# Patient Record
Sex: Male | Born: 1950
Health system: Southern US, Community
[De-identification: ages and names within clinical notes are randomized; demographics above are authoritative.]

## PROBLEM LIST (undated history)

## (undated) DIAGNOSIS — I10 Essential (primary) hypertension: Secondary | ICD-10-CM

## (undated) DIAGNOSIS — M1990 Unspecified osteoarthritis, unspecified site: Secondary | ICD-10-CM

## (undated) DIAGNOSIS — R911 Solitary pulmonary nodule: Secondary | ICD-10-CM

## (undated) DIAGNOSIS — E7800 Pure hypercholesterolemia, unspecified: Secondary | ICD-10-CM

## (undated) DIAGNOSIS — Z860101 Personal history of adenomatous and serrated colon polyps: Secondary | ICD-10-CM

## (undated) DIAGNOSIS — Z86010 Personal history of colonic polyps: Secondary | ICD-10-CM

## (undated) DIAGNOSIS — K5909 Other constipation: Secondary | ICD-10-CM

## (undated) DIAGNOSIS — N529 Male erectile dysfunction, unspecified: Secondary | ICD-10-CM

## (undated) DIAGNOSIS — Z8619 Personal history of other infectious and parasitic diseases: Secondary | ICD-10-CM

## (undated) DIAGNOSIS — R7303 Prediabetes: Secondary | ICD-10-CM

## (undated) DIAGNOSIS — Z960 Presence of urogenital implants: Secondary | ICD-10-CM

## (undated) DIAGNOSIS — K219 Gastro-esophageal reflux disease without esophagitis: Secondary | ICD-10-CM

## (undated) DIAGNOSIS — K449 Diaphragmatic hernia without obstruction or gangrene: Secondary | ICD-10-CM

## (undated) DIAGNOSIS — I700 Atherosclerosis of aorta: Secondary | ICD-10-CM

## (undated) DIAGNOSIS — K269 Duodenal ulcer, unspecified as acute or chronic, without hemorrhage or perforation: Secondary | ICD-10-CM

## (undated) DIAGNOSIS — N400 Enlarged prostate without lower urinary tract symptoms: Secondary | ICD-10-CM

## (undated) DIAGNOSIS — R339 Retention of urine, unspecified: Secondary | ICD-10-CM

## (undated) DIAGNOSIS — R002 Palpitations: Secondary | ICD-10-CM

## (undated) DIAGNOSIS — L719 Rosacea, unspecified: Secondary | ICD-10-CM

## (undated) DIAGNOSIS — F32A Depression, unspecified: Secondary | ICD-10-CM

## (undated) DIAGNOSIS — Z978 Presence of other specified devices: Secondary | ICD-10-CM

## (undated) DIAGNOSIS — F419 Anxiety disorder, unspecified: Secondary | ICD-10-CM

## (undated) DIAGNOSIS — I340 Nonrheumatic mitral (valve) insufficiency: Secondary | ICD-10-CM

## (undated) DIAGNOSIS — F1910 Other psychoactive substance abuse, uncomplicated: Secondary | ICD-10-CM

## (undated) DIAGNOSIS — K222 Esophageal obstruction: Secondary | ICD-10-CM

## (undated) DIAGNOSIS — I861 Scrotal varices: Secondary | ICD-10-CM

## (undated) DIAGNOSIS — F1020 Alcohol dependence, uncomplicated: Secondary | ICD-10-CM

## (undated) DIAGNOSIS — R0600 Dyspnea, unspecified: Secondary | ICD-10-CM

---

## 2000-12-24 ENCOUNTER — Ambulatory Visit (HOSPITAL_COMMUNITY): Admission: RE | Admit: 2000-12-24 | Discharge: 2000-12-24 | Payer: Self-pay | Admitting: Family Medicine

## 2000-12-24 ENCOUNTER — Encounter: Payer: Self-pay | Admitting: Family Medicine

## 2002-08-23 ENCOUNTER — Ambulatory Visit (HOSPITAL_BASED_OUTPATIENT_CLINIC_OR_DEPARTMENT_OTHER): Admission: RE | Admit: 2002-08-23 | Discharge: 2002-08-23 | Payer: Self-pay | Admitting: Family Medicine

## 2003-06-18 ENCOUNTER — Encounter (INDEPENDENT_AMBULATORY_CARE_PROVIDER_SITE_OTHER): Payer: Self-pay | Admitting: Specialist

## 2003-06-18 ENCOUNTER — Ambulatory Visit (HOSPITAL_COMMUNITY): Admission: RE | Admit: 2003-06-18 | Discharge: 2003-06-18 | Payer: Self-pay | Admitting: Gastroenterology

## 2006-11-21 ENCOUNTER — Ambulatory Visit (HOSPITAL_COMMUNITY): Admission: RE | Admit: 2006-11-21 | Discharge: 2006-11-21 | Payer: Self-pay | Admitting: Gastroenterology

## 2006-11-21 ENCOUNTER — Encounter (INDEPENDENT_AMBULATORY_CARE_PROVIDER_SITE_OTHER): Payer: Self-pay | Admitting: Gastroenterology

## 2008-08-25 ENCOUNTER — Encounter: Admission: RE | Admit: 2008-08-25 | Discharge: 2008-09-14 | Payer: Self-pay | Admitting: Family Medicine

## 2008-11-11 ENCOUNTER — Ambulatory Visit (HOSPITAL_COMMUNITY): Admission: RE | Admit: 2008-11-11 | Discharge: 2008-11-11 | Payer: Self-pay | Admitting: Gastroenterology

## 2008-11-11 ENCOUNTER — Encounter (INDEPENDENT_AMBULATORY_CARE_PROVIDER_SITE_OTHER): Payer: Self-pay | Admitting: Gastroenterology

## 2009-02-23 ENCOUNTER — Encounter: Admission: RE | Admit: 2009-02-23 | Discharge: 2009-04-07 | Payer: Self-pay | Admitting: Family Medicine

## 2009-03-19 ENCOUNTER — Encounter: Admission: RE | Admit: 2009-03-19 | Discharge: 2009-03-19 | Payer: Self-pay | Admitting: Family Medicine

## 2010-02-15 ENCOUNTER — Encounter: Admission: RE | Admit: 2010-02-15 | Discharge: 2010-02-15 | Payer: Self-pay | Admitting: Otolaryngology

## 2010-08-23 NOTE — Op Note (Signed)
NAME:  Ruben [name], Ruben [name] NO.:  [id number]   MEDICAL RECORD NO.:  [id number]          PATIENT TYPE:  AMB   LOCATION:  ENDO                         FACILITY:  [facility]   PHYSICIAN:  Danise Edge, M.D.   DATE OF BIRTH:  1950-12-07   DATE OF PROCEDURE:  11/11/2008  DATE OF DISCHARGE:                               OPERATIVE REPORT   REFERRING PHYSICIAN:  Vikki Ports, MD   HISTORY:  Mr. Ruben [name] is a 60 year old male born on 01/20/1951.  Mr. Ruben [name] has chronic gastroesophageal reflux and takes omeprazole each  morning.  He denies heartburn but does experience regurgitation.   In April 1995, his barium esophagram revealed a moderately large sliding  hiatal hernia and distal esophageal Schatzki ring.   In May 1995, his esophagogastroduodenoscopy revealed a Schatzki ring at  the esophagogastric junction which was dilated with the 18-mm Savary  dilator.  A healing 3-mm ulcer was present in the distal duodenal bulb.  His test for H. pylori gastritis was positive.  He received Pepto-  Bismol, tetracycline, and metronidazole to eradicate the H. pylori  gastritis.   In May 2005, the patient underwent a screening colonoscopy with removal  of a 2-mm tubular adenomatous polyp from the colon.   In August 2008, the patient underwent an esophagogastroduodenoscopy  which revealed a nonobstructing Schatzki ring at the esophagogastric  junction, a large hiatal hernia, and the gastric biopsies which were  positive for H. pylori gastritis.  He was treated with Protonix,  amoxicillin, and Biaxin.   The patient has redeveloped esophageal dysphagia, but denies heartburn.   CURRENT MEDICATION:  1. Benicar hydrochlorothiazide.  2. Prilosec 20 mg.   HABITS:  The patient does not smoke cigarettes.   MEDICATIONS ALLERGIES:  None.   PAST MEDICAL HISTORY:  1. Gastroesophageal reflux disease associated with hiatal hernia and      Schatzki ring.  2. Hypertension.   ENDOSCOPIST:  Danise Edge, MD   PREMEDICATION:  Versed 10 mg and fentanyl 100 mcg.   PROCEDURE IN DETAIL:  Esophagogastroduodenoscopy with Savary esophageal  dilation:  After obtaining informed consent, the patient was placed in  the left lateral decubitus position.  I administered intravenous  fentanyl and intravenous Versed to achieve conscious sedation for the  procedure.  The patient's blood pressure, oxygen saturation, and cardiac  rhythm were monitored throughout the procedure and documented in the  medical record.   The Pentax gastroscope was passed through the posterior hypopharynx into  the proximal esophagus without difficulty.  I did not visualize the  vocal cords.   Esophagoscopy:  The proximal mid and lower segments of the esophageal  mucosa appeared normal.  There is a shallow Schatzki ring at the  esophagogastric junction noted at approximately 35 cm from the incisor  teeth.  There is no endoscopic evidence for the presence of erosive  esophagitis or Barrett esophagus.   Gastroscopy:  There is a moderate-sized hiatal hernia.  Retroflex view  of the gastric cardia and fundus was normal.  The diaphragmatic hiatus  was quite patulous.  The gastric body, antrum,  and pylorus appeared  normal.  Biopsies were taken from the gastric antrum, gastric body, and  gastric cardia to look for H. pylori gastritis.  The pylorus is patent.   Duodenoscopy:  The duodenal bulb, second portion of duodenum, and third  portion of duodenum appeared normal.   Savary esophageal dilation:  The Savary dilator wire was passed through  the gastroscope and tip of the guidewire advanced to the distal gastric  antrum as confirmed endoscopically.  Fluoroscopy was not required for  esophageal dilation.  The 16-mm Savary dilator was passed without  resistance.  Repeat esophagogastroscopy revealed no mucosal tear of the  Schatzki ring at the esophagogastric junction and no gastric trauma due  to the  guidewire.   ASSESSMENT:  1. Gastroesophageal reflux disease associated with a moderate-sized      hiatal hernia and shallow Schatzki ring at the esophagogastric      junction.  No endoscopic evidence for the presence of erosive      esophagitis or Barrett esophagus.  2. Gastric biopsy to rule out Helicobacter pylori gastritis, pending.   SURVEILLANCE COLONOSCOPY WITH POLYPECTOMY TO PREVENT COLON CANCER:  Anal  inspection was normal.  Digital rectal exam revealed an enlarged  prostate.  The Pentax pediatric colonoscope was introduced into the  rectum and advanced to the cecum.  The patient had a colonic loop  formation making the procedure technically difficult.  In order to  intubate the cecum, the patient was rolled from the left lateral  decubitus position to the supine position with application of external  abdominal pressure.  Colonic preparation for the exam today was good.   Rectum normal.  Retroflex view of the distal rectum normal.  Sigmoid colon and descending colon.  At 50 cm from the anal verge, a 3-  mm sessile polyp was removed with cold biopsy forceps.  Splenic flexure normal.  Transverse colon normal.  Hepatic flexure normal.  Ascending colon.  From the proximal ascending colon, a 3-mm sessile  polyp was removed with cold biopsy forceps.  Cecum and ileocecal valve normal.   ASSESSMENT:  1. A 3-mm polyp was removed from the proximal ascending colon.  2. A 3-mm polyp was removed from the sigmoid colon at 50 cm from the      anal verge.  3. Enlarged prostate.   RECOMMENDATIONS:  Repeat surveillance colonoscopy in 5 years.           ______________________________  Danise Edge, M.D.     MJ/MEDQ  D:  11/11/2008  T:  11/12/2008  Job:  [id number]   cc:   Vikki Ports, M.D.

## 2010-08-23 NOTE — Op Note (Signed)
NAME:  Ruben [name], BART NO.:  [id number]   MEDICAL RECORD NO.:  [id number]          PATIENT TYPE:  AMB   LOCATION:  ENDO                         FACILITY:  [facility]   PHYSICIAN:  Danise Edge, M.D.   DATE OF BIRTH:  03-21-1951   DATE OF PROCEDURE:  11/21/2006  DATE OF DISCHARGE:                               OPERATIVE REPORT   PROCEDURE INDICATIONS:  Mr. ADOLPH CLUTTER is a 60 year old male born  May 25, 1950.  Mr. Helfand has chronic gastroesophageal reflux.  He takes  Prilosec each morning, which successfully controls his heartburn but  does not control his regurgitation.   His barium esophagram July 19, 1993 revealed a moderately large sliding  hiatal hernia and distal esophageal Schatzki's ring.   On Aug 09, 1993 his esophagogastroduodenoscopy revealed a Schatzki's ring  at the esophagogastric junction which was dilated with the 18-mm Savary  dilator.  A healing 3 mm ulcer was present in the distal duodenal bulb.  His CLO-test was positive for H pylori.  He received Pepto-Bismol,  tetracycline and Flagyl with his H2 blocker to eradicate H pylori  gastritis.   In March 2005 his screening proctocolonoscopy to the cecum resulted in  the removal of a 2-mm tubular adenomatous polyp from the left colon.   Mr. Ridley denies dysphagia and his heartburn is well controlled on  Prilosec.  He does experience regurgitation.   CHRONIC MEDICATIONS:  Prilosec, Benicar, hydrochlorothiazide.   MEDICATION ALLERGIES:  None.   PAST MEDICAL HISTORY:  Hypertension, gastroesophageal reflux   ENDOSCOPIST:  Danise Edge, M.D.   PREMEDICATION:  Fentanyl 75 mcg, Versed 6 mg.   PROCEDURE:  After obtaining informed consent Mr. Shippy was placed in the  left lateral decubitus position.  I administered intravenous fentanyl  and intravenous Versed to achieve conscious sedation for the procedure.  The patient's blood pressure, oxygen saturation and cardiac rhythm were  monitored  throughout the procedure and documented in the medical record.   The Pentax gastroscope was passed through the posterior hypopharynx into  the proximal esophagus without difficulty.  The hypopharynx, larynx and  vocal cords appeared normal.   Esophagoscopy:  The proximal mid and lower segments of the esophageal  mucosa appeared normal except for the presence of a non obstructing  Schatzki's ring at the esophagogastric junction which is noted at 37 cm  from the incisor teeth.  There is no endoscopic evidence for the  presence of Barrett's esophagus.   Gastroscopy:  Mr. Spellman has a large hiatal hernia.  Retroflex view of the  gastric cardia and fundus was normal.  The gastric body, antrum and  pylorus appeared normal.  Gastric biopsies were performed to rule out H  pylori gastritis.   Duodenoscopy:  The duodenal bulb, second portion of duodenum and third  portion of duodenum were normal.   Savary esophageal dilation:  The [employer] CRE esophageal  balloon dilating catheter was placed through the gastroscope.  Dilation  was accomplished with the 12-mm, 13.5-mm and 15-mm through the scope  balloon dilation.  ______________________________  Danise Edge, M.D.     MJ/MEDQ  D:  11/21/2006  T:  11/22/2006  Job:  [id number]   cc:   Vikki Ports, M.D.  Fax: [phone]

## 2011-06-24 DIAGNOSIS — L719 Rosacea, unspecified: Secondary | ICD-10-CM | POA: Insufficient documentation

## 2011-08-30 ENCOUNTER — Other Ambulatory Visit: Payer: Self-pay | Admitting: Gastroenterology

## 2012-12-16 ENCOUNTER — Other Ambulatory Visit: Payer: Self-pay | Admitting: Gastroenterology

## 2012-12-31 ENCOUNTER — Encounter (HOSPITAL_COMMUNITY)
Admission: RE | Disposition: A | Discharge status: Home / Self Care | Payer: Self-pay | Source: Ambulatory Visit | Attending: Gastroenterology

## 2012-12-31 ENCOUNTER — Ambulatory Visit (HOSPITAL_COMMUNITY)
Admission: RE | Admit: 2012-12-31 | Discharge: 2012-12-31 | Disposition: A | Discharge status: Home / Self Care | Payer: BC Managed Care – PPO | Source: Ambulatory Visit | Attending: Gastroenterology | Admitting: Gastroenterology

## 2012-12-31 ENCOUNTER — Encounter (HOSPITAL_COMMUNITY): Payer: Self-pay

## 2012-12-31 DIAGNOSIS — K219 Gastro-esophageal reflux disease without esophagitis: Secondary | ICD-10-CM | POA: Insufficient documentation

## 2012-12-31 DIAGNOSIS — I10 Essential (primary) hypertension: Secondary | ICD-10-CM | POA: Insufficient documentation

## 2012-12-31 DIAGNOSIS — R1310 Dysphagia, unspecified: Secondary | ICD-10-CM | POA: Insufficient documentation

## 2012-12-31 DIAGNOSIS — K222 Esophageal obstruction: Secondary | ICD-10-CM | POA: Insufficient documentation

## 2012-12-31 DIAGNOSIS — K449 Diaphragmatic hernia without obstruction or gangrene: Secondary | ICD-10-CM | POA: Insufficient documentation

## 2012-12-31 HISTORY — PX: ESOPHAGOGASTRODUODENOSCOPY (EGD) WITH ESOPHAGEAL DILATION: SHX5812

## 2012-12-31 HISTORY — DX: Essential (primary) hypertension: I10

## 2012-12-31 SURGERY — ESOPHAGOGASTRODUODENOSCOPY (EGD) WITH ESOPHAGEAL DILATION
Anesthesia: Moderate Sedation

## 2012-12-31 MED ORDER — SODIUM CHLORIDE 0.9 % IV SOLN
INTRAVENOUS | Status: DC
  Administered 2012-12-31: 500 mL via INTRAVENOUS

## 2012-12-31 MED ORDER — FENTANYL CITRATE 0.05 MG/ML IJ SOLN
INTRAMUSCULAR | Status: DC | PRN
  Administered 2012-12-31: 50 ug via INTRAVENOUS
  Administered 2012-12-31: 25 ug via INTRAVENOUS

## 2012-12-31 MED ORDER — FENTANYL CITRATE 0.05 MG/ML IJ SOLN
INTRAMUSCULAR | Status: AC
  Filled 2012-12-31: qty 2

## 2012-12-31 MED ORDER — MIDAZOLAM HCL 10 MG/2ML IJ SOLN
INTRAMUSCULAR | Status: DC | PRN
  Administered 2012-12-31 (×3): 2.5 mg via INTRAVENOUS

## 2012-12-31 MED ORDER — BUTAMBEN-TETRACAINE-BENZOCAINE 2-2-14 % EX AERO
INHALATION_SPRAY | CUTANEOUS | Status: DC | PRN
  Administered 2012-12-31: 2 via TOPICAL

## 2012-12-31 MED ORDER — MIDAZOLAM HCL 10 MG/2ML IJ SOLN
INTRAMUSCULAR | Status: AC
  Filled 2012-12-31: qty 2

## 2012-12-31 NOTE — Op Note (Signed)
Problem: Chronic esophageal dysphagia  Endoscopist: Danise Edge  Premedication: Versed 7.5 mg intravenously. Fentanyl 75 mcg intravenously.  Procedure: Diagnostic esophagogastroduodenoscopy with balloon dilation of a benign distal esophageal stricture The patient was placed in the left lateral decubitus position. The Pentax gastroscope was passed through the posterior hypopharynx into the proximal esophagus without difficulty. The vocal cords were not visualized.  Esophagoscopy: The proximal and mid segments of the esophageal mucosa appeared normal. The esophagogastric junction wsa noted at 40 cm from the incisor teeth. There was a shallow benign stricture at the esophagogastric junction less than 1 cm in length. Using the esophageal balloon dilator, the stricture was dilated to 18 mm without apparent complications. There was no endoscopic evidence for the presence of erosive esophagitis or Barrett's esophagus.  Gastroscopy: There was a moderate-sized hiatal hernia. Retroflexed view of the gastric cardia and fundus was normal. The gastric body, antrum, and pylorus appeared normal. There was a small amount of solid food in the gastric body indicating gastroparesis.  Duodenoscopy: The duodenal bulb and descending duodenum appeared normal.  Assessment: Chronic gastroesophageal reflux associated with a moderate-sized hiatal hernia And benign stricture at the esophagogastric junction dilated to 18 mm using the esophageal balloon dilator.  Recommendations: If esophageal dysphagia persists post esophageal dilation, scheduled diagnostic esophageal manometry to rule out achalasia

## 2012-12-31 NOTE — H&P (Signed)
  Problem: Dysphagia secondary to a benign distal esophageal stricture  History: The patient is a 62 year old male born March 12, 1951. The patient has chronic esophageal dysphagia. In May 2013, his esophagogastroduodenoscopy showed a benign stricture at the esophagogastric junction which was dilated to 18 mm.  Despite esophageal dilation and chronic proton pump inhibitor therapy, the patient continues to experience intermittent solid food and liquid esophageal dysphagia without odynophagia.  Patient is scheduled to undergo a diagnostic esophagogastroduodenoscopy with dilation of the benign distal esophageal stricture.  Past medical history: Hypertension. Gastroesophageal reflux associated with a hiatal hernia and benign distal esophageal stricture. History of duodenal ulcer. Adenomatous colon polyp removed colonoscopically in 2010. Benign prostatic hypertrophy.  Exam: The patient is alert and lying comfortably on the endoscopy stretcher. Abdomen is soft and nontender to palpation. Lungs are clear to auscultation. Cardiac exam reveals a regular rhythm.  Plan: Proceed with diagnostic esophagogastroduodenoscopy and dilation of the benign distal esophageal stricture. If esophageal dysphagia persists following esophageal dilation, scheduled diagnostic esophageal manometry.

## 2013-01-01 ENCOUNTER — Encounter (HOSPITAL_COMMUNITY): Payer: Self-pay | Admitting: Gastroenterology

## 2014-02-23 ENCOUNTER — Other Ambulatory Visit: Payer: Self-pay | Admitting: Family Medicine

## 2014-02-23 DIAGNOSIS — N5089 Other specified disorders of the male genital organs: Secondary | ICD-10-CM

## 2014-02-24 ENCOUNTER — Ambulatory Visit
Admission: RE | Admit: 2014-02-24 | Discharge: 2014-02-24 | Disposition: A | Discharge status: Home / Self Care | Payer: BC Managed Care – PPO | Source: Ambulatory Visit | Attending: Family Medicine | Admitting: Family Medicine

## 2014-02-24 DIAGNOSIS — N5089 Other specified disorders of the male genital organs: Secondary | ICD-10-CM

## 2014-02-25 DIAGNOSIS — I861 Scrotal varices: Secondary | ICD-10-CM

## 2014-02-25 HISTORY — DX: Scrotal varices: I86.1

## 2014-06-03 ENCOUNTER — Other Ambulatory Visit: Payer: Self-pay | Admitting: Gastroenterology

## 2014-06-04 ENCOUNTER — Encounter (HOSPITAL_COMMUNITY): Payer: Self-pay | Admitting: *Deleted

## 2014-06-08 ENCOUNTER — Ambulatory Visit (HOSPITAL_COMMUNITY): Payer: BLUE CROSS/BLUE SHIELD | Admitting: Anesthesiology

## 2014-06-08 ENCOUNTER — Encounter (HOSPITAL_COMMUNITY)
Admission: RE | Disposition: A | Discharge status: Home / Self Care | Payer: Self-pay | Source: Ambulatory Visit | Attending: Gastroenterology

## 2014-06-08 ENCOUNTER — Ambulatory Visit (HOSPITAL_COMMUNITY)
Admission: RE | Admit: 2014-06-08 | Discharge: 2014-06-08 | Disposition: A | Discharge status: Home / Self Care | Payer: BLUE CROSS/BLUE SHIELD | Source: Ambulatory Visit | Attending: Gastroenterology | Admitting: Gastroenterology

## 2014-06-08 ENCOUNTER — Encounter (HOSPITAL_COMMUNITY): Payer: Self-pay | Admitting: Gastroenterology

## 2014-06-08 DIAGNOSIS — I10 Essential (primary) hypertension: Secondary | ICD-10-CM | POA: Diagnosis not present

## 2014-06-08 DIAGNOSIS — K449 Diaphragmatic hernia without obstruction or gangrene: Secondary | ICD-10-CM | POA: Insufficient documentation

## 2014-06-08 DIAGNOSIS — K219 Gastro-esophageal reflux disease without esophagitis: Secondary | ICD-10-CM | POA: Diagnosis not present

## 2014-06-08 DIAGNOSIS — Z1211 Encounter for screening for malignant neoplasm of colon: Secondary | ICD-10-CM | POA: Insufficient documentation

## 2014-06-08 DIAGNOSIS — K635 Polyp of colon: Secondary | ICD-10-CM | POA: Insufficient documentation

## 2014-06-08 DIAGNOSIS — K222 Esophageal obstruction: Secondary | ICD-10-CM | POA: Diagnosis present

## 2014-06-08 HISTORY — PX: COLONOSCOPY WITH PROPOFOL: SHX5780

## 2014-06-08 HISTORY — PX: ESOPHAGOGASTRODUODENOSCOPY: SHX5428

## 2014-06-08 HISTORY — PX: BALLOON DILATION: SHX5330

## 2014-06-08 SURGERY — EGD (ESOPHAGOGASTRODUODENOSCOPY)
Anesthesia: Monitor Anesthesia Care

## 2014-06-08 MED ORDER — LACTATED RINGERS IV SOLN
INTRAVENOUS | Status: DC
  Administered 2014-06-08: 1000 mL via INTRAVENOUS

## 2014-06-08 MED ORDER — SODIUM CHLORIDE 0.9 % IV SOLN: INTRAVENOUS | Status: DC

## 2014-06-08 MED ORDER — PROPOFOL 10 MG/ML IV BOLUS
INTRAVENOUS | Status: AC
  Filled 2014-06-08: qty 20

## 2014-06-08 MED ORDER — LACTATED RINGERS IV SOLN
INTRAVENOUS | Status: DC | PRN
  Administered 2014-06-08 (×2): via INTRAVENOUS

## 2014-06-08 MED ORDER — PROPOFOL 10 MG/ML IV BOLUS
INTRAVENOUS | Status: AC
  Filled 2014-06-08: qty 60

## 2014-06-08 MED ORDER — PROPOFOL 10 MG/ML IV BOLUS
INTRAVENOUS | Status: DC | PRN
  Administered 2014-06-08 (×2): 100 mg via INTRAVENOUS
  Administered 2014-06-08: 75 mg via INTRAVENOUS
  Administered 2014-06-08: 100 mg via INTRAVENOUS
  Administered 2014-06-08: 25 mg via INTRAVENOUS
  Administered 2014-06-08 (×2): 50 mg via INTRAVENOUS
  Administered 2014-06-08: 25 mg via INTRAVENOUS

## 2014-06-08 SURGICAL SUPPLY — 22 items
ELECT REM PT RETURN 9FT ADLT (ELECTROSURGICAL) IMPLANT
ELECTRODE REM PT RTRN 9FT ADLT (ELECTROSURGICAL) IMPLANT
FCP BXJMBJMB 240X2.8X (CUTTING FORCEPS)
FLOOR PAD 36X40 (MISCELLANEOUS) ×2 IMPLANT
FORCEPS BIOP RAD 4 LRG CAP 4 (CUTTING FORCEPS) IMPLANT
FORCEPS BIOP RJ4 240 W/NDL (CUTTING FORCEPS) IMPLANT
FORCEPS BXJMBJMB 240X2.8X (CUTTING FORCEPS) IMPLANT
INJECTOR/SNARE I SNARE (MISCELLANEOUS) IMPLANT
LUBRICANT JELLY 4.5OZ STERILE (MISCELLANEOUS) IMPLANT
MANIFOLD NEPTUNE II (INSTRUMENTS) IMPLANT
NDL SCLEROTHERAPY 25GX240 (NEEDLE) IMPLANT
NEEDLE SCLEROTHERAPY 25GX240 (NEEDLE) IMPLANT
PAD FLOOR 36X40 (MISCELLANEOUS) ×1 IMPLANT
PROBE APC STR FIRE (PROBE) IMPLANT
PROBE INJECTION GOLD (MISCELLANEOUS)
PROBE INJECTION GOLD 7FR (MISCELLANEOUS) IMPLANT
SNARE ROTATE MED OVAL 20MM (MISCELLANEOUS) IMPLANT
SYR 50ML LL SCALE MARK (SYRINGE) IMPLANT
TRAP SPECIMEN MUCOUS 40CC (MISCELLANEOUS) IMPLANT
TUBING ENDO SMARTCAP PENTAX (MISCELLANEOUS) IMPLANT
TUBING IRRIGATION ENDOGATOR (MISCELLANEOUS) ×2 IMPLANT
WATER STERILE IRR 1000ML POUR (IV SOLUTION) IMPLANT

## 2014-06-08 NOTE — Transfer of Care (Signed)
Immediate Anesthesia Transfer of Care Note  Patient: [name]  Procedure(s) Performed: Procedure(s): ESOPHAGOGASTRODUODENOSCOPY (EGD) (N/A) BALLOON DILATION (N/A) COLONOSCOPY WITH PROPOFOL (N/A)  Patient Location: PACU and Endoscopy Unit  Anesthesia Type:MAC  Level of Consciousness: awake, sedated and patient cooperative  Airway & Oxygen Therapy: Patient Spontanous Breathing and Patient connected to face mask oxygen  Post-op Assessment: Report given to RN and Post -op Vital signs reviewed and stable  Post vital signs: Reviewed and stable  Last Vitals:  Filed Vitals:   06/08/14 1252  BP: 154/41  Pulse: 95  Temp: 36.8 C  Resp: 11    Complications: No apparent anesthesia complications

## 2014-06-08 NOTE — H&P (Signed)
Problem: Benign distal esophageal stricture dilated in 2014. History of adenomatous colon polyps removed colonoscopically.  History: The patient is a 64 year old male born 10/13/1960. He is scheduled to undergo esophagogastroduodenoscopy with dilation of the benign distal esophageal stricture and a surveillance colonoscopy today.  Past medical history: Hypertension. Gastroesophageal reflux complicated by a benign distal esophageal stricture. Treated H. pylori gastritis. Adenomatous colon polyps removed colonoscopically in the past. Benign prostatic hypertrophy.  Exam: The patient is alert and lying comfortably on the endoscopy stretcher. Cardiac exam reveals a regular rhythm. Abdomen is soft and nontender to palpation. Lungs are clear to auscultation.  Plan: Proceed with esophagogastroduodenoscopy with benign distal esophageal stricture dilation and surveillance colonoscopy

## 2014-06-08 NOTE — Discharge Instructions (Signed)
Esophagogastroduodenoscopy °Care After °Refer to this sheet in the next few weeks. These instructions provide you with information on caring for yourself after your procedure. Your caregiver may also give you more specific instructions. Your treatment has been planned according to current medical practices, but problems sometimes occur. Call your caregiver if you have any problems or questions after your procedure.  °HOME CARE INSTRUCTIONS °· Do not eat or drink anything until the numbing medicine (local anesthetic) has worn off and your gag reflex has returned. You will know that the local anesthetic has worn off when you can swallow comfortably. °· Do not drive for 12 hours after the procedure or as directed by your caregiver. °· Only take medicines as directed by your caregiver. °SEEK MEDICAL CARE IF:  °· You cannot stop coughing. °· You are not urinating at all or less than usual. °SEEK IMMEDIATE MEDICAL CARE IF: °· You have difficulty swallowing. °· You cannot eat or drink. °· You have worsening throat or chest pain. °· You have dizziness, lightheadedness, or you faint. °· You have nausea or vomiting. °· You have chills. °· You have a fever. °· You have severe abdominal pain. °· You have black, tarry, or bloody stools. °Document Released: 03/13/2012 Document Reviewed: 03/13/2012 °[employer]® Patient Information ©2015 [employer]. This information is not intended to replace advice given to you by your health care provider. Make sure you discuss any questions you have with your health care provider. ° °

## 2014-06-08 NOTE — Anesthesia Postprocedure Evaluation (Signed)
  Anesthesia Post-op Note  Patient: Ruben [name]  Procedure(s) Performed: Procedure(s): ESOPHAGOGASTRODUODENOSCOPY (EGD) (N/A) BALLOON DILATION (N/A) COLONOSCOPY WITH PROPOFOL (N/A)  Patient Location: PACU  Anesthesia Type:MAC  Level of Consciousness: awake and alert   Airway and Oxygen Therapy: Patient Spontanous Breathing  Post-op Pain: none  Post-op Assessment: Post-op Vital signs reviewed  Post-op Vital Signs: Reviewed  Last Vitals:  Filed Vitals:   06/08/14 1440  BP: 152/84  Pulse: 89  Temp:   Resp: 17    Complications: No apparent anesthesia complications

## 2014-06-08 NOTE — Anesthesia Preprocedure Evaluation (Addendum)
Anesthesia Evaluation  Patient identified by MRN, date of birth, ID band Patient awake    Reviewed: Allergy & Precautions, NPO status , Patient's Chart, lab work & pertinent test results  Airway Mallampati: III  TM Distance: >3 FB Neck ROM: Full    Dental  (+) Teeth Intact   Pulmonary neg pulmonary ROS,  breath sounds clear to auscultation        Cardiovascular hypertension, Pt. on medications Rhythm:Regular Rate:Normal     Neuro/Psych negative neurological ROS     GI/Hepatic negative GI ROS, Neg liver ROS,   Endo/Other  negative endocrine ROS  Renal/GU negative Renal ROS     Musculoskeletal   Abdominal   Peds  Hematology negative hematology ROS (+)   Anesthesia Other Findings   Reproductive/Obstetrics                            Anesthesia Physical Anesthesia Plan  ASA: II  Anesthesia Plan: MAC   Post-op Pain Management:    Induction: Intravenous  Airway Management Planned: Natural Airway and Nasal Cannula  Additional Equipment:   Intra-op Plan:   Post-operative Plan:   Informed Consent: I have reviewed the patients History and Physical, chart, labs and discussed the procedure including the risks, benefits and alternatives for the proposed anesthesia with the patient or authorized representative who has indicated his/her understanding and acceptance.     Plan Discussed with: CRNA  Anesthesia Plan Comments:         Anesthesia Quick Evaluation

## 2014-06-08 NOTE — Op Note (Signed)
Problems: Benign distal esophageal stricture dilated in 2014. Giant hiatal hernia. History of adenomatous colon polyps removed colonoscopically.  Endoscopist: Earle Gell  Premedication: Propofol administered by anesthesia  Procedure: Diagnostic esophagogastroduodenoscopy The patient was placed in the left lateral decubitus position. The Pentax gastroscope was passed through the posterior hypopharynx into the proximal esophagus without difficulty. I did not visualize the vocal cords.  Esophagoscopy: The proximal, mid, and lower segments of the esophageal mucosa appeared normal. The squamocolumnar junction was noted at 40 cm from the incisor teeth. No definite stricture formation was noted. Using the esophageal balloon dilator, the balloon was inflated at the esophagogastric junction from 15 mm to  16.5 mm. Inspection of the mucosa at the esophagogastric junction after deflating balloon did not show esophageal mucosal disruption indicating the absence of a significant esophageal stricture.  Gastroscopy: The patient has a giant hiatal hernia. Retroflexed view of the gastric cardia and fundus was normal. The gastric body, antrum, and pylorus appeared normal. There were no gastric erosions at the diaphragmatic hiatus which was noted at the mid gastric antrum.  Duodenoscopy: The duodenal bulb and descending duodenum appeared normal.  Assessment: Normal esophagogastroduodenoscopy except for the presence of a giant hiatal hernia  Procedure: Screening colonoscopy. Anal inspection and digital rectal exam were normal. The Pentax pediatric colonoscope was introduced into the rectum and advanced to the cecum. A normal-appearing appendiceal orifice was identified. A normal-appearing ileocecal valve was identified. Colonic preparation for the exam was good after vigorous water lavage of the colonic mucosa. Withdrawal time was 18 minutes  Rectum. Normal. Retroflexed view of the distal rectum  normal  Sigmoid colon and descending colon. Normal  Splenic flexure. Normal  Transverse colon. Normal  Hepatic flexure. Normal  Ascending colon. A 3 mm sessile polyp was removed from the proximal ascending colon with the cold biopsy forceps  Cecum and ileocecal valve. Normal  Assessment: A 3 mm sessile polyp was removed from the proximal ascending colon. Otherwise normal colonoscopy.  Recommendation: Schedule surveillance colonoscopy in 5 years

## 2014-06-09 ENCOUNTER — Encounter (HOSPITAL_COMMUNITY): Payer: Self-pay | Admitting: Gastroenterology

## 2015-08-19 DIAGNOSIS — R918 Other nonspecific abnormal finding of lung field: Secondary | ICD-10-CM | POA: Diagnosis not present

## 2015-08-19 DIAGNOSIS — J069 Acute upper respiratory infection, unspecified: Secondary | ICD-10-CM | POA: Diagnosis not present

## 2015-11-28 ENCOUNTER — Encounter (HOSPITAL_BASED_OUTPATIENT_CLINIC_OR_DEPARTMENT_OTHER): Payer: Self-pay | Admitting: *Deleted

## 2015-11-28 ENCOUNTER — Emergency Department (HOSPITAL_BASED_OUTPATIENT_CLINIC_OR_DEPARTMENT_OTHER)
Admission: EM | Admit: 2015-11-28 | Discharge: 2015-11-28 | Disposition: A | Discharge status: Home / Self Care | Payer: Managed Care, Other (non HMO) | Attending: Emergency Medicine | Admitting: Emergency Medicine

## 2015-11-28 DIAGNOSIS — R509 Fever, unspecified: Secondary | ICD-10-CM | POA: Diagnosis present

## 2015-11-28 DIAGNOSIS — E871 Hypo-osmolality and hyponatremia: Secondary | ICD-10-CM | POA: Diagnosis not present

## 2015-11-28 DIAGNOSIS — Z79899 Other long term (current) drug therapy: Secondary | ICD-10-CM | POA: Insufficient documentation

## 2015-11-28 DIAGNOSIS — I10 Essential (primary) hypertension: Secondary | ICD-10-CM | POA: Diagnosis not present

## 2015-11-28 DIAGNOSIS — N390 Urinary tract infection, site not specified: Secondary | ICD-10-CM | POA: Diagnosis not present

## 2015-11-28 LAB — COMPREHENSIVE METABOLIC PANEL
ALT: 23 U/L (ref 17–63)
ANION GAP: 8 (ref 5–15)
AST: 23 U/L (ref 15–41)
Albumin: 3.5 g/dL (ref 3.5–5.0)
Alkaline Phosphatase: 54 U/L (ref 38–126)
BUN: 26 mg/dL — ABNORMAL HIGH (ref 6–20)
CHLORIDE: 99 mmol/L — AB (ref 101–111)
CO2: 22 mmol/L (ref 22–32)
Calcium: 8.3 mg/dL — ABNORMAL LOW (ref 8.9–10.3)
Creatinine, Ser: 1.32 mg/dL — ABNORMAL HIGH (ref 0.61–1.24)
GFR, EST NON AFRICAN AMERICAN: 55 mL/min — AB (ref >60)
Glucose, Bld: 132 mg/dL — ABNORMAL HIGH (ref 65–99)
POTASSIUM: 4 mmol/L (ref 3.5–5.1)
Sodium: 129 mmol/L — ABNORMAL LOW (ref 135–145)
Total Bilirubin: 0.8 mg/dL (ref 0.3–1.2)
Total Protein: 7 g/dL (ref 6.5–8.1)

## 2015-11-28 LAB — URINE MICROSCOPIC-ADD ON

## 2015-11-28 LAB — URINALYSIS, ROUTINE W REFLEX MICROSCOPIC
BILIRUBIN URINE: NEGATIVE
Glucose, UA: NEGATIVE mg/dL
Ketones, ur: NEGATIVE mg/dL
Nitrite: POSITIVE — AB
PROTEIN: 100 mg/dL — AB
Specific Gravity, Urine: 1.022 (ref 1.005–1.030)
pH: 5.5 (ref 5.0–8.0)

## 2015-11-28 LAB — CBC WITH DIFFERENTIAL/PLATELET
Basophils Absolute: 0 10*3/uL (ref 0.0–0.1)
Basophils Relative: 0 %
Eosinophils Absolute: 0.1 10*3/uL (ref 0.0–0.7)
Eosinophils Relative: 1 %
HEMATOCRIT: 40 % (ref 39.0–52.0)
HEMOGLOBIN: 14.1 g/dL (ref 13.0–17.0)
LYMPHS ABS: 0.2 10*3/uL — AB (ref 0.7–4.0)
Lymphocytes Relative: 2 %
MCH: 31.3 pg (ref 26.0–34.0)
MCHC: 35.3 g/dL (ref 30.0–36.0)
MCV: 88.9 fL (ref 78.0–100.0)
MONOS PCT: 11 %
Monocytes Absolute: 1.2 10*3/uL — ABNORMAL HIGH (ref 0.1–1.0)
NEUTROS ABS: 9.3 10*3/uL — AB (ref 1.7–7.7)
NEUTROS PCT: 86 %
Platelets: 175 10*3/uL (ref 150–400)
RBC: 4.5 MIL/uL (ref 4.22–5.81)
RDW: 12.7 % (ref 11.5–15.5)
WBC: 10.8 10*3/uL — AB (ref 4.0–10.5)

## 2015-11-28 LAB — I-STAT CG4 LACTIC ACID, ED: LACTIC ACID, VENOUS: 1.15 mmol/L (ref 0.5–1.9)

## 2015-11-28 MED ORDER — CIPROFLOXACIN HCL 500 MG PO TABS: 500.0000 mg | ORAL_TABLET | Freq: Two times a day (BID) | ORAL | 0 refills | Status: DC

## 2015-11-28 MED ORDER — CEFTRIAXONE SODIUM 1 G IJ SOLR
1.0000 g | Freq: Once | INTRAMUSCULAR | Status: AC
  Administered 2015-11-28: 1 g via INTRAVENOUS
  Filled 2015-11-28: qty 10

## 2015-11-28 MED ORDER — ACETAMINOPHEN 325 MG PO TABS
650.0000 mg | ORAL_TABLET | Freq: Once | ORAL | Status: AC
  Administered 2015-11-28: 650 mg via ORAL
  Filled 2015-11-28: qty 2

## 2015-11-28 MED ORDER — SODIUM CHLORIDE 0.9 % IV BOLUS (SEPSIS)
500.0000 mL | Freq: Once | INTRAVENOUS | Status: AC
  Administered 2015-11-28: 500 mL via INTRAVENOUS

## 2015-11-28 NOTE — ED Notes (Signed)
Pt given d/c instructions. Rx x 1 Verbalizes understanding. No questions. 

## 2015-11-28 NOTE — ED Provider Notes (Signed)
Ruben [name] DEPT Provider Note   CSN: [id number] Arrival date & time: 11/28/15  1620 By signing my name below, I, Ruben [name], attest that this documentation has been prepared under the direction and in the presence of No att. providers found . Electronically Signed: Dyke [name], Scribe. 11/28/2015. 6:43 PM.    History   Chief Complaint Chief Complaint  Patient presents with  . Fever    HPI [name] is a 65 y.o. male with hx of enlarged prostate and HTN who presents to the Emergency Department complaining of fever which he developed this morning. He also complains of mild, burning penile pain. Pt had catheter placed about 4 weeks ago because he was unable to empty his bladder. His catheter was last changed three days ago. He is currently being followed at [facility] urology and states he is to have surgery to correct his prostate. Pt denies any severe pain.  The history is provided by the patient. No language interpreter was used.    Past Medical History:  Diagnosis Date  . Enlarged prostate   . Hypertension     There are no active problems to display for this patient.   Past Surgical History:  Procedure Laterality Date  . BALLOON DILATION N/A 06/08/2014   Procedure: BALLOON DILATION;  Surgeon: Garlan Fair, MD;  Location: [facility];  Service: Endoscopy;  Laterality: N/A;  . COLONOSCOPY WITH PROPOFOL N/A 06/08/2014   Procedure: COLONOSCOPY WITH PROPOFOL;  Surgeon: Garlan Fair, MD;  Location: [facility];  Service: Endoscopy;  Laterality: N/A;  . ESOPHAGOGASTRODUODENOSCOPY N/A 06/08/2014   Procedure: ESOPHAGOGASTRODUODENOSCOPY (EGD);  Surgeon: Garlan Fair, MD;  Location: [facility];  Service: Endoscopy;  Laterality: N/A;  . ESOPHAGOGASTRODUODENOSCOPY (EGD) WITH ESOPHAGEAL DILATION N/A 12/31/2012   Procedure: ESOPHAGOGASTRODUODENOSCOPY (EGD) WITH ESOPHAGEAL DILATION;  Surgeon: Garlan Fair, MD;  Location: [facility];  Service: Endoscopy;   Laterality: N/A;     Home Medications    Prior to Admission medications   Medication Sig Start Date End Date Taking? Authorizing Provider  Calcium-Magnesium-Vitamin D (CALCIUM MAGNESIUM PO) Take 1 tablet by mouth daily.    Historical Provider, MD  ciprofloxacin (CIPRO) 500 MG tablet Take 1 tablet (500 mg total) by mouth every 12 (twelve) hours. 11/28/15   [name] Schwartz, MD  ibuprofen (ADVIL,MOTRIN) 200 MG tablet Take 400 mg by mouth every 6 (six) hours as needed for headache or moderate pain.    Historical Provider, MD  lisinopril-hydrochlorothiazide (PRINZIDE,ZESTORETIC) 20-12.5 MG per tablet Take 1 tablet by mouth daily.    Historical Provider, MD  MILK THISTLE PO Take 1 tablet by mouth daily.    Historical Provider, MD  omeprazole (PRILOSEC) 20 MG capsule Take 20 mg by mouth daily.    Historical Provider, MD  UBIQUINOL PO Take 1 tablet by mouth daily.    Historical Provider, MD    Family History History reviewed. No pertinent family history.  Social History Social History  Substance Use Topics  . Smoking status: Never Smoker  . Smokeless tobacco: Never Used  . Alcohol use Yes    Allergies   Review of patient's allergies indicates no known allergies.  Review of Systems Review of Systems 10 Systems reviewed and are negative for acute change except as noted in the HPI.   Physical Exam Updated Vital Signs BP 112/79 (BP Location: Left Arm)   Pulse 102   Temp 99.1 F (37.3 C)   Resp 20   Ht 5\' 11"  (1.803 m)   Wt 186  lb (84.4 kg)   SpO2 96%   BMI 25.94 kg/m   Physical Exam Physical Exam  Nursing note and vitals reviewed. Constitutional: He is oriented to person, place, and time. He appears well-developed and well-nourished. No distress.  HENT:  Head: Normocephalic and atraumatic.  Eyes: Pupils are equal, round, and reactive to light.  Neck: Normal range of motion.  Cardiovascular: Normal rate and intact distal pulses.   Pulmonary/Chest: No respiratory distress.    Abdominal: Normal appearance. He exhibits no distension.  Musculoskeletal: Normal range of motion.  Neurological: He is alert and oriented to person, place, and time. No cranial nerve deficit.  Skin: Skin is warm and dry. No rash noted.  Psychiatric: He has a normal mood and affect. His behavior is normal.   ED Treatments / Results  DIAGNOSTIC STUDIES:  Oxygen Saturation is 95% on RA, normal by my interpretation.    COORDINATION OF CARE:  6:37 PM Will start rocephin. Discussed treatment plan with pt at bedside and pt agreed to plan.   Labs (all labs ordered are listed, but only abnormal results are displayed) Labs Reviewed  URINE CULTURE - Abnormal; Notable for the following:       Result Value   Culture   (*)    Value: >=100,000 COLONIES/mL ESCHERICHIA COLI 70,000 COLONIES/mL PSEUDOMONAS AERUGINOSA    Organism ID, Bacteria ESCHERICHIA COLI (*)    Organism ID, Bacteria PSEUDOMONAS AERUGINOSA (*)    All other components within normal limits  URINALYSIS, ROUTINE W REFLEX MICROSCOPIC (NOT AT [facility]) - Abnormal; Notable for the following:    Color, Urine ORANGE (*)    APPearance CLOUDY (*)    Hgb urine dipstick MODERATE (*)    Protein, ur 100 (*)    Nitrite POSITIVE (*)    Leukocytes, UA MODERATE (*)    All other components within normal limits  COMPREHENSIVE METABOLIC PANEL - Abnormal; Notable for the following:    Sodium 129 (*)    Chloride 99 (*)    Glucose, Bld 132 (*)    BUN 26 (*)    Creatinine, Ser 1.32 (*)    Calcium 8.3 (*)    GFR calc non Af Amer 55 (*)    All other components within normal limits  CBC WITH DIFFERENTIAL/PLATELET - Abnormal; Notable for the following:    WBC 10.8 (*)    Neutro Abs 9.3 (*)    Lymphs Abs 0.2 (*)    Monocytes Absolute 1.2 (*)    All other components within normal limits  URINE MICROSCOPIC-ADD ON - Abnormal; Notable for the following:    Squamous Epithelial / LPF 0-5 (*)    Bacteria, UA MANY (*)    All other components within  normal limits  CULTURE, BLOOD (ROUTINE X 2)  CULTURE, BLOOD (ROUTINE X 2)  I-STAT CG4 LACTIC ACID, ED    EKG  EKG Interpretation None       Radiology No results found.  Procedures Procedures (including critical care time)  Medications Ordered in ED Medications  acetaminophen (TYLENOL) tablet 650 mg (650 mg Oral Given 11/28/15 1656)  cefTRIAXone (ROCEPHIN) 1 g in dextrose 5 % 50 mL IVPB (0 g Intravenous Stopped 11/28/15 1910)  sodium chloride 0.9 % bolus 500 mL (0 mLs Intravenous Stopped 11/28/15 1935)    Initial Impression / Assessment and Plan / ED Course  I have reviewed the triage vital signs and the nursing notes.  Pertinent labs & imaging results that were available during my care of the patient  were reviewed by me and considered in my medical decision making (see chart for details).  Clinical Course     Final Clinical Impressions(s) / ED Diagnoses   Final diagnoses:  UTI (lower urinary tract infection)  Hyponatremia  I personally performed the services described in this documentation, which was scribed in my presence. The recorded information has been reviewed and considered.   New Prescriptions Discharge Medication List as of 11/28/2015  7:10 PM    START taking these medications   Details  ciprofloxacin (CIPRO) 500 MG tablet Take 1 tablet (500 mg total) by mouth every 12 (twelve) hours., Starting Sun 11/28/2015, Print         [name] Schwartz, MD 12/04/15 [phone]

## 2015-11-28 NOTE — ED Triage Notes (Signed)
Pt has had cath x 3-4 weeks and last night developed fever, chills and sweats. Feel woozy at times.

## 2015-12-01 LAB — URINE CULTURE

## 2015-12-02 ENCOUNTER — Telehealth (HOSPITAL_BASED_OUTPATIENT_CLINIC_OR_DEPARTMENT_OTHER): Payer: Self-pay | Admitting: Emergency Medicine

## 2015-12-02 NOTE — Telephone Encounter (Signed)
Post ED Visit - Positive Culture Follow-up: Successful Patient Follow-Up  Culture assessed and recommendations reviewed by: []  Elenor Quinones, Pharm.D. []  Heide Guile, Pharm.D., BCPS []  Parks Neptune, Pharm.D. []  Alycia Rossetti, Pharm.D., BCPS []  Teachey, Pharm.D., BCPS, AAHIVP []  Legrand Como, Pharm.D., BCPS, AAHIVP []  Milus Glazier, Pharm.D. []  Stephens November, Pharm.D. Andria Meuse RPh  Positive urine culture  []  Patient discharged without antimicrobial prescription and treatment is now indicated [x]  Organism is resistant to prescribed ED discharge antimicrobial []  Patient with positive blood cultures  Changes discussed with ED provider: Alyse Low PA New antibiotic prescription continue Ciprofloxacin and add Cephalexin 500mg  po bid x 7 days [facility] [phone]  12/02/15 1243   Hazle Nordmann 12/02/2015, 12:41 PM

## 2015-12-02 NOTE — Progress Notes (Signed)
ED Antimicrobial Stewardship Positive Culture Follow Up   Ruben [name] is an 65 y.o. male who presented to [facility] on 11/28/2015 with a chief complaint of  Chief Complaint  Patient presents with  . Fever    Recent Results (from the past 720 hour(s))  Urine culture     Status: Abnormal   Collection Time: 11/28/15  5:22 PM  Result Value Ref Range Status   Specimen Description URINE, RANDOM  Final   Special Requests NONE  Final   Culture (A)  Final    >=100,000 COLONIES/mL ESCHERICHIA COLI 70,000 COLONIES/mL PSEUDOMONAS AERUGINOSA    Report Status 12/01/2015 FINAL  Final   Organism ID, Bacteria ESCHERICHIA COLI (A)  Final   Organism ID, Bacteria PSEUDOMONAS AERUGINOSA (A)  Final      Susceptibility   Escherichia coli - MIC*    AMPICILLIN 4 SENSITIVE Sensitive     CEFAZOLIN <=4 SENSITIVE Sensitive     CEFTRIAXONE <=1 SENSITIVE Sensitive     CIPROFLOXACIN >=4 RESISTANT Resistant     GENTAMICIN <=1 SENSITIVE Sensitive     IMIPENEM <=0.25 SENSITIVE Sensitive     NITROFURANTOIN <=16 SENSITIVE Sensitive     TRIMETH/SULFA <=20 SENSITIVE Sensitive     AMPICILLIN/SULBACTAM 4 SENSITIVE Sensitive     PIP/TAZO <=4 SENSITIVE Sensitive     Extended ESBL NEGATIVE Sensitive     * >=100,000 COLONIES/mL ESCHERICHIA COLI   Pseudomonas aeruginosa - MIC*    CEFTAZIDIME 4 SENSITIVE Sensitive     CIPROFLOXACIN <=0.25 SENSITIVE Sensitive     GENTAMICIN 2 SENSITIVE Sensitive     IMIPENEM 2 SENSITIVE Sensitive     PIP/TAZO 8 SENSITIVE Sensitive     CEFEPIME 2 SENSITIVE Sensitive     * 70,000 COLONIES/mL PSEUDOMONAS AERUGINOSA  Blood Culture (routine x 2)     Status: None (Preliminary result)   Collection Time: 11/28/15  6:10 PM  Result Value Ref Range Status   Specimen Description BLOOD RIGHT ARM  Final   Special Requests BOTTLES DRAWN AEROBIC AND ANAEROBIC 10CC EA  Final   Culture   Final    NO GROWTH 3 DAYS Performed at [facility]    Report Status PENDING  Incomplete  Blood  Culture (routine x 2)     Status: None (Preliminary result)   Collection Time: 11/28/15  6:37 PM  Result Value Ref Range Status   Specimen Description   Final    BLOOD LEFT FOREARM BOTTLES DRAWN AEROBIC AND ANAEROBIC   Special Requests BOTTLES DRAWN AEROBIC AND ANAEROBIC 5CC EA  Final   Culture   Final    NO GROWTH 3 DAYS Performed at [facility] [place]    Report Status PENDING  Incomplete    [x]  Treated with ciprofloxacin, urine grew E. Coli and P. Aeruginosa. E. Coli was resistant to prescribed antimicrobial  New antibiotic prescription: Cephalexin 500 mg PO BID for 7 days. Continue ciprofloxacin  ED Provider: Alyse Low, PA-C  Myer Peer Grayland Ormond), PharmD  PGY1 Pharmacy Resident Pager: [phone] 12/02/2015 8:56 AM

## 2015-12-03 ENCOUNTER — Telehealth (HOSPITAL_BASED_OUTPATIENT_CLINIC_OR_DEPARTMENT_OTHER): Payer: Self-pay | Admitting: *Deleted

## 2015-12-03 LAB — CULTURE, BLOOD (ROUTINE X 2)
Culture: NO GROWTH
Culture: NO GROWTH

## 2015-12-03 NOTE — Telephone Encounter (Signed)
Pt called because he was notified yesterday of +urine culture and new [facility] [address] but was not there when he went to pick it up. [facility] and they found the Rx "in limbo" in their system. Stated it would be ready within the hour. Pt called and updated that the [facility] will be [facility].

## 2015-12-21 ENCOUNTER — Other Ambulatory Visit: Payer: Self-pay | Admitting: Urology

## 2015-12-23 ENCOUNTER — Encounter (HOSPITAL_BASED_OUTPATIENT_CLINIC_OR_DEPARTMENT_OTHER): Payer: Self-pay | Admitting: *Deleted

## 2015-12-23 NOTE — Progress Notes (Addendum)
NPO AFTER MN.  ARRIVE AT 0830. NEEDS ISTAT AND EKG.  CALLED [facility] SPOKE W/ PHARMACIST, STATED NORMAL DOSAGE FOR CEFEPIME IS 2GRAMS IV.

## 2015-12-27 NOTE — H&P (Signed)
Office Visit Report     12/17/2015   --------------------------------------------------------------------------------   Ruben [name]  MRN: [id number]  PRIMARY CARE:  Edwyna Shell. Jacelyn Grip, MD  DOB: July 02, 1950, 65 year old Male  REFERRING:    SSN:   PROVIDER:  Festus Aloe, M.D.    LOCATION:  [facility] [phone]   --------------------------------------------------------------------------------   CC: I have urinary retention.  HPI: Ruben [name] is a 65 year-old male established patient who is here for urinary retention.  His problem was diagnosed 11/02/2015. His current symptoms did not begin after he had a surgical procedure. His urinary retention is being treated with foley catheter, flomax, and proscar. Patient denies suprapubic tube, intemittent catheterization, hytrin, cardura, uroxatrol, rapaflo, and avodart.   He does have an abnormal sensation when needing to urinate.   Foley catheter, tamsulosin and finasteride started Jul 2017. Drained 2100 ml.   He failed a void trial and underwent UDS. UDS revealed capacity 770 ml, hyposensitive, but a good contraction and no flow. Prior cysto with trilobar hypertrophy.     CC: BPH  HPI: Patient is currently treated with Tried Rapaflo. Stopped due to side effects. for his symptoms. His symptoms have been worse over the last year.   AUASS 21when pt presented with retention and overflow incontinence. Prostate 76 g on prior imaging and 74 grams today.      CC: Elevated PSA-Established Patient  HPI: The patient states he does take 5 alpha reductase inhibitor medication.   He has had a prostate biopsy done. Patient does not have a family history of prostate cancer.   biopsy x 2. 2009 and 2011, prostate 76 grams. PSA 3.5 - 6.16 Mar 2015 PSA 4.5 with a Normal DRE.       AUA Symptom Score: 50% of the time he has the sensation of not emptying his bladder completely when finished urinating. More than 50% of the time he  has to urinate again fewer than two hours after he has finished urinating. More than 50% of the time he has to start and stop again several times when he urinates. 50% of the time he finds it difficult to postpone urination. Less than 20% of the time he has a weak urinary stream. Less than 20% of the time he has to push or strain to begin urination. He has to get up to urinate 5 or more times from the time he goes to bed until the time he gets up in the morning.   Calculated AUA Symptom Score: 21    ALLERGIES: No Allergies    MEDICATIONS: Proscar 5 mg tablet 1 tablet PO Daily  Tamsulosin Hcl 0.4 mg capsule, ext release 24 hr 1 capsule PO Q HS  Levitra 20 MG Oral Tablet 0 Oral  Omeprazole TBEC Oral  Sildenafil Citrate 20 MG Oral Tablet 0 Oral  Viagra 100 MG Oral Tablet 0 Oral     GU PSH: Complex cystometrogram, w/ void pressure and urethral pressure profile studies, any technique - 11/25/2015 Complex Uroflow - 11/25/2015 Cystoscopy - 11/02/2015 Emg surf Electrd - 11/25/2015 Inject For cystogram - 11/25/2015 Intrabd voidng Press - 11/25/2015 Prostate Needle Biopsy - 2010      PSH Notes: Biopsy Of The Prostate Needle   NON-GU PSH: None   GU PMH: Elevated PSA - 11/09/2015, Elevated PSA, Elevated prostate specific antigen (PSA) - 03/12/2015 Urinary Retention - 11/09/2015, - 11/02/2015 BPH w/LUTS - 11/02/2015, BPH w/LUTS, Benign prostatic hyperplasia with urinary obstruction - 03/12/2015 Urinary Urgency,  Urinary urgency - 03/12/2015 ED, arterial insufficiency, Erectile dysfunction due to arterial insufficiency - 12/30/2013 Carcinoma in situ of prostate, PIN III (prostatic intraepithelial neoplasia III) - 2014 Prostate, Neoplasm of uncertain behavior, Neoplasm of uncertain behavior of prostate - 2014      PMH Notes:  2011-01-16 09:56:51 - Note: Herniated Cervical Disc   NON-GU PMH: Encounter for general adult medical examination without abnormal findings, Encounter for preventive health examination  - 03/12/2015 Personal history of diseases of the skin and subcutaneous tissue, History of rosacea - 2014 Personal history of other diseases of the circulatory system, History of hypertension - 2014 Personal history of other diseases of the digestive system, History of esophageal reflux - 2014    FAMILY HISTORY: Breast Cancer - Mother Family Health Status - Father's Age - Father Family Health Status - Mother's Age - Mother Hypertension - Mother Stroke Syndrome - Father   SOCIAL HISTORY: Marital Status: Single Drinks 2 caffeinated drinks per day.     Notes: Former smoker, Former Smoker, Caffeine Use, Occupation:, Alcohol Use, Tobacco Use, Marital History - Single   REVIEW OF SYSTEMS:    GU Review Male:   Patient denies frequent urination, hard to postpone urination, burning/ pain with urination, get up at night to urinate, leakage of urine, stream starts and stops, trouble starting your stream, have to strain to urinate , erection problems, and penile pain.  Gastrointestinal (Upper):   Patient denies nausea, vomiting, and indigestion/ heartburn.  Gastrointestinal (Lower):   Patient denies diarrhea and constipation.  Constitutional:   Patient reports fever and night sweats. Patient denies weight loss and fatigue.  Skin:   Patient denies skin rash/ lesion and itching.  Eyes:   Patient denies blurred vision and double vision.  Ears/ Nose/ Throat:   Patient denies sore throat and sinus problems.  Hematologic/Lymphatic:   Patient denies swollen glands and easy bruising.  Cardiovascular:   Patient denies leg swelling and chest pains.  Respiratory:   Patient denies cough and shortness of breath.  Endocrine:   Patient denies excessive thirst.  Musculoskeletal:   Patient denies back pain and joint pain.  Neurological:   Patient denies headaches and dizziness.  Psychologic:   Patient denies depression and anxiety.   VITAL SIGNS: None   GU PHYSICAL EXAMINATION:    Anus and Perineum: No  hemorrhoids. No anal stenosis. No rectal fissure, no anal fissure. No edema, no dimple, no perineal tenderness, no anal tenderness.  Scrotum: No lesions. No edema. No cysts. No warts.  Epididymides: Right: no spermatocele, no masses, no cysts, no tenderness, no induration, no enlargement. Left: no spermatocele, no masses, no cysts, no tenderness, no induration, no enlargement.  Testes: No tenderness, no swelling, no enlargement left testes. No tenderness, no swelling, no enlargement right testes. Normal location left testes. Normal location right testes. No mass, no cyst, no varicocele, no hydrocele left testes. No mass, no cyst, no varicocele, no hydrocele right testes.  Urethral Meatus: Normal size. No lesion, no wart, no discharge, no polyp. Normal location.  Penis: Circumcised, no warts, no cracks. No dorsal Peyronie's plaques, no left corporal Peyronie's plaques, no right corporal Peyronie's plaques, no scarring, no warts. No balanitis, no meatal stenosis.  Prostate: Prostate about 60 grams. Right lobe firm. Left lobe normal consistency. Symmetrical lobes. No prostate nodule. Left lobe no tenderness, right lobe no tenderness. Exam done prior to prostate u/s (I had to place probe, pt quite tense).   Seminal Vesicles: Nonpalpable.  Sphincter Tone: Normal sphincter. No  rectal tenderness. No rectal mass.    MULTI-SYSTEM PHYSICAL EXAMINATION:    Constitutional: Well-nourished. No physical deformities. Normally developed. Good grooming.  Neck: Neck symmetrical, not swollen. Normal tracheal position.  Respiratory: No labored breathing, no use of accessory muscles.   Cardiovascular: Normal temperature, normal extremity pulses, no swelling, no varicosities.  Skin: No paleness, no jaundice, no cyanosis. No lesion, no ulcer, no rash.  Neurologic / Psychiatric: Oriented to time, oriented to place, oriented to person. No depression, no anxiety, no agitation.     PAST DATA REVIEWED:  Source Of History:   Patient   03/04/15 12/25/13 01/17/13 01/26/12 07/31/11 01/16/11 07/20/10 01/05/10  PSA  Total PSA 3.66  3.60  3.57  3.85  4.13  3.66  4.41  4.95   Free PSA       0.6  0.50   % Free PSA       14  10.1     PROCEDURES:         Prostate Ultrasound - [id number]  Length:5.13 Height:4.70 Width:5.89 Volume:74.33  Prostate:  Slightly inhomogenous.      The transrectal ultrasound probe is introduced into the rectum, and the prostate is visualized. Ultrasonography is utilized throughout the procedure. At the conclusion of the procedure, the ultrasound probe is removed. The patient tolerates the procedure without complication.         Urinalysis w/Scope - 81001 Dipstick Dipstick Cont'd Micro  Specimen: Voided Bilirubin: Neg WBC/hpf: 40-60/hpf  Color: Yellow Ketones: Neg RBC/hpf: 10-20/hpf  Appearance: Cloudy Blood: 3+ Bacteria: Many (>50/hpf)  Specific Gravity: 1.025 Protein: 3+ Cystals: NS (Not Seen)  pH: 6.0 Urobilinogen: 0.2 Casts: NS (Not Seen)  Glucose: Neg Nitrites: Neg Trichomonas: Not Present    Leukocyte Esterase: 3+ Mucous: Not Present      Epithelial Cells: 0-5/hpf      Yeast: Few (1-5/hpf)      Sperm: Not Present    ASSESSMENT:      ICD-10 Details  1 GU:   BPH w/LUTS - N40.1   2   Urinary Retention - R33.8   3   Elevated PSA - R97.20    PLAN:           Orders Labs PSA, Urine Culture and Sensitivity          Schedule Return Visit: Next Available Appointment - Office Visit, Schedule Surgery          Document Letter(s):  Created for Patient: Clinical Summary         Notes:   Elevated PSA-PSA was sent knowing that might be elevated given the recent urinary tract infection, antibiotic use, Foley catheter as well as the prostate ultrasound today. His DRE showed a firm right and left gland.   BPH - on Combination therapy. we discussed continuing this and trying for a voiding trial down the road versus proceeding with an outlet procedure. We discussed risks of prostate  procedure such as bleeding, infection, stricture, incontinence, erectile dysfunction among others. All questions answered. He elects to proceed with prostate procedure and we will attempt greenlight photo vaporization. PSA elevated we may also have a biopsy. he grew Escherichia coli and Pseudomonas on cultures after his urodynamics. I did repeat a culture today.    cc: Dr. Jacelyn Grip     * Signed by Festus Aloe, M.D. on 12/20/15 at 11:27 AM (EDT)*     The information contained in this medical record document is considered private and confidential patient information. This information can only be used  for the medical diagnosis and/or medical services that are being provided by the patient's selected caregivers. This information can only be distributed outside of the patient's care if the patient agrees and signs waivers of authorization for this information to be sent to an outside source or route.

## 2015-12-28 ENCOUNTER — Ambulatory Visit (HOSPITAL_BASED_OUTPATIENT_CLINIC_OR_DEPARTMENT_OTHER)
Admission: RE | Admit: 2015-12-28 | Discharge: 2015-12-28 | Disposition: A | Discharge status: Home / Self Care | Payer: Managed Care, Other (non HMO) | Source: Ambulatory Visit | Attending: Urology | Admitting: Urology

## 2015-12-28 ENCOUNTER — Ambulatory Visit (HOSPITAL_BASED_OUTPATIENT_CLINIC_OR_DEPARTMENT_OTHER): Payer: Managed Care, Other (non HMO) | Admitting: Anesthesiology

## 2015-12-28 ENCOUNTER — Encounter (HOSPITAL_BASED_OUTPATIENT_CLINIC_OR_DEPARTMENT_OTHER): Payer: Self-pay | Admitting: Certified Registered"

## 2015-12-28 ENCOUNTER — Encounter (HOSPITAL_BASED_OUTPATIENT_CLINIC_OR_DEPARTMENT_OTHER)
Admission: RE | Disposition: A | Discharge status: Home / Self Care | Payer: Self-pay | Source: Ambulatory Visit | Attending: Urology

## 2015-12-28 DIAGNOSIS — Z79899 Other long term (current) drug therapy: Secondary | ICD-10-CM | POA: Insufficient documentation

## 2015-12-28 DIAGNOSIS — R338 Other retention of urine: Secondary | ICD-10-CM | POA: Diagnosis not present

## 2015-12-28 DIAGNOSIS — Z8546 Personal history of malignant neoplasm of prostate: Secondary | ICD-10-CM | POA: Insufficient documentation

## 2015-12-28 DIAGNOSIS — N401 Enlarged prostate with lower urinary tract symptoms: Secondary | ICD-10-CM | POA: Diagnosis present

## 2015-12-28 DIAGNOSIS — I10 Essential (primary) hypertension: Secondary | ICD-10-CM | POA: Diagnosis not present

## 2015-12-28 DIAGNOSIS — Z87891 Personal history of nicotine dependence: Secondary | ICD-10-CM | POA: Insufficient documentation

## 2015-12-28 HISTORY — DX: Diaphragmatic hernia without obstruction or gangrene: K44.9

## 2015-12-28 HISTORY — DX: Personal history of adenomatous and serrated colon polyps: Z86.0101

## 2015-12-28 HISTORY — DX: Retention of urine, unspecified: R33.9

## 2015-12-28 HISTORY — DX: Benign prostatic hyperplasia without lower urinary tract symptoms: N40.0

## 2015-12-28 HISTORY — PX: GREEN LIGHT LASER TURP (TRANSURETHRAL RESECTION OF PROSTATE: SHX6260

## 2015-12-28 HISTORY — DX: Gastro-esophageal reflux disease without esophagitis: K21.9

## 2015-12-28 HISTORY — DX: Personal history of other infectious and parasitic diseases: Z86.19

## 2015-12-28 HISTORY — DX: Presence of urogenital implants: Z96.0

## 2015-12-28 HISTORY — DX: Presence of other specified devices: Z97.8

## 2015-12-28 HISTORY — DX: Esophageal obstruction: K22.2

## 2015-12-28 HISTORY — DX: Personal history of colonic polyps: Z86.010

## 2015-12-28 HISTORY — DX: Other constipation: K59.09

## 2015-12-28 LAB — POCT I-STAT 4, (NA,K, GLUC, HGB,HCT)
Glucose, Bld: 99 mg/dL (ref 65–99)
HCT: 45 % (ref 39.0–52.0)
HEMOGLOBIN: 15.3 g/dL (ref 13.0–17.0)
Potassium: 4.1 mmol/L (ref 3.5–5.1)
Sodium: 140 mmol/L (ref 135–145)

## 2015-12-28 SURGERY — GREEN LIGHT LASER TURP (TRANSURETHRAL RESECTION OF PROSTATE
Anesthesia: General | Site: Prostate

## 2015-12-28 MED ORDER — PHENAZOPYRIDINE HCL 200 MG PO TABS
200.0000 mg | ORAL_TABLET | Freq: Once | ORAL | Status: AC
  Administered 2015-12-28: 200 mg via ORAL
  Filled 2015-12-28: qty 1

## 2015-12-28 MED ORDER — FENTANYL CITRATE (PF) 100 MCG/2ML IJ SOLN
INTRAMUSCULAR | Status: AC
  Filled 2015-12-28: qty 2

## 2015-12-28 MED ORDER — MIDAZOLAM HCL 5 MG/5ML IJ SOLN
INTRAMUSCULAR | Status: DC | PRN
  Administered 2015-12-28: 2 mg via INTRAVENOUS

## 2015-12-28 MED ORDER — FENTANYL CITRATE (PF) 100 MCG/2ML IJ SOLN
INTRAMUSCULAR | Status: AC
Start: 2015-12-28 — End: 2015-12-28
  Filled 2015-12-28: qty 2

## 2015-12-28 MED ORDER — BELLADONNA ALKALOIDS-OPIUM 16.2-60 MG RE SUPP
RECTAL | Status: DC | PRN
  Administered 2015-12-28: 1 via RECTAL

## 2015-12-28 MED ORDER — FENTANYL CITRATE (PF) 100 MCG/2ML IJ SOLN
25.0000 ug | INTRAMUSCULAR | Status: DC | PRN
  Administered 2015-12-28 (×2): 50 ug via INTRAVENOUS
  Filled 2015-12-28: qty 1

## 2015-12-28 MED ORDER — FENTANYL CITRATE (PF) 100 MCG/2ML IJ SOLN
INTRAMUSCULAR | Status: DC | PRN
  Administered 2015-12-28 (×4): 25 ug via INTRAVENOUS
  Administered 2015-12-28: 50 ug via INTRAVENOUS
  Administered 2015-12-28 (×2): 25 ug via INTRAVENOUS

## 2015-12-28 MED ORDER — OXYCODONE HCL 5 MG/5ML PO SOLN
5.0000 mg | Freq: Once | ORAL | Status: AC | PRN
  Filled 2015-12-28: qty 5

## 2015-12-28 MED ORDER — ACETAMINOPHEN 160 MG/5ML PO SOLN
325.0000 mg | ORAL | Status: DC | PRN
  Filled 2015-12-28: qty 20.3

## 2015-12-28 MED ORDER — PROPOFOL 10 MG/ML IV BOLUS
INTRAVENOUS | Status: DC | PRN
  Administered 2015-12-28: 200 mg via INTRAVENOUS

## 2015-12-28 MED ORDER — DEXAMETHASONE SODIUM PHOSPHATE 4 MG/ML IJ SOLN
INTRAMUSCULAR | Status: DC | PRN
  Administered 2015-12-28: 10 mg via INTRAVENOUS

## 2015-12-28 MED ORDER — DEXTROSE 5 % IV SOLN
2.0000 g | Freq: Once | INTRAVENOUS | Status: AC
  Administered 2015-12-28: 2 g via INTRAVENOUS
  Filled 2015-12-28: qty 2

## 2015-12-28 MED ORDER — MIDAZOLAM HCL 2 MG/2ML IJ SOLN
INTRAMUSCULAR | Status: AC
  Filled 2015-12-28: qty 2

## 2015-12-28 MED ORDER — LACTATED RINGERS IV SOLN
INTRAVENOUS | Status: DC
  Administered 2015-12-28 (×2): via INTRAVENOUS
  Filled 2015-12-28 (×2): qty 1000

## 2015-12-28 MED ORDER — PHENAZOPYRIDINE HCL 100 MG PO TABS
ORAL_TABLET | ORAL | Status: AC
  Filled 2015-12-28: qty 2

## 2015-12-28 MED ORDER — OXYCODONE HCL 5 MG PO TABS
5.0000 mg | ORAL_TABLET | Freq: Once | ORAL | Status: AC | PRN
  Administered 2015-12-28: 5 mg via ORAL
  Filled 2015-12-28: qty 1

## 2015-12-28 MED ORDER — LIDOCAINE 2% (20 MG/ML) 5 ML SYRINGE
INTRAMUSCULAR | Status: DC | PRN
  Administered 2015-12-28: 60 mg via INTRAVENOUS

## 2015-12-28 MED ORDER — PROPOFOL 10 MG/ML IV BOLUS
INTRAVENOUS | Status: AC
  Filled 2015-12-28: qty 20

## 2015-12-28 MED ORDER — SODIUM CHLORIDE 0.9 % IR SOLN
Status: DC | PRN
  Administered 2015-12-28: 22000 mL

## 2015-12-28 MED ORDER — ONDANSETRON HCL 4 MG/2ML IJ SOLN
INTRAMUSCULAR | Status: DC | PRN
  Administered 2015-12-28: 4 mg via INTRAVENOUS

## 2015-12-28 MED ORDER — ACETAMINOPHEN 325 MG PO TABS
325.0000 mg | ORAL_TABLET | ORAL | Status: DC | PRN
  Filled 2015-12-28: qty 2

## 2015-12-28 MED ORDER — OXYCODONE HCL 5 MG PO TABS
ORAL_TABLET | ORAL | Status: AC
  Filled 2015-12-28: qty 1

## 2015-12-28 MED ORDER — LIDOCAINE 2% (20 MG/ML) 5 ML SYRINGE
INTRAMUSCULAR | Status: AC
  Filled 2015-12-28: qty 5

## 2015-12-28 MED ORDER — BELLADONNA ALKALOIDS-OPIUM 16.2-60 MG RE SUPP
RECTAL | Status: AC
  Filled 2015-12-28: qty 1

## 2015-12-28 MED ORDER — TRAMADOL HCL 50 MG PO TABS: 100.0000 mg | ORAL_TABLET | Freq: Four times a day (QID) | ORAL | 0 refills | Status: DC | PRN

## 2015-12-28 SURGICAL SUPPLY — 36 items
BAG DRAIN URO-CYSTO SKYTR STRL (DRAIN) ×2 IMPLANT
BAG DRN UROCATH (DRAIN) ×1
BAG URINE DRAINAGE (UROLOGICAL SUPPLIES) ×2 IMPLANT
CATH COUDE FOLEY 2W 5CC 18FR (CATHETERS) ×1 IMPLANT
CATH FOLEY 2WAY SLVR  5CC 18FR (CATHETERS)
CATH FOLEY 2WAY SLVR 5CC 18FR (CATHETERS) IMPLANT
CATH FOLEY 3WAY 30CC 22F (CATHETERS) IMPLANT
CLOTH BEACON ORANGE TIMEOUT ST (SAFETY) ×2 IMPLANT
ELECT BIVAP BIPO 22/24 DONUT (ELECTROSURGICAL) IMPLANT
ELECT LOOP MED HF 24F 12D (CUTTING LOOP) IMPLANT
ELECTRD BIVAP BIPO 22/24 DONUT (ELECTROSURGICAL) IMPLANT
FEE RENTAL LASER GREENLIGHT (Laser) ×1 IMPLANT
GLOVE BIO SURGEON STRL SZ7 (GLOVE) ×1 IMPLANT
GLOVE BIO SURGEON STRL SZ7.5 (GLOVE) ×2 IMPLANT
GLOVE BIOGEL PI IND STRL 7.0 (GLOVE) IMPLANT
GLOVE BIOGEL PI IND STRL 7.5 (GLOVE) IMPLANT
GLOVE BIOGEL PI INDICATOR 7.0 (GLOVE) ×1
GLOVE BIOGEL PI INDICATOR 7.5 (GLOVE) ×1
GOWN STRL REUS W/ TWL XL LVL3 (GOWN DISPOSABLE) ×1 IMPLANT
GOWN STRL REUS W/TWL LRG LVL3 (GOWN DISPOSABLE) ×1 IMPLANT
GOWN STRL REUS W/TWL XL LVL3 (GOWN DISPOSABLE) ×3 IMPLANT
HOLDER FOLEY CATH W/STRAP (MISCELLANEOUS) ×1 IMPLANT
IV NS 1000ML (IV SOLUTION) ×2
IV NS 1000ML BAXH (IV SOLUTION) ×1 IMPLANT
IV NS IRRIG 3000ML ARTHROMATIC (IV SOLUTION) ×8 IMPLANT
IV SET EXTENSION GRAVITY 40 LF (IV SETS) ×2 IMPLANT
KIT ROOM TURNOVER WOR (KITS) ×2 IMPLANT
LASER FIBER /GREENLIGHT LASER (Laser) ×2 IMPLANT
LASER GREENLIGHT RENTAL P/PROC (Laser) ×2 IMPLANT
LOOP CUT BIPOLAR 24F LRG (ELECTROSURGICAL) IMPLANT
MANIFOLD NEPTUNE II (INSTRUMENTS) IMPLANT
PACK CYSTO (CUSTOM PROCEDURE TRAY) ×2 IMPLANT
SYR 30ML LL (SYRINGE) IMPLANT
SYRINGE IRR TOOMEY STRL 70CC (SYRINGE) IMPLANT
TUBE CONNECTING 12X1/4 (SUCTIONS) IMPLANT
WATER STERILE IRR 500ML POUR (IV SOLUTION) ×1 IMPLANT

## 2015-12-28 NOTE — Anesthesia Procedure Notes (Signed)
Procedure Name: LMA Insertion Date/Time: 12/28/2015 10:17 AM Performed by: Denna Haggard D Pre-anesthesia Checklist: Patient identified, Emergency Drugs available, Suction available and Patient being monitored Patient Re-evaluated:Patient Re-evaluated prior to inductionOxygen Delivery Method: Circle system utilized Preoxygenation: Pre-oxygenation with 100% oxygen Intubation Type: IV induction Ventilation: Mask ventilation without difficulty LMA: LMA inserted LMA Size: 4.0 Number of attempts: 1 Airway Equipment and Method: Bite block Placement Confirmation: positive ETCO2 Tube secured with: Tape Dental Injury: Teeth and Oropharynx as per pre-operative assessment

## 2015-12-28 NOTE — OR Nursing (Signed)
PATIENT ARRIVED TO OR 1 WITH FOLEY IN PLACE, REMOVED AND DISCARDED 100 ML URINE.

## 2015-12-28 NOTE — Anesthesia Postprocedure Evaluation (Signed)
Anesthesia Post Note  Patient: Ruben [name]  Procedure(s) Performed: Procedure(s) (LRB): GREEN LIGHT LASER TURP (TRANSURETHRAL RESECTION OF PROSTATE (N/A)  Patient location during evaluation: PACU Anesthesia Type: General Level of consciousness: awake Pain management: pain level controlled Vital Signs Assessment: post-procedure vital signs reviewed and stable Respiratory status: spontaneous breathing Cardiovascular status: stable Postop Assessment: no signs of nausea or vomiting Anesthetic complications: no    Last Vitals:  Vitals:   12/28/15 1215 12/28/15 1230  BP: (!) 146/86 (!) 148/94  Pulse: 89 90  Resp: 14 18  Temp:      Last Pain:  Vitals:   12/28/15 1230  TempSrc:   PainSc: 5                  Laurence Crofford

## 2015-12-28 NOTE — Op Note (Signed)
Preoperative diagnosis: BPH, urinary retention Postoperative diagnosis: Same  Procedure: Cystoscopy, greenlight photo vaporization of the prostate  Surgeon: Junious Silk  Anesthesia: Gen.  Indication for procedure: 65 year old with BPH and urinary retention who elected to proceed with outlet procedure. Given his prostate size discussed he may need a staged procedure.   Findings: On exam under anesthesia the penis was circumcised and without mass or lesion. The testicles were descended bilaterally and palpably normal. On digital rectal exam the prostate was smooth with some subtle induration of the right lobe. There were no nodules. Landmarks were preserved.   On cystoscopy the urethra was normal, the prostate had long obstructing lateral lobes that protruded into the bladder. The trigone and ureteral orifices were in their normal orthotopic position with clear efflux. There were no stones or foreign bodies in the bladder. There was moderate trabeculation of the bladder. There was cystitis cystica in the lower part of the bladder consistent with a Foley catheter.   Description of procedure: After consent was obtained patient brought to the operating room. He was placed in lithotomy position after adequate anesthesia. I did an exam under anesthesia and placed a B&O suppository. He was prepped and draped in the usual sterile fashion. A timeout was performed to confirm the patient and procedure. The cystoscope was passed per urethra and the bladder inspected. I made an incision at 6:00 to gauge the proper depth down to the bladder neck and brought this down to the apices bilaterally. I made usual hockey-stick incisions in the apices. Power was slowly turned up to 120, 140, 160 and I started at the bladder neck and vaporize the lateral lobes from anterior to posterior. The lobes were quite compressive and I had to frequently check the veru and work my way back in until an adequate channel was created to get  better visualization and good water flow. As then able to work anterior to posterior, bladder neck down to the veru to vaporize the right in the left lobes. This created an excellent channel. Hemostasis was good. I was pleased with the vaporization after 36 minutes and 59 seconds lasing time, max power 160 W, energy [phone] J. The bladder was drained and the channel inspected and noted to be a good one. Bladder neck and veru were again inspected and I did not vaporize down past the veru and left some apical tissue. The trigone and ureteral orifices appeared normal. There was excellent hemostasis at low-pressure. The bladder was filled and the scope removed. An 32 French coud catheter was placed with 20 mL in the balloon and seated at the bladder neck. The irrigation was clear. He was awakened and taken to the recovery room in stable condition.  Complications: None  Blood loss 20 mL  Specimens: None  Drains: 18 French coud catheter  Disposition: Patient stable to PACU

## 2015-12-28 NOTE — Interval H&P Note (Signed)
History and Physical Interval Note:  12/28/2015 10:05 AM  Ruben [name]  has presented today for surgery, with the diagnosis of BPH Retention  The various methods of treatment have been discussed with the patient and family. After consideration of risks, benefits and other options for treatment, the patient has consented to  Procedure(s): GREEN LIGHT LASER TURP (TRANSURETHRAL RESECTION OF PROSTATE (N/A) as a surgical intervention .  The patient's history has been reviewed, patient examined, no change in status, stable for surgery.  He started abx and noted urine cloudiness and odor resolved. Discussed poss conversion to TURP, need for staged procedure, bleeding, and infection among others. Discussed PSA elevation - potential etiology. Need to follow. I have reviewed the patient's chart and labs.  Questions were answered to the patient's satisfaction.     Ruben [name]

## 2015-12-28 NOTE — Anesthesia Preprocedure Evaluation (Signed)
Anesthesia Evaluation  Patient identified by MRN, date of birth, ID band Patient awake    Reviewed: Allergy & Precautions, NPO status , Patient's Chart, lab work & pertinent test results  History of Anesthesia Complications Negative for: history of anesthetic complications  Airway Mallampati: II  TM Distance: >3 FB Neck ROM: Full    Dental  (+) Teeth Intact   Pulmonary neg pulmonary ROS,    breath sounds clear to auscultation       Cardiovascular hypertension, Pt. on medications (-) angina(-) Past MI and (-) CHF  Rhythm:Regular     Neuro/Psych  Neuromuscular disease negative psych ROS   GI/Hepatic Neg liver ROS, hiatal hernia, GERD  ,  Endo/Other  negative endocrine ROS  Renal/GU negative Renal ROS     Musculoskeletal   Abdominal   Peds  Hematology negative hematology ROS (+)   Anesthesia Other Findings   Reproductive/Obstetrics                             Anesthesia Physical Anesthesia Plan  ASA: II  Anesthesia Plan: General   Post-op Pain Management:    Induction: Intravenous  Airway Management Planned: LMA  Additional Equipment: None  Intra-op Plan:   Post-operative Plan: Extubation in OR  Informed Consent: I have reviewed the patients History and Physical, chart, labs and discussed the procedure including the risks, benefits and alternatives for the proposed anesthesia with the patient or authorized representative who has indicated his/her understanding and acceptance.   Dental advisory given  Plan Discussed with: CRNA and Surgeon  Anesthesia Plan Comments:         Anesthesia Quick Evaluation

## 2015-12-28 NOTE — Transfer of Care (Signed)
Immediate Anesthesia Transfer of Care Note  Patient: KYALL WESBY  Procedure(s) Performed: Procedure(s) (LRB): GREEN LIGHT LASER TURP (TRANSURETHRAL RESECTION OF PROSTATE (N/A)  Patient Location: PACU  Anesthesia Type: General  Level of Consciousness: awake, oriented, sedated and patient cooperative  Airway & Oxygen Therapy: Patient Spontanous Breathing and Patient connected to face mask oxygen  Post-op Assessment: Report given to PACU RN and Post -op Vital signs reviewed and stable  Post vital signs: Reviewed and stable Immediate Anesthesia Transfer of Care Note  Patient: [name]  Procedure(s) Performed: Procedure(s) (LRB): GREEN LIGHT LASER TURP (TRANSURETHRAL RESECTION OF PROSTATE (N/A)  Patient Location: PACU  Anesthesia Type: General  Level of Consciousness: awake, oriented, sedated and patient cooperative  Airway & Oxygen Therapy: Patient Spontanous Breathing and Patient connected to face mask oxygen  Post-op Assessment: Report given to PACU RN and Post -op Vital signs reviewed and stable  Post vital signs: Reviewed and stable  Complications: No apparent anesthesia complications Complications: No apparent anesthesia complications Last Vitals:  Vitals:   12/28/15 0821 12/28/15 1200  BP: (!) 146/86 (!) 159/100  Pulse: 90   Resp: 16 14  Temp: 36.9 C 36.7 C

## 2015-12-28 NOTE — Discharge Instructions (Signed)
Prostate Laser Surgery, Care After Refer to this sheet in the next few weeks. These instructions provide you with information on caring for yourself after your procedure. Your health care provider may also give you more specific instructions. Your treatment has been planned according to current medical practices, but problems sometimes occur. Call your health care provider if you have any problems or questions after your procedure. WHAT TO EXPECT AFTER THE PROCEDURE  You may notice blood in your urine that could last up to 3 weeks.  After your catheter is removed, you will have burning (especially at the tip of your penis) when you urinate, especially at the end of urination. For the first few weeks after your procedure, you will feel the need to urinate often. HOME CARE INSTRUCTIONS   Do not perform vigorous exercise, especially heavy lifting, for 1 week or as directed by your health care provider.  Avoid sexual activity for 4-6 weeks or as directed by your health care provider.  Do not ride in a car for extended periods for 1 month or as directed by your health care provider.  Do not strain to have a bowel movement. Drink a lot of fluids and and make sure you get enough fiber in your diet.  Drink enough fluids to keep your urine clear or pale yellow. SEEK IMMEDIATE MEDICAL CARE IF:   Your catheter has been removed and you are suddenly unable to urinate.  Your catheter has not been removed, and it develops a blockage.  You start to have blood clots in your urine.  The blood in your urine becomes persistent or gets thick.  Your temperature is greater than 100.29F (38.1C).  You develop chest pains.  You develop shortness of breath.  You develop leg swelling or pain. MAKE SURE YOU:  Understand these instructions.  Will watch your condition.  Will get help right away if you are not doing well or get worse.   This information is not intended to replace advice given to you by  your health care provider. Make sure you discuss any questions you have with your health care provider.   Document Released: 03/27/2005 Document Revised: 04/01/2013 Document Reviewed: 09/16/2012 Elsevier Interactive Patient Education 2016 [place], Adult A Foley catheter is a soft, flexible tube. This tube is placed into your bladder to drain pee (urine). If you go home with this catheter in place, follow the instructions below. TAKING CARE OF THE CATHETER 1. Wash your hands with soap and water. 2. Put soap and water on a clean washcloth.  Clean the skin where the tube goes into your body.  Clean away from the tube site.  Never wipe toward the tube.  Clean the area using a circular motion.  Remove all the soap. Pat the area dry with a clean towel. For males, reposition the skin that covers the end of the penis (foreskin). 3. Attach the tube to your leg with tape or a leg strap. Do not stretch the tube tight. If you are using tape, remove any stickiness left behind by past tape you used. 4. Keep the drainage bag below your hips. Keep it off the floor. 5. Check your tube during the day. Make sure it is working and draining. Make sure the tube does not curl, twist, or bend. 6. Do not pull on the tube or try to take it out. TAKING CARE OF THE DRAINAGE BAGS You will have a large overnight drainage bag and a small  leg bag. You may wear the overnight bag any time. Never wear the small bag at night. Follow the directions below. Emptying the Drainage Bag Empty your drainage bag when it is  - full or at least 2-3 times a day. 1. Wash your hands with soap and water. 2. Keep the drainage bag below your hips. 3. Hold the dirty bag over the toilet or clean container. 4. Open the pour spout at the bottom of the bag. Empty the pee into the toilet or container. Do not let the pour spout touch anything. 5. Clean the pour spout with a gauze pad or cotton ball that has rubbing  alcohol on it. 6. Close the pour spout. 7. Attach the bag to your leg with tape or a leg strap. 8. Wash your hands well. Changing the Drainage Bag Change your bag once a month or sooner if it starts to smell or look dirty.  1. Wash your hands with soap and water. 2. Pinch the rubber tube so that pee does not spill out. 3. Disconnect the catheter tube from the drainage tube at the connection valve. Do not let the tubes touch anything. 4. Clean the end of the catheter tube with an alcohol wipe. Clean the end of a the drainage tube with a different alcohol wipe. 5. Connect the catheter tube to the drainage tube of the clean drainage bag. 6. Attach the new bag to the leg with tape or a leg strap. Avoid attaching the new bag too tightly. 7. Wash your hands well. Cleaning the Drainage Bag 1. Wash your hands with soap and water. 2. Wash the bag in warm, soapy water. 3. Rinse the bag with warm water. 4. Fill the bag with a mixture of white vinegar and water (1 cup vinegar to 1 quart warm water [.2 liter vinegar to 1 liter warm water]). Close the bag and soak it for 30 minutes in the solution. 5. Rinse the bag with warm water. 6. Hang the bag to dry with the pour spout open and hanging downward. 7. Store the clean bag (once it is dry) in a clean plastic bag. 8. Wash your hands well. PREVENT INFECTION  Wash your hands before and after touching your tube.  Take showers every day. Wash the skin where the tube enters your body. Do not take baths. Replace wet leg straps with dry ones, if this applies.  Do not use powders, sprays, or lotions on the genital area. Only use creams, lotions, or ointments as told by your doctor.  For females, wipe from front to back after going to the bathroom.  Drink enough fluids to keep your pee clear or pale yellow unless you are told not to have too much fluid (fluid restriction).  Do not let the drainage bag or tubing touch or lie on the floor.  Wear cotton  underwear to keep the area dry. GET HELP IF:  Your pee is cloudy or smells unusually bad.  Your tube becomes clogged.  You are not draining pee into the bag or your bladder feels full.  Your tube starts to leak. GET HELP RIGHT AWAY IF:  You have pain, puffiness (swelling), redness, or yellowish-white fluid (pus) where the tube enters the body.  You have pain in the belly (abdomen), legs, lower back, or bladder.  You have a fever.  You see blood fill the tube, or your pee is pink or red.  You feel sick to your stomach (nauseous), throw up (vomit), or have  chills.  Your tube gets pulled out. MAKE SURE YOU:   Understand these instructions.  Will watch your condition.  Will get help right away if you are not doing well or get worse.   This information is not intended to replace advice given to you by your health care provider. Make sure you discuss any questions you have with your health care provider.   Document Released: 07/22/2012 Document Revised: 04/17/2014 Document Reviewed: 07/22/2012 Elsevier Interactive Patient Education 2016 [place] Instructions  Activity: Get plenty of rest for the remainder of the day. A responsible adult should stay with you for 24 hours following the procedure.  For the next 24 hours, DO NOT: -Drive a car -Paediatric nurse -Drink alcoholic beverages -Take any medication unless instructed by your physician -Make any legal decisions or sign important papers.  Meals: Start with liquid foods such as gelatin or soup. Progress to regular foods as tolerated. Avoid greasy, spicy, heavy foods. If nausea and/or vomiting occur, drink only clear liquids until the nausea and/or vomiting subsides. Call your physician if vomiting continues.  Special Instructions/Symptoms: Your throat may feel dry or sore from the anesthesia or the breathing tube placed in your throat during surgery. If this causes discomfort, gargle  with warm salt water. The discomfort should disappear within 24 hours.  If you had a scopolamine patch placed behind your ear for the management of post- operative nausea and/or vomiting:  1. The medication in the patch is effective for 72 hours, after which it should be removed.  Wrap patch in a tissue and discard in the trash. Wash hands thoroughly with soap and water. 2. You may remove the patch earlier than 72 hours if you experience unpleasant side effects which may include dry mouth, dizziness or visual disturbances. 3. Avoid touching the patch. Wash your hands with soap and water after contact with the patch.

## 2015-12-29 ENCOUNTER — Encounter (HOSPITAL_BASED_OUTPATIENT_CLINIC_OR_DEPARTMENT_OTHER): Payer: Self-pay | Admitting: Urology

## 2016-07-18 ENCOUNTER — Other Ambulatory Visit: Payer: Self-pay | Admitting: Urology

## 2016-07-18 DIAGNOSIS — R972 Elevated prostate specific antigen [PSA]: Secondary | ICD-10-CM

## 2016-08-02 ENCOUNTER — Ambulatory Visit (HOSPITAL_COMMUNITY)
Admission: RE | Admit: 2016-08-02 | Discharge: 2016-08-02 | Disposition: A | Discharge status: Home / Self Care | Payer: 59 | Source: Ambulatory Visit | Attending: Urology | Admitting: Urology

## 2016-08-02 DIAGNOSIS — R972 Elevated prostate specific antigen [PSA]: Secondary | ICD-10-CM | POA: Diagnosis present

## 2016-08-02 LAB — POCT I-STAT, CHEM 8
BUN: 33 mg/dL — ABNORMAL HIGH (ref 6–20)
CALCIUM ION: 1.1 mmol/L — AB (ref 1.15–1.40)
Chloride: 102 mmol/L (ref 101–111)
Creatinine, Ser: 1.2 mg/dL (ref 0.61–1.24)
GLUCOSE: 113 mg/dL — AB (ref 65–99)
HCT: 50 % (ref 39.0–52.0)
HEMOGLOBIN: 17 g/dL (ref 13.0–17.0)
Potassium: 4.8 mmol/L (ref 3.5–5.1)
Sodium: 137 mmol/L (ref 135–145)
TCO2: 29 mmol/L (ref 0–100)

## 2016-08-02 MED ORDER — LIDOCAINE HCL 2 % EX GEL: 1.0000 "application " | Freq: Once | CUTANEOUS | Status: DC

## 2016-08-02 MED ORDER — GADOBENATE DIMEGLUMINE 529 MG/ML IV SOLN
20.0000 mL | Freq: Once | INTRAVENOUS | Status: AC | PRN
  Administered 2016-08-02: 18 mL via INTRAVENOUS

## 2016-08-02 MED ORDER — LIDOCAINE HCL 2 % EX GEL
CUTANEOUS | Status: AC
  Filled 2016-08-02: qty 30

## 2016-11-07 DIAGNOSIS — F1020 Alcohol dependence, uncomplicated: Secondary | ICD-10-CM | POA: Diagnosis not present

## 2016-11-07 DIAGNOSIS — E7800 Pure hypercholesterolemia, unspecified: Secondary | ICD-10-CM | POA: Diagnosis not present

## 2016-11-07 DIAGNOSIS — F419 Anxiety disorder, unspecified: Secondary | ICD-10-CM | POA: Diagnosis not present

## 2016-11-07 DIAGNOSIS — I499 Cardiac arrhythmia, unspecified: Secondary | ICD-10-CM | POA: Diagnosis not present

## 2016-11-22 ENCOUNTER — Ambulatory Visit
Admission: RE | Admit: 2016-11-22 | Discharge: 2016-11-22 | Disposition: A | Discharge status: Home / Self Care | Payer: BLUE CROSS/BLUE SHIELD | Source: Ambulatory Visit | Attending: Cardiology | Admitting: Cardiology

## 2016-11-22 ENCOUNTER — Other Ambulatory Visit: Payer: Self-pay | Admitting: Cardiology

## 2016-11-22 DIAGNOSIS — R0602 Shortness of breath: Secondary | ICD-10-CM

## 2016-11-22 DIAGNOSIS — R002 Palpitations: Secondary | ICD-10-CM | POA: Diagnosis not present

## 2016-11-22 DIAGNOSIS — R0609 Other forms of dyspnea: Secondary | ICD-10-CM | POA: Diagnosis not present

## 2016-11-22 DIAGNOSIS — K449 Diaphragmatic hernia without obstruction or gangrene: Secondary | ICD-10-CM | POA: Diagnosis not present

## 2016-12-05 DIAGNOSIS — R002 Palpitations: Secondary | ICD-10-CM | POA: Diagnosis not present

## 2016-12-20 DIAGNOSIS — R0602 Shortness of breath: Secondary | ICD-10-CM | POA: Diagnosis not present

## 2016-12-20 DIAGNOSIS — R002 Palpitations: Secondary | ICD-10-CM | POA: Diagnosis not present

## 2016-12-25 ENCOUNTER — Other Ambulatory Visit (HOSPITAL_COMMUNITY): Payer: Self-pay | Admitting: Cardiology

## 2016-12-25 DIAGNOSIS — R0602 Shortness of breath: Secondary | ICD-10-CM | POA: Diagnosis not present

## 2016-12-25 DIAGNOSIS — R002 Palpitations: Secondary | ICD-10-CM | POA: Diagnosis not present

## 2016-12-25 DIAGNOSIS — R0609 Other forms of dyspnea: Secondary | ICD-10-CM | POA: Diagnosis not present

## 2016-12-26 ENCOUNTER — Ambulatory Visit
Admission: RE | Admit: 2016-12-26 | Discharge: 2016-12-26 | Disposition: A | Discharge status: Home / Self Care | Payer: BLUE CROSS/BLUE SHIELD | Source: Ambulatory Visit | Attending: Cardiology | Admitting: Cardiology

## 2016-12-26 DIAGNOSIS — I700 Atherosclerosis of aorta: Secondary | ICD-10-CM | POA: Diagnosis not present

## 2016-12-26 DIAGNOSIS — R911 Solitary pulmonary nodule: Secondary | ICD-10-CM

## 2016-12-26 DIAGNOSIS — R079 Chest pain, unspecified: Secondary | ICD-10-CM | POA: Diagnosis not present

## 2016-12-26 DIAGNOSIS — R0602 Shortness of breath: Secondary | ICD-10-CM

## 2016-12-26 HISTORY — DX: Atherosclerosis of aorta: I70.0

## 2016-12-26 HISTORY — DX: Solitary pulmonary nodule: R91.1

## 2017-01-03 DIAGNOSIS — R0609 Other forms of dyspnea: Secondary | ICD-10-CM | POA: Diagnosis not present

## 2017-01-03 DIAGNOSIS — R0602 Shortness of breath: Secondary | ICD-10-CM | POA: Diagnosis not present

## 2017-07-06 DIAGNOSIS — F5101 Primary insomnia: Secondary | ICD-10-CM | POA: Diagnosis not present

## 2017-07-06 DIAGNOSIS — H6123 Impacted cerumen, bilateral: Secondary | ICD-10-CM | POA: Diagnosis not present

## 2017-07-06 DIAGNOSIS — E782 Mixed hyperlipidemia: Secondary | ICD-10-CM | POA: Diagnosis not present

## 2017-07-06 DIAGNOSIS — I10 Essential (primary) hypertension: Secondary | ICD-10-CM | POA: Diagnosis not present

## 2017-07-06 DIAGNOSIS — Z0000 Encounter for general adult medical examination without abnormal findings: Secondary | ICD-10-CM | POA: Diagnosis not present

## 2017-08-17 DIAGNOSIS — D3131 Benign neoplasm of right choroid: Secondary | ICD-10-CM | POA: Diagnosis not present

## 2017-11-02 DIAGNOSIS — S70362A Insect bite (nonvenomous), left thigh, initial encounter: Secondary | ICD-10-CM | POA: Diagnosis not present

## 2017-12-17 DIAGNOSIS — R0600 Dyspnea, unspecified: Secondary | ICD-10-CM

## 2017-12-17 DIAGNOSIS — F1010 Alcohol abuse, uncomplicated: Secondary | ICD-10-CM | POA: Diagnosis not present

## 2017-12-17 DIAGNOSIS — R0609 Other forms of dyspnea: Secondary | ICD-10-CM | POA: Diagnosis not present

## 2017-12-17 DIAGNOSIS — K449 Diaphragmatic hernia without obstruction or gangrene: Secondary | ICD-10-CM | POA: Diagnosis not present

## 2017-12-17 DIAGNOSIS — R0602 Shortness of breath: Secondary | ICD-10-CM | POA: Diagnosis not present

## 2017-12-17 HISTORY — DX: Dyspnea, unspecified: R06.00

## 2017-12-24 DIAGNOSIS — R002 Palpitations: Secondary | ICD-10-CM | POA: Diagnosis not present

## 2017-12-24 DIAGNOSIS — R0602 Shortness of breath: Secondary | ICD-10-CM | POA: Diagnosis not present

## 2017-12-24 DIAGNOSIS — I10 Essential (primary) hypertension: Secondary | ICD-10-CM | POA: Diagnosis not present

## 2018-01-04 DIAGNOSIS — R739 Hyperglycemia, unspecified: Secondary | ICD-10-CM | POA: Diagnosis not present

## 2018-01-04 DIAGNOSIS — E782 Mixed hyperlipidemia: Secondary | ICD-10-CM | POA: Diagnosis not present

## 2018-01-04 DIAGNOSIS — I10 Essential (primary) hypertension: Secondary | ICD-10-CM | POA: Diagnosis not present

## 2018-01-04 DIAGNOSIS — F5101 Primary insomnia: Secondary | ICD-10-CM | POA: Diagnosis not present

## 2018-01-04 DIAGNOSIS — F1099 Alcohol use, unspecified with unspecified alcohol-induced disorder: Secondary | ICD-10-CM | POA: Diagnosis not present

## 2018-01-04 DIAGNOSIS — F10230 Alcohol dependence with withdrawal, uncomplicated: Secondary | ICD-10-CM | POA: Diagnosis not present

## 2018-01-09 ENCOUNTER — Other Ambulatory Visit: Payer: Self-pay | Admitting: Gastroenterology

## 2018-01-09 DIAGNOSIS — K449 Diaphragmatic hernia without obstruction or gangrene: Secondary | ICD-10-CM

## 2018-01-09 DIAGNOSIS — R0602 Shortness of breath: Secondary | ICD-10-CM | POA: Diagnosis not present

## 2018-01-09 DIAGNOSIS — R1310 Dysphagia, unspecified: Secondary | ICD-10-CM | POA: Diagnosis not present

## 2018-01-10 ENCOUNTER — Ambulatory Visit
Admission: RE | Admit: 2018-01-10 | Discharge: 2018-01-10 | Disposition: A | Discharge status: Home / Self Care | Payer: BLUE CROSS/BLUE SHIELD | Source: Ambulatory Visit | Attending: Gastroenterology | Admitting: Gastroenterology

## 2018-01-10 DIAGNOSIS — R1310 Dysphagia, unspecified: Secondary | ICD-10-CM

## 2018-01-10 DIAGNOSIS — K449 Diaphragmatic hernia without obstruction or gangrene: Secondary | ICD-10-CM

## 2018-01-10 DIAGNOSIS — K4040 Unilateral inguinal hernia, with gangrene, not specified as recurrent: Secondary | ICD-10-CM | POA: Diagnosis not present

## 2018-01-14 DIAGNOSIS — R0789 Other chest pain: Secondary | ICD-10-CM | POA: Diagnosis not present

## 2018-01-14 DIAGNOSIS — K449 Diaphragmatic hernia without obstruction or gangrene: Secondary | ICD-10-CM | POA: Diagnosis not present

## 2018-01-14 DIAGNOSIS — R933 Abnormal findings on diagnostic imaging of other parts of digestive tract: Secondary | ICD-10-CM | POA: Diagnosis not present

## 2018-01-14 DIAGNOSIS — K2970 Gastritis, unspecified, without bleeding: Secondary | ICD-10-CM | POA: Diagnosis not present

## 2018-01-24 DIAGNOSIS — Z23 Encounter for immunization: Secondary | ICD-10-CM | POA: Diagnosis not present

## 2018-01-28 DIAGNOSIS — K449 Diaphragmatic hernia without obstruction or gangrene: Secondary | ICD-10-CM | POA: Diagnosis not present

## 2018-01-28 DIAGNOSIS — Z789 Other specified health status: Secondary | ICD-10-CM | POA: Diagnosis not present

## 2018-02-07 ENCOUNTER — Ambulatory Visit: Payer: Self-pay | Admitting: General Surgery

## 2018-02-07 DIAGNOSIS — K449 Diaphragmatic hernia without obstruction or gangrene: Secondary | ICD-10-CM | POA: Diagnosis not present

## 2018-02-07 NOTE — H&P (Signed)
istory of Present Illness Ralene Ok MD; 02/07/2018 10:44 AM) The patient is a 67 year old male who presents with a hiatal hernia. Chief Complaint: Giant hiatal hernia  Patient is a 67 year old male who comes in with a 20+ year history of a hiatal hernia. Patient has had history of shortness of breath recently. Patient had a workup of cardiac clearance to rule out any cardiac origin of the shortness of breath. Patient does state that he has some dysphagia. Patient denies any reflux or GERD like symptoms. Patient denies any chronic cough issues. Patient did have a history of constipation.  Patient also states he drinks about 6 ounces of vodka per day. Patient is had no previous abdominal surgeries.  Patient had a previous upper GI which reveals a large hiatal hernia. Patient also in 2018 had a cardiac CT scan which reveals a large type IV hiatal hernia with a hiatal hernia approximately 5 cm. Patient had portion of his colon and stomach and his left chest.    Allergies Lindwood Coke, RN; 02/07/2018 10:09 AM) No Known Drug Allergies [02/07/2018]: Allergies Reconciled   Medication History (Diane Herrin, RN; 02/07/2018 10:08 AM) chlordiazePOXIDE HCl (25MG  Capsule, Oral) Active. Lisinopril-hydroCHLOROthiazide (20-12.5MG  Tablet, Oral) Active. Doxycycline Hyclate (100MG  Capsule, Oral) Active.    Review of Systems Ralene Ok, MD; 02/07/2018 10:47 AM) General Not Present- Appetite Loss, Chills, Fatigue, Fever, Night Sweats, Weight Gain and Weight Loss. Skin Not Present- Change in Wart/Mole, Dryness, Hives, Jaundice, New Lesions, Non-Healing Wounds, Rash and Ulcer. HEENT Present- Wears glasses/contact lenses. Not Present- Earache, Hearing Loss, Hoarseness, Nose Bleed, Oral Ulcers, Ringing in the Ears, Seasonal Allergies, Sinus Pain, Sore Throat, Visual Disturbances and Yellow Eyes. Breast Not Present- Breast Mass, Breast Pain, Nipple Discharge and Skin  Changes. Cardiovascular Not Present- Chest Pain, Difficulty Breathing Lying Down, Leg Cramps, Palpitations, Rapid Heart Rate, Shortness of Breath and Swelling of Extremities. Gastrointestinal Not Present- Abdominal Pain, Bloating, Bloody Stool, Change in Bowel Habits, Chronic diarrhea, Constipation, Difficulty Swallowing, Excessive gas, Gets full quickly at meals, Hemorrhoids, Indigestion, Nausea, Rectal Pain and Vomiting. Musculoskeletal Not Present- Back Pain, Joint Pain, Joint Stiffness, Muscle Pain, Muscle Weakness and Swelling of Extremities. Neurological Not Present- Decreased Memory, Fainting, Headaches, Numbness, Seizures, Tingling, Tremor, Trouble walking and Weakness. Psychiatric Not Present- Anxiety, Bipolar, Change in Sleep Pattern, Depression, Fearful and Frequent crying. Endocrine Not Present- Cold Intolerance, Excessive Hunger, Hair Changes, Heat Intolerance and New Diabetes. Hematology Not Present- Blood Thinners, Easy Bruising, Excessive bleeding, Gland problems, HIV and Persistent Infections. All other systems negative  Vitals (Diane Herrin RN; 02/07/2018 10:09 AM) 02/07/2018 10:09 AM Weight: 197.25 lb Height: 71in Body Surface Area: 2.1 m Body Mass Index: 27.51 kg/m  Temp.: 98.98F(Oral)  Pulse: 92 (Regular)  P.OX: 98% (Room air) BP: 134/82 (Sitting, Left Arm, Standard)       Physical Exam Ralene Ok MD; 02/07/2018 10:45 AM) The physical exam findings are as follows: Note:Constitutional: No acute distress, conversant, appears stated age  Eyes: Anicteric sclerae, moist conjunctiva, no lid lag  Neck: No thyromegaly, trachea midline, no cervical lymphadenopathy  Lungs: Clear to auscultation biilaterally, normal respiratory effot  Cardiovascular: regular rate & rhythm, no murmurs, no peripheal edema, pedal pulses 2+  GI: Soft, no masses or hepatosplenomegaly, non-tender to palpation  MSK: Normal gait, no clubbing cyanosis, edema  Skin: No  rashes, palpation reveals normal skin turgor  Psychiatric: Appropriate judgment and insight, oriented to person, place, and time    Assessment & Plan Ralene Ok MD; 02/07/2018 10:47 AM)  HIATAL HERNIA (K44.9) Impression: 67 year old male with a history of giant hiatal hernia, and alcohol dependence. Patient has a long history of dysphagia, shortness of breath.  1. Will proceed to the operating room for a robotic hiatal hernia repair with mesh and fundoplication  2. Discussed with patient the risks and benefits of the procedure to include but not limited to: Infection, bleeding, damage to structures, possible pneumothorax, possible recurrence. The patient voiced understanding and wishes to proceed.  3. Did discuss the patient that minimizing and stopping his alcohol intake between the surgery would be ideal to help prevent any issues with withdrawal symptoms in the hospital.

## 2018-03-04 DIAGNOSIS — I10 Essential (primary) hypertension: Secondary | ICD-10-CM | POA: Diagnosis not present

## 2018-03-04 DIAGNOSIS — R7989 Other specified abnormal findings of blood chemistry: Secondary | ICD-10-CM | POA: Diagnosis not present

## 2018-03-04 DIAGNOSIS — E782 Mixed hyperlipidemia: Secondary | ICD-10-CM | POA: Diagnosis not present

## 2018-04-10 HISTORY — PX: HERNIA REPAIR: SHX51

## 2018-04-22 ENCOUNTER — Encounter (HOSPITAL_COMMUNITY): Payer: Self-pay

## 2018-04-22 NOTE — Pre-Procedure Instructions (Signed)
The following are in hard chart: Last office visit note 12/17/2017 Dr. Virgina Jock ECHO 12/20/2016

## 2018-04-22 NOTE — Patient Instructions (Signed)
Your procedure is scheduled on: Friday, Jan. 17, 2020   Surgery Time:  7:30AM-11:30AM   Report to [facility]  Entrance    Report to admitting at 5:30 AM   Call this number if you have problems the morning of surgery [phone]   Do not eat food or drink liquids :After Midnight.   Brush your teeth the morning of surgery.   Do NOT smoke after Midnight   Take these medicines the morning of surgery with A SIP OF WATER: None                               You may not have any metal on your body including jewelry, and body piercings             Do not wear lotions, powders, perfumes/cologne, or deodorant                           Men may shave face and neck.   Do not bring valuables to the hospital. [place].   Contacts, dentures or bridgework may not be worn into surgery.   Leave suitcase in the car. After surgery it may be brought to your room.   Special Instructions: Bring a copy of your healthcare power of attorney and living will documents         the day of surgery if you haven't scanned them in before.              Please read over the following fact sheets you were given:  [facility] - Preparing for Surgery Before surgery, you can play an important role.  Because skin is not sterile, your skin needs to be as free of germs as possible.  You can reduce the number of germs on your skin by washing with CHG (chlorahexidine gluconate) soap before surgery.  CHG is an antiseptic cleaner which kills germs and bonds with the skin to continue killing germs even after washing. Please DO NOT use if you have an allergy to CHG or antibacterial soaps.  If your skin becomes reddened/irritated stop using the CHG and inform your nurse when you arrive at Short Stay. Do not shave (including legs and underarms) for at least 48 hours prior to the first CHG shower.  You may shave your face/neck.  Please follow these  instructions carefully:  1.  Shower with CHG Soap the night before surgery and the  morning of surgery.  2.  If you choose to wash your hair, wash your hair first as usual with your normal  shampoo.  3.  After you shampoo, rinse your hair and body thoroughly to remove the shampoo.                             4.  Use CHG as you would any other liquid soap.  You can apply chg directly to the skin and wash.  Gently with a scrungie or clean washcloth.  5.  Apply the CHG Soap to your body ONLY FROM THE NECK DOWN.   Do   not use on face/ open  Wound or open sores. Avoid contact with eyes, ears mouth and   genitals (private parts).                       Wash face,  Genitals (private parts) with your normal soap.             6.  Wash thoroughly, paying special attention to the area where your    surgery  will be performed.  7.  Thoroughly rinse your body with warm water from the neck down.  8.  DO NOT shower/wash with your normal soap after using and rinsing off the CHG Soap.                9.  Pat yourself dry with a clean towel.            10.  Wear clean pajamas.            11.  Place clean sheets on your bed the night of your first shower and do not  sleep with pets. Day of Surgery : Do not apply any lotions/deodorants the morning of surgery.  Please wear clean clothes to the hospital/surgery center.  FAILURE TO FOLLOW THESE INSTRUCTIONS MAY RESULT IN THE CANCELLATION OF YOUR SURGERY  PATIENT SIGNATURE_________________________________  NURSE SIGNATURE__________________________________  ________________________________________________________________________

## 2018-04-23 ENCOUNTER — Other Ambulatory Visit: Payer: Self-pay

## 2018-04-23 ENCOUNTER — Encounter (HOSPITAL_COMMUNITY)
Admission: RE | Admit: 2018-04-23 | Discharge: 2018-04-23 | Disposition: A | Discharge status: Home / Self Care | Payer: Medicare Other | Source: Ambulatory Visit | Attending: General Surgery | Admitting: General Surgery

## 2018-04-23 ENCOUNTER — Encounter (HOSPITAL_COMMUNITY): Payer: Self-pay

## 2018-04-23 DIAGNOSIS — K449 Diaphragmatic hernia without obstruction or gangrene: Secondary | ICD-10-CM | POA: Diagnosis not present

## 2018-04-23 DIAGNOSIS — Z01818 Encounter for other preprocedural examination: Secondary | ICD-10-CM | POA: Diagnosis not present

## 2018-04-23 HISTORY — DX: Pure hypercholesterolemia, unspecified: E78.00

## 2018-04-23 HISTORY — DX: Duodenal ulcer, unspecified as acute or chronic, without hemorrhage or perforation: K26.9

## 2018-04-23 HISTORY — DX: Atherosclerosis of aorta: I70.0

## 2018-04-23 HISTORY — DX: Male erectile dysfunction, unspecified: N52.9

## 2018-04-23 HISTORY — DX: Dyspnea, unspecified: R06.00

## 2018-04-23 HISTORY — DX: Nonrheumatic mitral (valve) insufficiency: I34.0

## 2018-04-23 HISTORY — DX: Scrotal varices: I86.1

## 2018-04-23 HISTORY — DX: Prediabetes: R73.03

## 2018-04-23 HISTORY — DX: Rosacea, unspecified: L71.9

## 2018-04-23 HISTORY — DX: Solitary pulmonary nodule: R91.1

## 2018-04-23 HISTORY — DX: Palpitations: R00.2

## 2018-04-23 HISTORY — DX: Alcohol dependence, uncomplicated: F10.20

## 2018-04-23 HISTORY — DX: Unspecified osteoarthritis, unspecified site: M19.90

## 2018-04-23 LAB — BASIC METABOLIC PANEL
Anion gap: 11 (ref 5–15)
BUN: 18 mg/dL (ref 8–23)
CO2: 26 mmol/L (ref 22–32)
Calcium: 8.7 mg/dL — ABNORMAL LOW (ref 8.9–10.3)
Chloride: 102 mmol/L (ref 98–111)
Creatinine, Ser: 1.15 mg/dL (ref 0.61–1.24)
GFR calc Af Amer: >60 mL/min (ref >60)
GFR calc non Af Amer: >60 mL/min (ref >60)
Glucose, Bld: 106 mg/dL — ABNORMAL HIGH (ref 70–99)
Potassium: 4 mmol/L (ref 3.5–5.1)
Sodium: 139 mmol/L (ref 135–145)

## 2018-04-23 LAB — HEMOGLOBIN A1C
Hgb A1c MFr Bld: 6.1 % — ABNORMAL HIGH (ref 4.8–5.6)
Mean Plasma Glucose: 128.37 mg/dL

## 2018-04-23 LAB — CBC
HCT: 50.6 % (ref 39.0–52.0)
Hemoglobin: 17 g/dL (ref 13.0–17.0)
MCH: 31.1 pg (ref 26.0–34.0)
MCHC: 33.6 g/dL (ref 30.0–36.0)
MCV: 92.7 fL (ref 80.0–100.0)
Platelets: 182 10*3/uL (ref 150–400)
RBC: 5.46 MIL/uL (ref 4.22–5.81)
RDW: 12.7 % (ref 11.5–15.5)
WBC: 7.7 10*3/uL (ref 4.0–10.5)
nRBC: 0 % (ref 0.0–0.2)

## 2018-04-23 NOTE — Pre-Procedure Instructions (Signed)
EKG 12/2017 in hard chart

## 2018-04-23 NOTE — Pre-Procedure Instructions (Signed)
BMP and Hgb A1c results 04/23/2018 sent to Dr. Rosendo Gros via epic.

## 2018-04-26 ENCOUNTER — Ambulatory Visit (HOSPITAL_COMMUNITY): Payer: Medicare Other | Admitting: Certified Registered"

## 2018-04-26 ENCOUNTER — Ambulatory Visit (HOSPITAL_COMMUNITY): Payer: Medicare Other | Admitting: Physician Assistant

## 2018-04-26 ENCOUNTER — Encounter (HOSPITAL_COMMUNITY): Payer: Self-pay | Admitting: *Deleted

## 2018-04-26 ENCOUNTER — Observation Stay (HOSPITAL_COMMUNITY)
Admission: RE | Admit: 2018-04-26 | Discharge: 2018-04-28 | Disposition: A | Discharge status: Home / Self Care | Payer: Medicare Other | Source: Ambulatory Visit | Attending: General Surgery | Admitting: General Surgery

## 2018-04-26 ENCOUNTER — Other Ambulatory Visit: Payer: Self-pay

## 2018-04-26 ENCOUNTER — Encounter (HOSPITAL_COMMUNITY)
Admission: RE | Disposition: A | Discharge status: Home / Self Care | Payer: Self-pay | Source: Ambulatory Visit | Attending: General Surgery

## 2018-04-26 DIAGNOSIS — Z23 Encounter for immunization: Secondary | ICD-10-CM | POA: Diagnosis not present

## 2018-04-26 DIAGNOSIS — K449 Diaphragmatic hernia without obstruction or gangrene: Principal | ICD-10-CM | POA: Insufficient documentation

## 2018-04-26 DIAGNOSIS — I10 Essential (primary) hypertension: Secondary | ICD-10-CM | POA: Insufficient documentation

## 2018-04-26 DIAGNOSIS — Z9889 Other specified postprocedural states: Secondary | ICD-10-CM | POA: Diagnosis present

## 2018-04-26 SURGERY — FUNDOPLICATION, NISSEN, ROBOT-ASSISTED, LAPAROSCOPIC
Anesthesia: General

## 2018-04-26 MED ORDER — CEFAZOLIN SODIUM-DEXTROSE 2-4 GM/100ML-% IV SOLN
2.0000 g | INTRAVENOUS | Status: AC
  Administered 2018-04-26: 2 g via INTRAVENOUS
  Filled 2018-04-26: qty 100

## 2018-04-26 MED ORDER — FENTANYL CITRATE (PF) 100 MCG/2ML IJ SOLN
INTRAMUSCULAR | Status: AC
  Filled 2018-04-26: qty 2

## 2018-04-26 MED ORDER — CELECOXIB 200 MG PO CAPS
200.0000 mg | ORAL_CAPSULE | ORAL | Status: AC
  Administered 2018-04-26: 200 mg via ORAL
  Filled 2018-04-26: qty 1

## 2018-04-26 MED ORDER — FENTANYL CITRATE (PF) 100 MCG/2ML IJ SOLN
INTRAMUSCULAR | Status: AC
  Administered 2018-04-26: 25 ug via INTRAVENOUS
  Filled 2018-04-26: qty 4

## 2018-04-26 MED ORDER — LIDOCAINE 2% (20 MG/ML) 5 ML SYRINGE
INTRAMUSCULAR | Status: AC
  Filled 2018-04-26: qty 5

## 2018-04-26 MED ORDER — FENTANYL CITRATE (PF) 100 MCG/2ML IJ SOLN
25.0000 ug | INTRAMUSCULAR | Status: DC | PRN
  Administered 2018-04-26: 25 ug via INTRAVENOUS

## 2018-04-26 MED ORDER — 0.9 % SODIUM CHLORIDE (POUR BTL) OPTIME
TOPICAL | Status: DC | PRN
  Administered 2018-04-26: 1000 mL

## 2018-04-26 MED ORDER — ONDANSETRON HCL 4 MG/2ML IJ SOLN: 4.0000 mg | Freq: Four times a day (QID) | INTRAMUSCULAR | Status: DC | PRN

## 2018-04-26 MED ORDER — LACTATED RINGERS IV SOLN
INTRAVENOUS | Status: DC | PRN
  Administered 2018-04-26: 08:00:00 via INTRAVENOUS

## 2018-04-26 MED ORDER — MIDAZOLAM HCL 2 MG/2ML IJ SOLN
INTRAMUSCULAR | Status: DC | PRN
  Administered 2018-04-26: 2 mg via INTRAVENOUS

## 2018-04-26 MED ORDER — DEXAMETHASONE SODIUM PHOSPHATE 10 MG/ML IJ SOLN
INTRAMUSCULAR | Status: AC
  Filled 2018-04-26: qty 1

## 2018-04-26 MED ORDER — ONDANSETRON HCL 4 MG/2ML IJ SOLN
INTRAMUSCULAR | Status: DC | PRN
  Administered 2018-04-26: 4 mg via INTRAVENOUS

## 2018-04-26 MED ORDER — BUPIVACAINE-EPINEPHRINE (PF) 0.25% -1:200000 IJ SOLN
INTRAMUSCULAR | Status: AC
  Filled 2018-04-26: qty 30

## 2018-04-26 MED ORDER — ROCURONIUM BROMIDE 100 MG/10ML IV SOLN
INTRAVENOUS | Status: AC
  Filled 2018-04-26: qty 1

## 2018-04-26 MED ORDER — HYDROMORPHONE HCL 1 MG/ML IJ SOLN
1.0000 mg | INTRAMUSCULAR | Status: DC | PRN
  Administered 2018-04-26 – 2018-04-28 (×8): 1 mg via INTRAVENOUS
  Filled 2018-04-26 (×8): qty 1

## 2018-04-26 MED ORDER — METOPROLOL TARTRATE 5 MG/5ML IV SOLN
INTRAVENOUS | Status: DC | PRN
  Administered 2018-04-26: 2.5 mg via INTRAVENOUS

## 2018-04-26 MED ORDER — MIDAZOLAM HCL 2 MG/2ML IJ SOLN: 1.0000 mg | INTRAMUSCULAR | Status: DC | PRN

## 2018-04-26 MED ORDER — LACTATED RINGERS IV SOLN
INTRAVENOUS | Status: DC
  Administered 2018-04-26 (×2): via INTRAVENOUS

## 2018-04-26 MED ORDER — FENTANYL CITRATE (PF) 250 MCG/5ML IJ SOLN
INTRAMUSCULAR | Status: AC
  Filled 2018-04-26: qty 5

## 2018-04-26 MED ORDER — ENOXAPARIN SODIUM 40 MG/0.4ML ~~LOC~~ SOLN
40.0000 mg | SUBCUTANEOUS | Status: DC
  Administered 2018-04-27 – 2018-04-28 (×2): 40 mg via SUBCUTANEOUS
  Filled 2018-04-26 (×2): qty 0.4

## 2018-04-26 MED ORDER — DEXTROSE-NACL 5-0.9 % IV SOLN
INTRAVENOUS | Status: DC
  Administered 2018-04-26 – 2018-04-27 (×3): via INTRAVENOUS
  Administered 2018-04-28: 50 mL/h via INTRAVENOUS

## 2018-04-26 MED ORDER — CHLORHEXIDINE GLUCONATE CLOTH 2 % EX PADS: 6.0000 | MEDICATED_PAD | Freq: Once | CUTANEOUS | Status: DC

## 2018-04-26 MED ORDER — DEXMEDETOMIDINE HCL 200 MCG/2ML IV SOLN
INTRAVENOUS | Status: DC | PRN
  Administered 2018-04-26: 8 ug via INTRAVENOUS

## 2018-04-26 MED ORDER — FENTANYL CITRATE (PF) 250 MCG/5ML IJ SOLN
INTRAMUSCULAR | Status: DC | PRN
  Administered 2018-04-26 (×9): 50 ug via INTRAVENOUS

## 2018-04-26 MED ORDER — LACTATED RINGERS IR SOLN
Status: DC | PRN
  Administered 2018-04-26: 1000 mL

## 2018-04-26 MED ORDER — KETAMINE HCL 10 MG/ML IJ SOLN
INTRAMUSCULAR | Status: DC | PRN
  Administered 2018-04-26: 20 mg via INTRAVENOUS
  Administered 2018-04-26: 25 mg via INTRAVENOUS

## 2018-04-26 MED ORDER — ROCURONIUM BROMIDE 10 MG/ML (PF) SYRINGE
PREFILLED_SYRINGE | INTRAVENOUS | Status: DC | PRN
  Administered 2018-04-26: 100 mg via INTRAVENOUS
  Administered 2018-04-26: 10 mg via INTRAVENOUS
  Administered 2018-04-26 (×2): 20 mg via INTRAVENOUS

## 2018-04-26 MED ORDER — LIDOCAINE 2% (20 MG/ML) 5 ML SYRINGE
INTRAMUSCULAR | Status: DC | PRN
  Administered 2018-04-26: 20 mg via INTRAVENOUS
  Administered 2018-04-26: 80 mg via INTRAVENOUS

## 2018-04-26 MED ORDER — ONDANSETRON HCL 4 MG/2ML IJ SOLN
INTRAMUSCULAR | Status: AC
  Filled 2018-04-26: qty 2

## 2018-04-26 MED ORDER — KETAMINE HCL 10 MG/ML IJ SOLN
INTRAMUSCULAR | Status: AC
  Filled 2018-04-26: qty 1

## 2018-04-26 MED ORDER — METOPROLOL TARTRATE 5 MG/5ML IV SOLN
INTRAVENOUS | Status: AC
  Filled 2018-04-26: qty 5

## 2018-04-26 MED ORDER — HYDRALAZINE HCL 20 MG/ML IJ SOLN: 10.0000 mg | INTRAMUSCULAR | Status: DC | PRN

## 2018-04-26 MED ORDER — PROPOFOL 10 MG/ML IV BOLUS
INTRAVENOUS | Status: DC | PRN
  Administered 2018-04-26: 200 mg via INTRAVENOUS

## 2018-04-26 MED ORDER — LIDOCAINE 2% (20 MG/ML) 5 ML SYRINGE
INTRAMUSCULAR | Status: DC | PRN
  Administered 2018-04-26: 1.5 mg/kg/h via INTRAVENOUS

## 2018-04-26 MED ORDER — SODIUM CHLORIDE 0.9 % IV SOLN
INTRAVENOUS | Status: DC | PRN
  Administered 2018-04-26: 25 ug/min via INTRAVENOUS

## 2018-04-26 MED ORDER — PHENYLEPHRINE HCL 10 MG/ML IJ SOLN
INTRAMUSCULAR | Status: AC
  Filled 2018-04-26: qty 2

## 2018-04-26 MED ORDER — LACTATED RINGERS IV SOLN: INTRAVENOUS | Status: DC

## 2018-04-26 MED ORDER — PHENYLEPHRINE 40 MCG/ML (10ML) SYRINGE FOR IV PUSH (FOR BLOOD PRESSURE SUPPORT)
PREFILLED_SYRINGE | INTRAVENOUS | Status: DC | PRN
  Administered 2018-04-26: 80 ug via INTRAVENOUS
  Administered 2018-04-26: 120 ug via INTRAVENOUS
  Administered 2018-04-26: 80 ug via INTRAVENOUS
  Administered 2018-04-26 (×2): 120 ug via INTRAVENOUS

## 2018-04-26 MED ORDER — MIDAZOLAM HCL 2 MG/2ML IJ SOLN
INTRAMUSCULAR | Status: AC
  Filled 2018-04-26: qty 2

## 2018-04-26 MED ORDER — MEPERIDINE HCL 50 MG/ML IJ SOLN: 6.2500 mg | INTRAMUSCULAR | Status: DC | PRN

## 2018-04-26 MED ORDER — SUGAMMADEX SODIUM 200 MG/2ML IV SOLN
INTRAVENOUS | Status: DC | PRN
  Administered 2018-04-26: 200 mg via INTRAVENOUS

## 2018-04-26 MED ORDER — ONDANSETRON 4 MG PO TBDP: 4.0000 mg | ORAL_TABLET | Freq: Four times a day (QID) | ORAL | Status: DC | PRN

## 2018-04-26 MED ORDER — EPHEDRINE SULFATE-NACL 50-0.9 MG/10ML-% IV SOSY
PREFILLED_SYRINGE | INTRAVENOUS | Status: DC | PRN
  Administered 2018-04-26: 5 mg via INTRAVENOUS

## 2018-04-26 MED ORDER — SUGAMMADEX SODIUM 500 MG/5ML IV SOLN
INTRAVENOUS | Status: AC
  Filled 2018-04-26: qty 5

## 2018-04-26 MED ORDER — DEXAMETHASONE SODIUM PHOSPHATE 10 MG/ML IJ SOLN
INTRAMUSCULAR | Status: DC | PRN
  Administered 2018-04-26: 8 mg via INTRAVENOUS

## 2018-04-26 MED ORDER — DEXMEDETOMIDINE HCL IN NACL 200 MCG/50ML IV SOLN
INTRAVENOUS | Status: AC
  Filled 2018-04-26: qty 50

## 2018-04-26 MED ORDER — EPHEDRINE 5 MG/ML INJ
INTRAVENOUS | Status: AC
  Filled 2018-04-26: qty 10

## 2018-04-26 MED ORDER — BUPIVACAINE-EPINEPHRINE 0.25% -1:200000 IJ SOLN
INTRAMUSCULAR | Status: DC | PRN
  Administered 2018-04-26: 14 mL

## 2018-04-26 MED ORDER — ESMOLOL HCL 100 MG/10ML IV SOLN
INTRAVENOUS | Status: AC
  Filled 2018-04-26: qty 10

## 2018-04-26 MED ORDER — PHENYLEPHRINE 40 MCG/ML (10ML) SYRINGE FOR IV PUSH (FOR BLOOD PRESSURE SUPPORT)
PREFILLED_SYRINGE | INTRAVENOUS | Status: AC
  Filled 2018-04-26: qty 20

## 2018-04-26 MED ORDER — LIDOCAINE 2% (20 MG/ML) 5 ML SYRINGE
INTRAMUSCULAR | Status: AC
  Filled 2018-04-26: qty 10

## 2018-04-26 MED ORDER — ACETAMINOPHEN 500 MG PO TABS
1000.0000 mg | ORAL_TABLET | ORAL | Status: AC
  Administered 2018-04-26: 1000 mg via ORAL
  Filled 2018-04-26: qty 2

## 2018-04-26 MED ORDER — GABAPENTIN 300 MG PO CAPS
300.0000 mg | ORAL_CAPSULE | ORAL | Status: AC
  Administered 2018-04-26: 300 mg via ORAL
  Filled 2018-04-26: qty 1

## 2018-04-26 MED ORDER — ESMOLOL HCL 100 MG/10ML IV SOLN
INTRAVENOUS | Status: DC | PRN
  Administered 2018-04-26 (×2): 20 mg via INTRAVENOUS

## 2018-04-26 MED ORDER — METOCLOPRAMIDE HCL 5 MG/ML IJ SOLN: 10.0000 mg | Freq: Once | INTRAMUSCULAR | Status: DC | PRN

## 2018-04-26 MED ORDER — PROPOFOL 10 MG/ML IV BOLUS
INTRAVENOUS | Status: AC
  Filled 2018-04-26: qty 20

## 2018-04-26 SURGICAL SUPPLY — 68 items
ADH SKN CLS APL DERMABOND .7 (GAUZE/BANDAGES/DRESSINGS) ×1
APPLIER CLIP 5 13 M/L LIGAMAX5 (MISCELLANEOUS) IMPLANT
APPLIER CLIP ROT 10 11.4 M/L (STAPLE) IMPLANT
APR CLP MED LRG 11.4X10 (STAPLE)
APR CLP MED LRG 5 ANG JAW (MISCELLANEOUS)
BLADE SURG SZ11 CARB STEEL (BLADE) ×2 IMPLANT
CHLORAPREP W/TINT 26ML (MISCELLANEOUS) ×2 IMPLANT
CLIP APPLIE 5 13 M/L LIGAMAX5 (MISCELLANEOUS) IMPLANT
CLIP APPLIE ROT 10 11.4 M/L (STAPLE) IMPLANT
CLIP VESOLOCK LG 6/CT PURPLE (CLIP) IMPLANT
CLIP VESOLOCK MED LG 6/CT (CLIP) IMPLANT
COVER SURGICAL LIGHT HANDLE (MISCELLANEOUS) ×2 IMPLANT
COVER TIP SHEARS 8 DVNC (MISCELLANEOUS) IMPLANT
COVER TIP SHEARS 8MM DA VINCI (MISCELLANEOUS) ×1
COVER WAND RF STERILE (DRAPES) ×1 IMPLANT
DERMABOND ADVANCED (GAUZE/BANDAGES/DRESSINGS) ×1
DERMABOND ADVANCED .7 DNX12 (GAUZE/BANDAGES/DRESSINGS) ×1 IMPLANT
DRAIN CHANNEL 19F RND (DRAIN) ×1 IMPLANT
DRAIN PENROSE 18X1/2 LTX STRL (DRAIN) ×2 IMPLANT
DRAPE ARM DVNC X/XI (DISPOSABLE) ×4 IMPLANT
DRAPE COLUMN DVNC XI (DISPOSABLE) ×1 IMPLANT
DRAPE DA VINCI XI ARM (DISPOSABLE) ×4
DRAPE DA VINCI XI COLUMN (DISPOSABLE) ×1
DRAPE UTILITY XL STRL (DRAPES) ×2 IMPLANT
DRSG TEGADERM 8X12 (GAUZE/BANDAGES/DRESSINGS) ×3 IMPLANT
ELECT REM PT RETURN 15FT ADLT (MISCELLANEOUS) ×2 IMPLANT
ENDOLOOP SUT PDS II  0 18 (SUTURE)
ENDOLOOP SUT PDS II 0 18 (SUTURE) IMPLANT
EVACUATOR SILICONE 100CC (DRAIN) ×1 IMPLANT
GAUZE 4X4 16PLY RFD (DISPOSABLE) ×2 IMPLANT
GLOVE BIO SURGEON STRL SZ7.5 (GLOVE) ×4 IMPLANT
GLOVE BIOGEL PI IND STRL 7.5 (GLOVE) IMPLANT
GLOVE BIOGEL PI INDICATOR 7.5 (GLOVE) ×2
GLOVE ECLIPSE 8.0 STRL XLNG CF (GLOVE) ×3 IMPLANT
GLOVE INDICATOR 8.0 STRL GRN (GLOVE) ×3 IMPLANT
GOWN STRL REUS W/TWL XL LVL3 (GOWN DISPOSABLE) ×10 IMPLANT
KIT BASIN OR (CUSTOM PROCEDURE TRAY) ×2 IMPLANT
MARKER SKIN DUAL TIP RULER LAB (MISCELLANEOUS) ×2 IMPLANT
MESH BIO-A 7X10 SYN MAT (Mesh General) ×1 IMPLANT
NDL INSUFFLATION 14GA 120MM (NEEDLE) ×1 IMPLANT
NEEDLE HYPO 22GX1.5 SAFETY (NEEDLE) ×2 IMPLANT
NEEDLE INSUFFLATION 14GA 120MM (NEEDLE) ×2 IMPLANT
OBTURATOR OPTICAL STANDARD 8MM (TROCAR)
OBTURATOR OPTICAL STND 8 DVNC (TROCAR) IMPLANT
OBTURATOR OPTICALSTD 8 DVNC (TROCAR) IMPLANT
PACK UNIVERSAL I (CUSTOM PROCEDURE TRAY) ×1 IMPLANT
PAD POSITIONING PINK XL (MISCELLANEOUS) ×2 IMPLANT
SCISSORS LAP 5X35 DISP (ENDOMECHANICALS) ×2 IMPLANT
SEAL CANN UNIV 5-8 DVNC XI (MISCELLANEOUS) ×4 IMPLANT
SEAL XI 5MM-8MM UNIVERSAL (MISCELLANEOUS) ×4
SEALER VESSEL DA VINCI XI (MISCELLANEOUS) ×1
SEALER VESSEL EXT DVNC XI (MISCELLANEOUS) ×1 IMPLANT
SET IRRIG TUBING LAPAROSCOPIC (IRRIGATION / IRRIGATOR) ×2 IMPLANT
SOLUTION ANTI FOG 6CC (MISCELLANEOUS) ×2 IMPLANT
SOLUTION ELECTROLUBE (MISCELLANEOUS) ×2 IMPLANT
SPONGE DRAIN TRACH 4X4 STRL 2S (GAUZE/BANDAGES/DRESSINGS) ×1 IMPLANT
STAPLER VISISTAT 35W (STAPLE) IMPLANT
SUT ETHIBOND 0 36 GRN (SUTURE) ×4 IMPLANT
SUT ETHILON 2 0 PS N (SUTURE) ×1 IMPLANT
SUT MNCRL AB 4-0 PS2 18 (SUTURE) ×2 IMPLANT
SUT SILK 0 SH 30 (SUTURE) ×3 IMPLANT
SUT SILK 2 0 SH (SUTURE) ×6 IMPLANT
SYR 20CC LL (SYRINGE) ×2 IMPLANT
TOWEL OR 17X26 10 PK STRL BLUE (TOWEL DISPOSABLE) ×2 IMPLANT
TOWEL OR NON WOVEN STRL DISP B (DISPOSABLE) ×2 IMPLANT
TRAY FOLEY MTR SLVR 16FR STAT (SET/KITS/TRAYS/PACK) ×1 IMPLANT
TROCAR ADV FIXATION 5X100MM (TROCAR) ×2 IMPLANT
TUBING INSUFFLATION 10FT LAP (TUBING) ×2 IMPLANT

## 2018-04-26 NOTE — Anesthesia Preprocedure Evaluation (Signed)
Anesthesia Evaluation  Patient identified by MRN, date of birth, ID band Patient awake    Reviewed: Allergy & Precautions, NPO status , Patient's Chart, lab work & pertinent test results  Airway Mallampati: II  TM Distance: >3 FB Neck ROM: Full    Dental no notable dental hx.    Pulmonary neg pulmonary ROS,    Pulmonary exam normal breath sounds clear to auscultation       Cardiovascular hypertension, Pt. on medications Normal cardiovascular exam Rhythm:Regular Rate:Normal     Neuro/Psych negative neurological ROS  negative psych ROS   GI/Hepatic hiatal hernia, (+)     substance abuse  alcohol use,   Endo/Other  negative endocrine ROS  Renal/GU negative Renal ROS  negative genitourinary   Musculoskeletal negative musculoskeletal ROS (+)   Abdominal   Peds negative pediatric ROS (+)  Hematology negative hematology ROS (+)   Anesthesia Other Findings   Reproductive/Obstetrics negative OB ROS                             Anesthesia Physical Anesthesia Plan  ASA: III  Anesthesia Plan: General   Post-op Pain Management:    Induction: Intravenous, Rapid sequence and Cricoid pressure planned  PONV Risk Score and Plan: 3 and Ondansetron, Dexamethasone, Midazolam and Treatment may vary due to age or medical condition  Airway Management Planned: Oral ETT  Additional Equipment:   Intra-op Plan:   Post-operative Plan: Extubation in OR  Informed Consent: I have reviewed the patients History and Physical, chart, labs and discussed the procedure including the risks, benefits and alternatives for the proposed anesthesia with the patient or authorized representative who has indicated his/her understanding and acceptance.     Dental advisory given  Plan Discussed with: CRNA  Anesthesia Plan Comments:         Anesthesia Quick Evaluation

## 2018-04-26 NOTE — Anesthesia Procedure Notes (Signed)
Procedure Name: Intubation Date/Time: 04/26/2018 7:36 AM Performed by: Niel Hummer, CRNA Pre-anesthesia Checklist: Patient being monitored, Suction available, Emergency Drugs available and Patient identified Patient Re-evaluated:Patient Re-evaluated prior to induction Oxygen Delivery Method: Circle system utilized Preoxygenation: Pre-oxygenation with 100% oxygen Induction Type: IV induction and Cricoid Pressure applied Ventilation: Oral airway inserted - appropriate to patient size and Two handed mask ventilation required Laryngoscope Size: Mac and 4 Grade View: Grade I Tube type: Oral Tube size: 7.5 mm Number of attempts: 1 Airway Equipment and Method: Stylet Placement Confirmation: ETT inserted through vocal cords under direct vision,  breath sounds checked- equal and bilateral and positive ETCO2 Secured at: 22 cm Tube secured with: Tape Dental Injury: Teeth and Oropharynx as per pre-operative assessment

## 2018-04-26 NOTE — Progress Notes (Signed)
Nutrition Education Note  RD consulted for nutrition education regarding patient who is s/p nissen fundoplication.  RD provided handout on Nissen Fundoplication nutrition therapy. Reviewed clear and full liquids. Encouraged pt to avoid caffeine, carbonated beverages [place] of straws.  Provided examples of appropriate foods on a soft diet. Reviewed gas producing foods. Discouraged intake of processed foods and red meat. Recommended use of a liquid multivitamin while following a liquid diet. Reviewed acceptable protein supplements.  RD discussed why it is important for patient to adhere to diet recommendations. Teach back method used.  Expect good compliance.   Body mass index is 27.2 kg/m.  Pt meets criteria for overweight based on current BMI.  Labs and medications reviewed. No further nutrition interventions warranted at this time. If additional nutrition issues arise, please re-consult RD.   Mariana Single RD, LDN Clinical Nutrition Pager # [phone]

## 2018-04-26 NOTE — H&P (Signed)
History of Present Illness The patient is a 68 year old male who presents with a hiatal hernia. Chief Complaint: Giant hiatal hernia  Patient is a 68 year old male who comes in with a 20+ year history of a hiatal hernia. Patient has had history of shortness of breath recently. Patient had a workup of cardiac clearance to rule out any cardiac origin of the shortness of breath. Patient does state that he has some dysphagia. Patient denies any reflux or GERD like symptoms. Patient denies any chronic cough issues. Patient did have a history of constipation.  Patient also states he drinks about 6 ounces of vodka per day. Patient is had no previous abdominal surgeries.  Patient had a previous upper GI which reveals a large hiatal hernia. Patient also in 2018 had a cardiac CT scan which reveals a large type IV hiatal hernia with a hiatal hernia approximately 5 cm. Patient had portion of his colon and stomach and his left chest.    Allergies  No Known Drug Allergies [02/07/2018]: Allergies Reconciled   Medication History  chlordiazePOXIDE HCl (25MG  Capsule, Oral) Active. Lisinopril-hydroCHLOROthiazide (20-12.5MG  Tablet, Oral) Active. Doxycycline Hyclate (100MG  Capsule, Oral) Active.    Review of Systems  General Not Present- Appetite Loss, Chills, Fatigue, Fever, Night Sweats, Weight Gain and Weight Loss. Skin Not Present- Change in Wart/Mole, Dryness, Hives, Jaundice, New Lesions, Non-Healing Wounds, Rash and Ulcer. HEENT Present- Wears glasses/contact lenses. Not Present- Earache, Hearing Loss, Hoarseness, Nose Bleed, Oral Ulcers, Ringing in the Ears, Seasonal Allergies, Sinus Pain, Sore Throat, Visual Disturbances and Yellow Eyes. Breast Not Present- Breast Mass, Breast Pain, Nipple Discharge and Skin Changes. Cardiovascular Not Present- Chest Pain, Difficulty Breathing Lying Down, Leg Cramps, Palpitations, Rapid Heart Rate, Shortness of Breath and Swelling of  Extremities. Gastrointestinal Not Present- Abdominal Pain, Bloating, Bloody Stool, Change in Bowel Habits, Chronic diarrhea, Constipation, Difficulty Swallowing, Excessive gas, Gets full quickly at meals, Hemorrhoids, Indigestion, Nausea, Rectal Pain and Vomiting. Musculoskeletal Not Present- Back Pain, Joint Pain, Joint Stiffness, Muscle Pain, Muscle Weakness and Swelling of Extremities. Neurological Not Present- Decreased Memory, Fainting, Headaches, Numbness, Seizures, Tingling, Tremor, Trouble walking and Weakness. Psychiatric Not Present- Anxiety, Bipolar, Change in Sleep Pattern, Depression, Fearful and Frequent crying. Endocrine Not Present- Cold Intolerance, Excessive Hunger, Hair Changes, Heat Intolerance and New Diabetes. Hematology Not Present- Blood Thinners, Easy Bruising, Excessive bleeding, Gland problems, HIV and Persistent Infections. All other systems negative  BP (!) 148/95   Pulse (!) 102   Temp 97.6 F (36.4 C) (Oral)   Resp 16   Ht 5\' 11"  (1.803 m)   Wt 88.5 kg   SpO2 98%   BMI 27.20 kg/m     Physical Exam The physical exam findings are as follows: Note:Constitutional: No acute distress, conversant, appears stated age  Eyes: Anicteric sclerae, moist conjunctiva, no lid lag  Neck: No thyromegaly, trachea midline, no cervical lymphadenopathy  Lungs: Clear to auscultation biilaterally, normal respiratory effot  Cardiovascular: regular rate & rhythm, no murmurs, no peripheal edema, pedal pulses 2+  GI: Soft, no masses or hepatosplenomegaly, non-tender to palpation  MSK: Normal gait, no clubbing cyanosis, edema  Skin: No rashes, palpation reveals normal skin turgor  Psychiatric: Appropriate judgment and insight, oriented to person, place, and time    Assessment & Plan  HIATAL HERNIA (K44.9) Impression: 68 year old male with a history of giant hiatal hernia, and alcohol dependence. Patient has a long history of dysphagia, shortness of  breath.  1. Will proceed to the operating room for a  robotic hiatal hernia repair with mesh and fundoplication  2. Discussed with patient the risks and benefits of the procedure to include but not limited to: Infection, bleeding, damage to structures, possible pneumothorax, possible recurrence. The patient voiced understanding and wishes to proceed.  3. Did discuss the patient that minimizing and stopping his alcohol intake between the surgery would be ideal to help prevent any issues with withdrawal symptoms in the hospital.

## 2018-04-26 NOTE — Transfer of Care (Signed)
Immediate Anesthesia Transfer of Care Note  Patient: Ruben [name]  Procedure(s) Performed: XI ROBOTIC HIATAL HERNIA REPAIR WITH MESH AND  FUNDOPLICATION ERAS PATHWAY (N/A )  Patient Location: PACU  Anesthesia Type:General  Level of Consciousness: awake, alert  and oriented  Airway & Oxygen Therapy: Patient Spontanous Breathing and Patient connected to face mask oxygen  Post-op Assessment: Report given to RN and Post -op Vital signs reviewed and stable  Post vital signs: Reviewed and stable  Last Vitals:  Vitals Value Taken Time  BP 94/57 04/26/2018 11:17 AM  Temp    Pulse 97 04/26/2018 11:26 AM  Resp 12 04/26/2018 11:26 AM  SpO2 100 % 04/26/2018 11:26 AM  Vitals shown include unvalidated device data.  Last Pain:  Vitals:   04/26/18 0600  TempSrc: Oral      Patients Stated Pain Goal: 3 (09/09/54 1537)  Complications: No apparent anesthesia complications

## 2018-04-26 NOTE — Discharge Instructions (Signed)
EATING AFTER YOUR ESOPHAGEAL SURGERY (Stomach Fundoplication, Hiatal Hernia repair, Achalasia surgery, etc)  ######################################################################  EAT Start with a pureed / full liquid diet (see below) Gradually transition to a high fiber diet with a fiber supplement over the next month after discharge.    WALK Walk an hour a day.  Control your pain to do that.    CONTROL PAIN Control pain so that you can walk, sleep, tolerate sneezing/coughing, go up/down stairs.  HAVE A BOWEL MOVEMENT DAILY Keep your bowels regular to avoid problems.  OK to try a laxative to override constipation.  OK to use an antidairrheal to slow down diarrhea.  Call if not better after 2 tries  CALL IF YOU HAVE PROBLEMS/CONCERNS Call if you are still struggling despite following these instructions. Call if you have concerns not answered by these instructions  ######################################################################   After your esophageal surgery, expect some sticking with swallowing over the next 1-2 months.    If food sticks when you eat, it is called "dysphagia".  This is due to swelling around your esophagus at the wrap & hiatal diaphragm repair.  It will gradually ease off over the next few months.  To help you through this temporary phase, we start you out on a pureed (blenderized) diet.  Your first meal in the hospital was thin liquids.  You should have been given a pureed diet by the time you left the hospital.  We ask patients to stay on a pureed diet for the first 2-3 weeks to avoid anything getting "stuck" near your recent surgery.  Don't be alarmed if your ability to swallow doesn't progress according to this plan.  Everyone is different and some diets can advance more or less quickly.     Some BASIC RULES to follow are:  Maintain an upright position whenever eating or drinking.  Take small bites - just a teaspoon size bite at a time.  Eat slowly.   It may also help to eat only one food at a time.  Consider nibbling through smaller, more frequent meals & avoid the urge to eat BIG meals  Do not push through feelings of fullness, nausea, or bloatedness  Do not mix solid foods and liquids in the same mouthful  Try not to "wash foods down" with large gulps of liquids.  Avoid carbonated (bubbly/fizzy) drinks.    Avoid foods that make you feel gassy or bloated.  Start with bland foods first.  Wait on trying greasy, fried, or spicy meals until you are tolerating more bland solids well.  Understand that it will be hard to burp and belch at first.  This gradually improves with time.  Expect to be more gassy/flatulent/bloated initially.  Walking will help your body manage it better.  Consider using medications for bloating that contain simethicone such as  Maalox or Gas-X   Eat in a relaxed atmosphere & minimize distractions.  Avoid talking while eating.    Do not use straws.  Following each meal, sit in an upright position (90 degree angle) for 60 to 90 minutes.  Going for a short walk can help as well  If food does stick, don't panic.  Try to relax and let the food pass on its own.  Sipping WARM LIQUID such as strong hot black tea can also help slide it down.   Be gradual in changes & use common sense:  -If you easily tolerating a certain "level" of foods, advance to the next level gradually -If you are  having trouble swallowing a particular food, then avoid it.   -If food is sticking when you advance your diet, go back to thinner previous diet (the lower LEVEL) for 1-2 days.  LEVEL 1 = PUREED DIET  Do for the first 2 WEEKS AFTER SURGERY  -Foods in this group are pureed or blenderized to a smooth, mashed potato-like consistency.  -If necessary, the pureed foods can keep their shape with the addition of a thickening agent.   -Meat should be pureed to a smooth, pasty consistency.  Hot broth or gravy may be added to the pureed  meat, approximately 1 oz. of liquid per 3 oz. serving of meat. -CAUTION:  If any foods do not puree into a smooth consistency, swallowing will be more difficult.  (For example, nuts or seeds sometimes do not blend well.)  Hot Foods Cold Foods  Pureed scrambled eggs and cheese Pureed cottage cheese  Baby cereals Thickened juices and nectars  Thinned cooked cereals (no lumps) Thickened milk or eggnog  Pureed [place] toast or pancakes Ensure  Mashed potatoes Ice cream  Pureed parsley, au gratin, scalloped potatoes, candied sweet potatoes Fruit or [place] ice, sherbet  Pureed buttered or alfredo noodles Plain yogurt  Pureed vegetables (no corn or peas) Instant breakfast  Pureed soups and creamed soups Smooth pudding, mousse, custard  Pureed scalloped apples Whipped gelatin  Gravies Sugar, syrup, honey, jelly  Sauces, cheese, tomato, barbecue, white, creamed Cream  Any baby food Creamer  Alcohol in moderation (not beer or champagne) Margarine  Coffee or tea Mayonnaise   Ketchup, mustard   Apple sauce   SAMPLE MENU:  PUREED DIET Breakfast Lunch Dinner   [place]ange juice, 1/2 cup  Cream of wheat, 1/2 cup  Pineapple juice, 1/2 cup  Pureed [place], barley soup, 3/4 cup  Pureed [place] chicken, 3 oz   Scrambled eggs, mashed or blended with cheese, 1/2 cup  Tea or coffee, 1 cup   Whole milk, 1 cup   Non-dairy creamer, 2 Tbsp.  Mashed potatoes, 1/2 cup  Pureed cooled broccoli, 1/2 cup  Apple sauce, 1/2 cup  Coffee or tea  Mashed potatoes, 1/2 cup  Pureed spinach, 1/2 cup  Frozen yogurt, 1/2 cup  Tea or coffee      LEVEL 2 = SOFT DIET  After your first 2 weeks, you can advance to a soft diet.   Keep on this diet until everything goes down easily.  Hot Foods Cold Foods  White fish Cottage cheese  Stuffed fish Junior baby fruit  Baby food meals Semi thickened juices  Minced soft cooked, scrambled, poached eggs nectars  Souffle & omelets Ripe mashed bananas  Cooked  cereals Canned fruit, pineapple sauce, milk  potatoes Milkshake  Buttered or Alfredo noodles Custard  Cooked cooled vegetable Puddings, including tapioca  Sherbet Yogurt  Vegetable soup or alphabet soup Fruit ice, [place] ice  Gravies Whipped gelatin  Sugar, syrup, honey, jelly Junior baby desserts  Sauces:  Cheese, creamed, barbecue, tomato, white Cream  Coffee or tea Margarine   SAMPLE MENU:  LEVEL 2 Breakfast Lunch Dinner   Orange juice, 1/2 cup  Oatmeal, 1/2 cup  Scrambled eggs with cheese, 1/2 cup  Decaffeinated tea, 1 cup  Whole milk, 1 cup  Non-dairy creamer, 2 Tbsp  Pineapple juice, 1/2 cup  Minced beef, 3 oz  Gravy, 2 Tbsp  Mashed potatoes, 1/2 cup  Minced fresh broccoli, 1/2 cup  Applesauce, 1/2 cup  Coffee, 1 cup  [place], barley soup, 3/4 cup  Minced Hawaiian chicken, 3 oz  Mashed potatoes, 1/2 cup  Cooked spinach, 1/2 cup  Frozen yogurt, 1/2 cup  Non-dairy creamer, 2 Tbsp      LEVEL 3 = CHOPPED DIET  -After all the foods in level 2 (soft diet) are passing through well you should advance up to more chopped foods.  -It is still important to cut these foods into small pieces and eat slowly.  Hot Foods Cold Foods  Poultry Cottage cheese  Chopped Swedish meatballs Yogurt  Meat salads (ground or flaked meat) Milk  Flaked fish (tuna) Milkshakes  Poached or scrambled eggs Soft, cold, dry cereal  Souffles and omelets Fruit juices or nectars  Cooked cereals Chopped canned fruit  Chopped [place] toast or pancakes Canned fruit cocktail  Noodles or pasta (no rice) Pudding, mousse, custard  Cooked vegetables (no frozen peas, corn, or mixed vegetables) Green salad  Canned small sweet peas Ice cream  Creamed soup or vegetable soup Fruit ice, [place] ice  Pureed vegetable soup or alphabet soup Non-dairy creamer  Ground scalloped apples Margarine  Gravies Mayonnaise  Sauces:  Cheese, creamed, barbecue, tomato, white Ketchup  Coffee or tea Mustard    SAMPLE MENU:  LEVEL 3 Breakfast Lunch Dinner   Orange juice, 1/2 cup  Oatmeal, 1/2 cup  Scrambled eggs with cheese, 1/2 cup  Decaffeinated tea, 1 cup  Whole milk, 1 cup  Non-dairy creamer, 2 Tbsp  Ketchup, 1 Tbsp  Margarine, 1 tsp  Salt, 1/4 tsp  Sugar, 2 tsp  Pineapple juice, 1/2 cup  Ground beef, 3 oz  Gravy, 2 Tbsp  Mashed potatoes, 1/2 cup  Cooked spinach, 1/2 cup  Applesauce, 1/2 cup  Decaffeinated coffee  Whole milk  Non-dairy creamer, 2 Tbsp  Margarine, 1 tsp  Salt, 1/4 tsp  Pureed [place], barley soup, 3/4 cup  Barbecue chicken, 3 oz  Mashed potatoes, 1/2 cup  Ground fresh broccoli, 1/2 cup  Frozen yogurt, 1/2 cup  Decaffeinated tea, 1 cup  Non-dairy creamer, 2 Tbsp  Margarine, 1 tsp  Salt, 1/4 tsp  Sugar, 1 tsp    LEVEL 4:  REGULAR FOODS  -Foods in this group are soft, moist, regularly textured foods.   -This level includes meat and breads, which tend to be the hardest things to swallow.   -Eat very slowly, chew well and continue to avoid carbonated drinks. -most people are at this level in 4-6 weeks  Hot Foods Cold Foods  Baked fish or skinned Soft cheeses - cottage cheese  Souffles and omelets Cream cheese  Eggs Yogurt  Stuffed shells Milk  Spaghetti with meat sauce Milkshakes  Cooked cereal Cold dry cereals (no nuts, dried fruit, coconut)  [place] toast or pancakes Crackers  Buttered toast Fruit juices or nectars  Noodles or pasta (no rice) Canned fruit  Potatoes (all types) Ripe bananas  Soft, cooked vegetables (no corn, lima, or baked beans) Peeled, ripe, fresh fruit  Creamed soups or vegetable soup Cakes (no nuts, dried fruit, coconut)  Canned chicken noodle soup Plain doughnuts  Gravies Ice cream  Bacon dressing Pudding, mousse, custard  Sauces:  Cheese, creamed, barbecue, tomato, white Fruit ice, [place] ice, sherbet  Decaffeinated tea or coffee Whipped gelatin  Pork chops Regular gelatin   Canned fruited  gelatin molds   Sugar, syrup, honey, jam, jelly   Cream   Non-dairy   Margarine   Oil   Mayonnaise   Ketchup   Mustard   TROUBLESHOOTING IRREGULAR BOWELS  1) Avoid extremes of bowel  movements (no bad constipation/diarrhea)  °2) Miralax 17gm mixed in 8oz. water or juice-daily. May use BID as needed.  °3) Gas-x,Phazyme, etc. as needed for gas & bloating.  °4) Soft,bland diet. No spicy,greasy,fried foods.  °5) Prilosec over-the-counter as needed  °6) May hold gluten/wheat products from diet to see if symptoms improve.  °7) May try probiotics (Align, Activa, etc) to help calm the bowels down  °7) If symptoms become worse call back immediately. ° ° ° °If you have any questions please call our office at [facility]: [phone]. ° °

## 2018-04-26 NOTE — Anesthesia Postprocedure Evaluation (Signed)
Anesthesia Post Note  Patient: Ruben [name]  Procedure(s) Performed: XI ROBOTIC HIATAL HERNIA REPAIR WITH MESH AND  FUNDOPLICATION ERAS PATHWAY (N/A )     Patient location during evaluation: Endoscopy Anesthesia Type: General Level of consciousness: awake and alert Pain management: pain level controlled Vital Signs Assessment: post-procedure vital signs reviewed and stable Respiratory status: spontaneous breathing, nonlabored ventilation, respiratory function stable and patient connected to nasal cannula oxygen Cardiovascular status: stable and blood pressure returned to baseline Postop Assessment: no apparent nausea or vomiting Anesthetic complications: no    Last Vitals:  Vitals:   04/26/18 1200 04/26/18 1224  BP: 129/66 (!) 136/91  Pulse: 92 89  Resp: 16 16  Temp: 36.6 C 36.7 C  SpO2: 96% 96%    Last Pain:  Vitals:   04/26/18 1224  TempSrc: Oral  PainSc:                  Montez Hageman

## 2018-04-26 NOTE — Op Note (Signed)
04/26/2018  11:02 AM  PATIENT:  Ruben [name]  68 y.o. male  PRE-OPERATIVE DIAGNOSIS:  hiatal hernia  POST-OPERATIVE DIAGNOSIS:  hiatal hernia type IV  PROCEDURE:  Procedure(s): XI ROBOTIC HIATAL HERNIA REPAIR WITH MESH AND  TOUPET FUNDOPLICATION ERAS PATHWAY (N/A)  SURGEON:  Surgeon(s) and Role:    * Ralene Ok, MD - Primary    Michael Boston, MD - Assisting  ANESTHESIA:   local and general  EBL:  minimal   BLOOD ADMINISTERED:none  DRAINS: (8fR) Jackson-Pratt drain(s) with closed bulb suction in the Mediastinum   LOCAL MEDICATIONS USED:  BUPIVICAINE   SPECIMEN:  No Specimen  DISPOSITION OF SPECIMEN:  N/A  COUNTS:  YES  TOURNIQUET:  * No tourniquets in log *  DICTATION: .Dragon Dictation The patient was taken back to the operating room and placed in the supine position with bilateral SCDs in place. The patient was prepped and draped in the usual sterile fashion. After appropriate antibiotics were confirmed a timeout was called and all facts were verified.   A Veress needle technique was used to insufflate the abdomen to 15 mm of mercury the paramedian stab incision. Subsequent to this an 8 mm trocar was introduced as was a 8 millimeter camera. At this time the subsequent robotic trochars x3, were then placed adjacent to this trocar approximately 8-10 cm away. Each trocar was inserted under direct visualization, there were total of 4 trochars. The assistant trocar was then placed in the right lower quadrant under direct visualization. The Nathanson retractor was then visualized inserted into the abdomen and the incision just to the left of the falciform ligament. This was then placed to retract the liver appropriately. At this time the patient was positioned in reverse Trendelenburg.   At this time the robot patient cart was brought to the bedside and placed in good position and the arms were docked to the trochars appropriately. At this time I proceeded to incised the  gastrohepatic ligament.  At this time I proceeded to mobilize the stomach inferiorly and visualize the right crus. The peritoneum over the right crus was incised and right crus was identified.  There was colon and stomach within the chest cavity.  Due to the suction phenomenon I proceed to incise the hernia sac around the anterior hiatus.  We proceeded to the Left side of the hiatus and the left crus.  The hernia sac came down nicely as did the phrenoesophageal fat pad.   I proceeded to dissect this inferiorly until the left crus was seen joining the right crus. Once the right crus was adequately dissected we turned our to the left crus which was dissected away. This required traction of the stomach to the right side. Once this was visualized we then proceeded to circumferentially dissect the esophagus away from the surrounding tissue. There was a giant-sized hiatal hernia seen. I mobilized the esophagus cephalad approximately 6-7 cm proximally into the hiatus.  EAch vagus nerve was seen and preserved.  I continued by clearing away the surrounding tissue.   At this time we turned our attention to the greater curvature the stomach and the omentum was mobilized using the robotic vessel sealer. This was taken up to the greater curvature to the hiatus. This mobilized the entire greater curvature to allow mobilization and the wrap. I then proceeded to bring the greater curvature the stomach posterior to the esophagus, and a shoeshine technique was used to evaluate the mobilization of the greater curvature.   Due  to tension at the closure I proceeded to incise the right crus in a relaxing fashion.  This made the closure come together in a tension free fashion.    At this time I proceeded to close the hiatus using figure of 8 0 Ethibonds x 3. This brought together the hiatal closure without undue stricture to the esophagus.   A piece of Gore Bio A hiatal mesh was placed over the hiatal closure and sutured to the  crus using 0 Ethibond sutures x 5.  This covered the relaxing incision adquately.  At this time the greater curvature was brought around the esophagus and sutured using 0 silk sutures interrupted fashion approximately 1 cm apart x3.  This was down in a Toupet fashion. A left collar stitch was then used to gastropexy the stomach from the wrap to the diaphragm just lateral to the left crus as.  A second collar stitch was placed from the wrap to the right crus.The wrap lay loose with no strangulation of the esophagus.  At this time the robot was undocked. The liver trocar was removed. At this time insufflation was evacuated. Skin was reapproximated for Monocryl subcuticular fashion. The skin was then dressed with Dermabond. The patient tolerated the procedure well and was taken to the recovery room in stable condition.    PLAN OF CARE: Admit for overnight observation  PATIENT DISPOSITION:  PACU - hemodynamically stable.   Delay start of Pharmacological VTE agent (>24hrs) due to surgical blood loss or risk of bleeding: yes

## 2018-04-27 ENCOUNTER — Encounter (HOSPITAL_COMMUNITY): Payer: Self-pay | Admitting: *Deleted

## 2018-04-27 ENCOUNTER — Observation Stay (HOSPITAL_COMMUNITY): Payer: Medicare Other

## 2018-04-27 DIAGNOSIS — K449 Diaphragmatic hernia without obstruction or gangrene: Secondary | ICD-10-CM | POA: Diagnosis not present

## 2018-04-27 DIAGNOSIS — Z23 Encounter for immunization: Secondary | ICD-10-CM | POA: Diagnosis not present

## 2018-04-27 DIAGNOSIS — I10 Essential (primary) hypertension: Secondary | ICD-10-CM | POA: Diagnosis not present

## 2018-04-27 LAB — BASIC METABOLIC PANEL
Anion gap: 8 (ref 5–15)
BUN: 21 mg/dL (ref 8–23)
CO2: 25 mmol/L (ref 22–32)
Calcium: 7.8 mg/dL — ABNORMAL LOW (ref 8.9–10.3)
Chloride: 102 mmol/L (ref 98–111)
Creatinine, Ser: 1.21 mg/dL (ref 0.61–1.24)
GFR calc Af Amer: >60 mL/min (ref >60)
GFR calc non Af Amer: >60 mL/min (ref >60)
GLUCOSE: 142 mg/dL — AB (ref 70–99)
Potassium: 4.2 mmol/L (ref 3.5–5.1)
Sodium: 135 mmol/L (ref 135–145)

## 2018-04-27 LAB — HIV ANTIBODY (ROUTINE TESTING W REFLEX): HIV Screen 4th Generation wRfx: NONREACTIVE

## 2018-04-27 MED ORDER — IOPAMIDOL (ISOVUE-300) INJECTION 61%
INTRAVENOUS | Status: AC
  Administered 2018-04-27: 100 mL via ORAL
  Filled 2018-04-27: qty 100

## 2018-04-27 MED ORDER — PNEUMOCOCCAL VAC POLYVALENT 25 MCG/0.5ML IJ INJ
0.5000 mL | INJECTION | INTRAMUSCULAR | Status: AC
  Administered 2018-04-28: 0.5 mL via INTRAMUSCULAR
  Filled 2018-04-27: qty 0.5

## 2018-04-27 MED ORDER — ZOLPIDEM TARTRATE 5 MG PO TABS: 5.0000 mg | ORAL_TABLET | Freq: Every day | ORAL | Status: DC

## 2018-04-27 NOTE — Progress Notes (Signed)
[place] 67yoM,Fundoplication, by Dr. Rosendo Gros on Dysphasia I diet, Is asking for sleep aid. Thanks

## 2018-04-27 NOTE — Progress Notes (Signed)
1 Day Post-Op   Subjective/Chief Complaint: Feels well. Some gas pain but mild.    Objective: Vital signs in last 24 hours: Temp:  [97.5 F (36.4 C)-99 F (37.2 C)] 97.5 F (36.4 C) (01/18 0800) Pulse Rate:  [74-94] 86 (01/18 0800) Resp:  [12-18] 16 (01/18 0559) BP: (94-136)/(57-95) 116/78 (01/18 0800) SpO2:  [93 %-100 %] 95 % (01/18 0800)    Intake/Output from previous day: 01/17 0701 - 01/18 0700 In: 3379.3 [I.V.:3279.3; IV Piggyback:100] Out: 6433 [Urine:1200; Drains:110; Blood:30] Intake/Output this shift: No intake/output data recorded.  General appearance: alert and cooperative Resp: clear to auscultation bilaterally Cardio: regular rate and rhythm GI: soft, non-tender; bowel sounds normal; no masses,  no organomegaly and JP serosanguinous Skin: Skin color, texture, turgor normal. No rashes or lesions Neurologic: Grossly normal Incision/Wound: c/d/i  Lab Results:  No results for input(s): WBC, HGB, HCT, PLT in the last 72 hours. BMET Recent Labs    04/27/18 0431  NA 135  K 4.2  CL 102  CO2 25  GLUCOSE 142*  BUN 21  CREATININE 1.21  CALCIUM 7.8*   PT/INR No results for input(s): LABPROT, INR in the last 72 hours. ABG No results for input(s): PHART, HCO3 in the last 72 hours.  Invalid input(s): PCO2, PO2  Studies/Results: No results found.  Anti-infectives: Anti-infectives (From admission, onward)   Start     Dose/Rate Route Frequency Ordered Stop   04/26/18 0600  ceFAZolin (ANCEF) IVPB 2g/100 mL premix     2 g 200 mL/hr over 30 Minutes Intravenous On call to O.R. 04/26/18 0556 04/26/18 0807      Assessment/Plan: s/p Procedure(s): XI ROBOTIC HIATAL HERNIA REPAIR WITH MESH AND  FUNDOPLICATION ERAS PATHWAY (N/A) Upper GI this morning. Pending results will advance to clears then puree diet. Anticipate discharge tomorrow. Encouraged IS and ambulation.   LOS: 0 days    Clovis Riley 04/27/2018

## 2018-04-27 NOTE — Progress Notes (Signed)
I have reviewed and concur with this student's documentation.   

## 2018-04-27 NOTE — Plan of Care (Signed)
Patient up in bed this morning; has just returned from ambulating around unit with night nurse.  States mild pain at this time but nothing too bad. Hoping for swallow test soon so he can advance diet. Will continue to monitor.

## 2018-04-28 DIAGNOSIS — I10 Essential (primary) hypertension: Secondary | ICD-10-CM | POA: Diagnosis not present

## 2018-04-28 DIAGNOSIS — Z23 Encounter for immunization: Secondary | ICD-10-CM | POA: Diagnosis not present

## 2018-04-28 DIAGNOSIS — K449 Diaphragmatic hernia without obstruction or gangrene: Secondary | ICD-10-CM | POA: Diagnosis not present

## 2018-04-28 MED ORDER — ACETAMINOPHEN 325 MG PO TABS: 650.0000 mg | ORAL_TABLET | Freq: Four times a day (QID) | ORAL | Status: DC

## 2018-04-28 MED ORDER — METHOCARBAMOL 500 MG PO TABS
500.0000 mg | ORAL_TABLET | Freq: Four times a day (QID) | ORAL | Status: DC | PRN
  Administered 2018-04-28: 500 mg via ORAL
  Filled 2018-04-28: qty 1

## 2018-04-28 MED ORDER — METHOCARBAMOL 500 MG PO TABS: 500.0000 mg | ORAL_TABLET | Freq: Four times a day (QID) | ORAL | 0 refills | Status: DC | PRN

## 2018-04-28 MED ORDER — METOCLOPRAMIDE HCL 5 MG/ML IJ SOLN
10.0000 mg | Freq: Four times a day (QID) | INTRAMUSCULAR | Status: DC
  Administered 2018-04-28: 10 mg via INTRAVENOUS
  Filled 2018-04-28: qty 2

## 2018-04-28 MED ORDER — ONDANSETRON 4 MG PO TBDP: 4.0000 mg | ORAL_TABLET | Freq: Three times a day (TID) | ORAL | 0 refills | Status: DC | PRN

## 2018-04-28 MED ORDER — BISACODYL 10 MG RE SUPP: 10.0000 mg | Freq: Every day | RECTAL | Status: DC | PRN

## 2018-04-28 MED ORDER — DOCUSATE SODIUM 100 MG PO CAPS
100.0000 mg | ORAL_CAPSULE | Freq: Two times a day (BID) | ORAL | Status: DC
  Administered 2018-04-28: 100 mg via ORAL
  Filled 2018-04-28: qty 1

## 2018-04-28 MED ORDER — ACETAMINOPHEN 325 MG PO TABS
650.0000 mg | ORAL_TABLET | Freq: Four times a day (QID) | ORAL | Status: DC
  Administered 2018-04-28: 650 mg via ORAL
  Filled 2018-04-28: qty 2

## 2018-04-28 MED ORDER — DOCUSATE SODIUM 100 MG PO CAPS: 100.0000 mg | ORAL_CAPSULE | Freq: Two times a day (BID) | ORAL | 0 refills | Status: DC

## 2018-04-28 NOTE — Progress Notes (Signed)
Patient was able to have flatus today since surgery.  Stated that at home hr normally has to take magnesium in order to have a BM.  He ambulated this morning and was slightly Short of breath, and had some pain.  but sats were 95% on r/a.  Encourged to use IS and to take pain medication as needed so he will be able to ambulate and do ADLs as needed.

## 2018-04-28 NOTE — Progress Notes (Signed)
Pt reports that he is feeling well and would like to be discharged home. Message to Dr. Kae Heller. PIV removed from [place]. Instructed and demonstrated to pt and caregiver how to empty/recharge JP drain and to apply new dressing daily. Discharge instructions given and pt/caregiver deny any questions or concerns

## 2018-04-28 NOTE — Progress Notes (Signed)
2 Days Post-Op   Subjective/Chief Complaint: Still having some gas pain. Drank a fair amount of water but feels like he is having trouble swallowing- things go down ok but sticking and he has a lot of belching, has not tried any puree yet. UGI looks ok, noted mild esophageal stasis and spasm   Objective: Vital signs in last 24 hours: Temp:  [97.7 F (36.5 C)-99.3 F (37.4 C)] 98.6 F (37 C) (01/19 0529) Pulse Rate:  [85-102] 94 (01/19 0529) Resp:  [16] 16 (01/19 0529) BP: (113-129)/(66-79) 129/79 (01/19 0529) SpO2:  [90 %-92 %] 92 % (01/19 0529)    Intake/Output from previous day: 01/18 0701 - 01/19 0700 In: 1939.4 [P.O.:840; I.V.:1099.4] Out: 10 [Drains:10] Intake/Output this shift: Total I/O In: 460.5 [I.V.:460.5] Out: -   General appearance: alert and cooperative Resp: clear to auscultation bilaterally Cardio: regular rate and rhythm GI: soft, non-tender; bowel sounds normal; no masses,  no organomegaly and JP serosanguinous Skin: Skin color, texture, turgor normal. No rashes or lesions Neurologic: Grossly normal Incision/Wound: c/d/i  Lab Results:  No results for input(s): WBC, HGB, HCT, PLT in the last 72 hours. BMET Recent Labs    04/27/18 0431  NA 135  K 4.2  CL 102  CO2 25  GLUCOSE 142*  BUN 21  CREATININE 1.21  CALCIUM 7.8*   PT/INR No results for input(s): LABPROT, INR in the last 72 hours. ABG No results for input(s): PHART, HCO3 in the last 72 hours.  Invalid input(s): PCO2, PO2  Studies/Results: Dg Esophagus Inc Scout Chest & Delayed Img Single Cm (ba Or Sol)  Result Date: 04/27/2018 CLINICAL DATA:  Status post Nissen fundoplication EXAM: ESOPHOGRAM/BARIUM SWALLOW TECHNIQUE: Single contrast examination was performed using water-soluble contrast. FLUOROSCOPY TIME:  Fluoroscopy Time:  54 seconds Radiation Exposure Index (if provided by the fluoroscopic device): 29.7 mGy Number of Acquired Spot Images: 5 COMPARISON:  12/26/2016 CT, 01/10/2018  upper GI series FINDINGS: Single-contrast water-soluble esophagram performed following the hernia repair with fundoplication. Patient is status post Nissen fundoplication repair. Surgical drain noted in the lower chest region. Esophagus is patent. GE junction repair has mild luminal narrowing but patent. Contrast passes through the GE junction into the stomach. There is mild esophageal stasis and spasm. No evidence of extravasation or leakage. IMPRESSION: Status post fundoplication repair of the large hiatal hernia without evidence of obstruction or leakage. Mild esophageal stasis and spasm. Electronically Signed   By: Jerilynn Mages.  Shick M.D.   On: 04/27/2018 10:09    Anti-infectives: Anti-infectives (From admission, onward)   Start     Dose/Rate Route Frequency Ordered Stop   04/26/18 0600  ceFAZolin (ANCEF) IVPB 2g/100 mL premix     2 g 200 mL/hr over 30 Minutes Intravenous On call to O.R. 04/26/18 0556 04/26/18 0807      Assessment/Plan: s/p Procedure(s): XI ROBOTIC HIATAL HERNIA REPAIR WITH MESH AND  FUNDOPLICATION ERAS PATHWAY (N/A) Try puree diet today Continue pulm toilet/ mobilize Will add scheduled reglan, tylenol, robaxin for pain control   LOS: 0 days    Clovis Riley 04/28/2018

## 2018-04-28 NOTE — Discharge Summary (Signed)
Physician Discharge Summary  Patient ID: Ruben [name] MRN: [id number] DOB/AGE: 1950/09/10 68 y.o.  Admit date: 04/26/2018 Discharge date: 04/28/2018  Admission Diagnoses: paraesophageal hernia  Discharge Diagnoses:  Active Problems:   S/P Nissen fundoplication (without gastrostomy tube) procedure   Discharged Condition:  good  Hospital Course: Admitted for support and observation following surgery. Progressed well. POD 1 esophagram reveals intact repair and no concern for leak, some esophageal dysmotility. Patient able to tolerate a puree diet by pod-2, though noting some sticking of food on the way down. Pain was well controlled with minimal medication. He was using IS and walking in the halls.   Discharge Exam: Blood pressure 132/80, pulse (!) 107, temperature 98.4 F (36.9 C), temperature source Oral, resp. rate 15, height 5\' 11"  (1.803 m), weight 88.5 kg, SpO2 97 %. See rounding note  Disposition: Discharge disposition: 01-Home or Self Care       Discharge Instructions    Call MD for:  difficulty breathing, headache or visual disturbances   Complete by:  As directed    Call MD for:  extreme fatigue   Complete by:  As directed    Call MD for:  persistant dizziness or light-headedness   Complete by:  As directed    Call MD for:  persistant nausea and vomiting   Complete by:  As directed    Call MD for:  redness, tenderness, or signs of infection (pain, swelling, redness, odor or green/yellow discharge around incision site)   Complete by:  As directed    Call MD for:  severe uncontrolled pain   Complete by:  As directed    Call MD for:  temperature >100.4   Complete by:  As directed    Increase activity slowly   Complete by:  As directed    Lifting restrictions   Complete by:  As directed    No lifting > 20lb, avoid strenuous pushing/pulling etc until about 6 weeks after surgery     Allergies as of 04/28/2018   No Known Allergies     Medication List    TAKE  these medications   acetaminophen 325 MG tablet Commonly known as:  TYLENOL Take 2 tablets (650 mg total) by mouth every 6 (six) hours. Take scheduled for the next 2-3 days, then as needed for pain   docusate sodium 100 MG capsule Commonly known as:  COLACE Take 1 capsule (100 mg total) by mouth 2 (two) times daily. Ok to stop taking if having loose bowel movements   lisinopril-hydrochlorothiazide 20-12.5 MG tablet Commonly known as:  PRINZIDE,ZESTORETIC Take 2 tablets by mouth daily.   methocarbamol 500 MG tablet Commonly known as:  ROBAXIN Take 1 tablet (500 mg total) by mouth every 6 (six) hours as needed for muscle spasms.   ondansetron 4 MG disintegrating tablet Commonly known as:  ZOFRAN ODT Take 1 tablet (4 mg total) by mouth every 8 (eight) hours as needed for nausea or vomiting.   traZODone 100 MG tablet Commonly known as:  DESYREL Take 100 mg by mouth at bedtime as needed for sleep.      Follow-up Information    Ralene Ok, MD. Schedule an appointment as soon as possible for a visit in 2 weeks.   Specialty:  General Surgery Why:  For wound re-check Contact information: [address] [place] [address] [phone]           Signed: Clovis Riley 04/28/2018, 2:44 PM

## 2018-06-27 DIAGNOSIS — R7989 Other specified abnormal findings of blood chemistry: Secondary | ICD-10-CM | POA: Diagnosis not present

## 2018-06-27 DIAGNOSIS — R7303 Prediabetes: Secondary | ICD-10-CM | POA: Diagnosis not present

## 2018-06-27 DIAGNOSIS — R5383 Other fatigue: Secondary | ICD-10-CM | POA: Diagnosis not present

## 2018-06-27 DIAGNOSIS — Z9889 Other specified postprocedural states: Secondary | ICD-10-CM | POA: Diagnosis not present

## 2018-08-23 DIAGNOSIS — N189 Chronic kidney disease, unspecified: Secondary | ICD-10-CM | POA: Diagnosis not present

## 2018-08-23 DIAGNOSIS — E559 Vitamin D deficiency, unspecified: Secondary | ICD-10-CM | POA: Diagnosis not present

## 2018-08-23 DIAGNOSIS — D519 Vitamin B12 deficiency anemia, unspecified: Secondary | ICD-10-CM | POA: Diagnosis not present

## 2018-08-23 DIAGNOSIS — Z5181 Encounter for therapeutic drug level monitoring: Secondary | ICD-10-CM | POA: Diagnosis not present

## 2018-08-23 DIAGNOSIS — Z0001 Encounter for general adult medical examination with abnormal findings: Secondary | ICD-10-CM | POA: Diagnosis not present

## 2018-08-23 DIAGNOSIS — E782 Mixed hyperlipidemia: Secondary | ICD-10-CM | POA: Diagnosis not present

## 2018-08-23 DIAGNOSIS — R739 Hyperglycemia, unspecified: Secondary | ICD-10-CM | POA: Diagnosis not present

## 2018-11-23 ENCOUNTER — Other Ambulatory Visit: Payer: Self-pay

## 2018-11-23 ENCOUNTER — Emergency Department (HOSPITAL_COMMUNITY): Payer: Medicare Other

## 2018-11-23 ENCOUNTER — Emergency Department (HOSPITAL_COMMUNITY)
Admission: EM | Admit: 2018-11-23 | Discharge: 2018-11-24 | Disposition: A | Discharge status: Home / Self Care | Payer: Medicare Other | Attending: Emergency Medicine | Admitting: Emergency Medicine

## 2018-11-23 ENCOUNTER — Encounter (HOSPITAL_COMMUNITY): Payer: Self-pay | Admitting: Emergency Medicine

## 2018-11-23 DIAGNOSIS — N3000 Acute cystitis without hematuria: Secondary | ICD-10-CM | POA: Diagnosis not present

## 2018-11-23 DIAGNOSIS — I10 Essential (primary) hypertension: Secondary | ICD-10-CM | POA: Diagnosis not present

## 2018-11-23 DIAGNOSIS — Y939 Activity, unspecified: Secondary | ICD-10-CM | POA: Insufficient documentation

## 2018-11-23 DIAGNOSIS — S4992XA Unspecified injury of left shoulder and upper arm, initial encounter: Secondary | ICD-10-CM | POA: Diagnosis not present

## 2018-11-23 DIAGNOSIS — Y929 Unspecified place or not applicable: Secondary | ICD-10-CM | POA: Insufficient documentation

## 2018-11-23 DIAGNOSIS — Y999 Unspecified external cause status: Secondary | ICD-10-CM | POA: Insufficient documentation

## 2018-11-23 DIAGNOSIS — S0990XA Unspecified injury of head, initial encounter: Secondary | ICD-10-CM | POA: Diagnosis not present

## 2018-11-23 DIAGNOSIS — Y908 Blood alcohol level of 240 mg/100 ml or more: Secondary | ICD-10-CM | POA: Insufficient documentation

## 2018-11-23 DIAGNOSIS — Z79899 Other long term (current) drug therapy: Secondary | ICD-10-CM | POA: Diagnosis not present

## 2018-11-23 DIAGNOSIS — F10229 Alcohol dependence with intoxication, unspecified: Secondary | ICD-10-CM | POA: Diagnosis not present

## 2018-11-23 DIAGNOSIS — S0181XA Laceration without foreign body of other part of head, initial encounter: Secondary | ICD-10-CM | POA: Diagnosis not present

## 2018-11-23 DIAGNOSIS — F10929 Alcohol use, unspecified with intoxication, unspecified: Secondary | ICD-10-CM

## 2018-11-23 DIAGNOSIS — M79602 Pain in left arm: Secondary | ICD-10-CM | POA: Diagnosis not present

## 2018-11-23 DIAGNOSIS — W19XXXA Unspecified fall, initial encounter: Secondary | ICD-10-CM | POA: Diagnosis not present

## 2018-11-23 DIAGNOSIS — R51 Headache: Secondary | ICD-10-CM | POA: Insufficient documentation

## 2018-11-23 LAB — CBC WITH DIFFERENTIAL/PLATELET
Abs Immature Granulocytes: 0.03 10*3/uL (ref 0.00–0.07)
Basophils Absolute: 0.1 10*3/uL (ref 0.0–0.1)
Basophils Relative: 1 %
Eosinophils Absolute: 0.4 10*3/uL (ref 0.0–0.5)
Eosinophils Relative: 4 %
HCT: 49.6 % (ref 39.0–52.0)
Hemoglobin: 16.6 g/dL (ref 13.0–17.0)
Immature Granulocytes: 0 %
Lymphocytes Relative: 19 %
Lymphs Abs: 1.6 10*3/uL (ref 0.7–4.0)
MCH: 30.3 pg (ref 26.0–34.0)
MCHC: 33.5 g/dL (ref 30.0–36.0)
MCV: 90.5 fL (ref 80.0–100.0)
Monocytes Absolute: 0.6 10*3/uL (ref 0.1–1.0)
Monocytes Relative: 7 %
Neutro Abs: 5.6 10*3/uL (ref 1.7–7.7)
Neutrophils Relative %: 69 %
Platelets: 178 10*3/uL (ref 150–400)
RBC: 5.48 MIL/uL (ref 4.22–5.81)
RDW: 14.5 % (ref 11.5–15.5)
WBC: 8.2 10*3/uL (ref 4.0–10.5)
nRBC: 0 % (ref 0.0–0.2)

## 2018-11-23 LAB — COMPREHENSIVE METABOLIC PANEL
ALT: 21 U/L (ref 0–44)
AST: 20 U/L (ref 15–41)
Albumin: 3.8 g/dL (ref 3.5–5.0)
Alkaline Phosphatase: 76 U/L (ref 38–126)
Anion gap: 13 (ref 5–15)
BUN: 11 mg/dL (ref 8–23)
CO2: 23 mmol/L (ref 22–32)
Calcium: 8.8 mg/dL — ABNORMAL LOW (ref 8.9–10.3)
Chloride: 107 mmol/L (ref 98–111)
Creatinine, Ser: 1.11 mg/dL (ref 0.61–1.24)
GFR calc Af Amer: >60 mL/min (ref >60)
GFR calc non Af Amer: >60 mL/min (ref >60)
Glucose, Bld: 101 mg/dL — ABNORMAL HIGH (ref 70–99)
Potassium: 3.5 mmol/L (ref 3.5–5.1)
Sodium: 143 mmol/L (ref 135–145)
Total Bilirubin: 0.4 mg/dL (ref 0.3–1.2)
Total Protein: 7.1 g/dL (ref 6.5–8.1)

## 2018-11-23 LAB — MAGNESIUM: Magnesium: 1.9 mg/dL (ref 1.7–2.4)

## 2018-11-23 LAB — ETHANOL: Alcohol, Ethyl (B): 356 mg/dL (ref <10)

## 2018-11-23 MED ORDER — ACETAMINOPHEN 500 MG PO TABS
1000.0000 mg | ORAL_TABLET | Freq: Once | ORAL | Status: AC
  Administered 2018-11-23: 1000 mg via ORAL
  Filled 2018-11-23: qty 2

## 2018-11-23 MED ORDER — VITAMIN B-1 100 MG PO TABS
100.0000 mg | ORAL_TABLET | Freq: Once | ORAL | Status: AC
  Administered 2018-11-23: 23:00:00 100 mg via ORAL
  Filled 2018-11-23: qty 1

## 2018-11-23 MED ORDER — SODIUM CHLORIDE 0.9 % IV BOLUS
500.0000 mL | Freq: Once | INTRAVENOUS | Status: AC
  Administered 2018-11-23: 500 mL via INTRAVENOUS

## 2018-11-23 NOTE — ED Notes (Signed)
Patient transported to CT 

## 2018-11-23 NOTE — Discharge Instructions (Addendum)
Keep wound clean watch for signs of infection. Please seek help for alcohol use when you are ready. Return for new concerns.  You were also found to have a UTI.  Take medications as prescribed.  If you develop fever or back pain you should be reevaluated.
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
## 2018-11-23 NOTE — ED Provider Notes (Addendum)
[facility] EMERGENCY DEPARTMENT Provider Note   CSN: [id number] Arrival date & time: 11/23/18  2021    History   Chief Complaint Chief Complaint  Patient presents with   Fall   Alcohol Intoxication    HPI Ruben [name] is a 68 y.o. male.     Patient with history of alcohol abuse, htn presents after a fall.  Patient admits to chronic alcohol abuse for which he drank vodka today.  Patient does not recall details of the fall.  Patient does report headache and bleeding from his forehead.  Patient denies blood thinning medication.  Tetanus shot up-to-date.     Past Medical History:  Diagnosis Date   Alcoholism ([place])    Aortic atherosclerosis ([place]) 12/26/2016   Noted on CT   Arthritis    BPH (benign prostatic hyperplasia)    Chronic constipation    takes magnesium   Duodenal ulcer    Dyspnea 12/17/2017   ED (erectile dysfunction)    Foley catheter in place    history of no longer in place   GERD (gastroesophageal reflux disease)    Heart palpitations    Occ   Hiatal hernia    History of adenomatous polyp of colon    tubular adenoma's 5726   History of Helicobacter pylori infection    2008   Hypercholesteremia    Hypertension    MR (mitral regurgitation)    Mild, noted on ECHO   Pre-diabetes    Pulmonary nodule 12/26/2016   noted on CT, 5 x 3 mm nodular opacity right upper lobe   Rosacea    Schatzki's ring    last dilated 02/ 2016   Urinary retention    history of   Varicocele 02/25/2014   Small to moderate left varicocele    Patient Active Problem List   Diagnosis Date Noted   S/P Nissen fundoplication (without gastrostomy tube) procedure 04/26/2018    Past Surgical History:  Procedure Laterality Date   BALLOON DILATION N/A 06/08/2014   Procedure: BALLOON DILATION;  Surgeon: Garlan Fair, MD;  Location: [facility];  Service: Endoscopy;  Laterality: N/A;   COLONOSCOPY WITH PROPOFOL N/A 06/08/2014   Procedure: COLONOSCOPY WITH PROPOFOL;  Surgeon: Garlan Fair, MD;  Location: [facility];  Service: Endoscopy;  Laterality: N/A;   ESOPHAGOGASTRODUODENOSCOPY N/A 06/08/2014   Procedure: ESOPHAGOGASTRODUODENOSCOPY (EGD);  Surgeon: Garlan Fair, MD;  Location: [facility];  Service: Endoscopy;  Laterality: N/A;   ESOPHAGOGASTRODUODENOSCOPY (EGD) WITH ESOPHAGEAL DILATION N/A 12/31/2012   Procedure: ESOPHAGOGASTRODUODENOSCOPY (EGD) WITH ESOPHAGEAL DILATION;  Surgeon: Garlan Fair, MD;  Location: [facility];  Service: Endoscopy;  Laterality: N/A;   GREEN LIGHT LASER TURP (TRANSURETHRAL RESECTION OF PROSTATE N/A 12/28/2015   Procedure: GREEN LIGHT LASER TURP (TRANSURETHRAL RESECTION OF PROSTATE;  Surgeon: Festus Aloe, MD;  Location: [facility];  Service: Urology;  Laterality: N/A;        Home Medications    Prior to Admission medications   Medication Sig Start Date End Date Taking? Authorizing Provider  acetaminophen (TYLENOL) 325 MG tablet Take 2 tablets (650 mg total) by mouth every 6 (six) hours. Take scheduled for the next 2-3 days, then as needed for pain 04/28/18   Clovis Riley, MD  chlordiazePOXIDE (LIBRIUM) 25 MG capsule Take 25 mg by mouth 4 (four) times daily as needed for withdrawal or anxiety. 11/13/18   [provider]  disulfiram (ANTABUSE) 250 MG tablet Take 250 mg by mouth 2 (two) times  daily. 11/13/18   [provider]  docusate sodium (COLACE) 100 MG capsule Take 1 capsule (100 mg total) by mouth 2 (two) times daily. Ok to stop taking if having loose bowel movements 04/28/18   Clovis Riley, MD  doxepin (SINEQUAN) 25 MG capsule Take 25 mg by mouth at bedtime. 10/23/18   [provider]  escitalopram (LEXAPRO) 20 MG tablet Take 20 mg by mouth daily. 10/23/18   [provider]  FLUoxetine (PROZAC) 20 MG capsule Take 20 mg by mouth daily. 08/21/18   [provider]  gabapentin (NEURONTIN) 300 MG capsule  Take 300-600 mg by mouth See admin instructions. Take one capsule twice daily and two capsules at bedtime. 10/23/18   [provider]  lisinopril-hydrochlorothiazide (PRINZIDE,ZESTORETIC) 20-12.5 MG per tablet Take 2 tablets by mouth daily.     [provider]  methocarbamol (ROBAXIN) 500 MG tablet Take 1 tablet (500 mg total) by mouth every 6 (six) hours as needed for muscle spasms. 04/28/18   Clovis Riley, MD  naltrexone (DEPADE) 50 MG tablet Take 50 mg by mouth daily. 11/15/18   [provider]  ondansetron (ZOFRAN ODT) 4 MG disintegrating tablet Take 1 tablet (4 mg total) by mouth every 8 (eight) hours as needed for nausea or vomiting. 04/28/18   Clovis Riley, MD  ondansetron (ZOFRAN) 4 MG tablet Take 4 mg by mouth 4 (four) times daily as needed for nausea/vomiting. 07/25/18   [provider]  traZODone (DESYREL) 100 MG tablet Take 100 mg by mouth at bedtime as needed for sleep.    [provider]    Family History History reviewed. No pertinent family history.  Social History Social History   Tobacco Use   Smoking status: Never Smoker   Smokeless tobacco: Never Used  Substance Use Topics   Alcohol use: Yes    Comment: 3 beer or vodka daily   Drug use: No     Allergies   Patient has no known allergies.   Review of Systems Review of Systems  Constitutional: Negative for chills and fever.  HENT: Negative for congestion.   Eyes: Negative for visual disturbance.  Respiratory: Negative for shortness of breath.   Cardiovascular: Negative for chest pain.  Gastrointestinal: Negative for abdominal pain and vomiting.  Genitourinary: Negative for dysuria and flank pain.  Musculoskeletal: Negative for back pain, neck pain and neck stiffness.  Skin: Positive for wound. Negative for rash.  Neurological: Positive for headaches. Negative for light-headedness.     Physical Exam Updated Vital Signs BP 118/72    Pulse 91    Temp 98.6  F (37 C) (Oral)    Resp 14    SpO2 96%   Physical Exam Vitals signs and nursing note reviewed.  Constitutional:      Appearance: He is well-developed.  HENT:     Head: Normocephalic.     Comments: Patient has superficial 2.5 cm laceration mid upper forehead with surrounding superficial abrasion.  No significant gaping. Eyes:     General:        Right eye: No discharge.        Left eye: No discharge.     Conjunctiva/sclera: Conjunctivae normal.  Neck:     Musculoskeletal: Normal range of motion and neck supple.     Trachea: No tracheal deviation.  Cardiovascular:     Rate and Rhythm: Normal rate and regular rhythm.  Pulmonary:     Effort: Pulmonary effort is normal.  Breath sounds: Normal breath sounds.  Abdominal:     General: There is no distension.     Palpations: Abdomen is soft.     Tenderness: There is no abdominal tenderness. There is no guarding.  Musculoskeletal:     Comments: No midline spinal tenderness  Skin:    General: Skin is warm.     Findings: No rash.  Neurological:     General: No focal deficit present.     Mental Status: He is alert.     GCS: GCS eye subscore is 4. GCS verbal subscore is 4. GCS motor subscore is 6.     Cranial Nerves: Cranial nerves are intact.     Comments: Patient follows commands, mild slurring, equal 5+ strength upper and lower extremities bilateral.  Gross sensation intact upper and lower extremities.  Clinically intoxicated.  Cooperative during exam.      ED Treatments / Results  Labs (all labs ordered are listed, but only abnormal results are displayed) Labs Reviewed  COMPREHENSIVE METABOLIC PANEL - Abnormal; Notable for the following components:      Result Value   Glucose, Bld 101 (*)    Calcium 8.8 (*)    All other components within normal limits  ETHANOL - Abnormal; Notable for the following components:   Alcohol, Ethyl (B) 356 (*)    All other components within normal limits  CBC WITH DIFFERENTIAL/PLATELET    MAGNESIUM  URINALYSIS, ROUTINE W REFLEX MICROSCOPIC    EKG None  Radiology Ct Head Wo Contrast  Result Date: 11/23/2018 CLINICAL DATA:  68 year old male with mechanical fall. EXAM: CT HEAD WITHOUT CONTRAST CT MAXILLOFACIAL WITHOUT CONTRAST CT CERVICAL SPINE WITHOUT CONTRAST TECHNIQUE: Multidetector CT imaging of the head, cervical spine, and maxillofacial structures were performed using the standard protocol without intravenous contrast. Multiplanar CT image reconstructions of the cervical spine and maxillofacial structures were also generated. COMPARISON:  None. FINDINGS: CT HEAD FINDINGS Brain: The ventricles and sulci appropriate size patient's age. The gray-white matter discrimination is preserved. There is no acute intracranial hemorrhage. No mass effect or midline shift. No extra-axial fluid collection. Vascular: No hyperdense vessel or unexpected calcification. Skull: Normal. Negative for fracture or focal lesion. Other: There is contusion of the subcutaneous soft tissues of the forehead. No hematoma. CT MAXILLOFACIAL FINDINGS Osseous: No fracture or mandibular dislocation. No destructive process. Orbits: Negative. No traumatic or inflammatory finding. Sinuses: Mild mucoperiosteal thickening of paranasal sinuses. The mastoid air cells are clear. Right frontal sinus retention cyst or polyp. Soft tissues: Negative. CT CERVICAL SPINE FINDINGS Alignment: No acute subluxation. Skull base and vertebrae: No acute fracture. Soft tissues and spinal canal: No prevertebral fluid or swelling. No visible canal hematoma. Disc levels: Mild degenerative changes primarily at C5-C6 and C6-C7. Upper chest: Mild emphysema. Hazy biapical densities may represent atelectatic changes although infiltrate is not excluded. Clinical correlation is recommended. Other: Mild bilateral carotid bulb calcified plaques. IMPRESSION: 1. No acute intracranial pathology. 2. No acute/traumatic cervical spine pathology. 3. No facial bone  fractures. Electronically Signed   By: Anner Crete M.D.   On: 11/23/2018 22:03   Ct Cervical Spine Wo Contrast  Result Date: 11/23/2018 CLINICAL DATA:  68 year old male with mechanical fall. EXAM: CT HEAD WITHOUT CONTRAST CT MAXILLOFACIAL WITHOUT CONTRAST CT CERVICAL SPINE WITHOUT CONTRAST TECHNIQUE: Multidetector CT imaging of the head, cervical spine, and maxillofacial structures were performed using the standard protocol without intravenous contrast. Multiplanar CT image reconstructions of the cervical spine and maxillofacial structures were also generated. COMPARISON:  None.  FINDINGS: CT HEAD FINDINGS Brain: The ventricles and sulci appropriate size patient's age. The gray-white matter discrimination is preserved. There is no acute intracranial hemorrhage. No mass effect or midline shift. No extra-axial fluid collection. Vascular: No hyperdense vessel or unexpected calcification. Skull: Normal. Negative for fracture or focal lesion. Other: There is contusion of the subcutaneous soft tissues of the forehead. No hematoma. CT MAXILLOFACIAL FINDINGS Osseous: No fracture or mandibular dislocation. No destructive process. Orbits: Negative. No traumatic or inflammatory finding. Sinuses: Mild mucoperiosteal thickening of paranasal sinuses. The mastoid air cells are clear. Right frontal sinus retention cyst or polyp. Soft tissues: Negative. CT CERVICAL SPINE FINDINGS Alignment: No acute subluxation. Skull base and vertebrae: No acute fracture. Soft tissues and spinal canal: No prevertebral fluid or swelling. No visible canal hematoma. Disc levels: Mild degenerative changes primarily at C5-C6 and C6-C7. Upper chest: Mild emphysema. Hazy biapical densities may represent atelectatic changes although infiltrate is not excluded. Clinical correlation is recommended. Other: Mild bilateral carotid bulb calcified plaques. IMPRESSION: 1. No acute intracranial pathology. 2. No acute/traumatic cervical spine pathology. 3.  No facial bone fractures. Electronically Signed   By: Anner Crete M.D.   On: 11/23/2018 22:03   Dg Humerus Left  Result Date: 11/23/2018 CLINICAL DATA:  Left arm pain following a fall. EXAM: LEFT HUMERUS - 2+ VIEW COMPARISON:  Left shoulder report dated 10/23/2018. FINDINGS: Mild olecranon spur formation. No fracture or dislocation. IMPRESSION: No fracture or dislocation. Electronically Signed   By: Claudie Revering M.D.   On: 11/23/2018 21:55   Ct Maxillofacial Wo Contrast  Result Date: 11/23/2018 CLINICAL DATA:  68 year old male with mechanical fall. EXAM: CT HEAD WITHOUT CONTRAST CT MAXILLOFACIAL WITHOUT CONTRAST CT CERVICAL SPINE WITHOUT CONTRAST TECHNIQUE: Multidetector CT imaging of the head, cervical spine, and maxillofacial structures were performed using the standard protocol without intravenous contrast. Multiplanar CT image reconstructions of the cervical spine and maxillofacial structures were also generated. COMPARISON:  None. FINDINGS: CT HEAD FINDINGS Brain: The ventricles and sulci appropriate size patient's age. The gray-white matter discrimination is preserved. There is no acute intracranial hemorrhage. No mass effect or midline shift. No extra-axial fluid collection. Vascular: No hyperdense vessel or unexpected calcification. Skull: Normal. Negative for fracture or focal lesion. Other: There is contusion of the subcutaneous soft tissues of the forehead. No hematoma. CT MAXILLOFACIAL FINDINGS Osseous: No fracture or mandibular dislocation. No destructive process. Orbits: Negative. No traumatic or inflammatory finding. Sinuses: Mild mucoperiosteal thickening of paranasal sinuses. The mastoid air cells are clear. Right frontal sinus retention cyst or polyp. Soft tissues: Negative. CT CERVICAL SPINE FINDINGS Alignment: No acute subluxation. Skull base and vertebrae: No acute fracture. Soft tissues and spinal canal: No prevertebral fluid or swelling. No visible canal hematoma. Disc levels:  Mild degenerative changes primarily at C5-C6 and C6-C7. Upper chest: Mild emphysema. Hazy biapical densities may represent atelectatic changes although infiltrate is not excluded. Clinical correlation is recommended. Other: Mild bilateral carotid bulb calcified plaques. IMPRESSION: 1. No acute intracranial pathology. 2. No acute/traumatic cervical spine pathology. 3. No facial bone fractures. Electronically Signed   By: Anner Crete M.D.   On: 11/23/2018 22:03    Procedures .Marland KitchenLaceration Repair  Date/Time: 11/24/2018 12:25 AM Performed by: Elnora Morrison, MD Authorized by: Elnora Morrison, MD   Consent:    Consent obtained:  Verbal   Consent given by:  Patient   Risks discussed:  Infection, pain, poor cosmetic result, need for additional repair, nerve damage and poor wound healing   Alternatives discussed:  No treatment Anesthesia (see MAR for exact dosages):    Anesthesia method:  None Laceration details:    Length (cm):  3   Depth (mm):  3 Repair type:    Repair type:  Simple Pre-procedure details:    Preparation:  Patient was prepped and draped in usual sterile fashion Exploration:    Hemostasis achieved with:  Direct pressure   Wound exploration: wound explored through full range of motion and entire depth of wound probed and visualized   Treatment:    Area cleansed with:  Saline   Amount of cleaning:  Standard   Irrigation solution:  Sterile saline   Irrigation volume:  50   Irrigation method:  Syringe   Visualized foreign bodies/material removed: no   Skin repair:    Repair method:  Tissue adhesive Approximation:    Approximation:  Close Post-procedure details:    Dressing:  Open (no dressing)   (including critical care time)  Medications Ordered in ED Medications  sodium chloride 0.9 % bolus 500 mL (0 mLs Intravenous Stopped 11/23/18 2306)  thiamine (VITAMIN B-1) tablet 100 mg (100 mg Oral Given 11/23/18 2242)  acetaminophen (TYLENOL) tablet 1,000 mg (1,000 mg  Oral Given 11/23/18 2242)     Initial Impression / Assessment and Plan / ED Course  I have reviewed the triage vital signs and the nursing notes.  Pertinent labs & imaging results that were available during my care of the patient were reviewed by me and considered in my medical decision making (see chart for details).       Patient with alcohol abuse history presents clinically intoxicated with head injury.  Patient does not recall details of the event.  Primary injury to the forehead, CT scan performed no acute intracranial bleeding, cervical and facial CTs performed as well and no fractures.  Patient did improve on reassessment however still clinically intoxicated.  Patient has no ride home at this time.  Patient will be observed closely until full ride home and he can ambulate.  Oral fluids given.  Patient denies other pain at this time. Blood work reviewed ethanol elevated 356, normal glucose. Dermabond for small laceration.  Wound care by nursing.   Family called by nursing.   Signed out to reassess and observe until safely can be discharged to family. Patient asking to call family member.   Final Clinical Impressions(s) / ED Diagnoses   Final diagnoses:  Alcoholic intoxication with complication ([place])  Acute head injury, initial encounter  Facial laceration, initial encounter    ED Discharge Orders    None       Elnora Morrison, MD 11/24/18 [id number]    Elnora Morrison, MD 11/24/18 [phone]

## 2018-11-24 LAB — URINALYSIS, ROUTINE W REFLEX MICROSCOPIC
Bilirubin Urine: NEGATIVE
Glucose, UA: NEGATIVE mg/dL
Ketones, ur: NEGATIVE mg/dL
Nitrite: NEGATIVE
Protein, ur: 30 mg/dL — AB
Specific Gravity, Urine: 1.014 (ref 1.005–1.030)
WBC, UA: >50 WBC/hpf — ABNORMAL HIGH (ref 0–5)
pH: 5 (ref 5.0–8.0)

## 2018-11-24 MED ORDER — CEPHALEXIN 500 MG PO CAPS: 500.0000 mg | ORAL_CAPSULE | Freq: Three times a day (TID) | ORAL | 0 refills | Status: DC

## 2018-11-24 NOTE — ED Provider Notes (Signed)
Patient watched overnight.  On evaluation this morning he is ambulatory independently.  I have reviewed his lab work.  Does appear that he likely has UTI with greater than 50 white cells and many bacteria.  Will send a culture and provide Keflex.  He is waiting on his sister to pick up.  He is ambulatory independently.  After history, exam, and medical workup I feel the patient has been appropriately medically screened and is safe for discharge home. Pertinent diagnoses were discussed with the patient. Patient was given return precautions.    Merryl Hacker, MD 11/24/18 [phone]

## 2018-11-24 NOTE — ED Notes (Signed)
Patient verbalizes understanding of discharge instructions. Opportunity for questioning and answers were provided. Armband removed by staff, pt discharged from ED ambulatory w/ sister

## 2018-11-24 NOTE — ED Notes (Signed)
Family states called and stated they should be here around 11am

## 2018-11-24 NOTE — ED Notes (Signed)
Breakfast tray given. °

## 2018-11-24 NOTE — ED Notes (Signed)
Patient is waiting for his sister to come from [place] to pick him up.

## 2018-11-24 NOTE — ED Notes (Signed)
Patient family member called aware we are still waiting on them to come and pick him up,

## 2018-12-13 DIAGNOSIS — Z23 Encounter for immunization: Secondary | ICD-10-CM | POA: Diagnosis not present

## 2018-12-25 DIAGNOSIS — M25612 Stiffness of left shoulder, not elsewhere classified: Secondary | ICD-10-CM | POA: Diagnosis not present

## 2018-12-25 DIAGNOSIS — M25512 Pain in left shoulder: Secondary | ICD-10-CM | POA: Diagnosis not present

## 2018-12-25 DIAGNOSIS — M6281 Muscle weakness (generalized): Secondary | ICD-10-CM | POA: Diagnosis not present

## 2018-12-25 DIAGNOSIS — M2560 Stiffness of unspecified joint, not elsewhere classified: Secondary | ICD-10-CM | POA: Diagnosis not present

## 2018-12-25 DIAGNOSIS — M542 Cervicalgia: Secondary | ICD-10-CM | POA: Diagnosis not present

## 2018-12-25 DIAGNOSIS — R293 Abnormal posture: Secondary | ICD-10-CM | POA: Diagnosis not present

## 2018-12-25 DIAGNOSIS — M25611 Stiffness of right shoulder, not elsewhere classified: Secondary | ICD-10-CM | POA: Diagnosis not present

## 2018-12-25 DIAGNOSIS — I10 Essential (primary) hypertension: Secondary | ICD-10-CM | POA: Diagnosis not present

## 2018-12-25 DIAGNOSIS — M799 Soft tissue disorder, unspecified: Secondary | ICD-10-CM | POA: Diagnosis not present

## 2018-12-25 DIAGNOSIS — M25511 Pain in right shoulder: Secondary | ICD-10-CM | POA: Diagnosis not present

## 2018-12-26 DIAGNOSIS — M799 Soft tissue disorder, unspecified: Secondary | ICD-10-CM | POA: Diagnosis not present

## 2018-12-26 DIAGNOSIS — M542 Cervicalgia: Secondary | ICD-10-CM | POA: Diagnosis not present

## 2018-12-26 DIAGNOSIS — I10 Essential (primary) hypertension: Secondary | ICD-10-CM | POA: Diagnosis not present

## 2018-12-26 DIAGNOSIS — M25612 Stiffness of left shoulder, not elsewhere classified: Secondary | ICD-10-CM | POA: Diagnosis not present

## 2018-12-26 DIAGNOSIS — M25512 Pain in left shoulder: Secondary | ICD-10-CM | POA: Diagnosis not present

## 2018-12-26 DIAGNOSIS — M25511 Pain in right shoulder: Secondary | ICD-10-CM | POA: Diagnosis not present

## 2018-12-26 DIAGNOSIS — M25611 Stiffness of right shoulder, not elsewhere classified: Secondary | ICD-10-CM | POA: Diagnosis not present

## 2018-12-26 DIAGNOSIS — M6281 Muscle weakness (generalized): Secondary | ICD-10-CM | POA: Diagnosis not present

## 2018-12-26 DIAGNOSIS — R293 Abnormal posture: Secondary | ICD-10-CM | POA: Diagnosis not present

## 2018-12-26 DIAGNOSIS — M2560 Stiffness of unspecified joint, not elsewhere classified: Secondary | ICD-10-CM | POA: Diagnosis not present

## 2019-01-02 DIAGNOSIS — M6281 Muscle weakness (generalized): Secondary | ICD-10-CM | POA: Diagnosis not present

## 2019-01-02 DIAGNOSIS — R293 Abnormal posture: Secondary | ICD-10-CM | POA: Diagnosis not present

## 2019-01-02 DIAGNOSIS — M25611 Stiffness of right shoulder, not elsewhere classified: Secondary | ICD-10-CM | POA: Diagnosis not present

## 2019-01-02 DIAGNOSIS — M25511 Pain in right shoulder: Secondary | ICD-10-CM | POA: Diagnosis not present

## 2019-01-02 DIAGNOSIS — I10 Essential (primary) hypertension: Secondary | ICD-10-CM | POA: Diagnosis not present

## 2019-01-02 DIAGNOSIS — M2560 Stiffness of unspecified joint, not elsewhere classified: Secondary | ICD-10-CM | POA: Diagnosis not present

## 2019-01-02 DIAGNOSIS — M25612 Stiffness of left shoulder, not elsewhere classified: Secondary | ICD-10-CM | POA: Diagnosis not present

## 2019-01-02 DIAGNOSIS — M542 Cervicalgia: Secondary | ICD-10-CM | POA: Diagnosis not present

## 2019-01-02 DIAGNOSIS — M25512 Pain in left shoulder: Secondary | ICD-10-CM | POA: Diagnosis not present

## 2019-01-02 DIAGNOSIS — M799 Soft tissue disorder, unspecified: Secondary | ICD-10-CM | POA: Diagnosis not present

## 2019-01-06 DIAGNOSIS — M25611 Stiffness of right shoulder, not elsewhere classified: Secondary | ICD-10-CM | POA: Diagnosis not present

## 2019-01-06 DIAGNOSIS — M799 Soft tissue disorder, unspecified: Secondary | ICD-10-CM | POA: Diagnosis not present

## 2019-01-06 DIAGNOSIS — I10 Essential (primary) hypertension: Secondary | ICD-10-CM | POA: Diagnosis not present

## 2019-01-06 DIAGNOSIS — R293 Abnormal posture: Secondary | ICD-10-CM | POA: Diagnosis not present

## 2019-01-06 DIAGNOSIS — M25511 Pain in right shoulder: Secondary | ICD-10-CM | POA: Diagnosis not present

## 2019-01-06 DIAGNOSIS — M6281 Muscle weakness (generalized): Secondary | ICD-10-CM | POA: Diagnosis not present

## 2019-01-06 DIAGNOSIS — M25512 Pain in left shoulder: Secondary | ICD-10-CM | POA: Diagnosis not present

## 2019-01-06 DIAGNOSIS — M25612 Stiffness of left shoulder, not elsewhere classified: Secondary | ICD-10-CM | POA: Diagnosis not present

## 2019-01-06 DIAGNOSIS — M542 Cervicalgia: Secondary | ICD-10-CM | POA: Diagnosis not present

## 2019-01-06 DIAGNOSIS — M2560 Stiffness of unspecified joint, not elsewhere classified: Secondary | ICD-10-CM | POA: Diagnosis not present

## 2019-01-08 DIAGNOSIS — M2560 Stiffness of unspecified joint, not elsewhere classified: Secondary | ICD-10-CM | POA: Diagnosis not present

## 2019-01-08 DIAGNOSIS — M25611 Stiffness of right shoulder, not elsewhere classified: Secondary | ICD-10-CM | POA: Diagnosis not present

## 2019-01-08 DIAGNOSIS — I10 Essential (primary) hypertension: Secondary | ICD-10-CM | POA: Diagnosis not present

## 2019-01-08 DIAGNOSIS — M799 Soft tissue disorder, unspecified: Secondary | ICD-10-CM | POA: Diagnosis not present

## 2019-01-08 DIAGNOSIS — M25511 Pain in right shoulder: Secondary | ICD-10-CM | POA: Diagnosis not present

## 2019-01-08 DIAGNOSIS — M6281 Muscle weakness (generalized): Secondary | ICD-10-CM | POA: Diagnosis not present

## 2019-01-08 DIAGNOSIS — M25612 Stiffness of left shoulder, not elsewhere classified: Secondary | ICD-10-CM | POA: Diagnosis not present

## 2019-01-08 DIAGNOSIS — R293 Abnormal posture: Secondary | ICD-10-CM | POA: Diagnosis not present

## 2019-01-08 DIAGNOSIS — M542 Cervicalgia: Secondary | ICD-10-CM | POA: Diagnosis not present

## 2019-01-08 DIAGNOSIS — M25512 Pain in left shoulder: Secondary | ICD-10-CM | POA: Diagnosis not present

## 2019-01-21 DIAGNOSIS — R7989 Other specified abnormal findings of blood chemistry: Secondary | ICD-10-CM | POA: Diagnosis not present

## 2019-01-23 DIAGNOSIS — M542 Cervicalgia: Secondary | ICD-10-CM | POA: Diagnosis not present

## 2019-01-23 DIAGNOSIS — R293 Abnormal posture: Secondary | ICD-10-CM | POA: Diagnosis not present

## 2019-01-23 DIAGNOSIS — M25512 Pain in left shoulder: Secondary | ICD-10-CM | POA: Diagnosis not present

## 2019-01-23 DIAGNOSIS — M25511 Pain in right shoulder: Secondary | ICD-10-CM | POA: Diagnosis not present

## 2019-01-23 DIAGNOSIS — M25612 Stiffness of left shoulder, not elsewhere classified: Secondary | ICD-10-CM | POA: Diagnosis not present

## 2019-01-23 DIAGNOSIS — M799 Soft tissue disorder, unspecified: Secondary | ICD-10-CM | POA: Diagnosis not present

## 2019-01-23 DIAGNOSIS — I10 Essential (primary) hypertension: Secondary | ICD-10-CM | POA: Diagnosis not present

## 2019-01-23 DIAGNOSIS — M2560 Stiffness of unspecified joint, not elsewhere classified: Secondary | ICD-10-CM | POA: Diagnosis not present

## 2019-01-23 DIAGNOSIS — M6281 Muscle weakness (generalized): Secondary | ICD-10-CM | POA: Diagnosis not present

## 2019-01-23 DIAGNOSIS — M25611 Stiffness of right shoulder, not elsewhere classified: Secondary | ICD-10-CM | POA: Diagnosis not present

## 2019-01-24 DIAGNOSIS — M2560 Stiffness of unspecified joint, not elsewhere classified: Secondary | ICD-10-CM | POA: Diagnosis not present

## 2019-01-24 DIAGNOSIS — M6281 Muscle weakness (generalized): Secondary | ICD-10-CM | POA: Diagnosis not present

## 2019-01-24 DIAGNOSIS — I10 Essential (primary) hypertension: Secondary | ICD-10-CM | POA: Diagnosis not present

## 2019-01-24 DIAGNOSIS — M25512 Pain in left shoulder: Secondary | ICD-10-CM | POA: Diagnosis not present

## 2019-01-24 DIAGNOSIS — M542 Cervicalgia: Secondary | ICD-10-CM | POA: Diagnosis not present

## 2019-01-24 DIAGNOSIS — M25511 Pain in right shoulder: Secondary | ICD-10-CM | POA: Diagnosis not present

## 2019-01-24 DIAGNOSIS — M799 Soft tissue disorder, unspecified: Secondary | ICD-10-CM | POA: Diagnosis not present

## 2019-01-24 DIAGNOSIS — M25611 Stiffness of right shoulder, not elsewhere classified: Secondary | ICD-10-CM | POA: Diagnosis not present

## 2019-01-24 DIAGNOSIS — R293 Abnormal posture: Secondary | ICD-10-CM | POA: Diagnosis not present

## 2019-01-24 DIAGNOSIS — M25612 Stiffness of left shoulder, not elsewhere classified: Secondary | ICD-10-CM | POA: Diagnosis not present

## 2019-01-27 DIAGNOSIS — M799 Soft tissue disorder, unspecified: Secondary | ICD-10-CM | POA: Diagnosis not present

## 2019-01-27 DIAGNOSIS — M6281 Muscle weakness (generalized): Secondary | ICD-10-CM | POA: Diagnosis not present

## 2019-01-27 DIAGNOSIS — I10 Essential (primary) hypertension: Secondary | ICD-10-CM | POA: Diagnosis not present

## 2019-01-27 DIAGNOSIS — M542 Cervicalgia: Secondary | ICD-10-CM | POA: Diagnosis not present

## 2019-01-27 DIAGNOSIS — M25511 Pain in right shoulder: Secondary | ICD-10-CM | POA: Diagnosis not present

## 2019-01-27 DIAGNOSIS — M2560 Stiffness of unspecified joint, not elsewhere classified: Secondary | ICD-10-CM | POA: Diagnosis not present

## 2019-01-27 DIAGNOSIS — M25611 Stiffness of right shoulder, not elsewhere classified: Secondary | ICD-10-CM | POA: Diagnosis not present

## 2019-01-27 DIAGNOSIS — M25512 Pain in left shoulder: Secondary | ICD-10-CM | POA: Diagnosis not present

## 2019-01-27 DIAGNOSIS — M25612 Stiffness of left shoulder, not elsewhere classified: Secondary | ICD-10-CM | POA: Diagnosis not present

## 2019-01-27 DIAGNOSIS — R293 Abnormal posture: Secondary | ICD-10-CM | POA: Diagnosis not present

## 2019-01-29 DIAGNOSIS — M2560 Stiffness of unspecified joint, not elsewhere classified: Secondary | ICD-10-CM | POA: Diagnosis not present

## 2019-01-29 DIAGNOSIS — M25612 Stiffness of left shoulder, not elsewhere classified: Secondary | ICD-10-CM | POA: Diagnosis not present

## 2019-01-29 DIAGNOSIS — R293 Abnormal posture: Secondary | ICD-10-CM | POA: Diagnosis not present

## 2019-01-29 DIAGNOSIS — M6281 Muscle weakness (generalized): Secondary | ICD-10-CM | POA: Diagnosis not present

## 2019-01-29 DIAGNOSIS — M25611 Stiffness of right shoulder, not elsewhere classified: Secondary | ICD-10-CM | POA: Diagnosis not present

## 2019-01-29 DIAGNOSIS — M542 Cervicalgia: Secondary | ICD-10-CM | POA: Diagnosis not present

## 2019-01-29 DIAGNOSIS — M799 Soft tissue disorder, unspecified: Secondary | ICD-10-CM | POA: Diagnosis not present

## 2019-01-29 DIAGNOSIS — M25512 Pain in left shoulder: Secondary | ICD-10-CM | POA: Diagnosis not present

## 2019-01-29 DIAGNOSIS — M25511 Pain in right shoulder: Secondary | ICD-10-CM | POA: Diagnosis not present

## 2019-01-29 DIAGNOSIS — I10 Essential (primary) hypertension: Secondary | ICD-10-CM | POA: Diagnosis not present

## 2019-01-31 DIAGNOSIS — M25611 Stiffness of right shoulder, not elsewhere classified: Secondary | ICD-10-CM | POA: Diagnosis not present

## 2019-01-31 DIAGNOSIS — R293 Abnormal posture: Secondary | ICD-10-CM | POA: Diagnosis not present

## 2019-01-31 DIAGNOSIS — M799 Soft tissue disorder, unspecified: Secondary | ICD-10-CM | POA: Diagnosis not present

## 2019-01-31 DIAGNOSIS — M542 Cervicalgia: Secondary | ICD-10-CM | POA: Diagnosis not present

## 2019-01-31 DIAGNOSIS — M2560 Stiffness of unspecified joint, not elsewhere classified: Secondary | ICD-10-CM | POA: Diagnosis not present

## 2019-01-31 DIAGNOSIS — I10 Essential (primary) hypertension: Secondary | ICD-10-CM | POA: Diagnosis not present

## 2019-01-31 DIAGNOSIS — M6281 Muscle weakness (generalized): Secondary | ICD-10-CM | POA: Diagnosis not present

## 2019-01-31 DIAGNOSIS — M25512 Pain in left shoulder: Secondary | ICD-10-CM | POA: Diagnosis not present

## 2019-01-31 DIAGNOSIS — M25511 Pain in right shoulder: Secondary | ICD-10-CM | POA: Diagnosis not present

## 2019-01-31 DIAGNOSIS — M25612 Stiffness of left shoulder, not elsewhere classified: Secondary | ICD-10-CM | POA: Diagnosis not present

## 2019-02-03 DIAGNOSIS — M542 Cervicalgia: Secondary | ICD-10-CM | POA: Diagnosis not present

## 2019-02-03 DIAGNOSIS — M25611 Stiffness of right shoulder, not elsewhere classified: Secondary | ICD-10-CM | POA: Diagnosis not present

## 2019-02-03 DIAGNOSIS — M25512 Pain in left shoulder: Secondary | ICD-10-CM | POA: Diagnosis not present

## 2019-02-03 DIAGNOSIS — I10 Essential (primary) hypertension: Secondary | ICD-10-CM | POA: Diagnosis not present

## 2019-02-03 DIAGNOSIS — M2560 Stiffness of unspecified joint, not elsewhere classified: Secondary | ICD-10-CM | POA: Diagnosis not present

## 2019-02-03 DIAGNOSIS — M6281 Muscle weakness (generalized): Secondary | ICD-10-CM | POA: Diagnosis not present

## 2019-02-03 DIAGNOSIS — R293 Abnormal posture: Secondary | ICD-10-CM | POA: Diagnosis not present

## 2019-02-03 DIAGNOSIS — M799 Soft tissue disorder, unspecified: Secondary | ICD-10-CM | POA: Diagnosis not present

## 2019-02-03 DIAGNOSIS — M25511 Pain in right shoulder: Secondary | ICD-10-CM | POA: Diagnosis not present

## 2019-02-03 DIAGNOSIS — M25612 Stiffness of left shoulder, not elsewhere classified: Secondary | ICD-10-CM | POA: Diagnosis not present

## 2019-02-07 DIAGNOSIS — M25512 Pain in left shoulder: Secondary | ICD-10-CM | POA: Diagnosis not present

## 2019-02-07 DIAGNOSIS — M25612 Stiffness of left shoulder, not elsewhere classified: Secondary | ICD-10-CM | POA: Diagnosis not present

## 2019-02-07 DIAGNOSIS — M25611 Stiffness of right shoulder, not elsewhere classified: Secondary | ICD-10-CM | POA: Diagnosis not present

## 2019-02-07 DIAGNOSIS — M6281 Muscle weakness (generalized): Secondary | ICD-10-CM | POA: Diagnosis not present

## 2019-02-07 DIAGNOSIS — M542 Cervicalgia: Secondary | ICD-10-CM | POA: Diagnosis not present

## 2019-02-07 DIAGNOSIS — M25511 Pain in right shoulder: Secondary | ICD-10-CM | POA: Diagnosis not present

## 2019-02-07 DIAGNOSIS — I10 Essential (primary) hypertension: Secondary | ICD-10-CM | POA: Diagnosis not present

## 2019-02-07 DIAGNOSIS — R293 Abnormal posture: Secondary | ICD-10-CM | POA: Diagnosis not present

## 2019-02-07 DIAGNOSIS — M799 Soft tissue disorder, unspecified: Secondary | ICD-10-CM | POA: Diagnosis not present

## 2019-02-07 DIAGNOSIS — M2560 Stiffness of unspecified joint, not elsewhere classified: Secondary | ICD-10-CM | POA: Diagnosis not present

## 2019-02-10 DIAGNOSIS — M25512 Pain in left shoulder: Secondary | ICD-10-CM | POA: Diagnosis not present

## 2019-02-10 DIAGNOSIS — M25611 Stiffness of right shoulder, not elsewhere classified: Secondary | ICD-10-CM | POA: Diagnosis not present

## 2019-02-10 DIAGNOSIS — M6281 Muscle weakness (generalized): Secondary | ICD-10-CM | POA: Diagnosis not present

## 2019-02-10 DIAGNOSIS — M542 Cervicalgia: Secondary | ICD-10-CM | POA: Diagnosis not present

## 2019-02-10 DIAGNOSIS — M25612 Stiffness of left shoulder, not elsewhere classified: Secondary | ICD-10-CM | POA: Diagnosis not present

## 2019-02-10 DIAGNOSIS — I10 Essential (primary) hypertension: Secondary | ICD-10-CM | POA: Diagnosis not present

## 2019-02-10 DIAGNOSIS — M25511 Pain in right shoulder: Secondary | ICD-10-CM | POA: Diagnosis not present

## 2019-02-10 DIAGNOSIS — M2560 Stiffness of unspecified joint, not elsewhere classified: Secondary | ICD-10-CM | POA: Diagnosis not present

## 2019-02-10 DIAGNOSIS — M799 Soft tissue disorder, unspecified: Secondary | ICD-10-CM | POA: Diagnosis not present

## 2019-02-10 DIAGNOSIS — R293 Abnormal posture: Secondary | ICD-10-CM | POA: Diagnosis not present

## 2019-02-12 DIAGNOSIS — M799 Soft tissue disorder, unspecified: Secondary | ICD-10-CM | POA: Diagnosis not present

## 2019-02-12 DIAGNOSIS — M542 Cervicalgia: Secondary | ICD-10-CM | POA: Diagnosis not present

## 2019-02-12 DIAGNOSIS — M25611 Stiffness of right shoulder, not elsewhere classified: Secondary | ICD-10-CM | POA: Diagnosis not present

## 2019-02-12 DIAGNOSIS — M25512 Pain in left shoulder: Secondary | ICD-10-CM | POA: Diagnosis not present

## 2019-02-12 DIAGNOSIS — I10 Essential (primary) hypertension: Secondary | ICD-10-CM | POA: Diagnosis not present

## 2019-02-12 DIAGNOSIS — M25612 Stiffness of left shoulder, not elsewhere classified: Secondary | ICD-10-CM | POA: Diagnosis not present

## 2019-02-12 DIAGNOSIS — R293 Abnormal posture: Secondary | ICD-10-CM | POA: Diagnosis not present

## 2019-02-12 DIAGNOSIS — M6281 Muscle weakness (generalized): Secondary | ICD-10-CM | POA: Diagnosis not present

## 2019-02-12 DIAGNOSIS — M25511 Pain in right shoulder: Secondary | ICD-10-CM | POA: Diagnosis not present

## 2019-02-12 DIAGNOSIS — M2560 Stiffness of unspecified joint, not elsewhere classified: Secondary | ICD-10-CM | POA: Diagnosis not present

## 2019-02-19 DIAGNOSIS — M542 Cervicalgia: Secondary | ICD-10-CM | POA: Diagnosis not present

## 2019-02-19 DIAGNOSIS — I10 Essential (primary) hypertension: Secondary | ICD-10-CM | POA: Diagnosis not present

## 2019-02-19 DIAGNOSIS — R293 Abnormal posture: Secondary | ICD-10-CM | POA: Diagnosis not present

## 2019-02-19 DIAGNOSIS — M25511 Pain in right shoulder: Secondary | ICD-10-CM | POA: Diagnosis not present

## 2019-02-19 DIAGNOSIS — M25512 Pain in left shoulder: Secondary | ICD-10-CM | POA: Diagnosis not present

## 2019-02-19 DIAGNOSIS — M25612 Stiffness of left shoulder, not elsewhere classified: Secondary | ICD-10-CM | POA: Diagnosis not present

## 2019-02-19 DIAGNOSIS — M799 Soft tissue disorder, unspecified: Secondary | ICD-10-CM | POA: Diagnosis not present

## 2019-02-19 DIAGNOSIS — M25611 Stiffness of right shoulder, not elsewhere classified: Secondary | ICD-10-CM | POA: Diagnosis not present

## 2019-02-19 DIAGNOSIS — M6281 Muscle weakness (generalized): Secondary | ICD-10-CM | POA: Diagnosis not present

## 2019-02-19 DIAGNOSIS — M2560 Stiffness of unspecified joint, not elsewhere classified: Secondary | ICD-10-CM | POA: Diagnosis not present

## 2019-02-20 DIAGNOSIS — N401 Enlarged prostate with lower urinary tract symptoms: Secondary | ICD-10-CM | POA: Diagnosis not present

## 2019-02-20 DIAGNOSIS — R3915 Urgency of urination: Secondary | ICD-10-CM | POA: Diagnosis not present

## 2019-02-21 DIAGNOSIS — M25512 Pain in left shoulder: Secondary | ICD-10-CM | POA: Diagnosis not present

## 2019-02-21 DIAGNOSIS — M6281 Muscle weakness (generalized): Secondary | ICD-10-CM | POA: Diagnosis not present

## 2019-02-21 DIAGNOSIS — M25612 Stiffness of left shoulder, not elsewhere classified: Secondary | ICD-10-CM | POA: Diagnosis not present

## 2019-02-21 DIAGNOSIS — M25611 Stiffness of right shoulder, not elsewhere classified: Secondary | ICD-10-CM | POA: Diagnosis not present

## 2019-02-21 DIAGNOSIS — R293 Abnormal posture: Secondary | ICD-10-CM | POA: Diagnosis not present

## 2019-02-21 DIAGNOSIS — M542 Cervicalgia: Secondary | ICD-10-CM | POA: Diagnosis not present

## 2019-02-21 DIAGNOSIS — M2560 Stiffness of unspecified joint, not elsewhere classified: Secondary | ICD-10-CM | POA: Diagnosis not present

## 2019-02-21 DIAGNOSIS — M25511 Pain in right shoulder: Secondary | ICD-10-CM | POA: Diagnosis not present

## 2019-02-21 DIAGNOSIS — I10 Essential (primary) hypertension: Secondary | ICD-10-CM | POA: Diagnosis not present

## 2019-02-21 DIAGNOSIS — M799 Soft tissue disorder, unspecified: Secondary | ICD-10-CM | POA: Diagnosis not present

## 2019-02-24 DIAGNOSIS — M25512 Pain in left shoulder: Secondary | ICD-10-CM | POA: Diagnosis not present

## 2019-02-24 DIAGNOSIS — R293 Abnormal posture: Secondary | ICD-10-CM | POA: Diagnosis not present

## 2019-02-24 DIAGNOSIS — M25611 Stiffness of right shoulder, not elsewhere classified: Secondary | ICD-10-CM | POA: Diagnosis not present

## 2019-02-24 DIAGNOSIS — M2560 Stiffness of unspecified joint, not elsewhere classified: Secondary | ICD-10-CM | POA: Diagnosis not present

## 2019-02-24 DIAGNOSIS — M25612 Stiffness of left shoulder, not elsewhere classified: Secondary | ICD-10-CM | POA: Diagnosis not present

## 2019-02-24 DIAGNOSIS — M799 Soft tissue disorder, unspecified: Secondary | ICD-10-CM | POA: Diagnosis not present

## 2019-02-24 DIAGNOSIS — M25511 Pain in right shoulder: Secondary | ICD-10-CM | POA: Diagnosis not present

## 2019-02-24 DIAGNOSIS — M6281 Muscle weakness (generalized): Secondary | ICD-10-CM | POA: Diagnosis not present

## 2019-02-24 DIAGNOSIS — I10 Essential (primary) hypertension: Secondary | ICD-10-CM | POA: Diagnosis not present

## 2019-02-24 DIAGNOSIS — M542 Cervicalgia: Secondary | ICD-10-CM | POA: Diagnosis not present

## 2019-02-26 DIAGNOSIS — M2560 Stiffness of unspecified joint, not elsewhere classified: Secondary | ICD-10-CM | POA: Diagnosis not present

## 2019-02-26 DIAGNOSIS — I10 Essential (primary) hypertension: Secondary | ICD-10-CM | POA: Diagnosis not present

## 2019-02-26 DIAGNOSIS — M25511 Pain in right shoulder: Secondary | ICD-10-CM | POA: Diagnosis not present

## 2019-02-26 DIAGNOSIS — M542 Cervicalgia: Secondary | ICD-10-CM | POA: Diagnosis not present

## 2019-02-26 DIAGNOSIS — M25611 Stiffness of right shoulder, not elsewhere classified: Secondary | ICD-10-CM | POA: Diagnosis not present

## 2019-02-26 DIAGNOSIS — R293 Abnormal posture: Secondary | ICD-10-CM | POA: Diagnosis not present

## 2019-02-26 DIAGNOSIS — M25512 Pain in left shoulder: Secondary | ICD-10-CM | POA: Diagnosis not present

## 2019-02-26 DIAGNOSIS — M25612 Stiffness of left shoulder, not elsewhere classified: Secondary | ICD-10-CM | POA: Diagnosis not present

## 2019-02-26 DIAGNOSIS — M6281 Muscle weakness (generalized): Secondary | ICD-10-CM | POA: Diagnosis not present

## 2019-02-26 DIAGNOSIS — M799 Soft tissue disorder, unspecified: Secondary | ICD-10-CM | POA: Diagnosis not present

## 2019-03-05 DIAGNOSIS — M2560 Stiffness of unspecified joint, not elsewhere classified: Secondary | ICD-10-CM | POA: Diagnosis not present

## 2019-03-05 DIAGNOSIS — M542 Cervicalgia: Secondary | ICD-10-CM | POA: Diagnosis not present

## 2019-03-05 DIAGNOSIS — R293 Abnormal posture: Secondary | ICD-10-CM | POA: Diagnosis not present

## 2019-03-05 DIAGNOSIS — M25511 Pain in right shoulder: Secondary | ICD-10-CM | POA: Diagnosis not present

## 2019-03-05 DIAGNOSIS — I10 Essential (primary) hypertension: Secondary | ICD-10-CM | POA: Diagnosis not present

## 2019-03-05 DIAGNOSIS — M25512 Pain in left shoulder: Secondary | ICD-10-CM | POA: Diagnosis not present

## 2019-03-05 DIAGNOSIS — M25611 Stiffness of right shoulder, not elsewhere classified: Secondary | ICD-10-CM | POA: Diagnosis not present

## 2019-03-05 DIAGNOSIS — M799 Soft tissue disorder, unspecified: Secondary | ICD-10-CM | POA: Diagnosis not present

## 2019-03-05 DIAGNOSIS — M6281 Muscle weakness (generalized): Secondary | ICD-10-CM | POA: Diagnosis not present

## 2019-03-05 DIAGNOSIS — M25612 Stiffness of left shoulder, not elsewhere classified: Secondary | ICD-10-CM | POA: Diagnosis not present

## 2019-03-10 DIAGNOSIS — M799 Soft tissue disorder, unspecified: Secondary | ICD-10-CM | POA: Diagnosis not present

## 2019-03-10 DIAGNOSIS — M2560 Stiffness of unspecified joint, not elsewhere classified: Secondary | ICD-10-CM | POA: Diagnosis not present

## 2019-03-10 DIAGNOSIS — M25511 Pain in right shoulder: Secondary | ICD-10-CM | POA: Diagnosis not present

## 2019-03-10 DIAGNOSIS — I10 Essential (primary) hypertension: Secondary | ICD-10-CM | POA: Diagnosis not present

## 2019-03-10 DIAGNOSIS — R293 Abnormal posture: Secondary | ICD-10-CM | POA: Diagnosis not present

## 2019-03-10 DIAGNOSIS — M25611 Stiffness of right shoulder, not elsewhere classified: Secondary | ICD-10-CM | POA: Diagnosis not present

## 2019-03-10 DIAGNOSIS — M6281 Muscle weakness (generalized): Secondary | ICD-10-CM | POA: Diagnosis not present

## 2019-03-10 DIAGNOSIS — M25612 Stiffness of left shoulder, not elsewhere classified: Secondary | ICD-10-CM | POA: Diagnosis not present

## 2019-03-10 DIAGNOSIS — M542 Cervicalgia: Secondary | ICD-10-CM | POA: Diagnosis not present

## 2019-03-10 DIAGNOSIS — M25512 Pain in left shoulder: Secondary | ICD-10-CM | POA: Diagnosis not present

## 2019-03-11 DIAGNOSIS — L089 Local infection of the skin and subcutaneous tissue, unspecified: Secondary | ICD-10-CM | POA: Diagnosis not present

## 2019-03-11 DIAGNOSIS — R0602 Shortness of breath: Secondary | ICD-10-CM | POA: Diagnosis not present

## 2019-03-14 DIAGNOSIS — M25511 Pain in right shoulder: Secondary | ICD-10-CM | POA: Diagnosis not present

## 2019-03-14 DIAGNOSIS — M25612 Stiffness of left shoulder, not elsewhere classified: Secondary | ICD-10-CM | POA: Diagnosis not present

## 2019-03-14 DIAGNOSIS — M542 Cervicalgia: Secondary | ICD-10-CM | POA: Diagnosis not present

## 2019-03-14 DIAGNOSIS — I10 Essential (primary) hypertension: Secondary | ICD-10-CM | POA: Diagnosis not present

## 2019-03-14 DIAGNOSIS — M2560 Stiffness of unspecified joint, not elsewhere classified: Secondary | ICD-10-CM | POA: Diagnosis not present

## 2019-03-14 DIAGNOSIS — M799 Soft tissue disorder, unspecified: Secondary | ICD-10-CM | POA: Diagnosis not present

## 2019-03-14 DIAGNOSIS — M6281 Muscle weakness (generalized): Secondary | ICD-10-CM | POA: Diagnosis not present

## 2019-03-14 DIAGNOSIS — R293 Abnormal posture: Secondary | ICD-10-CM | POA: Diagnosis not present

## 2019-03-14 DIAGNOSIS — M25512 Pain in left shoulder: Secondary | ICD-10-CM | POA: Diagnosis not present

## 2019-03-14 DIAGNOSIS — M25611 Stiffness of right shoulder, not elsewhere classified: Secondary | ICD-10-CM | POA: Diagnosis not present

## 2019-03-17 DIAGNOSIS — M6281 Muscle weakness (generalized): Secondary | ICD-10-CM | POA: Diagnosis not present

## 2019-03-17 DIAGNOSIS — M799 Soft tissue disorder, unspecified: Secondary | ICD-10-CM | POA: Diagnosis not present

## 2019-03-17 DIAGNOSIS — M25612 Stiffness of left shoulder, not elsewhere classified: Secondary | ICD-10-CM | POA: Diagnosis not present

## 2019-03-17 DIAGNOSIS — R293 Abnormal posture: Secondary | ICD-10-CM | POA: Diagnosis not present

## 2019-03-17 DIAGNOSIS — M25611 Stiffness of right shoulder, not elsewhere classified: Secondary | ICD-10-CM | POA: Diagnosis not present

## 2019-03-17 DIAGNOSIS — M25512 Pain in left shoulder: Secondary | ICD-10-CM | POA: Diagnosis not present

## 2019-03-17 DIAGNOSIS — I10 Essential (primary) hypertension: Secondary | ICD-10-CM | POA: Diagnosis not present

## 2019-03-17 DIAGNOSIS — M542 Cervicalgia: Secondary | ICD-10-CM | POA: Diagnosis not present

## 2019-03-17 DIAGNOSIS — M25511 Pain in right shoulder: Secondary | ICD-10-CM | POA: Diagnosis not present

## 2019-03-17 DIAGNOSIS — M2560 Stiffness of unspecified joint, not elsewhere classified: Secondary | ICD-10-CM | POA: Diagnosis not present

## 2019-03-19 DIAGNOSIS — M25612 Stiffness of left shoulder, not elsewhere classified: Secondary | ICD-10-CM | POA: Diagnosis not present

## 2019-03-19 DIAGNOSIS — M25511 Pain in right shoulder: Secondary | ICD-10-CM | POA: Diagnosis not present

## 2019-03-19 DIAGNOSIS — R293 Abnormal posture: Secondary | ICD-10-CM | POA: Diagnosis not present

## 2019-03-19 DIAGNOSIS — M25611 Stiffness of right shoulder, not elsewhere classified: Secondary | ICD-10-CM | POA: Diagnosis not present

## 2019-03-19 DIAGNOSIS — M799 Soft tissue disorder, unspecified: Secondary | ICD-10-CM | POA: Diagnosis not present

## 2019-03-19 DIAGNOSIS — M6281 Muscle weakness (generalized): Secondary | ICD-10-CM | POA: Diagnosis not present

## 2019-03-19 DIAGNOSIS — M25512 Pain in left shoulder: Secondary | ICD-10-CM | POA: Diagnosis not present

## 2019-03-19 DIAGNOSIS — M542 Cervicalgia: Secondary | ICD-10-CM | POA: Diagnosis not present

## 2019-03-19 DIAGNOSIS — I10 Essential (primary) hypertension: Secondary | ICD-10-CM | POA: Diagnosis not present

## 2019-03-19 DIAGNOSIS — M2560 Stiffness of unspecified joint, not elsewhere classified: Secondary | ICD-10-CM | POA: Diagnosis not present

## 2019-03-31 ENCOUNTER — Emergency Department (HOSPITAL_COMMUNITY)
Admission: EM | Admit: 2019-03-31 | Discharge: 2019-03-31 | Disposition: A | Discharge status: Home / Self Care | Payer: Medicare Other | Attending: Emergency Medicine | Admitting: Emergency Medicine

## 2019-03-31 ENCOUNTER — Encounter (HOSPITAL_COMMUNITY): Payer: Self-pay

## 2019-03-31 ENCOUNTER — Emergency Department (HOSPITAL_COMMUNITY): Payer: Medicare Other

## 2019-03-31 DIAGNOSIS — U071 COVID-19: Secondary | ICD-10-CM | POA: Diagnosis not present

## 2019-03-31 DIAGNOSIS — Z79899 Other long term (current) drug therapy: Secondary | ICD-10-CM | POA: Diagnosis not present

## 2019-03-31 DIAGNOSIS — M25519 Pain in unspecified shoulder: Secondary | ICD-10-CM | POA: Insufficient documentation

## 2019-03-31 DIAGNOSIS — R05 Cough: Secondary | ICD-10-CM | POA: Diagnosis present

## 2019-03-31 DIAGNOSIS — I10 Essential (primary) hypertension: Secondary | ICD-10-CM | POA: Insufficient documentation

## 2019-03-31 LAB — CBC WITH DIFFERENTIAL/PLATELET
Abs Immature Granulocytes: 0.01 10*3/uL (ref 0.00–0.07)
Basophils Absolute: 0 10*3/uL (ref 0.0–0.1)
Basophils Relative: 1 %
Eosinophils Absolute: 0 10*3/uL (ref 0.0–0.5)
Eosinophils Relative: 0 %
HCT: 51.1 % (ref 39.0–52.0)
Hemoglobin: 16.8 g/dL (ref 13.0–17.0)
Immature Granulocytes: 0 %
Lymphocytes Relative: 13 %
Lymphs Abs: 0.5 10*3/uL — ABNORMAL LOW (ref 0.7–4.0)
MCH: 31.2 pg (ref 26.0–34.0)
MCHC: 32.9 g/dL (ref 30.0–36.0)
MCV: 95 fL (ref 80.0–100.0)
Monocytes Absolute: 0.4 10*3/uL (ref 0.1–1.0)
Monocytes Relative: 12 %
Neutro Abs: 2.6 10*3/uL (ref 1.7–7.7)
Neutrophils Relative %: 74 %
Platelets: 124 10*3/uL — ABNORMAL LOW (ref 150–400)
RBC: 5.38 MIL/uL (ref 4.22–5.81)
RDW: 15.9 % — ABNORMAL HIGH (ref 11.5–15.5)
WBC: 3.6 10*3/uL — ABNORMAL LOW (ref 4.0–10.5)
nRBC: 0 % (ref 0.0–0.2)

## 2019-03-31 LAB — COMPREHENSIVE METABOLIC PANEL
ALT: 50 U/L — ABNORMAL HIGH (ref 0–44)
AST: 60 U/L — ABNORMAL HIGH (ref 15–41)
Albumin: 3.8 g/dL (ref 3.5–5.0)
Alkaline Phosphatase: 76 U/L (ref 38–126)
Anion gap: 19 — ABNORMAL HIGH (ref 5–15)
BUN: 31 mg/dL — ABNORMAL HIGH (ref 8–23)
CO2: 19 mmol/L — ABNORMAL LOW (ref 22–32)
Calcium: 8.2 mg/dL — ABNORMAL LOW (ref 8.9–10.3)
Chloride: 97 mmol/L — ABNORMAL LOW (ref 98–111)
Creatinine, Ser: 1.42 mg/dL — ABNORMAL HIGH (ref 0.61–1.24)
GFR calc Af Amer: 59 mL/min — ABNORMAL LOW (ref >60)
GFR calc non Af Amer: 51 mL/min — ABNORMAL LOW (ref >60)
Glucose, Bld: 90 mg/dL (ref 70–99)
Potassium: 4.5 mmol/L (ref 3.5–5.1)
Sodium: 135 mmol/L (ref 135–145)
Total Bilirubin: 0.6 mg/dL (ref 0.3–1.2)
Total Protein: 7.5 g/dL (ref 6.5–8.1)

## 2019-03-31 LAB — POC SARS CORONAVIRUS 2 AG -  ED: SARS Coronavirus 2 Ag: POSITIVE — AB

## 2019-03-31 MED ORDER — METHYLPREDNISOLONE SODIUM SUCC 125 MG IJ SOLR
125.0000 mg | Freq: Once | INTRAMUSCULAR | Status: AC
  Administered 2019-03-31: 125 mg via INTRAVENOUS
  Filled 2019-03-31: qty 2

## 2019-03-31 MED ORDER — BENZONATATE 100 MG PO CAPS
100.0000 mg | ORAL_CAPSULE | Freq: Once | ORAL | Status: AC
  Administered 2019-03-31: 100 mg via ORAL
  Filled 2019-03-31: qty 1

## 2019-03-31 MED ORDER — SODIUM CHLORIDE 0.9 % IV BOLUS
500.0000 mL | Freq: Once | INTRAVENOUS | Status: AC
  Administered 2019-03-31: 500 mL via INTRAVENOUS

## 2019-03-31 MED ORDER — ONDANSETRON HCL 4 MG/2ML IJ SOLN
4.0000 mg | Freq: Once | INTRAMUSCULAR | Status: AC
  Administered 2019-03-31: 4 mg via INTRAVENOUS
  Filled 2019-03-31: qty 2

## 2019-03-31 MED ORDER — ALBUTEROL SULFATE HFA 108 (90 BASE) MCG/ACT IN AERS
2.0000 | INHALATION_SPRAY | RESPIRATORY_TRACT | Status: DC | PRN
  Administered 2019-03-31: 2 via RESPIRATORY_TRACT
  Filled 2019-03-31: qty 6.7

## 2019-03-31 MED ORDER — BENZONATATE 100 MG PO CAPS: 100.0000 mg | ORAL_CAPSULE | Freq: Three times a day (TID) | ORAL | 0 refills | Status: DC | PRN

## 2019-03-31 NOTE — ED Provider Notes (Signed)
[facility] DEPT Provider Note   CSN: [id number] Arrival date & time: 03/31/19  1158     History Chief Complaint  Patient presents with  . Cough    Ruben [name] is a 68 y.o. male.  HPI    Patient is a difficult historian due to severe cough.  States has had a cough for the past 2 years but it is worsened this morning.  Cough is nonproductive.  Patient does complain of some bilateral arm pain which is been present for unknown period of time.  No known trauma.  Denies chest pain.  No grossly bloody or melanotic stools.  Patient does admit to vomiting but this appears to be posttussive in nature. Past Medical History:  Diagnosis Date  . Alcoholism ([place])   . Aortic atherosclerosis ([place]) 12/26/2016   Noted on CT  . Arthritis   . BPH (benign prostatic hyperplasia)   . Chronic constipation    takes magnesium  . Duodenal ulcer   . Dyspnea 12/17/2017  . ED (erectile dysfunction)   . Foley catheter in place    history of no longer in place  . GERD (gastroesophageal reflux disease)   . Heart palpitations    Occ  . Hiatal hernia   . History of adenomatous polyp of colon    tubular adenoma's 2005  . History of Helicobacter pylori infection    2008  . Hypercholesteremia   . Hypertension   . MR (mitral regurgitation)    Mild, noted on ECHO  . Pre-diabetes   . Pulmonary nodule 12/26/2016   noted on CT, 5 x 3 mm nodular opacity right upper lobe  . Rosacea   . Schatzki's ring    last dilated 02/ 2016  . Urinary retention    history of  . Varicocele 02/25/2014   Small to moderate left varicocele    Patient Active Problem List   Diagnosis Date Noted  . S/P Nissen fundoplication (without gastrostomy tube) procedure 04/26/2018    Past Surgical History:  Procedure Laterality Date  . BALLOON DILATION N/A 06/08/2014   Procedure: BALLOON DILATION;  Surgeon: Garlan Fair, MD;  Location: [facility];  Service: Endoscopy;  Laterality: N/A;  .  COLONOSCOPY WITH PROPOFOL N/A 06/08/2014   Procedure: COLONOSCOPY WITH PROPOFOL;  Surgeon: Garlan Fair, MD;  Location: [facility];  Service: Endoscopy;  Laterality: N/A;  . ESOPHAGOGASTRODUODENOSCOPY N/A 06/08/2014   Procedure: ESOPHAGOGASTRODUODENOSCOPY (EGD);  Surgeon: Garlan Fair, MD;  Location: [facility];  Service: Endoscopy;  Laterality: N/A;  . ESOPHAGOGASTRODUODENOSCOPY (EGD) WITH ESOPHAGEAL DILATION N/A 12/31/2012   Procedure: ESOPHAGOGASTRODUODENOSCOPY (EGD) WITH ESOPHAGEAL DILATION;  Surgeon: Garlan Fair, MD;  Location: [facility];  Service: Endoscopy;  Laterality: N/A;  . GREEN LIGHT LASER TURP (TRANSURETHRAL RESECTION OF PROSTATE N/A 12/28/2015   Procedure: GREEN LIGHT LASER TURP (TRANSURETHRAL RESECTION OF PROSTATE;  Surgeon: Festus Aloe, MD;  Location: [facility];  Service: Urology;  Laterality: N/A;       History reviewed. No pertinent family history.  Social History   Tobacco Use  . Smoking status: Never Smoker  . Smokeless tobacco: Never Used  Substance Use Topics  . Alcohol use: Yes    Comment: 3 beer or vodka daily  . Drug use: No    Home Medications Prior to Admission medications   Medication Sig Start Date End Date Taking? Authorizing Provider  acetaminophen (TYLENOL) 325 MG tablet Take 2 tablets (650 mg total) by mouth every 6 (six) hours.  Take scheduled for the next 2-3 days, then as needed for pain 04/28/18   Clovis Riley, MD  benzonatate (TESSALON) 100 MG capsule Take 1 capsule (100 mg total) by mouth 3 (three) times daily as needed for cough. 03/31/19   Julianne Rice, MD  cephALEXin (KEFLEX) 500 MG capsule Take 1 capsule (500 mg total) by mouth 3 (three) times daily. 11/24/18   Horton, Barbette Hair, MD  chlordiazePOXIDE (LIBRIUM) 25 MG capsule Take 25 mg by mouth 4 (four) times daily as needed for withdrawal or anxiety. 11/13/18   [provider]  disulfiram (ANTABUSE) 250 MG tablet Take 250 mg by mouth 2  (two) times daily. 11/13/18   [provider]  docusate sodium (COLACE) 100 MG capsule Take 1 capsule (100 mg total) by mouth 2 (two) times daily. Ok to stop taking if having loose bowel movements 04/28/18   Clovis Riley, MD  doxepin (SINEQUAN) 25 MG capsule Take 25 mg by mouth at bedtime. 10/23/18   [provider]  escitalopram (LEXAPRO) 20 MG tablet Take 20 mg by mouth daily. 10/23/18   [provider]  FLUoxetine (PROZAC) 20 MG capsule Take 20 mg by mouth daily. 08/21/18   [provider]  gabapentin (NEURONTIN) 300 MG capsule Take 300-600 mg by mouth See admin instructions. Take one capsule twice daily and two capsules at bedtime. 10/23/18   [provider]  lisinopril-hydrochlorothiazide (PRINZIDE,ZESTORETIC) 20-12.5 MG per tablet Take 2 tablets by mouth daily.     [provider]  methocarbamol (ROBAXIN) 500 MG tablet Take 1 tablet (500 mg total) by mouth every 6 (six) hours as needed for muscle spasms. 04/28/18   Clovis Riley, MD  naltrexone (DEPADE) 50 MG tablet Take 50 mg by mouth daily. 11/15/18   [provider]  ondansetron (ZOFRAN ODT) 4 MG disintegrating tablet Take 1 tablet (4 mg total) by mouth every 8 (eight) hours as needed for nausea or vomiting. 04/28/18   Clovis Riley, MD  ondansetron (ZOFRAN) 4 MG tablet Take 4 mg by mouth 4 (four) times daily as needed for nausea/vomiting. 07/25/18   [provider]  traZODone (DESYREL) 100 MG tablet Take 100 mg by mouth at bedtime as needed for sleep.    [provider]    Allergies    Patient has no known allergies.  Review of Systems   Review of Systems  Constitutional: Negative for chills and fever.  HENT: Negative for sore throat and trouble swallowing.   Eyes: Negative for visual disturbance.  Respiratory: Positive for cough and shortness of breath.   Cardiovascular: Negative for chest pain.  Gastrointestinal: Positive for vomiting. Negative for  abdominal pain, constipation, diarrhea and nausea.  Musculoskeletal: Negative for back pain, neck pain and neck stiffness.  Skin: Negative for rash and wound.  Neurological: Negative for dizziness, weakness, light-headedness, numbness and headaches.  All other systems reviewed and are negative.   Physical Exam Updated Vital Signs BP 137/79   Pulse 94   Temp 98.5 F (36.9 C) (Oral)   Resp 17   SpO2 95%   Physical Exam Vitals and nursing note reviewed.  Constitutional:      Appearance: He is well-developed.     Comments: Persistently coughing  HENT:     Head: Normocephalic and atraumatic.     Nose: Nose normal.  Eyes:     Pupils: Pupils are equal, round, and reactive to light.  Cardiovascular:     Rate and Rhythm: Regular rhythm. Tachycardia  present.  Pulmonary:     Comments: Shallow breaths.  Patient coughing with deep breathing.  Diminished breath sounds at bilateral bases. Abdominal:     General: Bowel sounds are normal. There is no distension.     Palpations: Abdomen is soft.     Tenderness: There is no abdominal tenderness. There is no right CVA tenderness, left CVA tenderness, guarding or rebound.  Musculoskeletal:        General: No swelling, tenderness, deformity or signs of injury. Normal range of motion.     Cervical back: Normal range of motion and neck supple. No rigidity or tenderness.     Right lower leg: No edema.     Left lower leg: No edema.  Lymphadenopathy:     Cervical: No cervical adenopathy.  Skin:    General: Skin is warm and dry.     Capillary Refill: Capillary refill takes less than 2 seconds.     Findings: No erythema or rash.  Neurological:     General: No focal deficit present.     Mental Status: He is alert and oriented to person, place, and time.  Psychiatric:        Behavior: Behavior normal.     ED Results / Procedures / Treatments   Labs (all labs ordered are listed, but only abnormal results are displayed) Labs Reviewed  CBC  WITH DIFFERENTIAL/PLATELET - Abnormal; Notable for the following components:      Result Value   WBC 3.6 (*)    RDW 15.9 (*)    Platelets 124 (*)    Lymphs Abs 0.5 (*)    All other components within normal limits  COMPREHENSIVE METABOLIC PANEL - Abnormal; Notable for the following components:   Chloride 97 (*)    CO2 19 (*)    BUN 31 (*)    Creatinine, Ser 1.42 (*)    Calcium 8.2 (*)    AST 60 (*)    ALT 50 (*)    GFR calc non Af Amer 51 (*)    GFR calc Af Amer 59 (*)    Anion gap 19 (*)    All other components within normal limits  POC SARS CORONAVIRUS 2 AG -  ED - Abnormal; Notable for the following components:   SARS Coronavirus 2 Ag POSITIVE (*)    All other components within normal limits    EKG EKG Interpretation  Date/Time:  Monday March 31 2019 13:23:23 EST Ventricular Rate:  100 PR Interval:    QRS Duration: 83 QT Interval:  341 QTC Calculation: 440 R Axis:   13 Text Interpretation: Sinus tachycardia RSR' in V1 or V2, right VCD or RVH Confirmed by Julianne Rice [phone]) on 03/31/2019 2:49:15 PM   Radiology No results found.  Procedures Procedures (including critical care time)  Medications Ordered in ED Medications  sodium chloride 0.9 % bolus 500 mL (0 mLs Intravenous Stopped 03/31/19 1441)  ondansetron (ZOFRAN) injection 4 mg (4 mg Intravenous Given 03/31/19 1315)  benzonatate (TESSALON) capsule 100 mg (100 mg Oral Given 03/31/19 1305)  methylPREDNISolone sodium succinate (SOLU-MEDROL) 125 mg/2 mL injection 125 mg (125 mg Intravenous Given 03/31/19 1315)    ED Course  I have reviewed the triage vital signs and the nursing notes.  Pertinent labs & imaging results that were available during my care of the patient were reviewed by me and considered in my medical decision making (see chart for details).    MDM Rules/Calculators/A&P  Coughing is improved after Tessalon and albuterol.  Heart rate has improved with IV fluids.   Patient is Covid positive.  Given quarantine instructions.  Return precautions have been given.  Final Clinical Impression(s) / ED Diagnoses Final diagnoses:  [id number]    Rx / DC Orders ED Discharge Orders         Ordered    benzonatate (TESSALON) 100 MG capsule  3 times daily PRN     03/31/19 1445           Julianne Rice, MD 04/03/19 0745

## 2019-03-31 NOTE — ED Triage Notes (Signed)
Pt presents via EMS with c/o cough for the past 2 years. Pt reports that he went to the MD today because of bilateral arm pain. Pt reports he does have a hernia that pushes against his lungs. Pt was 93% on RA upon arrival, 97% after being placed on 2L of O2 by EMS.

## 2019-04-01 ENCOUNTER — Telehealth: Payer: Self-pay | Admitting: Infectious Diseases

## 2019-04-01 NOTE — Telephone Encounter (Signed)
Called to discuss with patient about Covid symptoms and the use of bamlanivimab, a monoclonal antibody infusion for those with mild to moderate Covid symptoms and at a high risk of hospitalization.  Pt is qualified for this infusion at the [facility] infusion center due to Age > 65   Message left to call back - MyChart sent   Symptom onset 12/21? Will need to clarify with patient.

## 2019-04-23 ENCOUNTER — Telehealth: Payer: Self-pay | Admitting: Infectious Diseases

## 2019-04-23 NOTE — Telephone Encounter (Signed)
He has gone about 10 days without food (although he has forced himself to eat a few things here and there), lost 27 lbs, severe anorexia, fatigue. He does occasionally have double vision. He states he drinks water and electrolyte.  He denies any headaches, tingling, unilateral weakness.   ED precautions given that would warrant evaluation.   Given significant weight loss I suspect that he no longer needs htn medication at current dose. I encouraged him to keep an eye on blood pressures at home especially during moments when the dizziness/vision changes occur as he may be orthostatic.   He will work with his PCP as planned and is going for blood work this week.

## 2019-04-25 DIAGNOSIS — R634 Abnormal weight loss: Secondary | ICD-10-CM | POA: Diagnosis not present

## 2019-04-25 DIAGNOSIS — H532 Diplopia: Secondary | ICD-10-CM | POA: Diagnosis not present

## 2019-04-25 DIAGNOSIS — U071 COVID-19: Secondary | ICD-10-CM | POA: Diagnosis not present

## 2019-05-09 DIAGNOSIS — R899 Unspecified abnormal finding in specimens from other organs, systems and tissues: Secondary | ICD-10-CM | POA: Diagnosis not present

## 2019-05-27 DIAGNOSIS — E538 Deficiency of other specified B group vitamins: Secondary | ICD-10-CM | POA: Diagnosis not present

## 2019-06-09 DIAGNOSIS — D3131 Benign neoplasm of right choroid: Secondary | ICD-10-CM | POA: Diagnosis not present

## 2019-06-21 ENCOUNTER — Inpatient Hospital Stay (HOSPITAL_COMMUNITY)
Admission: EM | Admit: 2019-06-21 | Discharge: 2019-06-27 | DRG: 897 | Disposition: A | Discharge status: Home Health | Payer: Medicare Other | Attending: Family Medicine | Admitting: Family Medicine

## 2019-06-21 ENCOUNTER — Other Ambulatory Visit: Payer: Self-pay

## 2019-06-21 ENCOUNTER — Emergency Department (HOSPITAL_COMMUNITY): Payer: Medicare Other

## 2019-06-21 DIAGNOSIS — N400 Enlarged prostate without lower urinary tract symptoms: Secondary | ICD-10-CM | POA: Diagnosis present

## 2019-06-21 DIAGNOSIS — R4182 Altered mental status, unspecified: Secondary | ICD-10-CM | POA: Diagnosis not present

## 2019-06-21 DIAGNOSIS — I700 Atherosclerosis of aorta: Secondary | ICD-10-CM | POA: Diagnosis present

## 2019-06-21 DIAGNOSIS — I10 Essential (primary) hypertension: Secondary | ICD-10-CM | POA: Diagnosis present

## 2019-06-21 DIAGNOSIS — D6959 Other secondary thrombocytopenia: Secondary | ICD-10-CM | POA: Diagnosis present

## 2019-06-21 DIAGNOSIS — F10921 Alcohol use, unspecified with intoxication delirium: Secondary | ICD-10-CM | POA: Diagnosis not present

## 2019-06-21 DIAGNOSIS — Z79899 Other long term (current) drug therapy: Secondary | ICD-10-CM | POA: Diagnosis not present

## 2019-06-21 DIAGNOSIS — Z8616 Personal history of COVID-19: Secondary | ICD-10-CM

## 2019-06-21 DIAGNOSIS — E860 Dehydration: Secondary | ICD-10-CM | POA: Diagnosis present

## 2019-06-21 DIAGNOSIS — E876 Hypokalemia: Secondary | ICD-10-CM | POA: Diagnosis present

## 2019-06-21 DIAGNOSIS — I350 Nonrheumatic aortic (valve) stenosis: Secondary | ICD-10-CM | POA: Diagnosis present

## 2019-06-21 DIAGNOSIS — Z8711 Personal history of peptic ulcer disease: Secondary | ICD-10-CM | POA: Diagnosis not present

## 2019-06-21 DIAGNOSIS — N179 Acute kidney failure, unspecified: Secondary | ICD-10-CM | POA: Diagnosis present

## 2019-06-21 DIAGNOSIS — F10239 Alcohol dependence with withdrawal, unspecified: Secondary | ICD-10-CM | POA: Diagnosis present

## 2019-06-21 DIAGNOSIS — F10929 Alcohol use, unspecified with intoxication, unspecified: Secondary | ICD-10-CM | POA: Diagnosis present

## 2019-06-21 DIAGNOSIS — Z20822 Contact with and (suspected) exposure to covid-19: Secondary | ICD-10-CM | POA: Diagnosis present

## 2019-06-21 DIAGNOSIS — Z87891 Personal history of nicotine dependence: Secondary | ICD-10-CM | POA: Diagnosis not present

## 2019-06-21 DIAGNOSIS — Y908 Blood alcohol level of 240 mg/100 ml or more: Secondary | ICD-10-CM | POA: Diagnosis present

## 2019-06-21 DIAGNOSIS — K5909 Other constipation: Secondary | ICD-10-CM | POA: Diagnosis present

## 2019-06-21 DIAGNOSIS — F10231 Alcohol dependence with withdrawal delirium: Secondary | ICD-10-CM | POA: Diagnosis not present

## 2019-06-21 DIAGNOSIS — F10229 Alcohol dependence with intoxication, unspecified: Principal | ICD-10-CM | POA: Diagnosis present

## 2019-06-21 DIAGNOSIS — K219 Gastro-esophageal reflux disease without esophagitis: Secondary | ICD-10-CM | POA: Diagnosis present

## 2019-06-21 DIAGNOSIS — E785 Hyperlipidemia, unspecified: Secondary | ICD-10-CM | POA: Diagnosis present

## 2019-06-21 DIAGNOSIS — R000 Tachycardia, unspecified: Secondary | ICD-10-CM | POA: Diagnosis present

## 2019-06-21 LAB — COMPREHENSIVE METABOLIC PANEL
ALT: 67 U/L — ABNORMAL HIGH (ref 0–44)
ALT: 60 U/L — ABNORMAL HIGH (ref 0–44)
AST: 82 U/L — ABNORMAL HIGH (ref 15–41)
AST: 72 U/L — ABNORMAL HIGH (ref 15–41)
Albumin: 3.5 g/dL (ref 3.5–5.0)
Albumin: 3.1 g/dL — ABNORMAL LOW (ref 3.5–5.0)
Alkaline Phosphatase: 103 U/L (ref 38–126)
Alkaline Phosphatase: 103 U/L (ref 38–126)
Anion gap: 16 — ABNORMAL HIGH (ref 5–15)
Anion gap: 10 (ref 5–15)
BUN: 29 mg/dL — ABNORMAL HIGH (ref 8–23)
BUN: 24 mg/dL — ABNORMAL HIGH (ref 8–23)
CO2: 23 mmol/L (ref 22–32)
CO2: 25 mmol/L (ref 22–32)
Calcium: 8 mg/dL — ABNORMAL LOW (ref 8.9–10.3)
Calcium: 7.4 mg/dL — ABNORMAL LOW (ref 8.9–10.3)
Chloride: 101 mmol/L (ref 98–111)
Chloride: 106 mmol/L (ref 98–111)
Creatinine, Ser: 2.23 mg/dL — ABNORMAL HIGH (ref 0.61–1.24)
Creatinine, Ser: 1.94 mg/dL — ABNORMAL HIGH (ref 0.61–1.24)
GFR calc Af Amer: 34 mL/min — ABNORMAL LOW (ref >60)
GFR calc Af Amer: 40 mL/min — ABNORMAL LOW (ref >60)
GFR calc non Af Amer: 29 mL/min — ABNORMAL LOW (ref >60)
GFR calc non Af Amer: 35 mL/min — ABNORMAL LOW (ref >60)
Glucose, Bld: 150 mg/dL — ABNORMAL HIGH (ref 70–99)
Glucose, Bld: 109 mg/dL — ABNORMAL HIGH (ref 70–99)
Potassium: 4.1 mmol/L (ref 3.5–5.1)
Potassium: 4.4 mmol/L (ref 3.5–5.1)
Sodium: 140 mmol/L (ref 135–145)
Sodium: 141 mmol/L (ref 135–145)
Total Bilirubin: 0.7 mg/dL (ref 0.3–1.2)
Total Bilirubin: 0.6 mg/dL (ref 0.3–1.2)
Total Protein: 6.4 g/dL — ABNORMAL LOW (ref 6.5–8.1)
Total Protein: 5.7 g/dL — ABNORMAL LOW (ref 6.5–8.1)

## 2019-06-21 LAB — CBC WITH DIFFERENTIAL/PLATELET
Abs Immature Granulocytes: 0.01 10*3/uL (ref 0.00–0.07)
Basophils Absolute: 0.1 10*3/uL (ref 0.0–0.1)
Basophils Relative: 2 %
Eosinophils Absolute: 0.1 10*3/uL (ref 0.0–0.5)
Eosinophils Relative: 1 %
HCT: 35.4 % — ABNORMAL LOW (ref 39.0–52.0)
Hemoglobin: 11.9 g/dL — ABNORMAL LOW (ref 13.0–17.0)
Immature Granulocytes: 0 %
Lymphocytes Relative: 15 %
Lymphs Abs: 0.8 10*3/uL (ref 0.7–4.0)
MCH: 32 pg (ref 26.0–34.0)
MCHC: 33.6 g/dL (ref 30.0–36.0)
MCV: 95.2 fL (ref 80.0–100.0)
Monocytes Absolute: 0.4 10*3/uL (ref 0.1–1.0)
Monocytes Relative: 9 %
Neutro Abs: 3.5 10*3/uL (ref 1.7–7.7)
Neutrophils Relative %: 73 %
Platelets: 102 10*3/uL — ABNORMAL LOW (ref 150–400)
RBC: 3.72 MIL/uL — ABNORMAL LOW (ref 4.22–5.81)
RDW: 15.8 % — ABNORMAL HIGH (ref 11.5–15.5)
WBC: 4.9 10*3/uL (ref 4.0–10.5)
nRBC: 0 % (ref 0.0–0.2)

## 2019-06-21 LAB — RESPIRATORY PANEL BY RT PCR (FLU A&B, COVID)
Influenza A by PCR: NEGATIVE
Influenza B by PCR: NEGATIVE
SARS Coronavirus 2 by RT PCR: NEGATIVE

## 2019-06-21 LAB — CK: Total CK: 88 U/L (ref 49–397)

## 2019-06-21 LAB — PHOSPHORUS: Phosphorus: 3.6 mg/dL (ref 2.5–4.6)

## 2019-06-21 LAB — ETHANOL: Alcohol, Ethyl (B): 372 mg/dL (ref <10)

## 2019-06-21 LAB — MAGNESIUM: Magnesium: 1.7 mg/dL (ref 1.7–2.4)

## 2019-06-21 MED ORDER — LORAZEPAM 2 MG/ML IJ SOLN
1.0000 mg | INTRAMUSCULAR | Status: AC | PRN
  Administered 2019-06-21: 2 mg via INTRAVENOUS
  Administered 2019-06-22: 1 mg via INTRAVENOUS
  Administered 2019-06-22: 2 mg via INTRAVENOUS
  Administered 2019-06-22: 1 mg via INTRAVENOUS
  Administered 2019-06-22: 2 mg via INTRAVENOUS
  Administered 2019-06-23 (×3): 4 mg via INTRAVENOUS
  Administered 2019-06-23: 2 mg via INTRAVENOUS
  Administered 2019-06-23: 3 mg via INTRAVENOUS
  Administered 2019-06-23: 2 mg via INTRAVENOUS
  Administered 2019-06-23: 4 mg via INTRAVENOUS
  Administered 2019-06-23: 2 mg via INTRAVENOUS
  Administered 2019-06-23: 3 mg via INTRAVENOUS
  Filled 2019-06-21: qty 1
  Filled 2019-06-21 (×5): qty 2
  Filled 2019-06-21 (×3): qty 1
  Filled 2019-06-21: qty 2
  Filled 2019-06-21 (×4): qty 1

## 2019-06-21 MED ORDER — THIAMINE HCL 100 MG/ML IJ SOLN
Freq: Once | INTRAVENOUS | Status: AC
  Filled 2019-06-21 (×2): qty 1000

## 2019-06-21 MED ORDER — THIAMINE HCL 100 MG PO TABS
100.0000 mg | ORAL_TABLET | Freq: Every day | ORAL | Status: DC
  Administered 2019-06-21 – 2019-06-27 (×7): 100 mg via ORAL
  Filled 2019-06-21 (×7): qty 1

## 2019-06-21 MED ORDER — ENOXAPARIN SODIUM 40 MG/0.4ML ~~LOC~~ SOLN
40.0000 mg | SUBCUTANEOUS | Status: DC
  Administered 2019-06-21: 40 mg via SUBCUTANEOUS
  Filled 2019-06-21: qty 0.4

## 2019-06-21 MED ORDER — ADULT MULTIVITAMIN W/MINERALS CH
1.0000 | ORAL_TABLET | Freq: Every day | ORAL | Status: DC
  Administered 2019-06-21 – 2019-06-27 (×7): 1 via ORAL
  Filled 2019-06-21 (×6): qty 1

## 2019-06-21 MED ORDER — SODIUM CHLORIDE 0.9 % IV BOLUS (SEPSIS)
1000.0000 mL | Freq: Once | INTRAVENOUS | Status: AC
  Administered 2019-06-21: 19:00:00 1000 mL via INTRAVENOUS

## 2019-06-21 MED ORDER — THIAMINE HCL 100 MG/ML IJ SOLN
100.0000 mg | Freq: Once | INTRAMUSCULAR | Status: AC
  Administered 2019-06-21: 100 mg via INTRAVENOUS
  Filled 2019-06-21: qty 2

## 2019-06-21 MED ORDER — THIAMINE HCL 100 MG/ML IJ SOLN
100.0000 mg | Freq: Every day | INTRAMUSCULAR | Status: DC
  Filled 2019-06-21 (×2): qty 2

## 2019-06-21 MED ORDER — ENSURE ENLIVE PO LIQD: 237.0000 mL | Freq: Two times a day (BID) | ORAL | Status: DC

## 2019-06-21 MED ORDER — LORAZEPAM 2 MG/ML IJ SOLN
1.0000 mg | Freq: Once | INTRAMUSCULAR | Status: AC
  Administered 2019-06-21: 1 mg via INTRAVENOUS
  Filled 2019-06-21: qty 1

## 2019-06-21 MED ORDER — ONDANSETRON HCL 4 MG/2ML IJ SOLN: 4.0000 mg | Freq: Four times a day (QID) | INTRAMUSCULAR | Status: DC | PRN

## 2019-06-21 MED ORDER — FOLIC ACID 1 MG PO TABS
1.0000 mg | ORAL_TABLET | Freq: Every day | ORAL | Status: DC
  Administered 2019-06-21 – 2019-06-27 (×7): 1 mg via ORAL
  Filled 2019-06-21 (×7): qty 1

## 2019-06-21 MED ORDER — LORAZEPAM 1 MG PO TABS
1.0000 mg | ORAL_TABLET | ORAL | Status: AC | PRN
  Administered 2019-06-22 – 2019-06-23 (×3): 1 mg via ORAL
  Filled 2019-06-21 (×3): qty 1

## 2019-06-21 MED ORDER — ONDANSETRON HCL 4 MG PO TABS: 4.0000 mg | ORAL_TABLET | Freq: Four times a day (QID) | ORAL | Status: DC | PRN

## 2019-06-21 MED ORDER — SODIUM CHLORIDE 0.9 % IV SOLN
1000.0000 mL | INTRAVENOUS | Status: DC
  Administered 2019-06-21 (×2): 1000 mL via INTRAVENOUS

## 2019-06-21 NOTE — ED Triage Notes (Addendum)
Pt to ED by [facility] with c/o of ETOH intoxication. [facility] was originally called out for "stroke" because he drank too much and passed out, woke up on floor. Pt states he relapsed last night and has consumed 2 pints of liquor. Pt was given 500 cc Patient states "I need detox" 115/80 BP

## 2019-06-21 NOTE — ED Notes (Signed)
Called patients cousin Westley Hummer and relayed where patient was transferred to and what is going on with him.

## 2019-06-21 NOTE — ED Provider Notes (Addendum)
[facility] DEPT Provider Note   CSN: [id number] Arrival date & time: 06/21/19  1721     History Chief Complaint  Patient presents with  . Alcohol Intoxication    Ruben [name] is a 69 y.o. male.  HPI   Pt states he is here for help with his alcohol use.  Pt admits to drinking alcohol heavily.  Patient initially called EMS today because he passed out after drinking too much.  He woke up on the floor.  Patient was concerned he may have had a stroke.  Feels like his balance has been off.  Patient states he started drinking him last night after period of sobriety.  He had 2 pints of liquor.  Patient complains of tremulousness.  He also has a burning sensation in his hands.  Past Medical History:  Diagnosis Date  . Alcoholism ([place])   . Aortic atherosclerosis ([place]) 12/26/2016   Noted on CT  . Arthritis   . BPH (benign prostatic hyperplasia)   . Chronic constipation    takes magnesium  . Duodenal ulcer   . Dyspnea 12/17/2017  . ED (erectile dysfunction)   . Foley catheter in place    history of no longer in place  . GERD (gastroesophageal reflux disease)   . Heart palpitations    Occ  . Hiatal hernia   . History of adenomatous polyp of colon    tubular adenoma's 2005  . History of Helicobacter pylori infection    2008  . Hypercholesteremia   . Hypertension   . MR (mitral regurgitation)    Mild, noted on ECHO  . Pre-diabetes   . Pulmonary nodule 12/26/2016   noted on CT, 5 x 3 mm nodular opacity right upper lobe  . Rosacea   . Schatzki's ring    last dilated 02/ 2016  . Urinary retention    history of  . Varicocele 02/25/2014   Small to moderate left varicocele    Patient Active Problem List   Diagnosis Date Noted  . S/P Nissen fundoplication (without gastrostomy tube) procedure 04/26/2018    Past Surgical History:  Procedure Laterality Date  . BALLOON DILATION N/A 06/08/2014   Procedure: BALLOON DILATION;  Surgeon: Garlan Fair, MD;  Location: [facility];  Service: Endoscopy;  Laterality: N/A;  . COLONOSCOPY WITH PROPOFOL N/A 06/08/2014   Procedure: COLONOSCOPY WITH PROPOFOL;  Surgeon: Garlan Fair, MD;  Location: [facility];  Service: Endoscopy;  Laterality: N/A;  . ESOPHAGOGASTRODUODENOSCOPY N/A 06/08/2014   Procedure: ESOPHAGOGASTRODUODENOSCOPY (EGD);  Surgeon: Garlan Fair, MD;  Location: [facility];  Service: Endoscopy;  Laterality: N/A;  . ESOPHAGOGASTRODUODENOSCOPY (EGD) WITH ESOPHAGEAL DILATION N/A 12/31/2012   Procedure: ESOPHAGOGASTRODUODENOSCOPY (EGD) WITH ESOPHAGEAL DILATION;  Surgeon: Garlan Fair, MD;  Location: [facility];  Service: Endoscopy;  Laterality: N/A;  . GREEN LIGHT LASER TURP (TRANSURETHRAL RESECTION OF PROSTATE N/A 12/28/2015   Procedure: GREEN LIGHT LASER TURP (TRANSURETHRAL RESECTION OF PROSTATE;  Surgeon: Festus Aloe, MD;  Location: [facility];  Service: Urology;  Laterality: N/A;       No family history on file.  Social History   Tobacco Use  . Smoking status: Never Smoker  . Smokeless tobacco: Never Used  Substance Use Topics  . Alcohol use: Yes    Comment: 3 beer or vodka daily  . Drug use: No    Home Medications Prior to Admission medications   Medication Sig Start Date End Date Taking? Authorizing Provider  acetaminophen (TYLENOL)  325 MG tablet Take 2 tablets (650 mg total) by mouth every 6 (six) hours. Take scheduled for the next 2-3 days, then as needed for pain 04/28/18   Clovis Riley, MD  benzonatate (TESSALON) 100 MG capsule Take 1 capsule (100 mg total) by mouth 3 (three) times daily as needed for cough. 03/31/19   Julianne Rice, MD  cephALEXin (KEFLEX) 500 MG capsule Take 1 capsule (500 mg total) by mouth 3 (three) times daily. 11/24/18   Horton, Barbette Hair, MD  chlordiazePOXIDE (LIBRIUM) 25 MG capsule Take 25 mg by mouth 4 (four) times daily as needed for withdrawal or anxiety. 11/13/18   [provider]    disulfiram (ANTABUSE) 250 MG tablet Take 250 mg by mouth 2 (two) times daily. 11/13/18   [provider]  docusate sodium (COLACE) 100 MG capsule Take 1 capsule (100 mg total) by mouth 2 (two) times daily. Ok to stop taking if having loose bowel movements 04/28/18   Clovis Riley, MD  doxepin (SINEQUAN) 25 MG capsule Take 25 mg by mouth at bedtime. 10/23/18   [provider]  escitalopram (LEXAPRO) 20 MG tablet Take 20 mg by mouth daily. 10/23/18   [provider]  FLUoxetine (PROZAC) 20 MG capsule Take 20 mg by mouth daily. 08/21/18   [provider]  gabapentin (NEURONTIN) 300 MG capsule Take 300-600 mg by mouth See admin instructions. Take one capsule twice daily and two capsules at bedtime. 10/23/18   [provider]  lisinopril-hydrochlorothiazide (PRINZIDE,ZESTORETIC) 20-12.5 MG per tablet Take 2 tablets by mouth daily.     [provider]  methocarbamol (ROBAXIN) 500 MG tablet Take 1 tablet (500 mg total) by mouth every 6 (six) hours as needed for muscle spasms. 04/28/18   Clovis Riley, MD  naltrexone (DEPADE) 50 MG tablet Take 50 mg by mouth daily. 11/15/18   [provider]  ondansetron (ZOFRAN ODT) 4 MG disintegrating tablet Take 1 tablet (4 mg total) by mouth every 8 (eight) hours as needed for nausea or vomiting. 04/28/18   Clovis Riley, MD  ondansetron (ZOFRAN) 4 MG tablet Take 4 mg by mouth 4 (four) times daily as needed for nausea/vomiting. 07/25/18   [provider]  traZODone (DESYREL) 100 MG tablet Take 100 mg by mouth at bedtime as needed for sleep.    [provider]    Allergies    Patient has no known allergies.  Review of Systems   Review of Systems  All other systems reviewed and are negative.   Physical Exam Updated Vital Signs BP (!) 117/96   Pulse 94   Temp 98.1 F (36.7 C) (Oral)   Resp 15   SpO2 98%   Physical Exam Vitals and nursing note reviewed.  Constitutional:       General: He is not in acute distress.    Appearance: He is well-developed.  HENT:     Head: Normocephalic and atraumatic.     Right Ear: External ear normal.     Left Ear: External ear normal.  Eyes:     General: No scleral icterus.       Right eye: No discharge.        Left eye: No discharge.     Conjunctiva/sclera: Conjunctivae normal.  Neck:     Trachea: No tracheal deviation.  Cardiovascular:     Rate and Rhythm: Normal rate and regular rhythm.  Pulmonary:     Effort: Pulmonary effort is normal. No respiratory distress.  Breath sounds: Normal breath sounds. No stridor. No wheezing or rales.  Abdominal:     General: Bowel sounds are normal. There is no distension.     Palpations: Abdomen is soft.     Tenderness: There is no abdominal tenderness. There is no guarding or rebound.  Musculoskeletal:        General: No tenderness.     Cervical back: Neck supple.  Skin:    General: Skin is warm and dry.     Findings: No rash.  Neurological:     Mental Status: He is alert.     Cranial Nerves: No cranial nerve deficit (no facial droop, extraocular movements intact,  slurred speech ).     Sensory: No sensory deficit.     Motor: No abnormal muscle tone or seizure activity.     Comments: No tremor noted with finger-to-nose exam, patient initially demonstrated bilateral hand tremors to me however they seem to resolve when he was distracted and performing part of the neurologic exam     ED Results / Procedures / Treatments   Labs (all labs ordered are listed, but only abnormal results are displayed) Labs Reviewed  COMPREHENSIVE METABOLIC PANEL - Abnormal; Notable for the following components:      Result Value   Glucose, Bld 150 (*)    BUN 29 (*)    Creatinine, Ser 2.23 (*)    Calcium 8.0 (*)    Total Protein 6.4 (*)    AST 82 (*)    ALT 67 (*)    GFR calc non Af Amer 29 (*)    GFR calc Af Amer 34 (*)    Anion gap 16 (*)    All other components within normal limits    ETHANOL - Abnormal; Notable for the following components:   Alcohol, Ethyl (B) 372 (*)    All other components within normal limits  CBC WITH DIFFERENTIAL/PLATELET - Abnormal; Notable for the following components:   RBC 3.72 (*)    Hemoglobin 11.9 (*)    HCT 35.4 (*)    RDW 15.8 (*)    Platelets 102 (*)    All other components within normal limits  RAPID URINE DRUG SCREEN, HOSP PERFORMED  CK    EKG EKG Interpretation  Date/Time:  Saturday June 21 2019 18:20:48 EST Ventricular Rate:  95 PR Interval:  168 QRS Duration: 82 QT Interval:  368 QTC Calculation: 462 R Axis:   12 Text Interpretation: Normal sinus rhythm Normal ECG No significant change since last tracing Confirmed by Dorie Rank [phone]) on 06/21/2019 7:54:57 PM   Radiology CT Head Wo Contrast  Result Date: 06/21/2019 CLINICAL DATA:  69 year old male with altered mental status and ataxia. EXAM: CT HEAD WITHOUT CONTRAST TECHNIQUE: Contiguous axial images were obtained from the base of the skull through the vertex without intravenous contrast. COMPARISON:  11/23/2018 CT FINDINGS: Brain: No evidence of acute infarction, hemorrhage, hydrocephalus, extra-axial collection or mass lesion/mass effect. Atrophy and mild chronic small-vessel white matter ischemic changes again noted. Vascular: Carotid and vertebral atherosclerotic calcifications are noted. Skull: Normal. Negative for fracture or focal lesion. Sinuses/Orbits: No acute finding. Other: None. IMPRESSION: 1. No evidence of acute intracranial abnormality. 2. Atrophy and chronic small-vessel white matter ischemic changes. Electronically Signed   By: Margarette Canada M.D.   On: 06/21/2019 18:20    Procedures Procedures (including critical care time)  Medications Ordered in ED Medications  sodium chloride 0.9 % bolus 1,000 mL (1,000 mLs Intravenous New Bag/Given 06/21/19 1837)  Followed by  0.9 %  sodium chloride infusion (1,000 mLs Intravenous New Bag/Given 06/21/19 1837)   LORazepam (ATIVAN) injection 1 mg (has no administration in time range)  thiamine (B-1) injection 100 mg (100 mg Intravenous Given 06/21/19 1838)    ED Course  I have reviewed the triage vital signs and the nursing notes.  Pertinent labs & imaging results that were available during my care of the patient were reviewed by me and considered in my medical decision making (see chart for details).  Clinical Course as of Jun 20 1953  Sat Jun 21, 2019  1925 Creatinine is elevated compared to recent   [place] reviewed.  Patient has elevated BUN and creatinine.  Significantly elevated alcohol level.  CT scan without acute findings.   [JK]    Clinical Course User Index [JK] Dorie Rank, MD   MDM Rules/Calculators/A&P                      Patient presented to ED for evaluation of alcohol intoxication and difficulty with his balance.  Patient's ED work-up is notable for severe alcohol intoxication.  He also has evidence of dehydration and acute kidney injury.  Patient is at risk for DTs considering his age and his symptoms. Ativan IV given. I will consult the medical service for overnight observation, monitoring for alcohol withdrawal  Note:  Pt had covid in December.  Will not retest. Final Clinical Impression(s) / ED Diagnoses Final diagnoses:  Alcoholic intoxication with complication ([place])  AKI (acute kidney injury) ([place])      Dorie Rank, MD 06/21/19 1941    Dorie Rank, MD 06/21/19 [place]

## 2019-06-21 NOTE — ED Notes (Signed)
Date and time results received: 06/21/19 7:26 PM    Test: Alcohol Critical Value: 372  Name of Provider Notified: Dr Tomi Bamberger

## 2019-06-21 NOTE — H&P (Signed)
History and Physical   Ruben [name] [id number] DOB: 04-Jun-1950 DOA: 06/21/2019  Referring MD/NP/PA: Dr. Tomi Bamberger  PCP: [name], MD   Outpatient Specialists: None  Patient coming from: Home  Chief Complaint: Alcohol abuse  HPI: Ruben [name] is a 69 y.o. male with medical history significant of alcoholism, BPH, aortic sclerosis, COVID-19 infection in December 2020, osteoarthritis, GERD, hyperlipidemia, hypertension who has quit smoking and drinking in the past but recently went back to drinking.  Patient came to the ER intoxicated and wanting to quit.  He has been weak dizzy.  Patient said he woke up on the floor today.  He was scared that he may have had a stroke.  He only took 2 pints of liquor but feeling bad and tremulous.  He wants to be admitted for detoxification and fear of falling and hurting himself.  Patient has elevated alcohol level and starting to show signs of withdrawal.  He is being admitted for further work-up.  ED Course: Temperature 99.2 blood pressure 120/74 pulse 109 respiratory rate of 20 oxygen sats 94% on room air.  Sodium 141 potassium 4.4 chloride 106 CO2 25 glucose 109 BUN 24 creatinine 1.94.  Calcium is 7.4.  Albumin 3.1.  AST and ALT slightly elevated.  COVID-19 is negative.  Head CT without contrast negative and EKG showed normal sinus rhythm with a rate of 95.  Mild ST depressions but does not appear to be new.  Alcohol level is 372.  Patient being admitted with alcoholic intoxication.  Review of Systems: As per HPI otherwise 10 point review of systems negative.    Past Medical History:  Diagnosis Date  . Alcoholism ([place])   . Aortic atherosclerosis ([place]) 12/26/2016   Noted on CT  . Arthritis   . BPH (benign prostatic hyperplasia)   . Chronic constipation    takes magnesium  . Duodenal ulcer   . Dyspnea 12/17/2017  . ED (erectile dysfunction)   . Foley catheter in place    history of no longer in place  . GERD (gastroesophageal reflux  disease)   . Heart palpitations    Occ  . Hiatal hernia   . History of adenomatous polyp of colon    tubular adenoma's 2005  . History of Helicobacter pylori infection    2008  . Hypercholesteremia   . Hypertension   . MR (mitral regurgitation)    Mild, noted on ECHO  . Pre-diabetes   . Pulmonary nodule 12/26/2016   noted on CT, 5 x 3 mm nodular opacity right upper lobe  . Rosacea   . Schatzki's ring    last dilated 02/ 2016  . Urinary retention    history of  . Varicocele 02/25/2014   Small to moderate left varicocele    Past Surgical History:  Procedure Laterality Date  . BALLOON DILATION N/A 06/08/2014   Procedure: BALLOON DILATION;  Surgeon: Garlan Fair, MD;  Location: [facility];  Service: Endoscopy;  Laterality: N/A;  . COLONOSCOPY WITH PROPOFOL N/A 06/08/2014   Procedure: COLONOSCOPY WITH PROPOFOL;  Surgeon: Garlan Fair, MD;  Location: [facility];  Service: Endoscopy;  Laterality: N/A;  . ESOPHAGOGASTRODUODENOSCOPY N/A 06/08/2014   Procedure: ESOPHAGOGASTRODUODENOSCOPY (EGD);  Surgeon: Garlan Fair, MD;  Location: [facility];  Service: Endoscopy;  Laterality: N/A;  . ESOPHAGOGASTRODUODENOSCOPY (EGD) WITH ESOPHAGEAL DILATION N/A 12/31/2012   Procedure: ESOPHAGOGASTRODUODENOSCOPY (EGD) WITH ESOPHAGEAL DILATION;  Surgeon: Garlan Fair, MD;  Location: [facility];  Service: Endoscopy;  Laterality: N/A;  .  GREEN LIGHT LASER TURP (TRANSURETHRAL RESECTION OF PROSTATE N/A 12/28/2015   Procedure: GREEN LIGHT LASER TURP (TRANSURETHRAL RESECTION OF PROSTATE;  Surgeon: Festus Aloe, MD;  Location: [facility];  Service: Urology;  Laterality: N/A;     reports that he has never smoked. He has never used smokeless tobacco. He reports current alcohol use. He reports that he does not use drugs.  No Known Allergies  No family history on file.   Prior to Admission medications   Medication Sig Start Date End Date Taking? Authorizing Provider    acetaminophen (TYLENOL) 325 MG tablet Take 2 tablets (650 mg total) by mouth every 6 (six) hours. Take scheduled for the next 2-3 days, then as needed for pain 04/28/18   Clovis Riley, MD  benzonatate (TESSALON) 100 MG capsule Take 1 capsule (100 mg total) by mouth 3 (three) times daily as needed for cough. 03/31/19   Julianne Rice, MD  cephALEXin (KEFLEX) 500 MG capsule Take 1 capsule (500 mg total) by mouth 3 (three) times daily. 11/24/18   Horton, Barbette Hair, MD  chlordiazePOXIDE (LIBRIUM) 25 MG capsule Take 25 mg by mouth 4 (four) times daily as needed for withdrawal or anxiety. 11/13/18   [provider]  disulfiram (ANTABUSE) 250 MG tablet Take 250 mg by mouth 2 (two) times daily. 11/13/18   [provider]  docusate sodium (COLACE) 100 MG capsule Take 1 capsule (100 mg total) by mouth 2 (two) times daily. Ok to stop taking if having loose bowel movements 04/28/18   Clovis Riley, MD  doxepin (SINEQUAN) 25 MG capsule Take 25 mg by mouth at bedtime. 10/23/18   [provider]  escitalopram (LEXAPRO) 20 MG tablet Take 20 mg by mouth daily. 10/23/18   [provider]  FLUoxetine (PROZAC) 20 MG capsule Take 20 mg by mouth daily. 08/21/18   [provider]  gabapentin (NEURONTIN) 300 MG capsule Take 300-600 mg by mouth See admin instructions. Take one capsule twice daily and two capsules at bedtime. 10/23/18   [provider]  lisinopril-hydrochlorothiazide (PRINZIDE,ZESTORETIC) 20-12.5 MG per tablet Take 2 tablets by mouth daily.     [provider]  methocarbamol (ROBAXIN) 500 MG tablet Take 1 tablet (500 mg total) by mouth every 6 (six) hours as needed for muscle spasms. 04/28/18   Clovis Riley, MD  naltrexone (DEPADE) 50 MG tablet Take 50 mg by mouth daily. 11/15/18   [provider]  ondansetron (ZOFRAN ODT) 4 MG disintegrating tablet Take 1 tablet (4 mg total) by mouth every 8 (eight) hours as needed for nausea or  vomiting. 04/28/18   Clovis Riley, MD  ondansetron (ZOFRAN) 4 MG tablet Take 4 mg by mouth 4 (four) times daily as needed for nausea/vomiting. 07/25/18   [provider]  traZODone (DESYREL) 100 MG tablet Take 100 mg by mouth at bedtime as needed for sleep.    [provider]    Physical Exam: Vitals:   06/21/19 1739 06/21/19 2121 06/21/19 2140 06/22/19 0002  BP: (!) 117/96 115/72 121/74 92/62  Pulse: 94 91 (!) 109 (!) 107  Resp:  20 20 17   Temp:  98.6 F (37 C) 97.8 F (36.6 C) 99.2 F (37.3 C)  TempSrc:  Oral Oral   SpO2:  94% 96% 95%  Weight:  80.3 kg    Height:  5\' 11"  (1.803 m)        Constitutional: Very anxious, tremulous, no acute distress Vitals:   06/21/19  1739 06/21/19 2121 06/21/19 2140 06/22/19 0002  BP: (!) 117/96 115/72 121/74 92/62  Pulse: 94 91 (!) 109 (!) 107  Resp:  20 20 17   Temp:  98.6 F (37 C) 97.8 F (36.6 C) 99.2 F (37.3 C)  TempSrc:  Oral Oral   SpO2:  94% 96% 95%  Weight:  80.3 kg    Height:  5\' 11"  (1.803 m)     Eyes: PERRL, lids and conjunctivae normal ENMT: Mucous membranes are moist. Posterior pharynx clear of any exudate or lesions.Normal dentition.  Neck: normal, supple, no masses, no thyromegaly Respiratory: clear to auscultation bilaterally, no wheezing, no crackles. Normal respiratory effort. No accessory muscle use.  Cardiovascular: Sinus tachycardia, no murmurs / rubs / gallops. No extremity edema. 2+ pedal pulses. No carotid bruits.  Abdomen: no tenderness, no masses palpated. No hepatosplenomegaly. Bowel sounds positive.  Musculoskeletal: no clubbing / cyanosis. No joint deformity upper and lower extremities. Good ROM, no contractures. Normal muscle tone.  Skin: no rashes, lesions, ulcers. No induration Neurologic: CN 2-12 grossly intact. Sensation intact, DTR normal. Strength 5/5 in all 4.  Psychiatric: Fully awake alert oriented x3 but tremulous and very anxious    Labs on Admission: I have personally  reviewed following labs and imaging studies  CBC: Recent Labs  Lab 06/21/19 1800  WBC 4.9  NEUTROABS 3.5  HGB 11.9*  HCT 35.4*  MCV 95.2  PLT [id number]*   Basic Metabolic Panel: Recent Labs  Lab 06/21/19 1800 06/21/19 2247  NA 140 141  K 4.1 4.4  CL 101 106  CO2 23 25  GLUCOSE 150* 109*  BUN 29* 24*  CREATININE 2.23* 1.94*  CALCIUM 8.0* 7.4*  MG  --  1.7  PHOS  --  3.6   GFR: Estimated Creatinine Clearance: 39.4 mL/min (A) (by C-G formula based on SCr of 1.94 mg/dL (H)). Liver Function Tests: Recent Labs  Lab 06/21/19 1800 06/21/19 2247  AST 82* 72*  ALT 67* 60*  ALKPHOS 103 103  BILITOT 0.7 0.6  PROT 6.4* 5.7*  ALBUMIN 3.5 3.1*   No results for input(s): LIPASE, AMYLASE in the last 168 hours. No results for input(s): AMMONIA in the last 168 hours. Coagulation Profile: No results for input(s): INR, PROTIME in the last 168 hours. Cardiac Enzymes: Recent Labs  Lab 06/21/19 1926  CKTOTAL 88   BNP (last 3 results) No results for input(s): PROBNP in the last 8760 hours. HbA1C: No results for input(s): HGBA1C in the last 72 hours. CBG: No results for input(s): GLUCAP in the last 168 hours. Lipid Profile: No results for input(s): CHOL, HDL, LDLCALC, TRIG, CHOLHDL, LDLDIRECT in the last 72 hours. Thyroid Function Tests: No results for input(s): TSH, T4TOTAL, FREET4, T3FREE, THYROIDAB in the last 72 hours. Anemia Panel: No results for input(s): VITAMINB12, FOLATE, FERRITIN, TIBC, IRON, RETICCTPCT in the last 72 hours. Urine analysis:    Component Value Date/Time   COLORURINE YELLOW 11/24/2018 0445   APPEARANCEUR CLOUDY (A) 11/24/2018 0445   LABSPEC 1.014 11/24/2018 0445   PHURINE 5.0 11/24/2018 0445   GLUCOSEU NEGATIVE 11/24/2018 0445   HGBUR SMALL (A) 11/24/2018 0445   BILIRUBINUR NEGATIVE 11/24/2018 [place] 11/24/2018 0445   PROTEINUR 30 (A) 11/24/2018 0445   NITRITE NEGATIVE 11/24/2018 0445   LEUKOCYTESUR LARGE (A) 11/24/2018 0445    Sepsis Labs: @LABRCNTIP (procalcitonin:4,lacticidven:4) ) Recent Results (from the past 240 hour(s))  Respiratory Panel by RT PCR (Flu A&B, Covid) - Nasopharyngeal Swab     Status:  None   Collection Time: 06/21/19  8:49 PM   Specimen: Nasopharyngeal Swab  Result Value Ref Range Status   SARS Coronavirus 2 by RT PCR NEGATIVE NEGATIVE Final    Comment: (NOTE) SARS-CoV-2 target nucleic acids are NOT DETECTED. The SARS-CoV-2 RNA is generally detectable in upper respiratoy specimens during the acute phase of infection. The lowest concentration of SARS-CoV-2 viral copies this assay can detect is 131 copies/mL. A negative result does not preclude SARS-Cov-2 infection and should not be used as the sole basis for treatment or other patient management decisions. A negative result may occur with  improper specimen collection/handling, submission of specimen other than nasopharyngeal swab, presence of viral mutation(s) within the areas targeted by this assay, and inadequate number of viral copies (<131 copies/mL). A negative result must be combined with clinical observations, patient history, and epidemiological information. The expected result is Negative. Fact Sheet for Patients:  PinkCheek.be Fact Sheet for Healthcare Providers:  GravelBags.it This test is not yet ap proved or cleared by the [place] FDA and  has been authorized for detection and/or diagnosis of SARS-CoV-2 by FDA under an Emergency Use Authorization (EUA). This EUA will remain  in effect (meaning this test can be used) for the duration of the COVID-19 declaration under Section 564(b)(1) of the Act, 21 U.S.C. section 360bbb-3(b)(1), unless the authorization is terminated or revoked sooner.    Influenza A by PCR NEGATIVE NEGATIVE Final   Influenza B by PCR NEGATIVE NEGATIVE Final    Comment: (NOTE) The Xpert Xpress SARS-CoV-2/FLU/RSV assay is intended as an  aid in  the diagnosis of influenza from Nasopharyngeal swab specimens and  should not be used as a sole basis for treatment. Nasal washings and  aspirates are unacceptable for Xpert Xpress SARS-CoV-2/FLU/RSV  testing. Fact Sheet for Patients: PinkCheek.be Fact Sheet for Healthcare Providers: GravelBags.it This test is not yet approved or cleared by the [place] FDA and  has been authorized for detection and/or diagnosis of SARS-CoV-2 by  FDA under an Emergency Use Authorization (EUA). This EUA will remain  in effect (meaning this test can be used) for the duration of the  Covid-19 declaration under Section 564(b)(1) of the Act, 21  U.S.C. section 360bbb-3(b)(1), unless the authorization is  terminated or revoked. Performed at [facility], [place] [address] [place], [place] [address]      Radiological Exams on Admission: CT Head Wo Contrast  Result Date: 06/21/2019 CLINICAL DATA:  69 year old male with altered mental status and ataxia. EXAM: CT HEAD WITHOUT CONTRAST TECHNIQUE: Contiguous axial images were obtained from the base of the skull through the vertex without intravenous contrast. COMPARISON:  11/23/2018 CT FINDINGS: Brain: No evidence of acute infarction, hemorrhage, hydrocephalus, extra-axial collection or mass lesion/mass effect. Atrophy and mild chronic small-vessel white matter ischemic changes again noted. Vascular: Carotid and vertebral atherosclerotic calcifications are noted. Skull: Normal. Negative for fracture or focal lesion. Sinuses/Orbits: No acute finding. Other: None. IMPRESSION: 1. No evidence of acute intracranial abnormality. 2. Atrophy and chronic small-vessel white matter ischemic changes. Electronically Signed   By: Margarette Canada M.D.   On: 06/21/2019 18:20    EKG: Independently reviewed.  Normal sinus rhythm with a rate of 95.  No significant ST changes  Assessment/Plan Principal  Problem:   Alcohol intoxication ([place]) Active Problems:   GERD (gastroesophageal reflux disease)   BPH (benign prostatic hyperplasia)   Essential hypertension     #1 alcohol intoxication: Patient also has early signs of withdrawal.  We will admit the patient for evaluation.  CIWA protocol once he starts withdrawing.  Thiamine and folic acid.  Counseling to be provided.  #2 GERD: Not on PPIs at home but will continue here.  #3 essential hypertension: Controlled blood pressure on home regimen of lisinopril hydrochlorothiazide.  Continue monitor.  #4 hyperlipidemia: Continue with Lipitor.  #4 history of BPH: Not on any medication.   DVT prophylaxis: Lovenox Code Status: Full code Family Communication: Discussed with patient fully Disposition Plan: Home Consults called: None Admission status: Inpatient  Severity of Illness: The appropriate patient status for this patient is INPATIENT. Inpatient status is judged to be reasonable and necessary in order to provide the required intensity of service to ensure the patient's safety. The patient's presenting symptoms, physical exam findings, and initial radiographic and laboratory data in the context of their chronic comorbidities is felt to place them at high risk for further clinical deterioration. Furthermore, it is not anticipated that the patient will be medically stable for discharge from the hospital within 2 midnights of admission. The following factors support the patient status of inpatient.   " The patient's presenting symptoms include tremors and alcohol abuse. " The worrisome physical exam findings include tremors. " The initial radiographic and laboratory data are worrisome because of alcohol level was 373. " The chronic co-morbidities include alcoholism.   * I certify that at the point of admission it is my clinical judgment that the patient will require inpatient hospital care spanning beyond 2 midnights from the point of  admission due to high intensity of service, high risk for further deterioration and high frequency of surveillance required.Barbette Merino MD [facility] Pager [phone]  If 7PM-7AM, please contact night-coverage [email or web address] Password [email or web address]  06/22/2019, 1:08 AM

## 2019-06-21 NOTE — ED Notes (Signed)
Gave patient food to eat and something to drink.

## 2019-06-21 NOTE — ED Notes (Signed)
ED TO INPATIENT HANDOFF REPORT  ED Nurse Name and Phone #: Fredonia Highland [phone]  S Name/Age/Gender Ruben [name] 69 y.o. male Room/Bed: WOTF/NONE  Code Status   Code Status: Prior  Home/SNF/Other Home Patient oriented to: self, place, time and situation Is this baseline? Yes   Triage Complete: Triage complete  Chief Complaint Alcohol intoxication [facility]) [F10.929]  Triage Note Pt to ED by [facility] with c/o of ETOH intoxication. [facility] was originally called out for "stroke" because he drank too much and passed out, woke up on floor. Pt states he relapsed last night and has consumed 2 pints of liquor. Pt was given 500 cc Patient states "I need detox" 115/80 BP      Allergies No Known Allergies  Level of Care/Admitting Diagnosis ED Disposition    ED Disposition Condition Comment   Admit  Hospital Area: [facility] [[id number]]  Level of Care: Telemetry [5]  Admit to tele based on following criteria: Complex arrhythmia (Bradycardia/Tachycardia)  May admit patient to [facility] or [facility] if equivalent level of care is available:: Yes  Covid Evaluation: Asymptomatic Screening Protocol (No Symptoms)  Diagnosis: Alcohol intoxication ([place]) [id number]  Admitting Physician: Elwyn Reach [[id number]]  Attending Physician: Elwyn Reach [[id number]]  Estimated length of stay: past midnight tomorrow  Certification:: I certify this patient will need inpatient services for at least 2 midnights       B Medical/Surgery History Past Medical History:  Diagnosis Date  . Alcoholism ([place])   . Aortic atherosclerosis ([place]) 12/26/2016   Noted on CT  . Arthritis   . BPH (benign prostatic hyperplasia)   . Chronic constipation    takes magnesium  . Duodenal ulcer   . Dyspnea 12/17/2017  . ED (erectile dysfunction)   . Foley catheter in place    history of no longer in place  . GERD (gastroesophageal reflux disease)   . Heart palpitations    Occ  . Hiatal hernia   .  History of adenomatous polyp of colon    tubular adenoma's 2005  . History of Helicobacter pylori infection    2008  . Hypercholesteremia   . Hypertension   . MR (mitral regurgitation)    Mild, noted on ECHO  . Pre-diabetes   . Pulmonary nodule 12/26/2016   noted on CT, 5 x 3 mm nodular opacity right upper lobe  . Rosacea   . Schatzki's ring    last dilated 02/ 2016  . Urinary retention    history of  . Varicocele 02/25/2014   Small to moderate left varicocele   Past Surgical History:  Procedure Laterality Date  . BALLOON DILATION N/A 06/08/2014   Procedure: BALLOON DILATION;  Surgeon: Garlan Fair, MD;  Location[facility];  Service: Endoscopy;  Laterality: N/A;  . COLONOSCOPY WITH PROPOFOL N/A 06/08/2014   Procedure: COLONOSCOPY WITH PROPOFOL;  Surgeon: Garlan Fair, MD;  Location: [facility];  Service: Endoscopy;  Laterality: N/A;  . ESOPHAGOGASTRODUODENOSCOPY N/A 06/08/2014   Procedure: ESOPHAGOGASTRODUODENOSCOPY (EGD);  Surgeon: Garlan Fair, MD;  Location: [facility];  Service: Endoscopy;  Laterality: N/A;  . ESOPHAGOGASTRODUODENOSCOPY (EGD) WITH ESOPHAGEAL DILATION N/A 12/31/2012   Procedure: ESOPHAGOGASTRODUODENOSCOPY (EGD) WITH ESOPHAGEAL DILATION;  Surgeon: Garlan Fair, MD;  Location: [facility];  Service: Endoscopy;  Laterality: N/A;  . GREEN LIGHT LASER TURP (TRANSURETHRAL RESECTION OF PROSTATE N/A 12/28/2015   Procedure: GREEN LIGHT LASER TURP (TRANSURETHRAL RESECTION OF PROSTATE;  Surgeon: Festus Aloe, MD;  Location: [facility];  Service: Urology;  Laterality: N/A;     A IV Location/Drains/Wounds Patient Lines/Drains/Airways Status   Active Line/Drains/Airways    Name:   Placement date:   Placement time:   Site:   Days:   Peripheral IV 06/21/19 Anterior;Right Hand   06/21/19    1730    Hand   less than 1          Intake/Output Last 24 hours  Intake/Output Summary (Last 24 hours) at 06/21/2019 2128 Last data filed at  06/21/2019 2118 Gross per 24 hour  Intake 2500 ml  Output --  Net 2500 ml    Labs/Imaging Results for orders placed or performed during the hospital encounter of 06/21/19 (from the past 48 hour(s))  Comprehensive metabolic panel     Status: Abnormal   Collection Time: 06/21/19  6:00 PM  Result Value Ref Range   Sodium 140 135 - 145 mmol/L   Potassium 4.1 3.5 - 5.1 mmol/L   Chloride 101 98 - 111 mmol/L   CO2 23 22 - 32 mmol/L   Glucose, Bld 150 (H) 70 - 99 mg/dL    Comment: Glucose reference range applies only to samples taken after fasting for at least 8 hours.   BUN 29 (H) 8 - 23 mg/dL   Creatinine, Ser 2.23 (H) 0.61 - 1.24 mg/dL   Calcium 8.0 (L) 8.9 - 10.3 mg/dL   Total Protein 6.4 (L) 6.5 - 8.1 g/dL   Albumin 3.5 3.5 - 5.0 g/dL   AST 82 (H) 15 - 41 U/L   ALT 67 (H) 0 - 44 U/L   Alkaline Phosphatase 103 38 - 126 U/L   Total Bilirubin 0.7 0.3 - 1.2 mg/dL   GFR calc non Af Amer 29 (L) >60 mL/min   GFR calc Af Amer 34 (L) >60 mL/min   Anion gap 16 (H) 5 - 15    Comment: Performed at [facility], [place] [address], [place], [place] [address]  Ethanol     Status: Abnormal   Collection Time: 06/21/19  6:00 PM  Result Value Ref Range   Alcohol, Ethyl (B) 372 (HH) <10 mg/dL    Comment: CRITICAL RESULT CALLED TO, READ BACK BY AND VERIFIED WITH: BOLLOCK, A RN 1925 06/21/19 JM (NOTE) Lowest detectable limit for serum alcohol is 10 mg/dL. For medical purposes only. Performed at [facility], [place] [address] [place],  [address]   CBC with Diff     Status: Abnormal   Collection Time: 06/21/19  6:00 PM  Result Value Ref Range   WBC 4.9 4.0 - 10.5 K/uL   RBC 3.72 (L) 4.22 - 5.81 MIL/uL   Hemoglobin 11.9 (L) 13.0 - 17.0 g/dL   HCT 35.4 (L) 39.0 - 52.0 %   MCV 95.2 80.0 - 100.0 fL   MCH 32.0 26.0 - 34.0 pg   MCHC 33.6 30.0 - 36.0 g/dL   RDW 15.8 (H) 11.5 - 15.5 %   Platelets 102 (L) 150 - 400 K/uL    Comment: REPEATED TO  VERIFY PLATELET COUNT CONFIRMED BY SMEAR Immature Platelet Fraction may be clinically indicated, consider ordering this additional test [id number]    nRBC 0.0 0.0 - 0.2 %   Neutrophils Relative % 73 %   Neutro Abs 3.5 1.7 - 7.7 K/uL   Lymphocytes Relative 15 %   Lymphs Abs 0.8 0.7 - 4.0 K/uL   Monocytes Relative 9 %   Monocytes Absolute 0.4 0.1 - 1.0 K/uL  Eosinophils Relative 1 %   Eosinophils Absolute 0.1 0.0 - 0.5 K/uL   Basophils Relative 2 %   Basophils Absolute 0.1 0.0 - 0.1 K/uL   Immature Granulocytes 0 %   Abs Immature Granulocytes 0.01 0.00 - 0.07 K/uL    Comment: Performed at [facility], [place] [address], [place], [place] [address]  CK     Status: None   Collection Time: 06/21/19  7:26 PM  Result Value Ref Range   Total CK 88 49 - 397 U/L    Comment: Performed at [facility], [place] [address] [place], [place] [address]   CT Head Wo Contrast  Result Date: 06/21/2019 CLINICAL DATA:  69 year old male with altered mental status and ataxia. EXAM: CT HEAD WITHOUT CONTRAST TECHNIQUE: Contiguous axial images were obtained from the base of the skull through the vertex without intravenous contrast. COMPARISON:  11/23/2018 CT FINDINGS: Brain: No evidence of acute infarction, hemorrhage, hydrocephalus, extra-axial collection or mass lesion/mass effect. Atrophy and mild chronic small-vessel white matter ischemic changes again noted. Vascular: Carotid and vertebral atherosclerotic calcifications are noted. Skull: Normal. Negative for fracture or focal lesion. Sinuses/Orbits: No acute finding. Other: None. IMPRESSION: 1. No evidence of acute intracranial abnormality. 2. Atrophy and chronic small-vessel white matter ischemic changes. Electronically Signed   By: Margarette Canada M.D.   On: 06/21/2019 18:20    Pending Labs Unresulted Labs (From admission, onward)    Start     Ordered   06/21/19 2048  Respiratory Panel by RT PCR (Flu A&B, Covid) -  Nasopharyngeal Swab  (Tier 2 Respiratory Panel by RT PCR (Flu A&B, Covid) (TAT 2 hrs))  Once,   STAT    Question Answer Comment  Is this test for diagnosis or screening Screening   Symptomatic for COVID-19 as defined by CDC No   Hospitalized for COVID-19 No   Admitted to ICU for COVID-19 No   Previously tested for COVID-19 Yes   Resident in a congregate (group) care setting No   Employed in healthcare setting No      06/21/19 2047   06/21/19 1800  Urine rapid drug screen (hosp performed)  ONCE - STAT,   STAT     06/21/19 1759   Signed and Held  HIV Antibody (routine testing w rflx)  (HIV Antibody (Routine testing w reflex) panel)  Once,   R     Signed and Held   Signed and Held  CBC  (enoxaparin (LOVENOX)    CrCl >/= 30 ml/min)  Once,   R    Comments: Baseline for enoxaparin therapy IF NOT ALREADY DRAWN.  Notify MD if PLT < 100 K.    Signed and Held   Signed and Held  Creatinine, serum  (enoxaparin (LOVENOX)    CrCl >/= 30 ml/min)  Once,   R    Comments: Baseline for enoxaparin therapy IF NOT ALREADY DRAWN.    Signed and Held   Signed and Held  Creatinine, serum  (enoxaparin (LOVENOX)    CrCl >/= 30 ml/min)  Weekly,   R    Comments: while on enoxaparin therapy    Signed and Held   Signed and Held  Comprehensive metabolic panel  Tomorrow morning,   R     Signed and Held   Signed and Held  CBC  Tomorrow morning,   R     Signed and Held   Signed and Held  Comprehensive metabolic panel  Once,   R  Signed and Held   Signed and Held  Magnesium  Once,   R     Signed and Held   Signed and Held  Phosphorus  Once,   R     Signed and Held   Signed and Held  CBC  Once,   R     Signed and Held          Vitals/Pain Today's Vitals   06/21/19 1736 06/21/19 1738 06/21/19 1739 06/21/19 2121  BP:   (!) 117/96 115/72  Pulse:   94 91  Resp:    20  Temp:    98.6 F (37 C)  TempSrc:    Oral  SpO2:    94%  Weight:    80.3 kg  Height:    5\' 11"  (1.803 m)  PainSc: 0-No pain 0-No pain   0-No pain    Isolation Precautions No active isolations  Medications Medications  sodium chloride 0.9 % bolus 1,000 mL (0 mLs Intravenous Stopped 06/21/19 2118)    Followed by  0.9 %  sodium chloride infusion (0 mLs Intravenous Stopped 06/21/19 2007)  thiamine (B-1) injection 100 mg (100 mg Intravenous Given 06/21/19 1838)  LORazepam (ATIVAN) injection 1 mg (1 mg Intravenous Given 06/21/19 2024)    Mobility walks Moderate fall risk   Focused Assessments Gen med   R Recommendations: See Admitting Provider Note  Report given to: [facility] RN  Additional Notes:

## 2019-06-22 ENCOUNTER — Encounter (HOSPITAL_COMMUNITY): Payer: Self-pay | Admitting: Internal Medicine

## 2019-06-22 DIAGNOSIS — K219 Gastro-esophageal reflux disease without esophagitis: Secondary | ICD-10-CM | POA: Diagnosis present

## 2019-06-22 DIAGNOSIS — I10 Essential (primary) hypertension: Secondary | ICD-10-CM | POA: Diagnosis present

## 2019-06-22 DIAGNOSIS — N400 Enlarged prostate without lower urinary tract symptoms: Secondary | ICD-10-CM | POA: Diagnosis present

## 2019-06-22 DIAGNOSIS — N179 Acute kidney failure, unspecified: Secondary | ICD-10-CM

## 2019-06-22 LAB — CBC
HCT: 31.3 % — ABNORMAL LOW (ref 39.0–52.0)
Hemoglobin: 10.2 g/dL — ABNORMAL LOW (ref 13.0–17.0)
MCH: 31.6 pg (ref 26.0–34.0)
MCHC: 32.6 g/dL (ref 30.0–36.0)
MCV: 96.9 fL (ref 80.0–100.0)
Platelets: 75 10*3/uL — ABNORMAL LOW (ref 150–400)
RBC: 3.23 MIL/uL — ABNORMAL LOW (ref 4.22–5.81)
RDW: 15.9 % — ABNORMAL HIGH (ref 11.5–15.5)
WBC: 4.4 10*3/uL (ref 4.0–10.5)
nRBC: 0 % (ref 0.0–0.2)

## 2019-06-22 LAB — COMPREHENSIVE METABOLIC PANEL
ALT: 62 U/L — ABNORMAL HIGH (ref 0–44)
AST: 90 U/L — ABNORMAL HIGH (ref 15–41)
Albumin: 2.9 g/dL — ABNORMAL LOW (ref 3.5–5.0)
Alkaline Phosphatase: 99 U/L (ref 38–126)
Anion gap: 11 (ref 5–15)
BUN: 24 mg/dL — ABNORMAL HIGH (ref 8–23)
CO2: 24 mmol/L (ref 22–32)
Calcium: 7.7 mg/dL — ABNORMAL LOW (ref 8.9–10.3)
Chloride: 108 mmol/L (ref 98–111)
Creatinine, Ser: 1.69 mg/dL — ABNORMAL HIGH (ref 0.61–1.24)
GFR calc Af Amer: 48 mL/min — ABNORMAL LOW (ref >60)
GFR calc non Af Amer: 41 mL/min — ABNORMAL LOW (ref >60)
Glucose, Bld: 82 mg/dL (ref 70–99)
Potassium: 4.3 mmol/L (ref 3.5–5.1)
Sodium: 143 mmol/L (ref 135–145)
Total Bilirubin: 0.8 mg/dL (ref 0.3–1.2)
Total Protein: 5.4 g/dL — ABNORMAL LOW (ref 6.5–8.1)

## 2019-06-22 LAB — HIV ANTIBODY (ROUTINE TESTING W REFLEX): HIV Screen 4th Generation wRfx: NONREACTIVE

## 2019-06-22 MED ORDER — DOCUSATE SODIUM 100 MG PO CAPS: 100.0000 mg | ORAL_CAPSULE | Freq: Two times a day (BID) | ORAL | Status: DC | PRN

## 2019-06-22 MED ORDER — LISINOPRIL 20 MG PO TABS
40.0000 mg | ORAL_TABLET | Freq: Every day | ORAL | Status: DC
  Administered 2019-06-22 – 2019-06-27 (×6): 40 mg via ORAL
  Filled 2019-06-22 (×3): qty 2
  Filled 2019-06-22 (×2): qty 4

## 2019-06-22 MED ORDER — LISINOPRIL-HYDROCHLOROTHIAZIDE 20-12.5 MG PO TABS: 2.0000 | ORAL_TABLET | Freq: Every day | ORAL | Status: DC

## 2019-06-22 MED ORDER — ENOXAPARIN SODIUM 40 MG/0.4ML ~~LOC~~ SOLN
40.0000 mg | SUBCUTANEOUS | Status: DC
  Administered 2019-06-22 – 2019-06-23 (×2): 40 mg via SUBCUTANEOUS
  Filled 2019-06-22: qty 0.4

## 2019-06-22 MED ORDER — HYDROCHLOROTHIAZIDE 25 MG PO TABS
25.0000 mg | ORAL_TABLET | Freq: Every day | ORAL | Status: DC
  Administered 2019-06-22: 25 mg via ORAL
  Filled 2019-06-22: qty 1

## 2019-06-22 MED ORDER — MIRTAZAPINE 15 MG PO TABS
7.5000 mg | ORAL_TABLET | Freq: Every day | ORAL | Status: DC
  Administered 2019-06-22 – 2019-06-26 (×5): 7.5 mg via ORAL
  Filled 2019-06-22 (×5): qty 1

## 2019-06-22 MED ORDER — ATORVASTATIN CALCIUM 10 MG PO TABS
20.0000 mg | ORAL_TABLET | Freq: Every day | ORAL | Status: DC
  Administered 2019-06-22: 20 mg via ORAL
  Filled 2019-06-22: qty 2

## 2019-06-22 MED ORDER — SODIUM CHLORIDE 0.9 % IV SOLN
1000.0000 mL | INTRAVENOUS | Status: DC
  Administered 2019-06-22 – 2019-06-25 (×6): 1000 mL via INTRAVENOUS

## 2019-06-22 NOTE — Progress Notes (Signed)
PROGRESS NOTE    SALBADOR [name]  [id number] DOB: 01-08-1951 DOA: 06/21/2019 PCP: [name], MD    Brief Narrative:69 y.o. male with medical history significant of alcoholism, BPH, aortic sclerosis, COVID-19 infection in December 2020, osteoarthritis, GERD, hyperlipidemia, hypertension who has quit smoking and drinking in the past but recently went back to drinking.  Patient came to the ER intoxicated and wanting to quit.  He has been weak dizzy.  Patient said he woke up on the floor today.  He was scared that he may have had a stroke.  He only took 2 pints of liquor but feeling bad and tremulous.  He wants to be admitted for detoxification and fear of falling and hurting himself.  Patient has elevated alcohol level and starting to show signs of withdrawal.  He is being admitted for further work-up.  ED Course: Temperature 99.2 blood pressure 120/74 pulse 109 respiratory rate of 20 oxygen sats 94% on room air.  Sodium 141 potassium 4.4 chloride 106 CO2 25 glucose 109 BUN 24 creatinine 1.94.  Calcium is 7.4.  Albumin 3.1.  AST and ALT slightly elevated.  COVID-19 is negative.  Head CT without contrast negative and EKG showed normal sinus rhythm with a rate of 95.  Mild ST depressions but does not appear to be new.  Alcohol level is 372.  Patient being admitted with alcoholic intoxication.  Assessment & Plan:   Principal Problem:   Alcohol intoxication ([place]) Active Problems:   GERD (gastroesophageal reflux disease)   BPH (benign prostatic hyperplasia)   Essential hypertension    #1 alcohol intoxication/alcohol withdrawal patient very tremulous today awake and alert.  Wants to quit drinking however he has been unsuccessful to do it.  He reports he has all the resources and has been to [place] in the past.  He has no family here in [place].  Lives alone.  AST 90 from 72 ALT 62 Continue CIWA protocol Continue thiamine and folate.   #2 AKI secondary to dehydration and alcohol intake  creatinine 2.23 at the time of admission down to 1.69 today.  Continue IV fluids.  #3 history of essential hypertension on lisinopril and hydrochlorothiazide  #4 history of hyperlipidemia on Lipitor will hold since LFTs are high.  #5 history of BPH not on any medications at home.   Estimated body mass index is 24.69 kg/m as calculated from the following:   Height as of this encounter: 5\' 11"  (1.803 m).   Weight as of this encounter: 80.3 kg.  DVT prophylaxis: Lovenox  code Status: Full code Family Communication: None  disposition Plan: Patient came from home I am hoping that he can be discharged home once his alcohol withdrawal is controlled Barriers to discharge alcohol withdrawal needs supportive therapy and monitoring   Consultants:   None  Procedures: None Antimicrobials: None  Subjective: Patient is resting in bed he is awake tremulous able to carry on a conversation follow commands answers my questions appropriately reports that he has no appetite at home  Objective: Vitals:   06/21/19 2140 06/22/19 0002 06/22/19 0357 06/22/19 1223  BP: 121/74 92/62 113/74 131/81  Pulse: (!) 109 (!) 107 85 80  Resp: 20 17 20 20   Temp: 97.8 F (36.6 C) 99.2 F (37.3 C) 98 F (36.7 C) 98.2 F (36.8 C)  TempSrc: Oral  Oral Oral  SpO2: 96% 95% 94% 94%  Weight:      Height:        Intake/Output Summary (Last 24 hours)  at 06/22/2019 1307 Last data filed at 06/22/2019 1100 Gross per 24 hour  Intake 3230.9 ml  Output 500 ml  Net 2730.9 ml   Filed Weights   06/21/19 2121  Weight: 80.3 kg    Examination: Tremulous  General exam: Appears calm and comfortable  Respiratory system: Clear to auscultation. Respiratory effort normal. Cardiovascular system: S1 & S2 heard, RRR. No JVD, murmurs, rubs, gallops or clicks. No pedal edema. Gastrointestinal system: Abdomen is nondistended, soft and nontender. No organomegaly or masses felt. Normal bowel sounds heard. Central nervous  system: Alert and oriented. No focal neurological deficits. Extremities: Symmetric 5 x 5 power. Skin: No rashes, lesions or ulcers Psychiatry: Judgement and insight appear normal. Mood & affect appropriate.     Data Reviewed: I have personally reviewed following labs and imaging studies  CBC: Recent Labs  Lab 06/21/19 1800 06/22/19 0554  WBC 4.9 4.4  NEUTROABS 3.5  --   HGB 11.9* 10.2*  HCT 35.4* 31.3*  MCV 95.2 96.9  PLT 102* 75*   Basic Metabolic Panel: Recent Labs  Lab 06/21/19 1800 06/21/19 2247 06/22/19 0554  NA 140 141 143  K 4.1 4.4 4.3  CL 101 106 108  CO2 23 25 24   GLUCOSE 150* 109* 82  BUN 29* 24* 24*  CREATININE 2.23* 1.94* 1.69*  CALCIUM 8.0* 7.4* 7.7*  MG  --  1.7  --   PHOS  --  3.6  --    GFR: Estimated Creatinine Clearance: 45.2 mL/min (A) (by C-G formula based on SCr of 1.69 mg/dL (H)). Liver Function Tests: Recent Labs  Lab 06/21/19 1800 06/21/19 2247 06/22/19 0554  AST 82* 72* 90*  ALT 67* 60* 62*  ALKPHOS 103 103 99  BILITOT 0.7 0.6 0.8  PROT 6.4* 5.7* 5.4*  ALBUMIN 3.5 3.1* 2.9*   No results for input(s): LIPASE, AMYLASE in the last 168 hours. No results for input(s): AMMONIA in the last 168 hours. Coagulation Profile: No results for input(s): INR, PROTIME in the last 168 hours. Cardiac Enzymes: Recent Labs  Lab 06/21/19 1926  CKTOTAL 88   BNP (last 3 results) No results for input(s): PROBNP in the last 8760 hours. HbA1C: No results for input(s): HGBA1C in the last 72 hours. CBG: No results for input(s): GLUCAP in the last 168 hours. Lipid Profile: No results for input(s): CHOL, HDL, LDLCALC, TRIG, CHOLHDL, LDLDIRECT in the last 72 hours. Thyroid Function Tests: No results for input(s): TSH, T4TOTAL, FREET4, T3FREE, THYROIDAB in the last 72 hours. Anemia Panel: No results for input(s): VITAMINB12, FOLATE, FERRITIN, TIBC, IRON, RETICCTPCT in the last 72 hours. Sepsis Labs: No results for input(s): PROCALCITON,  LATICACIDVEN in the last 168 hours.  Recent Results (from the past 240 hour(s))  Respiratory Panel by RT PCR (Flu A&B, Covid) - Nasopharyngeal Swab     Status: None   Collection Time: 06/21/19  8:49 PM   Specimen: Nasopharyngeal Swab  Result Value Ref Range Status   SARS Coronavirus 2 by RT PCR NEGATIVE NEGATIVE Final    Comment: (NOTE) SARS-CoV-2 target nucleic acids are NOT DETECTED. The SARS-CoV-2 RNA is generally detectable in upper respiratoy specimens during the acute phase of infection. The lowest concentration of SARS-CoV-2 viral copies this assay can detect is 131 copies/mL. A negative result does not preclude SARS-Cov-2 infection and should not be used as the sole basis for treatment or other patient management decisions. A negative result may occur with  improper specimen collection/handling, submission of specimen other than nasopharyngeal swab,  presence of viral mutation(s) within the areas targeted by this assay, and inadequate number of viral copies (<131 copies/mL). A negative result must be combined with clinical observations, patient history, and epidemiological information. The expected result is Negative. Fact Sheet for Patients:  PinkCheek.be Fact Sheet for Healthcare Providers:  GravelBags.it This test is not yet ap proved or cleared by the [place] FDA and  has been authorized for detection and/or diagnosis of SARS-CoV-2 by FDA under an Emergency Use Authorization (EUA). This EUA will remain  in effect (meaning this test can be used) for the duration of the COVID-19 declaration under Section 564(b)(1) of the Act, 21 U.S.C. section 360bbb-3(b)(1), unless the authorization is terminated or revoked sooner.    Influenza A by PCR NEGATIVE NEGATIVE Final   Influenza B by PCR NEGATIVE NEGATIVE Final    Comment: (NOTE) The Xpert Xpress SARS-CoV-2/FLU/RSV assay is intended as an aid in  the diagnosis of  influenza from Nasopharyngeal swab specimens and  should not be used as a sole basis for treatment. Nasal washings and  aspirates are unacceptable for Xpert Xpress SARS-CoV-2/FLU/RSV  testing. Fact Sheet for Patients: PinkCheek.be Fact Sheet for Healthcare Providers: GravelBags.it This test is not yet approved or cleared by the [place] FDA and  has been authorized for detection and/or diagnosis of SARS-CoV-2 by  FDA under an Emergency Use Authorization (EUA). This EUA will remain  in effect (meaning this test can be used) for the duration of the  Covid-19 declaration under Section 564(b)(1) of the Act, 21  U.S.C. section 360bbb-3(b)(1), unless the authorization is  terminated or revoked. Performed at [facility], [place] [address] [place], [place] [address]          Radiology Studies: CT Head Wo Contrast  Result Date: 06/21/2019 CLINICAL DATA:  69 year old male with altered mental status and ataxia. EXAM: CT HEAD WITHOUT CONTRAST TECHNIQUE: Contiguous axial images were obtained from the base of the skull through the vertex without intravenous contrast. COMPARISON:  11/23/2018 CT FINDINGS: Brain: No evidence of acute infarction, hemorrhage, hydrocephalus, extra-axial collection or mass lesion/mass effect. Atrophy and mild chronic small-vessel white matter ischemic changes again noted. Vascular: Carotid and vertebral atherosclerotic calcifications are noted. Skull: Normal. Negative for fracture or focal lesion. Sinuses/Orbits: No acute finding. Other: None. IMPRESSION: 1. No evidence of acute intracranial abnormality. 2. Atrophy and chronic small-vessel white matter ischemic changes. Electronically Signed   By: Margarette Canada M.D.   On: 06/21/2019 18:20        Scheduled Meds: . atorvastatin  20 mg Oral Daily  . enoxaparin (LOVENOX) injection  40 mg Subcutaneous Q24H  . feeding supplement (ENSURE ENLIVE)   237 mL Oral BID BM  . folic acid  1 mg Oral Daily  . hydrochlorothiazide  25 mg Oral Daily  . lisinopril  40 mg Oral Daily  . multivitamin with minerals  1 tablet Oral Daily  . thiamine  100 mg Oral Daily   Or  . thiamine  100 mg Intravenous Daily   Continuous Infusions: . sodium chloride 1,000 mL (06/21/19 2234)     LOS: 1 day     Georgette Shell, MD  06/22/2019, 1:07 PM

## 2019-06-23 LAB — CBC
HCT: 32.3 % — ABNORMAL LOW (ref 39.0–52.0)
Hemoglobin: 10.4 g/dL — ABNORMAL LOW (ref 13.0–17.0)
MCH: 31.6 pg (ref 26.0–34.0)
MCHC: 32.2 g/dL (ref 30.0–36.0)
MCV: 98.2 fL (ref 80.0–100.0)
Platelets: 64 10*3/uL — ABNORMAL LOW (ref 150–400)
RBC: 3.29 MIL/uL — ABNORMAL LOW (ref 4.22–5.81)
RDW: 15.7 % — ABNORMAL HIGH (ref 11.5–15.5)
WBC: 4 10*3/uL (ref 4.0–10.5)
nRBC: 0 % (ref 0.0–0.2)

## 2019-06-23 LAB — COMPREHENSIVE METABOLIC PANEL
ALT: 52 U/L — ABNORMAL HIGH (ref 0–44)
AST: 58 U/L — ABNORMAL HIGH (ref 15–41)
Albumin: 3 g/dL — ABNORMAL LOW (ref 3.5–5.0)
Alkaline Phosphatase: 92 U/L (ref 38–126)
Anion gap: 10 (ref 5–15)
BUN: 23 mg/dL (ref 8–23)
CO2: 24 mmol/L (ref 22–32)
Calcium: 8 mg/dL — ABNORMAL LOW (ref 8.9–10.3)
Chloride: 104 mmol/L (ref 98–111)
Creatinine, Ser: 1.33 mg/dL — ABNORMAL HIGH (ref 0.61–1.24)
GFR calc Af Amer: >60 mL/min (ref >60)
GFR calc non Af Amer: 55 mL/min — ABNORMAL LOW (ref >60)
Glucose, Bld: 101 mg/dL — ABNORMAL HIGH (ref 70–99)
Potassium: 4.1 mmol/L (ref 3.5–5.1)
Sodium: 138 mmol/L (ref 135–145)
Total Bilirubin: 1 mg/dL (ref 0.3–1.2)
Total Protein: 5.3 g/dL — ABNORMAL LOW (ref 6.5–8.1)

## 2019-06-23 LAB — MRSA PCR SCREENING: MRSA by PCR: NEGATIVE

## 2019-06-23 LAB — MAGNESIUM: Magnesium: 1.1 mg/dL — ABNORMAL LOW (ref 1.7–2.4)

## 2019-06-23 LAB — AMMONIA: Ammonia: 39 umol/L — ABNORMAL HIGH (ref 9–35)

## 2019-06-23 LAB — GLUCOSE, CAPILLARY: Glucose-Capillary: 97 mg/dL (ref 70–99)

## 2019-06-23 MED ORDER — HALOPERIDOL LACTATE 5 MG/ML IJ SOLN: 5.0000 mg | Freq: Three times a day (TID) | INTRAMUSCULAR | Status: DC | PRN

## 2019-06-23 MED ORDER — METOPROLOL TARTRATE 25 MG PO TABS: 25.0000 mg | ORAL_TABLET | Freq: Two times a day (BID) | ORAL | Status: DC

## 2019-06-23 MED ORDER — PRO-STAT SUGAR FREE PO LIQD
30.0000 mL | Freq: Two times a day (BID) | ORAL | Status: DC
  Administered 2019-06-23 – 2019-06-26 (×5): 30 mL via ORAL
  Filled 2019-06-23 (×6): qty 30

## 2019-06-23 MED ORDER — CHLORHEXIDINE GLUCONATE CLOTH 2 % EX PADS
6.0000 | MEDICATED_PAD | Freq: Every day | CUTANEOUS | Status: DC
  Administered 2019-06-23 – 2019-06-25 (×3): 6 via TOPICAL

## 2019-06-23 MED ORDER — METOPROLOL TARTRATE 5 MG/5ML IV SOLN
INTRAVENOUS | Status: AC
  Administered 2019-06-23: 5 mg
  Filled 2019-06-23: qty 5

## 2019-06-23 MED ORDER — METOPROLOL TARTRATE 5 MG/5ML IV SOLN
5.0000 mg | Freq: Once | INTRAVENOUS | Status: AC
  Administered 2019-06-23: 5 mg via INTRAVENOUS
  Filled 2019-06-23: qty 5

## 2019-06-23 MED ORDER — HALOPERIDOL LACTATE 5 MG/ML IJ SOLN
INTRAMUSCULAR | Status: AC
  Administered 2019-06-23: 5 mg
  Filled 2019-06-23: qty 1

## 2019-06-23 MED ORDER — HALOPERIDOL 5 MG PO TABS
5.0000 mg | ORAL_TABLET | Freq: Three times a day (TID) | ORAL | Status: DC | PRN
  Filled 2019-06-23: qty 1

## 2019-06-23 MED ORDER — BOOST / RESOURCE BREEZE PO LIQD CUSTOM
1.0000 | Freq: Two times a day (BID) | ORAL | Status: DC
  Administered 2019-06-24 – 2019-06-27 (×5): 1 via ORAL

## 2019-06-23 NOTE — Progress Notes (Signed)
PROGRESS NOTE    Ruben [name]  [id number] DOB: Jun 26, 1950 DOA: 06/21/2019 PCP: [name], MD   Brief Narrative: 69 y.o.malewith medical history significant ofalcoholism, BPH, aortic sclerosis, COVID-19 infection in December 2020, osteoarthritis, GERD, hyperlipidemia, hypertension who has quit smoking and drinking in the past but recently went back to drinking. Patient came to the ER intoxicated and wanting to quit. He has been weak dizzy. Patient said he woke up on the floor today. He was scared that he may have had a stroke. He only took 2 pints of liquor but feeling bad and tremulous. He wants to be admitted for detoxification and fear of falling and hurting himself. Patient has elevated alcohol level and starting to show signs of withdrawal. He is being admitted for further work-up.  ED Course:Temperature 99.2 blood pressure 120/74 pulse 109 respiratory rate of 20 oxygen sats 94% on room air. Sodium 141 potassium 4.4 chloride 106 CO2 25 glucose 109 BUN 24 creatinine 1.94. Calcium is 7.4. Albumin 3.1. AST and ALT slightly elevated. COVID-19 is negative. Head CT without contrast negative and EKG showed normal sinus rhythm with a rate of 95. Mild ST depressions but does not appear to be new. Alcohol level is 372. Patient being admitted with alcoholic intoxication.  Assessment & Plan:   Principal Problem:   Alcohol intoxication ([place]) Active Problems:   GERD (gastroesophageal reflux disease)   BPH (benign prostatic hyperplasia)   Essential hypertension   AKI (acute kidney injury) ([place])   #1 alcohol intoxication/alcohol withdrawal -alcohol level 372-patient with tremors and very unsteady with PT.CIWA 12 received ativan. AST 90 from 72 ALT 62 Continue CIWA protocol Continue thiamine and folate.   #2 AKI secondary to dehydration and alcohol intake creatinine 2.23 at the time of admission down to 1.69 today.  Continue IV fluids.  #3 history of essential  hypertension -bp 145/81 on lisinopril .hold hctz.  #4 history of hyperlipidemia on Lipitor will hold since LFTs are high.  #5 history of BPH not on any medications at home    Estimated body mass index is 24.69 kg/m as calculated from the following:   Height as of this encounter: 5\' 11"  (1.803 m).   Weight as of this encounter: 80.3 kg.  DVT prophylaxis: Lovenox  code Status: Full code Family Communication: None  disposition Plan: Patient came from home I am hoping that he can be discharged home once his alcohol withdrawal is controlled Barriers to discharge-high CIWA  alcohol withdrawal needs supportive therapy and monitoring   Consultants:   None  Procedures: None Antimicrobials: None  Subjective:  Patient is resting in bed tremors in both hands Objective: Vitals:   06/22/19 1223 06/22/19 2038 06/23/19 0019 06/23/19 0555  BP: 131/81 123/86 121/83 (!) 145/81  Pulse: 80 64 67 (!) 57  Resp: 20 19 20 20   Temp: 98.2 F (36.8 C) 98.6 F (37 C) (!) 97.5 F (36.4 C) 98.6 F (37 C)  TempSrc: Oral  Oral Oral  SpO2: 94% 96% 90% 100%  Weight:      Height:        Intake/Output Summary (Last 24 hours) at 06/23/2019 1120 Last data filed at 06/23/2019 0624 Gross per 24 hour  Intake 778.75 ml  Output 1150 ml  Net -371.25 ml   Filed Weights   06/21/19 2121  Weight: 80.3 kg    Examination: Asterixis present General exam: Appears calm and comfortable  Respiratory system: Clear to auscultation. Respiratory effort normal. Cardiovascular system: S1 & S2 heard,  RRR. No JVD, murmurs, rubs, gallops or clicks. No pedal edema. Gastrointestinal system: Abdomen is nondistended, soft and nontender. No organomegaly or masses felt. Normal bowel sounds heard. Central nervous system: Alert and oriented. No focal neurological deficits. Extremities: Symmetric 5 x 5 power. Skin: No rashes, lesions or ulcers Psychiatry: Judgement and insight appear normal. Mood & affect appropriate.      Data Reviewed: I have personally reviewed following labs and imaging studies  CBC: Recent Labs  Lab 06/21/19 1800 06/22/19 0554  WBC 4.9 4.4  NEUTROABS 3.5  --   HGB 11.9* 10.2*  HCT 35.4* 31.3*  MCV 95.2 96.9  PLT 102* 75*   Basic Metabolic Panel: Recent Labs  Lab 06/21/19 1800 06/21/19 2247 06/22/19 0554  NA 140 141 143  K 4.1 4.4 4.3  CL 101 106 108  CO2 23 25 24   GLUCOSE 150* 109* 82  BUN 29* 24* 24*  CREATININE 2.23* 1.94* 1.69*  CALCIUM 8.0* 7.4* 7.7*  MG  --  1.7  --   PHOS  --  3.6  --    GFR: Estimated Creatinine Clearance: 45.2 mL/min (A) (by C-G formula based on SCr of 1.69 mg/dL (H)). Liver Function Tests: Recent Labs  Lab 06/21/19 1800 06/21/19 2247 06/22/19 0554  AST 82* 72* 90*  ALT 67* 60* 62*  ALKPHOS 103 103 99  BILITOT 0.7 0.6 0.8  PROT 6.4* 5.7* 5.4*  ALBUMIN 3.5 3.1* 2.9*   No results for input(s): LIPASE, AMYLASE in the last 168 hours. No results for input(s): AMMONIA in the last 168 hours. Coagulation Profile: No results for input(s): INR, PROTIME in the last 168 hours. Cardiac Enzymes: Recent Labs  Lab 06/21/19 1926  CKTOTAL 88   BNP (last 3 results) No results for input(s): PROBNP in the last 8760 hours. HbA1C: No results for input(s): HGBA1C in the last 72 hours. CBG: No results for input(s): GLUCAP in the last 168 hours. Lipid Profile: No results for input(s): CHOL, HDL, LDLCALC, TRIG, CHOLHDL, LDLDIRECT in the last 72 hours. Thyroid Function Tests: No results for input(s): TSH, T4TOTAL, FREET4, T3FREE, THYROIDAB in the last 72 hours. Anemia Panel: No results for input(s): VITAMINB12, FOLATE, FERRITIN, TIBC, IRON, RETICCTPCT in the last 72 hours. Sepsis Labs: No results for input(s): PROCALCITON, LATICACIDVEN in the last 168 hours.  Recent Results (from the past 240 hour(s))  Respiratory Panel by RT PCR (Flu A&B, Covid) - Nasopharyngeal Swab     Status: None   Collection Time: 06/21/19  8:49 PM   Specimen:  Nasopharyngeal Swab  Result Value Ref Range Status   SARS Coronavirus 2 by RT PCR NEGATIVE NEGATIVE Final    Comment: (NOTE) SARS-CoV-2 target nucleic acids are NOT DETECTED. The SARS-CoV-2 RNA is generally detectable in upper respiratoy specimens during the acute phase of infection. The lowest concentration of SARS-CoV-2 viral copies this assay can detect is 131 copies/mL. A negative result does not preclude SARS-Cov-2 infection and should not be used as the sole basis for treatment or other patient management decisions. A negative result may occur with  improper specimen collection/handling, submission of specimen other than nasopharyngeal swab, presence of viral mutation(s) within the areas targeted by this assay, and inadequate number of viral copies (<131 copies/mL). A negative result must be combined with clinical observations, patient history, and epidemiological information. The expected result is Negative. Fact Sheet for Patients:  PinkCheek.be Fact Sheet for Healthcare Providers:  GravelBags.it This test is not yet ap proved or cleared by the [place]  FDA and  has been authorized for detection and/or diagnosis of SARS-CoV-2 by FDA under an Emergency Use Authorization (EUA). This EUA will remain  in effect (meaning this test can be used) for the duration of the COVID-19 declaration under Section 564(b)(1) of the Act, 21 U.S.C. section 360bbb-3(b)(1), unless the authorization is terminated or revoked sooner.    Influenza A by PCR NEGATIVE NEGATIVE Final   Influenza B by PCR NEGATIVE NEGATIVE Final    Comment: (NOTE) The Xpert Xpress SARS-CoV-2/FLU/RSV assay is intended as an aid in  the diagnosis of influenza from Nasopharyngeal swab specimens and  should not be used as a sole basis for treatment. Nasal washings and  aspirates are unacceptable for Xpert Xpress SARS-CoV-2/FLU/RSV  testing. Fact Sheet for  Patients: PinkCheek.be Fact Sheet for Healthcare Providers: GravelBags.it This test is not yet approved or cleared by the [place] FDA and  has been authorized for detection and/or diagnosis of SARS-CoV-2 by  FDA under an Emergency Use Authorization (EUA). This EUA will remain  in effect (meaning this test can be used) for the duration of the  Covid-19 declaration under Section 564(b)(1) of the Act, 21  U.S.C. section 360bbb-3(b)(1), unless the authorization is  terminated or revoked. Performed at [facility], [facility] [address], [place], [place] [address]          Radiology Studies: CT Head Wo Contrast  Result Date: 06/21/2019 CLINICAL DATA:  69 year old male with altered mental status and ataxia. EXAM: CT HEAD WITHOUT CONTRAST TECHNIQUE: Contiguous axial images were obtained from the base of the skull through the vertex without intravenous contrast. COMPARISON:  11/23/2018 CT FINDINGS: Brain: No evidence of acute infarction, hemorrhage, hydrocephalus, extra-axial collection or mass lesion/mass effect. Atrophy and mild chronic small-vessel white matter ischemic changes again noted. Vascular: Carotid and vertebral atherosclerotic calcifications are noted. Skull: Normal. Negative for fracture or focal lesion. Sinuses/Orbits: No acute finding. Other: None. IMPRESSION: 1. No evidence of acute intracranial abnormality. 2. Atrophy and chronic small-vessel white matter ischemic changes. Electronically Signed   By: Margarette Canada M.D.   On: 06/21/2019 18:20        Scheduled Meds: . enoxaparin (LOVENOX) injection  40 mg Subcutaneous Q24H  . feeding supplement (ENSURE ENLIVE)  237 mL Oral BID BM  . folic acid  1 mg Oral Daily  . lisinopril  40 mg Oral Daily  . mirtazapine  7.5 mg Oral QHS  . multivitamin with minerals  1 tablet Oral Daily  . thiamine  100 mg Oral Daily   Or  . thiamine  100 mg Intravenous Daily     Continuous Infusions: . sodium chloride 1,000 mL (06/23/19 0437)     LOS: 2 days     Georgette Shell, MD 06/23/2019, 11:20 AM

## 2019-06-23 NOTE — TOC Progression Note (Signed)
Transition of Care [facility]) - Progression Note    Patient Details  Name: Ruben [name] MRN: [id number] Date of Birth: [birth year]  Transition of Care [facility]) CM/SW Contact  Joaquin Courts, RN Phone Number: 06/23/2019, 3:34 PM  Clinical Narrative:    CM followed up with patient to discuss PT recommendations.  Patient lives alone and CM and patient discussed his options at discharge.  Patient does report that he feels very unsteady at this time and is open to exploring rehab options.  CM and patient discussed options for him to return home with HHPT, patient also questioned about Outpatient PT services and SNF for rehab.  CM discussed with patient the differences in these options.  Patient is open to exploring SNF for short-term rehab or if that is not an option or her improves prior to dc then home with [facility].  Patient placed a call to someone he says helps him with decision making but was unable to reach them by phone.  With permission CM faxed out FL2 for possible SNF search.     Expected Discharge Plan: [place] Barriers to Discharge: Continued Medical Work up  Expected Discharge Plan and Services Expected Discharge Plan: [place]   Discharge Planning Services: CM Consult   Living arrangements for the past 2 months: Single Family Home                 DME Arranged: N/A DME Agency: NA       HH Arranged: NA HH Agency: NA         Social Determinants of Health (SDOH) Interventions    Readmission Risk Interventions No flowsheet data found.

## 2019-06-23 NOTE — Progress Notes (Signed)
This note also relates to the following rows which could not be included: ECG Heart Rate - Cannot attach notes to unvalidated device data Resp - Cannot attach notes to unvalidated device data    06/23/19 1800  MEWS Score  Pulse Rate (!) 172  MEWS Score  MEWS Temp 0  MEWS Systolic 0  MEWS Pulse 3  MEWS RR 0  MEWS LOC 0  MEWS Score 3  MEWS Score Color Yellow  MEWS Assessment  Is this an acute change? Yes  Rapid Response Notification  Name of Rapid Response RN Notified Kalman Jewels, RN  Date Rapid Response Notified 06/23/19  Time Rapid Response Notified [id number]  Provider Notification  Provider Name/Title Kathreen Cosier  Date Provider Notified 06/23/19  Time Provider Notified 1818  Notification Type Page  Notification Reason Change in status  Response See new orders  Date of Provider Response 06/23/19  Time of Provider Response 1820    Patient had a change in status as well as CIWA scoring >20 leading to MD and Rapid response RN notification.  MD gave orders and patient transferred to step down.  Report given and patient transferred to room 1224.  Virginia Rochester, RN

## 2019-06-23 NOTE — NC FL2 (Signed)
[place] MEDICAID FL2 LEVEL OF CARE SCREENING TOOL     IDENTIFICATION  Patient Name: Ruben [name] Birthdate: 1951/02/09 Sex: male Admission Date (Current Location): 06/21/2019  [facility] and [place] Number:  Herbalist and Address:  [facility],  [place], [place]      Provider Number: [id number]  Attending Physician Name and Address:  Georgette Shell, MD  Relative Name and Phone Number:       Current Level of Care: Hospital Recommended Level of Care: [place] Prior Approval Number:    Date Approved/Denied:   PASRR Number: [id number]  Discharge Plan: SNF    Current Diagnoses: Patient Active Problem List   Diagnosis Date Noted  . GERD (gastroesophageal reflux disease) 06/22/2019  . BPH (benign prostatic hyperplasia) 06/22/2019  . Essential hypertension 06/22/2019  . AKI (acute kidney injury) ([place])   . Alcohol intoxication ([place]) 06/21/2019  . S/P Nissen fundoplication (without gastrostomy tube) procedure 04/26/2018    Orientation RESPIRATION BLADDER Height & Weight     Self, Time, Situation, Place  Normal Continent Weight: 80.3 kg Height:  5\' 11"  (180.3 cm)  BEHAVIORAL SYMPTOMS/MOOD NEUROLOGICAL BOWEL NUTRITION STATUS      Continent Diet  AMBULATORY STATUS COMMUNICATION OF NEEDS Skin   Extensive Assist Verbally Normal                       Personal Care Assistance Level of Assistance  Bathing, Dressing Bathing Assistance: Limited assistance   Dressing Assistance: Limited assistance     Functional Limitations Info             SPECIAL CARE FACTORS FREQUENCY  PT (By licensed PT), OT (By licensed OT)     PT Frequency: 5x weekly OT Frequency: 5x weekly            Contractures Contractures Info: Not present    Additional Factors Info  Code Status, Allergies Code Status Info: Full Allergies Info: NKDA           Current Medications (06/23/2019):  This is the current hospital  active medication list Current Facility-Administered Medications  Medication Dose Route Frequency Provider Last Rate Last Admin  . 0.9 %  sodium chloride infusion  1,000 mL Intravenous Continuous Georgette Shell, MD 75 mL/hr at 06/23/19 0437 1,000 mL at 06/23/19 0437  . docusate sodium (COLACE) capsule 100 mg  100 mg Oral BID PRN Gala Romney L, MD      . enoxaparin (LOVENOX) injection 40 mg  40 mg Subcutaneous Q24H Georgette Shell, MD   40 mg at 06/22/19 2129  . feeding supplement (ENSURE ENLIVE) (ENSURE ENLIVE) liquid 237 mL  237 mL Oral BID BM Garba, Mohammad L, MD      . folic acid (FOLVITE) tablet 1 mg  1 mg Oral Daily Elwyn Reach, MD   1 mg at 06/23/19 0948  . lisinopril (ZESTRIL) tablet 40 mg  40 mg Oral Daily Elwyn Reach, MD   40 mg at 06/23/19 0948  . LORazepam (ATIVAN) tablet 1-4 mg  1-4 mg Oral Q1H PRN Elwyn Reach, MD   1 mg at 06/23/19 0443   Or  . LORazepam (ATIVAN) injection 1-4 mg  1-4 mg Intravenous Q1H PRN Elwyn Reach, MD   2 mg at 06/23/19 1043  . mirtazapine (REMERON) tablet 7.5 mg  7.5 mg Oral QHS Georgette Shell, MD   7.5 mg at 06/22/19 2129  . multivitamin  with minerals tablet 1 tablet  1 tablet Oral Daily Elwyn Reach, MD   1 tablet at 06/23/19 0948  . ondansetron (ZOFRAN) tablet 4 mg  4 mg Oral Q6H PRN Elwyn Reach, MD       Or  . ondansetron (ZOFRAN) injection 4 mg  4 mg Intravenous Q6H PRN Gala Romney L, MD      . thiamine tablet 100 mg  100 mg Oral Daily Gala Romney L, MD   100 mg at 06/23/19 0948   Or  . thiamine (B-1) injection 100 mg  100 mg Intravenous Daily Elwyn Reach, MD         Discharge Medications: Please see discharge summary for a list of discharge medications.  Relevant Imaging Results:  Relevant Lab Results:   Additional Information SSN [id number]  Joaquin Courts, RN

## 2019-06-23 NOTE — TOC Initial Note (Signed)
Transition of Care [facility]) - Initial/Assessment Note    Patient Details  Name: Ruben [name] MRN: [id number] Date of Birth: 1950-09-08  Transition of Care (TOC) CM/SW Contact:    Joaquin Courts, RN Phone Number: 06/23/2019, 10:59 AM  Clinical Narrative:  CM spoke with patient at bedside. Patient reports he would like to quit drinking.  Patient has previously gone through a rehabilitation program that he feels was helpful and he stayed sober for 75 days.  Patient lives at home alone, but has an aunt and cousin in town he can stay with if he needs.  Patient also reports he has gone to [place] meetings in the past and went to an [employer] meeting that was held in person in an outdoor park shelter.  CM provided patient with ETOH resources including AA, outpatient, and inpatient treatment centers. No further needs identified at this time.                   Expected Discharge Plan: Home/Self Care Barriers to Discharge: Continued Medical Work up   Patient Goals and CMS Choice Patient states their goals for this hospitalization and ongoing recovery are:: to quit drinking      Expected Discharge Plan and Services Expected Discharge Plan: Home/Self Care   Discharge Planning Services: CM Consult   Living arrangements for the past 2 months: Single Family Home                 DME Arranged: N/A DME Agency: NA       HH Arranged: NA HH Agency: NA        Prior Living Arrangements/Services Living arrangements for the past 2 months: Single Family Home Lives with:: Self Patient language and need for interpreter reviewed:: Yes Do you feel safe going back to the place where you live?: Yes      Need for Family Participation in Patient Care: No (Comment) Care giver support system in place?: No (comment)   Criminal Activity/Legal Involvement Pertinent to Current Situation/Hospitalization: No - Comment as needed  Activities of Daily Living Home Assistive Devices/Equipment: Blood pressure cuff,  Eyeglasses, Scales ADL Screening (condition at time of admission) Patient's cognitive ability adequate to safely complete daily activities?: Yes Is the patient deaf or have difficulty hearing?: No Does the patient have difficulty seeing, even when wearing glasses/contacts?: No Does the patient have difficulty concentrating, remembering, or making decisions?: Yes Patient able to express need for assistance with ADLs?: Yes Does the patient have difficulty dressing or bathing?: Yes Independently performs ADLs?: Yes (appropriate for developmental age) Does the patient have difficulty walking or climbing stairs?: Yes Weakness of Legs: Both Weakness of Arms/Hands: None  Permission Sought/Granted                  Emotional Assessment Appearance:: Appears stated age Attitude/Demeanor/Rapport: Engaged Affect (typically observed): Accepting Orientation: : Oriented to Self, Oriented to Place, Oriented to  Time, Oriented to Situation   Psych Involvement: No (comment)  Admission diagnosis:  Alcohol intoxication ([place]) [F10.929] AKI (acute kidney injury) ([place]) 123456 Alcoholic intoxication with complication [facility]) [id number] Patient Active Problem List   Diagnosis Date Noted  . GERD (gastroesophageal reflux disease) 06/22/2019  . BPH (benign prostatic hyperplasia) 06/22/2019  . Essential hypertension 06/22/2019  . AKI (acute kidney injury) ([place])   . Alcohol intoxication ([place]) 06/21/2019  . S/P Nissen fundoplication (without gastrostomy tube) procedure 04/26/2018   PCP:  [name], MD Pharmacy:   [id number] - [place], [place] -  [address] [place] [address] Phone: [phone] Fax: [phone]     Social Determinants of Health (SDOH) Interventions    Readmission Risk Interventions No flowsheet data found.

## 2019-06-23 NOTE — Progress Notes (Signed)
Initial Nutrition Assessment  DOCUMENTATION CODES:   Not applicable  INTERVENTION:  - will order Boost Breeze BID, each supplement provides 250 kcal and 9 grams of protein. - will order 30 mL Prostat BID, each supplement provides 100 kcal and 15 grams of protein. - will d/c Ensure Enlive per patient preference.    NUTRITION DIAGNOSIS:   Increased nutrient needs related to acute illness as evidenced by estimated needs.  GOAL:   Patient will meet greater than or equal to 90% of their needs  MONITOR:   PO intake, Supplement acceptance, Labs, Weight trends  REASON FOR ASSESSMENT:   Malnutrition Screening Tool  ASSESSMENT:   69 y.o. male with medical history significant of alcoholism, BPH, aortic sclerosis, COVID-19 infection in 03/2019, osteoarthritis, GERD, hyperlipidemia, and HTN. He quit smoking and drinking in the past, but recently went back to drinking.  He presented to the ED intoxicated and wanting to quit. He has been weak and dizzy. Patient said he woke up on the floor and was scared that he may have had a stroke. He drank 2 pints of liquor and was feeling tremulous. He wants to be admitted for detoxification and fear of falling and hurting himself.  Patient consumed 100% of breakfast yesterday (252 kcal, 23 grams protein) and 100% of breakfast today (201 kcal, 21 grams protein). Patient reported good appetite at baseline, but that appetite has been decreased since December when he had COVID and then with resuming drinking alcohol. Meals are typically smaller portions and he typically eats breakfast and dinner, but skips lunch. He denies any chewing or swallowing issues at baseline and denies any nausea or abdominal pain/pressure with PO intakes.   Per chart review, weight on 3/13 was 177 lb and PTA the most recently documented weight was on 04/23/18 when he weighed 195 lb. This indicates 18 lb weight loss (9.2% body weight) in the past 14 months; not significant for time  frame.   Per notes: - alcohol intoxication/withdrawal--CIWA protocol - AKI 2/2 dehydration--IV fluid ordered   Labs reviewed; CBG: 97 mg/dl, BUN: 24 mg/dl, creatinine: 1.69 mg/dl, Ca: 7.7 mg/dl, LFTs elevated, GFR: 41 ml/min. Medications reviewed; 1 mg folvite/day, daily multivitamin with minerals, 100 mg oral thiamine/day.  IVF; NS @ 75 ml/hr.    NUTRITION - FOCUSED PHYSICAL EXAM:  completed; no muscle and no fat wasting.   Diet Order:   Diet Order            Diet Heart Room service appropriate? Yes; Fluid consistency: Thin  Diet effective now              EDUCATION NEEDS:   No education needs have been identified at this time  Skin:  Skin Assessment: Reviewed RN Assessment  Last BM:  3/14  Height:   Ht Readings from Last 1 Encounters:  06/21/19 5\' 11"  (1.803 m)    Weight:   Wt Readings from Last 1 Encounters:  06/21/19 80.3 kg    Ideal Body Weight:  78.2 kg  BMI:  Body mass index is 24.69 kg/m.  Estimated Nutritional Needs:   Kcal:  2010-2250 kcal  Protein:  100-115 grams  Fluid:  >/= 2.2 L/day     Jarome Matin, MS, RD, LDN, CNSC Inpatient Clinical Dietitian RD pager # available in [facility]  After hours/weekend pager # available in [facility]

## 2019-06-23 NOTE — Evaluation (Signed)
Physical Therapy Evaluation Patient Details Name: Ruben [name] MRN: [id number] DOB: April 28, 1950 Today's Date: 06/23/2019   History of Present Illness  69 yo male admitted with ETOH intoxication/withdrawal symptoms. Hx of ETOH abuse, COVID, OA  Clinical Impression  On eval, pt required Min guard-Min assist for mobility. He walked ~100 feet x 2. Pt presents with general weakness, decreased activity tolerance, and impaired gait and balance. He is at risk for falls when mobilizing. He demonstrates some mild confusion, impulsivity, and decreased safety awareness. Do not feel he is safe to d/c home on today, especially without arranging supervision/assistance in home. Will continue to follow and progress activity as tolerated.     Follow Up Recommendations Supervision/Assistance - 24 hour;Home health PT    Equipment Recommendations  (may need a RW-continuing to assess/practice)    Recommendations for Other Services       Precautions / Restrictions Precautions Precautions: Fall Restrictions Weight Bearing Restrictions: No      Mobility  Bed Mobility Overal bed mobility: Needs Assistance Bed Mobility: Supine to Sit     Supine to sit: Supervision;HOB elevated        Transfers Overall transfer level: Needs assistance   Transfers: Sit to/from Stand Sit to Stand: Min guard         General transfer comment: for safety. unsteady.  Ambulation/Gait Ambulation/Gait assistance: Min guard;Min assist Gait Distance (Feet): 100 Feet(x2) Assistive device: Rolling walker (2 wheeled);None Gait Pattern/deviations: Wide base of support;Decreased step length - right;Decreased step length - left     General Gait Details: Walked x 1 without device then x 1 with RW. Pt required intermittent assist to steady. Cues for slow controlled pace and longer, equal step lengths bilaterally-this did not improve with RW use.  Stairs            Wheelchair Mobility    Modified Rankin (Stroke  Patients Only)       Balance Overall balance assessment: Needs assistance         Standing balance support: No upper extremity supported Standing balance-Leahy Scale: Fair                               Pertinent Vitals/Pain Pain Assessment: No/denies pain    Home Living Family/patient expects to be discharged to:: Private residence Living Arrangements: Alone   Type of Home: House         Home Equipment: None      Prior Function Level of Independence: Independent               Hand Dominance        Extremity/Trunk Assessment   Upper Extremity Assessment Upper Extremity Assessment: Overall WFL for tasks assessed    Lower Extremity Assessment Lower Extremity Assessment: Generalized weakness    Cervical / Trunk Assessment Cervical / Trunk Assessment: Normal  Communication      Cognition Arousal/Alertness: Awake/alert Behavior During Therapy: WFL for tasks assessed/performed Overall Cognitive Status: Impaired/Different from baseline Area of Impairment: Problem solving;Safety/judgement                         Safety/Judgement: Decreased awareness of safety;Decreased awareness of deficits   Problem Solving: Requires verbal cues General Comments: impulsivity, mild confusion, poor safety awareness      General Comments      Exercises     Assessment/Plan    PT Assessment Patient needs continued PT services  PT Problem List Decreased balance;Decreased activity tolerance;Decreased knowledge of use of DME;Decreased safety awareness;Decreased mobility       PT Treatment Interventions DME instruction;Gait training;Therapeutic activities;Therapeutic exercise;Patient/family education;Balance training;Functional mobility training    PT Goals (Current goals can be found in the Care Plan section)  Acute Rehab PT Goals Patient Stated Goal: home PT Goal Formulation: With patient Time For Goal Achievement: 07/07/19 Potential to  Achieve Goals: Good    Frequency Min 3X/week   Barriers to discharge        Co-evaluation               AM-PAC PT "6 Clicks" Mobility  Outcome Measure Help needed turning from your back to your side while in a flat bed without using bedrails?: A Little Help needed moving from lying on your back to sitting on the side of a flat bed without using bedrails?: A Little Help needed moving to and from a bed to a chair (including a wheelchair)?: A Little Help needed standing up from a chair using your arms (e.g., wheelchair or bedside chair)?: A Little Help needed to walk in hospital room?: A Little Help needed climbing 3-5 steps with a railing? : A Little 6 Click Score: 18    End of Session Equipment Utilized During Treatment: Gait belt Activity Tolerance: Patient tolerated treatment well Patient left: in chair;with call bell/phone within reach;with chair alarm set   PT Visit Diagnosis: Unsteadiness on feet (R26.81);Difficulty in walking, not elsewhere classified (R26.2)    Time: [id number] PT Time Calculation (min) (ACUTE ONLY): 19 min   Charges:   PT Evaluation $PT Eval Low Complexity: 1 Low         Eldene Plocher P, PT Acute Rehabilitation

## 2019-06-23 NOTE — Progress Notes (Incomplete)
This note also relates to the following rows which could not be included: ECG Heart Rate - Cannot attach notes to unvalidated device data Resp - Cannot attach notes to unvalidated device data   

## 2019-06-24 ENCOUNTER — Encounter (HOSPITAL_COMMUNITY): Payer: Self-pay | Admitting: Internal Medicine

## 2019-06-24 LAB — PHOSPHORUS: Phosphorus: 1.9 mg/dL — ABNORMAL LOW (ref 2.5–4.6)

## 2019-06-24 LAB — MAGNESIUM: Magnesium: 1.2 mg/dL — ABNORMAL LOW (ref 1.7–2.4)

## 2019-06-24 LAB — COMPREHENSIVE METABOLIC PANEL
ALT: 46 U/L — ABNORMAL HIGH (ref 0–44)
AST: 46 U/L — ABNORMAL HIGH (ref 15–41)
Albumin: 3.2 g/dL — ABNORMAL LOW (ref 3.5–5.0)
Alkaline Phosphatase: 87 U/L (ref 38–126)
Anion gap: 12 (ref 5–15)
BUN: 18 mg/dL (ref 8–23)
CO2: 20 mmol/L — ABNORMAL LOW (ref 22–32)
Calcium: 7.8 mg/dL — ABNORMAL LOW (ref 8.9–10.3)
Chloride: 106 mmol/L (ref 98–111)
Creatinine, Ser: 1.26 mg/dL — ABNORMAL HIGH (ref 0.61–1.24)
GFR calc Af Amer: >60 mL/min (ref >60)
GFR calc non Af Amer: 59 mL/min — ABNORMAL LOW (ref >60)
Glucose, Bld: 93 mg/dL (ref 70–99)
Potassium: 3.4 mmol/L — ABNORMAL LOW (ref 3.5–5.1)
Sodium: 138 mmol/L (ref 135–145)
Total Bilirubin: 1.4 mg/dL — ABNORMAL HIGH (ref 0.3–1.2)
Total Protein: 5.7 g/dL — ABNORMAL LOW (ref 6.5–8.1)

## 2019-06-24 LAB — CBC
HCT: 32.1 % — ABNORMAL LOW (ref 39.0–52.0)
Hemoglobin: 10.3 g/dL — ABNORMAL LOW (ref 13.0–17.0)
MCH: 31.6 pg (ref 26.0–34.0)
MCHC: 32.1 g/dL (ref 30.0–36.0)
MCV: 98.5 fL (ref 80.0–100.0)
Platelets: 59 10*3/uL — ABNORMAL LOW (ref 150–400)
RBC: 3.26 MIL/uL — ABNORMAL LOW (ref 4.22–5.81)
RDW: 15.7 % — ABNORMAL HIGH (ref 11.5–15.5)
WBC: 5.3 10*3/uL (ref 4.0–10.5)
nRBC: 0 % (ref 0.0–0.2)

## 2019-06-24 MED ORDER — AMLODIPINE BESYLATE 5 MG PO TABS
5.0000 mg | ORAL_TABLET | Freq: Once | ORAL | Status: AC
  Administered 2019-06-24: 5 mg via ORAL

## 2019-06-24 MED ORDER — HYDRALAZINE HCL 20 MG/ML IJ SOLN
10.0000 mg | INTRAMUSCULAR | Status: DC | PRN
  Administered 2019-06-24: 10 mg via INTRAVENOUS
  Filled 2019-06-24: qty 1

## 2019-06-24 MED ORDER — POTASSIUM CHLORIDE 10 MEQ/100ML IV SOLN
INTRAVENOUS | Status: AC
  Filled 2019-06-24: qty 100

## 2019-06-24 MED ORDER — POTASSIUM CHLORIDE 10 MEQ/100ML IV SOLN
10.0000 meq | INTRAVENOUS | Status: AC
  Administered 2019-06-24 (×2): 10 meq via INTRAVENOUS
  Filled 2019-06-24: qty 100

## 2019-06-24 MED ORDER — HYDRALAZINE HCL 20 MG/ML IJ SOLN
10.0000 mg | Freq: Four times a day (QID) | INTRAMUSCULAR | Status: DC | PRN
  Administered 2019-06-24: 10 mg via INTRAVENOUS
  Filled 2019-06-24: qty 1

## 2019-06-24 MED ORDER — CHLORDIAZEPOXIDE HCL 25 MG PO CAPS
50.0000 mg | ORAL_CAPSULE | Freq: Three times a day (TID) | ORAL | Status: AC
  Administered 2019-06-24 – 2019-06-25 (×6): 50 mg via ORAL
  Filled 2019-06-24 (×6): qty 2

## 2019-06-24 MED ORDER — MAGNESIUM SULFATE 2 GM/50ML IV SOLN
2.0000 g | Freq: Once | INTRAVENOUS | Status: AC
  Administered 2019-06-24: 2 g via INTRAVENOUS

## 2019-06-24 MED ORDER — DEXMEDETOMIDINE HCL IN NACL 200 MCG/50ML IV SOLN
0.4000 ug/kg/h | INTRAVENOUS | Status: DC
  Administered 2019-06-24: 0.4 ug/kg/h via INTRAVENOUS
  Administered 2019-06-24 (×2): 0.6 ug/kg/h via INTRAVENOUS
  Administered 2019-06-24: 0.4 ug/kg/h via INTRAVENOUS
  Administered 2019-06-24 – 2019-06-25 (×2): 0.5 ug/kg/h via INTRAVENOUS
  Filled 2019-06-24 (×7): qty 50

## 2019-06-24 MED ORDER — AMLODIPINE BESYLATE 5 MG PO TABS
5.0000 mg | ORAL_TABLET | Freq: Every day | ORAL | Status: DC
  Administered 2019-06-24 – 2019-06-27 (×4): 5 mg via ORAL
  Filled 2019-06-24 (×5): qty 1

## 2019-06-24 NOTE — Progress Notes (Signed)
PT currently in bilateral non-violent wrist restraints, a waist belt restraint and has been receiving hourly Ativan as prescribed dependent on CIWA score. PT is still unable to relax and has remained constantly tense with fists balled up and has been kicking foot of bed. I am concerned that he will injure himself and medications have not proven to be helpful. Patient is pleasant when I speak to him and answers most questions appropriately. I have been frequently assessing him due to his agitation. I found out per patient that his POA is his first cousin [place]. I am going to have dayshift nurse reach out to her for an update on his current condition.

## 2019-06-24 NOTE — Progress Notes (Addendum)
PROGRESS NOTE    Ruben [name]  [id number] DOB: [birth year] DOA: 06/21/2019 PCP: [name], MD  Brief Narrative:  69 y.o.malewith medical history significant ofalcoholism, BPH, aortic sclerosis, COVID-19 infection in December 2020, osteoarthritis, GERD, hyperlipidemia, hypertension who has quit smoking and drinking in the past but recently went back to drinking. Patient came to the ER intoxicated and wanting to quit. He has been weak dizzy. Patient said he woke up on the floor today. He was scared that he may have had a stroke. He only took 2 pints of liquor but feeling bad and tremulous. He wants to be admitted for detoxification and fear of falling and hurting himself. Patient has elevated alcohol level and starting to show signs of withdrawal. He is being admitted for further work-up.  ED Course:Temperature 99.2 blood pressure 120/74 pulse 109 respiratory rate of 20 oxygen sats 94% on room air. Sodium 141 potassium 4.4 chloride 106 CO2 25 glucose 109 BUN 24 creatinine 1.94. Calcium is 7.4. Albumin 3.1. AST and ALT slightly elevated. COVID-19 is negative. Head CT without contrast negative and EKG showed normal sinus rhythm with a rate of 95. Mild ST depressions but does not appear to be new. Alcohol level is 372. Patient being admitted with alcoholic intoxication.  06/24/2019 patient was moved to stepdown unit as he became very tachycardic and hypotensive and agitated with tremors and increasing CIWA requiring more and more Ativan. He was pulling out lines  Overnight he received 23 mg of Ativan and 5 mg haldol without any changes and then was started on Precedex. Had restrains placed mittens pulled out by the patient   Assessment & Plan:   Principal Problem:   Alcohol intoxication ([place]) Active Problems:   GERD (gastroesophageal reflux disease)   BPH (benign prostatic hyperplasia)   Essential hypertension   AKI (acute kidney injury) ([place])   #1 alcohol  intoxication/alcohol withdrawal -alcohol level 372-patient with tremors and very unsteady with PT. CONTINUE CIWA SCALE  ADD LIBRIUM 50 MG 3 TIMES DAILY 6 DOSES  TAPER THE DOSE OF LIBRIUM AND ADJUST THE DOSE AS NEEDED  HOPEFULLY THE PRECEDEX CAN BE STOPPED ONCE LIBRIUM STARTED.   Continue thiamine and folate. Mild elevation in LFTs with AST and ALT trending down with a total bili of 1 4.   Continue IV fluids.  #2 AKI secondary to dehydration and alcohol intake creatinine 2.23 at the time of admission down to 1.26 from 1.69 today. Continue IV fluids.  #3 history of essential hypertension -pressure 181/85 continue lisinopril and as needed hydralazine.  Patient was on HCTZ at home which is on hold.    #4 history of hyperlipidemia on Lipitor will hold since LFTs are high.  #5 history of BPH not on any medications at home, has a condom cath in place.  #6 hypokalemia/hypomagnesemia replete and recheck.  Check phosphorus levels.  Nutrition Problem: Increased nutrient needs Etiology: acute illness  Signs/Symptoms: estimated needs    Interventions: Prostat, Boost Breeze, MVI  Estimated body mass index is 25.04 kg/m as calculated from the following:   Height as of this encounter: 6\' 1"  (1.854 m).   Weight as of this encounter: 86.1 kg.   DVT prophylaxis:Lovenox  code Status:Full code Family Communication:dw cousin [place] disposition Plan:Patient came from home I am hoping that he can be discharged home once his alcohol withdrawal is controlled Barriers to discharge-high CIWA  alcohol withdrawal needs supportive therapy and monitoring  Consultants:  None  Procedures:None Antimicrobials:None  Subjective:  Patient resting  in bed on Precedex drip Wakes up some Overnight he received 23 mg of Ativan and then had to be started on Precedex since he was still agitated and constantly kicking unable to relax  Objective: Vitals:   06/24/19 0400 06/24/19 0600 06/24/19 0700  06/24/19 0832  BP: (!) 158/84   (!) 181/85  Pulse: 61 (!) 54  (!) 45  Resp: 18   15  Temp: 98.8 F (37.1 C)  97.6 F (36.4 C)   TempSrc: Axillary  Axillary   SpO2: 91%   99%  Weight:      Height:        Intake/Output Summary (Last 24 hours) at 06/24/2019 0852 Last data filed at 06/24/2019 0603 Gross per 24 hour  Intake 2172.65 ml  Output 401 ml  Net 1771.65 ml   Filed Weights   06/21/19 2121 06/23/19 1908  Weight: 80.3 kg 86.1 kg    Examination:  General exam: Appears calm and comfortable  Respiratory system: Clear to auscultation. Respiratory effort normal. Cardiovascular system: S1 & S2 heard, RRR. No JVD, murmurs, rubs, gallops or clicks. No pedal edema. Gastrointestinal system: Abdomen is nondistended, soft and nontender. No organomegaly or masses felt. Normal bowel sounds heard. Central nervous system:sedated Extremities: Symmetric 5 x 5 power. Skin: No rashes, lesions or ulcers Psychiatry: unable to assess  Data Reviewed: I have personally reviewed following labs and imaging studies  CBC: Recent Labs  Lab 06/21/19 1800 06/22/19 0554 06/23/19 1214 06/24/19 0218  WBC 4.9 4.4 4.0 5.3  NEUTROABS 3.5  --   --   --   HGB 11.9* 10.2* 10.4* 10.3*  HCT 35.4* 31.3* 32.3* 32.1*  MCV 95.2 96.9 98.2 98.5  PLT 102* 75* 64* 59*   Basic Metabolic Panel: Recent Labs  Lab 06/21/19 1800 06/21/19 2247 06/22/19 0554 06/23/19 1221 06/24/19 0218  NA 140 141 143 138 138  K 4.1 4.4 4.3 4.1 3.4*  CL 101 106 108 104 106  CO2 23 25 24 24  20*  GLUCOSE 150* 109* 82 101* 93  BUN 29* 24* 24* 23 18  CREATININE 2.23* 1.94* 1.69* 1.33* 1.26*  CALCIUM 8.0* 7.4* 7.7* 8.0* 7.8*  MG  --  1.7  --  1.1*  --   PHOS  --  3.6  --   --   --    GFR: Estimated Creatinine Clearance: 64.3 mL/min (A) (by C-G formula based on SCr of 1.26 mg/dL (H)). Liver Function Tests: Recent Labs  Lab 06/21/19 1800 06/21/19 2247 06/22/19 0554 06/23/19 1221 06/24/19 0218  AST 82* 72* 90* 58* 46*    ALT 67* 60* 62* 52* 46*  ALKPHOS 103 103 99 92 87  BILITOT 0.7 0.6 0.8 1.0 1.4*  PROT 6.4* 5.7* 5.4* 5.3* 5.7*  ALBUMIN 3.5 3.1* 2.9* 3.0* 3.2*   No results for input(s): LIPASE, AMYLASE in the last 168 hours. Recent Labs  Lab 06/23/19 1311  AMMONIA 39*   Coagulation Profile: No results for input(s): INR, PROTIME in the last 168 hours. Cardiac Enzymes: Recent Labs  Lab 06/21/19 1926  CKTOTAL 88   BNP (last 3 results) No results for input(s): PROBNP in the last 8760 hours. HbA1C: No results for input(s): HGBA1C in the last 72 hours. CBG: Recent Labs  Lab 06/23/19 1152  GLUCAP 97   Lipid Profile: No results for input(s): CHOL, HDL, LDLCALC, TRIG, CHOLHDL, LDLDIRECT in the last 72 hours. Thyroid Function Tests: No results for input(s): TSH, T4TOTAL, FREET4, T3FREE, THYROIDAB in the last 72 hours.  Anemia Panel: No results for input(s): VITAMINB12, FOLATE, FERRITIN, TIBC, IRON, RETICCTPCT in the last 72 hours. Sepsis Labs: No results for input(s): PROCALCITON, LATICACIDVEN in the last 168 hours.  Recent Results (from the past 240 hour(s))  Respiratory Panel by RT PCR (Flu A&B, Covid) - Nasopharyngeal Swab     Status: None   Collection Time: 06/21/19  8:49 PM   Specimen: Nasopharyngeal Swab  Result Value Ref Range Status   SARS Coronavirus 2 by RT PCR NEGATIVE NEGATIVE Final    Comment: (NOTE) SARS-CoV-2 target nucleic acids are NOT DETECTED. The SARS-CoV-2 RNA is generally detectable in upper respiratoy specimens during the acute phase of infection. The lowest concentration of SARS-CoV-2 viral copies this assay can detect is 131 copies/mL. A negative result does not preclude SARS-Cov-2 infection and should not be used as the sole basis for treatment or other patient management decisions. A negative result may occur with  improper specimen collection/handling, submission of specimen other than nasopharyngeal swab, presence of viral mutation(s) within the areas  targeted by this assay, and inadequate number of viral copies (<131 copies/mL). A negative result must be combined with clinical observations, patient history, and epidemiological information. The expected result is Negative. Fact Sheet for Patients:  PinkCheek.be Fact Sheet for Healthcare Providers:  GravelBags.it This test is not yet ap proved or cleared by the [place] FDA and  has been authorized for detection and/or diagnosis of SARS-CoV-2 by FDA under an Emergency Use Authorization (EUA). This EUA will remain  in effect (meaning this test can be used) for the duration of the COVID-19 declaration under Section 564(b)(1) of the Act, 21 U.S.C. section 360bbb-3(b)(1), unless the authorization is terminated or revoked sooner.    Influenza A by PCR NEGATIVE NEGATIVE Final   Influenza B by PCR NEGATIVE NEGATIVE Final    Comment: (NOTE) The Xpert Xpress SARS-CoV-2/FLU/RSV assay is intended as an aid in  the diagnosis of influenza from Nasopharyngeal swab specimens and  should not be used as a sole basis for treatment. Nasal washings and  aspirates are unacceptable for Xpert Xpress SARS-CoV-2/FLU/RSV  testing. Fact Sheet for Patients: PinkCheek.be Fact Sheet for Healthcare Providers: GravelBags.it This test is not yet approved or cleared by the [place] FDA and  has been authorized for detection and/or diagnosis of SARS-CoV-2 by  FDA under an Emergency Use Authorization (EUA). This EUA will remain  in effect (meaning this test can be used) for the duration of the  Covid-19 declaration under Section 564(b)(1) of the Act, 21  U.S.C. section 360bbb-3(b)(1), unless the authorization is  terminated or revoked. Performed at [facility], [place] [address], [place], [place] [address]   MRSA PCR Screening     Status: None   Collection Time: 06/23/19   7:11 PM   Specimen: Nasal Mucosa; Nasopharyngeal  Result Value Ref Range Status   MRSA by PCR NEGATIVE NEGATIVE Final    Comment:        The GeneXpert MRSA Assay (FDA approved for NASAL specimens only), is one component of a comprehensive MRSA colonization surveillance program. It is not intended to diagnose MRSA infection nor to guide or monitor treatment for MRSA infections. Performed at [facility], [place] [address], [place], [place] [address]          Radiology Studies: No results found.      Scheduled Meds: . chlordiazePOXIDE  50 mg Oral TID  . Chlorhexidine Gluconate Cloth  6 each Topical Daily  . enoxaparin (  LOVENOX) injection  40 mg Subcutaneous Q24H  . feeding supplement  1 Container Oral BID BM  . feeding supplement (PRO-STAT SUGAR FREE 64)  30 mL Oral BID  . folic acid  1 mg Oral Daily  . lisinopril  40 mg Oral Daily  . mirtazapine  7.5 mg Oral QHS  . multivitamin with minerals  1 tablet Oral Daily  . thiamine  100 mg Oral Daily   Or  . thiamine  100 mg Intravenous Daily   Continuous Infusions: . sodium chloride 1,000 mL (06/23/19 1925)  . dexmedetomidine (PRECEDEX) IV infusion 0.6 mcg/kg/hr (06/24/19 0821)  . magnesium sulfate bolus IVPB 2 g (06/24/19 0820)     LOS: 3 days      Georgette Shell, MD 06/24/2019, 8:52 AM

## 2019-06-24 NOTE — Progress Notes (Signed)
Dr. Rodena Piety notified of systolic blood pressures consistently over 160. Hydralazine ordered for q6 prn and given accordingly. No further orders given.

## 2019-06-24 NOTE — Progress Notes (Signed)
eLink Physician-Brief Progress Note Patient Name: Ruben [name] DOB: [birth year] MRN: [id number]   Date of Service  06/24/2019  HPI/Events of Note  Pt admitted with ETOH withdrawal, he is still extremely agitated despite a total of 23 mg of ativan so far,.  eICU Interventions  Precedex infusion ordered to be titrated to RAS 0 to -1.        [name] 06/24/2019, 12:59 AM

## 2019-06-25 LAB — CBC
HCT: 33.5 % — ABNORMAL LOW (ref 39.0–52.0)
Hemoglobin: 11 g/dL — ABNORMAL LOW (ref 13.0–17.0)
MCH: 32.5 pg (ref 26.0–34.0)
MCHC: 32.8 g/dL (ref 30.0–36.0)
MCV: 99.1 fL (ref 80.0–100.0)
Platelets: 75 10*3/uL — ABNORMAL LOW (ref 150–400)
RBC: 3.38 MIL/uL — ABNORMAL LOW (ref 4.22–5.81)
RDW: 15.8 % — ABNORMAL HIGH (ref 11.5–15.5)
WBC: 5.1 10*3/uL (ref 4.0–10.5)
nRBC: 0 % (ref 0.0–0.2)

## 2019-06-25 LAB — BASIC METABOLIC PANEL
Anion gap: 14 (ref 5–15)
BUN: 16 mg/dL (ref 8–23)
CO2: 16 mmol/L — ABNORMAL LOW (ref 22–32)
Calcium: 7.7 mg/dL — ABNORMAL LOW (ref 8.9–10.3)
Chloride: 105 mmol/L (ref 98–111)
Creatinine, Ser: 1.12 mg/dL (ref 0.61–1.24)
GFR calc Af Amer: >60 mL/min (ref >60)
GFR calc non Af Amer: >60 mL/min (ref >60)
Glucose, Bld: 89 mg/dL (ref 70–99)
Potassium: 3.8 mmol/L (ref 3.5–5.1)
Sodium: 135 mmol/L (ref 135–145)

## 2019-06-25 MED ORDER — METOPROLOL TARTRATE 5 MG/5ML IV SOLN
5.0000 mg | INTRAVENOUS | Status: DC | PRN
  Administered 2019-06-26 – 2019-06-27 (×2): 5 mg via INTRAVENOUS
  Filled 2019-06-25 (×2): qty 5

## 2019-06-25 MED ORDER — METOPROLOL TARTRATE 5 MG/5ML IV SOLN
INTRAVENOUS | Status: AC
  Administered 2019-06-25: 5 mg via INTRAVENOUS
  Filled 2019-06-25: qty 5

## 2019-06-25 MED ORDER — POLYVINYL ALCOHOL 1.4 % OP SOLN
1.0000 [drp] | OPHTHALMIC | Status: DC | PRN
  Administered 2019-06-25: 1 [drp] via OPHTHALMIC
  Filled 2019-06-25: qty 15

## 2019-06-25 MED ORDER — METOPROLOL TARTRATE 5 MG/5ML IV SOLN: 5.0000 mg | Freq: Once | INTRAVENOUS | Status: AC

## 2019-06-25 MED ORDER — NON FORMULARY: 10.0000 mg | Freq: Every evening | Status: DC | PRN

## 2019-06-25 MED ORDER — PHENOL 1.4 % MT LIQD
1.0000 | OROMUCOSAL | Status: DC | PRN
  Administered 2019-06-25: 1 via OROMUCOSAL
  Filled 2019-06-25: qty 177

## 2019-06-25 NOTE — Progress Notes (Signed)
PROGRESS NOTE    Ruben [name]  [id number] DOB: 10/20/1950 DOA: 06/21/2019 PCP: [name], MD   Brief Narrative: Ruben [name] is a 69 y.o. male with a history of alcohol use, BPH, aortic stenosis, recent COVID-19 infection. Patient presented secondary to alcohol intoxication with the desire to quit drinking and developed severe alcohol withdrawal.   Assessment & Plan:   Principal Problem:   Alcohol intoxication ([place]) Active Problems:   GERD (gastroesophageal reflux disease)   BPH (benign prostatic hyperplasia)   Essential hypertension   AKI (acute kidney injury) ([place])   Alcohol withdrawal Severe. Initially, patient managed on CIWA requiring as much as 23 mg of Ativan. Patient transitioned to Precedex drip on 3/16 with improvement. Started on Librium 50 mg TID on 3/16. -Wean Precedex off -Continue Librium 50 mg TID -Continue CIWA  AKI Creatinine peak of 2.23. Trended down with IV fluids. Resolved.  Essential hypertension Initially uncontrolled. On amlodipine. Improved while on Precedex.  Hyperlipidemia -Continue Lipitor  Hypokalemia Hypomagnesemia Given supplementation. Resolved.  Thrombocytopenia In setting of alcohol use. Stable.   DVT prophylaxis: SCDs Code Status:   Code Status: Full Code Family Communication: None Disposition Plan: Discharge pending improvement of withdrawal symptoms   Consultants:   None  Procedures:   None  Antimicrobials:  None    Subjective: No concerns today.  Objective: Vitals:   06/25/19 0652 06/25/19 0700 06/25/19 0800 06/25/19 0900  BP:  (!) 146/74 (!) 145/78 (!) 141/75  Pulse:  (!) 57 (!) 58 (!) 58  Resp:  18 16 15   Temp: 98.5 F (36.9 C)     TempSrc: Axillary     SpO2:  100% 95% 96%  Weight:      Height:        Intake/Output Summary (Last 24 hours) at 06/25/2019 0940 Last data filed at 06/25/2019 0900 Gross per 24 hour  Intake 2479.78 ml  Output 2150 ml  Net 329.78 ml   Filed Weights   06/21/19 2121 06/23/19 1908  Weight: 80.3 kg 86.1 kg    Examination:  General exam: Appears calm and comfortable Respiratory system: Clear to auscultation. Respiratory effort normal. Cardiovascular system: S1 & S2 heard, RRR. No murmurs, rubs, gallops or clicks. Gastrointestinal system: Abdomen is nondistended, soft and nontender. No organomegaly or masses felt. Normal bowel sounds heard. Central nervous system: Somnolent but arouses easily. Oriented. No focal neurological deficits. Extremities: Mild BLE edema. No calf tenderness Skin: No cyanosis. No rashes Psychiatry: Judgement and insight appear normal. Mood & affect appropriate.     Data Reviewed: I have personally reviewed following labs and imaging studies  CBC: Recent Labs  Lab 06/21/19 1800 06/22/19 0554 06/23/19 1214 06/24/19 0218 06/25/19 0848  WBC 4.9 4.4 4.0 5.3 5.1  NEUTROABS 3.5  --   --   --   --   HGB 11.9* 10.2* 10.4* 10.3* 11.0*  HCT 35.4* 31.3* 32.3* 32.1* 33.5*  MCV 95.2 96.9 98.2 98.5 99.1  PLT 102* 75* 64* 59* 75*   Basic Metabolic Panel: Recent Labs  Lab 06/21/19 2247 06/22/19 0554 06/23/19 1221 06/24/19 0218 06/25/19 0848  NA 141 143 138 138 135  K 4.4 4.3 4.1 3.4* 3.8  CL 106 108 104 106 105  CO2 25 24 24  20* 16*  GLUCOSE 109* 82 101* 93 89  BUN 24* 24* 23 18 16   CREATININE 1.94* 1.69* 1.33* 1.26* 1.12  CALCIUM 7.4* 7.7* 8.0* 7.8* 7.7*  MG 1.7  --  1.1* 1.2*  --  PHOS 3.6  --   --  1.9*  --    GFR: Estimated Creatinine Clearance: 72.3 mL/min (by C-G formula based on SCr of 1.12 mg/dL). Liver Function Tests: Recent Labs  Lab 06/21/19 1800 06/21/19 2247 06/22/19 0554 06/23/19 1221 06/24/19 0218  AST 82* 72* 90* 58* 46*  ALT 67* 60* 62* 52* 46*  ALKPHOS 103 103 99 92 87  BILITOT 0.7 0.6 0.8 1.0 1.4*  PROT 6.4* 5.7* 5.4* 5.3* 5.7*  ALBUMIN 3.5 3.1* 2.9* 3.0* 3.2*   No results for input(s): LIPASE, AMYLASE in the last 168 hours. Recent Labs  Lab 06/23/19 1311  AMMONIA 39*     Coagulation Profile: No results for input(s): INR, PROTIME in the last 168 hours. Cardiac Enzymes: Recent Labs  Lab 06/21/19 1926  CKTOTAL 88   BNP (last 3 results) No results for input(s): PROBNP in the last 8760 hours. HbA1C: No results for input(s): HGBA1C in the last 72 hours. CBG: Recent Labs  Lab 06/23/19 1152  GLUCAP 97   Lipid Profile: No results for input(s): CHOL, HDL, LDLCALC, TRIG, CHOLHDL, LDLDIRECT in the last 72 hours. Thyroid Function Tests: No results for input(s): TSH, T4TOTAL, FREET4, T3FREE, THYROIDAB in the last 72 hours. Anemia Panel: No results for input(s): VITAMINB12, FOLATE, FERRITIN, TIBC, IRON, RETICCTPCT in the last 72 hours. Sepsis Labs: No results for input(s): PROCALCITON, LATICACIDVEN in the last 168 hours.  Recent Results (from the past 240 hour(s))  Respiratory Panel by RT PCR (Flu A&B, Covid) - Nasopharyngeal Swab     Status: None   Collection Time: 06/21/19  8:49 PM   Specimen: Nasopharyngeal Swab  Result Value Ref Range Status   SARS Coronavirus 2 by RT PCR NEGATIVE NEGATIVE Final    Comment: (NOTE) SARS-CoV-2 target nucleic acids are NOT DETECTED. The SARS-CoV-2 RNA is generally detectable in upper respiratoy specimens during the acute phase of infection. The lowest concentration of SARS-CoV-2 viral copies this assay can detect is 131 copies/mL. A negative result does not preclude SARS-Cov-2 infection and should not be used as the sole basis for treatment or other patient management decisions. A negative result may occur with  improper specimen collection/handling, submission of specimen other than nasopharyngeal swab, presence of viral mutation(s) within the areas targeted by this assay, and inadequate number of viral copies (<131 copies/mL). A negative result must be combined with clinical observations, patient history, and epidemiological information. The expected result is Negative. Fact Sheet for Patients:   PinkCheek.be Fact Sheet for Healthcare Providers:  GravelBags.it This test is not yet ap proved or cleared by the [place] FDA and  has been authorized for detection and/or diagnosis of SARS-CoV-2 by FDA under an Emergency Use Authorization (EUA). This EUA will remain  in effect (meaning this test can be used) for the duration of the COVID-19 declaration under Section 564(b)(1) of the Act, 21 U.S.C. section 360bbb-3(b)(1), unless the authorization is terminated or revoked sooner.    Influenza A by PCR NEGATIVE NEGATIVE Final   Influenza B by PCR NEGATIVE NEGATIVE Final    Comment: (NOTE) The Xpert Xpress SARS-CoV-2/FLU/RSV assay is intended as an aid in  the diagnosis of influenza from Nasopharyngeal swab specimens and  should not be used as a sole basis for treatment. Nasal washings and  aspirates are unacceptable for Xpert Xpress SARS-CoV-2/FLU/RSV  testing. Fact Sheet for Patients: PinkCheek.be Fact Sheet for Healthcare Providers: GravelBags.it This test is not yet approved or cleared by the [place] FDA and  has been  authorized for detection and/or diagnosis of SARS-CoV-2 by  FDA under an Emergency Use Authorization (EUA). This EUA will remain  in effect (meaning this test can be used) for the duration of the  Covid-19 declaration under Section 564(b)(1) of the Act, 21  U.S.C. section 360bbb-3(b)(1), unless the authorization is  terminated or revoked. Performed at [facility], [place] [address] [place], [place] [address]   MRSA PCR Screening     Status: None   Collection Time: 06/23/19  7:11 PM   Specimen: Nasal Mucosa; Nasopharyngeal  Result Value Ref Range Status   MRSA by PCR NEGATIVE NEGATIVE Final    Comment:        The GeneXpert MRSA Assay (FDA approved for NASAL specimens only), is one component of a comprehensive MRSA  colonization surveillance program. It is not intended to diagnose MRSA infection nor to guide or monitor treatment for MRSA infections. Performed at [facility], [place] [address] [place], [place] [address]          Radiology Studies: No results found.      Scheduled Meds: . amLODipine  5 mg Oral Daily  . chlordiazePOXIDE  50 mg Oral TID  . Chlorhexidine Gluconate Cloth  6 each Topical Daily  . feeding supplement  1 Container Oral BID BM  . feeding supplement (PRO-STAT SUGAR FREE 64)  30 mL Oral BID  . folic acid  1 mg Oral Daily  . lisinopril  40 mg Oral Daily  . mirtazapine  7.5 mg Oral QHS  . multivitamin with minerals  1 tablet Oral Daily  . thiamine  100 mg Oral Daily   Or  . thiamine  100 mg Intravenous Daily   Continuous Infusions: . sodium chloride 75 mL/hr at 06/25/19 0900  . dexmedetomidine (PRECEDEX) IV infusion Stopped (06/25/19 [id number])     LOS: 4 days     Cordelia Poche, MD [facility] 06/25/2019, 9:40 AM  If 7PM-7AM, please contact night-coverage [email or web address]

## 2019-06-25 NOTE — Progress Notes (Signed)
Patient HR sustained in 140s-150s, patient resting in bed. BP 111/73. Patient denies chest pain, no headache, no palpitations. No visible distress upon assessment, slight sensitivity to light, CIWA 3 charted .   MD R. Netty notified.   EKG completed per order. ST noted on EKG.   Follow up orders: 5mg  Metoprolol given for 1 time dose.   Dose immediately effective, HR 90-100 SR

## 2019-06-25 NOTE — Progress Notes (Signed)
PT Cancellation Note  Patient Details Name: Ruben [name] MRN: [id number] DOB: 1950-11-09   Cancelled Treatment:    Reason Eval/Treat Not Completed: Medical issues which prohibited therapy, HR jumping up into 150' s will check back another time. ,  Claretha Cooper 06/25/2019, 2:23 PM

## 2019-06-26 DIAGNOSIS — F10921 Alcohol use, unspecified with intoxication delirium: Secondary | ICD-10-CM

## 2019-06-26 MED ORDER — MELATONIN 5 MG PO TABS
10.0000 mg | ORAL_TABLET | Freq: Every evening | ORAL | Status: DC | PRN
  Administered 2019-06-26: 10 mg via ORAL
  Filled 2019-06-26: qty 2

## 2019-06-26 NOTE — TOC Progression Note (Signed)
Transition of Care [facility]) - Progression Note    Patient Details  Name: Ruben [name] MRN: [id number] Date of Birth: Aug 11, 1950  [facility] CM/SW Contact  Marnita Poirier, Juliann Pulse, RN Phone Number: 06/26/2019, 3:37 PM  Clinical Narrative: Patient states his d/c plan is going to stay with family [place] [facility] [place] [address]. He will drive there. He wanted info for alcohol otpt rehab since he has gone to [facility]-he says he will call on his own. PT recc supv/HHPT-patient states he will have family to support him in [place]. No further CM needs.      Expected Discharge Plan: Home/Self Care Barriers to Discharge: Continued Medical Work up  Expected Discharge Plan and Services Expected Discharge Plan: Home/Self Care   Discharge Planning Services: CM Consult   Living arrangements for the past 2 months: Single Family Home                 DME Arranged: N/A DME Agency: NA       HH Arranged: NA HH Agency: NA         Social Determinants of Health (SDOH) Interventions    Readmission Risk Interventions No flowsheet data found.

## 2019-06-26 NOTE — TOC Progression Note (Signed)
Transition of Care [facility]) - Progression Note    Patient Details  Name: Ruben [name] MRN: [id number] Date of Birth: 07-12-1950  Transition of Care [facility]) CM/SW Contact  Demarkus Remmel, Juliann Pulse, RN Phone Number: 06/26/2019, 7:26 PM Clinical Narrative:  Spoke to patient's primary contact Westley Hummer per patient request-patient will stay with [facility] @ d/c-address [address] want HHC-PT recc HHPT. [facility], [facility] main tel#[phone] spoke to [facility] she will inform rep to call me back if able to accept.[facility] rep Zach aware of rw.     Expected Discharge Plan: [place] Barriers to Discharge: Continued Medical Work up  Expected Discharge Plan and Services Expected Discharge Plan: [place]   Discharge Planning Services: CM Consult   Living arrangements for the past 2 months: Single Family Home                 DME Arranged: N/A DME Agency: NA       HH Arranged: NA HH Agency: NA         Social Determinants of Health (SDOH) Interventions    Readmission Risk Interventions No flowsheet data found.

## 2019-06-26 NOTE — Progress Notes (Signed)
Physical Therapy Treatment Patient Details Name: Ruben [name] MRN: [id number] DOB: Aug 16, 1950 Today's Date: 06/26/2019    History of Present Illness 69 yo male admitted with ETOH intoxication/withdrawal symptoms. Hx of ETOH abuse, COVID, OA    PT Comments    Pt assisted with ambulating in hallway however HR up to 138 bpm, and pt requiring at least min assist for balance.  Recommend 24/7 assist if d/c home for safety.   Follow Up Recommendations  Supervision/Assistance - 24 hour;Home health PT     Equipment Recommendations  Rolling walker with 5" wheels    Recommendations for Other Services       Precautions / Restrictions Precautions Precautions: Fall Precaution Comments: monitor HR    Mobility  Bed Mobility Overal bed mobility: Needs Assistance Bed Mobility: Supine to Sit     Supine to sit: Supervision;HOB elevated     General bed mobility comments: increased time and effort  Transfers Overall transfer level: Needs assistance Equipment used: None Transfers: Sit to/from Stand Sit to Stand: Min assist         General transfer comment: assist for steadying; cues for hand placement  Ambulation/Gait Ambulation/Gait assistance: Min assist Gait Distance (Feet): 120 Feet Assistive device: IV Pole;1 person hand held assist Gait Pattern/deviations: Step-through pattern;Decreased stride length;Narrow base of support     General Gait Details: pt declined using RW however requiring bil UE support so provided HHA along with pt holding IV pole, assist for balance with turning and taking steps backwards; HR up to 137 bpm   Stairs             Wheelchair Mobility    Modified Rankin (Stroke Patients Only)       Balance Overall balance assessment: Needs assistance         Standing balance support: No upper extremity supported Standing balance-Leahy Scale: Fair               High level balance activites: Turns;Backward walking High Level Balance  Comments: requires assist for steadying            Cognition Arousal/Alertness: Awake/alert Behavior During Therapy: WFL for tasks assessed/performed Overall Cognitive Status: Impaired/Different from baseline Area of Impairment: Problem solving;Safety/judgement                         Safety/Judgement: Decreased awareness of safety;Decreased awareness of deficits   Problem Solving: Requires verbal cues        Exercises      General Comments        Pertinent Vitals/Pain Pain Assessment: No/denies pain    Home Living                      Prior Function            PT Goals (current goals can now be found in the care plan section) Progress towards PT goals: Progressing toward goals    Frequency    Min 3X/week      PT Plan Current plan remains appropriate    Co-evaluation              AM-PAC PT "6 Clicks" Mobility   Outcome Measure  Help needed turning from your back to your side while in a flat bed without using bedrails?: A Little Help needed moving from lying on your back to sitting on the side of a flat bed without using bedrails?: A Little Help needed moving to and  from a bed to a chair (including a wheelchair)?: A Little Help needed standing up from a chair using your arms (e.g., wheelchair or bedside chair)?: A Little Help needed to walk in hospital room?: A Little Help needed climbing 3-5 steps with a railing? : A Little 6 Click Score: 18    End of Session Equipment Utilized During Treatment: Gait belt Activity Tolerance: Patient tolerated treatment well Patient left: in chair;with call bell/phone within reach;with chair alarm set Nurse Communication: Mobility status PT Visit Diagnosis: Unsteadiness on feet (R26.81);Difficulty in walking, not elsewhere classified (R26.2)     Time: [id number] PT Time Calculation (min) (ACUTE ONLY): 15 min  Charges:  $Gait Training: 8-22 mins                     Arlyce Dice, DPT Acute  Rehabilitation Services Office: [phone]  Trena Platt 06/26/2019, 3:27 PM

## 2019-06-26 NOTE — Progress Notes (Addendum)
PROGRESS NOTE    Ruben [name]  [id number] DOB: 1951/02/04 DOA: 06/21/2019 PCP: [name], MD   Brief Narrative: Ruben [name] is a 69 y.o. male with a history of alcohol use, BPH, recent COVID-19 infection. Patient presented secondary to alcohol intoxication with the desire to quit drinking and developed severe alcohol withdrawal.   Assessment & Plan:   Principal Problem:   Alcohol intoxication ([place]) Active Problems:   GERD (gastroesophageal reflux disease)   BPH (benign prostatic hyperplasia)   Essential hypertension   AKI (acute kidney injury) ([place])   Alcohol withdrawal Severe. Initially, patient managed on CIWA requiring as much as 23 mg of Ativan. Patient transitioned to Precedex drip on 3/16 with improvement which was weaned off on 3/17. Started on Librium 50 mg TID on 3/16 and has completed course -Continue CIWA  AKI Creatinine peak of 2.23. Trended down with IV fluids. Resolved.  Essential hypertension Initially uncontrolled. On amlodipine. Improved while on Precedex.  Hyperlipidemia -Continue Lipitor  Hypokalemia Hypomagnesemia Given supplementation. Resolved.  Thrombocytopenia In setting of alcohol use. Stable.  Sinus tachycardia Possibly secondary to withdrawal although patient states he has a history of this for many years and has seen cardiology for evaluation. -Telemetry   DVT prophylaxis: SCDs Code Status:   Code Status: Full Code Family Communication: None Disposition Plan: Discharge likely tomorrow if continues to be stable and pending PT recommendations for safety   Consultants:   None  Procedures:   None  Antimicrobials:  None    Subjective: Wondering about his tachycardia. No other issues  Objective: Vitals:   06/26/19 0644 06/26/19 1036 06/26/19 1058 06/26/19 1435  BP: 122/75 (!) 134/92  122/77  Pulse: 84  (!) 145 (!) 134  Resp: 18     Temp: 98.3 F (36.8 C) 97.7 F (36.5 C)  98.9 F (37.2 C)  TempSrc: Oral  Axillary  Oral  SpO2: 97% 100%  97%  Weight:      Height:        Intake/Output Summary (Last 24 hours) at 06/26/2019 1516 Last data filed at 06/26/2019 1033 Gross per 24 hour  Intake 892.18 ml  Output 1350 ml  Net -457.82 ml   Filed Weights   06/21/19 2121 06/23/19 1908  Weight: 80.3 kg 86.1 kg    Examination:  General exam: Appears calm and comfortable Respiratory system: Clear to auscultation. Respiratory effort normal. Cardiovascular system: S1 & S2 heard, tachycardia, normal rhythm. No murmurs, rubs, gallops or clicks. Gastrointestinal system: Abdomen is nondistended, soft and nontender. No organomegaly or masses felt. Normal bowel sounds heard. Central nervous system: Alert and oriented. No focal neurological deficits. Extremities: No edema. No calf tenderness Skin: No cyanosis. No rashes Psychiatry: Judgement and insight appear normal. Mood & affect appropriate.      Data Reviewed: I have personally reviewed following labs and imaging studies  CBC: Recent Labs  Lab 06/21/19 1800 06/22/19 0554 06/23/19 1214 06/24/19 0218 06/25/19 0848  WBC 4.9 4.4 4.0 5.3 5.1  NEUTROABS 3.5  --   --   --   --   HGB 11.9* 10.2* 10.4* 10.3* 11.0*  HCT 35.4* 31.3* 32.3* 32.1* 33.5*  MCV 95.2 96.9 98.2 98.5 99.1  PLT 102* 75* 64* 59* 75*   Basic Metabolic Panel: Recent Labs  Lab 06/21/19 2247 06/22/19 0554 06/23/19 1221 06/24/19 0218 06/25/19 0848  NA 141 143 138 138 135  K 4.4 4.3 4.1 3.4* 3.8  CL 106 108 104 106 105  CO2 25 24  24 20* 16*  GLUCOSE 109* 82 101* 93 89  BUN 24* 24* 23 18 16   CREATININE 1.94* 1.69* 1.33* 1.26* 1.12  CALCIUM 7.4* 7.7* 8.0* 7.8* 7.7*  MG 1.7  --  1.1* 1.2*  --   PHOS 3.6  --   --  1.9*  --    GFR: Estimated Creatinine Clearance: 72.3 mL/min (by C-G formula based on SCr of 1.12 mg/dL). Liver Function Tests: Recent Labs  Lab 06/21/19 1800 06/21/19 2247 06/22/19 0554 06/23/19 1221 06/24/19 0218  AST 82* 72* 90* 58* 46*  ALT 67* 60*  62* 52* 46*  ALKPHOS 103 103 99 92 87  BILITOT 0.7 0.6 0.8 1.0 1.4*  PROT 6.4* 5.7* 5.4* 5.3* 5.7*  ALBUMIN 3.5 3.1* 2.9* 3.0* 3.2*   No results for input(s): LIPASE, AMYLASE in the last 168 hours. Recent Labs  Lab 06/23/19 1311  AMMONIA 39*   Coagulation Profile: No results for input(s): INR, PROTIME in the last 168 hours. Cardiac Enzymes: Recent Labs  Lab 06/21/19 1926  CKTOTAL 88   BNP (last 3 results) No results for input(s): PROBNP in the last 8760 hours. HbA1C: No results for input(s): HGBA1C in the last 72 hours. CBG: Recent Labs  Lab 06/23/19 1152  GLUCAP 97   Lipid Profile: No results for input(s): CHOL, HDL, LDLCALC, TRIG, CHOLHDL, LDLDIRECT in the last 72 hours. Thyroid Function Tests: No results for input(s): TSH, T4TOTAL, FREET4, T3FREE, THYROIDAB in the last 72 hours. Anemia Panel: No results for input(s): VITAMINB12, FOLATE, FERRITIN, TIBC, IRON, RETICCTPCT in the last 72 hours. Sepsis Labs: No results for input(s): PROCALCITON, LATICACIDVEN in the last 168 hours.  Recent Results (from the past 240 hour(s))  Respiratory Panel by RT PCR (Flu A&B, Covid) - Nasopharyngeal Swab     Status: None   Collection Time: 06/21/19  8:49 PM   Specimen: Nasopharyngeal Swab  Result Value Ref Range Status   SARS Coronavirus 2 by RT PCR NEGATIVE NEGATIVE Final    Comment: (NOTE) SARS-CoV-2 target nucleic acids are NOT DETECTED. The SARS-CoV-2 RNA is generally detectable in upper respiratoy specimens during the acute phase of infection. The lowest concentration of SARS-CoV-2 viral copies this assay can detect is 131 copies/mL. A negative result does not preclude SARS-Cov-2 infection and should not be used as the sole basis for treatment or other patient management decisions. A negative result may occur with  improper specimen collection/handling, submission of specimen other than nasopharyngeal swab, presence of viral mutation(s) within the areas targeted by this  assay, and inadequate number of viral copies (<131 copies/mL). A negative result must be combined with clinical observations, patient history, and epidemiological information. The expected result is Negative. Fact Sheet for Patients:  PinkCheek.be Fact Sheet for Healthcare Providers:  GravelBags.it This test is not yet ap proved or cleared by the [place] FDA and  has been authorized for detection and/or diagnosis of SARS-CoV-2 by FDA under an Emergency Use Authorization (EUA). This EUA will remain  in effect (meaning this test can be used) for the duration of the COVID-19 declaration under Section 564(b)(1) of the Act, 21 U.S.C. section 360bbb-3(b)(1), unless the authorization is terminated or revoked sooner.    Influenza A by PCR NEGATIVE NEGATIVE Final   Influenza B by PCR NEGATIVE NEGATIVE Final    Comment: (NOTE) The Xpert Xpress SARS-CoV-2/FLU/RSV assay is intended as an aid in  the diagnosis of influenza from Nasopharyngeal swab specimens and  should not be used as a sole basis for  treatment. Nasal washings and  aspirates are unacceptable for Xpert Xpress SARS-CoV-2/FLU/RSV  testing. Fact Sheet for Patients: PinkCheek.be Fact Sheet for Healthcare Providers: GravelBags.it This test is not yet approved or cleared by the [place] FDA and  has been authorized for detection and/or diagnosis of SARS-CoV-2 by  FDA under an Emergency Use Authorization (EUA). This EUA will remain  in effect (meaning this test can be used) for the duration of the  Covid-19 declaration under Section 564(b)(1) of the Act, 21  U.S.C. section 360bbb-3(b)(1), unless the authorization is  terminated or revoked. Performed at [facility], [place] [address] [place], [place] [address]   MRSA PCR Screening     Status: None   Collection Time: 06/23/19  7:11 PM    Specimen: Nasal Mucosa; Nasopharyngeal  Result Value Ref Range Status   MRSA by PCR NEGATIVE NEGATIVE Final    Comment:        The GeneXpert MRSA Assay (FDA approved for NASAL specimens only), is one component of a comprehensive MRSA colonization surveillance program. It is not intended to diagnose MRSA infection nor to guide or monitor treatment for MRSA infections. Performed at [facility], [place] [address] [place], [place] [address]          Radiology Studies: No results found.      Scheduled Meds: . amLODipine  5 mg Oral Daily  . Chlorhexidine Gluconate Cloth  6 each Topical Daily  . feeding supplement  1 Container Oral BID BM  . feeding supplement (PRO-STAT SUGAR FREE 64)  30 mL Oral BID  . folic acid  1 mg Oral Daily  . lisinopril  40 mg Oral Daily  . mirtazapine  7.5 mg Oral QHS  . multivitamin with minerals  1 tablet Oral Daily  . thiamine  100 mg Oral Daily   Or  . thiamine  100 mg Intravenous Daily   Continuous Infusions: . dexmedetomidine (PRECEDEX) IV infusion Stopped (06/25/19 [id number])     LOS: 5 days     Cordelia Poche, MD [facility] 06/26/2019, 3:16 PM  If 7PM-7AM, please contact night-coverage [email or web address]

## 2019-06-27 DIAGNOSIS — F10231 Alcohol dependence with withdrawal delirium: Secondary | ICD-10-CM

## 2019-06-27 MED ORDER — METOPROLOL TARTRATE 25 MG PO TABS
25.0000 mg | ORAL_TABLET | Freq: Once | ORAL | Status: AC
  Administered 2019-06-27: 25 mg via ORAL
  Filled 2019-06-27: qty 1

## 2019-06-27 MED ORDER — ADULT MULTIVITAMIN W/MINERALS CH: 1.0000 | ORAL_TABLET | Freq: Every day | ORAL | Status: DC

## 2019-06-27 MED ORDER — THIAMINE HCL 100 MG PO TABS: 100.0000 mg | ORAL_TABLET | Freq: Every day | ORAL | Status: DC

## 2019-06-27 MED ORDER — METOPROLOL TARTRATE 25 MG PO TABS: 25.0000 mg | ORAL_TABLET | Freq: Two times a day (BID) | ORAL | 0 refills | Status: DC

## 2019-06-27 MED ORDER — FOLIC ACID 1 MG PO TABS: 1.0000 mg | ORAL_TABLET | Freq: Every day | ORAL | Status: DC

## 2019-06-27 NOTE — Care Management Important Message (Signed)
Important Message  Patient Details IM Letter given to Gabriel Earing RN Case Manager to present to the Patient Name: Ruben [name] MRN: [id number] Date of Birth: 1950-06-12   Medicare Important Message Given:  Yes     Kerin Salen 06/27/2019, 11:58 AM

## 2019-06-27 NOTE — Progress Notes (Signed)
Nutrition Follow-up  DOCUMENTATION CODES:   Not applicable  INTERVENTION:  - continue Boost Breeze BID and 30 ml prostat BID. - continue to encourage PO intakes.  * weigh patient today.    NUTRITION DIAGNOSIS:   Increased nutrient needs related to acute illness as evidenced by estimated needs. -ongoing  GOAL:   Patient will meet greater than or equal to 90% of their needs -met on average  MONITOR:   PO intake, Supplement acceptance, Labs, Weight trends  ASSESSMENT:   69 y.o. male with medical history significant of alcoholism, BPH, aortic sclerosis, COVID-19 infection in 03/2019, osteoarthritis, GERD, hyperlipidemia, and HTN. He quit smoking and drinking in the past, but recently went back to drinking.  He presented to the ED intoxicated and wanting to quit. He has been weak and dizzy. Patient said he woke up on the floor and was scared that he may have had a stroke. He drank 2 pints of liquor and was feeling tremulous. He wants to be admitted for detoxification and fear of falling and hurting himself.  Patient consumed 100% of breakfast on 3/14; 100% of lunch on 3/15; 0% of all meals on 3/16; 75% of lunch and 90% of dinner on 3/19; 50% of breakfast today. He has been accepting both supplements ~75% of the time offered. Weight +23 lb from 3/13-3/15 and patient has not been weighed since 3/15.  Per notes: - severe alcohol withdrawal on admission - AKI - hypokalemia and hypomagnesemia--resolved s/p repletion - thrombocytopenia - likely d/c today if he is stable    Labs reviewed; Ca: 7.7 mg/dl. Medications reviewed; 1 mg folvite/day, daily multivitamin with minerals, 100 mg oral thiamine/day.     Diet Order:   Diet Order            Diet Heart Room service appropriate? Yes; Fluid consistency: Thin  Diet effective now              EDUCATION NEEDS:   No education needs have been identified at this time  Skin:  Skin Assessment: Reviewed RN Assessment  Last BM:   3/16  Height:   Ht Readings from Last 1 Encounters:  06/23/19 6' 1"  (1.854 m)    Weight:   Wt Readings from Last 1 Encounters:  06/23/19 86.1 kg    Ideal Body Weight:  78.2 kg  BMI:  Body mass index is 25.04 kg/m.  Estimated Nutritional Needs:   Kcal:  2010-2250 kcal  Protein:  100-115 grams  Fluid:  >/= 2.2 L/day     Jarome Matin, MS, RD, LDN, CNSC Inpatient Clinical Dietitian RD pager # available in AMION  After hours/weekend pager # available in [facility]

## 2019-06-27 NOTE — Discharge Summary (Signed)
Physician Discharge Summary  Ruben [name] [id number] DOB: 01/28/1951 DOA: 06/21/2019  PCP: [name], MD  Admit date: 06/21/2019 Discharge date: 06/27/2019  Admitted From: Home Disposition: Home  Recommendations for Outpatient Follow-up:  1. Follow up with PCP in 1 week 2. Please follow up on the following pending results: None  Home Health: PT/OT Equipment/Devices: Rolling walker  Discharge Condition: Stable CODE STATUS: Full code Diet recommendation: Heart healthy   Brief/Interim Summary:  Admission HPI written by Elwyn Reach, MD   Chief Complaint: Alcohol abuse  HPI: Ruben [name] is a 69 y.o. male with medical history significant of alcoholism, BPH, aortic sclerosis, COVID-19 infection in December 2020, osteoarthritis, GERD, hyperlipidemia, hypertension who has quit smoking and drinking in the past but recently went back to drinking.  Patient came to the ER intoxicated and wanting to quit.  He has been weak dizzy.  Patient said he woke up on the floor today.  He was scared that he may have had a stroke.  He only took 2 pints of liquor but feeling bad and tremulous.  He wants to be admitted for detoxification and fear of falling and hurting himself.  Patient has elevated alcohol level and starting to show signs of withdrawal.  He is being admitted for further work-up.    Hospital course:  Alcohol withdrawal Severe. Initially, patient managed on CIWA requiring as much as 23 mg of Ativan. Patient transitioned to Precedex drip on 3/16 with improvement. Started on Librium 50 mg TID on 3/16 which was discontinued after six doses. Patient stable off of benzodiazepines. Social worker consulted and patient given resources for substance abuse.  AKI Creatinine peak of 2.23. Trended down with IV fluids. Resolved.  Essential hypertension Initially uncontrolled. On amlodipine. Improved while on Precedex.  Hyperlipidemia Continue  Lipitor  Hypokalemia Hypomagnesemia Given supplementation. Resolved.  Thrombocytopenia In setting of alcohol use. Stable.  Sinus tachycardia Patient previously worked up by cardiology. Sinus tachycardia. Reviewed by outpatient cardiologist. Recommendations for metoprolol 25 mg BID  Discharge Diagnoses:  Principal Problem:   Alcohol intoxication ([place]) Active Problems:   GERD (gastroesophageal reflux disease)   BPH (benign prostatic hyperplasia)   Essential hypertension   AKI (acute kidney injury) ([place])    Discharge Instructions   Allergies as of 06/27/2019   No Known Allergies     Medication List    STOP taking these medications   acetaminophen 325 MG tablet Commonly known as: TYLENOL   benzonatate 100 MG capsule Commonly known as: TESSALON   cephALEXin 500 MG capsule Commonly known as: KEFLEX   methocarbamol 500 MG tablet Commonly known as: ROBAXIN   ondansetron 4 MG disintegrating tablet Commonly known as: Zofran ODT     TAKE these medications   atorvastatin 20 MG tablet Commonly known as: LIPITOR Take 20 mg by mouth daily.   docusate sodium 100 MG capsule Commonly known as: COLACE Take 1 capsule (100 mg total) by mouth 2 (two) times daily. Ok to stop taking if having loose bowel movements What changed:   when to take this  reasons to take this   folic acid 1 MG tablet Commonly known as: FOLVITE Take 1 tablet (1 mg total) by mouth daily. Start taking on: June 28, 2019   lisinopril-hydrochlorothiazide 20-12.5 MG tablet Commonly known as: ZESTORETIC Take 2 tablets by mouth daily.   metoprolol tartrate 25 MG tablet Commonly known as: LOPRESSOR Take 1 tablet (25 mg total) by mouth 2 (two) times daily.  multivitamin with minerals Tabs tablet Take 1 tablet by mouth daily. Start taking on: June 28, 2019   thiamine 100 MG tablet Take 1 tablet (100 mg total) by mouth daily. Start taking on: June 28, 2019       No Known  Allergies  Consultations:  None   Procedures/Studies: CT Head Wo Contrast  Result Date: 06/21/2019 CLINICAL DATA:  69 year old male with altered mental status and ataxia. EXAM: CT HEAD WITHOUT CONTRAST TECHNIQUE: Contiguous axial images were obtained from the base of the skull through the vertex without intravenous contrast. COMPARISON:  11/23/2018 CT FINDINGS: Brain: No evidence of acute infarction, hemorrhage, hydrocephalus, extra-axial collection or mass lesion/mass effect. Atrophy and mild chronic small-vessel white matter ischemic changes again noted. Vascular: Carotid and vertebral atherosclerotic calcifications are noted. Skull: Normal. Negative for fracture or focal lesion. Sinuses/Orbits: No acute finding. Other: None. IMPRESSION: 1. No evidence of acute intracranial abnormality. 2. Atrophy and chronic small-vessel white matter ischemic changes. Electronically Signed   By: Margarette Canada M.D.   On: 06/21/2019 18:20      Subjective: Some fast heart rates.  Discharge Exam: Vitals:   06/27/19 0826 06/27/19 1023  BP:  (!) 132/95  Pulse: (!) 103 80  Resp:    Temp:  (!) 97.5 F (36.4 C)  SpO2:  99%   Vitals:   06/27/19 0505 06/27/19 0826 06/27/19 1023 06/27/19 1053  BP: 131/83  (!) 132/95   Pulse: 77 (!) 103 80   Resp: 18     Temp: 98.2 F (36.8 C)  (!) 97.5 F (36.4 C)   TempSrc: Oral  Oral   SpO2: 97%  99%   Weight:    86.8 kg  Height:        General: Pt is alert, awake, not in acute distress Cardiovascular: RRR, S1/S2 +, no rubs, no gallops Respiratory: CTA bilaterally, no wheezing, no rhonchi Abdominal: Soft, NT, ND, bowel sounds + Extremities: no edema, no cyanosis    The results of significant diagnostics from this hospitalization (including imaging, microbiology, ancillary and laboratory) are listed below for reference.     Microbiology: Recent Results (from the past 240 hour(s))  Respiratory Panel by RT PCR (Flu A&B, Covid) - Nasopharyngeal Swab      Status: None   Collection Time: 06/21/19  8:49 PM   Specimen: Nasopharyngeal Swab  Result Value Ref Range Status   SARS Coronavirus 2 by RT PCR NEGATIVE NEGATIVE Final    Comment: (NOTE) SARS-CoV-2 target nucleic acids are NOT DETECTED. The SARS-CoV-2 RNA is generally detectable in upper respiratoy specimens during the acute phase of infection. The lowest concentration of SARS-CoV-2 viral copies this assay can detect is 131 copies/mL. A negative result does not preclude SARS-Cov-2 infection and should not be used as the sole basis for treatment or other patient management decisions. A negative result may occur with  improper specimen collection/handling, submission of specimen other than nasopharyngeal swab, presence of viral mutation(s) within the areas targeted by this assay, and inadequate number of viral copies (<131 copies/mL). A negative result must be combined with clinical observations, patient history, and epidemiological information. The expected result is Negative. Fact Sheet for Patients:  PinkCheek.be Fact Sheet for Healthcare Providers:  GravelBags.it This test is not yet ap proved or cleared by the [place] FDA and  has been authorized for detection and/or diagnosis of SARS-CoV-2 by FDA under an Emergency Use Authorization (EUA). This EUA will remain  in effect (meaning this test can be used) for  the duration of the COVID-19 declaration under Section 564(b)(1) of the Act, 21 U.S.C. section 360bbb-3(b)(1), unless the authorization is terminated or revoked sooner.    Influenza A by PCR NEGATIVE NEGATIVE Final   Influenza B by PCR NEGATIVE NEGATIVE Final    Comment: (NOTE) The Xpert Xpress SARS-CoV-2/FLU/RSV assay is intended as an aid in  the diagnosis of influenza from Nasopharyngeal swab specimens and  should not be used as a sole basis for treatment. Nasal washings and  aspirates are unacceptable for  Xpert Xpress SARS-CoV-2/FLU/RSV  testing. Fact Sheet for Patients: PinkCheek.be Fact Sheet for Healthcare Providers: GravelBags.it This test is not yet approved or cleared by the [place] FDA and  has been authorized for detection and/or diagnosis of SARS-CoV-2 by  FDA under an Emergency Use Authorization (EUA). This EUA will remain  in effect (meaning this test can be used) for the duration of the  Covid-19 declaration under Section 564(b)(1) of the Act, 21  U.S.C. section 360bbb-3(b)(1), unless the authorization is  terminated or revoked. Performed at [facility], [place] [address], [place], [place] [address]   MRSA PCR Screening     Status: None   Collection Time: 06/23/19  7:11 PM   Specimen: Nasal Mucosa; Nasopharyngeal  Result Value Ref Range Status   MRSA by PCR NEGATIVE NEGATIVE Final    Comment:        The GeneXpert MRSA Assay (FDA approved for NASAL specimens only), is one component of a comprehensive MRSA colonization surveillance program. It is not intended to diagnose MRSA infection nor to guide or monitor treatment for MRSA infections. Performed at [facility], [place] [address], [place], [place] [address]      Labs: BNP (last 3 results) No results for input(s): BNP in the last 8760 hours. Basic Metabolic Panel: Recent Labs  Lab 06/21/19 2247 06/22/19 0554 06/23/19 1221 06/24/19 0218 06/25/19 0848  NA 141 143 138 138 135  K 4.4 4.3 4.1 3.4* 3.8  CL 106 108 104 106 105  CO2 25 24 24  20* 16*  GLUCOSE 109* 82 101* 93 89  BUN 24* 24* 23 18 16   CREATININE 1.94* 1.69* 1.33* 1.26* 1.12  CALCIUM 7.4* 7.7* 8.0* 7.8* 7.7*  MG 1.7  --  1.1* 1.2*  --   PHOS 3.6  --   --  1.9*  --    Liver Function Tests: Recent Labs  Lab 06/21/19 1800 06/21/19 2247 06/22/19 0554 06/23/19 1221 06/24/19 0218  AST 82* 72* 90* 58* 46*  ALT 67* 60* 62* 52* 46*  ALKPHOS 103  103 99 92 87  BILITOT 0.7 0.6 0.8 1.0 1.4*  PROT 6.4* 5.7* 5.4* 5.3* 5.7*  ALBUMIN 3.5 3.1* 2.9* 3.0* 3.2*   No results for input(s): LIPASE, AMYLASE in the last 168 hours. Recent Labs  Lab 06/23/19 1311  AMMONIA 39*   CBC: Recent Labs  Lab 06/21/19 1800 06/22/19 0554 06/23/19 1214 06/24/19 0218 06/25/19 0848  WBC 4.9 4.4 4.0 5.3 5.1  NEUTROABS 3.5  --   --   --   --   HGB 11.9* 10.2* 10.4* 10.3* 11.0*  HCT 35.4* 31.3* 32.3* 32.1* 33.5*  MCV 95.2 96.9 98.2 98.5 99.1  PLT 102* 75* 64* 59* 75*   Cardiac Enzymes: Recent Labs  Lab 06/21/19 1926  CKTOTAL 88   BNP: Invalid input(s): POCBNP CBG: Recent Labs  Lab 06/23/19 1152  GLUCAP 97   D-Dimer No results for input(s): DDIMER in the last 72  hours. Hgb A1c No results for input(s): HGBA1C in the last 72 hours. Lipid Profile No results for input(s): CHOL, HDL, LDLCALC, TRIG, CHOLHDL, LDLDIRECT in the last 72 hours. Thyroid function studies No results for input(s): TSH, T4TOTAL, T3FREE, THYROIDAB in the last 72 hours.  Invalid input(s): FREET3 Anemia work up No results for input(s): VITAMINB12, FOLATE, FERRITIN, TIBC, IRON, RETICCTPCT in the last 72 hours. Urinalysis    Component Value Date/Time   COLORURINE YELLOW 11/24/2018 0445   APPEARANCEUR CLOUDY (A) 11/24/2018 0445   LABSPEC 1.014 11/24/2018 0445   PHURINE 5.0 11/24/2018 0445   GLUCOSEU NEGATIVE 11/24/2018 0445   HGBUR SMALL (A) 11/24/2018 0445   BILIRUBINUR NEGATIVE 11/24/2018 0445   KETONESUR NEGATIVE 11/24/2018 0445   PROTEINUR 30 (A) 11/24/2018 0445   NITRITE NEGATIVE 11/24/2018 0445   LEUKOCYTESUR LARGE (A) 11/24/2018 0445   Sepsis Labs Invalid input(s): PROCALCITONIN,  WBC,  LACTICIDVEN Microbiology Recent Results (from the past 240 hour(s))  Respiratory Panel by RT PCR (Flu A&B, Covid) - Nasopharyngeal Swab     Status: None   Collection Time: 06/21/19  8:49 PM   Specimen: Nasopharyngeal Swab  Result Value Ref Range Status   SARS  Coronavirus 2 by RT PCR NEGATIVE NEGATIVE Final    Comment: (NOTE) SARS-CoV-2 target nucleic acids are NOT DETECTED. The SARS-CoV-2 RNA is generally detectable in upper respiratoy specimens during the acute phase of infection. The lowest concentration of SARS-CoV-2 viral copies this assay can detect is 131 copies/mL. A negative result does not preclude SARS-Cov-2 infection and should not be used as the sole basis for treatment or other patient management decisions. A negative result may occur with  improper specimen collection/handling, submission of specimen other than nasopharyngeal swab, presence of viral mutation(s) within the areas targeted by this assay, and inadequate number of viral copies (<131 copies/mL). A negative result must be combined with clinical observations, patient history, and epidemiological information. The expected result is Negative. Fact Sheet for Patients:  PinkCheek.be Fact Sheet for Healthcare Providers:  GravelBags.it This test is not yet ap proved or cleared by the [place] FDA and  has been authorized for detection and/or diagnosis of SARS-CoV-2 by FDA under an Emergency Use Authorization (EUA). This EUA will remain  in effect (meaning this test can be used) for the duration of the COVID-19 declaration under Section 564(b)(1) of the Act, 21 U.S.C. section 360bbb-3(b)(1), unless the authorization is terminated or revoked sooner.    Influenza A by PCR NEGATIVE NEGATIVE Final   Influenza B by PCR NEGATIVE NEGATIVE Final    Comment: (NOTE) The Xpert Xpress SARS-CoV-2/FLU/RSV assay is intended as an aid in  the diagnosis of influenza from Nasopharyngeal swab specimens and  should not be used as a sole basis for treatment. Nasal washings and  aspirates are unacceptable for Xpert Xpress SARS-CoV-2/FLU/RSV  testing. Fact Sheet for Patients: PinkCheek.be Fact Sheet  for Healthcare Providers: GravelBags.it This test is not yet approved or cleared by the [place] FDA and  has been authorized for detection and/or diagnosis of SARS-CoV-2 by  FDA under an Emergency Use Authorization (EUA). This EUA will remain  in effect (meaning this test can be used) for the duration of the  Covid-19 declaration under Section 564(b)(1) of the Act, 21  U.S.C. section 360bbb-3(b)(1), unless the authorization is  terminated or revoked. Performed at [facility], [place] [address] [place], [place] [address]   MRSA PCR Screening     Status: None   Collection Time:  06/23/19  7:11 PM   Specimen: Nasal Mucosa; Nasopharyngeal  Result Value Ref Range Status   MRSA by PCR NEGATIVE NEGATIVE Final    Comment:        The GeneXpert MRSA Assay (FDA approved for NASAL specimens only), is one component of a comprehensive MRSA colonization surveillance program. It is not intended to diagnose MRSA infection nor to guide or monitor treatment for MRSA infections. Performed at [facility], [place] [address], [place],  [address]      Time coordinating discharge: 35 minutes  SIGNED:   Cordelia Poche, MD [facility] 06/27/2019, 1:00 PM

## 2019-06-27 NOTE — Progress Notes (Signed)
Physical Therapy Treatment Patient Details Name: Ruben [name] MRN: [id number] DOB: 22-Jan-1951 Today's Date: 06/27/2019    History of Present Illness 69 yo male admitted with ETOH intoxication/withdrawal symptoms. Hx of ETOH abuse, COVID, OA    PT Comments    Pt assisted with ambulating in hallway and presents with improved cognition and stability today.  Pt still mildly unsteady however improved with ambulation distance.  HR also improved today: 75 bpm at rest, 101 bpm during ambulation, 78 bpm end of session.    Follow Up Recommendations  Supervision/Assistance - 24 hour;Home health PT     Equipment Recommendations  Rolling walker with 5" wheels    Recommendations for Other Services       Precautions / Restrictions Precautions Precautions: Fall Precaution Comments: monitor HR    Mobility  Bed Mobility Overal bed mobility: Needs Assistance Bed Mobility: Supine to Sit     Supine to sit: Supervision     General bed mobility comments: increased time and effort  Transfers Overall transfer level: Needs assistance Equipment used: None Transfers: Sit to/from Stand Sit to Stand: Min guard         General transfer comment: min/guard for safety  Ambulation/Gait Ambulation/Gait assistance: Min guard Gait Distance (Feet): 800 Feet Assistive device: None Gait Pattern/deviations: Step-through pattern;Decreased stride length;Wide base of support     General Gait Details: mildly unsteady initially however improved with distance, slightly wider BOS today likely to assist pt with stability, no overt LOB observed   Stairs             Wheelchair Mobility    Modified Rankin (Stroke Patients Only)       Balance Overall balance assessment: Needs assistance         Standing balance support: No upper extremity supported Standing balance-Leahy Scale: Fair                              Cognition Arousal/Alertness: Awake/alert Behavior During  Therapy: WFL for tasks assessed/performed Overall Cognitive Status: Within Functional Limits for tasks assessed                                 General Comments: cognition appears improved today, pt following commands well      Exercises      General Comments        Pertinent Vitals/Pain Pain Assessment: No/denies pain    Home Living                      Prior Function            PT Goals (current goals can now be found in the care plan section) Progress towards PT goals: Progressing toward goals    Frequency    Min 3X/week      PT Plan Current plan remains appropriate    Co-evaluation              AM-PAC PT "6 Clicks" Mobility   Outcome Measure  Help needed turning from your back to your side while in a flat bed without using bedrails?: A Little Help needed moving from lying on your back to sitting on the side of a flat bed without using bedrails?: A Little Help needed moving to and from a bed to a chair (including a wheelchair)?: A Little Help needed standing up from a chair using  your arms (e.g., wheelchair or bedside chair)?: A Little Help needed to walk in hospital room?: A Little Help needed climbing 3-5 steps with a railing? : A Little 6 Click Score: 18    End of Session Equipment Utilized During Treatment: Gait belt Activity Tolerance: Patient tolerated treatment well Patient left: in chair;with call bell/phone within reach;with chair alarm set Nurse Communication: Mobility status PT Visit Diagnosis: Unsteadiness on feet (R26.81);Difficulty in walking, not elsewhere classified (R26.2)     Time: [id number] PT Time Calculation (min) (ACUTE ONLY): 18 min  Charges:  $Gait Training: 8-22 mins                     Arlyce Dice, DPT Acute Rehabilitation Services Office: [phone]  York Ram E 06/27/2019, 1:02 PM

## 2019-06-27 NOTE — TOC Progression Note (Signed)
Transition of Care [facility]) - Progression Note    Patient Details  Name: EZE TONTHAT MRN: [id number] Date of Birth: [birth year]  Transition of Care [facility] CM/SW Contact  Purcell Mouton, RN Phone Number: 06/27/2019, 1:39 PM  Clinical Narrative:    Spoke with pt [facility] from [place], [place] will follow pt at home. RW was ordered was placed and ordered from for pt from [place].    Expected Discharge Plan: [place] Barriers to Discharge: Continued Medical Work up  Expected Discharge Plan and Services Expected Discharge Plan: [place]   Discharge Planning Services: CM Consult   Living arrangements for the past 2 months: Single Family Home Expected Discharge Date: 06/27/19               DME Arranged: N/A DME Agency: NA       HH Arranged: NA HH Agency: NA         Social Determinants of Health (SDOH) Interventions    Readmission Risk Interventions No flowsheet data found.

## 2019-07-18 DIAGNOSIS — M25511 Pain in right shoulder: Secondary | ICD-10-CM | POA: Diagnosis not present

## 2019-07-23 DIAGNOSIS — M6281 Muscle weakness (generalized): Secondary | ICD-10-CM | POA: Diagnosis not present

## 2019-07-23 DIAGNOSIS — M79621 Pain in right upper arm: Secondary | ICD-10-CM | POA: Diagnosis not present

## 2019-07-23 DIAGNOSIS — S4351XD Sprain of right acromioclavicular joint, subsequent encounter: Secondary | ICD-10-CM | POA: Diagnosis not present

## 2019-07-23 DIAGNOSIS — M25511 Pain in right shoulder: Secondary | ICD-10-CM | POA: Diagnosis not present

## 2019-07-23 DIAGNOSIS — M25611 Stiffness of right shoulder, not elsewhere classified: Secondary | ICD-10-CM | POA: Diagnosis not present

## 2019-07-24 DIAGNOSIS — L300 Nummular dermatitis: Secondary | ICD-10-CM | POA: Diagnosis not present

## 2019-07-25 DIAGNOSIS — M6281 Muscle weakness (generalized): Secondary | ICD-10-CM | POA: Diagnosis not present

## 2019-07-25 DIAGNOSIS — S4351XD Sprain of right acromioclavicular joint, subsequent encounter: Secondary | ICD-10-CM | POA: Diagnosis not present

## 2019-07-25 DIAGNOSIS — M25511 Pain in right shoulder: Secondary | ICD-10-CM | POA: Diagnosis not present

## 2019-07-25 DIAGNOSIS — M79621 Pain in right upper arm: Secondary | ICD-10-CM | POA: Diagnosis not present

## 2019-07-25 DIAGNOSIS — M25611 Stiffness of right shoulder, not elsewhere classified: Secondary | ICD-10-CM | POA: Diagnosis not present

## 2019-07-30 DIAGNOSIS — R220 Localized swelling, mass and lump, head: Secondary | ICD-10-CM | POA: Diagnosis not present

## 2019-07-30 DIAGNOSIS — R000 Tachycardia, unspecified: Secondary | ICD-10-CM | POA: Diagnosis not present

## 2019-07-30 DIAGNOSIS — F10139 Alcohol abuse with withdrawal, unspecified: Secondary | ICD-10-CM | POA: Diagnosis not present

## 2019-07-30 DIAGNOSIS — E872 Acidosis: Secondary | ICD-10-CM | POA: Diagnosis not present

## 2019-07-30 DIAGNOSIS — I10 Essential (primary) hypertension: Secondary | ICD-10-CM | POA: Diagnosis not present

## 2019-07-30 DIAGNOSIS — S0990XA Unspecified injury of head, initial encounter: Secondary | ICD-10-CM | POA: Diagnosis not present

## 2019-07-31 DIAGNOSIS — F10139 Alcohol abuse with withdrawal, unspecified: Secondary | ICD-10-CM | POA: Diagnosis not present

## 2019-07-31 DIAGNOSIS — K7010 Alcoholic hepatitis without ascites: Secondary | ICD-10-CM | POA: Diagnosis not present

## 2019-07-31 DIAGNOSIS — I10 Essential (primary) hypertension: Secondary | ICD-10-CM | POA: Diagnosis not present

## 2019-07-31 DIAGNOSIS — E872 Acidosis: Secondary | ICD-10-CM | POA: Diagnosis not present

## 2019-08-01 DIAGNOSIS — K7010 Alcoholic hepatitis without ascites: Secondary | ICD-10-CM | POA: Diagnosis not present

## 2019-08-01 DIAGNOSIS — F10139 Alcohol abuse with withdrawal, unspecified: Secondary | ICD-10-CM | POA: Diagnosis not present

## 2019-08-01 DIAGNOSIS — I10 Essential (primary) hypertension: Secondary | ICD-10-CM | POA: Diagnosis not present

## 2019-08-01 DIAGNOSIS — E872 Acidosis: Secondary | ICD-10-CM | POA: Diagnosis not present

## 2019-08-01 MED ORDER — LORAZEPAM 2 MG/ML IJ SOLN
1.00 | INTRAMUSCULAR | Status: DC
Start: ? — End: 2019-08-01

## 2019-08-01 MED ORDER — GENERIC EXTERNAL MEDICATION
Status: DC
Start: ? — End: 2019-08-01

## 2019-08-01 MED ORDER — ONDANSETRON HCL 4 MG/2ML IJ SOLN
4.00 | INTRAMUSCULAR | Status: DC
Start: ? — End: 2019-08-01

## 2019-08-01 MED ORDER — POTASSIUM CHLORIDE CRYS ER 20 MEQ PO TBCR
40.00 | EXTENDED_RELEASE_TABLET | ORAL | Status: DC
Start: 2019-08-01 — End: 2019-08-01

## 2019-08-01 MED ORDER — GENERIC EXTERNAL MEDICATION
1.00 | Status: DC
Start: ? — End: 2019-08-01

## 2019-08-01 MED ORDER — GENERIC EXTERNAL MEDICATION
Status: DC
Start: 2019-08-02 — End: 2019-08-01

## 2019-08-01 MED ORDER — FOLIC ACID 1 MG PO TABS
1.00 | ORAL_TABLET | ORAL | Status: DC
Start: 2019-08-02 — End: 2019-08-01

## 2019-08-01 MED ORDER — GABAPENTIN 300 MG PO CAPS
300.00 | ORAL_CAPSULE | ORAL | Status: DC
Start: 2019-08-01 — End: 2019-08-01

## 2019-08-01 MED ORDER — QUINTABS PO TABS
1.00 | ORAL_TABLET | ORAL | Status: DC
Start: 2019-08-02 — End: 2019-08-01

## 2019-08-01 MED ORDER — ATORVASTATIN CALCIUM 10 MG PO TABS
20.00 | ORAL_TABLET | ORAL | Status: DC
Start: 2019-08-01 — End: 2019-08-01

## 2019-08-01 MED ORDER — THIAMINE HCL 100 MG PO TABS
100.00 | ORAL_TABLET | ORAL | Status: DC
Start: 2019-08-02 — End: 2019-08-01

## 2019-09-01 ENCOUNTER — Emergency Department (HOSPITAL_COMMUNITY)
Admission: EM | Admit: 2019-09-01 | Discharge: 2019-09-01 | Disposition: A | Discharge status: Home / Self Care | Payer: Medicare Other | Attending: Emergency Medicine | Admitting: Emergency Medicine

## 2019-09-01 ENCOUNTER — Ambulatory Visit (HOSPITAL_COMMUNITY)
Admission: AD | Admit: 2019-09-01 | Discharge: 2019-09-01 | Disposition: A | Discharge status: Home / Self Care | Payer: Medicare Other | Source: Home / Self Care | Attending: Psychiatry | Admitting: Psychiatry

## 2019-09-01 ENCOUNTER — Other Ambulatory Visit: Payer: Self-pay

## 2019-09-01 ENCOUNTER — Encounter (HOSPITAL_COMMUNITY): Payer: Self-pay

## 2019-09-01 DIAGNOSIS — I10 Essential (primary) hypertension: Secondary | ICD-10-CM | POA: Diagnosis not present

## 2019-09-01 DIAGNOSIS — Y900 Blood alcohol level of less than 20 mg/100 ml: Secondary | ICD-10-CM | POA: Insufficient documentation

## 2019-09-01 DIAGNOSIS — R000 Tachycardia, unspecified: Secondary | ICD-10-CM | POA: Diagnosis not present

## 2019-09-01 DIAGNOSIS — F419 Anxiety disorder, unspecified: Secondary | ICD-10-CM | POA: Insufficient documentation

## 2019-09-01 DIAGNOSIS — R251 Tremor, unspecified: Secondary | ICD-10-CM | POA: Insufficient documentation

## 2019-09-01 DIAGNOSIS — Z79899 Other long term (current) drug therapy: Secondary | ICD-10-CM | POA: Diagnosis not present

## 2019-09-01 DIAGNOSIS — Z7289 Other problems related to lifestyle: Secondary | ICD-10-CM | POA: Insufficient documentation

## 2019-09-01 DIAGNOSIS — Z1330 Encounter for screening examination for mental health and behavioral disorders, unspecified: Secondary | ICD-10-CM | POA: Insufficient documentation

## 2019-09-01 DIAGNOSIS — F1010 Alcohol abuse, uncomplicated: Secondary | ICD-10-CM

## 2019-09-01 DIAGNOSIS — G4700 Insomnia, unspecified: Secondary | ICD-10-CM | POA: Diagnosis not present

## 2019-09-01 DIAGNOSIS — Z046 Encounter for general psychiatric examination, requested by authority: Secondary | ICD-10-CM | POA: Diagnosis not present

## 2019-09-01 DIAGNOSIS — F1020 Alcohol dependence, uncomplicated: Secondary | ICD-10-CM | POA: Diagnosis not present

## 2019-09-01 LAB — URINALYSIS, ROUTINE W REFLEX MICROSCOPIC
Bilirubin Urine: NEGATIVE
Glucose, UA: NEGATIVE mg/dL
Hgb urine dipstick: NEGATIVE
Ketones, ur: 5 mg/dL — AB
Leukocytes,Ua: NEGATIVE
Nitrite: NEGATIVE
Protein, ur: NEGATIVE mg/dL
Specific Gravity, Urine: 1.015 (ref 1.005–1.030)
pH: 5 (ref 5.0–8.0)

## 2019-09-01 LAB — COMPREHENSIVE METABOLIC PANEL
ALT: 75 U/L — ABNORMAL HIGH (ref 0–44)
AST: 51 U/L — ABNORMAL HIGH (ref 15–41)
Albumin: 4.1 g/dL (ref 3.5–5.0)
Alkaline Phosphatase: 63 U/L (ref 38–126)
Anion gap: 10 (ref 5–15)
BUN: 17 mg/dL (ref 8–23)
CO2: 26 mmol/L (ref 22–32)
Calcium: 9.3 mg/dL (ref 8.9–10.3)
Chloride: 101 mmol/L (ref 98–111)
Creatinine, Ser: 1.17 mg/dL (ref 0.61–1.24)
GFR calc Af Amer: >60 mL/min (ref >60)
GFR calc non Af Amer: >60 mL/min (ref >60)
Glucose, Bld: 99 mg/dL (ref 70–99)
Potassium: 4.4 mmol/L (ref 3.5–5.1)
Sodium: 137 mmol/L (ref 135–145)
Total Bilirubin: 0.7 mg/dL (ref 0.3–1.2)
Total Protein: 7 g/dL (ref 6.5–8.1)

## 2019-09-01 LAB — CBC WITH DIFFERENTIAL/PLATELET
Abs Immature Granulocytes: 0.01 10*3/uL (ref 0.00–0.07)
Basophils Absolute: 0.1 10*3/uL (ref 0.0–0.1)
Basophils Relative: 1 %
Eosinophils Absolute: 0 10*3/uL (ref 0.0–0.5)
Eosinophils Relative: 1 %
HCT: 39.7 % (ref 39.0–52.0)
Hemoglobin: 13.4 g/dL (ref 13.0–17.0)
Immature Granulocytes: 0 %
Lymphocytes Relative: 17 %
Lymphs Abs: 1 10*3/uL (ref 0.7–4.0)
MCH: 33.9 pg (ref 26.0–34.0)
MCHC: 33.8 g/dL (ref 30.0–36.0)
MCV: 100.5 fL — ABNORMAL HIGH (ref 80.0–100.0)
Monocytes Absolute: 0.9 10*3/uL (ref 0.1–1.0)
Monocytes Relative: 16 %
Neutro Abs: 3.7 10*3/uL (ref 1.7–7.7)
Neutrophils Relative %: 65 %
Platelets: 154 10*3/uL (ref 150–400)
RBC: 3.95 MIL/uL — ABNORMAL LOW (ref 4.22–5.81)
RDW: 13.2 % (ref 11.5–15.5)
WBC: 5.7 10*3/uL (ref 4.0–10.5)
nRBC: 0 % (ref 0.0–0.2)

## 2019-09-01 LAB — ETHANOL: Alcohol, Ethyl (B): <10 mg/dL (ref <10)

## 2019-09-01 LAB — RAPID URINE DRUG SCREEN, HOSP PERFORMED
Amphetamines: NOT DETECTED
Barbiturates: NOT DETECTED
Benzodiazepines: POSITIVE — AB
Cocaine: NOT DETECTED
Opiates: NOT DETECTED
Tetrahydrocannabinol: NOT DETECTED

## 2019-09-01 LAB — T4, FREE: Free T4: 0.96 ng/dL (ref 0.61–1.12)

## 2019-09-01 LAB — TSH: TSH: 3.531 u[IU]/mL (ref 0.350–4.500)

## 2019-09-01 MED ORDER — LORAZEPAM 1 MG PO TABS
2.0000 mg | ORAL_TABLET | Freq: Once | ORAL | Status: AC
  Administered 2019-09-01: 2 mg via ORAL
  Filled 2019-09-01: qty 2

## 2019-09-01 MED ORDER — SODIUM CHLORIDE 0.9 % IV BOLUS
1000.0000 mL | Freq: Once | INTRAVENOUS | Status: AC
  Administered 2019-09-01: 1000 mL via INTRAVENOUS

## 2019-09-01 MED ORDER — CHLORDIAZEPOXIDE HCL 25 MG PO CAPS
ORAL_CAPSULE | ORAL | 0 refills | Status: DC
Start: 2019-09-01 — End: 2019-10-06

## 2019-09-01 NOTE — H&P (Signed)
Behavioral Health Medical Screening Exam  Ruben [name] is an 69 y.o. male with history of alcohol use disorder. He is presenting today for detox. He denies SI/HI/AVH.   Per chart review, patient has history of requiring medical hospitalizations for alcohol withdrawal, with admission for metabolic acidosis last month. Patient appears flushed with pulse of 158.   Total Time spent with patient: 15 minutes  Psychiatric Specialty Exam: Physical Exam  Nursing note and vitals reviewed. Constitutional: He is oriented to person, place, and time. He appears well-developed and well-nourished.  Cardiovascular:  tachycardic  Respiratory: Effort normal.  Neurological: He is alert and oriented to person, place, and time.    Review of Systems  Constitutional: Negative.   Psychiatric/Behavioral: Negative for agitation, behavioral problems, confusion, dysphoric mood, hallucinations, self-injury and suicidal ideas. The patient is not nervous/anxious and is not hyperactive.     Blood pressure 97/81, pulse (!) 158, temperature 98.2 F (36.8 C), temperature source Oral, resp. rate 16, SpO2 98 %.There is no height or weight on file to calculate BMI.  General Appearance: Casual  Eye Contact:  Good  Speech:  Normal Rate  Volume:  Normal  Mood:  Euthymic  Affect:  Appropriate and Congruent  Thought Process:  Coherent  Orientation:  Full (Time, Place, and Person)  Thought Content:  Logical  Suicidal Thoughts:  No  Homicidal Thoughts:  No  Memory:  Immediate;   Fair Recent;   Fair  Judgement:  Intact  Insight:  Fair  Psychomotor Activity:  Normal  Concentration: Concentration: Fair and Attention Span: Fair  Recall:  [employer] of Knowledge:Fair  Language: Good  Akathisia:  No  Handed:  Right  AIMS (if indicated):     Assets:  Communication Skills Desire for Improvement Financial Resources/Insurance Housing  Sleep:       Musculoskeletal: Strength & Muscle Tone: within normal limits Gait &  Station: normal Patient leans: N/A  Blood pressure 97/81, pulse (!) 158, temperature 98.2 F (36.8 C), temperature source Oral, resp. rate 16, SpO2 98 %.  Recommendations:  Based on my evaluation the patient appears to have an emergency medical condition for which I recommend the patient be transferred to the emergency department for further evaluation.  Patient does not meet criteria for inpatient psychiatric hospitalization. Recommend medical screening with labwork and referrals for substance use treatment.  Connye Burkitt, NP 09/01/2019, 2:16 PM

## 2019-09-01 NOTE — ED Notes (Signed)
Pt aware urine sample is needed 

## 2019-09-01 NOTE — ED Notes (Signed)
Pt alert, calm and cooperative.  Pt denies SI/HI/AVH.

## 2019-09-01 NOTE — ED Provider Notes (Signed)
[facility] DEPT Provider Note   CSN: [id number] Arrival date & time: 09/01/19  1445     History Chief Complaint  Patient presents with  . Medical Clearance    Ruben [name] is a 69 y.o. male.  HPI      69yo male with history of alcoholism, hypertension, hypercholesterolemia presents with concern for tachycardia from [facility] where he presented with concern for alcohol abuse and desire for treatment.    Has history of elevated heart rate, did go to a Cardiologist, had numerous tests run because of it, not put on medicine, told it was due to drinking.  At night HR can get down 85, during day it runs in 100s.  Last drink was 3 days ago. Has not noted significant withdrawal symptoms--with exception of anxiety.  Began to drink heavier due to anxiety and difficulty sleeping.  Has not seen psychiatrist about symptoms, did discuss with PCP but not on any medications for anxiety.   Times where can't sleep and drinking more don't last long typically.  Drink every day, 12-15oz vodka.  No hx of withdrawal seizures, has had tremors.  No thoughts of SI or HI. Notes inadvertent self harm related to drinking.  No fevers, cough, CP, dyspnea. No vomiting or diarrhea, no black or bloody stools. Constipation.  No recent changes in medication, no hx of thyroid disease.   Has been rx metoprolol but not taking it.     Past Medical History:  Diagnosis Date  . Alcoholism ([place])   . Aortic atherosclerosis ([place]) 12/26/2016   Noted on CT  . Arthritis   . BPH (benign prostatic hyperplasia)   . Chronic constipation    takes magnesium  . Duodenal ulcer   . Dyspnea 12/17/2017  . ED (erectile dysfunction)   . Foley catheter in place    history of no longer in place  . GERD (gastroesophageal reflux disease)   . Heart palpitations    Occ  . Hiatal hernia   . History of adenomatous polyp of colon    tubular adenoma's 2005  . History of Helicobacter pylori infection    2008    . Hypercholesteremia   . Hypertension   . MR (mitral regurgitation)    Mild, noted on ECHO  . Pre-diabetes   . Pulmonary nodule 12/26/2016   noted on CT, 5 x 3 mm nodular opacity right upper lobe  . Rosacea   . Schatzki's ring    last dilated 02/ 2016  . Urinary retention    history of  . Varicocele 02/25/2014   Small to moderate left varicocele    Patient Active Problem List   Diagnosis Date Noted  . GERD (gastroesophageal reflux disease) 06/22/2019  . BPH (benign prostatic hyperplasia) 06/22/2019  . Essential hypertension 06/22/2019  . AKI (acute kidney injury) ([place])   . Alcohol intoxication ([place]) 06/21/2019  . S/P Nissen fundoplication (without gastrostomy tube) procedure 04/26/2018    Past Surgical History:  Procedure Laterality Date  . BALLOON DILATION N/A 06/08/2014   Procedure: BALLOON DILATION;  Surgeon: Garlan Fair, MD;  Location: [facility];  Service: Endoscopy;  Laterality: N/A;  . COLONOSCOPY WITH PROPOFOL N/A 06/08/2014   Procedure: COLONOSCOPY WITH PROPOFOL;  Surgeon: Garlan Fair, MD;  Location: [facility];  Service: Endoscopy;  Laterality: N/A;  . ESOPHAGOGASTRODUODENOSCOPY N/A 06/08/2014   Procedure: ESOPHAGOGASTRODUODENOSCOPY (EGD);  Surgeon: Garlan Fair, MD;  Location: [facility];  Service: Endoscopy;  Laterality: N/A;  . ESOPHAGOGASTRODUODENOSCOPY (EGD) WITH  ESOPHAGEAL DILATION N/A 12/31/2012   Procedure: ESOPHAGOGASTRODUODENOSCOPY (EGD) WITH ESOPHAGEAL DILATION;  Surgeon: Garlan Fair, MD;  Location: [facility];  Service: Endoscopy;  Laterality: N/A;  . GREEN LIGHT LASER TURP (TRANSURETHRAL RESECTION OF PROSTATE N/A 12/28/2015   Procedure: GREEN LIGHT LASER TURP (TRANSURETHRAL RESECTION OF PROSTATE;  Surgeon: Festus Aloe, MD;  Location: [facility];  Service: Urology;  Laterality: N/A;       Family History  Problem Relation Age of Onset  . Heart failure Mother   . Stroke Father     Social History    Tobacco Use  . Smoking status: Never Smoker  . Smokeless tobacco: Never Used  Substance Use Topics  . Alcohol use: Yes  . Drug use: No    Home Medications Prior to Admission medications   Medication Sig Start Date End Date Taking? Authorizing Provider  atorvastatin (LIPITOR) 20 MG tablet Take 20 mg by mouth daily.   Yes [provider]  folic acid (FOLVITE) 1 MG tablet Take 1 tablet (1 mg total) by mouth daily. 06/28/19  Yes Mariel Aloe, MD  lisinopril-hydrochlorothiazide (PRINZIDE,ZESTORETIC) 20-12.5 MG per tablet Take 2 tablets by mouth daily.    Yes [provider]  magnesium hydroxide (MILK OF MAGNESIA) 400 MG/5ML suspension Take 15 mLs by mouth daily as needed for mild constipation.   Yes [provider]  Multiple Vitamin (MULTIVITAMIN WITH MINERALS) TABS tablet Take 1 tablet by mouth daily. 06/28/19  Yes Mariel Aloe, MD  chlordiazePOXIDE (LIBRIUM) 25 MG capsule 50mg  PO TID x 1D, then 25-50mg  PO BID X 1D, then 25-50mg  PO QD X 1D 09/01/19   Gareth Morgan, MD  docusate sodium (COLACE) 100 MG capsule Take 1 capsule (100 mg total) by mouth 2 (two) times daily. Ok to stop taking if having loose bowel movements Patient not taking: Reported on 09/01/2019 04/28/18   Clovis Riley, MD  metoprolol tartrate (LOPRESSOR) 25 MG tablet Take 1 tablet (25 mg total) by mouth 2 (two) times daily. Patient not taking: Reported on 09/01/2019 06/27/19   Mariel Aloe, MD  thiamine 100 MG tablet Take 1 tablet (100 mg total) by mouth daily. Patient not taking: Reported on 09/01/2019 06/28/19   Mariel Aloe, MD    Allergies    Patient has no known allergies.  Review of Systems   Review of Systems  Constitutional: Positive for appetite change and unexpected weight change. Negative for fever.  HENT: Negative for sore throat.   Eyes: Negative for visual disturbance.  Respiratory: Negative for cough and shortness of breath.   Cardiovascular: Negative for chest pain.   Gastrointestinal: Positive for constipation. Negative for abdominal pain, diarrhea, nausea and vomiting.  Genitourinary: Negative for difficulty urinating.  Musculoskeletal: Negative for back pain and neck stiffness.  Skin: Negative for rash.  Neurological: Negative for syncope and headaches.  Psychiatric/Behavioral: Positive for self-injury and sleep disturbance. Negative for hallucinations and suicidal ideas. The patient is nervous/anxious.     Physical Exam Updated Vital Signs BP 132/86 (BP Location: Left Arm)   Pulse 88   Temp 98.3 F (36.8 C) (Oral)   Resp 16   Ht 5\' 11"  (1.803 m)   Wt 78.5 kg   SpO2 98%   BMI 24.13 kg/m   Physical Exam Vitals and nursing note reviewed.  Constitutional:      General: He is not in acute distress.    Appearance: He is well-developed. He is not diaphoretic.  HENT:  Head: Normocephalic and atraumatic.  Eyes:     Conjunctiva/sclera: Conjunctivae normal.  Cardiovascular:     Rate and Rhythm: Regular rhythm. Tachycardia present.     Heart sounds: Normal heart sounds. No murmur. No friction rub. No gallop.   Pulmonary:     Effort: Pulmonary effort is normal. No respiratory distress.     Breath sounds: Normal breath sounds. No wheezing or rales.  Abdominal:     General: There is no distension.     Palpations: Abdomen is soft.     Tenderness: There is no abdominal tenderness. There is no guarding.  Musculoskeletal:     Cervical back: Normal range of motion.  Skin:    General: Skin is warm and dry.  Neurological:     Mental Status: He is alert and oriented to person, place, and time.     ED Results / Procedures / Treatments   Labs (all labs ordered are listed, but only abnormal results are displayed) Labs Reviewed  CBC WITH DIFFERENTIAL/PLATELET - Abnormal; Notable for the following components:      Result Value   RBC 3.95 (*)    MCV 100.5 (*)    All other components within normal limits  COMPREHENSIVE METABOLIC PANEL -  Abnormal; Notable for the following components:   AST 51 (*)    ALT 75 (*)    All other components within normal limits  RAPID URINE DRUG SCREEN, HOSP PERFORMED - Abnormal; Notable for the following components:   Benzodiazepines POSITIVE (*)    All other components within normal limits  URINALYSIS, ROUTINE W REFLEX MICROSCOPIC - Abnormal; Notable for the following components:   Ketones, ur 5 (*)    All other components within normal limits  TSH  ETHANOL  T4, FREE    EKG EKG Interpretation  Date/Time:  Monday Sep 01 2019 16:19:38 EDT Ventricular Rate:  74 PR Interval:    QRS Duration: 91 QT Interval:  389 QTC Calculation: 432 R Axis:   12 Text Interpretation: Sinus rhythm Atrial premature complex No significant change since last tracing Confirmed by Gareth Morgan [phone]) on 09/01/2019 7:34:33 PM   Radiology No results found.  Procedures Procedures (including critical care time)  Medications Ordered in ED Medications  sodium chloride 0.9 % bolus 1,000 mL (0 mLs Intravenous Stopped 09/01/19 1727)  LORazepam (ATIVAN) tablet 2 mg (2 mg Oral Given 09/01/19 1725)    ED Course  I have reviewed the triage vital signs and the nursing notes.  Pertinent labs & imaging results that were available during my care of the patient were reviewed by me and considered in my medical decision making (see chart for details).    MDM Rules/Calculators/A&P                       69yo male with history of alcoholism, hypertension, hypercholesterolemia presents with concern for tachycardia from [facility] where he presented with concern for alcohol abuse and desire for treatment.  Tachycardic to 158 and BH H, with continuing tachycardia on arrival to the emergency department to the 130s.  Labs show no anemia, no significant electrolyte abnormalities, normal TSH.  No history to suggest infection.  No history to suggest medication induced tachycardia.  Suspect tachycardia likely secondary to dehydration  as well as alcohol withdrawal.  No signs of significant alcohol withdrawal by history or exam to indicate need for inpatient medical treatment.  Gave 2 mg of oral Ativan and 1 L of normal saline with improvement  of tachycardia.  Consulted peers support and social work, patient was given resources for etoh rehab.  Given a prescription with Librium with discussion of the risks. Reviewed in [place] drug database. Patient discharged in stable condition with understanding of reasons to return.   Final Clinical Impression(s) / ED Diagnoses Final diagnoses:  Tachycardia  Alcohol abuse    Rx / DC Orders ED Discharge Orders         Ordered    chlordiazePOXIDE (LIBRIUM) 25 MG capsule     09/01/19 Fidela Salisbury, MD 09/01/19 1944

## 2019-09-01 NOTE — Progress Notes (Addendum)
CSW met with pt to offer resources, and as session progressed, pt accepted meeting schedules for area Alcoholics Anonymous and Narcotics Anonymous 12-Step meetings as well as inpatient/outpatient SA Tx resources.  CSW provided education to the pt as to the efficacy of 12-step programs for community support for those needing support in addition to or other than inpatient/outpatient treatment and provided pt with list of [facility] in [place] and educated the pt on positives to admission. Pt appreciated CSW's efforts and thanked the CSW.  EDP updated.  Please reconsult if future social work needs arise.  CSW signing off, as social work intervention is no longer needed.  Ruben [name]. Bob Daversa  MSW, LCSW, LCAS, CCS Transitions of Care Clinical Social Worker Care Coordination Department Ph: [phone]

## 2019-09-01 NOTE — ED Triage Notes (Signed)
Patient went to [facility] for alcohol abuse. Patient sent to Ed for medical clearance by [facility].  Patient's last drink was 3 days ago and drank 15 shots.  Patient denies any SI/HI, visual or auditory hallucinations.

## 2019-09-01 NOTE — BH Assessment (Addendum)
Assessment Note  Ruben [name] is an 69 y.o. male with a history of Alcohol Use Disorder, severe who presents voluntarily seeking treatment following a relapse 4 wks ago.  Patient denies SI, HI and AVH.  Patient states his family and friends have been concerned that he has been drinking daily again since his last detox admission to [facility] (d/c 07/30/19). Patient has difficulty expressing his treatment needs at this time and states "you tell me."  He feels efforts to seek detox and treatment have not been effective.  He states he has tried detox followed by the "14 meetings in 90 days and that hasn't helped."  Patient has a history of medical admissions for acute withdrawal, along with other medical issues to include lactic and metabolic acidosis.  Per the RN, he presents to [facility] with HR of 158.  SA treatment options were discussed with the recommendation for detox followed by residential treatment.  Patient is hesitant to consider residential treatment at this time, however he is open to detox and open to speaking with Peer Support about treatment options.    Per Harriett Sine, NP further medical screening/evaluation is recommended.  Patient does not meet inpatient criteria.  Peer Support consult recommended for referral to appropriate SA programs once medically cleared.   Diagnosis: F10.20 Alcohol Use Disorder, severe  Past Medical History:  Past Medical History:  Diagnosis Date  . Alcoholism ([place])   . Aortic atherosclerosis ([place]) 12/26/2016   Noted on CT  . Arthritis   . BPH (benign prostatic hyperplasia)   . Chronic constipation    takes magnesium  . Duodenal ulcer   . Dyspnea 12/17/2017  . ED (erectile dysfunction)   . Foley catheter in place    history of no longer in place  . GERD (gastroesophageal reflux disease)   . Heart palpitations    Occ  . Hiatal hernia   . History of adenomatous polyp of colon    tubular adenoma's 2005  . History of Helicobacter pylori  infection    2008  . Hypercholesteremia   . Hypertension   . MR (mitral regurgitation)    Mild, noted on ECHO  . Pre-diabetes   . Pulmonary nodule 12/26/2016   noted on CT, 5 x 3 mm nodular opacity right upper lobe  . Rosacea   . Schatzki's ring    last dilated 02/ 2016  . Urinary retention    history of  . Varicocele 02/25/2014   Small to moderate left varicocele    Past Surgical History:  Procedure Laterality Date  . BALLOON DILATION N/A 06/08/2014   Procedure: BALLOON DILATION;  Surgeon: Garlan Fair, MD;  Location: [facility];  Service: Endoscopy;  Laterality: N/A;  . COLONOSCOPY WITH PROPOFOL N/A 06/08/2014   Procedure: COLONOSCOPY WITH PROPOFOL;  Surgeon: Garlan Fair, MD;  Location: [facility];  Service: Endoscopy;  Laterality: N/A;  . ESOPHAGOGASTRODUODENOSCOPY N/A 06/08/2014   Procedure: ESOPHAGOGASTRODUODENOSCOPY (EGD);  Surgeon: Garlan Fair, MD;  Location: [facility];  Service: Endoscopy;  Laterality: N/A;  . ESOPHAGOGASTRODUODENOSCOPY (EGD) WITH ESOPHAGEAL DILATION N/A 12/31/2012   Procedure: ESOPHAGOGASTRODUODENOSCOPY (EGD) WITH ESOPHAGEAL DILATION;  Surgeon: Garlan Fair, MD;  Location: [facility];  Service: Endoscopy;  Laterality: N/A;  . GREEN LIGHT LASER TURP (TRANSURETHRAL RESECTION OF PROSTATE N/A 12/28/2015   Procedure: GREEN LIGHT LASER TURP (TRANSURETHRAL RESECTION OF PROSTATE;  Surgeon: Festus Aloe, MD;  Location: [facility];  Service: Urology;  Laterality: N/A;    Family  History:  Family History  Problem Relation Age of Onset  . Heart failure Mother   . Stroke Father     Social History:  reports that he has never smoked. He has never used smokeless tobacco. He reports current alcohol use. He reports that he does not use drugs.  Additional Social History:     CIWA: CIWA-Ar BP: (!) 138/99 Pulse Rate: (!) 131 Nausea and Vomiting: no nausea and no vomiting Tactile Disturbances: none Tremor: not visible, but  can be felt fingertip to fingertip Auditory Disturbances: not present Paroxysmal Sweats: no sweat visible Visual Disturbances: not present Anxiety: mildly anxious Headache, Fullness in Head: none present Agitation: normal activity Orientation and Clouding of Sensorium: oriented and can do serial additions CIWA-Ar Total: 2 COWS:    Allergies: No Known Allergies  Home Medications: (Not in a hospital admission) General Assessment Data Location of Assessment: [facility] TTS Assessment: In system Is this a Tele or Face-to-Face Assessment?: Face-to-Face Is this an Initial Assessment or a Re-assessment for this encounter?: Initial Assessment Patient Accompanied by:: N/A Language Other than English: No Living Arrangements: (Private home) What gender do you identify as?: Male Marital status: Single Maiden name: N/A Pregnancy Status: No Living Arrangements: Alone Can pt return to current living arrangement?: Yes Admission Status: Voluntary Is patient capable of signing voluntary admission?: Yes Referral Source: Self/Family/Friend Insurance type: MCR  Medical Screening Exam ([place]) Medical Exam completed: Yes  Crisis Care Plan Living Arrangements: Alone Legal Guardian: Other:(self) Name of Psychiatrist: None Name of Therapist: None  Education Status Is patient currently in school?: No Is the patient employed, unemployed or receiving disability?: (Retired)  Risk to self with the past 6 months Suicidal Ideation: No Has patient been a risk to self within the past 6 months prior to admission? : No Suicidal Intent: No Has patient had any suicidal intent within the past 6 months prior to admission? : No Is patient at risk for suicide?: No Suicidal Plan?: No Has patient had any suicidal plan within the past 6 months prior to admission? : No Access to Means: No What has been your use of drugs/alcohol within the last 12 months?: daily ETOH use for 4  wks Previous Attempts/Gestures: No How many times?: 0 Other Self Harm Risks: None Triggers for Past Attempts: Other (Comment)(N/A) Intentional Self Injurious Behavior: None Family Suicide History: No Recent stressful life event(s): (recent relapse) Persecutory voices/beliefs?: No Depression: Yes Depression Symptoms: Guilt, Feeling worthless/self pity Substance abuse history and/or treatment for substance abuse?: Yes Suicide prevention information given to non-admitted patients: Not applicable  Risk to Others within the past 6 months Homicidal Ideation: No Does patient have any lifetime risk of violence toward others beyond the six months prior to admission? : No Thoughts of Harm to Others: No Current Homicidal Intent: No Current Homicidal Plan: No Access to Homicidal Means: No Identified Victim: N/A History of harm to others?: No Assessment of Violence: None Noted Violent Behavior Description: N/A Does patient have access to weapons?: No Criminal Charges Pending?: No Does patient have a court date: No Is patient on probation?: No  Psychosis Hallucinations: None noted Delusions: None noted  Mental Status Report Appearance/Hygiene: Meticulous Eye Contact: Good Speech: Logical/coherent Level of Consciousness: Alert Mood: Depressed Affect: Depressed Anxiety Level: Minimal Thought Processes: Coherent, Relevant Judgement: Partial Orientation: Person, Place, Time, Situation Obsessive Compulsive Thoughts/Behaviors: None  Cognitive Functioning Memory: Recent Intact, Remote Intact Is patient IDD: No Insight: Fair Impulse Control: Poor Appetite: Fair Have you  had any weight changes? : No Change Sleep: Decreased Total Hours of Sleep: 7 Vegetative Symptoms: None  ADLScreening [facility] Assessment Services) Patient's cognitive ability adequate to safely complete daily activities?: Yes Patient able to express need for assistance with ADLs?: Yes Independently performs  ADLs?: Yes (appropriate for developmental age)  Prior Inpatient Therapy Prior Inpatient Therapy: Yes Prior Therapy Dates: 11/2018, 06/2019, 07/2019 Prior Therapy Facilty/Provider(s): [place], [facility] Reason for Treatment: Detox  Prior Outpatient Therapy Prior Outpatient Therapy: No Does patient have an ACCT team?: No Does patient have Intensive In-House Services?  : No Does patient have [facility] services? : No Does patient have P4CC services?: No  ADL Screening (condition at time of admission) Patient's cognitive ability adequate to safely complete daily activities?: Yes Is the patient deaf or have difficulty hearing?: No Does the patient have difficulty seeing, even when wearing glasses/contacts?: No Does the patient have difficulty concentrating, remembering, or making decisions?: No Patient able to express need for assistance with ADLs?: Yes Does the patient have difficulty dressing or bathing?: No Independently performs ADLs?: Yes (appropriate for developmental age) Does the patient have difficulty walking or climbing stairs?: No Weakness of Legs: None Weakness of Arms/Hands: None  Home Assistive Devices/Equipment Home Assistive Devices/Equipment: None  Therapy Consults (therapy consults require a physician order) PT Evaluation Needed: No OT Evalulation Needed: No SLP Evaluation Needed: No Abuse/Neglect Assessment (Assessment to be complete while patient is alone) Abuse/Neglect Assessment Can Be Completed: Yes Physical Abuse: Denies Verbal Abuse: Denies Sexual Abuse: Denies Exploitation of patient/patient's resources: Denies Self-Neglect: Denies Values / Beliefs Cultural Requests During Hospitalization: None Spiritual Requests During Hospitalization: None Consults Spiritual Care Consult Needed: No Transition of Care Team Consult Needed: No Advance Directives (For Healthcare) Does Patient Have a Medical Advance Directive?: No Would patient like  information on creating a medical advance directive?: No - Patient declined  OB/GYN Status:  No LMP for male patient.   Advance Directives (For Healthcare) Does Patient Have a Medical Advance Directive?: Yes Type of Advance Directive: Healthcare Power of Attorney, Living will Would patient like information on creating a medical advance directive?: No - Patient declined    Disposition: Per Harriett Sine, NP further medical screening/evaluation is recommended.  Patient does not meet inpatient criteria.  Peer Support consult recommended for referral to appropriate SA programs once medically cleared.     On Site Evaluation by:   Reviewed with Physician:    Fransico Meadow 09/01/2019 3:04 PM

## 2019-09-30 ENCOUNTER — Encounter (HOSPITAL_COMMUNITY): Payer: Self-pay | Admitting: Emergency Medicine

## 2019-09-30 DIAGNOSIS — I700 Atherosclerosis of aorta: Secondary | ICD-10-CM | POA: Diagnosis present

## 2019-09-30 DIAGNOSIS — N179 Acute kidney failure, unspecified: Secondary | ICD-10-CM | POA: Diagnosis present

## 2019-09-30 DIAGNOSIS — N400 Enlarged prostate without lower urinary tract symptoms: Secondary | ICD-10-CM | POA: Diagnosis present

## 2019-09-30 DIAGNOSIS — Z8249 Family history of ischemic heart disease and other diseases of the circulatory system: Secondary | ICD-10-CM

## 2019-09-30 DIAGNOSIS — F10239 Alcohol dependence with withdrawal, unspecified: Principal | ICD-10-CM | POA: Diagnosis present

## 2019-09-30 DIAGNOSIS — Y900 Blood alcohol level of less than 20 mg/100 ml: Secondary | ICD-10-CM | POA: Diagnosis present

## 2019-09-30 DIAGNOSIS — Z823 Family history of stroke: Secondary | ICD-10-CM

## 2019-09-30 DIAGNOSIS — Z20822 Contact with and (suspected) exposure to covid-19: Secondary | ICD-10-CM | POA: Diagnosis present

## 2019-09-30 DIAGNOSIS — I10 Essential (primary) hypertension: Secondary | ICD-10-CM | POA: Diagnosis present

## 2019-09-30 DIAGNOSIS — E538 Deficiency of other specified B group vitamins: Secondary | ICD-10-CM | POA: Diagnosis present

## 2019-09-30 DIAGNOSIS — K219 Gastro-esophageal reflux disease without esophagitis: Secondary | ICD-10-CM | POA: Diagnosis present

## 2019-09-30 DIAGNOSIS — D638 Anemia in other chronic diseases classified elsewhere: Secondary | ICD-10-CM | POA: Diagnosis present

## 2019-09-30 DIAGNOSIS — Z8711 Personal history of peptic ulcer disease: Secondary | ICD-10-CM

## 2019-09-30 DIAGNOSIS — E7800 Pure hypercholesterolemia, unspecified: Secondary | ICD-10-CM | POA: Diagnosis present

## 2019-09-30 DIAGNOSIS — G9341 Metabolic encephalopathy: Secondary | ICD-10-CM | POA: Diagnosis present

## 2019-09-30 DIAGNOSIS — R296 Repeated falls: Secondary | ICD-10-CM | POA: Diagnosis present

## 2019-09-30 DIAGNOSIS — Z79899 Other long term (current) drug therapy: Secondary | ICD-10-CM

## 2019-09-30 DIAGNOSIS — E871 Hypo-osmolality and hyponatremia: Secondary | ICD-10-CM | POA: Diagnosis present

## 2019-09-30 DIAGNOSIS — M6282 Rhabdomyolysis: Secondary | ICD-10-CM | POA: Diagnosis present

## 2019-09-30 DIAGNOSIS — T148XXA Other injury of unspecified body region, initial encounter: Secondary | ICD-10-CM | POA: Diagnosis present

## 2019-09-30 DIAGNOSIS — E876 Hypokalemia: Secondary | ICD-10-CM | POA: Diagnosis not present

## 2019-09-30 DIAGNOSIS — R000 Tachycardia, unspecified: Secondary | ICD-10-CM | POA: Diagnosis present

## 2019-09-30 DIAGNOSIS — E785 Hyperlipidemia, unspecified: Secondary | ICD-10-CM | POA: Diagnosis present

## 2019-09-30 DIAGNOSIS — G629 Polyneuropathy, unspecified: Secondary | ICD-10-CM | POA: Diagnosis present

## 2019-09-30 DIAGNOSIS — D61818 Other pancytopenia: Secondary | ICD-10-CM | POA: Diagnosis present

## 2019-09-30 DIAGNOSIS — K760 Fatty (change of) liver, not elsewhere classified: Secondary | ICD-10-CM | POA: Diagnosis present

## 2019-09-30 NOTE — ED Triage Notes (Signed)
Patient here from home reporting fall today. States that he has been detoxing from alcohol and has "felt bad" since Saturday. Last drink Saturday.

## 2019-09-30 NOTE — ED Notes (Signed)
Reports left knee pain, but states "it is alright" I do not need an x-ray.

## 2019-10-01 ENCOUNTER — Inpatient Hospital Stay (HOSPITAL_COMMUNITY)
Admission: EM | Admit: 2019-10-01 | Discharge: 2019-10-06 | DRG: 896 | Disposition: A | Discharge status: Home / Self Care | Payer: Medicare Other | Attending: Internal Medicine | Admitting: Internal Medicine

## 2019-10-01 ENCOUNTER — Encounter (HOSPITAL_COMMUNITY): Payer: Self-pay | Admitting: Internal Medicine

## 2019-10-01 ENCOUNTER — Inpatient Hospital Stay (HOSPITAL_COMMUNITY): Payer: Medicare Other

## 2019-10-01 ENCOUNTER — Emergency Department (HOSPITAL_COMMUNITY): Payer: Medicare Other

## 2019-10-01 ENCOUNTER — Other Ambulatory Visit: Payer: Self-pay

## 2019-10-01 DIAGNOSIS — N179 Acute kidney failure, unspecified: Secondary | ICD-10-CM | POA: Diagnosis present

## 2019-10-01 DIAGNOSIS — Z823 Family history of stroke: Secondary | ICD-10-CM | POA: Diagnosis not present

## 2019-10-01 DIAGNOSIS — E7800 Pure hypercholesterolemia, unspecified: Secondary | ICD-10-CM | POA: Diagnosis present

## 2019-10-01 DIAGNOSIS — T148XXA Other injury of unspecified body region, initial encounter: Secondary | ICD-10-CM | POA: Diagnosis present

## 2019-10-01 DIAGNOSIS — Y900 Blood alcohol level of less than 20 mg/100 ml: Secondary | ICD-10-CM | POA: Diagnosis present

## 2019-10-01 DIAGNOSIS — I700 Atherosclerosis of aorta: Secondary | ICD-10-CM | POA: Diagnosis present

## 2019-10-01 DIAGNOSIS — F10230 Alcohol dependence with withdrawal, uncomplicated: Secondary | ICD-10-CM | POA: Diagnosis not present

## 2019-10-01 DIAGNOSIS — E871 Hypo-osmolality and hyponatremia: Secondary | ICD-10-CM | POA: Diagnosis present

## 2019-10-01 DIAGNOSIS — F10939 Alcohol use, unspecified with withdrawal, unspecified: Secondary | ICD-10-CM | POA: Diagnosis present

## 2019-10-01 DIAGNOSIS — D638 Anemia in other chronic diseases classified elsewhere: Secondary | ICD-10-CM | POA: Diagnosis present

## 2019-10-01 DIAGNOSIS — Z8249 Family history of ischemic heart disease and other diseases of the circulatory system: Secondary | ICD-10-CM | POA: Diagnosis not present

## 2019-10-01 DIAGNOSIS — D649 Anemia, unspecified: Secondary | ICD-10-CM | POA: Diagnosis present

## 2019-10-01 DIAGNOSIS — I10 Essential (primary) hypertension: Secondary | ICD-10-CM | POA: Diagnosis present

## 2019-10-01 DIAGNOSIS — G9340 Encephalopathy, unspecified: Secondary | ICD-10-CM

## 2019-10-01 DIAGNOSIS — F10232 Alcohol dependence with withdrawal with perceptual disturbance: Secondary | ICD-10-CM | POA: Diagnosis not present

## 2019-10-01 DIAGNOSIS — E538 Deficiency of other specified B group vitamins: Secondary | ICD-10-CM | POA: Diagnosis present

## 2019-10-01 DIAGNOSIS — F1010 Alcohol abuse, uncomplicated: Secondary | ICD-10-CM | POA: Diagnosis not present

## 2019-10-01 DIAGNOSIS — G9341 Metabolic encephalopathy: Secondary | ICD-10-CM | POA: Diagnosis present

## 2019-10-01 DIAGNOSIS — R7989 Other specified abnormal findings of blood chemistry: Secondary | ICD-10-CM

## 2019-10-01 DIAGNOSIS — R296 Repeated falls: Secondary | ICD-10-CM

## 2019-10-01 DIAGNOSIS — F10239 Alcohol dependence with withdrawal, unspecified: Secondary | ICD-10-CM | POA: Diagnosis present

## 2019-10-01 DIAGNOSIS — F10932 Alcohol use, unspecified with withdrawal with perceptual disturbance: Secondary | ICD-10-CM

## 2019-10-01 DIAGNOSIS — D61818 Other pancytopenia: Secondary | ICD-10-CM | POA: Diagnosis present

## 2019-10-01 DIAGNOSIS — E876 Hypokalemia: Secondary | ICD-10-CM | POA: Diagnosis not present

## 2019-10-01 DIAGNOSIS — M6282 Rhabdomyolysis: Secondary | ICD-10-CM | POA: Diagnosis present

## 2019-10-01 DIAGNOSIS — E785 Hyperlipidemia, unspecified: Secondary | ICD-10-CM | POA: Diagnosis present

## 2019-10-01 DIAGNOSIS — G629 Polyneuropathy, unspecified: Secondary | ICD-10-CM | POA: Diagnosis present

## 2019-10-01 DIAGNOSIS — R000 Tachycardia, unspecified: Secondary | ICD-10-CM | POA: Diagnosis present

## 2019-10-01 DIAGNOSIS — Z8711 Personal history of peptic ulcer disease: Secondary | ICD-10-CM | POA: Diagnosis not present

## 2019-10-01 DIAGNOSIS — W19XXXA Unspecified fall, initial encounter: Secondary | ICD-10-CM

## 2019-10-01 DIAGNOSIS — N400 Enlarged prostate without lower urinary tract symptoms: Secondary | ICD-10-CM | POA: Diagnosis present

## 2019-10-01 DIAGNOSIS — K760 Fatty (change of) liver, not elsewhere classified: Secondary | ICD-10-CM | POA: Diagnosis present

## 2019-10-01 DIAGNOSIS — Z20822 Contact with and (suspected) exposure to covid-19: Secondary | ICD-10-CM | POA: Diagnosis present

## 2019-10-01 LAB — COMPREHENSIVE METABOLIC PANEL
ALT: 64 U/L — ABNORMAL HIGH (ref 0–44)
AST: 63 U/L — ABNORMAL HIGH (ref 15–41)
Albumin: 3.7 g/dL (ref 3.5–5.0)
Alkaline Phosphatase: 107 U/L (ref 38–126)
Anion gap: 17 — ABNORMAL HIGH (ref 5–15)
BUN: 104 mg/dL — ABNORMAL HIGH (ref 8–23)
CO2: 22 mmol/L (ref 22–32)
Calcium: 8.2 mg/dL — ABNORMAL LOW (ref 8.9–10.3)
Chloride: 92 mmol/L — ABNORMAL LOW (ref 98–111)
Creatinine, Ser: 2.76 mg/dL — ABNORMAL HIGH (ref 0.61–1.24)
GFR calc Af Amer: 26 mL/min — ABNORMAL LOW (ref >60)
GFR calc non Af Amer: 23 mL/min — ABNORMAL LOW (ref >60)
Glucose, Bld: 153 mg/dL — ABNORMAL HIGH (ref 70–99)
Potassium: 3.9 mmol/L (ref 3.5–5.1)
Sodium: 131 mmol/L — ABNORMAL LOW (ref 135–145)
Total Bilirubin: 1.6 mg/dL — ABNORMAL HIGH (ref 0.3–1.2)
Total Protein: 6.7 g/dL (ref 6.5–8.1)

## 2019-10-01 LAB — BASIC METABOLIC PANEL
Anion gap: 15 (ref 5–15)
BUN: 89 mg/dL — ABNORMAL HIGH (ref 8–23)
CO2: 22 mmol/L (ref 22–32)
Calcium: 7.6 mg/dL — ABNORMAL LOW (ref 8.9–10.3)
Chloride: 95 mmol/L — ABNORMAL LOW (ref 98–111)
Creatinine, Ser: 2.19 mg/dL — ABNORMAL HIGH (ref 0.61–1.24)
GFR calc Af Amer: 35 mL/min — ABNORMAL LOW (ref >60)
GFR calc non Af Amer: 30 mL/min — ABNORMAL LOW (ref >60)
Glucose, Bld: 135 mg/dL — ABNORMAL HIGH (ref 70–99)
Potassium: 3.5 mmol/L (ref 3.5–5.1)
Sodium: 132 mmol/L — ABNORMAL LOW (ref 135–145)

## 2019-10-01 LAB — CBC WITH DIFFERENTIAL/PLATELET
Abs Immature Granulocytes: 0.03 10*3/uL (ref 0.00–0.07)
Abs Immature Granulocytes: 0.02 10*3/uL (ref 0.00–0.07)
Basophils Absolute: 0 10*3/uL (ref 0.0–0.1)
Basophils Absolute: 0 10*3/uL (ref 0.0–0.1)
Basophils Relative: 0 %
Basophils Relative: 0 %
Eosinophils Absolute: 0 10*3/uL (ref 0.0–0.5)
Eosinophils Absolute: 0 10*3/uL (ref 0.0–0.5)
Eosinophils Relative: 0 %
Eosinophils Relative: 1 %
HCT: 36.1 % — ABNORMAL LOW (ref 39.0–52.0)
HCT: 25.8 % — ABNORMAL LOW (ref 39.0–52.0)
Hemoglobin: 12.3 g/dL — ABNORMAL LOW (ref 13.0–17.0)
Hemoglobin: 8.8 g/dL — ABNORMAL LOW (ref 13.0–17.0)
Immature Granulocytes: 1 %
Immature Granulocytes: 1 %
Lymphocytes Relative: 7 %
Lymphocytes Relative: 10 %
Lymphs Abs: 0.5 10*3/uL — ABNORMAL LOW (ref 0.7–4.0)
Lymphs Abs: 0.4 10*3/uL — ABNORMAL LOW (ref 0.7–4.0)
MCH: 33.2 pg (ref 26.0–34.0)
MCH: 33.5 pg (ref 26.0–34.0)
MCHC: 34.1 g/dL (ref 30.0–36.0)
MCHC: 34.1 g/dL (ref 30.0–36.0)
MCV: 97.6 fL (ref 80.0–100.0)
MCV: 98.1 fL (ref 80.0–100.0)
Monocytes Absolute: 1 10*3/uL (ref 0.1–1.0)
Monocytes Absolute: 0.6 10*3/uL (ref 0.1–1.0)
Monocytes Relative: 15 %
Monocytes Relative: 16 %
Neutro Abs: 5.1 10*3/uL (ref 1.7–7.7)
Neutro Abs: 2.8 10*3/uL (ref 1.7–7.7)
Neutrophils Relative %: 77 %
Neutrophils Relative %: 72 %
Platelets: 85 10*3/uL — ABNORMAL LOW (ref 150–400)
Platelets: 65 10*3/uL — ABNORMAL LOW (ref 150–400)
RBC: 3.7 MIL/uL — ABNORMAL LOW (ref 4.22–5.81)
RBC: 2.63 MIL/uL — ABNORMAL LOW (ref 4.22–5.81)
RDW: 12.8 % (ref 11.5–15.5)
RDW: 13.1 % (ref 11.5–15.5)
WBC: 6.6 10*3/uL (ref 4.0–10.5)
WBC: 3.8 10*3/uL — ABNORMAL LOW (ref 4.0–10.5)
nRBC: 0 % (ref 0.0–0.2)
nRBC: 0 % (ref 0.0–0.2)

## 2019-10-01 LAB — BLOOD GAS, ARTERIAL
Acid-base deficit: 0.3 mmol/L (ref 0.0–2.0)
Bicarbonate: 23.4 mmol/L (ref 20.0–28.0)
Drawn by: 25770
FIO2: 21
O2 Saturation: 96.2 %
Patient temperature: 97.9
pCO2 arterial: 36.4 mmHg (ref 32.0–48.0)
pH, Arterial: 7.423 (ref 7.350–7.450)
pO2, Arterial: 80.4 mmHg — ABNORMAL LOW (ref 83.0–108.0)

## 2019-10-01 LAB — HEPATIC FUNCTION PANEL
ALT: 56 U/L — ABNORMAL HIGH (ref 0–44)
AST: 52 U/L — ABNORMAL HIGH (ref 15–41)
Albumin: 3.2 g/dL — ABNORMAL LOW (ref 3.5–5.0)
Alkaline Phosphatase: 91 U/L (ref 38–126)
Bilirubin, Direct: 0.3 mg/dL — ABNORMAL HIGH (ref 0.0–0.2)
Indirect Bilirubin: 1 mg/dL — ABNORMAL HIGH (ref 0.3–0.9)
Total Bilirubin: 1.3 mg/dL — ABNORMAL HIGH (ref 0.3–1.2)
Total Protein: 5.8 g/dL — ABNORMAL LOW (ref 6.5–8.1)

## 2019-10-01 LAB — IRON AND TIBC
Iron: 39 ug/dL — ABNORMAL LOW (ref 45–182)
Saturation Ratios: 26 % (ref 17.9–39.5)
TIBC: 153 ug/dL — ABNORMAL LOW (ref 250–450)
UIBC: 114 ug/dL

## 2019-10-01 LAB — FOLATE: Folate: 10.4 ng/mL (ref >5.9)

## 2019-10-01 LAB — URINALYSIS, ROUTINE W REFLEX MICROSCOPIC
Bacteria, UA: NONE SEEN
Bilirubin Urine: NEGATIVE
Glucose, UA: 50 mg/dL — AB
Ketones, ur: 5 mg/dL — AB
Leukocytes,Ua: NEGATIVE
Nitrite: NEGATIVE
Protein, ur: NEGATIVE mg/dL
Specific Gravity, Urine: 1.014 (ref 1.005–1.030)
pH: 5 (ref 5.0–8.0)

## 2019-10-01 LAB — FERRITIN: Ferritin: 950 ng/mL — ABNORMAL HIGH (ref 24–336)

## 2019-10-01 LAB — RAPID URINE DRUG SCREEN, HOSP PERFORMED
Amphetamines: NOT DETECTED
Barbiturates: NOT DETECTED
Benzodiazepines: POSITIVE — AB
Cocaine: NOT DETECTED
Opiates: NOT DETECTED
Tetrahydrocannabinol: NOT DETECTED

## 2019-10-01 LAB — VITAMIN B12: Vitamin B-12: 265 pg/mL (ref 180–914)

## 2019-10-01 LAB — TSH: TSH: 1.944 u[IU]/mL (ref 0.350–4.500)

## 2019-10-01 LAB — SARS CORONAVIRUS 2 BY RT PCR (HOSPITAL ORDER, PERFORMED IN ~~LOC~~ HOSPITAL LAB): SARS Coronavirus 2: NEGATIVE

## 2019-10-01 LAB — RETICULOCYTES
Immature Retic Fract: 2.7 % (ref 2.3–15.9)
RBC.: 2.6 MIL/uL — ABNORMAL LOW (ref 4.22–5.81)
Retic Count, Absolute: 13.5 10*3/uL — ABNORMAL LOW (ref 19.0–186.0)
Retic Ct Pct: 0.5 % (ref 0.4–3.1)

## 2019-10-01 LAB — MAGNESIUM: Magnesium: 2.7 mg/dL — ABNORMAL HIGH (ref 1.7–2.4)

## 2019-10-01 LAB — CBG MONITORING, ED
Glucose-Capillary: 160 mg/dL — ABNORMAL HIGH (ref 70–99)
Glucose-Capillary: 155 mg/dL — ABNORMAL HIGH (ref 70–99)
Glucose-Capillary: 118 mg/dL — ABNORMAL HIGH (ref 70–99)
Glucose-Capillary: 107 mg/dL — ABNORMAL HIGH (ref 70–99)

## 2019-10-01 LAB — CK: Total CK: 653 U/L — ABNORMAL HIGH (ref 49–397)

## 2019-10-01 LAB — ETHANOL: Alcohol, Ethyl (B): <10 mg/dL (ref <10)

## 2019-10-01 LAB — PATHOLOGIST SMEAR REVIEW

## 2019-10-01 LAB — CREATININE, URINE, RANDOM: Creatinine, Urine: 100.14 mg/dL

## 2019-10-01 LAB — SODIUM, URINE, RANDOM: Sodium, Ur: 32 mmol/L

## 2019-10-01 LAB — LACTATE DEHYDROGENASE: LDH: 203 U/L — ABNORMAL HIGH (ref 98–192)

## 2019-10-01 LAB — AMMONIA: Ammonia: 29 umol/L (ref 9–35)

## 2019-10-01 MED ORDER — LORAZEPAM 2 MG/ML IJ SOLN: 0.0000 mg | Freq: Two times a day (BID) | INTRAMUSCULAR | Status: DC

## 2019-10-01 MED ORDER — ONDANSETRON HCL 4 MG PO TABS: 4.0000 mg | ORAL_TABLET | Freq: Four times a day (QID) | ORAL | Status: DC | PRN

## 2019-10-01 MED ORDER — HYDRALAZINE HCL 20 MG/ML IJ SOLN: 10.0000 mg | INTRAMUSCULAR | Status: DC | PRN

## 2019-10-01 MED ORDER — LORAZEPAM 1 MG PO TABS: 0.0000 mg | ORAL_TABLET | Freq: Four times a day (QID) | ORAL | Status: DC

## 2019-10-01 MED ORDER — THIAMINE HCL 100 MG PO TABS
100.0000 mg | ORAL_TABLET | Freq: Every day | ORAL | Status: DC
  Filled 2019-10-01: qty 1

## 2019-10-01 MED ORDER — CYANOCOBALAMIN 1000 MCG/ML IJ SOLN
1000.0000 ug | Freq: Once | INTRAMUSCULAR | Status: AC
  Administered 2019-10-01: 1000 ug via SUBCUTANEOUS
  Filled 2019-10-01: qty 1

## 2019-10-01 MED ORDER — VITAMIN B-12 1000 MCG PO TABS
1000.0000 ug | ORAL_TABLET | Freq: Every day | ORAL | Status: DC
  Administered 2019-10-02 – 2019-10-06 (×5): 1000 ug via ORAL
  Filled 2019-10-01 (×5): qty 1

## 2019-10-01 MED ORDER — SODIUM CHLORIDE 0.9 % IV SOLN: INTRAVENOUS | Status: DC

## 2019-10-01 MED ORDER — THIAMINE HCL 100 MG/ML IJ SOLN
100.0000 mg | Freq: Every day | INTRAMUSCULAR | Status: DC
  Administered 2019-10-01 – 2019-10-02 (×2): 100 mg via INTRAVENOUS
  Filled 2019-10-01 (×2): qty 2

## 2019-10-01 MED ORDER — PANTOPRAZOLE SODIUM 40 MG IV SOLR
40.0000 mg | Freq: Two times a day (BID) | INTRAVENOUS | Status: DC
  Administered 2019-10-01 – 2019-10-02 (×3): 40 mg via INTRAVENOUS
  Filled 2019-10-01 (×3): qty 40

## 2019-10-01 MED ORDER — ENOXAPARIN SODIUM 40 MG/0.4ML ~~LOC~~ SOLN: 40.0000 mg | SUBCUTANEOUS | Status: DC

## 2019-10-01 MED ORDER — LORAZEPAM 1 MG PO TABS: 0.0000 mg | ORAL_TABLET | Freq: Two times a day (BID) | ORAL | Status: DC

## 2019-10-01 MED ORDER — SODIUM CHLORIDE 0.9 % IV BOLUS (SEPSIS)
1000.0000 mL | Freq: Once | INTRAVENOUS | Status: AC
  Administered 2019-10-01: 1000 mL via INTRAVENOUS

## 2019-10-01 MED ORDER — ONDANSETRON HCL 4 MG/2ML IJ SOLN
4.0000 mg | Freq: Four times a day (QID) | INTRAMUSCULAR | Status: DC | PRN
  Administered 2019-10-02 – 2019-10-03 (×2): 4 mg via INTRAVENOUS
  Filled 2019-10-01 (×2): qty 2

## 2019-10-01 MED ORDER — SODIUM CHLORIDE 0.9 % IV SOLN: INTRAVENOUS | Status: AC

## 2019-10-01 MED ORDER — LORAZEPAM 2 MG/ML IJ SOLN
0.0000 mg | Freq: Four times a day (QID) | INTRAMUSCULAR | Status: DC
  Administered 2019-10-01: 2 mg via INTRAVENOUS
  Filled 2019-10-01: qty 1

## 2019-10-01 MED ORDER — THIAMINE HCL 100 MG/ML IJ SOLN
100.0000 mg | Freq: Every day | INTRAMUSCULAR | Status: DC
  Administered 2019-10-01: 100 mg via INTRAVENOUS
  Filled 2019-10-01: qty 2

## 2019-10-01 NOTE — Progress Notes (Signed)
PROGRESS NOTE  Ruben [name] XNT:[id number] DOB: 1950-09-14 DOA: 10/01/2019 PCP: [name], MD  HPI/Recap of past 24 hours: HPI from Dr [name] is a 69 y.o. male with history of alcoholism who has not had any alcohol for last 2 days since patient was trying to detox himself presents to the ER because he has been having falls frequently last 2 days.  Has not complained of any chest pain or shortness of breath nausea vomiting or diarrhea.  Most of the history was obtained from the ER physician since patient is confused at the time of my exam. In the ER patient was found to be diaphoretic and tremulous.  Patient was placed on CIWA protocol, CT head was unremarkable, EKG shows sinus tachycardia.  Labs show creatinine which has increased from 1.17 in May 24 to around 2.7.  BUN is 104.  CBC shows hemoglobin of 12.3 platelets 85.  At the time of my exam patient appears completely confused.  Per ER physician patient was alert and awake when ER physician saw pt. Confusion worsened probably likely 2/2 Ativan.    Today, pt very sleepy, lethargic, somewhat difficult to arouse but responsive to pain unable to follow simple commands.  Unable to perform further ROS.    Assessment/Plan: Principal Problem:   ARF (acute renal failure) (HCC) Active Problems:   Essential hypertension   Alcohol withdrawal (HCC)   Normochromic normocytic anemia  Acute metabolic encephalopathy Likely 2/2 alcohol withdrawal/medication induced Vs ?hepatic encephalopathy, r/o sepsis Currently afebrile, with no leukocytosis Ammonia level 29, mildly elevated LFTs with T bili 1.3 BC x2 pending UA/UC pending Chest x-ray pending Right upper quadrant ultrasound pending CT head unremarkable Hold off CIWA protocol for now due to encephalopathy N.p.o., continue IV fluids Monitor very closely in SDU  Mild rhabdomyolysis CK level 653, will trend Continue IV fluids  AKI Likely 2/2 poor oral intake, in  addition to rhabdo Continue IV fluids Daily CMP  Hyponatremia Likely 2/2 poor oral intake/alcohol abuse Daily CMP  Pancytopenia/anemia of chronic disease/vitamin B12 DEF Likely 2/2 bone marrow suppression 2/2 alcohol abuse Anemia panel showed iron 39, TIBC 153, sats 26, ferritin 950, folate 10.4, vitamin B12 265 Huge drop in hemoglobin to 8.8, worsened by hemodilution with aggressive IV fluids, no signs of bleeding, FOBT pending Start vitamin B12 supplements Start Protonix IV every 12 hours Daily CBC  Alcohol abuse Continue IV fluids, thiamine, B12 supplementation Advised to quit Hold off CIWA protocol for now due to encephalopathy  Hypertension BP stable Monitor  Frequent falls Likely 2/2 alcohol abuse/neuropathy CT head unremarkable X-ray pelvis unremarkable PT/OT Fall precautions       Malnutrition Type:      Malnutrition Characteristics:      Nutrition Interventions:       Estimated body mass index is 24.13 kg/m as calculated from the following:   Height as of 09/01/19: 5\' 11"  (1.803 m).   Weight as of 09/01/19: 78.5 kg.     Code Status: Full  Family Communication: Plan to discuss with family, none at bedside  Disposition Plan: Status is: Inpatient  Remains inpatient appropriate because:Inpatient level of care appropriate due to severity of illness   Dispo: The patient is from: Home              Anticipated d/c is to: Home              Anticipated d/c date is: 2 days  Patient currently is not medically stable to d/c.    Consultants:  None  Procedures:  None  Antimicrobials:  None  DVT prophylaxis: SCDs   Objective: Vitals:   10/01/19 1017 10/01/19 1032 10/01/19 1128 10/01/19 1340  BP: 124/78 111/80 118/86 127/81  Pulse: 69 62 81 71  Resp: 11 11 11 13   Temp:      TempSrc:      SpO2: 100% 99% 99% 98%    Intake/Output Summary (Last 24 hours) at 10/01/2019 1526 Last data filed at 10/01/2019 0641 Gross per 24  hour  Intake 2068.75 ml  Output --  Net 2068.75 ml   There were no vitals filed for this visit.  Exam:  General: NAD, sleepy, lethargic  Cardiovascular: S1, S2 present  Respiratory: CTAB  Abdomen: Soft, nontender, nondistended, bowel sounds present  Musculoskeletal: No bilateral pedal edema noted  Skin: Normal  Psychiatry:  Unable to assess   Data Reviewed: CBC: Recent Labs  Lab 10/01/19 0334 10/01/19 0758  WBC 6.6 3.8*  NEUTROABS 5.1 2.8  HGB 12.3* 8.8*  HCT 36.1* 25.8*  MCV 97.6 98.1  PLT 85* 65*   Basic Metabolic Panel: Recent Labs  Lab 10/01/19 0334 10/01/19 0758  NA 131* 132*  K 3.9 3.5  CL 92* 95*  CO2 22 22  GLUCOSE 153* 135*  BUN 104* 89*  CREATININE 2.76* 2.19*  CALCIUM 8.2* 7.6*  MG 2.7*  --    GFR: CrCl cannot be calculated (Unknown ideal weight.). Liver Function Tests: Recent Labs  Lab 10/01/19 0334 10/01/19 0758  AST 63* 52*  ALT 64* 56*  ALKPHOS 107 91  BILITOT 1.6* 1.3*  PROT 6.7 5.8*  ALBUMIN 3.7 3.2*   No results for input(s): LIPASE, AMYLASE in the last 168 hours. Recent Labs  Lab 10/01/19 0823  AMMONIA 29   Coagulation Profile: No results for input(s): INR, PROTIME in the last 168 hours. Cardiac Enzymes: Recent Labs  Lab 10/01/19 0758  CKTOTAL 653*   BNP (last 3 results) No results for input(s): PROBNP in the last 8760 hours. HbA1C: No results for input(s): HGBA1C in the last 72 hours. CBG: Recent Labs  Lab 10/01/19 0428 10/01/19 0940 10/01/19 1136  GLUCAP 160* 155* 118*   Lipid Profile: No results for input(s): CHOL, HDL, LDLCALC, TRIG, CHOLHDL, LDLDIRECT in the last 72 hours. Thyroid Function Tests: Recent Labs    10/01/19 0758  TSH 1.944   Anemia Panel: Recent Labs    10/01/19 0758  VITAMINB12 265  FOLATE 10.4  FERRITIN 950*  TIBC 153*  IRON 39*  RETICCTPCT 0.5   Urine analysis:    Component Value Date/Time   COLORURINE YELLOW 09/01/2019 1730   APPEARANCEUR CLEAR 09/01/2019 1730    LABSPEC 1.015 09/01/2019 1730   PHURINE 5.0 09/01/2019 1730   GLUCOSEU NEGATIVE 09/01/2019 1730   HGBUR NEGATIVE 09/01/2019 1730   [place] 09/01/2019 1730   KETONESUR 5 (A) 09/01/2019 1730   PROTEINUR NEGATIVE 09/01/2019 1730   NITRITE NEGATIVE 09/01/2019 1730   LEUKOCYTESUR NEGATIVE 09/01/2019 1730   Sepsis Labs: @LABRCNTIP (procalcitonin:4,lacticidven:4)  ) Recent Results (from the past 240 hour(s))  SARS Coronavirus 2 by RT PCR (hospital order, performed in [facility] hospital lab) Nasopharyngeal Nasopharyngeal Swab     Status: None   Collection Time: 10/01/19  4:59 AM   Specimen: Nasopharyngeal Swab  Result Value Ref Range Status   SARS Coronavirus 2 NEGATIVE NEGATIVE Final    Comment: (NOTE) SARS-CoV-2 target nucleic acids are NOT DETECTED.  The  SARS-CoV-2 RNA is generally detectable in upper and lower respiratory specimens during the acute phase of infection. The lowest concentration of SARS-CoV-2 viral copies this assay can detect is 250 copies / mL. A negative result does not preclude SARS-CoV-2 infection and should not be used as the sole basis for treatment or other patient management decisions.  A negative result may occur with improper specimen collection / handling, submission of specimen other than nasopharyngeal swab, presence of viral mutation(s) within the areas targeted by this assay, and inadequate number of viral copies (<250 copies / mL). A negative result must be combined with clinical observations, patient history, and epidemiological information.  Fact Sheet for Patients:   StrictlyIdeas.no  Fact Sheet for Healthcare Providers: BankingDealers.co.za  This test is not yet approved or  cleared by the [place] FDA and has been authorized for detection and/or diagnosis of SARS-CoV-2 by FDA under an Emergency Use Authorization (EUA).  This EUA will remain in effect (meaning this test can be  used) for the duration of the COVID-19 declaration under Section 564(b)(1) of the Act, 21 U.S.C. section 360bbb-3(b)(1), unless the authorization is terminated or revoked sooner.  Performed at [facility], [place] [address], [place], [place] [address]       Studies: DG Pelvis 1-2 Views  Result Date: 10/01/2019 CLINICAL DATA:  Multiple recent falls.  Alcohol withdrawal. EXAM: PELVIS - 1-2 VIEW COMPARISON:  None. FINDINGS: No acute or healing fractures are present. Mild degenerative changes are present in the hips, right greater than left. IMPRESSION: Mild degenerative changes of the hips, right greater than left. Electronically Signed   By: San Morelle M.D.   On: 10/01/2019 07:12   CT Head Wo Contrast  Result Date: 10/01/2019 CLINICAL DATA:  Multiple falls. Patient feels off balance. Fall yesterday with trauma to head. EXAM: CT HEAD WITHOUT CONTRAST TECHNIQUE: Contiguous axial images were obtained from the base of the skull through the vertex without intravenous contrast. COMPARISON:  CT head without contrast 07/30/2019 at [facility] and 06/21/2019 at [facility]: Brain: Mild atrophy and white matter changes are likely within normal limits for age. There is no significant interval change. No acute infarct, hemorrhage, or mass lesion is present. The ventricles are of normal size. No significant extraaxial fluid collection is present. Vascular: Atherosclerotic calcifications are present within the cavernous internal carotid arteries and at the dural margin of the left vertebral artery. No hyperdense vessel is present. Skull: Calvarium is intact. No focal lytic or blastic lesions are present. No significant extracranial soft tissue lesion is present. Sinuses/Orbits: Minimal mucosal thickening is present within the anterior ethmoid air cells. Remaining paranasal sinuses and mastoid air cells are clear. The globes and orbits  are within normal limits. IMPRESSION: 1. No acute intracranial abnormality or significant interval change. 2. Stable atrophy and white matter disease, likely within normal limits for age. 3. Atherosclerosis. Electronically Signed   By: San Morelle M.D.   On: 10/01/2019 05:25    Scheduled Meds: . pantoprazole (PROTONIX) IV  40 mg Intravenous Q12H  . thiamine injection  100 mg Intravenous Daily    Continuous Infusions: . sodium chloride 150 mL/hr at 10/01/19 0641     LOS: 0 days     Alma Friendly, MD [facility]  If 7PM-7AM, please contact night-coverage [email or web address] 10/01/2019, 3:26 PM

## 2019-10-01 NOTE — ED Provider Notes (Signed)
TIME SEEN: 3:36 AM  CHIEF COMPLAINT: Falls, feeling off balance  HPI: Patient is a 69 year old male with history of alcohol abuse, hypertension, hyperlipidemia who presents to the emergency department with frequent falls and feeling off balance for the past 3 days.  Reports his last drink of alcohol was 4 days ago.  Denies fevers, pain, nausea, vomiting, diarrhea, numbness or focal weakness.  He has hit his head during these falls.  On blood thinners.  States he still feels like he is drunk.  He is having tremors.  No alcohol withdrawal seizures.  Has been seeing people in his house that are not there.  He has never had history of hallucinations with alcohol withdrawal before.  States he has been in rehab a couple of times before.  About 2 years ago he was sober for several months before he started drinking again.  States he drinks 750 mL of liquor a day.  Denies drug use.  Denies SI or HI.  ROS: See HPI Constitutional: no fever  Eyes: no drainage  ENT: no runny nose   Cardiovascular:  no chest pain  Resp: no SOB  GI: no vomiting GU: no dysuria Integumentary: no rash  Allergy: no hives  Musculoskeletal: no leg swelling  Neurological: no slurred speech ROS otherwise negative  PAST MEDICAL HISTORY/PAST SURGICAL HISTORY:  Past Medical History:  Diagnosis Date  . Alcoholism ([place])   . Aortic atherosclerosis ([place]) 12/26/2016   Noted on CT  . Arthritis   . BPH (benign prostatic hyperplasia)   . Chronic constipation    takes magnesium  . Duodenal ulcer   . Dyspnea 12/17/2017  . ED (erectile dysfunction)   . Foley catheter in place    history of no longer in place  . GERD (gastroesophageal reflux disease)   . Heart palpitations    Occ  . Hiatal hernia   . History of adenomatous polyp of colon    tubular adenoma's 2005  . History of Helicobacter pylori infection    2008  . Hypercholesteremia   . Hypertension   . MR (mitral regurgitation)    Mild, noted on ECHO  . Pre-diabetes    . Pulmonary nodule 12/26/2016   noted on CT, 5 x 3 mm nodular opacity right upper lobe  . Rosacea   . Schatzki's ring    last dilated 02/ 2016  . Urinary retention    history of  . Varicocele 02/25/2014   Small to moderate left varicocele    MEDICATIONS:  Prior to Admission medications   Medication Sig Start Date End Date Taking? Authorizing Provider  atorvastatin (LIPITOR) 20 MG tablet Take 20 mg by mouth daily.   Yes [provider]  lisinopril-hydrochlorothiazide (PRINZIDE,ZESTORETIC) 20-12.5 MG per tablet Take 2 tablets by mouth daily.    Yes [provider]  chlordiazePOXIDE (LIBRIUM) 25 MG capsule 50mg  PO TID x 1D, then 25-50mg  PO BID X 1D, then 25-50mg  PO QD X 1D Patient not taking: Reported on 10/01/2019 09/01/19   Gareth Morgan, MD  docusate sodium (COLACE) 100 MG capsule Take 1 capsule (100 mg total) by mouth 2 (two) times daily. Ok to stop taking if having loose bowel movements Patient not taking: Reported on 09/01/2019 04/28/18   Clovis Riley, MD  folic acid (FOLVITE) 1 MG tablet Take 1 tablet (1 mg total) by mouth daily. 06/28/19   Mariel Aloe, MD  metoprolol tartrate (LOPRESSOR) 25 MG tablet Take 1 tablet (25 mg total) by mouth 2 (two) times  daily. Patient not taking: Reported on 09/01/2019 06/27/19   Mariel Aloe, MD  Multiple Vitamin (MULTIVITAMIN WITH MINERALS) TABS tablet Take 1 tablet by mouth daily. 06/28/19   Mariel Aloe, MD  thiamine 100 MG tablet Take 1 tablet (100 mg total) by mouth daily. Patient not taking: Reported on 09/01/2019 06/28/19   Mariel Aloe, MD    ALLERGIES:  No Known Allergies  SOCIAL HISTORY:  Social History   Tobacco Use  . Smoking status: Never Smoker  . Smokeless tobacco: Never Used  Substance Use Topics  . Alcohol use: Yes    FAMILY HISTORY: Family History  Problem Relation Age of Onset  . Heart failure Mother   . Stroke Father     EXAM: BP 102/73   Pulse (!) 119   Temp 97.9 F (36.6 C)    Resp 17   SpO2 100%  CONSTITUTIONAL: Alert and oriented and responds appropriately to questions. Well-appearing; well-nourished; GCS 15 HEAD: Normocephalic; atraumatic EYES: Conjunctivae clear, PERRL, EOMI ENT: normal nose; no rhinorrhea; moist mucous membranes; pharynx without lesions noted; no dental injury; no septal hematoma NECK: Supple, no meningismus, no LAD; no midline spinal tenderness, step-off or deformity; trachea midline CARD: Regular tachycardic; S1 and S2 appreciated; no murmurs, no clicks, no rubs, no gallops RESP: Normal chest excursion without splinting or tachypnea; breath sounds clear and equal bilaterally; no wheezes, no rhonchi, no rales; no hypoxia or respiratory distress CHEST:  chest wall stable, no crepitus or ecchymosis or deformity, nontender to palpation; no flail chest ABD/GI: Normal bowel sounds; non-distended; soft, non-tender, no rebound, no guarding; no ecchymosis or other lesions noted PELVIS:  stable, nontender to palpation BACK:  The back appears normal and is non-tender to palpation, there is no CVA tenderness; no midline spinal tenderness, step-off or deformity EXT: Normal ROM in all joints; non-tender to palpation; no edema; normal capillary refill; no cyanosis, no bony tenderness or bony deformity of patient's extremities, no joint effusion, compartments are soft, extremities are warm and well-perfused, no ecchymosis SKIN: Normal color for age and race; warm NEURO: Moves all extremities equally, tremulous, cranial nerves II through XII intact, normal speech, no nystagmus noted, mild dysmetria versus tremor noted with the left upper extremity but no dysmetria with the right upper extremity, no drift PSYCH: The patient's mood and manner are appropriate. Grooming and personal hygiene are appropriate.  Denies SI or HI.  MEDICAL DECISION MAKING: Patient here with feeling off balance and frequent falls.  Suspect this is related to alcohol withdrawal but could also  be a stroke.  Tachycardic here and tremulous.  Will give IV fluids, Ativan, thiamine.  Will obtain labs, urine, head CT.  No sign of traumatic injury on exam.  ED PROGRESS: Labs reviewed/interpreted and show AKI.  Creatinine today is 2.76 and was normal a couple of months ago.  Electrolytes within normal limits.  Will discuss with hospitalist.  5:06 AM Discussed patient's case with hospitalist, Dr. Hal Hope.  I have recommended admission and patient (and family if present) agree with this plan. Admitting physician will place admission orders.   I reviewed all nursing notes, vitals, pertinent previous records and reviewed/interpreted all EKGs, lab and urine results, imaging (as available).   CT head reviewed/interpreted and shows no acute abnormality.   EKG Interpretation  Date/Time:  Wednesday October 01 2019 04:42:11 EDT Ventricular Rate:  107 PR Interval:    QRS Duration: 97 QT Interval:  351 QTC Calculation: 469 R Axis:   30 Text Interpretation:  Sinus tachycardia Probable left atrial enlargement No significant change since last tracing Confirmed by Pryor Curia [phone]) on 10/01/2019 4:59:24 AM       [name] was evaluated in Emergency Department on 10/01/2019 for the symptoms described in the history of present illness. He was evaluated in the context of the global COVID-19 pandemic, which necessitated consideration that the patient might be at risk for infection with the SARS-CoV-2 virus that causes COVID-19. Institutional protocols and algorithms that pertain to the evaluation of patients at risk for COVID-19 are in a state of rapid change based on information released by regulatory bodies including the CDC and federal and state organizations. These policies and algorithms were followed during the patient's care in the ED.       Sims Laday, Delice Bison, DO 10/01/19 [phone]

## 2019-10-01 NOTE — Plan of Care (Signed)
Problem: Education: Goal: Knowledge of General Education information will improve Description: Including pain rating scale, medication(s)/side effects and non-pharmacologic comfort measures Outcome: Progressing   Problem: Health Behavior/Discharge Planning: Goal: Ability to manage health-related needs will improve Outcome: Progressing   Problem: Clinical Measurements: Goal: Ability to maintain clinical measurements within normal limits will improve Outcome: Progressing Goal: Will remain free from infection Outcome: Progressing Goal: Diagnostic test results will improve Outcome: Progressing Goal: Respiratory complications will improve Outcome: Progressing Goal: Cardiovascular complication will be avoided Outcome: Progressing   Problem: Activity: Goal: Risk for activity intolerance will decrease Outcome: Progressing   Problem: Nutrition: Goal: Adequate nutrition will be maintained Outcome: Progressing   Problem: Coping: Goal: Level of anxiety will decrease Outcome: Progressing   Problem: Elimination: Goal: Will not experience complications related to bowel motility Outcome: Progressing Goal: Will not experience complications related to urinary retention Outcome: Progressing   Problem: Pain Managment: Goal: General experience of comfort will improve Outcome: Progressing   Problem: Safety: Goal: Ability to remain free from injury will improve Outcome: Progressing   Problem: Skin Integrity: Goal: Risk for impaired skin integrity will decrease Outcome: Progressing   Problem: Education: Goal: Knowledge of disease or condition will improve Outcome: Progressing Goal: Understanding of discharge needs will improve Outcome: Progressing   Problem: Health Behavior/Discharge Planning: Goal: Ability to identify changes in lifestyle to reduce recurrence of condition will improve Outcome: Progressing Goal: Identification of resources available to assist in meeting health  care needs will improve Outcome: Progressing   Problem: Physical Regulation: Goal: Complications related to the disease process, condition or treatment will be avoided or minimized Outcome: Progressing   Problem: Safety: Goal: Ability to remain free from injury will improve Outcome: Progressing   Problem: Education: Goal: Knowledge of General Education information will improve Description: Including pain rating scale, medication(s)/side effects and non-pharmacologic comfort measures 10/01/2019 2338 by Micheline Chapman, RN Outcome: Progressing 10/01/2019 2338 by Micheline Chapman, RN Outcome: Progressing 10/01/2019 2338 by Micheline Chapman, RN Outcome: Progressing   Problem: Health Behavior/Discharge Planning: Goal: Ability to manage health-related needs will improve 10/01/2019 2338 by Micheline Chapman, RN Outcome: Progressing 10/01/2019 2338 by Micheline Chapman, RN Outcome: Progressing 10/01/2019 2338 by Micheline Chapman, RN Outcome: Progressing   Problem: Clinical Measurements: Goal: Ability to maintain clinical measurements within normal limits will improve 10/01/2019 2338 by Micheline Chapman, RN Outcome: Progressing 10/01/2019 2338 by Micheline Chapman, RN Outcome: Progressing 10/01/2019 2338 by Micheline Chapman, RN Outcome: Progressing Goal: Will remain free from infection 10/01/2019 2338 by Micheline Chapman, RN Outcome: Progressing 10/01/2019 2338 by Micheline Chapman, RN Outcome: Progressing 10/01/2019 2338 by Micheline Chapman, RN Outcome: Progressing Goal: Diagnostic test results will improve 10/01/2019 2338 by Micheline Chapman, RN Outcome: Progressing 10/01/2019 2338 by Micheline Chapman, RN Outcome: Progressing 10/01/2019 2338 by Micheline Chapman, RN Outcome: Progressing Goal: Respiratory complications will improve 10/01/2019 2338 by Micheline Chapman, RN Outcome: Progressing 10/01/2019 2338 by Micheline Chapman, RN Outcome: Progressing 10/01/2019 2338 by Micheline Chapman, RN Outcome:  Progressing Goal: Cardiovascular complication will be avoided 10/01/2019 2338 by Micheline Chapman, RN Outcome: Progressing 10/01/2019 2338 by Micheline Chapman, RN Outcome: Progressing 10/01/2019 2338 by Micheline Chapman, RN Outcome: Progressing   Problem: Activity: Goal: Risk for activity intolerance will decrease 10/01/2019 2338 by Micheline Chapman, RN Outcome: Progressing 10/01/2019 2338 by Micheline Chapman, RN Outcome: Progressing 10/01/2019 2338 by Micheline Chapman, RN Outcome: Progressing  Problem: Nutrition: Goal: Adequate nutrition will be maintained 10/01/2019 2338 by Micheline Chapman, RN Outcome: Progressing 10/01/2019 2338 by Micheline Chapman, RN Outcome: Progressing 10/01/2019 2338 by Micheline Chapman, RN Outcome: Progressing   Problem: Coping: Goal: Level of anxiety will decrease 10/01/2019 2338 by Micheline Chapman, RN Outcome: Progressing 10/01/2019 2338 by Micheline Chapman, RN Outcome: Progressing 10/01/2019 2338 by Micheline Chapman, RN Outcome: Progressing   Problem: Elimination: Goal: Will not experience complications related to bowel motility 10/01/2019 2338 by Micheline Chapman, RN Outcome: Progressing 10/01/2019 2338 by Micheline Chapman, RN Outcome: Progressing 10/01/2019 2338 by Micheline Chapman, RN Outcome: Progressing Goal: Will not experience complications related to urinary retention 10/01/2019 2338 by Micheline Chapman, RN Outcome: Progressing 10/01/2019 2338 by Micheline Chapman, RN Outcome: Progressing 10/01/2019 2338 by Micheline Chapman, RN Outcome: Progressing   Problem: Pain Managment: Goal: General experience of comfort will improve 10/01/2019 2338 by Micheline Chapman, RN Outcome: Progressing 10/01/2019 2338 by Micheline Chapman, RN Outcome: Progressing 10/01/2019 2338 by Micheline Chapman, RN Outcome: Progressing   Problem: Safety: Goal: Ability to remain free from injury will improve 10/01/2019 2338 by Micheline Chapman, RN Outcome: Progressing 10/01/2019 2338 by  Micheline Chapman, RN Outcome: Progressing 10/01/2019 2338 by Micheline Chapman, RN Outcome: Progressing   Problem: Skin Integrity: Goal: Risk for impaired skin integrity will decrease 10/01/2019 2338 by Micheline Chapman, RN Outcome: Progressing 10/01/2019 2338 by Micheline Chapman, RN Outcome: Progressing 10/01/2019 2338 by Micheline Chapman, RN Outcome: Progressing   Problem: Education: Goal: Knowledge of disease or condition will improve 10/01/2019 2338 by Micheline Chapman, RN Outcome: Progressing 10/01/2019 2338 by Micheline Chapman, RN Outcome: Progressing 10/01/2019 2338 by Micheline Chapman, RN Outcome: Progressing Goal: Understanding of discharge needs will improve 10/01/2019 2338 by Micheline Chapman, RN Outcome: Progressing 10/01/2019 2338 by Micheline Chapman, RN Outcome: Progressing 10/01/2019 2338 by Micheline Chapman, RN Outcome: Progressing   Problem: Health Behavior/Discharge Planning: Goal: Ability to identify changes in lifestyle to reduce recurrence of condition will improve 10/01/2019 2338 by Micheline Chapman, RN Outcome: Progressing 10/01/2019 2338 by Micheline Chapman, RN Outcome: Progressing 10/01/2019 2338 by Micheline Chapman, RN Outcome: Progressing Goal: Identification of resources available to assist in meeting health care needs will improve 10/01/2019 2338 by Micheline Chapman, RN Outcome: Progressing 10/01/2019 2338 by Micheline Chapman, RN Outcome: Progressing 10/01/2019 2338 by Micheline Chapman, RN Outcome: Progressing   Problem: Physical Regulation: Goal: Complications related to the disease process, condition or treatment will be avoided or minimized 10/01/2019 2338 by Micheline Chapman, RN Outcome: Progressing 10/01/2019 2338 by Micheline Chapman, RN Outcome: Progressing 10/01/2019 2338 by Micheline Chapman, RN Outcome: Progressing   Problem: Safety: Goal: Ability to remain free from injury will improve 10/01/2019 2338 by Micheline Chapman, RN Outcome: Progressing 10/01/2019 2338  by Micheline Chapman, RN Outcome: Progressing 10/01/2019 2338 by Micheline Chapman, RN Outcome: Progressing

## 2019-10-01 NOTE — H&P (Signed)
History and Physical    Ruben [name] GLO:[id number] DOB: November 11, 1950 DOA: 10/01/2019  PCP: [name], MD  Patient coming from: Home.  Chief Complaint: Frequent falls.  HPI: Ruben [name] is a 69 y.o. male with history of alcoholism who has not had any alcohol for last 2 days since patient was trying to detox himself presents to the ER because he has been having falls frequently last 2 days.  Has not complained of any chest pain or shortness of breath nausea vomiting or diarrhea.  Most of the history was obtained from the ER physician since patient is confused at the time of my exam.  ED Course: In the ER patient was found to be diaphoretic and tremulous.  Patient was placed on CIWA protocol CT head was unremarkable EKG shows sinus tachycardia.  Labs show creatinine is increased from 1.17 in May 24 and it is around 2.7.  BUN is 104.  CBC shows hemoglobin of 12.3 platelets 85.  At the time of my exam patient appears completely confused.  Per ER physician patient was alert and awake when ER physician was talking to the patient might be patient became more confused after Ativan.  Review of Systems: As per HPI, rest all negative.   Past Medical History:  Diagnosis Date  . Alcoholism ([place])   . Aortic atherosclerosis ([place]) 12/26/2016   Noted on CT  . Arthritis   . BPH (benign prostatic hyperplasia)   . Chronic constipation    takes magnesium  . Duodenal ulcer   . Dyspnea 12/17/2017  . ED (erectile dysfunction)   . Foley catheter in place    history of no longer in place  . GERD (gastroesophageal reflux disease)   . Heart palpitations    Occ  . Hiatal hernia   . History of adenomatous polyp of colon    tubular adenoma's 2005  . History of Helicobacter pylori infection    2008  . Hypercholesteremia   . Hypertension   . MR (mitral regurgitation)    Mild, noted on ECHO  . Pre-diabetes   . Pulmonary nodule 12/26/2016   noted on CT, 5 x 3 mm nodular opacity right upper lobe    . Rosacea   . Schatzki's ring    last dilated 02/ 2016  . Urinary retention    history of  . Varicocele 02/25/2014   Small to moderate left varicocele    Past Surgical History:  Procedure Laterality Date  . BALLOON DILATION N/A 06/08/2014   Procedure: BALLOON DILATION;  Surgeon: Garlan Fair, MD;  Location: [facility];  Service: Endoscopy;  Laterality: N/A;  . COLONOSCOPY WITH PROPOFOL N/A 06/08/2014   Procedure: COLONOSCOPY WITH PROPOFOL;  Surgeon: Garlan Fair, MD;  Location: [facility];  Service: Endoscopy;  Laterality: N/A;  . ESOPHAGOGASTRODUODENOSCOPY N/A 06/08/2014   Procedure: ESOPHAGOGASTRODUODENOSCOPY (EGD);  Surgeon: Garlan Fair, MD;  Location: [facility];  Service: Endoscopy;  Laterality: N/A;  . ESOPHAGOGASTRODUODENOSCOPY (EGD) WITH ESOPHAGEAL DILATION N/A 12/31/2012   Procedure: ESOPHAGOGASTRODUODENOSCOPY (EGD) WITH ESOPHAGEAL DILATION;  Surgeon: Garlan Fair, MD;  Location: [facility];  Service: Endoscopy;  Laterality: N/A;  . GREEN LIGHT LASER TURP (TRANSURETHRAL RESECTION OF PROSTATE N/A 12/28/2015   Procedure: GREEN LIGHT LASER TURP (TRANSURETHRAL RESECTION OF PROSTATE;  Surgeon: Festus Aloe, MD;  Location: [facility];  Service: Urology;  Laterality: N/A;     reports that he has never smoked. He has never used smokeless tobacco. He reports current alcohol  use. He reports that he does not use drugs.  No Known Allergies  Family History  Problem Relation Age of Onset  . Heart failure Mother   . Stroke Father     Prior to Admission medications   Medication Sig Start Date End Date Taking? Authorizing Provider  atorvastatin (LIPITOR) 20 MG tablet Take 20 mg by mouth daily.   Yes [provider]  lisinopril-hydrochlorothiazide (PRINZIDE,ZESTORETIC) 20-12.5 MG per tablet Take 2 tablets by mouth daily.    Yes [provider]  traZODone (DESYREL) 100 MG tablet Take 100 mg by mouth at bedtime as needed for sleep.    Yes [provider]  chlordiazePOXIDE (LIBRIUM) 25 MG capsule 50mg  PO TID x 1D, then 25-50mg  PO BID X 1D, then 25-50mg  PO QD X 1D Patient not taking: Reported on 10/01/2019 09/01/19   Gareth Morgan, MD  docusate sodium (COLACE) 100 MG capsule Take 1 capsule (100 mg total) by mouth 2 (two) times daily. Ok to stop taking if having loose bowel movements Patient not taking: Reported on 09/01/2019 04/28/18   Clovis Riley, MD  folic acid (FOLVITE) 1 MG tablet Take 1 tablet (1 mg total) by mouth daily. 06/28/19   Mariel Aloe, MD  metoprolol tartrate (LOPRESSOR) 25 MG tablet Take 1 tablet (25 mg total) by mouth 2 (two) times daily. Patient not taking: Reported on 09/01/2019 06/27/19   Mariel Aloe, MD  Multiple Vitamin (MULTIVITAMIN WITH MINERALS) TABS tablet Take 1 tablet by mouth daily. 06/28/19   Mariel Aloe, MD  thiamine 100 MG tablet Take 1 tablet (100 mg total) by mouth daily. Patient not taking: Reported on 09/01/2019 06/28/19   Mariel Aloe, MD    Physical Exam: Constitutional: Moderately built and nourished. Vitals:   10/01/19 0240 10/01/19 0352 10/01/19 0359 10/01/19 0544  BP: 102/73 104/69 98/66 114/71  Pulse: (!) 119 (!) 113 (!) 112 77  Resp: 17   10  Temp: 97.9 F (36.6 C)     TempSrc:      SpO2: 100%  100% 100%   Eyes: Anicteric no pallor. ENMT: No discharge from the ears eyes nose or mouth. Neck: No mass felt.  No neck rigidity. Respiratory: No rhonchi or crepitations. Cardiovascular: S1-S2 heard. Abdomen: Soft nontender bowel sounds present. Musculoskeletal: No edema. Skin: Chronic skin bruises. Neurologic: Appears confused but moves all extremities. Psychiatric: Appears confused.   Labs on Admission: I have personally reviewed following labs and imaging studies  CBC: Recent Labs  Lab 10/01/19 0334  WBC 6.6  NEUTROABS 5.1  HGB 12.3*  HCT 36.1*  MCV 97.6  PLT 85*   Basic Metabolic Panel: Recent Labs  Lab 10/01/19 0334  NA 131*  K  3.9  CL 92*  CO2 22  GLUCOSE 153*  BUN 104*  CREATININE 2.76*  CALCIUM 8.2*  MG 2.7*   GFR: CrCl cannot be calculated (Unknown ideal weight.). Liver Function Tests: Recent Labs  Lab 10/01/19 0334  AST 63*  ALT 64*  ALKPHOS 107  BILITOT 1.6*  PROT 6.7  ALBUMIN 3.7   No results for input(s): LIPASE, AMYLASE in the last 168 hours. No results for input(s): AMMONIA in the last 168 hours. Coagulation Profile: No results for input(s): INR, PROTIME in the last 168 hours. Cardiac Enzymes: No results for input(s): CKTOTAL, CKMB, CKMBINDEX, TROPONINI in the last 168 hours. BNP (last 3 results) No results for input(s): PROBNP in the last 8760 hours. HbA1C: No results for input(s): HGBA1C in the last  72 hours. CBG: Recent Labs  Lab 10/01/19 0428  GLUCAP 160*   Lipid Profile: No results for input(s): CHOL, HDL, LDLCALC, TRIG, CHOLHDL, LDLDIRECT in the last 72 hours. Thyroid Function Tests: No results for input(s): TSH, T4TOTAL, FREET4, T3FREE, THYROIDAB in the last 72 hours. Anemia Panel: No results for input(s): VITAMINB12, FOLATE, FERRITIN, TIBC, IRON, RETICCTPCT in the last 72 hours. Urine analysis:    Component Value Date/Time   COLORURINE YELLOW 09/01/2019 1730   APPEARANCEUR CLEAR 09/01/2019 1730   LABSPEC 1.015 09/01/2019 1730   PHURINE 5.0 09/01/2019 1730   GLUCOSEU NEGATIVE 09/01/2019 1730   HGBUR NEGATIVE 09/01/2019 1730   BILIRUBINUR NEGATIVE 09/01/2019 1730   KETONESUR 5 (A) 09/01/2019 1730   PROTEINUR NEGATIVE 09/01/2019 1730   NITRITE NEGATIVE 09/01/2019 1730   LEUKOCYTESUR NEGATIVE 09/01/2019 1730   Sepsis Labs: @LABRCNTIP (procalcitonin:4,lacticidven:4) )No results found for this or any previous visit (from the past 240 hour(s)).   Radiological Exams on Admission: CT Head Wo Contrast  Result Date: 10/01/2019 CLINICAL DATA:  Multiple falls. Patient feels off balance. Fall yesterday with trauma to head. EXAM: CT HEAD WITHOUT CONTRAST TECHNIQUE:  Contiguous axial images were obtained from the base of the skull through the vertex without intravenous contrast. COMPARISON:  CT head without contrast 07/30/2019 at [place] and 06/21/2019 at [place]: Brain: Mild atrophy and white matter changes are likely within normal limits for age. There is no significant interval change. No acute infarct, hemorrhage, or mass lesion is present. The ventricles are of normal size. No significant extraaxial fluid collection is present. Vascular: Atherosclerotic calcifications are present within the cavernous internal carotid arteries and at the dural margin of the left vertebral artery. No hyperdense vessel is present. Skull: Calvarium is intact. No focal lytic or blastic lesions are present. No significant extracranial soft tissue lesion is present. Sinuses/Orbits: Minimal mucosal thickening is present within the anterior ethmoid air cells. Remaining paranasal sinuses and mastoid air cells are clear. The globes and orbits are within normal limits. IMPRESSION: 1. No acute intracranial abnormality or significant interval change. 2. Stable atrophy and white matter disease, likely within normal limits for age. 3. Atherosclerosis. Electronically Signed   By: San Morelle M.D.   On: 10/01/2019 05:25    EKG: Independently reviewed.  Sinus tachycardia.  Assessment/Plan Principal Problem:   ARF (acute renal failure) (HCC) Active Problems:   Essential hypertension   Alcohol withdrawal (HCC)   Normochromic normocytic anemia    1. Acute renal failure cause not clear.  Could be from poor intake and patient is also on ACE inhibitor.  We will continue with aggressive hydration patient has received a liter bolus and I kept patient on normal saline at 150/h.  Follow UA and a urine sodium creatinine.  If creatinine does not improve with hydration may need further imaging.  In addition CK levels have been  ordered. 2. Acute encephalopathy with initial symptoms are concerning for withdrawal from alcohol -at the time of my exam patient has become very confused.  CT head is unremarkable.  Patient's confusion has worsened after patient got Ativan as per the ER physician.  I am holding off the CIWA protocol at this time.  We will keep patient on thiamine check thiamine levels before that and check ammonia ABG and closely monitor.  Keeping patient n.p.o. for now check CBGs. 3. Alcohol withdrawal see #2. 4. History of hypertension presently due to acute renal failure and low normal blood pressure  we will keep patient n.p.o. IV hydralazine for systolic more than 917. 5. Frequent falls CT head is unremarkable x-ray pelvis is pending CK levels are pending. 6. Normocytic normochromic anemia follow CBC. 7. Thrombocytopenia appears to be chronic could be from alcoholism.  However since worsening renal function I have ordered LDH and smear review to make sure there is no schistocytes or hemolytic process.  Given that patient has acute renal failure with acute encephalopathy alcohol withdrawal will need close monitoring pediatrician inpatient status.   DVT prophylaxis: SCDs for now since patient has thrombocytopenia. Code Status: Full code.   Family Communication: No family at the bedside. Disposition Plan: To be determined. Consults called: None. Admission status: Inpatient.   Rise Patience MD [facility] Pager [phone].  If 7PM-7AM, please contact night-coverage [email or web address] Password [facility]  10/01/2019, 6:28 AM

## 2019-10-01 NOTE — Progress Notes (Signed)
Notified Lab that ABG being sent for analysis. 

## 2019-10-02 DIAGNOSIS — F10230 Alcohol dependence with withdrawal, uncomplicated: Secondary | ICD-10-CM

## 2019-10-02 LAB — CBC WITH DIFFERENTIAL/PLATELET
Abs Immature Granulocytes: 0.02 10*3/uL (ref 0.00–0.07)
Basophils Absolute: 0 10*3/uL (ref 0.0–0.1)
Basophils Relative: 0 %
Eosinophils Absolute: 0.1 10*3/uL (ref 0.0–0.5)
Eosinophils Relative: 1 %
HCT: 35.6 % — ABNORMAL LOW (ref 39.0–52.0)
Hemoglobin: 12.2 g/dL — ABNORMAL LOW (ref 13.0–17.0)
Immature Granulocytes: 0 %
Lymphocytes Relative: 9 %
Lymphs Abs: 0.4 10*3/uL — ABNORMAL LOW (ref 0.7–4.0)
MCH: 33.4 pg (ref 26.0–34.0)
MCHC: 34.3 g/dL (ref 30.0–36.0)
MCV: 97.5 fL (ref 80.0–100.0)
Monocytes Absolute: 0.9 10*3/uL (ref 0.1–1.0)
Monocytes Relative: 18 %
Neutro Abs: 3.7 10*3/uL (ref 1.7–7.7)
Neutrophils Relative %: 72 %
Platelets: 102 10*3/uL — ABNORMAL LOW (ref 150–400)
RBC: 3.65 MIL/uL — ABNORMAL LOW (ref 4.22–5.81)
RDW: 12.7 % (ref 11.5–15.5)
WBC: 5.2 10*3/uL (ref 4.0–10.5)
nRBC: 0 % (ref 0.0–0.2)

## 2019-10-02 LAB — COMPREHENSIVE METABOLIC PANEL
ALT: 51 U/L — ABNORMAL HIGH (ref 0–44)
AST: 46 U/L — ABNORMAL HIGH (ref 15–41)
Albumin: 3.2 g/dL — ABNORMAL LOW (ref 3.5–5.0)
Alkaline Phosphatase: 89 U/L (ref 38–126)
Anion gap: 11 (ref 5–15)
BUN: 56 mg/dL — ABNORMAL HIGH (ref 8–23)
CO2: 24 mmol/L (ref 22–32)
Calcium: 8.3 mg/dL — ABNORMAL LOW (ref 8.9–10.3)
Chloride: 98 mmol/L (ref 98–111)
Creatinine, Ser: 1.41 mg/dL — ABNORMAL HIGH (ref 0.61–1.24)
GFR calc Af Amer: 59 mL/min — ABNORMAL LOW (ref >60)
GFR calc non Af Amer: 51 mL/min — ABNORMAL LOW (ref >60)
Glucose, Bld: 174 mg/dL — ABNORMAL HIGH (ref 70–99)
Potassium: 3.3 mmol/L — ABNORMAL LOW (ref 3.5–5.1)
Sodium: 133 mmol/L — ABNORMAL LOW (ref 135–145)
Total Bilirubin: 1.3 mg/dL — ABNORMAL HIGH (ref 0.3–1.2)
Total Protein: 5.9 g/dL — ABNORMAL LOW (ref 6.5–8.1)

## 2019-10-02 LAB — GLUCOSE, CAPILLARY
Glucose-Capillary: 140 mg/dL — ABNORMAL HIGH (ref 70–99)
Glucose-Capillary: 130 mg/dL — ABNORMAL HIGH (ref 70–99)
Glucose-Capillary: 325 mg/dL — ABNORMAL HIGH (ref 70–99)
Glucose-Capillary: 190 mg/dL — ABNORMAL HIGH (ref 70–99)
Glucose-Capillary: 132 mg/dL — ABNORMAL HIGH (ref 70–99)

## 2019-10-02 LAB — CK: Total CK: 290 U/L (ref 49–397)

## 2019-10-02 MED ORDER — TRAMADOL HCL 50 MG PO TABS
50.0000 mg | ORAL_TABLET | Freq: Four times a day (QID) | ORAL | Status: DC | PRN
  Administered 2019-10-02: 50 mg via ORAL
  Filled 2019-10-02: qty 1

## 2019-10-02 MED ORDER — ADULT MULTIVITAMIN W/MINERALS CH
1.0000 | ORAL_TABLET | Freq: Every day | ORAL | Status: DC
  Administered 2019-10-02 – 2019-10-06 (×5): 1 via ORAL
  Filled 2019-10-02 (×5): qty 1

## 2019-10-02 MED ORDER — SODIUM CHLORIDE 0.9 % IV SOLN: INTRAVENOUS | Status: AC

## 2019-10-02 MED ORDER — INSULIN ASPART 100 UNIT/ML ~~LOC~~ SOLN
0.0000 [IU] | Freq: Three times a day (TID) | SUBCUTANEOUS | Status: DC
  Administered 2019-10-03 (×2): 2 [IU] via SUBCUTANEOUS
  Administered 2019-10-04 – 2019-10-05 (×5): 1 [IU] via SUBCUTANEOUS
  Administered 2019-10-06: 2 [IU] via SUBCUTANEOUS

## 2019-10-02 MED ORDER — POTASSIUM CHLORIDE CRYS ER 20 MEQ PO TBCR
40.0000 meq | EXTENDED_RELEASE_TABLET | Freq: Once | ORAL | Status: AC
  Administered 2019-10-02: 40 meq via ORAL
  Filled 2019-10-02: qty 2

## 2019-10-02 MED ORDER — PANTOPRAZOLE SODIUM 40 MG PO TBEC
40.0000 mg | DELAYED_RELEASE_TABLET | Freq: Every day | ORAL | Status: DC
  Administered 2019-10-03 – 2019-10-06 (×4): 40 mg via ORAL
  Filled 2019-10-02 (×4): qty 1

## 2019-10-02 MED ORDER — ENSURE ENLIVE PO LIQD
237.0000 mL | Freq: Two times a day (BID) | ORAL | Status: DC
  Administered 2019-10-02 – 2019-10-06 (×8): 237 mL via ORAL

## 2019-10-02 MED ORDER — THIAMINE HCL 100 MG PO TABS
100.0000 mg | ORAL_TABLET | Freq: Every day | ORAL | Status: DC
  Administered 2019-10-03 – 2019-10-06 (×4): 100 mg via ORAL
  Filled 2019-10-02 (×4): qty 1

## 2019-10-02 MED ORDER — PANTOPRAZOLE SODIUM 40 MG PO TBEC: 40.0000 mg | DELAYED_RELEASE_TABLET | Freq: Two times a day (BID) | ORAL | Status: DC

## 2019-10-02 NOTE — Evaluation (Signed)
Physical Therapy Evaluation Patient Details Name: Ruben [name] MRN: [id number] DOB: October 01, 1950 Today's Date: 10/02/2019   History of Present Illness  69 y.o. male with history of alcoholism who has not had any alcohol for last 2 days since patient was trying to detox himself presents to the ER because he has been having falls frequently last 2 days.  Has not complained of any chest pain or shortness of breath nausea vomiting or diarrhea.  Clinical Impression  Pt admitted with above diagnosis. Pt with generalized weakness and HR limiting functional abilities. Pt requires mod A to come to sitting at EOB with HR elevating to 130-140s after 2 min returned pt to supine; 147 bpm max HR noted by therapist and SpO2 100% on RA. Pt able to roll in bed with assist and HR in 120s, 106 bpm at EOS supine in bed. Pt continually conversates with therapist, no SOB, denies chest pain or racing sensation, only reports feeling "tired and weak". Pt wanting to d/c to alcohol rehab and motivated to regain strength in order to attend; only has neighbors and cousin in [place] able to provide assistance so recommending SNF due to weakness/assistance level at this time. Pt currently with functional limitations due to the deficits listed below (see PT Problem List). Pt will benefit from skilled PT to increase their independence and safety with mobility to allow discharge to the venue listed below.       Follow Up Recommendations SNF;Other (comment) (vs. Rehab)    Equipment Recommendations  Other (comment) (TBD)    Recommendations for Other Services       Precautions / Restrictions Precautions Precautions: Fall Precaution Comments: monitor HR Restrictions Weight Bearing Restrictions: No      Mobility  Bed Mobility Overal bed mobility: Needs Assistance Bed Mobility: Supine to Sit;Sit to Supine;Rolling Rolling: Min assist  Supine to sit: Mod assist;HOB elevated Sit to supine: Min assist  General bed mobility  comments: mod A to upright trunk and min A to assist BLE to EOB due to weakness, min A to lift BLE back into bed; min A to roll in bed, cues to use bedrails to improve independence  Transfers  General transfer comment: unable to attempt 2* HR  Ambulation/Gait  General Gait Details: unable to attempt 2* HR  Stairs            Wheelchair Mobility    Modified Rankin (Stroke Patients Only)       Balance Overall balance assessment: Needs assistance Sitting-balance support: Feet supported;Bilateral upper extremity supported Sitting balance-Leahy Scale: Fair Sitting balance - Comments: close SUPV seated EOB          Pertinent Vitals/Pain Pain Assessment: No/denies pain    Home Living Family/patient expects to be discharged to:: Private residence Living Arrangements: Alone Available Help at Discharge: Family;Neighbor;Available PRN/intermittently (Cousin in [place]) Type of Home: House Home Access: Stairs to enter Entrance Stairs-Rails: None Technical brewer of Steps: 2 Home Layout: One level Home Equipment: Environmental consultant - 2 wheels      Prior Function Level of Independence: Independent         Comments: Pt reports Ind with ADLs, community ambulator without AD, drives, retired. Pt reports using RW this past Saturday-Monday for unsteadiness due to withdrawals with a couple falls.     Hand Dominance   Dominant Hand: Right    Extremity/Trunk Assessment   Upper Extremity Assessment Upper Extremity Assessment: Defer to OT evaluation    Lower Extremity Assessment Lower Extremity Assessment: Generalized weakness;RLE deficits/detail;LLE  deficits/detail RLE Deficits / Details: hip/knee AROM <50%, strength 2+/5; ankle AROM WNL, strength 3/5 RLE Sensation: decreased light touch LLE Deficits / Details: hip/knee AROM <50%, strength 2+/5; ankle AROM WNL, strength 3/5 LLE Sensation: decreased light touch    Cervical / Trunk Assessment Cervical / Trunk Assessment:  Normal  Communication   Communication: No difficulties  Cognition Arousal/Alertness: Awake/alert Behavior During Therapy: WFL for tasks assessed/performed Overall Cognitive Status: Within Functional Limits for tasks assessed         General Comments General comments (skin integrity, edema, etc.): seated EOB ~2 min with elevated HR, 147 bpm max noted by therapist and sustained in 140s, pt verbal, SpO2 100% on RA, denies chest pain, only reports feeling tired and weak; returned to supine    Exercises     Assessment/Plan    PT Assessment Patient needs continued PT services  PT Problem List Decreased strength;Decreased range of motion;Decreased activity tolerance;Decreased balance;Decreased mobility;Decreased coordination;Decreased safety awareness;Impaired sensation       PT Treatment Interventions DME instruction;Gait training;Functional mobility training;Therapeutic activities;Therapeutic exercise;Balance training;Neuromuscular re-education;Patient/family education    PT Goals (Current goals can be found in the Care Plan section)  Acute Rehab PT Goals Patient Stated Goal: "go to rehab" PT Goal Formulation: With patient Time For Goal Achievement: 10/16/19 Potential to Achieve Goals: Good    Frequency Min 3X/week   Barriers to discharge        Co-evaluation               AM-PAC PT "6 Clicks" Mobility  Outcome Measure Help needed turning from your back to your side while in a flat bed without using bedrails?: A Lot Help needed moving from lying on your back to sitting on the side of a flat bed without using bedrails?: A Lot Help needed moving to and from a bed to a chair (including a wheelchair)?: A Lot Help needed standing up from a chair using your arms (e.g., wheelchair or bedside chair)?: A Lot Help needed to walk in hospital room?: A Lot Help needed climbing 3-5 steps with a railing? : Total 6 Click Score: 11    End of Session   Activity Tolerance: Other  (comment) (limited by HR) Patient left: in bed;with call bell/phone within reach;with bed alarm set Nurse Communication: Mobility status;Other (comment) (RN entered room 2* HR) PT Visit Diagnosis: Other abnormalities of gait and mobility (R26.89);Repeated falls (R29.6);Muscle weakness (generalized) (M62.81)    Time: 2035-5974 PT Time Calculation (min) (ACUTE ONLY): 23 min   Charges:   PT Evaluation $PT Eval Moderate Complexity: 1 Mod PT Treatments $Therapeutic Activity: 8-22 mins        Tori Coltrane Tugwell PT, DPT 10/02/19, 11:10 AM

## 2019-10-02 NOTE — Progress Notes (Signed)
Patient was received at shift change to room 1444 via stretcher.  A&Ox4.  Skin warm, dry to touch with bruising noted to abdomen and both arms and legs scabs x 2 to left elbow, redness to heels, not open.  IV site to left hand intact and flushes without difficulty, connected to IVF 0.9% NS @150  ml/hr.  Skin assessment completed by this RN and Curt Bears, RN.  Will continue to monitor.

## 2019-10-02 NOTE — Progress Notes (Signed)
Initial Nutrition Assessment  INTERVENTION:   -Ensure Enlive po BID, each supplement provides 350 kcal and 20 grams of protein -Multivitamin with minerals daily  NUTRITION DIAGNOSIS:   Increased nutrient needs related to  (substance abuse, ETOH) as evidenced by estimated needs.  GOAL:   Patient will meet greater than or equal to 90% of their needs  MONITOR:   PO intake, Supplement acceptance, Labs, Weight trends, I & O's  REASON FOR ASSESSMENT:   Malnutrition Screening Tool    ASSESSMENT:   69 y.o. male with history of alcoholism who has not had any alcohol for last 2 days since patient was trying to detox himself presents to the ER because he has been having falls frequently last 2 days.  Admitted for acute renal failure and acute metabolic encephalopathy.  **RD working remotely**  Per chart review, pt reports drinking 12 oz of vodka daily.  Pt currently consuming 50% of meals at this time. Would benefit from nutritional supplements as well as daily MVI.  Per weight records, pt has lost 21 lbs since 1/14 (10% wt loss x 5 months, significant for time frame).  Medications: KLOR-CON, Thiamine Labs reviewed: CBGs: 130-325 Low Na, K  NUTRITION - FOCUSED PHYSICAL EXAM:  RD working remotely.  Diet Order:   Diet Order            Diet regular Room service appropriate? Yes; Fluid consistency: Thin  Diet effective now                 EDUCATION NEEDS:   No education needs have been identified at this time  Skin:  Skin Assessment: Reviewed RN Assessment  Last BM:  6/20  Height:   Ht Readings from Last 1 Encounters:  10/02/19 5\' 11"  (1.803 m)    Weight:   Wt Readings from Last 1 Encounters:  10/02/19 78.9 kg   BMI:  Body mass index is 24.26 kg/m.  Estimated Nutritional Needs:   Kcal:  2150-2350  Protein:  105-120g  Fluid:  2.1L/day  Ruben Bibles, MS, RD, LDN Inpatient Clinical Dietitian Contact information available via Amion

## 2019-10-02 NOTE — Progress Notes (Signed)
The patient is receiving Protonix& thiamine by the intravenous route.  Based on criteria approved by the Pharmacy and Between, the medication is being converted to the equivalent oral dose form.  These criteria include: -No active GI bleeding -Able to tolerate diet of full liquids (or better) or tube feeding -Able to tolerate other medications by the oral or enteral route  If you have any questions about this conversion, please contact the Pharmacy Department (phone [phone]).  Thank you.

## 2019-10-02 NOTE — Progress Notes (Signed)
OT Cancellation Note  Patient Details Name: Ruben [name] MRN: [id number] DOB: 1950-10-23   Cancelled Treatment:    Reason Eval/Treat Not Completed: Medical issues which prohibited therapy. Spoke with PT who states patient HR staying in high 140s with sitting EOB, and RN request patient return to bed. Will re-attempt 6/25 as medically able.   Delbert Phenix OT Pager: [phone] 10/02/2019, 1:23 PM

## 2019-10-02 NOTE — Progress Notes (Signed)
PROGRESS NOTE  Ruben [name] TDV:[id number] DOB: 1951/02/03 DOA: 10/01/2019 PCP: [name], MD  HPI/Recap of past 24 hours: HPI from Dr [name] is a 69 y.o. male with history of alcoholism who has not had any alcohol for last 2 days since patient was trying to detox himself presents to the ER because he has been having falls frequently last 2 days.  Has not complained of any chest pain or shortness of breath nausea vomiting or diarrhea.  Most of the history was obtained from the ER physician since patient is confused at the time of my exam. In the ER patient was found to be diaphoretic and tremulous.  Patient was placed on CIWA protocol, CT head was unremarkable, EKG shows sinus tachycardia.  Labs show creatinine which has increased from 1.17 in May 24 to around 2.7.  BUN is 104.  CBC shows hemoglobin of 12.3 platelets 85.  At the time of my exam patient appears completely confused.  Per ER physician patient was alert and awake when ER physician saw pt. Confusion worsened probably likely 2/2 Ativan.    Today, patient noted to be more awake, oriented, reports he drinks about 12 ounces of vodka daily, and has been having frequent falls at home unable to ambulate.  Currently denies any abdominal pain, chest pain, nausea/vomiting, fever/chills, shortness of breath.  Strength equal in all extremities, no obvious focal neurologic deficits noted    Assessment/Plan: Principal Problem:   ARF (acute renal failure) (HCC) Active Problems:   Essential hypertension   Alcohol withdrawal (HCC)   Normochromic normocytic anemia  Acute metabolic encephalopathy Likely 2/2 alcohol withdrawal/medication induced Currently afebrile, with no leukocytosis Ammonia level 29, mildly elevated LFTs with T bili 1.3 UA unremarkable for infection, UC pending Chest x-ray unremarkable Right upper quadrant ultrasound noted fatty liver CT head unremarkable Advance diet as tolerated  Mild  rhabdomyolysis Resolved CK level 653 on admission  AKI Likely 2/2 poor oral intake, in addition to rhabdo Continue IV fluids Daily CMP  Hyponatremia Likely 2/2 poor oral intake/alcohol abuse Daily CMP  Thrombocytopenia/vitamin B12 DEF Likely 2/2 alcohol abuse Anemia panel showed iron 39, TIBC 153, sats 26, ferritin 950, folate 10.4, vitamin B12 265 Continue vitamin B12 supplements Daily CBC  Alcohol abuse/withdrawal Continue IV fluids, thiamine, B12 supplementation Advised to quit Hold off CIWA protocol for now as not requiring   Hypertension BP stable Monitor  Frequent falls Likely 2/2 alcohol abuse/neuropathy CT head unremarkable X-ray pelvis unremarkable PT/OT Fall precautions       Malnutrition Type:      Malnutrition Characteristics:      Nutrition Interventions:       Estimated body mass index is 24.26 kg/m as calculated from the following:   Height as of this encounter: 5\' 11"  (1.803 m).   Weight as of this encounter: 78.9 kg.     Code Status: Full  Family Communication: Plan to discuss with family, none at bedside  Disposition Plan: Status is: Inpatient  Remains inpatient appropriate because:Inpatient level of care appropriate due to severity of illness   Dispo: The patient is from: Home              Anticipated d/c is to: SNF              Anticipated d/c date is: 2 days              Patient currently is not medically stable to d/c.    Consultants:  None  Procedures:  None  Antimicrobials:  None  DVT prophylaxis: SCDs   Objective: Vitals:   10/02/19 0041 10/02/19 0320 10/02/19 0644 10/02/19 1242  BP: (!) 141/81 (!) 142/78 130/89 139/85  Pulse: (!) 57 (!) 57 62 71  Resp: 18 18 18 18   Temp: 98.2 F (36.8 C)  97.9 F (36.6 C) 98.3 F (36.8 C)  TempSrc: Oral  Oral Oral  SpO2:  99% 98% 98%  Weight: 78.9 kg     Height: 5\' 11"  (1.803 m)       Intake/Output Summary (Last 24 hours) at 10/02/2019 1548 Last data  filed at 10/02/2019 0600 Gross per 24 hour  Intake 3490.62 ml  Output --  Net 3490.62 ml   Filed Weights   10/02/19 0041  Weight: 78.9 kg    Exam:  General: NAD, oriented   Cardiovascular: S1, S2 present  Respiratory: CTAB  Abdomen: Soft, nontender, nondistended, bowel sounds present  Musculoskeletal: No bilateral pedal edema noted  Skin: Normal  Psychiatry:  Normal mood  Neurology: Strength equal in all extremities, no obvious focal neurologic deficits noted   Data Reviewed: CBC: Recent Labs  Lab 10/01/19 0334 10/01/19 0758 10/02/19 0429  WBC 6.6 3.8* 5.2  NEUTROABS 5.1 2.8 3.7  HGB 12.3* 8.8* 12.2*  HCT 36.1* 25.8* 35.6*  MCV 97.6 98.1 97.5  PLT 85* 65* 440*   Basic Metabolic Panel: Recent Labs  Lab 10/01/19 0334 10/01/19 0758 10/02/19 0429  NA 131* 132* 133*  K 3.9 3.5 3.3*  CL 92* 95* 98  CO2 22 22 24   GLUCOSE 153* 135* 174*  BUN 104* 89* 56*  CREATININE 2.76* 2.19* 1.41*  CALCIUM 8.2* 7.6* 8.3*  MG 2.7*  --   --    GFR: Estimated Creatinine Clearance: 54.1 mL/min (A) (by C-G formula based on SCr of 1.41 mg/dL (H)). Liver Function Tests: Recent Labs  Lab 10/01/19 0334 10/01/19 0758 10/02/19 0429  AST 63* 52* 46*  ALT 64* 56* 51*  ALKPHOS 107 91 89  BILITOT 1.6* 1.3* 1.3*  PROT 6.7 5.8* 5.9*  ALBUMIN 3.7 3.2* 3.2*   No results for input(s): LIPASE, AMYLASE in the last 168 hours. Recent Labs  Lab 10/01/19 0823  AMMONIA 29   Coagulation Profile: No results for input(s): INR, PROTIME in the last 168 hours. Cardiac Enzymes: Recent Labs  Lab 10/01/19 0758 10/02/19 0429  CKTOTAL 653* 290   BNP (last 3 results) No results for input(s): PROBNP in the last 8760 hours. HbA1C: No results for input(s): HGBA1C in the last 72 hours. CBG: Recent Labs  Lab 10/01/19 1136 10/01/19 1741 10/02/19 0102 10/02/19 0642 10/02/19 1129  GLUCAP 118* 107* 140* 130* 325*   Lipid Profile: No results for input(s): CHOL, HDL, LDLCALC, TRIG,  CHOLHDL, LDLDIRECT in the last 72 hours. Thyroid Function Tests: Recent Labs    10/01/19 0758  TSH 1.944   Anemia Panel: Recent Labs    10/01/19 0758  VITAMINB12 265  FOLATE 10.4  FERRITIN 950*  TIBC 153*  IRON 39*  RETICCTPCT 0.5   Urine analysis:    Component Value Date/Time   COLORURINE YELLOW 10/01/2019 1146   APPEARANCEUR TURBID (A) 10/01/2019 1146   LABSPEC 1.014 10/01/2019 1146   PHURINE 5.0 10/01/2019 1146   GLUCOSEU 50 (A) 10/01/2019 1146   HGBUR SMALL (A) 10/01/2019 1146   BILIRUBINUR NEGATIVE 10/01/2019 1146   KETONESUR 5 (A) 10/01/2019 1146   PROTEINUR NEGATIVE 10/01/2019 1146   NITRITE NEGATIVE 10/01/2019 1146   LEUKOCYTESUR  NEGATIVE 10/01/2019 1146   Sepsis Labs: @LABRCNTIP (procalcitonin:4,lacticidven:4)  ) Recent Results (from the past 240 hour(s))  SARS Coronavirus 2 by RT PCR (hospital order, performed in [facility] hospital lab) Nasopharyngeal Nasopharyngeal Swab     Status: None   Collection Time: 10/01/19  4:59 AM   Specimen: Nasopharyngeal Swab  Result Value Ref Range Status   SARS Coronavirus 2 NEGATIVE NEGATIVE Final    Comment: (NOTE) SARS-CoV-2 target nucleic acids are NOT DETECTED.  The SARS-CoV-2 RNA is generally detectable in upper and lower respiratory specimens during the acute phase of infection. The lowest concentration of SARS-CoV-2 viral copies this assay can detect is 250 copies / mL. A negative result does not preclude SARS-CoV-2 infection and should not be used as the sole basis for treatment or other patient management decisions.  A negative result may occur with improper specimen collection / handling, submission of specimen other than nasopharyngeal swab, presence of viral mutation(s) within the areas targeted by this assay, and inadequate number of viral copies (<250 copies / mL). A negative result must be combined with clinical observations, patient history, and epidemiological information.  Fact Sheet for Patients:    StrictlyIdeas.no  Fact Sheet for Healthcare Providers: BankingDealers.co.za  This test is not yet approved or  cleared by the [place] FDA and has been authorized for detection and/or diagnosis of SARS-CoV-2 by FDA under an Emergency Use Authorization (EUA).  This EUA will remain in effect (meaning this test can be used) for the duration of the COVID-19 declaration under Section 564(b)(1) of the Act, 21 U.S.C. section 360bbb-3(b)(1), unless the authorization is terminated or revoked sooner.  Performed at [facility], [place] [address] [place], [place] [address]       Studies: DG Chest Port 1 View  Result Date: 10/01/2019 CLINICAL DATA:  Detox EXAM: PORTABLE CHEST 1 VIEW COMPARISON:  03/31/2019 FINDINGS: The heart size and mediastinal contours are within normal limits. Non inclusion of left CP angle. Both lungs are clear. The visualized skeletal structures are unremarkable. IMPRESSION: No active disease. Electronically Signed   By: Donavan Foil M.D.   On: 10/01/2019 16:15   US Abdomen Limited RUQ  Result Date: 10/01/2019 CLINICAL DATA:  Elevated liver function tests. EXAM: ULTRASOUND ABDOMEN LIMITED RIGHT UPPER QUADRANT COMPARISON:  None. FINDINGS: Gallbladder: No gallstones or wall thickening visualized (1.7 mm). No sonographic Murphy sign noted by sonographer. Common bile duct: Diameter: 3.5 mm Liver: No focal lesion identified. The left lobe of the liver is limited in evaluation secondary to overlying bowel gas. Diffusely increased echogenicity of the liver parenchyma is noted. Portal vein is patent on color Doppler imaging with normal direction of blood flow towards the liver. Other: None. IMPRESSION: Fatty liver. Electronically Signed   By: Virgina Norfolk M.D.   On: 10/01/2019 16:34    Scheduled Meds: . feeding supplement (ENSURE ENLIVE)  237 mL Oral BID BM  . multivitamin with minerals  1 tablet Oral Daily    . pantoprazole  40 mg Oral BID  . [START ON 10/03/2019] thiamine  100 mg Oral Daily  . vitamin B-12  1,000 mcg Oral Daily    Continuous Infusions: . sodium chloride 100 mL/hr at 10/02/19 0934     LOS: 1 day     Alma Friendly, MD [facility]  If 7PM-7AM, please contact night-coverage [email or web address] 10/02/2019, 3:48 PM

## 2019-10-03 LAB — URINE CULTURE: Culture: <10000 — AB

## 2019-10-03 LAB — COMPREHENSIVE METABOLIC PANEL
ALT: 41 U/L (ref 0–44)
AST: 33 U/L (ref 15–41)
Albumin: 2.9 g/dL — ABNORMAL LOW (ref 3.5–5.0)
Alkaline Phosphatase: 81 U/L (ref 38–126)
Anion gap: 8 (ref 5–15)
BUN: 25 mg/dL — ABNORMAL HIGH (ref 8–23)
CO2: 26 mmol/L (ref 22–32)
Calcium: 7.9 mg/dL — ABNORMAL LOW (ref 8.9–10.3)
Chloride: 101 mmol/L (ref 98–111)
Creatinine, Ser: 1.11 mg/dL (ref 0.61–1.24)
GFR calc Af Amer: >60 mL/min (ref >60)
GFR calc non Af Amer: >60 mL/min (ref >60)
Glucose, Bld: 125 mg/dL — ABNORMAL HIGH (ref 70–99)
Potassium: 3.5 mmol/L (ref 3.5–5.1)
Sodium: 135 mmol/L (ref 135–145)
Total Bilirubin: 0.9 mg/dL (ref 0.3–1.2)
Total Protein: 5.7 g/dL — ABNORMAL LOW (ref 6.5–8.1)

## 2019-10-03 LAB — HEMOGLOBIN A1C
Hgb A1c MFr Bld: 5.7 % — ABNORMAL HIGH (ref 4.8–5.6)
Mean Plasma Glucose: 116.89 mg/dL

## 2019-10-03 LAB — CBC WITH DIFFERENTIAL/PLATELET
Abs Immature Granulocytes: 0.04 10*3/uL (ref 0.00–0.07)
Basophils Absolute: 0 10*3/uL (ref 0.0–0.1)
Basophils Relative: 0 %
Eosinophils Absolute: 0.1 10*3/uL (ref 0.0–0.5)
Eosinophils Relative: 2 %
HCT: 36.8 % — ABNORMAL LOW (ref 39.0–52.0)
Hemoglobin: 12.1 g/dL — ABNORMAL LOW (ref 13.0–17.0)
Immature Granulocytes: 1 %
Lymphocytes Relative: 12 %
Lymphs Abs: 0.8 10*3/uL (ref 0.7–4.0)
MCH: 32.5 pg (ref 26.0–34.0)
MCHC: 32.9 g/dL (ref 30.0–36.0)
MCV: 98.9 fL (ref 80.0–100.0)
Monocytes Absolute: 1.2 10*3/uL — ABNORMAL HIGH (ref 0.1–1.0)
Monocytes Relative: 19 %
Neutro Abs: 4.4 10*3/uL (ref 1.7–7.7)
Neutrophils Relative %: 66 %
Platelets: 140 10*3/uL — ABNORMAL LOW (ref 150–400)
RBC: 3.72 MIL/uL — ABNORMAL LOW (ref 4.22–5.81)
RDW: 12.6 % (ref 11.5–15.5)
WBC: 6.6 10*3/uL (ref 4.0–10.5)
nRBC: 0 % (ref 0.0–0.2)

## 2019-10-03 LAB — GLUCOSE, CAPILLARY
Glucose-Capillary: 99 mg/dL (ref 70–99)
Glucose-Capillary: 110 mg/dL — ABNORMAL HIGH (ref 70–99)
Glucose-Capillary: 174 mg/dL — ABNORMAL HIGH (ref 70–99)
Glucose-Capillary: 156 mg/dL — ABNORMAL HIGH (ref 70–99)
Glucose-Capillary: 108 mg/dL — ABNORMAL HIGH (ref 70–99)

## 2019-10-03 MED ORDER — CHLORDIAZEPOXIDE HCL 25 MG PO CAPS
25.0000 mg | ORAL_CAPSULE | Freq: Four times a day (QID) | ORAL | Status: AC
  Administered 2019-10-03 (×4): 25 mg via ORAL
  Filled 2019-10-03 (×4): qty 1

## 2019-10-03 MED ORDER — METOPROLOL TARTRATE 25 MG PO TABS: 25.0000 mg | ORAL_TABLET | Freq: Two times a day (BID) | ORAL | Status: DC

## 2019-10-03 MED ORDER — METOPROLOL TARTRATE 25 MG PO TABS
25.0000 mg | ORAL_TABLET | Freq: Two times a day (BID) | ORAL | Status: DC
  Administered 2019-10-03 – 2019-10-06 (×6): 25 mg via ORAL
  Filled 2019-10-03 (×6): qty 1

## 2019-10-03 MED ORDER — LORAZEPAM 2 MG/ML IJ SOLN: 1.0000 mg | INTRAMUSCULAR | Status: AC | PRN

## 2019-10-03 MED ORDER — CHLORDIAZEPOXIDE HCL 25 MG PO CAPS
25.0000 mg | ORAL_CAPSULE | ORAL | Status: AC
  Administered 2019-10-05 (×2): 25 mg via ORAL
  Filled 2019-10-03 (×2): qty 1

## 2019-10-03 MED ORDER — CHLORDIAZEPOXIDE HCL 25 MG PO CAPS
25.0000 mg | ORAL_CAPSULE | Freq: Three times a day (TID) | ORAL | Status: AC
  Administered 2019-10-04 (×3): 25 mg via ORAL
  Filled 2019-10-03 (×3): qty 1

## 2019-10-03 MED ORDER — LORAZEPAM 1 MG PO TABS
1.0000 mg | ORAL_TABLET | ORAL | Status: AC | PRN
  Administered 2019-10-04: 1 mg via ORAL
  Filled 2019-10-03: qty 1

## 2019-10-03 MED ORDER — POLYVINYL ALCOHOL 1.4 % OP SOLN
1.0000 [drp] | OPHTHALMIC | Status: DC | PRN
  Administered 2019-10-03: 1 [drp] via OPHTHALMIC
  Filled 2019-10-03 (×2): qty 15

## 2019-10-03 MED ORDER — CHLORDIAZEPOXIDE HCL 25 MG PO CAPS
25.0000 mg | ORAL_CAPSULE | Freq: Every day | ORAL | Status: AC
  Administered 2019-10-06: 25 mg via ORAL
  Filled 2019-10-03: qty 1

## 2019-10-03 MED ORDER — ENOXAPARIN SODIUM 40 MG/0.4ML ~~LOC~~ SOLN
40.0000 mg | SUBCUTANEOUS | Status: DC
  Administered 2019-10-03 – 2019-10-05 (×3): 40 mg via SUBCUTANEOUS
  Filled 2019-10-03 (×2): qty 0.4

## 2019-10-03 MED ORDER — POLYETHYLENE GLYCOL 3350 17 G PO PACK
17.0000 g | PACK | Freq: Every day | ORAL | Status: DC | PRN
  Administered 2019-10-04 – 2019-10-05 (×2): 17 g via ORAL
  Filled 2019-10-03 (×2): qty 1

## 2019-10-03 MED ORDER — SENNOSIDES-DOCUSATE SODIUM 8.6-50 MG PO TABS
1.0000 | ORAL_TABLET | Freq: Two times a day (BID) | ORAL | Status: DC
  Administered 2019-10-03 – 2019-10-06 (×7): 1 via ORAL
  Filled 2019-10-03 (×7): qty 1

## 2019-10-03 NOTE — Care Management Important Message (Signed)
Important Message  Patient Details IM Letter given to Gabriel Earing RN Case Manager to present to the Patient Name: Ruben [name] MRN: [id number] Date of Birth: Jan 22, 1951   Medicare Important Message Given:  Yes     Kerin Salen 10/03/2019, 10:35 AM

## 2019-10-03 NOTE — NC FL2 (Signed)
[place] MEDICAID FL2 LEVEL OF CARE SCREENING TOOL     IDENTIFICATION  Patient Name: Ruben [name] Birthdate: 09-09-1950 Sex: male Admission Date (Current Location): 10/01/2019  [facility] and [place] Number:  Herbalist and Address:  [facility],  [place], [place]      Provider Number: [id number]  Attending Physician Name and Address:  Alma Friendly, MD  Relative Name and Phone Number:       Current Level of Care: Hospital Recommended Level of Care: [place] Prior Approval Number:    Date Approved/Denied:   PASRR Number: [id number]  Discharge Plan: SNF    Current Diagnoses: Patient Active Problem List   Diagnosis Date Noted  . ARF (acute renal failure) ([place]) 10/01/2019  . Alcohol withdrawal ([place]) 10/01/2019  . Normochromic normocytic anemia 10/01/2019  . GERD (gastroesophageal reflux disease) 06/22/2019  . BPH (benign prostatic hyperplasia) 06/22/2019  . Essential hypertension 06/22/2019  . AKI (acute kidney injury) ([place])   . Alcohol intoxication ([place]) 06/21/2019  . S/P Nissen fundoplication (without gastrostomy tube) procedure 04/26/2018    Orientation RESPIRATION BLADDER Height & Weight     Self, Time, Situation, Place  Normal Continent Weight: 78.9 kg Height:  5\' 11"  (180.3 cm)  BEHAVIORAL SYMPTOMS/MOOD NEUROLOGICAL BOWEL NUTRITION STATUS      Continent Diet (Regular)  AMBULATORY STATUS COMMUNICATION OF NEEDS Skin   Limited Assist Verbally                         Personal Care Assistance Level of Assistance  Bathing, Feeding, Dressing Bathing Assistance: Limited assistance Feeding assistance: Independent Dressing Assistance: Limited assistance     Functional Limitations Info  Sight, Hearing, Speech Sight Info: Adequate Hearing Info: Adequate Speech Info: Adequate    SPECIAL CARE FACTORS FREQUENCY                       Contractures Contractures Info: Not present     Additional Factors Info  Code Status, Allergies Code Status Info: FULL Allergies Info: No Known Allergies           Current Medications (10/03/2019):  This is the current hospital active medication list Current Facility-Administered Medications  Medication Dose Route Frequency Provider Last Rate Last Admin  . chlordiazePOXIDE (LIBRIUM) capsule 25 mg  25 mg Oral QID Alma Friendly, MD   25 mg at 10/03/19 1443   Followed by  . [START ON 10/04/2019] chlordiazePOXIDE (LIBRIUM) capsule 25 mg  25 mg Oral TID Alma Friendly, MD       Followed by  . [START ON 10/05/2019] chlordiazePOXIDE (LIBRIUM) capsule 25 mg  25 mg Oral BH-qamhs Alma Friendly, MD       Followed by  . [START ON 10/06/2019] chlordiazePOXIDE (LIBRIUM) capsule 25 mg  25 mg Oral Daily Alma Friendly, MD      . enoxaparin (LOVENOX) injection 40 mg  40 mg Subcutaneous Q24H Alma Friendly, MD      . feeding supplement (ENSURE ENLIVE) (ENSURE ENLIVE) liquid 237 mL  237 mL Oral BID BM Alma Friendly, MD   237 mL at 10/03/19 1443  . hydrALAZINE (APRESOLINE) injection 10 mg  10 mg Intravenous Q4H PRN Rise Patience, MD      . insulin aspart (novoLOG) injection 0-9 Units  0-9 Units Subcutaneous TID WC Alma Friendly, MD   2 Units at 10/03/19 1252  . LORazepam (  ATIVAN) tablet 1-4 mg  1-4 mg Oral Q1H PRN Alma Friendly, MD       Or  . LORazepam (ATIVAN) injection 1-4 mg  1-4 mg Intravenous Q1H PRN Alma Friendly, MD      . metoprolol tartrate (LOPRESSOR) tablet 25 mg  25 mg Oral BID Alma Friendly, MD      . multivitamin with minerals tablet 1 tablet  1 tablet Oral Daily Alma Friendly, MD   1 tablet at 10/03/19 1006  . ondansetron (ZOFRAN) tablet 4 mg  4 mg Oral Q6H PRN Rise Patience, MD       Or  . ondansetron [facility]) injection 4 mg  4 mg Intravenous Q6H PRN Rise Patience, MD   4 mg at 10/03/19 0636  . pantoprazole (PROTONIX) EC tablet 40 mg  40 mg  Oral Daily Alma Friendly, MD   40 mg at 10/03/19 1006  . polyethylene glycol (MIRALAX / GLYCOLAX) packet 17 g  17 g Oral Daily PRN Alma Friendly, MD      . senna-docusate (Senokot-S) tablet 1 tablet  1 tablet Oral BID Alma Friendly, MD   1 tablet at 10/03/19 1253  . thiamine tablet 100 mg  100 mg Oral Daily Leodis Sias T, RPH   100 mg at 10/03/19 1006  . traMADol (ULTRAM) tablet 50 mg  50 mg Oral Q6H PRN Lang Snow, FNP   50 mg at 10/02/19 2252  . vitamin B-12 (CYANOCOBALAMIN) tablet 1,000 mcg  1,000 mcg Oral Daily Alma Friendly, MD   1,000 mcg at 10/03/19 1006     Discharge Medications: Please see discharge summary for a list of discharge medications.  Relevant Imaging Results:  Relevant Lab Results:   Additional Information [phone]  Purcell Mouton, RN

## 2019-10-03 NOTE — Evaluation (Signed)
Occupational Therapy Evaluation Patient Details Name: Ruben [name] MRN: [id number] DOB: [birth year] Today's Date: 10/03/2019    History of Present Illness 69 y.o. male with history of alcoholism who has not had any alcohol for last 2 days since patient was trying to detox himself presents to the ER because he has been having falls frequently last 2 days.  Has not complained of any chest pain or shortness of breath nausea vomiting or diarrhea.   Clinical Impression   Patient with functional deficits listed below impacting safety and independence with self care. Currently limited OOB mobility due to HR up to 160s with standing at EOB. Patient min G due to mild unsteadiness and tremors, no overt loss of balance noted with side stepping to head of bed. Patient returned to bed at end of session with HR back into 110s (patient's resting HR 110 upon arrival). Patient reports "I have a high heart rate all the time, I'm used to it" when asked if feeling any symptoms, denied any pain/dizziness. Will continue to follow.    Follow Up Recommendations  Home health OT;Supervision/Assistance - 24 hour;Other (comment) (pending progress, currently limited by HR)    Equipment Recommendations  Other (comment) (TBD)       Precautions / Restrictions Precautions Precautions: Fall Precaution Comments: monitor HR Restrictions Weight Bearing Restrictions: No      Mobility Bed Mobility Overal bed mobility: Needs Assistance Bed Mobility: Supine to Sit;Sit to Supine     Supine to sit: Min guard;HOB elevated Sit to supine: Min guard;HOB elevated   General bed mobility comments: min guard for safety as patient with mild tremors, use of bed rails  Transfers Overall transfer level: Needs assistance Equipment used: None Transfers: Sit to/from Stand Sit to Stand: Min guard         General transfer comment: min guard due to mild unsteadiness and tremulous, patient able to take shuffled steps towards head  of bed before returned to sitting due to HR. patient asymptomatic     Balance Overall balance assessment: Needs assistance Sitting-balance support: Feet supported Sitting balance-Leahy Scale: Good     Standing balance support: No upper extremity supported;During functional activity Standing balance-Leahy Scale: Fair Standing balance comment: min G for safety as patient is unsteady/ mild tremors                           ADL either performed or assessed with clinical judgement   ADL Overall ADL's : Needs assistance/impaired     Grooming: Set up;Sitting   Upper Body Bathing: Set up;Sitting   Lower Body Bathing: Min guard;Sit to/from stand   Upper Body Dressing : Set up;Sitting   Lower Body Dressing: Min guard;Sit to/from stand   Toilet Transfer: Min guard;Ambulation Toilet Transfer Details (indicate cue type and reason): simulated with functional mobility, limited d/t elevated HR, min guard due to shuffled gait Toileting- Clothing Manipulation and Hygiene: Min guard;Sit to/from stand       Functional mobility during ADLs: Min guard;Cueing for safety General ADL Comments: patient HR up to 160s with standing therefore returned back to bed, RN made aware                  Pertinent Vitals/Pain Pain Assessment: No/denies pain     Hand Dominance Right   Extremity/Trunk Assessment Upper Extremity Assessment Upper Extremity Assessment: Overall WFL for tasks assessed   Lower Extremity Assessment Lower Extremity Assessment: Defer to PT evaluation  Cervical / Trunk Assessment Cervical / Trunk Assessment: Normal   Communication Communication Communication: No difficulties   Cognition Arousal/Alertness: Awake/alert Behavior During Therapy: WFL for tasks assessed/performed Overall Cognitive Status: Within Functional Limits for tasks assessed                                     General Comments  HR in bed 110, EOB 135-138, standing  150-160s. RN made aware            Home Living Family/patient expects to be discharged to:: Private residence Living Arrangements: Alone Available Help at Discharge: Family;Neighbor;Available PRN/intermittently (cousin in [place]) Type of Home: House Home Access: Stairs to enter [employer] of Steps: 2 Entrance Stairs-Rails: None Home Layout: One level     Bathroom Shower/Tub: Tub/shower unit;Walk-in shower         Home Equipment: Walker - 2 wheels          Prior Functioning/Environment Level of Independence: Independent        Comments: Pt reports Ind with ADLs, community ambulator without AD, drives, retired. Pt reports using RW this past Saturday-Monday for unsteadiness due to withdrawals with a couple falls.        OT Problem List: Decreased activity tolerance;Impaired balance (sitting and/or standing);Decreased safety awareness      OT Treatment/Interventions: Self-care/ADL training;Therapeutic exercise;Energy conservation;DME and/or AE instruction;Therapeutic activities;Patient/family education;Balance training    OT Goals(Current goals can be found in the care plan section) Acute Rehab OT Goals Patient Stated Goal: start moving around OT Goal Formulation: With patient Time For Goal Achievement: 10/17/19 Potential to Achieve Goals: Good  OT Frequency: Min 2X/week   Barriers to D/C: Decreased caregiver support  closest family in [place] OT "6 Clicks" Daily Activity     Outcome Measure Help from another person eating meals?: None Help from another person taking care of personal grooming?: A Little Help from another person toileting, which includes using toliet, bedpan, or urinal?: A Little Help from another person bathing (including washing, rinsing, drying)?: A Little Help from another person to put on and taking off regular upper body clothing?: A Little Help from another person to put on and taking off regular lower body  clothing?: A Little 6 Click Score: 19   End of Session Nurse Communication: Mobility status;Other (comment) (HR)  Activity Tolerance: Treatment limited secondary to medical complications (Comment) (HR) Patient left: in bed;with call bell/phone within reach;with chair alarm set  OT Visit Diagnosis: Unsteadiness on feet (R26.81);Other abnormalities of gait and mobility (R26.89)                Time: 6759-1638 OT Time Calculation (min): 19 min Charges:  OT General Charges $OT Visit: 1 Visit OT Evaluation $OT Eval Moderate Complexity: [place] OT Pager: [place] 10/03/2019, 3:34 PM

## 2019-10-03 NOTE — TOC Progression Note (Signed)
Transition of Care [facility]) - Progression Note    Patient Details  Name: Ruben [name] MRN: [id number] Date of Birth: 03/20/1951  [facility]) CM/SW Contact  Purcell Mouton, RN Phone Number: 10/03/2019, 4:44 PM  Clinical Narrative:    Pt declined SNF at present time. FL2 is completed.    Expected Discharge Plan: [place] Barriers to Discharge: No Barriers Identified  Expected Discharge Plan and Services Expected Discharge Plan: [place] arrangements for the past 2 months: [place]                                       Social Determinants of Health (SDOH) Interventions    Readmission Risk Interventions No flowsheet data found.

## 2019-10-03 NOTE — Progress Notes (Signed)
PROGRESS NOTE  Ruben [name] DVV:[id number] DOB: May 21, 1950 DOA: 10/01/2019 PCP: [name], MD  HPI/Recap of past 24 hours: HPI from Dr [name] is a 69 y.o. male with history of alcoholism who has not had any alcohol for last 2 days since patient was trying to detox himself presents to the ER because he has been having falls frequently last 2 days.  Has not complained of any chest pain or shortness of breath nausea vomiting or diarrhea.  Most of the history was obtained from the ER physician since patient is confused at the time of my exam. In the ER patient was found to be diaphoretic and tremulous.  Patient was placed on CIWA protocol, CT head was unremarkable, EKG shows sinus tachycardia.  Labs show creatinine which has increased from 1.17 in May 24 to around 2.7.  BUN is 104.  CBC shows hemoglobin of 12.3 platelets 85.  At the time of my exam patient appears completely confused.  Per ER physician patient was alert and awake when ER physician saw pt. Confusion worsened probably likely 2/2 Ativan.    Today, pt noted to tachycardic, with rising CIWA score needing ativan, reports some nausea. Otherwise no new complaints.    Assessment/Plan: Principal Problem:   ARF (acute renal failure) (HCC) Active Problems:   Essential hypertension   Alcohol withdrawal (HCC)   Normochromic normocytic anemia  Acute metabolic encephalopathy Likely 2/2 alcohol withdrawal/medication induced Currently afebrile, with no leukocytosis Ammonia level 29, mildly elevated LFTs with T bili 1.3 UA unremarkable for infection, UC with insignificant growth Chest x-ray unremarkable Right upper quadrant ultrasound noted fatty liver CT head unremarkable Advance diet as tolerated  Mild rhabdomyolysis Resolved CK level 653 on admission  AKI Resolving Likely 2/2 poor oral intake, in addition to rhabdo S/p IV fluids Daily CMP  Hyponatremia Resolved Likely 2/2 poor oral intake/alcohol  abuse Daily CMP  Thrombocytopenia/vitamin B12 DEF Likely 2/2 alcohol abuse Anemia panel showed iron 39, TIBC 153, sats 26, ferritin 950, folate 10.4, vitamin B12 265 Continue vitamin B12 supplements Daily CBC  Alcohol abuse/withdrawal Noted tachycardia, rising CIWA score Continue IV fluids, thiamine, B12 supplementation CIWA protocol, Librium detox regimen Advised to quit   Hypertension BP stable Monitor  Frequent falls Likely 2/2 alcohol abuse/neuropathy CT head unremarkable X-ray pelvis unremarkable PT/OT Fall precautions       Malnutrition Type:  Nutrition Problem: Increased nutrient needs Etiology:  (substance abuse, ETOH)   Malnutrition Characteristics:  Signs/Symptoms: estimated needs   Nutrition Interventions:  Interventions: Ensure Enlive (each supplement provides 350kcal and 20 grams of protein), MVI    Estimated body mass index is 24.26 kg/m as calculated from the following:   Height as of this encounter: 5\' 11"  (1.803 m).   Weight as of this encounter: 78.9 kg.     Code Status: Full  Family Communication: Plan to discuss with family, none at bedside  Disposition Plan: Status is: Inpatient  Remains inpatient appropriate because:Inpatient level of care appropriate due to severity of illness   Dispo: The patient is from: Home              Anticipated d/c is to: SNF              Anticipated d/c date is: 2 days              Patient currently is not medically stable to d/c.    Consultants:  None  Procedures:  None  Antimicrobials:  None  DVT prophylaxis: Lovenox   Objective: Vitals:   10/02/19 2124 10/03/19 0616 10/03/19 1246 10/03/19 1257  BP: (!) 149/86 (!) 148/87 118/80   Pulse: 61 67 (!) 125 94  Resp: 18 18 19    Temp: 98.1 F (36.7 C) 98 F (36.7 C) 97.9 F (36.6 C)   TempSrc: Oral  Oral   SpO2: 99% 99% 97%   Weight:      Height:        Intake/Output Summary (Last 24 hours) at 10/03/2019 1532 Last data filed  at 10/03/2019 0900 Gross per 24 hour  Intake 791.41 ml  Output 1300 ml  Net -508.59 ml   Filed Weights   10/02/19 0041  Weight: 78.9 kg    Exam:  General: NAD, oriented   Cardiovascular: S1, S2 present  Respiratory: CTAB  Abdomen: Soft, nontender, nondistended, bowel sounds present  Musculoskeletal: No bilateral pedal edema noted  Skin: Normal  Psychiatry:  Normal mood  Neurology: Strength equal in all extremities, no obvious focal neurologic deficits noted   Data Reviewed: CBC: Recent Labs  Lab 10/01/19 0334 10/01/19 0758 10/02/19 0429 10/03/19 0409  WBC 6.6 3.8* 5.2 6.6  NEUTROABS 5.1 2.8 3.7 4.4  HGB 12.3* 8.8* 12.2* 12.1*  HCT 36.1* 25.8* 35.6* 36.8*  MCV 97.6 98.1 97.5 98.9  PLT 85* 65* 102* 390*   Basic Metabolic Panel: Recent Labs  Lab 10/01/19 0334 10/01/19 0758 10/02/19 0429 10/03/19 0409  NA 131* 132* 133* 135  K 3.9 3.5 3.3* 3.5  CL 92* 95* 98 101  CO2 22 22 24 26   GLUCOSE 153* 135* 174* 125*  BUN 104* 89* 56* 25*  CREATININE 2.76* 2.19* 1.41* 1.11  CALCIUM 8.2* 7.6* 8.3* 7.9*  MG 2.7*  --   --   --    GFR: Estimated Creatinine Clearance: 68.8 mL/min (by C-G formula based on SCr of 1.11 mg/dL). Liver Function Tests: Recent Labs  Lab 10/01/19 0334 10/01/19 0758 10/02/19 0429 10/03/19 0409  AST 63* 52* 46* 33  ALT 64* 56* 51* 41  ALKPHOS 107 91 89 81  BILITOT 1.6* 1.3* 1.3* 0.9  PROT 6.7 5.8* 5.9* 5.7*  ALBUMIN 3.7 3.2* 3.2* 2.9*   No results for input(s): LIPASE, AMYLASE in the last 168 hours. Recent Labs  Lab 10/01/19 0823  AMMONIA 29   Coagulation Profile: No results for input(s): INR, PROTIME in the last 168 hours. Cardiac Enzymes: Recent Labs  Lab 10/01/19 0758 10/02/19 0429  CKTOTAL 653* 290   BNP (last 3 results) No results for input(s): PROBNP in the last 8760 hours. HbA1C: Recent Labs    10/03/19 0409  HGBA1C 5.7*   CBG: Recent Labs  Lab 10/02/19 1751 10/02/19 2122 10/03/19 0239 10/03/19 0752  10/03/19 1115  GLUCAP 190* 132* 99 110* 174*   Lipid Profile: No results for input(s): CHOL, HDL, LDLCALC, TRIG, CHOLHDL, LDLDIRECT in the last 72 hours. Thyroid Function Tests: Recent Labs    10/01/19 0758  TSH 1.944   Anemia Panel: Recent Labs    10/01/19 0758  VITAMINB12 265  FOLATE 10.4  FERRITIN 950*  TIBC 153*  IRON 39*  RETICCTPCT 0.5   Urine analysis:    Component Value Date/Time   COLORURINE YELLOW 10/01/2019 1146   APPEARANCEUR TURBID (A) 10/01/2019 1146   LABSPEC 1.014 10/01/2019 1146   PHURINE 5.0 10/01/2019 1146   GLUCOSEU 50 (A) 10/01/2019 1146   HGBUR SMALL (A) 10/01/2019 1146   BILIRUBINUR NEGATIVE 10/01/2019 1146   KETONESUR 5 (A) 10/01/2019 1146  PROTEINUR NEGATIVE 10/01/2019 1146   NITRITE NEGATIVE 10/01/2019 [place] 10/01/2019 1146   Sepsis Labs: @LABRCNTIP (procalcitonin:4,lacticidven:4)  ) Recent Results (from the past 240 hour(s))  SARS Coronavirus 2 by RT PCR (hospital order, performed in [facility] hospital lab) Nasopharyngeal Nasopharyngeal Swab     Status: None   Collection Time: 10/01/19  4:59 AM   Specimen: Nasopharyngeal Swab  Result Value Ref Range Status   SARS Coronavirus 2 NEGATIVE NEGATIVE Final    Comment: (NOTE) SARS-CoV-2 target nucleic acids are NOT DETECTED.  The SARS-CoV-2 RNA is generally detectable in upper and lower respiratory specimens during the acute phase of infection. The lowest concentration of SARS-CoV-2 viral copies this assay can detect is 250 copies / mL. A negative result does not preclude SARS-CoV-2 infection and should not be used as the sole basis for treatment or other patient management decisions.  A negative result may occur with improper specimen collection / handling, submission of specimen other than nasopharyngeal swab, presence of viral mutation(s) within the areas targeted by this assay, and inadequate number of viral copies (<250 copies / mL). A negative result must  be combined with clinical observations, patient history, and epidemiological information.  Fact Sheet for Patients:   StrictlyIdeas.no  Fact Sheet for Healthcare Providers: BankingDealers.co.za  This test is not yet approved or  cleared by the [place] FDA and has been authorized for detection and/or diagnosis of SARS-CoV-2 by FDA under an Emergency Use Authorization (EUA).  This EUA will remain in effect (meaning this test can be used) for the duration of the COVID-19 declaration under Section 564(b)(1) of the Act, 21 U.S.C. section 360bbb-3(b)(1), unless the authorization is terminated or revoked sooner.  Performed at [facility], [place] [address], [place], [place] [address]   Urine Culture     Status: Abnormal   Collection Time: 10/01/19 11:46 AM   Specimen: Urine, Random  Result Value Ref Range Status   Specimen Description   Final    URINE, RANDOM Performed at [place] [address] [place], [place] [address]    Special Requests   Final    NONE Performed at [facility], [place] [address], [place], [place] [address]    Culture (A)  Final    <10,000 COLONIES/mL INSIGNIFICANT GROWTH Performed at [facility] [address] [place],  [address]    Report Status 10/03/2019 FINAL  Final      Studies: No results found.  Scheduled Meds: . chlordiazePOXIDE  25 mg Oral QID   Followed by  . [START ON 10/04/2019] chlordiazePOXIDE  25 mg Oral TID   Followed by  . [START ON 10/05/2019] chlordiazePOXIDE  25 mg Oral BH-qamhs   Followed by  . [START ON 10/06/2019] chlordiazePOXIDE  25 mg Oral Daily  . feeding supplement (ENSURE ENLIVE)  237 mL Oral BID BM  . insulin aspart  0-9 Units Subcutaneous TID WC  . multivitamin with minerals  1 tablet Oral Daily  . pantoprazole  40 mg Oral Daily  . senna-docusate  1 tablet Oral BID  . thiamine  100 mg Oral  Daily  . vitamin B-12  1,000 mcg Oral Daily    Continuous Infusions:    LOS: 2 days     Alma Friendly, MD [facility]  If 7PM-7AM, please contact night-coverage [email or web address] 10/03/2019, 3:32 PM

## 2019-10-04 LAB — COMPREHENSIVE METABOLIC PANEL
ALT: 33 U/L (ref 0–44)
AST: 26 U/L (ref 15–41)
Albumin: 2.5 g/dL — ABNORMAL LOW (ref 3.5–5.0)
Alkaline Phosphatase: 67 U/L (ref 38–126)
Anion gap: 6 (ref 5–15)
BUN: 19 mg/dL (ref 8–23)
CO2: 28 mmol/L (ref 22–32)
Calcium: 7.7 mg/dL — ABNORMAL LOW (ref 8.9–10.3)
Chloride: 97 mmol/L — ABNORMAL LOW (ref 98–111)
Creatinine, Ser: 1.22 mg/dL (ref 0.61–1.24)
GFR calc Af Amer: >60 mL/min (ref >60)
GFR calc non Af Amer: >60 mL/min (ref >60)
Glucose, Bld: 155 mg/dL — ABNORMAL HIGH (ref 70–99)
Potassium: 3.2 mmol/L — ABNORMAL LOW (ref 3.5–5.1)
Sodium: 131 mmol/L — ABNORMAL LOW (ref 135–145)
Total Bilirubin: 0.6 mg/dL (ref 0.3–1.2)
Total Protein: 5.2 g/dL — ABNORMAL LOW (ref 6.5–8.1)

## 2019-10-04 LAB — GLUCOSE, CAPILLARY
Glucose-Capillary: 123 mg/dL — ABNORMAL HIGH (ref 70–99)
Glucose-Capillary: 102 mg/dL — ABNORMAL HIGH (ref 70–99)
Glucose-Capillary: 145 mg/dL — ABNORMAL HIGH (ref 70–99)
Glucose-Capillary: 140 mg/dL — ABNORMAL HIGH (ref 70–99)

## 2019-10-04 LAB — CBC WITH DIFFERENTIAL/PLATELET
Abs Immature Granulocytes: 0.06 10*3/uL (ref 0.00–0.07)
Basophils Absolute: 0 10*3/uL (ref 0.0–0.1)
Basophils Relative: 1 %
Eosinophils Absolute: 0.2 10*3/uL (ref 0.0–0.5)
Eosinophils Relative: 3 %
HCT: 33.2 % — ABNORMAL LOW (ref 39.0–52.0)
Hemoglobin: 11.1 g/dL — ABNORMAL LOW (ref 13.0–17.0)
Immature Granulocytes: 1 %
Lymphocytes Relative: 16 %
Lymphs Abs: 0.9 10*3/uL (ref 0.7–4.0)
MCH: 32.6 pg (ref 26.0–34.0)
MCHC: 33.4 g/dL (ref 30.0–36.0)
MCV: 97.4 fL (ref 80.0–100.0)
Monocytes Absolute: 1.1 10*3/uL — ABNORMAL HIGH (ref 0.1–1.0)
Monocytes Relative: 19 %
Neutro Abs: 3.5 10*3/uL (ref 1.7–7.7)
Neutrophils Relative %: 60 %
Platelets: 166 10*3/uL (ref 150–400)
RBC: 3.41 MIL/uL — ABNORMAL LOW (ref 4.22–5.81)
RDW: 12.5 % (ref 11.5–15.5)
WBC: 5.8 10*3/uL (ref 4.0–10.5)
nRBC: 0 % (ref 0.0–0.2)

## 2019-10-04 LAB — MAGNESIUM: Magnesium: 1.3 mg/dL — ABNORMAL LOW (ref 1.7–2.4)

## 2019-10-04 MED ORDER — MAGNESIUM SULFATE 4 GM/100ML IV SOLN
4.0000 g | Freq: Once | INTRAVENOUS | Status: AC
  Administered 2019-10-04: 4 g via INTRAVENOUS
  Filled 2019-10-04: qty 100

## 2019-10-04 MED ORDER — MAGNESIUM CITRATE PO SOLN
1.0000 | Freq: Once | ORAL | Status: AC
  Administered 2019-10-04: 1 via ORAL
  Filled 2019-10-04: qty 296

## 2019-10-04 MED ORDER — POTASSIUM CHLORIDE CRYS ER 20 MEQ PO TBCR
40.0000 meq | EXTENDED_RELEASE_TABLET | Freq: Once | ORAL | Status: AC
  Administered 2019-10-04: 40 meq via ORAL
  Filled 2019-10-04: qty 2

## 2019-10-04 NOTE — Progress Notes (Signed)
PT Note  Patient Details Name: Ruben [name] MRN: [id number] DOB: [birth year]      Checked on pt today for session with PT, pt refused stating he was drinking a medicine for his constipation and wanted to wait.   Will check back as time allows.    Clide Dales 10/04/2019, 6:24 PM  Gatha Mayer, PT, MPT Acute Rehabilitation Services Office: [phone] Pager: [phone] 10/04/2019

## 2019-10-04 NOTE — Progress Notes (Signed)
PROGRESS NOTE  Ruben [name] OMV:[id number] DOB: [birth year] DOA: 10/01/2019 PCP: [name], MD  HPI/Recap of past 24 hours: HPI from Dr [name] is a 69 y.o. male with history of alcoholism who has not had any alcohol for last 2 days since patient was trying to detox himself presents to the ER because he has been having falls frequently last 2 days.  Has not complained of any chest pain or shortness of breath nausea vomiting or diarrhea.  Most of the history was obtained from the ER physician since patient is confused at the time of my exam. In the ER patient was found to be diaphoretic and tremulous.  Patient was placed on CIWA protocol, CT head was unremarkable, EKG shows sinus tachycardia.  Labs show creatinine which has increased from 1.17 in May 24 to around 2.7.  BUN is 104.  CBC shows hemoglobin of 12.3 platelets 85.  At the time of my exam patient appears completely confused.  Per ER physician patient was alert and awake when ER physician saw Ruben [name]. Confusion worsened probably likely 2/2 Ativan.    Today, patient denies any new complaints, still having trouble ambulating. Awaiting another evaluation from Ruben [name]/OT for disposition.    Assessment/Plan: Principal Problem:   ARF (acute renal failure) (HCC) Active Problems:   Essential hypertension   Alcohol withdrawal (HCC)   Normochromic normocytic anemia  Acute metabolic encephalopathy Likely 2/2 alcohol withdrawal/medication induced Currently afebrile, with no leukocytosis Ammonia level 29, mildly elevated LFTs with T bili 1.3 UA unremarkable for infection, UC with insignificant growth Chest x-ray unremarkable Right upper quadrant ultrasound noted fatty liver CT head unremarkable Advance diet as tolerated  Mild rhabdomyolysis Resolved CK level 653 on admission  AKI Resolving Likely 2/2 poor oral intake, in addition to rhabdo S/p IV fluids Daily CMP  Hyponatremia/hypokalemia/hypomagnesemia Replace as  needed Daily BMP  Thrombocytopenia/vitamin B12 DEF Likely 2/2 alcohol abuse Anemia panel showed iron 39, TIBC 153, sats 26, ferritin 950, folate 10.4, vitamin B12 265 Continue vitamin B12 supplements Daily CBC  Alcohol abuse/withdrawal Continue thiamine, B12 supplementation CIWA protocol, Librium detox regimen Advised to quit   Hypertension BP stable Monitor  Frequent falls Likely 2/2 alcohol abuse/neuropathy CT head unremarkable X-ray pelvis unremarkable Ruben [name]/OT Fall precautions       Malnutrition Type:  Nutrition Problem: Increased nutrient needs Etiology:  (substance abuse, ETOH)   Malnutrition Characteristics:  Signs/Symptoms: estimated needs   Nutrition Interventions:  Interventions: Ensure Enlive (each supplement provides 350kcal and 20 grams of protein), MVI    Estimated body mass index is 24.26 kg/m as calculated from the following:   Height as of this encounter: 5\' 11"  (1.803 m).   Weight as of this encounter: 78.9 kg.     Code Status: Full  Family Communication: Discussed extensively with patient, patient has no children or wife  Disposition Plan: Status is: Inpatient  Remains inpatient appropriate because:Inpatient level of care appropriate due to severity of illness   Dispo: The patient is from: Home              Anticipated d/c is to: SNF              Anticipated d/c date is: 2 days              Patient currently is not medically stable to d/c.    Consultants:  None  Procedures:  None  Antimicrobials:  None  DVT prophylaxis: Lovenox   Objective: Vitals:  10/03/19 1257 10/03/19 2140 10/04/19 0631 10/04/19 1240  BP:  121/88 127/88 113/80  Pulse: 94 84 79 88  Resp:  14 14 18   Temp:  97.8 F (36.6 C) 98.2 F (36.8 C) 98.5 F (36.9 C)  TempSrc:  Oral Oral Oral  SpO2:  99% 98% 97%  Weight:      Height:        Intake/Output Summary (Last 24 hours) at 10/04/2019 1623 Last data filed at 10/04/2019 1159 Gross per 24  hour  Intake --  Output 1500 ml  Net -1500 ml   Filed Weights   10/02/19 0041  Weight: 78.9 kg    Exam:  General: NAD, oriented   Cardiovascular: S1, S2 present  Respiratory: CTAB  Abdomen: Soft, nontender, nondistended, bowel sounds present  Musculoskeletal: No bilateral pedal edema noted  Skin: Normal  Psychiatry:  Normal mood  Neurology: Strength equal in all extremities, no obvious focal neurologic deficits noted   Data Reviewed: CBC: Recent Labs  Lab 10/01/19 0334 10/01/19 0758 10/02/19 0429 10/03/19 0409 10/04/19 0431  WBC 6.6 3.8* 5.2 6.6 5.8  NEUTROABS 5.1 2.8 3.7 4.4 3.5  HGB 12.3* 8.8* 12.2* 12.1* 11.1*  HCT 36.1* 25.8* 35.6* 36.8* 33.2*  MCV 97.6 98.1 97.5 98.9 97.4  PLT 85* 65* 102* 140* 702   Basic Metabolic Panel: Recent Labs  Lab 10/01/19 0334 10/01/19 0758 10/02/19 0429 10/03/19 0409 10/04/19 0431 10/04/19 0807  NA 131* 132* 133* 135 131*  --   K 3.9 3.5 3.3* 3.5 3.2*  --   CL 92* 95* 98 101 97*  --   CO2 22 22 24 26 28   --   GLUCOSE 153* 135* 174* 125* 155*  --   BUN 104* 89* 56* 25* 19  --   CREATININE 2.76* 2.19* 1.41* 1.11 1.22  --   CALCIUM 8.2* 7.6* 8.3* 7.9* 7.7*  --   MG 2.7*  --   --   --   --  1.3*   GFR: Estimated Creatinine Clearance: 62.6 mL/min (by C-G formula based on SCr of 1.22 mg/dL). Liver Function Tests: Recent Labs  Lab 10/01/19 0334 10/01/19 0758 10/02/19 0429 10/03/19 0409 10/04/19 0431  AST 63* 52* 46* 33 26  ALT 64* 56* 51* 41 33  ALKPHOS 107 91 89 81 67  BILITOT 1.6* 1.3* 1.3* 0.9 0.6  PROT 6.7 5.8* 5.9* 5.7* 5.2*  ALBUMIN 3.7 3.2* 3.2* 2.9* 2.5*   No results for input(s): LIPASE, AMYLASE in the last 168 hours. Recent Labs  Lab 10/01/19 0823  AMMONIA 29   Coagulation Profile: No results for input(s): INR, PROTIME in the last 168 hours. Cardiac Enzymes: Recent Labs  Lab 10/01/19 0758 10/02/19 0429  CKTOTAL 653* 290   BNP (last 3 results) No results for input(s): PROBNP in the last  8760 hours. HbA1C: Recent Labs    10/03/19 0409  HGBA1C 5.7*   CBG: Recent Labs  Lab 10/03/19 1705 10/03/19 2132 10/04/19 0745 10/04/19 1105 10/04/19 1612  GLUCAP 156* 108* 123* 102* 145*   Lipid Profile: No results for input(s): CHOL, HDL, LDLCALC, TRIG, CHOLHDL, LDLDIRECT in the last 72 hours. Thyroid Function Tests: No results for input(s): TSH, T4TOTAL, FREET4, T3FREE, THYROIDAB in the last 72 hours. Anemia Panel: No results for input(s): VITAMINB12, FOLATE, FERRITIN, TIBC, IRON, RETICCTPCT in the last 72 hours. Urine analysis:    Component Value Date/Time   COLORURINE YELLOW 10/01/2019 1146   APPEARANCEUR TURBID (A) 10/01/2019 1146   LABSPEC 1.014 10/01/2019 1146  PHURINE 5.0 10/01/2019 1146   GLUCOSEU 50 (A) 10/01/2019 1146   HGBUR SMALL (A) 10/01/2019 1146   BILIRUBINUR NEGATIVE 10/01/2019 1146   KETONESUR 5 (A) 10/01/2019 1146   PROTEINUR NEGATIVE 10/01/2019 1146   NITRITE NEGATIVE 10/01/2019 1146   LEUKOCYTESUR NEGATIVE 10/01/2019 1146   Sepsis Labs: @LABRCNTIP (procalcitonin:4,lacticidven:4)  ) Recent Results (from the past 240 hour(s))  SARS Coronavirus 2 by RT PCR (hospital order, performed in [facility] hospital lab) Nasopharyngeal Nasopharyngeal Swab     Status: None   Collection Time: 10/01/19  4:59 AM   Specimen: Nasopharyngeal Swab  Result Value Ref Range Status   SARS Coronavirus 2 NEGATIVE NEGATIVE Final    Comment: (NOTE) SARS-CoV-2 target nucleic acids are NOT DETECTED.  The SARS-CoV-2 RNA is generally detectable in upper and lower respiratory specimens during the acute phase of infection. The lowest concentration of SARS-CoV-2 viral copies this assay can detect is 250 copies / mL. A negative result does not preclude SARS-CoV-2 infection and should not be used as the sole basis for treatment or other patient management decisions.  A negative result may occur with improper specimen collection / handling, submission of specimen other than  nasopharyngeal swab, presence of viral mutation(s) within the areas targeted by this assay, and inadequate number of viral copies (<250 copies / mL). A negative result must be combined with clinical observations, patient history, and epidemiological information.  Fact Sheet for Patients:   StrictlyIdeas.no  Fact Sheet for Healthcare Providers: BankingDealers.co.za  This test is not yet approved or  cleared by the [place] FDA and has been authorized for detection and/or diagnosis of SARS-CoV-2 by FDA under an Emergency Use Authorization (EUA).  This EUA will remain in effect (meaning this test can be used) for the duration of the COVID-19 declaration under Section 564(b)(1) of the Act, 21 U.S.C. section 360bbb-3(b)(1), unless the authorization is terminated or revoked sooner.  Performed at [facility], [place] [address] [place], [place] [address]   Urine Culture     Status: Abnormal   Collection Time: 10/01/19 11:46 AM   Specimen: Urine, Random  Result Value Ref Range Status   Specimen Description   Final    URINE, RANDOM Performed at [facility] [address] [place], [place] [address]    Special Requests   Final    NONE Performed at [facility], [place] [address] [place], [place] [address]    Culture (A)  Final    <10,000 COLONIES/mL INSIGNIFICANT GROWTH Performed at [place] [address] [place], [place] [address]    Report Status 10/03/2019 FINAL  Final      Studies: No results found.  Scheduled Meds: . chlordiazePOXIDE  25 mg Oral TID   Followed by  . [START ON 10/05/2019] chlordiazePOXIDE  25 mg Oral BH-qamhs   Followed by  . [START ON 10/06/2019] chlordiazePOXIDE  25 mg Oral Daily  . enoxaparin (LOVENOX) injection  40 mg Subcutaneous Q24H  . feeding supplement (ENSURE ENLIVE)  237 mL Oral BID BM  . insulin aspart  0-9 Units Subcutaneous  TID WC  . metoprolol tartrate  25 mg Oral BID  . multivitamin with minerals  1 tablet Oral Daily  . pantoprazole  40 mg Oral Daily  . senna-docusate  1 tablet Oral BID  . thiamine  100 mg Oral Daily  . vitamin B-12  1,000 mcg Oral Daily    Continuous Infusions:    LOS: 3 days  Alma Friendly, MD [facility]  If 7PM-7AM, please contact night-coverage [email or web address] 10/04/2019, 4:23 PM

## 2019-10-05 LAB — BASIC METABOLIC PANEL WITH GFR
Anion gap: 11 (ref 5–15)
BUN: 16 mg/dL (ref 8–23)
CO2: 28 mmol/L (ref 22–32)
Calcium: 8.2 mg/dL — ABNORMAL LOW (ref 8.9–10.3)
Chloride: 97 mmol/L — ABNORMAL LOW (ref 98–111)
Creatinine, Ser: 1.12 mg/dL (ref 0.61–1.24)
GFR calc Af Amer: >60 mL/min
GFR calc non Af Amer: >60 mL/min
Glucose, Bld: 101 mg/dL — ABNORMAL HIGH (ref 70–99)
Potassium: 3.8 mmol/L (ref 3.5–5.1)
Sodium: 136 mmol/L (ref 135–145)

## 2019-10-05 LAB — GLUCOSE, CAPILLARY
Glucose-Capillary: 137 mg/dL — ABNORMAL HIGH (ref 70–99)
Glucose-Capillary: 134 mg/dL — ABNORMAL HIGH (ref 70–99)
Glucose-Capillary: 122 mg/dL — ABNORMAL HIGH (ref 70–99)
Glucose-Capillary: 133 mg/dL — ABNORMAL HIGH (ref 70–99)

## 2019-10-05 LAB — VITAMIN B1: Vitamin B1 (Thiamine): 252.8 nmol/L — ABNORMAL HIGH (ref 66.5–200.0)

## 2019-10-05 LAB — MAGNESIUM: Magnesium: 2.2 mg/dL (ref 1.7–2.4)

## 2019-10-05 NOTE — Plan of Care (Signed)
Discussed with patient and cousin over the phone plan of care for the evening, pain management and bedtime medications with some teach back displayed.  Cousin wants to speak to the MD since they want him to go to a SNF rehab instead of home.  They are planning on taking him to ETOH rehab right after his PT in a SNF.

## 2019-10-05 NOTE — Progress Notes (Signed)
Physical Therapy Treatment Patient Details Name: Ruben [name] MRN: [id number] DOB: 1951-02-15 Today's Date: 10/05/2019    History of Present Illness 69 y.o. male with history of alcoholism who has not had any alcohol for  2 days prior to admission; patient was trying to detox himself presents to the ER d/t falls; admitted with ARF, placed on CIWA protocol    PT Comments    Pt much improved overall. amb ~380' with RW and min/guard to supervision, mild unsteadiness, improved with distance, no overt LOB. amb Short distance  In room with min/guard and no device. Pt has RW Will benefit from HHPT/HHOT at d/c    Follow Up Recommendations  Home health PT;Supervision - Intermittent (& HHOT)     Equipment Recommendations  Other (comment) (has RW )    Recommendations for Other Services       Precautions / Restrictions Precautions Precautions: Fall Precaution Comments: monitor HR Restrictions Weight Bearing Restrictions: No    Mobility  Bed Mobility Overal bed mobility: Needs Assistance Bed Mobility: Supine to Sit;Sit to Supine     Supine to sit: Supervision Sit to supine: Supervision   General bed mobility comments: for safety,incr time, no physical assist   Transfers Overall transfer level: Needs assistance Equipment used: Rolling walker (2 wheeled) Transfers: Sit to/from Stand Sit to Stand: Supervision;Min guard         General transfer comment: cues for hand placement, mild unsteadiness on initial standing  Ambulation/Gait Ambulation/Gait assistance: Min guard;Supervision Gait Distance (Feet): 380 Feet Assistive device: Rolling walker (2 wheeled) Gait Pattern/deviations: Step-through pattern;Decreased stride length;Wide base of support     General Gait Details: occasional cues for RW position; HR max  (briefly) 135, 101 prior to amb and majority of distance HR 122-126   Stairs             Wheelchair Mobility    Modified Rankin (Stroke Patients  Only)       Balance Overall balance assessment: Needs assistance Sitting-balance support: Feet supported Sitting balance-Leahy Scale: Good     Standing balance support: No upper extremity supported Standing balance-Leahy Scale: Fair Standing balance comment: min/guard for dynamic standing, able to shift out of BOS ~ 6"                             Cognition Arousal/Alertness: Awake/alert Behavior During Therapy: WFL for tasks assessed/performed Overall Cognitive Status: Within Functional Limits for tasks assessed                                 General Comments: pleasant and cooperative       Exercises      General Comments        Pertinent Vitals/Pain Pain Assessment: No/denies pain    Home Living                      Prior Function            PT Goals (current goals can now be found in the care plan section) Acute Rehab PT Goals Patient Stated Goal: start moving around PT Goal Formulation: With patient Time For Goal Achievement: 10/16/19 Potential to Achieve Goals: Good Progress towards PT goals: Progressing toward goals    Frequency    Min 3X/week      PT Plan Discharge plan needs to be updated    Co-evaluation  AM-PAC PT "6 Clicks" Mobility   Outcome Measure  Help needed turning from your back to your side while in a flat bed without using bedrails?: None Help needed moving from lying on your back to sitting on the side of a flat bed without using bedrails?: None Help needed moving to and from a bed to a chair (including a wheelchair)?: A Little Help needed standing up from a chair using your arms (e.g., wheelchair or bedside chair)?: A Little Help needed to walk in hospital room?: A Little Help needed climbing 3-5 steps with a railing? : A Little 6 Click Score: 20    End of Session   Activity Tolerance: Patient tolerated treatment well Patient left: in bed;with call bell/phone within  reach;with bed alarm set Nurse Communication: Mobility status;Other (comment) (VS) PT Visit Diagnosis: Other abnormalities of gait and mobility (R26.89);Repeated falls (R29.6);Muscle weakness (generalized) (M62.81)     Time: 7944-4619 PT Time Calculation (min) (ACUTE ONLY): 20 min  Charges:  $Gait Training: 8-22 mins                     Baxter Flattery, PT  Acute Rehab Dept ([place]) [phone] Pager [phone]  10/05/2019    [facility] 10/05/2019, 2:16 PM

## 2019-10-05 NOTE — Progress Notes (Signed)
PROGRESS NOTE  Ruben [name] JJK:[id number] DOB: 08/13/1950 DOA: 10/01/2019 PCP: [name], MD  HPI/Recap of past 24 hours: HPI from Dr [name] is a 69 y.o. male with history of alcoholism who has not had any alcohol for last 2 days since patient was trying to detox himself presents to the ER because he has been having falls frequently last 2 days.  Has not complained of any chest pain or shortness of breath nausea vomiting or diarrhea.  Most of the history was obtained from the ER physician since patient is confused at the time of my exam. In the ER patient was found to be diaphoretic and tremulous.  Patient was placed on CIWA protocol, CT head was unremarkable, EKG shows sinus tachycardia.  Labs show creatinine which has increased from 1.17 in May 24 to around 2.7.  BUN is 104.  CBC shows hemoglobin of 12.3 platelets 85.  At the time of my exam patient appears completely confused.  Per ER physician patient was alert and awake when ER physician saw pt. Confusion worsened probably likely 2/2 Ativan.    Today, patient still feels unsteady, heart rate went up to 160 nonsustained while having a BM, denies any worsening shortness of breath, chest pain, abdominal pain, nausea/vomiting, fever/chills.    Assessment/Plan: Principal Problem:   ARF (acute renal failure) (HCC) Active Problems:   Essential hypertension   Alcohol withdrawal (HCC)   Normochromic normocytic anemia  Acute metabolic encephalopathy Likely 2/2 alcohol withdrawal/medication induced Currently afebrile, with no leukocytosis Ammonia level 29, mildly elevated LFTs with T bili 1.3 UA unremarkable for infection, UC with insignificant growth Chest x-ray unremarkable Right upper quadrant ultrasound noted fatty liver CT head unremarkable Advance diet as tolerated  Mild rhabdomyolysis Resolved CK level 653 on admission  AKI Resolved Likely 2/2 poor oral intake, in addition to rhabdo S/p IV  fluids Daily CMP  Hyponatremia/hypokalemia/hypomagnesemia Replace as needed Daily BMP  Thrombocytopenia/vitamin B12 DEF Likely 2/2 alcohol abuse Anemia panel showed iron 39, TIBC 153, sats 26, ferritin 950, folate 10.4, vitamin B12 265 Continue vitamin B12 supplements Daily CBC  Alcohol abuse/withdrawal Continue thiamine, B12 supplementation CIWA protocol, Librium detox regimen Advised to quit   Hypertension BP stable Monitor  Frequent falls Likely 2/2 alcohol abuse/neuropathy CT head unremarkable X-ray pelvis unremarkable PT/OT Fall precautions       Malnutrition Type:  Nutrition Problem: Increased nutrient needs Etiology:  (substance abuse, ETOH)   Malnutrition Characteristics:  Signs/Symptoms: estimated needs   Nutrition Interventions:  Interventions: Ensure Enlive (each supplement provides 350kcal and 20 grams of protein), MVI    Estimated body mass index is 24.26 kg/m as calculated from the following:   Height as of this encounter: 5\' 11"  (1.803 m).   Weight as of this encounter: 78.9 kg.     Code Status: Full  Family Communication: Discussed extensively with patient, patient has no children or wife  Disposition Plan: Status is: Inpatient  Remains inpatient appropriate because:Inpatient level of care appropriate due to severity of illness   Dispo: The patient is from: Home              Anticipated d/c is to: Home              Anticipated d/c date is: 1 day              Patient currently is not medically stable to d/c.    Consultants:  None  Procedures:  None  Antimicrobials:  None  DVT prophylaxis: Lovenox   Objective: Vitals:   10/04/19 1240 10/04/19 1932 10/05/19 0503 10/05/19 1340  BP: 113/80 118/76 137/65 121/78  Pulse: 88 89 71 77  Resp: 18 14 16 17   Temp: 98.5 F (36.9 C) 98.2 F (36.8 C) 97.9 F (36.6 C) 98.2 F (36.8 C)  TempSrc: Oral Oral Oral Oral  SpO2: 97% 97% 100% 98%  Weight:      Height:         Intake/Output Summary (Last 24 hours) at 10/05/2019 1445 Last data filed at 10/04/2019 1805 Gross per 24 hour  Intake 19.97 ml  Output 300 ml  Net -280.03 ml   Filed Weights   10/02/19 0041  Weight: 78.9 kg    Exam:  General: NAD, oriented   Cardiovascular: S1, S2 present  Respiratory: CTAB  Abdomen: Soft, nontender, nondistended, bowel sounds present  Musculoskeletal: No bilateral pedal edema noted  Skin: Normal  Psychiatry:  Normal mood  Neurology: Strength equal in all extremities, no obvious focal neurologic deficits noted   Data Reviewed: CBC: Recent Labs  Lab 10/01/19 0334 10/01/19 0758 10/02/19 0429 10/03/19 0409 10/04/19 0431  WBC 6.6 3.8* 5.2 6.6 5.8  NEUTROABS 5.1 2.8 3.7 4.4 3.5  HGB 12.3* 8.8* 12.2* 12.1* 11.1*  HCT 36.1* 25.8* 35.6* 36.8* 33.2*  MCV 97.6 98.1 97.5 98.9 97.4  PLT 85* 65* 102* 140* 627   Basic Metabolic Panel: Recent Labs  Lab 10/01/19 0334 10/01/19 0334 10/01/19 0758 10/02/19 0429 10/03/19 0409 10/04/19 0431 10/04/19 0807 10/05/19 0455  NA 131*   < > 132* 133* 135 131*  --  136  K 3.9   < > 3.5 3.3* 3.5 3.2*  --  3.8  CL 92*   < > 95* 98 101 97*  --  97*  CO2 22   < > 22 24 26 28   --  28  GLUCOSE 153*   < > 135* 174* 125* 155*  --  101*  BUN 104*   < > 89* 56* 25* 19  --  16  CREATININE 2.76*   < > 2.19* 1.41* 1.11 1.22  --  1.12  CALCIUM 8.2*   < > 7.6* 8.3* 7.9* 7.7*  --  8.2*  MG 2.7*  --   --   --   --   --  1.3* 2.2   < > = values in this interval not displayed.   GFR: Estimated Creatinine Clearance: 68.2 mL/min (by C-G formula based on SCr of 1.12 mg/dL). Liver Function Tests: Recent Labs  Lab 10/01/19 0334 10/01/19 0758 10/02/19 0429 10/03/19 0409 10/04/19 0431  AST 63* 52* 46* 33 26  ALT 64* 56* 51* 41 33  ALKPHOS 107 91 89 81 67  BILITOT 1.6* 1.3* 1.3* 0.9 0.6  PROT 6.7 5.8* 5.9* 5.7* 5.2*  ALBUMIN 3.7 3.2* 3.2* 2.9* 2.5*   No results for input(s): LIPASE, AMYLASE in the last 168  hours. Recent Labs  Lab 10/01/19 0823  AMMONIA 29   Coagulation Profile: No results for input(s): INR, PROTIME in the last 168 hours. Cardiac Enzymes: Recent Labs  Lab 10/01/19 0758 10/02/19 0429  CKTOTAL 653* 290   BNP (last 3 results) No results for input(s): PROBNP in the last 8760 hours. HbA1C: Recent Labs    10/03/19 0409  HGBA1C 5.7*   CBG: Recent Labs  Lab 10/04/19 1105 10/04/19 1612 10/04/19 1917 10/05/19 0738 10/05/19 1151  GLUCAP 102* 145* 140* 137* 134*   Lipid Profile: No results  for input(s): CHOL, HDL, LDLCALC, TRIG, CHOLHDL, LDLDIRECT in the last 72 hours. Thyroid Function Tests: No results for input(s): TSH, T4TOTAL, FREET4, T3FREE, THYROIDAB in the last 72 hours. Anemia Panel: No results for input(s): VITAMINB12, FOLATE, FERRITIN, TIBC, IRON, RETICCTPCT in the last 72 hours. Urine analysis:    Component Value Date/Time   COLORURINE YELLOW 10/01/2019 1146   APPEARANCEUR TURBID (A) 10/01/2019 1146   LABSPEC 1.014 10/01/2019 1146   PHURINE 5.0 10/01/2019 1146   GLUCOSEU 50 (A) 10/01/2019 1146   HGBUR SMALL (A) 10/01/2019 1146   BILIRUBINUR NEGATIVE 10/01/2019 1146   KETONESUR 5 (A) 10/01/2019 1146   PROTEINUR NEGATIVE 10/01/2019 1146   NITRITE NEGATIVE 10/01/2019 1146   LEUKOCYTESUR NEGATIVE 10/01/2019 1146   Sepsis Labs: @LABRCNTIP (procalcitonin:4,lacticidven:4)  ) Recent Results (from the past 240 hour(s))  SARS Coronavirus 2 by RT PCR (hospital order, performed in [facility] hospital lab) Nasopharyngeal Nasopharyngeal Swab     Status: None   Collection Time: 10/01/19  4:59 AM   Specimen: Nasopharyngeal Swab  Result Value Ref Range Status   SARS Coronavirus 2 NEGATIVE NEGATIVE Final    Comment: (NOTE) SARS-CoV-2 target nucleic acids are NOT DETECTED.  The SARS-CoV-2 RNA is generally detectable in upper and lower respiratory specimens during the acute phase of infection. The lowest concentration of SARS-CoV-2 viral copies this assay  can detect is 250 copies / mL. A negative result does not preclude SARS-CoV-2 infection and should not be used as the sole basis for treatment or other patient management decisions.  A negative result may occur with improper specimen collection / handling, submission of specimen other than nasopharyngeal swab, presence of viral mutation(s) within the areas targeted by this assay, and inadequate number of viral copies (<250 copies / mL). A negative result must be combined with clinical observations, patient history, and epidemiological information.  Fact Sheet for Patients:   StrictlyIdeas.no  Fact Sheet for Healthcare Providers: BankingDealers.co.za  This test is not yet approved or  cleared by the [place] FDA and has been authorized for detection and/or diagnosis of SARS-CoV-2 by FDA under an Emergency Use Authorization (EUA).  This EUA will remain in effect (meaning this test can be used) for the duration of the COVID-19 declaration under Section 564(b)(1) of the Act, 21 U.S.C. section 360bbb-3(b)(1), unless the authorization is terminated or revoked sooner.  Performed at [facility], Between [address] [place], [place] [address]   Urine Culture     Status: Abnormal   Collection Time: 10/01/19 11:46 AM   Specimen: Urine, Random  Result Value Ref Range Status   Specimen Description   Final    URINE, RANDOM Performed at [facility] [address], [place], [place] [address]    Special Requests   Final    NONE Performed at [facility], [place] [address] [place], [place] [address]    Culture (A)  Final    <10,000 COLONIES/mL INSIGNIFICANT GROWTH Performed at [facility] [address], [place], [place] [address]    Report Status 10/03/2019 FINAL  Final      Studies: No results found.  Scheduled Meds:  chlordiazePOXIDE  25 mg Oral BH-qamhs   Followed by    Derrill Memo ON 10/06/2019] chlordiazePOXIDE  25 mg Oral Daily   enoxaparin (LOVENOX) injection  40 mg Subcutaneous Q24H   feeding supplement (ENSURE ENLIVE)  237 mL Oral BID BM   insulin aspart  0-9 Units Subcutaneous TID WC   metoprolol  tartrate  25 mg Oral BID   multivitamin with minerals  1 tablet Oral Daily   pantoprazole  40 mg Oral Daily   senna-docusate  1 tablet Oral BID   thiamine  100 mg Oral Daily   vitamin B-12  1,000 mcg Oral Daily    Continuous Infusions:    LOS: 4 days     Alma Friendly, MD [facility]  If 7PM-7AM, please contact night-coverage [email or web address] 10/05/2019, 2:45 PM

## 2019-10-06 DIAGNOSIS — D649 Anemia, unspecified: Secondary | ICD-10-CM

## 2019-10-06 DIAGNOSIS — F1010 Alcohol abuse, uncomplicated: Secondary | ICD-10-CM

## 2019-10-06 LAB — GLUCOSE, CAPILLARY
Glucose-Capillary: 106 mg/dL — ABNORMAL HIGH (ref 70–99)
Glucose-Capillary: 155 mg/dL — ABNORMAL HIGH (ref 70–99)

## 2019-10-06 MED ORDER — CYANOCOBALAMIN 1000 MCG PO TABS: 1000.0000 ug | ORAL_TABLET | Freq: Every day | ORAL | 0 refills | Status: AC

## 2019-10-06 MED ORDER — ATORVASTATIN CALCIUM 20 MG PO TABS: 20.0000 mg | ORAL_TABLET | Freq: Every day | ORAL | 0 refills | Status: DC

## 2019-10-06 MED ORDER — METOPROLOL TARTRATE 25 MG PO TABS
25.0000 mg | ORAL_TABLET | Freq: Two times a day (BID) | ORAL | 0 refills | Status: DC
Start: 2019-10-06 — End: 2020-09-14

## 2019-10-06 MED ORDER — TRAZODONE HCL 50 MG PO TABS: 50.0000 mg | ORAL_TABLET | Freq: Every evening | ORAL | 0 refills | Status: DC | PRN

## 2019-10-06 NOTE — Care Management Important Message (Signed)
Important Message  Patient Details IM Letter given to Gabriel Earing RN Case Manager to present to the Patient Name: Ruben [name] MRN: [id number] Date of Birth: June 11, 1950   Medicare Important Message Given:  Yes     Kerin Salen 10/06/2019, 11:23 AM

## 2019-10-06 NOTE — Discharge Summary (Signed)
Discharge Summary  Ruben [name] CXK:[id number] DOB: 08-30-1950  PCP: [name], MD  Admit date: 10/01/2019 Discharge date: 10/06/2019  Time spent: 40 mins  Recommendations for Outpatient Follow-up:  1. PCP in 1 week 2. Plans to go to [facility] in [facility] for alcohol rehab    Discharge Diagnoses:  Active Hospital Problems   Diagnosis Date Noted  . ARF (acute renal failure) ([place]) 10/01/2019  . Alcohol withdrawal ([place]) 10/01/2019  . Normochromic normocytic anemia 10/01/2019  . Essential hypertension 06/22/2019    Resolved Hospital Problems  No resolved problems to display.    Discharge Condition: Stable  Diet recommendation: Heart healthy  Vitals:   10/06/19 0853 10/06/19 1224  BP:  110/73  Pulse: (!) 110 84  Resp:  17  Temp:  97.7 F (36.5 C)  SpO2:  98%    History of present illness:  Ruben [name] a 69 y.o.malewithhistory of alcoholism who has not had any alcohol for last 2 days since patient was trying to detox himself presents to the ER because he has been having falls frequently last 2 days. Has not complained of any chest pain or shortness of breath nausea vomiting or diarrhea. Most of the history was obtained from the ER physician since patient is confused at the time of my exam. In the ER patient was found to be diaphoretic and tremulous. Patient was placed on CIWA protocol, CT head was unremarkable, EKG shows sinus tachycardia. Labs show creatinine which has increased from 1.17 in May 24 to around 2.7. BUN is 104. CBC shows hemoglobin of 12.3 platelets 85. At the time of my exam patient appears completely confused. Per ER physician patient was alert and awake when ER physician saw pt. Confusion worsened probably likely 2/2 Ativan.    Today, patient denies any new complaints, reports feeling better, denies any chest pain, shortness of breath, abdominal pain, nausea/vomiting, fever/chills.  And an extensive conversation with patient  and his cousin Ms. Candy, who plans to take the patient to her home in [place] to take care of him.  She plans to set him up with outpatient PT/OT there are [place] and eventually make arrangements to send him to [facility] in [place] for alcoholic rehab.  Patient stable to discharge from the hospital.    Hospital Course:  Principal Problem:   ARF (acute renal failure) (HCC) Active Problems:   Essential hypertension   Alcohol withdrawal (HCC)   Normochromic normocytic anemia   Acute metabolic encephalopathy- Resolved Likely 2/2 alcohol withdrawal/medication induced Currently afebrile, with no leukocytosis Ammonia level 29, mildly elevated LFTs with T bili 1.3, resolved UA unremarkable for infection, UC with insignificant growth Chest x-ray unremarkable Right upper quadrant ultrasound noted fatty liver CT head unremarkable  Mild rhabdomyolysis Resolved CK level 653 on admission  AKI Resolved Likely 2/2 poor oral intake, in addition to rhabdo S/p IV fluids  Hyponatremia/hypokalemia/hypomagnesemia Replaced as needed  Thrombocytopenia/vitamin B12 DEF Likely 2/2 alcohol abuse Anemia panel showed iron 39, TIBC 153, sats 26, ferritin 950, folate 10.4, vitamin B12 265 Continue vitamin B12 supplements  Alcohol abuse/withdrawal Continue thiamine, B12 supplementation Completed Librium detox regimen Plan to go to [facility] for alcohol detox as per cousin and patient   Hypertension BP stable Continue metoprolol, discontinued patient's lisinopril/HCTZ due to controlled BP and frequent falls  Frequent falls Likely 2/2 alcohol abuse/neuropathy CT head unremarkable X-ray pelvis unremarkable PT/OT-recommend home health PT/OT, but patient is currently going to stay with his  cousin in [place] who plans to take him to outpatient PT/OT.  Prescription done for outpatient PT/OT          Malnutrition Type:  Nutrition Problem: Increased nutrient  needs Etiology:  (substance abuse, ETOH)   Malnutrition Characteristics:  Signs/Symptoms: estimated needs   Nutrition Interventions:  Interventions: Ensure Enlive (each supplement provides 350kcal and 20 grams of protein), MVI   Estimated body mass index is 24.26 kg/m as calculated from the following:   Height as of this encounter: 5\' 11"  (1.803 m).   Weight as of this encounter: 78.9 kg.    Procedures:  None  Consultations:  None  Discharge Exam: BP 110/73 (BP Location: Left Arm)   Pulse 84   Temp 97.7 F (36.5 C) (Oral)   Resp 17   Ht 5\' 11"  (1.803 m)   Wt 78.9 kg   SpO2 98%   BMI 24.26 kg/m   General: NAD Cardiovascular: S1, S2 present Respiratory: CTA B     Discharge Instructions You were cared for by a hospitalist during your hospital stay. If you have any questions about your discharge medications or the care you received while you were in the hospital after you are discharged, you can call the unit and asked to speak with the hospitalist on call if the hospitalist that took care of you is not available. Once you are discharged, your primary care physician will handle any further medical issues. Please note that NO REFILLS for any discharge medications will be authorized once you are discharged, as it is imperative that you return to your primary care physician (or establish a relationship with a primary care physician if you do not have one) for your aftercare needs so that they can reassess your need for medications and monitor your lab values.  Discharge Instructions    Diet - low sodium heart healthy   Complete by: As directed    Increase activity slowly   Complete by: As directed      Allergies as of 10/06/2019   No Known Allergies     Medication List    STOP taking these medications   chlordiazePOXIDE 25 MG capsule Commonly known as: LIBRIUM   lisinopril-hydrochlorothiazide 20-12.5 MG tablet Commonly known as: ZESTORETIC     TAKE these  medications   atorvastatin 20 MG tablet Commonly known as: LIPITOR Take 1 tablet (20 mg total) by mouth daily.   cyanocobalamin 1000 MCG tablet Take 1 tablet (1,000 mcg total) by mouth daily. Start taking on: October 07, 2019   docusate sodium 100 MG capsule Commonly known as: COLACE Take 1 capsule (100 mg total) by mouth 2 (two) times daily. Ok to stop taking if having loose bowel movements   folic acid 1 MG tablet Commonly known as: FOLVITE Take 1 tablet (1 mg total) by mouth daily.   metoprolol tartrate 25 MG tablet Commonly known as: LOPRESSOR Take 1 tablet (25 mg total) by mouth 2 (two) times daily.   multivitamin with minerals Tabs tablet Take 1 tablet by mouth daily.   thiamine 100 MG tablet Take 1 tablet (100 mg total) by mouth daily.   traZODone 50 MG tablet Commonly known as: DESYREL Take 1 tablet (50 mg total) by mouth at bedtime as needed for sleep. What changed:   medication strength  how much to take      No Known Allergies  Follow-up Information    [name], MD. Schedule an appointment as soon as possible for a visit in  1 week(s).   Specialty: Family Medicine Contact information: [place] [address] [phone]                The results of significant diagnostics from this hospitalization (including imaging, microbiology, ancillary and laboratory) are listed below for reference.    Significant Diagnostic Studies: DG Pelvis 1-2 Views  Result Date: 10/01/2019 CLINICAL DATA:  Multiple recent falls.  Alcohol withdrawal. EXAM: PELVIS - 1-2 VIEW COMPARISON:  None. FINDINGS: No acute or healing fractures are present. Mild degenerative changes are present in the hips, right greater than left. IMPRESSION: Mild degenerative changes of the hips, right greater than left. Electronically Signed   By: San Morelle M.D.   On: 10/01/2019 07:12   CT Head Wo Contrast  Result Date: 10/01/2019 CLINICAL DATA:  Multiple falls.  Patient feels off balance. Fall yesterday with trauma to head. EXAM: CT HEAD WITHOUT CONTRAST TECHNIQUE: Contiguous axial images were obtained from the base of the skull through the vertex without intravenous contrast. COMPARISON:  CT head without contrast 07/30/2019 at [place] and 06/21/2019 at [place]: Brain: Mild atrophy and white matter changes are likely within normal limits for age. There is no significant interval change. No acute infarct, hemorrhage, or mass lesion is present. The ventricles are of normal size. No significant extraaxial fluid collection is present. Vascular: Atherosclerotic calcifications are present within the cavernous internal carotid arteries and at the dural margin of the left vertebral artery. No hyperdense vessel is present. Skull: Calvarium is intact. No focal lytic or blastic lesions are present. No significant extracranial soft tissue lesion is present. Sinuses/Orbits: Minimal mucosal thickening is present within the anterior ethmoid air cells. Remaining paranasal sinuses and mastoid air cells are clear. The globes and orbits are within normal limits. IMPRESSION: 1. No acute intracranial abnormality or significant interval change. 2. Stable atrophy and white matter disease, likely within normal limits for age. 3. Atherosclerosis. Electronically Signed   By: San Morelle M.D.   On: 10/01/2019 05:25   DG Chest Port 1 View  Result Date: 10/01/2019 CLINICAL DATA:  Detox EXAM: PORTABLE CHEST 1 VIEW COMPARISON:  03/31/2019 FINDINGS: The heart size and mediastinal contours are within normal limits. Non inclusion of left CP angle. Both lungs are clear. The visualized skeletal structures are unremarkable. IMPRESSION: No active disease. Electronically Signed   By: Donavan Foil M.D.   On: 10/01/2019 16:15   US Abdomen Limited RUQ  Result Date: 10/01/2019 CLINICAL DATA:  Elevated liver function tests. EXAM:  ULTRASOUND ABDOMEN LIMITED RIGHT UPPER QUADRANT COMPARISON:  None. FINDINGS: Gallbladder: No gallstones or wall thickening visualized (1.7 mm). No sonographic Murphy sign noted by sonographer. Common bile duct: Diameter: 3.5 mm Liver: No focal lesion identified. The left lobe of the liver is limited in evaluation secondary to overlying bowel gas. Diffusely increased echogenicity of the liver parenchyma is noted. Portal vein is patent on color Doppler imaging with normal direction of blood flow towards the liver. Other: None. IMPRESSION: Fatty liver. Electronically Signed   By: Virgina Norfolk M.D.   On: 10/01/2019 16:34    Microbiology: Recent Results (from the past 240 hour(s))  SARS Coronavirus 2 by RT PCR (hospital order, performed in [facility] hospital lab) Nasopharyngeal Nasopharyngeal Swab     Status: None   Collection Time: 10/01/19  4:59 AM   Specimen: Nasopharyngeal Swab  Result Value Ref Range Status   SARS Coronavirus 2 NEGATIVE NEGATIVE Final  Comment: (NOTE) SARS-CoV-2 target nucleic acids are NOT DETECTED.  The SARS-CoV-2 RNA is generally detectable in upper and lower respiratory specimens during the acute phase of infection. The lowest concentration of SARS-CoV-2 viral copies this assay can detect is 250 copies / mL. A negative result does not preclude SARS-CoV-2 infection and should not be used as the sole basis for treatment or other patient management decisions.  A negative result may occur with improper specimen collection / handling, submission of specimen other than nasopharyngeal swab, presence of viral mutation(s) within the areas targeted by this assay, and inadequate number of viral copies (<250 copies / mL). A negative result must be combined with clinical observations, patient history, and epidemiological information.  Fact Sheet for Patients:   StrictlyIdeas.no  Fact Sheet for Healthcare  Providers: BankingDealers.co.za  This test is not yet approved or  cleared by the [place] FDA and has been authorized for detection and/or diagnosis of SARS-CoV-2 by FDA under an Emergency Use Authorization (EUA).  This EUA will remain in effect (meaning this test can be used) for the duration of the COVID-19 declaration under Section 564(b)(1) of the Act, 21 U.S.C. section 360bbb-3(b)(1), unless the authorization is terminated or revoked sooner.  Performed at [facility], [place] [address], [place], [place] [address]   Urine Culture     Status: Abnormal   Collection Time: 10/01/19 11:46 AM   Specimen: Urine, Random  Result Value Ref Range Status   Specimen Description   Final    URINE, RANDOM Performed at [place] [address] [place], [place] [address]    Special Requests   Final    NONE Performed at [facility], [place] [address], [place], [place] [address]    Culture (A)  Final    <10,000 COLONIES/mL INSIGNIFICANT GROWTH Performed at [place] [address], [place], [place] [address]    Report Status 10/03/2019 FINAL  Final     Labs: Basic Metabolic Panel: Recent Labs  Lab 10/01/19 0334 10/01/19 0334 10/01/19 0758 10/02/19 0429 10/03/19 0409 10/04/19 0431 10/04/19 0807 10/05/19 0455  NA 131*   < > 132* 133* 135 131*  --  136  K 3.9   < > 3.5 3.3* 3.5 3.2*  --  3.8  CL 92*   < > 95* 98 101 97*  --  97*  CO2 22   < > 22 24 26 28   --  28  GLUCOSE 153*   < > 135* 174* 125* 155*  --  101*  BUN 104*   < > 89* 56* 25* 19  --  16  CREATININE 2.76*   < > 2.19* 1.41* 1.11 1.22  --  1.12  CALCIUM 8.2*   < > 7.6* 8.3* 7.9* 7.7*  --  8.2*  MG 2.7*  --   --   --   --   --  1.3* 2.2   < > = values in this interval not displayed.   Liver Function Tests: Recent Labs  Lab 10/01/19 0334 10/01/19 0758 10/02/19 0429 10/03/19 0409 10/04/19 0431  AST 63* 52* 46* 33 26  ALT  64* 56* 51* 41 33  ALKPHOS 107 91 89 81 67  BILITOT 1.6* 1.3* 1.3* 0.9 0.6  PROT 6.7 5.8* 5.9* 5.7* 5.2*  ALBUMIN 3.7 3.2* 3.2* 2.9* 2.5*   No results for input(s): LIPASE, AMYLASE in the last 168 hours. Recent Labs  Lab 10/01/19 0823  AMMONIA 29  CBC: Recent Labs  Lab 10/01/19 0334 10/01/19 0758 10/02/19 0429 10/03/19 0409 10/04/19 0431  WBC 6.6 3.8* 5.2 6.6 5.8  NEUTROABS 5.1 2.8 3.7 4.4 3.5  HGB 12.3* 8.8* 12.2* 12.1* 11.1*  HCT 36.1* 25.8* 35.6* 36.8* 33.2*  MCV 97.6 98.1 97.5 98.9 97.4  PLT 85* 65* 102* 140* 166   Cardiac Enzymes: Recent Labs  Lab 10/01/19 0758 10/02/19 0429  CKTOTAL 653* 290   BNP: BNP (last 3 results) No results for input(s): BNP in the last 8760 hours.  ProBNP (last 3 results) No results for input(s): PROBNP in the last 8760 hours.  CBG: Recent Labs  Lab 10/05/19 1151 10/05/19 1618 10/05/19 2030 10/06/19 0727 10/06/19 1223  GLUCAP 134* 122* 133* 106* 155*       Signed:  Alma Friendly, MD [facility] 10/06/2019, 2:03 PM

## 2019-10-06 NOTE — Progress Notes (Signed)
Physical Therapy Treatment Patient Details Name: Ruben [name] MRN: [id number] DOB: Jul 07, 1950 Today's Date: 10/06/2019    History of Present Illness 69 y.o. male with history of alcoholism who has not had any alcohol for  2 days prior to admission; patient was trying to detox himself presents to the ER d/t falls; admitted with ARF, placed on CIWA protocol    PT Comments    Pt continues to demonstrate improvement in strength, able to get in/out of bed without physical assist, only requiring increased cues for safety. Pt able to ambulate with RW demonstrating wide BOS with flat foot posture and increased lateral weightshifting, but no unsteadiness or loss of balance. HR varied with ambulation and therapeutic exercise, 139 noted as max with gait and HR 90s-110s with exercise; pt denies dizziness, lightheadedness, racing sensation, and pain. Patient will benefit from continued physical therapy in hospital and recommendations below to increase strength, balance, endurance for safe ADLs and gait.   Follow Up Recommendations  Home health PT;Supervision - Intermittent (HHOT)     Equipment Recommendations  Other (comment) (has RW )    Recommendations for Other Services       Precautions / Restrictions Precautions Precautions: Fall Precaution Comments: monitor HR Restrictions Weight Bearing Restrictions: No    Mobility  Bed Mobility Overal bed mobility: Needs Assistance  Supine to sit: Supervision Sit to supine: Supervision   General bed mobility comments: cues for safety due to trying to stand and mobilize immediately once seated with condom cath line increasing risk for falls, no physical assist  Transfers Overall transfer level: Needs assistance Equipment used: Rolling walker (2 wheeled) Transfers: Sit to/from Stand Sit to Stand: Supervision  General transfer comment: BLE braced on bed to rise, slightly unsteady upon rising, improves with time  Ambulation/Gait Ambulation/Gait  assistance: Min guard Gait Distance (Feet): 250 Feet Assistive device: Rolling walker (2 wheeled) Gait Pattern/deviations: Step-through pattern;Decreased stride length;Wide base of support Gait velocity: decreased   General Gait Details: increased lateral weight-shifting, flat foot throughout gait cycle, no unsteadiness or LOB, HR 120-132 with 139 max noted brifely   Stairs             Wheelchair Mobility    Modified Rankin (Stroke Patients Only)       Balance Overall balance assessment: Needs assistance Sitting-balance support: Feet supported Sitting balance-Leahy Scale: Good Sitting balance - Comments: seated EOB   Standing balance support: During functional activity;Bilateral upper extremity supported Standing balance-Leahy Scale: Fair Standing balance comment: with RW     Cognition Arousal/Alertness: Awake/alert Behavior During Therapy: WFL for tasks assessed/performed Overall Cognitive Status: Within Functional Limits for tasks assessed         Exercises General Exercises - Lower Extremity Quad Sets: Supine;Both;15 reps Straight Leg Raises: Supine;Both;15 reps    General Comments        Pertinent Vitals/Pain Pain Assessment: No/denies pain    Home Living                      Prior Function            PT Goals (current goals can now be found in the care plan section) Acute Rehab PT Goals Patient Stated Goal: start moving around PT Goal Formulation: With patient Time For Goal Achievement: 10/16/19 Potential to Achieve Goals: Good Progress towards PT goals: Progressing toward goals    Frequency    Min 3X/week      PT Plan Current plan remains appropriate  Co-evaluation              AM-PAC PT "6 Clicks" Mobility   Outcome Measure  Help needed turning from your back to your side while in a flat bed without using bedrails?: None Help needed moving from lying on your back to sitting on the side of a flat bed without  using bedrails?: None Help needed moving to and from a bed to a chair (including a wheelchair)?: A Little Help needed standing up from a chair using your arms (e.g., wheelchair or bedside chair)?: A Little Help needed to walk in hospital room?: A Little Help needed climbing 3-5 steps with a railing? : A Little 6 Click Score: 20    End of Session   Activity Tolerance: Patient tolerated treatment well Patient left: in bed;with call bell/phone within reach;with bed alarm set Nurse Communication: Mobility status PT Visit Diagnosis: Other abnormalities of gait and mobility (R26.89);Repeated falls (R29.6);Muscle weakness (generalized) (M62.81)     Time: 1103-1594 PT Time Calculation (min) (ACUTE ONLY): 20 min  Charges:  $Gait Training: 8-22 mins                      Tori Nakiesha Rumsey PT, DPT 10/06/19, 2:40 PM

## 2019-10-06 NOTE — TOC Progression Note (Signed)
Transition of Care [facility]) - Progression Note    Patient Details  Name: YAQUB ARNEY MRN: [id number] Date of Birth: 04/30/1950  [facility] CM/SW Contact  Purcell Mouton, RN Phone Number: 10/06/2019, 2:56 PM  Clinical Narrative:    A call was made to pt's daughter [name] with no answer. Left VM to explain that pt will need to go to MD at her home town in order to have orders for OP PT or  HH several because a local MD will need to sign orders. Pt received a RW on 06/29/19 from [employer]. Insurance will not pay for a second RW within five years. Pt's walker is at home. Daughter may purchase another one from [employer], [employer], [employer], [employer], [employer] etc. or get pt's RW from his home. Left CM's numbers for daughter.    Expected Discharge Plan: [place] Barriers to Discharge: No Barriers Identified  Expected Discharge Plan and Services Expected Discharge Plan: [place] arrangements for the past 2 months: [place] Expected Discharge Date: 10/06/19                                     Social Determinants of Health (SDOH) Interventions    Readmission Risk Interventions No flowsheet data found.

## 2019-10-16 DIAGNOSIS — E7800 Pure hypercholesterolemia, unspecified: Secondary | ICD-10-CM | POA: Diagnosis not present

## 2019-10-16 DIAGNOSIS — I10 Essential (primary) hypertension: Secondary | ICD-10-CM | POA: Diagnosis not present

## 2019-10-16 DIAGNOSIS — E782 Mixed hyperlipidemia: Secondary | ICD-10-CM | POA: Diagnosis not present

## 2019-10-16 DIAGNOSIS — N400 Enlarged prostate without lower urinary tract symptoms: Secondary | ICD-10-CM | POA: Diagnosis not present

## 2019-10-21 DIAGNOSIS — R2681 Unsteadiness on feet: Secondary | ICD-10-CM | POA: Diagnosis not present

## 2019-10-21 DIAGNOSIS — R262 Difficulty in walking, not elsewhere classified: Secondary | ICD-10-CM | POA: Diagnosis not present

## 2019-10-21 DIAGNOSIS — M6281 Muscle weakness (generalized): Secondary | ICD-10-CM | POA: Diagnosis not present

## 2019-10-23 DIAGNOSIS — R2681 Unsteadiness on feet: Secondary | ICD-10-CM | POA: Diagnosis not present

## 2019-10-23 DIAGNOSIS — M6281 Muscle weakness (generalized): Secondary | ICD-10-CM | POA: Diagnosis not present

## 2019-10-23 DIAGNOSIS — R262 Difficulty in walking, not elsewhere classified: Secondary | ICD-10-CM | POA: Diagnosis not present

## 2019-10-28 DIAGNOSIS — R262 Difficulty in walking, not elsewhere classified: Secondary | ICD-10-CM | POA: Diagnosis not present

## 2019-10-28 DIAGNOSIS — M6281 Muscle weakness (generalized): Secondary | ICD-10-CM | POA: Diagnosis not present

## 2019-10-28 DIAGNOSIS — R2681 Unsteadiness on feet: Secondary | ICD-10-CM | POA: Diagnosis not present

## 2019-10-30 DIAGNOSIS — R2681 Unsteadiness on feet: Secondary | ICD-10-CM | POA: Diagnosis not present

## 2019-10-30 DIAGNOSIS — M6281 Muscle weakness (generalized): Secondary | ICD-10-CM | POA: Diagnosis not present

## 2019-10-30 DIAGNOSIS — R262 Difficulty in walking, not elsewhere classified: Secondary | ICD-10-CM | POA: Diagnosis not present

## 2019-11-04 DIAGNOSIS — R262 Difficulty in walking, not elsewhere classified: Secondary | ICD-10-CM | POA: Diagnosis not present

## 2019-11-04 DIAGNOSIS — M6281 Muscle weakness (generalized): Secondary | ICD-10-CM | POA: Diagnosis not present

## 2019-11-04 DIAGNOSIS — R2681 Unsteadiness on feet: Secondary | ICD-10-CM | POA: Diagnosis not present

## 2019-11-06 DIAGNOSIS — R2681 Unsteadiness on feet: Secondary | ICD-10-CM | POA: Diagnosis not present

## 2019-11-06 DIAGNOSIS — M6281 Muscle weakness (generalized): Secondary | ICD-10-CM | POA: Diagnosis not present

## 2019-11-06 DIAGNOSIS — R262 Difficulty in walking, not elsewhere classified: Secondary | ICD-10-CM | POA: Diagnosis not present

## 2019-11-11 DIAGNOSIS — R2681 Unsteadiness on feet: Secondary | ICD-10-CM | POA: Diagnosis not present

## 2019-11-11 DIAGNOSIS — R262 Difficulty in walking, not elsewhere classified: Secondary | ICD-10-CM | POA: Diagnosis not present

## 2019-11-11 DIAGNOSIS — M6281 Muscle weakness (generalized): Secondary | ICD-10-CM | POA: Diagnosis not present

## 2019-11-13 DIAGNOSIS — M6281 Muscle weakness (generalized): Secondary | ICD-10-CM | POA: Diagnosis not present

## 2019-11-13 DIAGNOSIS — N400 Enlarged prostate without lower urinary tract symptoms: Secondary | ICD-10-CM | POA: Diagnosis not present

## 2019-11-13 DIAGNOSIS — E782 Mixed hyperlipidemia: Secondary | ICD-10-CM | POA: Diagnosis not present

## 2019-11-13 DIAGNOSIS — E7800 Pure hypercholesterolemia, unspecified: Secondary | ICD-10-CM | POA: Diagnosis not present

## 2019-11-13 DIAGNOSIS — R2681 Unsteadiness on feet: Secondary | ICD-10-CM | POA: Diagnosis not present

## 2019-11-13 DIAGNOSIS — R262 Difficulty in walking, not elsewhere classified: Secondary | ICD-10-CM | POA: Diagnosis not present

## 2019-11-13 DIAGNOSIS — I10 Essential (primary) hypertension: Secondary | ICD-10-CM | POA: Diagnosis not present

## 2019-12-22 ENCOUNTER — Inpatient Hospital Stay (HOSPITAL_COMMUNITY)
Admission: EM | Admit: 2019-12-22 | Discharge: 2019-12-30 | DRG: 897 | Disposition: A | Discharge status: Skilled Nursing Facility | Payer: Medicare Other | Attending: Student | Admitting: Student

## 2019-12-22 ENCOUNTER — Encounter (HOSPITAL_COMMUNITY): Payer: Self-pay | Admitting: Internal Medicine

## 2019-12-22 ENCOUNTER — Other Ambulatory Visit: Payer: Self-pay

## 2019-12-22 DIAGNOSIS — F10230 Alcohol dependence with withdrawal, uncomplicated: Secondary | ICD-10-CM

## 2019-12-22 DIAGNOSIS — E871 Hypo-osmolality and hyponatremia: Secondary | ICD-10-CM | POA: Diagnosis present

## 2019-12-22 DIAGNOSIS — R5381 Other malaise: Secondary | ICD-10-CM | POA: Diagnosis present

## 2019-12-22 DIAGNOSIS — H532 Diplopia: Secondary | ICD-10-CM | POA: Diagnosis present

## 2019-12-22 DIAGNOSIS — E876 Hypokalemia: Secondary | ICD-10-CM | POA: Diagnosis present

## 2019-12-22 DIAGNOSIS — G4700 Insomnia, unspecified: Secondary | ICD-10-CM | POA: Diagnosis present

## 2019-12-22 DIAGNOSIS — D61818 Other pancytopenia: Secondary | ICD-10-CM | POA: Diagnosis present

## 2019-12-22 DIAGNOSIS — F410 Panic disorder [episodic paroxysmal anxiety] without agoraphobia: Secondary | ICD-10-CM | POA: Diagnosis not present

## 2019-12-22 DIAGNOSIS — R739 Hyperglycemia, unspecified: Secondary | ICD-10-CM | POA: Diagnosis present

## 2019-12-22 DIAGNOSIS — D696 Thrombocytopenia, unspecified: Secondary | ICD-10-CM | POA: Diagnosis not present

## 2019-12-22 DIAGNOSIS — K219 Gastro-esophageal reflux disease without esophagitis: Secondary | ICD-10-CM | POA: Diagnosis present

## 2019-12-22 DIAGNOSIS — Z20822 Contact with and (suspected) exposure to covid-19: Secondary | ICD-10-CM | POA: Diagnosis present

## 2019-12-22 DIAGNOSIS — E162 Hypoglycemia, unspecified: Secondary | ICD-10-CM | POA: Diagnosis not present

## 2019-12-22 DIAGNOSIS — E538 Deficiency of other specified B group vitamins: Secondary | ICD-10-CM | POA: Diagnosis present

## 2019-12-22 DIAGNOSIS — N400 Enlarged prostate without lower urinary tract symptoms: Secondary | ICD-10-CM | POA: Diagnosis present

## 2019-12-22 DIAGNOSIS — K5900 Constipation, unspecified: Secondary | ICD-10-CM | POA: Diagnosis present

## 2019-12-22 DIAGNOSIS — R0902 Hypoxemia: Secondary | ICD-10-CM | POA: Diagnosis not present

## 2019-12-22 DIAGNOSIS — I6529 Occlusion and stenosis of unspecified carotid artery: Secondary | ICD-10-CM | POA: Diagnosis not present

## 2019-12-22 DIAGNOSIS — J3489 Other specified disorders of nose and nasal sinuses: Secondary | ICD-10-CM | POA: Diagnosis not present

## 2019-12-22 DIAGNOSIS — E872 Acidosis, unspecified: Secondary | ICD-10-CM | POA: Diagnosis present

## 2019-12-22 DIAGNOSIS — Z79899 Other long term (current) drug therapy: Secondary | ICD-10-CM | POA: Diagnosis not present

## 2019-12-22 DIAGNOSIS — F10939 Alcohol use, unspecified with withdrawal, unspecified: Secondary | ICD-10-CM | POA: Diagnosis present

## 2019-12-22 DIAGNOSIS — F10239 Alcohol dependence with withdrawal, unspecified: Principal | ICD-10-CM | POA: Diagnosis present

## 2019-12-22 DIAGNOSIS — E161 Other hypoglycemia: Secondary | ICD-10-CM | POA: Diagnosis not present

## 2019-12-22 DIAGNOSIS — I10 Essential (primary) hypertension: Secondary | ICD-10-CM | POA: Diagnosis not present

## 2019-12-22 DIAGNOSIS — F419 Anxiety disorder, unspecified: Secondary | ICD-10-CM | POA: Diagnosis not present

## 2019-12-22 DIAGNOSIS — F10139 Alcohol abuse with withdrawal, unspecified: Secondary | ICD-10-CM | POA: Diagnosis present

## 2019-12-22 DIAGNOSIS — N179 Acute kidney failure, unspecified: Secondary | ICD-10-CM | POA: Diagnosis present

## 2019-12-22 DIAGNOSIS — R000 Tachycardia, unspecified: Secondary | ICD-10-CM | POA: Diagnosis not present

## 2019-12-22 DIAGNOSIS — R748 Abnormal levels of other serum enzymes: Secondary | ICD-10-CM | POA: Diagnosis not present

## 2019-12-22 DIAGNOSIS — F10929 Alcohol use, unspecified with intoxication, unspecified: Secondary | ICD-10-CM

## 2019-12-22 DIAGNOSIS — R2681 Unsteadiness on feet: Secondary | ICD-10-CM | POA: Diagnosis present

## 2019-12-22 DIAGNOSIS — R2689 Other abnormalities of gait and mobility: Secondary | ICD-10-CM | POA: Diagnosis present

## 2019-12-22 DIAGNOSIS — K2920 Alcoholic gastritis without bleeding: Secondary | ICD-10-CM | POA: Diagnosis present

## 2019-12-22 DIAGNOSIS — K7040 Alcoholic hepatic failure without coma: Secondary | ICD-10-CM | POA: Diagnosis present

## 2019-12-22 DIAGNOSIS — M6281 Muscle weakness (generalized): Secondary | ICD-10-CM | POA: Diagnosis present

## 2019-12-22 DIAGNOSIS — R41841 Cognitive communication deficit: Secondary | ICD-10-CM | POA: Diagnosis present

## 2019-12-22 DIAGNOSIS — J321 Chronic frontal sinusitis: Secondary | ICD-10-CM | POA: Diagnosis not present

## 2019-12-22 DIAGNOSIS — I672 Cerebral atherosclerosis: Secondary | ICD-10-CM | POA: Diagnosis not present

## 2019-12-22 DIAGNOSIS — E8729 Other acidosis: Secondary | ICD-10-CM

## 2019-12-22 LAB — CBC WITH DIFFERENTIAL/PLATELET
Abs Immature Granulocytes: 0.05 10*3/uL (ref 0.00–0.07)
Basophils Absolute: 0.1 10*3/uL (ref 0.0–0.1)
Basophils Relative: 1 %
Eosinophils Absolute: 0 10*3/uL (ref 0.0–0.5)
Eosinophils Relative: 0 %
HCT: 44.1 % (ref 39.0–52.0)
Hemoglobin: 13.7 g/dL (ref 13.0–17.0)
Immature Granulocytes: 1 %
Lymphocytes Relative: 10 %
Lymphs Abs: 1.1 10*3/uL (ref 0.7–4.0)
MCH: 31.1 pg (ref 26.0–34.0)
MCHC: 31.1 g/dL (ref 30.0–36.0)
MCV: 100.2 fL — ABNORMAL HIGH (ref 80.0–100.0)
Monocytes Absolute: 0.7 10*3/uL (ref 0.1–1.0)
Monocytes Relative: 6 %
Neutro Abs: 9.2 10*3/uL — ABNORMAL HIGH (ref 1.7–7.7)
Neutrophils Relative %: 82 %
Platelets: 99 10*3/uL — ABNORMAL LOW (ref 150–400)
RBC: 4.4 MIL/uL (ref 4.22–5.81)
RDW: 13.7 % (ref 11.5–15.5)
WBC: 11.1 10*3/uL — ABNORMAL HIGH (ref 4.0–10.5)
nRBC: 0 % (ref 0.0–0.2)

## 2019-12-22 LAB — COMPREHENSIVE METABOLIC PANEL
ALT: 51 U/L — ABNORMAL HIGH (ref 0–44)
AST: 69 U/L — ABNORMAL HIGH (ref 15–41)
Albumin: 3.9 g/dL (ref 3.5–5.0)
Alkaline Phosphatase: 82 U/L (ref 38–126)
Anion gap: 28 — ABNORMAL HIGH (ref 5–15)
BUN: 22 mg/dL (ref 8–23)
CO2: 8 mmol/L — ABNORMAL LOW (ref 22–32)
Calcium: 7.7 mg/dL — ABNORMAL LOW (ref 8.9–10.3)
Chloride: 103 mmol/L (ref 98–111)
Creatinine, Ser: 1.08 mg/dL (ref 0.61–1.24)
GFR calc Af Amer: >60 mL/min (ref >60)
GFR calc non Af Amer: >60 mL/min (ref >60)
Glucose, Bld: 89 mg/dL (ref 70–99)
Potassium: 4.5 mmol/L (ref 3.5–5.1)
Sodium: 139 mmol/L (ref 135–145)
Total Bilirubin: 0.8 mg/dL (ref 0.3–1.2)
Total Protein: 7.2 g/dL (ref 6.5–8.1)

## 2019-12-22 LAB — URINALYSIS, ROUTINE W REFLEX MICROSCOPIC
Bilirubin Urine: NEGATIVE
Glucose, UA: NEGATIVE mg/dL
Ketones, ur: 80 mg/dL — AB
Leukocytes,Ua: NEGATIVE
Nitrite: NEGATIVE
Protein, ur: 100 mg/dL — AB
Specific Gravity, Urine: 1.017 (ref 1.005–1.030)
pH: 5 (ref 5.0–8.0)

## 2019-12-22 LAB — SARS CORONAVIRUS 2 BY RT PCR (HOSPITAL ORDER, PERFORMED IN ~~LOC~~ HOSPITAL LAB): SARS Coronavirus 2: NEGATIVE

## 2019-12-22 LAB — BASIC METABOLIC PANEL
Anion gap: 21 — ABNORMAL HIGH (ref 5–15)
BUN: 32 mg/dL — ABNORMAL HIGH (ref 8–23)
CO2: 12 mmol/L — ABNORMAL LOW (ref 22–32)
Calcium: 7.6 mg/dL — ABNORMAL LOW (ref 8.9–10.3)
Chloride: 99 mmol/L (ref 98–111)
Creatinine, Ser: 1.56 mg/dL — ABNORMAL HIGH (ref 0.61–1.24)
GFR calc Af Amer: 52 mL/min — ABNORMAL LOW (ref >60)
GFR calc non Af Amer: 45 mL/min — ABNORMAL LOW (ref >60)
Glucose, Bld: 267 mg/dL — ABNORMAL HIGH (ref 70–99)
Potassium: 4.6 mmol/L (ref 3.5–5.1)
Sodium: 132 mmol/L — ABNORMAL LOW (ref 135–145)

## 2019-12-22 LAB — BLOOD GAS, VENOUS
Acid-base deficit: 21.5 mmol/L — ABNORMAL HIGH (ref 0.0–2.0)
Bicarbonate: 7.7 mmol/L — ABNORMAL LOW (ref 20.0–28.0)
O2 Saturation: 83.4 %
Patient temperature: 98.6
pCO2, Ven: 26.8 mmHg — ABNORMAL LOW (ref 44.0–60.0)
pH, Ven: 7.087 — CL (ref 7.250–7.430)
pO2, Ven: 64.3 mmHg — ABNORMAL HIGH (ref 32.0–45.0)

## 2019-12-22 LAB — TSH: TSH: 0.907 u[IU]/mL (ref 0.350–4.500)

## 2019-12-22 LAB — ETHANOL: Alcohol, Ethyl (B): 188 mg/dL — ABNORMAL HIGH (ref <10)

## 2019-12-22 MED ORDER — ENSURE ENLIVE PO LIQD
237.0000 mL | Freq: Two times a day (BID) | ORAL | Status: DC
  Administered 2019-12-23 – 2019-12-30 (×12): 237 mL via ORAL

## 2019-12-22 MED ORDER — LORAZEPAM 1 MG PO TABS
0.0000 mg | ORAL_TABLET | Freq: Four times a day (QID) | ORAL | Status: DC
  Administered 2019-12-22: 1 mg via ORAL
  Filled 2019-12-22: qty 1

## 2019-12-22 MED ORDER — ONDANSETRON HCL 4 MG PO TABS: 4.0000 mg | ORAL_TABLET | Freq: Four times a day (QID) | ORAL | Status: DC | PRN

## 2019-12-22 MED ORDER — LORAZEPAM 1 MG PO TABS
1.0000 mg | ORAL_TABLET | ORAL | Status: AC | PRN
  Administered 2019-12-23: 1 mg via ORAL
  Administered 2019-12-24: 2 mg via ORAL
  Filled 2019-12-22: qty 1
  Filled 2019-12-22: qty 2

## 2019-12-22 MED ORDER — ACETAMINOPHEN 325 MG PO TABS
650.0000 mg | ORAL_TABLET | Freq: Four times a day (QID) | ORAL | Status: DC | PRN
  Administered 2019-12-22: 650 mg via ORAL
  Filled 2019-12-22: qty 2

## 2019-12-22 MED ORDER — THIAMINE HCL 100 MG/ML IJ SOLN
Freq: Once | INTRAVENOUS | Status: DC
  Filled 2019-12-22: qty 1000

## 2019-12-22 MED ORDER — DEXTROSE IN LACTATED RINGERS 5 % IV SOLN: INTRAVENOUS | Status: DC

## 2019-12-22 MED ORDER — PANTOPRAZOLE SODIUM 40 MG IV SOLR
40.0000 mg | INTRAVENOUS | Status: DC
  Administered 2019-12-22 – 2019-12-23 (×2): 40 mg via INTRAVENOUS
  Filled 2019-12-22 (×3): qty 40

## 2019-12-22 MED ORDER — LORAZEPAM 2 MG/ML IJ SOLN
0.0000 mg | Freq: Two times a day (BID) | INTRAMUSCULAR | Status: AC
  Administered 2019-12-25: 2 mg via INTRAVENOUS
  Administered 2019-12-25: 1 mg via INTRAVENOUS
  Filled 2019-12-22: qty 2
  Filled 2019-12-22 (×4): qty 1

## 2019-12-22 MED ORDER — TRAZODONE HCL 50 MG PO TABS
50.0000 mg | ORAL_TABLET | Freq: Every evening | ORAL | Status: DC | PRN
  Administered 2019-12-24: 50 mg via ORAL
  Filled 2019-12-22: qty 1

## 2019-12-22 MED ORDER — LORAZEPAM 2 MG/ML IJ SOLN: 0.0000 mg | Freq: Four times a day (QID) | INTRAMUSCULAR | Status: DC

## 2019-12-22 MED ORDER — LORAZEPAM 2 MG/ML IJ SOLN
1.0000 mg | INTRAMUSCULAR | Status: AC | PRN
  Administered 2019-12-24: 2 mg via INTRAVENOUS
  Administered 2019-12-24: 3 mg via INTRAVENOUS
  Administered 2019-12-24: 1 mg via INTRAVENOUS
  Administered 2019-12-24 (×2): 2 mg via INTRAVENOUS
  Administered 2019-12-25: 3 mg via INTRAVENOUS
  Filled 2019-12-22: qty 1
  Filled 2019-12-22: qty 2
  Filled 2019-12-22 (×2): qty 1

## 2019-12-22 MED ORDER — THIAMINE HCL 100 MG/ML IJ SOLN
Freq: Once | INTRAVENOUS | Status: AC
  Filled 2019-12-22: qty 1000

## 2019-12-22 MED ORDER — ONDANSETRON HCL 4 MG/2ML IJ SOLN: 4.0000 mg | Freq: Four times a day (QID) | INTRAMUSCULAR | Status: DC | PRN

## 2019-12-22 MED ORDER — METOPROLOL TARTRATE 25 MG PO TABS: 25.0000 mg | ORAL_TABLET | Freq: Two times a day (BID) | ORAL | Status: DC

## 2019-12-22 MED ORDER — DEXTROSE 5 % IV SOLN: Freq: Once | INTRAVENOUS | Status: DC

## 2019-12-22 MED ORDER — CLONIDINE HCL 0.1 MG PO TABS
0.1000 mg | ORAL_TABLET | Freq: Two times a day (BID) | ORAL | Status: DC
  Administered 2019-12-22 – 2019-12-30 (×17): 0.1 mg via ORAL
  Filled 2019-12-22 (×17): qty 1

## 2019-12-22 MED ORDER — ATORVASTATIN CALCIUM 20 MG PO TABS
20.0000 mg | ORAL_TABLET | Freq: Every day | ORAL | Status: DC
  Administered 2019-12-22 – 2019-12-25 (×4): 20 mg via ORAL
  Filled 2019-12-22: qty 1
  Filled 2019-12-22: qty 2
  Filled 2019-12-22 (×2): qty 1

## 2019-12-22 MED ORDER — LORAZEPAM 2 MG/ML IJ SOLN
0.0000 mg | Freq: Four times a day (QID) | INTRAMUSCULAR | Status: AC
  Administered 2019-12-22: 2 mg via INTRAVENOUS
  Administered 2019-12-22 – 2019-12-23 (×4): 1 mg via INTRAVENOUS
  Administered 2019-12-23: 2 mg via INTRAVENOUS
  Filled 2019-12-22 (×6): qty 1

## 2019-12-22 NOTE — ED Notes (Signed)
   12/22/19 1000  Vitals  BP (!) 142/98  MAP (mmHg) 111  Pulse Rate (!) 139  ECG Heart Rate (!) 141  Resp 18  MEWS COLOR  MEWS Score Color Yellow  Oxygen Therapy  SpO2 96 %  MEWS Score  MEWS Temp 0  MEWS Systolic 0  MEWS Pulse 3  MEWS RR 0  MEWS LOC 0  MEWS Score 3   Ag[place], MD made aware of current HR.  No new orders.

## 2019-12-22 NOTE — ED Triage Notes (Signed)
Pt came in with c/o weakness and nausea that started approx 5 days ago. Pt is an alcoholic that drinks 86-82BR per day of liquor. He states his last drink was at 0000. Pt has HR of 140s to 150s.

## 2019-12-22 NOTE — Plan of Care (Signed)

## 2019-12-22 NOTE — H&P (Addendum)
History and Physical    Ruben [name] ZDG:[id number] DOB: 1950/11/12 DOA: 12/22/2019  PCP: [name], MD   Patient coming from: Home  I have personally briefly reviewed patient's old medical records in [place]  Chief Complaint: Weakness  HPI: Ruben [name] is a 69 y.o. male with medical history significant for alcoholism, aortic atherosclerosis, BPH, GERD who presents to the ER for evaluation of weakness and nausea.  Patient admits to binge drinking this weekend and called EMS because he noted some scaling of the skin around his fingers and toes which he recognized from his previous hospitalization in June when he had acute kidney injury. He also complains of nausea and weakness and states that he has had a poor appetite for several days.  Patient drinks about 12 - 15oz of liquor daily.  His last drink was at midnight.  Patient noted to be tachycardic with heart rate in the 140s - 150's upon arrival to the ER. He complains of nausea but denies having any emesis, he denies having any chest pain or shortness of breath, no dizziness or lightheadedness.  No fever, no chills, no urinary symptoms or changes in his bowel habits He has generalized tremors consistent with alcohol withdrawal. Labs show sodium 139, potassium 4.5, chloride 103, bicarbonate 8, anion gap of 28, BUN 22, creatinine 1.08, alkaline phosphatase 82, AST 69, ALT 51, white count 11.1, hemoglobin 13.7, MCV 100.2, platelet count 99. Twelve-lead EKG reviewed by me shows sinus tachycardia     ED Course: Patient is a 69 year old Caucasian male with a history of alcoholism who presents to the ER for evaluation of weakness and nausea as well as scaling of his skin which he had the last time he was hospitalized for acute kidney injury.  Patient admits to binge drinking and has had poor oral intake.  Patient with generalized tremors consistent with alcohol withdrawal and noted to have metabolic acidosis with an anion gap of  28.  He was started on IV fluid hydration with a banana bag and will be admitted to the hospital for further evaluation.  Review of Systems: As per HPI otherwise 10 point review of systems negative.    Past Medical History:  Diagnosis Date  . Alcoholism ([place])   . Aortic atherosclerosis ([place]) 12/26/2016   Noted on CT  . Arthritis   . BPH (benign prostatic hyperplasia)   . Chronic constipation    takes magnesium  . Duodenal ulcer   . Dyspnea 12/17/2017  . ED (erectile dysfunction)   . Foley catheter in place    history of no longer in place  . GERD (gastroesophageal reflux disease)   . Heart palpitations    Occ  . Hiatal hernia   . History of adenomatous polyp of colon    tubular adenoma's 2005  . History of Helicobacter pylori infection    2008  . Hypercholesteremia   . Hypertension   . MR (mitral regurgitation)    Mild, noted on ECHO  . Pre-diabetes   . Pulmonary nodule 12/26/2016   noted on CT, 5 x 3 mm nodular opacity right upper lobe  . Rosacea   . Schatzki's ring    last dilated 02/ 2016  . Urinary retention    history of  . Varicocele 02/25/2014   Small to moderate left varicocele    Past Surgical History:  Procedure Laterality Date  . BALLOON DILATION N/A 06/08/2014   Procedure: BALLOON DILATION;  Surgeon: Garlan Fair, MD;  Location: [facility];  Service: Endoscopy;  Laterality: N/A;  . COLONOSCOPY WITH PROPOFOL N/A 06/08/2014   Procedure: COLONOSCOPY WITH PROPOFOL;  Surgeon: Garlan Fair, MD;  Location: [facility];  Service: Endoscopy;  Laterality: N/A;  . ESOPHAGOGASTRODUODENOSCOPY N/A 06/08/2014   Procedure: ESOPHAGOGASTRODUODENOSCOPY (EGD);  Surgeon: Garlan Fair, MD;  Location: [facility];  Service: Endoscopy;  Laterality: N/A;  . ESOPHAGOGASTRODUODENOSCOPY (EGD) WITH ESOPHAGEAL DILATION N/A 12/31/2012   Procedure: ESOPHAGOGASTRODUODENOSCOPY (EGD) WITH ESOPHAGEAL DILATION;  Surgeon: Garlan Fair, MD;  Location: [facility];  Service:  Endoscopy;  Laterality: N/A;  . GREEN LIGHT LASER TURP (TRANSURETHRAL RESECTION OF PROSTATE N/A 12/28/2015   Procedure: GREEN LIGHT LASER TURP (TRANSURETHRAL RESECTION OF PROSTATE;  Surgeon: Festus Aloe, MD;  Location: [facility];  Service: Urology;  Laterality: N/A;     reports that he has never smoked. He has never used smokeless tobacco. He reports current alcohol use. He reports that he does not use drugs.  No Known Allergies  Family History  Problem Relation Age of Onset  . Heart failure Mother   . Stroke Father      Prior to Admission medications   Medication Sig Start Date End Date Taking? Authorizing Provider  atorvastatin (LIPITOR) 20 MG tablet Take 1 tablet (20 mg total) by mouth daily. 10/06/19 12/22/19 Yes Alma Friendly, MD  metoprolol tartrate (LOPRESSOR) 25 MG tablet Take 1 tablet (25 mg total) by mouth 2 (two) times daily. 10/06/19 12/22/19 Yes Alma Friendly, MD  traZODone (DESYREL) 50 MG tablet Take 1 tablet (50 mg total) by mouth at bedtime as needed for sleep. 10/06/19 12/22/19 Yes Alma Friendly, MD    Physical Exam: Vitals:   12/22/19 0603 12/22/19 0730 12/22/19 0733 12/22/19 0800  BP: (!) 150/81 (!) 146/84 (!) 146/84 (!) 161/87  Pulse: (!) 133 (!) 131 (!) 128 (!) 134  Resp: 18 16 15 14   Temp:      TempSrc:      SpO2: 97% 99% 99% 98%     Vitals:   12/22/19 0603 12/22/19 0730 12/22/19 0733 12/22/19 0800  BP: (!) 150/81 (!) 146/84 (!) 146/84 (!) 161/87  Pulse: (!) 133 (!) 131 (!) 128 (!) 134  Resp: 18 16 15 14   Temp:      TempSrc:      SpO2: 97% 99% 99% 98%    Constitutional: NAD, alert and oriented x 3 Eyes: PERRL, lids and conjunctivae pallor ENMT: Mucous membranes are dry  Neck: normal, supple, no masses, no thyromegaly Respiratory: clear to auscultation bilaterally, no wheezing, no crackles. Normal respiratory effort. No accessory muscle use.  Cardiovascular: Tachycardic, no murmurs / rubs / gallops. No  extremity edema. 2+ pedal pulses. No carotid bruits.  Abdomen: no tenderness, no masses palpated. No hepatosplenomegaly. Bowel sounds positive.  Musculoskeletal: no clubbing / cyanosis. No joint deformity upper and lower extremities.  Generalized tremors Skin: Ecchymosis on forearms, macular rash over anterior chest wall (POA) Neurologic: No gross focal neurologic deficit. Psychiatric: Flat affect.   Labs on Admission: I have personally reviewed following labs and imaging studies  CBC: Recent Labs  Lab 12/22/19 0059  WBC 11.1*  NEUTROABS 9.2*  HGB 13.7  HCT 44.1  MCV 100.2*  PLT 99*   Basic Metabolic Panel: Recent Labs  Lab 12/22/19 0107  NA 139  K 4.5  CL 103  CO2 8*  GLUCOSE 89  BUN 22  CREATININE 1.08  CALCIUM 7.7*   GFR: CrCl cannot be calculated (Unknown ideal  weight.). Liver Function Tests: Recent Labs  Lab 12/22/19 0107  AST 69*  ALT 51*  ALKPHOS 82  BILITOT 0.8  PROT 7.2  ALBUMIN 3.9   No results for input(s): LIPASE, AMYLASE in the last 168 hours. No results for input(s): AMMONIA in the last 168 hours. Coagulation Profile: No results for input(s): INR, PROTIME in the last 168 hours. Cardiac Enzymes: No results for input(s): CKTOTAL, CKMB, CKMBINDEX, TROPONINI in the last 168 hours. BNP (last 3 results) No results for input(s): PROBNP in the last 8760 hours. HbA1C: No results for input(s): HGBA1C in the last 72 hours. CBG: No results for input(s): GLUCAP in the last 168 hours. Lipid Profile: No results for input(s): CHOL, HDL, LDLCALC, TRIG, CHOLHDL, LDLDIRECT in the last 72 hours. Thyroid Function Tests: No results for input(s): TSH, T4TOTAL, FREET4, T3FREE, THYROIDAB in the last 72 hours. Anemia Panel: No results for input(s): VITAMINB12, FOLATE, FERRITIN, TIBC, IRON, RETICCTPCT in the last 72 hours. Urine analysis:    Component Value Date/Time   COLORURINE YELLOW 12/22/2019 0626   APPEARANCEUR CLEAR 12/22/2019 0626   LABSPEC 1.017  12/22/2019 0626   PHURINE 5.0 12/22/2019 0626   GLUCOSEU NEGATIVE 12/22/2019 0626   HGBUR SMALL (A) 12/22/2019 0626   BILIRUBINUR NEGATIVE 12/22/2019 0626   KETONESUR 80 (A) 12/22/2019 0626   PROTEINUR 100 (A) 12/22/2019 0626   NITRITE NEGATIVE 12/22/2019 0626   LEUKOCYTESUR NEGATIVE 12/22/2019 0626    Radiological Exams on Admission: No results found.  EKG: Independently reviewed.  Sinus tachycardia  Assessment/Plan Principal Problem:   Alcohol withdrawal (HCC) Active Problems:   GERD (gastroesophageal reflux disease)   Essential hypertension   Alcoholic ketoacidosis   Thrombocytopenia (HCC)     Alcohol withdrawal Patient has a history of alcoholism and has been binge drinking Last drink was around midnight prior to his admission He presents for evaluation of nausea and weakness and noted to have generalized tremors and tachycardia consistent with alcohol withdrawal We will place patient on lorazepam per alcohol withdrawal protocol and administer for CIWA score of 8 or greater We will place patient on scheduled clonidine    Alcoholic ketoacidosis Secondary to poor oral intake Patient noted to have a serum bicarbonate level of 8 and an anion gap of 28 Continue IV fluid hydration with banana bag Repeat electrolytes   Hypertension Uncontrolled We will place patient on scheduled clonidine to suppress sympathetic overdrive   GERD Place patient on IV PPI   Thrombocytopenia Secondary to alcohol abuse Patient has ecchymosis on his skin but denies any fall or trauma Monitor closely for bleeding  DVT prophylaxis: SCD Code Status: Full code Family Communication: Greater than 50% of time was spent discussing plan of care with patient at the bedside.  He verbalizes understanding and agrees with the plan. Disposition Plan: Back to previous home environment Consults called: None    Brecklynn Jian MD [facility]     12/22/2019, 8:36 AM

## 2019-12-22 NOTE — ED Provider Notes (Addendum)
[facility] DEPT Provider Note: Georgena Spurling, MD, FACEP  CSN: [id number] MRN: [id number] ARRIVAL: 12/22/19 at [place]: [place]  12/22/19 1:15 AM Ruben [name] is a 69 y.o. male with a history of alcoholism.  He was admitted in June of this year for acute kidney injury in the setting of alcohol withdrawal.  He admits he has been binge drinking this weekend.  He called EMS because he has noted some scaling skin on his fingers and toes which he recognized from his previous hospitalization.  He is concerned he may be in renal failure again.  EMS found him to be tachycardic into the 160s (sinus) and gave him a 1 L normal saline bolus.  He was also given Zofran for nausea.  He denies any pain or shortness of breath.  EMS noted him to be lethargic but oriented.  The skin flaking is mild.   Past Medical History:  Diagnosis Date  . Alcoholism ([place])   . Aortic atherosclerosis ([place]) 12/26/2016   Noted on CT  . Arthritis   . BPH (benign prostatic hyperplasia)   . Chronic constipation    takes magnesium  . Duodenal ulcer   . Dyspnea 12/17/2017  . ED (erectile dysfunction)   . Foley catheter in place    history of no longer in place  . GERD (gastroesophageal reflux disease)   . Heart palpitations    Occ  . Hiatal hernia   . History of adenomatous polyp of colon    tubular adenoma's 2005  . History of Helicobacter pylori infection    2008  . Hypercholesteremia   . Hypertension   . MR (mitral regurgitation)    Mild, noted on ECHO  . Pre-diabetes   . Pulmonary nodule 12/26/2016   noted on CT, 5 x 3 mm nodular opacity right upper lobe  . Rosacea   . Schatzki's ring    last dilated 02/ 2016  . Urinary retention    history of  . Varicocele 02/25/2014   Small to moderate left varicocele    Past Surgical History:  Procedure Laterality Date  . BALLOON DILATION N/A 06/08/2014   Procedure: BALLOON DILATION;   Surgeon: Garlan Fair, MD;  Location: [facility];  Service: Endoscopy;  Laterality: N/A;  . COLONOSCOPY WITH PROPOFOL N/A 06/08/2014   Procedure: COLONOSCOPY WITH PROPOFOL;  Surgeon: Garlan Fair, MD;  [facility]: [facility];  Service: Endoscopy;  Laterality: N/A;  . ESOPHAGOGASTRODUODENOSCOPY N/A 06/08/2014   Procedure: ESOPHAGOGASTRODUODENOSCOPY (EGD);  Surgeon: Garlan Fair, MD;  Location: [facility];  Service: Endoscopy;  Laterality: N/A;  . ESOPHAGOGASTRODUODENOSCOPY (EGD) WITH ESOPHAGEAL DILATION N/A 12/31/2012   Procedure: ESOPHAGOGASTRODUODENOSCOPY (EGD) WITH ESOPHAGEAL DILATION;  Surgeon: Garlan Fair, MD;  Location: [facility];  Service: Endoscopy;  Laterality: N/A;  . GREEN LIGHT LASER TURP (TRANSURETHRAL RESECTION OF PROSTATE N/A 12/28/2015   Procedure: GREEN LIGHT LASER TURP (TRANSURETHRAL RESECTION OF PROSTATE;  Surgeon: Festus Aloe, MD;  Location: [facility];  Service: Urology;  Laterality: N/A;    Family History  Problem Relation Age of Onset  . Heart failure Mother   . Stroke Father     Social History   Tobacco Use  . Smoking status: Never Smoker  . Smokeless tobacco: Never Used  Vaping Use  . Vaping Use: Never used  Substance Use Topics  . Alcohol use: Yes  . Drug use: No    Prior to  Admission medications   Medication Sig Start Date End Date Taking? Authorizing Provider  atorvastatin (LIPITOR) 20 MG tablet Take 1 tablet (20 mg total) by mouth daily. 10/06/19 11/05/19  Alma Friendly, MD  docusate sodium (COLACE) 100 MG capsule Take 1 capsule (100 mg total) by mouth 2 (two) times daily. Ok to stop taking if having loose bowel movements Patient not taking: Reported on 09/01/2019 04/28/18   Clovis Riley, MD  folic acid (FOLVITE) 1 MG tablet Take 1 tablet (1 mg total) by mouth daily. 06/28/19   Mariel Aloe, MD  metoprolol tartrate (LOPRESSOR) 25 MG tablet Take 1 tablet (25 mg total) by mouth 2 (two) times daily. 10/06/19  11/05/19  Alma Friendly, MD  Multiple Vitamin (MULTIVITAMIN WITH MINERALS) TABS tablet Take 1 tablet by mouth daily. 06/28/19   Mariel Aloe, MD  thiamine 100 MG tablet Take 1 tablet (100 mg total) by mouth daily. Patient not taking: Reported on 09/01/2019 06/28/19   Mariel Aloe, MD  traZODone (DESYREL) 50 MG tablet Take 1 tablet (50 mg total) by mouth at bedtime as needed for sleep. 10/06/19 11/05/19  Alma Friendly, MD    Allergies Patient has no known allergies.   REVIEW OF SYSTEMS  Negative except as noted here or in the History of Present Illness.   PHYSICAL EXAMINATION  Initial Vital Signs Blood pressure (!) 147/84, pulse (!) 141, temperature 98.4 F (36.9 C), temperature source Oral, resp. rate 15, SpO2 99 %.  Examination General: Well-developed, well-nourished male in no acute distress; appearance consistent with age of record HENT: normocephalic; atraumatic Eyes: pupils equal, round and reactive to light; extraocular muscles intact Neck: supple Heart: regular rate and rhythm Lungs: clear to auscultation bilaterally Abdomen: soft; nondistended; nontender; no masses or hepatosplenomegaly; bowel sounds present Extremities: No deformity; full range of motion; pulses normal Neurologic: Awake, lethargic but oriented; motor function intact in all extremities and symmetric; no facial droop Skin: Warm and dry; a few scattered petechiae of lower extremities; hematoma dorsal right hand at site of EMS IV attempt; healing ecchymosis of dorsal left hand at site of previous IV; minimal scaling of skin of hands and feet Psychiatric: Flat affect   RESULTS  Summary of this visit's results, reviewed and interpreted by myself:   EKG Interpretation  Date/Time:  Monday December 22 2019 01:23:47 EDT Ventricular Rate:  165 PR Interval:    QRS Duration: 99 QT Interval:  332 QTC Calculation: 551 R Axis:   151 Text Interpretation: Sinus Tachycardia Right axis deviation  Confirmed by Vicke Plotner [phone]) on 12/22/2019 2:06:19 AM      Laboratory Studies: Results for orders placed or performed during the hospital encounter of 12/22/19 (from the past 24 hour(s))  CBC with Differential/Platelet     Status: Abnormal   Collection Time: 12/22/19 12:59 AM  Result Value Ref Range   WBC 11.1 (H) 4.0 - 10.5 K/uL   RBC 4.40 4.22 - 5.81 MIL/uL   Hemoglobin 13.7 13.0 - 17.0 g/dL   HCT 44.1 39 - 52 %   MCV 100.2 (H) 80.0 - 100.0 fL   MCH 31.1 26.0 - 34.0 pg   MCHC 31.1 30.0 - 36.0 g/dL   RDW 13.7 11.5 - 15.5 %   Platelets 99 (L) 150 - 400 K/uL   nRBC 0.0 0.0 - 0.2 %   Neutrophils Relative % 82 %   Neutro Abs 9.2 (H) 1.7 - 7.7 K/uL   Lymphocytes Relative 10 %  Lymphs Abs 1.1 0.7 - 4.0 K/uL   Monocytes Relative 6 %   Monocytes Absolute 0.7 0 - 1 K/uL   Eosinophils Relative 0 %   Eosinophils Absolute 0.0 0 - 0 K/uL   Basophils Relative 1 %   Basophils Absolute 0.1 0 - 0 K/uL   Immature Granulocytes 1 %   Abs Immature Granulocytes 0.05 0.00 - 0.07 K/uL  Comprehensive metabolic panel     Status: Abnormal   Collection Time: 12/22/19  1:07 AM  Result Value Ref Range   Sodium 139 135 - 145 mmol/L   Potassium 4.5 3.5 - 5.1 mmol/L   Chloride 103 98 - 111 mmol/L   CO2 8 (L) 22 - 32 mmol/L   Glucose, Bld 89 70 - 99 mg/dL   BUN 22 8 - 23 mg/dL   Creatinine, Ser 1.08 0.61 - 1.24 mg/dL   Calcium 7.7 (L) 8.9 - 10.3 mg/dL   Total Protein 7.2 6.5 - 8.1 g/dL   Albumin 3.9 3.5 - 5.0 g/dL   AST 69 (H) 15 - 41 U/L   ALT 51 (H) 0 - 44 U/L   Alkaline Phosphatase 82 38 - 126 U/L   Total Bilirubin 0.8 0.3 - 1.2 mg/dL   GFR calc non Af Amer >60 >60 mL/min   GFR calc Af Amer >60 >60 mL/min   Anion gap 28 (H) 5 - 15  Ethanol     Status: Abnormal   Collection Time: 12/22/19  2:45 AM  Result Value Ref Range   Alcohol, Ethyl (B) 188 (H) <10 mg/dL  Blood gas, venous (at [facility] and [facility], not at [facility])     Status: Abnormal   Collection Time: 12/22/19  4:20 AM  Result Value Ref Range    pH, Ven 7.087 (LL) 7.25 - 7.43   pCO2, Ven 26.8 (L) 44 - 60 mmHg   pO2, Ven 64.3 (H) 32 - 45 mmHg   Bicarbonate 7.7 (L) 20.0 - 28.0 mmol/L   Acid-base deficit 21.5 (H) 0.0 - 2.0 mmol/L   O2 Saturation 83.4 %   Patient temperature 98.6    Imaging Studies: No results found.  ED COURSE and MDM  Nursing notes, initial and subsequent vitals signs, including pulse oximetry, reviewed and interpreted by myself.  Vitals:   12/22/19 0112 12/22/19 0200 12/22/19 0400 12/22/19 0603  BP: (!) 147/84 138/84 129/79 (!) 150/81  Pulse: (!) 141 (!) 136 (!) 138 (!) 133  Resp: 15 17 16 18   Temp: 98.4 F (36.9 C)     TempSrc: Oral     SpO2: 99% 100% 97% 97%   Medications  LORazepam (ATIVAN) injection 0-4 mg ( Intravenous See Alternative 12/22/19 0627)    Or  LORazepam (ATIVAN) tablet 0-4 mg (1 mg Oral Given 12/22/19 0627)  dextrose 5 % in lactated ringers infusion ( Intravenous New Bag/Given 12/22/19 0628)  sodium chloride 0.9 % 1,000 mL with thiamine 423 mg, folic acid 1 mg, multivitamins adult 10 mL infusion ( Intravenous New Bag/Given 12/22/19 0223)   3:01 AM Patient given IV fluids with vitamins due to tachycardia in the setting of alcoholism.  His laboratories studies show an increased anion gap with a decreased CO2.  We will check a blood gas as he could be in alcoholic ketoacidosis.   5:07 AM Patient complaining of feeling agitated, nurse reports patient CIWA score is 10.  Ativan/CIWA alcohol withdrawal protocol initiated.  Patient's blood gas shows a significant acidosis.  Patient states he has not had any  thing to eat for 10 days.  I suspect alcoholic ketoacidosis.  Urinalysis is pending but urine ketones can be falsely negative.  Will start on the D5 LR in addition to his current normal saline.   6:27 AM Hospitalist to admit.   PROCEDURES  Procedures  CRITICAL CARE Performed by: Karen Chafe Shacara Cozine Total critical care time: 30 minutes Critical care time was exclusive of separately billable  procedures and treating other patients. Critical care was necessary to treat or prevent imminent or life-threatening deterioration. Critical care was time spent personally by me on the following activities: development of treatment plan with patient and/or surrogate as well as nursing, discussions with consultants, evaluation of patient's response to treatment, examination of patient, obtaining history from patient or surrogate, ordering and performing treatments and interventions, ordering and review of laboratory studies, ordering and review of radiographic studies, pulse oximetry and re-evaluation of patient's condition.   ED DIAGNOSES     ICD-10-CM   1. Alcoholic ketoacidosis  T59.7   2. Alcoholic intoxication with complication ([place])  C16.384   3. Thrombocytopenia ([place])  D69.6   4. Alcohol withdrawal, with unspecified complication ([place])  T36.468        Koi Zangara, Jenny Reichmann, MD 12/22/19 0321    Shanon Rosser, MD 12/22/19 [phone]

## 2019-12-22 NOTE — ED Notes (Signed)
Date and time results received: 12/22/19 0441 (use smartphrase ".now" to insert current time)  Test: [facility] 7.087 Critical Value: 7.087  Name of Provider Notified: Dr. Florina Ou  Orders Received? Or Actions Taken?: Actions Taken: Provider notified

## 2019-12-22 NOTE — Plan of Care (Signed)
69 y/o M with h/o HTN, dyslipidemia and Etoh abuse p/w flaking off skin.   Etoh 188 VBG  With 7.087 with bicarb of 7  Treating with d5w.  Also in ETOh withdrawal.

## 2019-12-23 ENCOUNTER — Inpatient Hospital Stay (HOSPITAL_COMMUNITY): Payer: Medicare Other

## 2019-12-23 LAB — CBC
HCT: 33.3 % — ABNORMAL LOW (ref 39.0–52.0)
Hemoglobin: 11.4 g/dL — ABNORMAL LOW (ref 13.0–17.0)
MCH: 31.8 pg (ref 26.0–34.0)
MCHC: 34.2 g/dL (ref 30.0–36.0)
MCV: 93 fL (ref 80.0–100.0)
Platelets: 49 10*3/uL — ABNORMAL LOW (ref 150–400)
RBC: 3.58 MIL/uL — ABNORMAL LOW (ref 4.22–5.81)
RDW: 13.4 % (ref 11.5–15.5)
WBC: 4.9 10*3/uL (ref 4.0–10.5)
nRBC: 0 % (ref 0.0–0.2)

## 2019-12-23 LAB — BASIC METABOLIC PANEL
Anion gap: 13 (ref 5–15)
BUN: 28 mg/dL — ABNORMAL HIGH (ref 8–23)
CO2: 20 mmol/L — ABNORMAL LOW (ref 22–32)
Calcium: 8.1 mg/dL — ABNORMAL LOW (ref 8.9–10.3)
Chloride: 98 mmol/L (ref 98–111)
Creatinine, Ser: 1.42 mg/dL — ABNORMAL HIGH (ref 0.61–1.24)
GFR calc Af Amer: 58 mL/min — ABNORMAL LOW (ref >60)
GFR calc non Af Amer: 50 mL/min — ABNORMAL LOW (ref >60)
Glucose, Bld: 161 mg/dL — ABNORMAL HIGH (ref 70–99)
Potassium: 3.1 mmol/L — ABNORMAL LOW (ref 3.5–5.1)
Sodium: 131 mmol/L — ABNORMAL LOW (ref 135–145)

## 2019-12-23 MED ORDER — PROSOURCE PLUS PO LIQD
30.0000 mL | Freq: Two times a day (BID) | ORAL | Status: DC
  Administered 2019-12-23 – 2019-12-30 (×14): 30 mL via ORAL
  Filled 2019-12-23 (×14): qty 30

## 2019-12-23 MED ORDER — METOPROLOL TARTRATE 25 MG PO TABS
25.0000 mg | ORAL_TABLET | Freq: Two times a day (BID) | ORAL | Status: DC
  Administered 2019-12-23 – 2019-12-30 (×15): 25 mg via ORAL
  Filled 2019-12-23 (×15): qty 1

## 2019-12-23 MED ORDER — CHLORDIAZEPOXIDE HCL 25 MG PO CAPS
25.0000 mg | ORAL_CAPSULE | Freq: Once | ORAL | Status: AC
  Administered 2019-12-23: 25 mg via ORAL
  Filled 2019-12-23: qty 1

## 2019-12-23 MED ORDER — POTASSIUM CHLORIDE 2 MEQ/ML IV SOLN
INTRAVENOUS | Status: DC
  Filled 2019-12-23 (×4): qty 1000

## 2019-12-23 MED ORDER — POTASSIUM CHLORIDE CRYS ER 20 MEQ PO TBCR
40.0000 meq | EXTENDED_RELEASE_TABLET | Freq: Every day | ORAL | Status: DC
  Administered 2019-12-23 – 2019-12-24 (×2): 40 meq via ORAL
  Filled 2019-12-23 (×2): qty 2

## 2019-12-23 MED ORDER — ADULT MULTIVITAMIN W/MINERALS CH
1.0000 | ORAL_TABLET | Freq: Every day | ORAL | Status: DC
  Administered 2019-12-23 – 2019-12-30 (×8): 1 via ORAL
  Filled 2019-12-23 (×8): qty 1

## 2019-12-23 NOTE — Progress Notes (Signed)
Initial Nutrition Assessment  RD working remotely.  DOCUMENTATION CODES:   Not applicable  INTERVENTION:  - will order 30 mL Prosource Plus BID, each supplement provides 100 kcal and 15 grams of protein. - will order 1 tablet multivitamin with minerals/day. - will complete NFPE at follow-up.   NUTRITION DIAGNOSIS:   Inadequate oral intake related to decreased appetite, nausea as evidenced by per patient/family report.  GOAL:   Patient will meet greater than or equal to 90% of their needs  MONITOR:   PO intake, Supplement acceptance, Labs, Weight trends  REASON FOR ASSESSMENT:   Malnutrition Screening Tool  ASSESSMENT:   69 y.o. male with medical history of alcohol abuse, aortic atherosclerosis, BPH, and GERD. He presented to the ED for evaluation of weakness, nausea, and poor appetite for several days. He reported binge drinking over the weekend (9/11 weekend). He then called EMS because he noted some scaling of the skin around his fingers and toes. Patient drinks 12-15 oz of liquor/day.  No intakes documented since admission. Weight yesterday was 180 lb and weight has been stable for the past 4 months. Weight on 06/27/19 was 191 lb which indicates 11 lb weight loss (5.7% body weight) in the past 6 months; not significant for time frame.   Notes indicate that on admission patient reported that he had been binge drinking and not eating d/t this. His last drink was at midnight on 9/12 into 9/13.    Labs reviewed; Na: 131 mmol/l, K: 3.1 mmol/l, BUN: 28 mg/dl, creatinine: 1.42 mg/dl, Ca: 8.1 mg/dl, GFR: 50 ml/min.  Medications reviewed; 40 mg IV protonix/day, 40 mEq Klor-Con/day.  IVF; banana bag given x1; LR-20 mEq KCl @ 75 ml/hr.    NUTRITION - FOCUSED PHYSICAL EXAM:  unable to complete at this time.   Diet Order:   Diet Order            Diet 2 gram sodium Room service appropriate? Yes; Fluid consistency: Thin  Diet effective now                 EDUCATION NEEDS:    No education needs have been identified at this time  Skin:  Skin Assessment: Reviewed RN Assessment  Last BM:  9/11  Height:   Ht Readings from Last 1 Encounters:  12/22/19 5\' 10"  (1.778 m)    Weight:   Wt Readings from Last 1 Encounters:  12/22/19 81.7 kg     Estimated Nutritional Needs:  Kcal:  1880-2045 kcal Protein:  80-90 grams Fluid:  >/= 2.2 L/day      Jarome Matin, MS, RD, LDN, CNSC Inpatient Clinical Dietitian RD pager # available in [facility]  After hours/weekend pager # available in [facility]

## 2019-12-23 NOTE — Progress Notes (Signed)
PROGRESS NOTE    Ruben [name]  GHW:[id number] DOB: February 27, 1951 DOA: 12/22/2019 PCP: [name], MD  Brief Narrative:  69 year old white male Ethanolism for many years (drinks 12 to 15 ounces of liquor daily-last drink reported 12/21/2019) with multiple visits to behavioral health? Admission for the same with deranged electrolytes secondary to the same HTN B12 deficiency BPH followed by urology Prior paraesophageal hernia status post Nissen fundoplication [id number]/8936 previously on pured diet  Recent admission 09/2019 EtOH/AKI  Came to [facility] 9/13 secondary to binge drinking over the past weekend-in ED found to be sinus tachycardic 160s-was agitated-and unable to keep down p.o. Had tremors consistent with EtOH withdrawal Bicarb found to be 8 on admission, also had AKI Admitted for EtOH withdrawal, AKI, alcoholic ketoacidosis  Assessment & Plan:   Principal Problem:   Alcohol withdrawal ([place]) Active Problems:   GERD (gastroesophageal reflux disease)   Essential hypertension   Alcoholic ketoacidosis   Thrombocytopenia ([place])   1. Nausea vomiting secondary to probable alcoholic gastritis in the setting of prior nisin fundoplication in 1017 a. Tolerating diet well this morning had a [place] sandwich and on that and taking Ensure now b. Continue current diet 2. Diplopia a. Unlikely that he has had a stroke given quick improvement of the same b. CT done 09/2019 was negative for stroke but showed microvascular insults c. If persisting concerns and not improved by 9/15 get MRI brain rule out stroke 3. AKI, /alcoholic acidosis on admission without concern for sepsis a. Anion gap is resolved-was 28 on admission, AKI slowly improving Baseline creatinine is 1.1 b. Volume replete LR +20 of K 75 cc/H in addition to potassium 40 mEq daily c. Repeat labs as below 4. Chronic ethanolism with alcohol liver disease and thrombocytopenia a. Withdrawal scale is at 1 or 2 b. Add Librium for more  sustained duration x125 mg c. Continue clonidine 0.1 twice daily in addition for withdrawal 5. Sinus tachycardia  a. Secondary to withdrawal of prior to admission metoprolol 25 twice daily-we added that back 6. elevated blood sugar without a diagnosis of diabetes mellitus a. Will need outpatient check of the same b. No further work-up inpatient 7. Deranged electrolytes as below a. Hyponatremia secondary to excess fluid in the setting of potomania b. Replacing hypokalemia IV and p.o. as above 8. Leukocytosis on admission now resolved without intervention 9. Macrocytic anemia secondary to above with thrombocytopenia likely related to volume resuscitation with D5 a. Outpatient check B12 folate etc. b. Needs to stop drinking c. Stop heparin, consider HIT and place on SCDs and check HIT panel and serotonin assay if continues to drop  DVT prophylaxis: Lovenox Code Status: Full Family Communication: None Disposition:   Status is: Inpatient  Remains inpatient appropriate because:Hemodynamically unstable, Persistent severe electrolyte disturbances, Ongoing diagnostic testing needed not appropriate for outpatient work up and Unsafe d/c plan   Dispo: The patient is from: Home              Anticipated d/c is to: Unclear at this time needs therapy to evaluate              Anticipated d/c date is: 2 days              Patient currently is not medically stable to d/c.       Consultants:   None  Procedures: None  Antimicrobials: None   Subjective: Awake alert coherent very slow speech tells me he is having diplopia which is getting better  Has had this numerous times when he is admitted for DKA so this is not unusual Eating and drinking some but not as hungry as usual No chest pain no fever  Objective: Vitals:   12/22/19 2100 12/23/19 0200 12/23/19 0203 12/23/19 0502  BP: 135/75 118/74 118/74 116/68  Pulse:  99 99 (!) 107  Resp:    16  Temp:    98.2 F (36.8 C)  TempSrc:     Oral  SpO2:   100% 98%  Weight:      Height:        Intake/Output Summary (Last 24 hours) at 12/23/2019 0742 Last data filed at 12/23/2019 0503 Gross per 24 hour  Intake 2532.97 ml  Output 1250 ml  Net 1282.97 ml   Filed Weights   12/22/19 1038  Weight: 81.7 kg    Examination: Coherent pleasant very slow speech General exam: Awake alert no icterus no pallor Respiratory system: Clear no added sound no rales no rhonchi Cardiovascular system: S1-S2 sinus tachycardia 120s Gastrointestinal system: Soft no rebound no guarding. Central nervous system: Finger-nose-finger is a little impaired Vision by direct confrontation is normal when I examined him Power is 5/5 bilaterally to biceps triceps forearm musculature Able to straight leg raise off the bed Smile symmetric Sensory is grossly intact to lower and upper appendages Extremities: ROM intact Skin: Soft no edema Psychiatry: Flat affect euthymic  Data Reviewed: I have personally reviewed following labs and imaging studies Sodium 139-->131 Potassium 4.5- and 3.1 CO2 8-->20 Anion gap 28-->13 White count 11.1-->4.9 Hemoglobin 13.7->11.4 Platelet 99->49  Radiology Studies: No results found.   Scheduled Meds: . atorvastatin  20 mg Oral Daily  . cloNIDine  0.1 mg Oral BID  . feeding supplement (ENSURE ENLIVE)  237 mL Oral BID BM  . LORazepam  0-4 mg Intravenous Q6H   Followed by  . [START ON 12/24/2019] LORazepam  0-4 mg Intravenous Q12H  . pantoprazole (PROTONIX) IV  40 mg Intravenous Q24H   Continuous Infusions:   LOS: 1 day    Time spent: [place], MD [facility] To contact the attending provider between 7A-7P or the covering provider during after hours 7P-7A, please log into the web site [email or web address] and access using universal [place] password for that web site. If you do not have the password, please call the hospital operator.  12/23/2019, 7:42 AM

## 2019-12-24 DIAGNOSIS — R5381 Other malaise: Secondary | ICD-10-CM

## 2019-12-24 DIAGNOSIS — D61818 Other pancytopenia: Secondary | ICD-10-CM

## 2019-12-24 LAB — CBC WITH DIFFERENTIAL/PLATELET
Abs Immature Granulocytes: 0.01 10*3/uL (ref 0.00–0.07)
Basophils Absolute: 0 10*3/uL (ref 0.0–0.1)
Basophils Relative: 1 %
Eosinophils Absolute: 0.2 10*3/uL (ref 0.0–0.5)
Eosinophils Relative: 6 %
HCT: 33.7 % — ABNORMAL LOW (ref 39.0–52.0)
Hemoglobin: 11.2 g/dL — ABNORMAL LOW (ref 13.0–17.0)
Immature Granulocytes: 0 %
Lymphocytes Relative: 25 %
Lymphs Abs: 0.9 10*3/uL (ref 0.7–4.0)
MCH: 31.1 pg (ref 26.0–34.0)
MCHC: 33.2 g/dL (ref 30.0–36.0)
MCV: 93.6 fL (ref 80.0–100.0)
Monocytes Absolute: 0.4 10*3/uL (ref 0.1–1.0)
Monocytes Relative: 11 %
Neutro Abs: 1.9 10*3/uL (ref 1.7–7.7)
Neutrophils Relative %: 57 %
Platelets: 42 10*3/uL — ABNORMAL LOW (ref 150–400)
RBC: 3.6 MIL/uL — ABNORMAL LOW (ref 4.22–5.81)
RDW: 13.5 % (ref 11.5–15.5)
WBC: 3.5 10*3/uL — ABNORMAL LOW (ref 4.0–10.5)
nRBC: 0 % (ref 0.0–0.2)

## 2019-12-24 LAB — COMPREHENSIVE METABOLIC PANEL
ALT: 42 U/L (ref 0–44)
AST: 53 U/L — ABNORMAL HIGH (ref 15–41)
Albumin: 3.1 g/dL — ABNORMAL LOW (ref 3.5–5.0)
Alkaline Phosphatase: 72 U/L (ref 38–126)
Anion gap: 12 (ref 5–15)
BUN: 23 mg/dL (ref 8–23)
CO2: 21 mmol/L — ABNORMAL LOW (ref 22–32)
Calcium: 8.7 mg/dL — ABNORMAL LOW (ref 8.9–10.3)
Chloride: 101 mmol/L (ref 98–111)
Creatinine, Ser: 0.97 mg/dL (ref 0.61–1.24)
GFR calc Af Amer: >60 mL/min (ref >60)
GFR calc non Af Amer: >60 mL/min (ref >60)
Glucose, Bld: 127 mg/dL — ABNORMAL HIGH (ref 70–99)
Potassium: 3.2 mmol/L — ABNORMAL LOW (ref 3.5–5.1)
Sodium: 134 mmol/L — ABNORMAL LOW (ref 135–145)
Total Bilirubin: 0.7 mg/dL (ref 0.3–1.2)
Total Protein: 5.7 g/dL — ABNORMAL LOW (ref 6.5–8.1)

## 2019-12-24 MED ORDER — PANTOPRAZOLE SODIUM 40 MG PO TBEC
40.0000 mg | DELAYED_RELEASE_TABLET | Freq: Every day | ORAL | Status: DC
  Administered 2019-12-24 – 2019-12-30 (×7): 40 mg via ORAL
  Filled 2019-12-24 (×7): qty 1

## 2019-12-24 MED ORDER — POTASSIUM CHLORIDE CRYS ER 20 MEQ PO TBCR
40.0000 meq | EXTENDED_RELEASE_TABLET | Freq: Once | ORAL | Status: AC
  Administered 2019-12-24: 40 meq via ORAL
  Filled 2019-12-24: qty 2

## 2019-12-24 NOTE — Progress Notes (Signed)
PROGRESS NOTE  HAVOC SANLUIS ZHG:[id number] DOB: [birth year]   PCP: [name], MD  Patient is from: Home.  Lives alone.  Has walker at home but does not use.  DOA: 12/22/2019 LOS: 2  Brief Narrative / Interim history: 69 year old man with history of alcohol abuse, BPH, HTN and B12 deficiency presented to the hospital with alcohol withdrawal symptoms.  Reportedly binge drinking prior to presentation.   He was admitted for alcohol withdrawal, metabolic acidosis and AKI.   Subjective: Seen and examined earlier this morning.  No major events overnight of this morning.  Reports feeling anxious but denies audiovisual hallucination.  Diplopia resolved.  He denies any visual issues right now.  Denies chest pain, dyspnea, vomiting or abdominal pain.  Reports nausea.  Objective: Vitals:   12/24/19 0225 12/24/19 0638 12/24/19 0801 12/24/19 1214  BP: 118/80 (!) 136/95 134/85 117/88  Pulse: 77 96 79 88  Resp: 20 18 19 18   Temp: 98.5 F (36.9 C) 98.3 F (36.8 C) 98.2 F (36.8 C) 98.4 F (36.9 C)  TempSrc: Oral Oral Oral Oral  SpO2: 97% 99% 99% 97%  Weight: 83.3 kg     Height:        Intake/Output Summary (Last 24 hours) at 12/24/2019 1515 Last data filed at 12/24/2019 0900 Gross per 24 hour  Intake 2134.51 ml  Output 1100 ml  Net 1034.51 ml   Filed Weights   12/22/19 1038 12/24/19 0225  Weight: 81.7 kg 83.3 kg    Examination:  GENERAL: No apparent distress.  Nontoxic. HEENT: MMM.  Vision and hearing grossly intact.  PERRL.  EOMI. NECK: Supple.  No apparent JVD.  RESP:  No IWOB.  Fair aeration bilaterally. CVS:  RRR. Heart sounds normal.  ABD/GI/GU: BS+. Abd soft, NTND.  MSK/EXT:  Moves extremities. No apparent deformity. No edema.  SKIN: no apparent skin lesion or wound NEURO: Awake, alert and oriented fairly..  No apparent focal neuro deficit. PSYCH: Calm. Normal affect.  Procedures:  None  Microbiology summarized: COVID-19 PCR negative.  Assessment &  Plan: Alcohol abuse/alcohol withdrawal-reportedly binge drinking prior to presentation.  CIWA scoring is from 2-15. -Continue Librium taper with as needed Ativan. -Continue Multivite, folic acid and thiamine  Anion gap metabolic acidosis: Likely due to alcohol.  Resolved.  Diplopia: Resolved.  PERRL and EOMI on exam.  Hyperglycemia without diagnosis of diabetes: Likely due to alcohol. -Check hemoglobin A1c  Leukocytosis: Resolved  Sinus tachycardia/HTN: Resolved. -Continue clonidine -Continue metoprolol  Hypokalemia: Likely due to alcohol. -Replenish and recheck  Mild hyponatremia: could be from LR or alcohol.  Improved. -Discontinue LR  Pancytopenia: Likely from alcohol.  Debility: -PT/OT  Body mass index is 26.35 kg/m. Nutrition Problem: Inadequate oral intake Etiology: decreased appetite, nausea Signs/Symptoms: per patient/family report Interventions: MVI, Other (Comment) (Prosource Plus)   DVT prophylaxis:  SCDs Start: 12/22/19 0752  Code Status: Full code Family Communication: Patient states no family member for update. Status is: Inpatient  Remains inpatient appropriate because:Persistent severe electrolyte disturbances, IV treatments appropriate due to intensity of illness or inability to take PO and Inpatient level of care appropriate due to severity of illness   Dispo: The patient is from: Home              Anticipated d/c is to: SNF              Anticipated d/c date is: 2 days              Patient currently is  not medically stable to d/c.       Consultants:  None   Sch Meds:  Scheduled Meds: . (feeding supplement) PROSource Plus  30 mL Oral BID BM  . atorvastatin  20 mg Oral Daily  . cloNIDine  0.1 mg Oral BID  . feeding supplement (ENSURE ENLIVE)  237 mL Oral BID BM  . LORazepam  0-4 mg Intravenous Q12H  . metoprolol tartrate  25 mg Oral BID  . multivitamin with minerals  1 tablet Oral Daily  . pantoprazole  40 mg Oral Daily  .  potassium chloride  40 mEq Oral Daily   Continuous Infusions: . lactated ringers 1,000 mL with potassium chloride 20 mEq infusion 75 mL/hr at 12/24/19 0449   PRN Meds:.acetaminophen, LORazepam **OR** LORazepam, ondansetron **OR** ondansetron (ZOFRAN) IV, traZODone  Antimicrobials: Anti-infectives (From admission, onward)   None       I have personally reviewed the following labs and images: CBC: Recent Labs  Lab 12/22/19 0059 12/23/19 0514 12/24/19 0531  WBC 11.1* 4.9 3.5*  NEUTROABS 9.2*  --  1.9  HGB 13.7 11.4* 11.2*  HCT 44.1 33.3* 33.7*  MCV 100.2* 93.0 93.6  PLT 99* 49* 42*   BMP &GFR Recent Labs  Lab 12/22/19 0107 12/22/19 1517 12/23/19 0514 12/24/19 0531  NA 139 132* 131* 134*  K 4.5 4.6 3.1* 3.2*  CL 103 99 98 101  CO2 8* 12* 20* 21*  GLUCOSE 89 267* 161* 127*  BUN 22 32* 28* 23  CREATININE 1.08 1.56* 1.42* 0.97  CALCIUM 7.7* 7.6* 8.1* 8.7*   Estimated Creatinine Clearance: 75.3 mL/min (by C-G formula based on SCr of 0.97 mg/dL). Liver & Pancreas: Recent Labs  Lab 12/22/19 0107 12/24/19 0531  AST 69* 53*  ALT 51* 42  ALKPHOS 82 72  BILITOT 0.8 0.7  PROT 7.2 5.7*  ALBUMIN 3.9 3.1*   No results for input(s): LIPASE, AMYLASE in the last 168 hours. No results for input(s): AMMONIA in the last 168 hours. Diabetic: No results for input(s): HGBA1C in the last 72 hours. No results for input(s): GLUCAP in the last 168 hours. Cardiac Enzymes: No results for input(s): CKTOTAL, CKMB, CKMBINDEX, TROPONINI in the last 168 hours. No results for input(s): PROBNP in the last 8760 hours. Coagulation Profile: No results for input(s): INR, PROTIME in the last 168 hours. Thyroid Function Tests: Recent Labs    12/22/19 0846  TSH 0.907   Lipid Profile: No results for input(s): CHOL, HDL, LDLCALC, TRIG, CHOLHDL, LDLDIRECT in the last 72 hours. Anemia Panel: No results for input(s): VITAMINB12, FOLATE, FERRITIN, TIBC, IRON, RETICCTPCT in the last 72  hours. Urine analysis:    Component Value Date/Time   COLORURINE YELLOW 12/22/2019 0626   APPEARANCEUR CLEAR 12/22/2019 0626   LABSPEC 1.017 12/22/2019 0626   PHURINE 5.0 12/22/2019 0626   GLUCOSEU NEGATIVE 12/22/2019 0626   HGBUR SMALL (A) 12/22/2019 0626   BILIRUBINUR NEGATIVE 12/22/2019 0626   KETONESUR 80 (A) 12/22/2019 0626   PROTEINUR 100 (A) 12/22/2019 0626   NITRITE NEGATIVE 12/22/2019 0626   LEUKOCYTESUR NEGATIVE 12/22/2019 0626   Sepsis Labs: Invalid input(s): PROCALCITONIN, [place]  Microbiology: Recent Results (from the past 240 hour(s))  SARS Coronavirus 2 by RT PCR (hospital order, performed in [facility] hospital lab) Nasopharyngeal Nasopharyngeal Swab     Status: None   Collection Time: 12/22/19  6:59 AM   Specimen: Nasopharyngeal Swab  Result Value Ref Range Status   SARS Coronavirus 2 NEGATIVE NEGATIVE Final  Comment: (NOTE) SARS-CoV-2 target nucleic acids are NOT DETECTED.  The SARS-CoV-2 RNA is generally detectable in upper and lower respiratory specimens during the acute phase of infection. The lowest concentration of SARS-CoV-2 viral copies this assay can detect is 250 copies / mL. A negative result does not preclude SARS-CoV-2 infection and should not be used as the sole basis for treatment or other patient management decisions.  A negative result may occur with improper specimen collection / handling, submission of specimen other than nasopharyngeal swab, presence of viral mutation(s) within the areas targeted by this assay, and inadequate number of viral copies (<250 copies / mL). A negative result must be combined with clinical observations, patient history, and epidemiological information.  Fact Sheet for Patients:   StrictlyIdeas.no  Fact Sheet for Healthcare Providers: BankingDealers.co.za  This test is not yet approved or  cleared by the [place] FDA and has been authorized for  detection and/or diagnosis of SARS-CoV-2 by FDA under an Emergency Use Authorization (EUA).  This EUA will remain in effect (meaning this test can be used) for the duration of the COVID-19 declaration under Section 564(b)(1) of the Act, 21 U.S.C. section 360bbb-3(b)(1), unless the authorization is terminated or revoked sooner.  Performed at [facility], [place] [address], [place], [place] [address]     Radiology Studies: CT HEAD WO CONTRAST  Result Date: 12/23/2019 CLINICAL DATA:  Headache, classic migraine features EXAM: CT HEAD WITHOUT CONTRAST TECHNIQUE: Contiguous axial images were obtained from the base of the skull through the vertex without intravenous contrast. COMPARISON:  CT head 10/01/2019 FINDINGS: Brain: No evidence of acute infarction, hemorrhage, hydrocephalus, extra-axial collection, visible mass lesion or mass effect. Symmetric prominence of the ventricles, cisterns and sulci compatible with parenchymal volume loss. Patchy areas of white matter hypoattenuation are most compatible with chronic microvascular angiopathy. Vascular: Atherosclerotic calcification of the carotid siphons and intradural vertebral arteries. No hyperdense vessel. Skull: No calvarial fracture or suspicious osseous lesion. No scalp swelling or hematoma. Few benign dermal calcifications. Chronic right high parietal scalp infiltration/scarring. Sinuses/Orbits: Nodular mural thickening in the right frontal sinus and ethmoid air cells. Remaining paranasal sinuses and mastoid air cells are clear without pneumatized secretions or air-fluid levels. Middle ear cavities are clear. Debris noted in the bilateral external auditory canals. Included orbital structures are unremarkable. Other: None IMPRESSION: 1. No acute intracranial findings. 2. Mild parenchymal volume loss and chronic microvascular angiopathy. 3. Intracranial atherosclerosis. 4. Debris in the bilateral external auditory canals, correlate for  cerumen impaction. Electronically Signed   By: Lovena Le M.D.   On: 12/23/2019 17:56      Faizah Kandler T. [place]  If 7PM-7AM, please contact night-coverage [email or web address] 12/24/2019, 3:15 PM

## 2019-12-24 NOTE — Evaluation (Signed)
Physical Therapy Evaluation Patient Details Name: Ruben [name] MRN: [id number] DOB: 09/21/1950 Today's Date: 12/24/2019   History of Present Illness  69 y.o. male with medical history significant for alcoholism, aortic atherosclerosis, BPH, GERD who presents to the ER for evaluation of weakness and nausea and admitted for alcohol withdrawal  Clinical Impression  Pt admitted with above diagnosis.  Pt currently with functional limitations due to the deficits listed below (see PT Problem List). Pt will benefit from skilled PT to increase their independence and safety with mobility to allow discharge to the venue listed below.  Pt reluctant to mobilize however eventually agreeable to at least sit EOB.  Pt declined standing or further mobility.  Pt would benefit from SNF upon d/c at this time. Will continue to update recommendations as pt progresses.     Follow Up Recommendations SNF    Equipment Recommendations  None recommended by PT    Recommendations for Other Services       Precautions / Restrictions Precautions Precautions: Fall;Other (comment) Precaution Comments: seizures      Mobility  Bed Mobility Overal bed mobility: Needs Assistance Bed Mobility: Supine to Sit;Sit to Supine     Supine to sit: Mod assist;+2 for safety/equipment Sit to supine: Mod assist;+2 for safety/equipment   General bed mobility comments: increased encouragement provided, pt wished to keep blankets on, able to bring LEs over EOB, assist required for trunk  Transfers                 General transfer comment: pt declined  Ambulation/Gait                Stairs            Wheelchair Mobility    Modified Rankin (Stroke Patients Only)       Balance Overall balance assessment: Needs assistance Sitting-balance support: Feet supported;Bilateral upper extremity supported Sitting balance-Leahy Scale: Poor                                       Pertinent  Vitals/Pain Pain Assessment: No/denies pain    Home Living Family/patient expects to be discharged to:: Private residence Living Arrangements: Alone Available Help at Discharge: Family;Neighbor;Available PRN/intermittently Type of Home: House Home Access: Stairs to enter Entrance Stairs-Rails: None Entrance Stairs-Number of Steps: 2 Home Layout: One level Home Equipment: Walker - 2 wheels Additional Comments: all information obtained from recent admission 2 months ago; pt currently poor historian    Prior Function Level of Independence: Independent         Comments: Pt reports Ind with ADLs, community ambulator without AD, drives, retired. Pt reports using RW this past Saturday-Monday for unsteadiness due to withdrawals with a couple falls.     Hand Dominance        Extremity/Trunk Assessment        Lower Extremity Assessment Lower Extremity Assessment: Generalized weakness       Communication   Communication: No difficulties  Cognition Arousal/Alertness: Awake/alert Behavior During Therapy: Flat affect Overall Cognitive Status: No family/caregiver present to determine baseline cognitive functioning                                 General Comments: pt not able to answer orienation questions, poor historian      General Comments      Exercises  Assessment/Plan    PT Assessment Patient needs continued PT services  PT Problem List Decreased strength;Decreased mobility;Decreased activity tolerance;Decreased balance;Decreased knowledge of use of DME;Decreased coordination;Decreased cognition       PT Treatment Interventions DME instruction;Therapeutic exercise;Stair training;Functional mobility training;Patient/family education;Therapeutic activities;Balance training    PT Goals (Current goals can be found in the Care Plan section)  Acute Rehab PT Goals PT Goal Formulation: With patient Time For Goal Achievement: 01/07/20 Potential to  Achieve Goals: Good    Frequency Min 2X/week   Barriers to discharge        Co-evaluation               AM-PAC PT "6 Clicks" Mobility  Outcome Measure Help needed turning from your back to your side while in a flat bed without using bedrails?: A Lot Help needed moving from lying on your back to sitting on the side of a flat bed without using bedrails?: A Lot Help needed moving to and from a bed to a chair (including a wheelchair)?: A Lot Help needed standing up from a chair using your arms (e.g., wheelchair or bedside chair)?: A Lot Help needed to walk in hospital room?: A Lot Help needed climbing 3-5 steps with a railing? : Total 6 Click Score: 11    End of Session   Activity Tolerance: Patient tolerated treatment well Patient left: in bed;with call bell/phone within reach;with bed alarm set   PT Visit Diagnosis: Other abnormalities of gait and mobility (R26.89)    Time: 7001-7494 PT Time Calculation (min) (ACUTE ONLY): 12 min   Charges:   PT Evaluation $PT Eval Low Complexity: 1 Low     Kati PT, DPT Acute Rehabilitation Services Pager: [phone] Office: [phone]  York Ram E 12/24/2019, 12:05 PM

## 2019-12-24 NOTE — Progress Notes (Signed)
Pharmacy IV to PO conversion  The patient is receiving pantoprazole by the intravenous route.  Based on criteria approved by the Pharmacy and [place], the medication is being converted to the equivalent oral dose form.   No active GI bleeding or impaired absorption  Not s/p esophagectomy  Documented ability to take oral medications for > 24 hr  Plan to continue treatment for at least 1 day  If you have any questions about this conversion, please contact the Pharmacy Department (ext [phone]).  Thank you.  Reuel Boom, PharmD, BCPS [phone] 12/24/2019, 8:28 AM

## 2019-12-24 NOTE — Progress Notes (Signed)
CIWA score 15 pt to receive Ativan 2 mg, was going to give IV but IV has come out and  unable to restart.  IV team consulted.  PO Ativan to be given instead, see MAR.

## 2019-12-25 LAB — CBC WITH DIFFERENTIAL/PLATELET
Abs Immature Granulocytes: 0 10*3/uL (ref 0.00–0.07)
Basophils Absolute: 0 10*3/uL (ref 0.0–0.1)
Basophils Relative: 1 %
Eosinophils Absolute: 0.3 10*3/uL (ref 0.0–0.5)
Eosinophils Relative: 6 %
HCT: 37.7 % — ABNORMAL LOW (ref 39.0–52.0)
Hemoglobin: 12.7 g/dL — ABNORMAL LOW (ref 13.0–17.0)
Immature Granulocytes: 0 %
Lymphocytes Relative: 25 %
Lymphs Abs: 1.1 10*3/uL (ref 0.7–4.0)
MCH: 31.1 pg (ref 26.0–34.0)
MCHC: 33.7 g/dL (ref 30.0–36.0)
MCV: 92.2 fL (ref 80.0–100.0)
Monocytes Absolute: 0.5 10*3/uL (ref 0.1–1.0)
Monocytes Relative: 12 %
Neutro Abs: 2.4 10*3/uL (ref 1.7–7.7)
Neutrophils Relative %: 56 %
Platelets: 59 10*3/uL — ABNORMAL LOW (ref 150–400)
RBC: 4.09 MIL/uL — ABNORMAL LOW (ref 4.22–5.81)
RDW: 13.6 % (ref 11.5–15.5)
WBC: 4.2 10*3/uL (ref 4.0–10.5)
nRBC: 0 % (ref 0.0–0.2)

## 2019-12-25 LAB — COMPREHENSIVE METABOLIC PANEL
ALT: 58 U/L — ABNORMAL HIGH (ref 0–44)
AST: 65 U/L — ABNORMAL HIGH (ref 15–41)
Albumin: 3.5 g/dL (ref 3.5–5.0)
Alkaline Phosphatase: 72 U/L (ref 38–126)
Anion gap: 9 (ref 5–15)
BUN: 18 mg/dL (ref 8–23)
CO2: 24 mmol/L (ref 22–32)
Calcium: 9.3 mg/dL (ref 8.9–10.3)
Chloride: 102 mmol/L (ref 98–111)
Creatinine, Ser: 0.99 mg/dL (ref 0.61–1.24)
GFR calc Af Amer: >60 mL/min (ref >60)
GFR calc non Af Amer: >60 mL/min (ref >60)
Glucose, Bld: 109 mg/dL — ABNORMAL HIGH (ref 70–99)
Potassium: 3.8 mmol/L (ref 3.5–5.1)
Sodium: 135 mmol/L (ref 135–145)
Total Bilirubin: 1 mg/dL (ref 0.3–1.2)
Total Protein: 6.5 g/dL (ref 6.5–8.1)

## 2019-12-25 LAB — MAGNESIUM: Magnesium: 1.6 mg/dL — ABNORMAL LOW (ref 1.7–2.4)

## 2019-12-25 LAB — PHOSPHORUS: Phosphorus: 1 mg/dL — CL (ref 2.5–4.6)

## 2019-12-25 MED ORDER — SODIUM PHOSPHATES 45 MMOLE/15ML IV SOLN
30.0000 mmol | Freq: Once | INTRAVENOUS | Status: AC
  Administered 2019-12-25: 30 mmol via INTRAVENOUS
  Filled 2019-12-25: qty 10

## 2019-12-25 MED ORDER — K PHOS MONO-SOD PHOS DI & MONO 155-852-130 MG PO TABS
500.0000 mg | ORAL_TABLET | Freq: Two times a day (BID) | ORAL | Status: DC
  Administered 2019-12-25 (×2): 500 mg via ORAL
  Filled 2019-12-25 (×3): qty 2

## 2019-12-25 MED ORDER — MAGNESIUM SULFATE 2 GM/50ML IV SOLN
2.0000 g | Freq: Once | INTRAVENOUS | Status: AC
  Administered 2019-12-25: 2 g via INTRAVENOUS
  Filled 2019-12-25: qty 50

## 2019-12-25 MED ORDER — LORAZEPAM 1 MG PO TABS
1.0000 mg | ORAL_TABLET | ORAL | Status: DC | PRN
  Administered 2019-12-26: 1 mg via ORAL
  Filled 2019-12-25: qty 1

## 2019-12-25 MED ORDER — LORAZEPAM 2 MG/ML IJ SOLN
1.0000 mg | INTRAMUSCULAR | Status: DC | PRN
  Administered 2019-12-26 – 2019-12-27 (×2): 1 mg via INTRAVENOUS
  Filled 2019-12-25 (×2): qty 1

## 2019-12-25 NOTE — Progress Notes (Signed)
PROGRESS NOTE  Ruben [name] HBZ:[id number] DOB: [birth year]   PCP: [name], MD  Patient is from: Home.  Lives alone.  Has walker at home but does not use.  DOA: 12/22/2019 LOS: 3  Brief Narrative / Interim history: 69 year old man with history of alcohol abuse, BPH, HTN and B12 deficiency presented to the hospital with alcohol withdrawal symptoms.  Reportedly binge drinking prior to presentation.   He was admitted for alcohol withdrawal, metabolic acidosis and AKI.   Subjective: Seen and examined earlier this morning.  No major events overnight of this morning.  CIWA scores as high as 17 overnight.  Currently seems to stable.  No withdrawal symptoms.  A little bit sleepy likely from Ativan.  No complaints.  He denies pain, shortness of breath, GI or UTI symptoms.  Willing to go to SNF for rehab  Objective: Vitals:   12/25/19 0157 12/25/19 0409 12/25/19 1129 12/25/19 1552  BP: (!) 135/93 128/89 117/89 117/63  Pulse: (!) 113 87 85 76  Resp: 18 20 15 15   Temp: 98.4 F (36.9 C) 97.6 F (36.4 C) 97.8 F (36.6 C) 97.7 F (36.5 C)  TempSrc: Oral  Oral Oral  SpO2: 99% 96% 98% 99%  Weight:      Height:        Intake/Output Summary (Last 24 hours) at 12/25/2019 1556 Last data filed at 12/25/2019 1555 Gross per 24 hour  Intake 932.64 ml  Output 2150 ml  Net -1217.36 ml   Filed Weights   12/22/19 1038 12/24/19 0225  Weight: 81.7 kg 83.3 kg    Examination:  GENERAL: No apparent distress.  Nontoxic. HEENT: MMM.  Vision and hearing grossly intact.  NECK: Supple.  No apparent JVD.  RESP:  No IWOB.  Fair aeration bilaterally. CVS:  RRR. Heart sounds normal.  ABD/GI/GU: BS+. Abd soft, NTND.  MSK/EXT:  Moves extremities. No apparent deformity. No edema.  SKIN: no apparent skin lesion or wound NEURO: Awake but not quite alert.  Oriented x4.  No apparent focal neuro deficit. PSYCH: Calm. Normal affect.  Procedures:  None  Microbiology summarized: COVID-19 PCR  negative.  Assessment & Plan: Alcohol abuse/alcohol withdrawal-reportedly binge drinking prior to presentation.  CIWA scoring ranges from 4-17 overnight. -Continue Librium taper with as needed Ativan. -Continue Multivite, folic acid and thiamine  Anion gap metabolic acidosis: Likely due to alcohol.  Resolved.  Diplopia: Resolved.  PERRL and EOMI on exam.  Hyperglycemia without diagnosis of diabetes: Likely due to alcohol. -Check hemoglobin A1c  Leukocytosis: Resolved  Sinus tachycardia/HTN: Resolved. -Continue clonidine and metoprolol  Hypokalemia/hypomagnesemia/hypophosphatemia: Likely due to alcohol. -Monitor and replenish as appropriate  Mild hyponatremia: could be from LR or alcohol.  Resolved. -Discontinue LR  Pancytopenia: Likely from alcohol.  Leukopenia resolved.  Thrombocytopenia improved.  Hgb stable -Continue monitoring -Check anemia panel  Debility: -PT/OT recommended SNF.  Body mass index is 26.35 kg/m. Nutrition Problem: Inadequate oral intake Etiology: decreased appetite, nausea Signs/Symptoms: per patient/family report Interventions: MVI, Other (Comment) (Prosource Plus)   DVT prophylaxis:  SCDs Start: 12/22/19 0752  Code Status: Full code Family Communication: Patient states no family member for update. Status is: Inpatient  Remains inpatient appropriate because:Persistent severe electrolyte disturbances, IV treatments appropriate due to intensity of illness or inability to take PO and Inpatient level of care appropriate due to severity of illness   Dispo: The patient is from: Home              Anticipated d/c is to: SNF  Anticipated d/c date is: 1 day              Patient currently is not medically stable to d/c.       Consultants:  None   Sch Meds:  Scheduled Meds: . (feeding supplement) PROSource Plus  30 mL Oral BID BM  . atorvastatin  20 mg Oral Daily  . cloNIDine  0.1 mg Oral BID  . feeding supplement (ENSURE  ENLIVE)  237 mL Oral BID BM  . LORazepam  0-4 mg Intravenous Q12H  . metoprolol tartrate  25 mg Oral BID  . multivitamin with minerals  1 tablet Oral Daily  . pantoprazole  40 mg Oral Daily  . phosphorus  500 mg Oral BID   Continuous Infusions: . sodium phosphate  Dextrose 5% IVPB 30 mmol (12/25/19 1250)   PRN Meds:.acetaminophen, ondansetron **OR** ondansetron (ZOFRAN) IV, traZODone  Antimicrobials: Anti-infectives (From admission, onward)   None       I have personally reviewed the following labs and images: CBC: Recent Labs  Lab 12/22/19 0059 12/23/19 0514 12/24/19 0531 12/25/19 0533  WBC 11.1* 4.9 3.5* 4.2  NEUTROABS 9.2*  --  1.9 2.4  HGB 13.7 11.4* 11.2* 12.7*  HCT 44.1 33.3* 33.7* 37.7*  MCV 100.2* 93.0 93.6 92.2  PLT 99* 49* 42* 59*   BMP &GFR Recent Labs  Lab 12/22/19 0107 12/22/19 1517 12/23/19 0514 12/24/19 0531 12/25/19 0533  NA 139 132* 131* 134* 135  K 4.5 4.6 3.1* 3.2* 3.8  CL 103 99 98 101 102  CO2 8* 12* 20* 21* 24  GLUCOSE 89 267* 161* 127* 109*  BUN 22 32* 28* 23 18  CREATININE 1.08 1.56* 1.42* 0.97 0.99  CALCIUM 7.7* 7.6* 8.1* 8.7* 9.3  MG  --   --   --   --  1.6*  PHOS  --   --   --   --  1.0*   Estimated Creatinine Clearance: 73.7 mL/min (by C-G formula based on SCr of 0.99 mg/dL). Liver & Pancreas: Recent Labs  Lab 12/22/19 0107 12/24/19 0531 12/25/19 0533  AST 69* 53* 65*  ALT 51* 42 58*  ALKPHOS 82 72 72  BILITOT 0.8 0.7 1.0  PROT 7.2 5.7* 6.5  ALBUMIN 3.9 3.1* 3.5   No results for input(s): LIPASE, AMYLASE in the last 168 hours. No results for input(s): AMMONIA in the last 168 hours. Diabetic: No results for input(s): HGBA1C in the last 72 hours. No results for input(s): GLUCAP in the last 168 hours. Cardiac Enzymes: No results for input(s): CKTOTAL, CKMB, CKMBINDEX, TROPONINI in the last 168 hours. No results for input(s): PROBNP in the last 8760 hours. Coagulation Profile: No results for input(s): INR, PROTIME in  the last 168 hours. Thyroid Function Tests: No results for input(s): TSH, T4TOTAL, FREET4, T3FREE, THYROIDAB in the last 72 hours. Lipid Profile: No results for input(s): CHOL, HDL, LDLCALC, TRIG, CHOLHDL, LDLDIRECT in the last 72 hours. Anemia Panel: No results for input(s): VITAMINB12, FOLATE, FERRITIN, TIBC, IRON, RETICCTPCT in the last 72 hours. Urine analysis:    Component Value Date/Time   COLORURINE YELLOW 12/22/2019 0626   APPEARANCEUR CLEAR 12/22/2019 0626   LABSPEC 1.017 12/22/2019 0626   PHURINE 5.0 12/22/2019 0626   GLUCOSEU NEGATIVE 12/22/2019 0626   HGBUR SMALL (A) 12/22/2019 0626   BILIRUBINUR NEGATIVE 12/22/2019 0626   KETONESUR 80 (A) 12/22/2019 0626   PROTEINUR 100 (A) 12/22/2019 0626   NITRITE NEGATIVE 12/22/2019 0626   LEUKOCYTESUR NEGATIVE 12/22/2019 1749  Sepsis Labs: Invalid input(s): PROCALCITONIN, [place]  Microbiology: Recent Results (from the past 240 hour(s))  SARS Coronavirus 2 by RT PCR (hospital order, performed in [facility] hospital lab) Nasopharyngeal Nasopharyngeal Swab     Status: None   Collection Time: 12/22/19  6:59 AM   Specimen: Nasopharyngeal Swab  Result Value Ref Range Status   SARS Coronavirus 2 NEGATIVE NEGATIVE Final    Comment: (NOTE) SARS-CoV-2 target nucleic acids are NOT DETECTED.  The SARS-CoV-2 RNA is generally detectable in upper and lower respiratory specimens during the acute phase of infection. The lowest concentration of SARS-CoV-2 viral copies this assay can detect is 250 copies / mL. A negative result does not preclude SARS-CoV-2 infection and should not be used as the sole basis for treatment or other patient management decisions.  A negative result may occur with improper specimen collection / handling, submission of specimen other than nasopharyngeal swab, presence of viral mutation(s) within the areas targeted by this assay, and inadequate number of viral copies (<250 copies / mL). A negative result must  be combined with clinical observations, patient history, and epidemiological information.  Fact Sheet for Patients:   StrictlyIdeas.no  Fact Sheet for Healthcare Providers: BankingDealers.co.za  This test is not yet approved or  cleared by the [place] FDA and has been authorized for detection and/or diagnosis of SARS-CoV-2 by FDA under an Emergency Use Authorization (EUA).  This EUA will remain in effect (meaning this test can be used) for the duration of the COVID-19 declaration under Section 564(b)(1) of the Act, 21 U.S.C. section 360bbb-3(b)(1), unless the authorization is terminated or revoked sooner.  Performed at [facility], [place] [address] [place], [place] [address]     Radiology Studies: No results found.    Andriea Hasegawa T. [place]  If 7PM-7AM, please contact night-coverage [email or web address] 12/25/2019, 3:56 PM

## 2019-12-25 NOTE — Progress Notes (Signed)
CRITICAL VALUE ALERT  Critical Value:  Phosphorus 1.0  Date & Time Notied:  12/25/19 Provider Notified: [id number]  Orders Received/Actions taken: waiting any new orders

## 2019-12-25 NOTE — Care Management Important Message (Signed)
Important Message  Patient Details IM Letter given to the Patient Name: Ruben [name] MRN: [id number] Date of Birth: [birth year]   Medicare Important Message Given:  Yes     Kerin Salen 12/25/2019, 11:22 AM

## 2019-12-25 NOTE — Evaluation (Signed)
Occupational Therapy Evaluation Patient Details Name: Ruben [name] MRN: [id number] DOB: 11-06-1950 Today's Date: 12/25/2019    History of Present Illness 69 y.o. male with medical history significant for alcoholism, aortic atherosclerosis, BPH, GERD who presents to the ER for evaluation of weakness and nausea and admitted for alcohol withdrawal   Clinical Impression   Mr. Ruben [name] is a 69 year old man who presents with generalized weakness, impaired balance and poor activity tolerance resulting in decline in abilities to perform safe mobility and self care tasks. Patient requiring mod assist for bed transfers, min assist for standing with RW and mod - max assist for lower body ADLs and toiletin. Patient's balance significantly impaired limiting ability to step away from bed safely. Patient will benefit from skilled OT services to improve deficits and learn compensatory strategies as needed in order to return to PLOF. Recommend short term rehab at discharge.    Follow Up Recommendations  SNF    Equipment Recommendations  None recommended by OT    Recommendations for Other Services       Precautions / Restrictions Precautions Precautions: Fall;Other (comment) Precaution Comments: seizures Restrictions Weight Bearing Restrictions: No      Mobility Bed Mobility Overal bed mobility: Needs Assistance Bed Mobility: Supine to Sit;Sit to Supine     Supine to sit: Mod assist;HOB elevated Sit to supine: Mod assist   General bed mobility comments: Assistance needed for trunk negotiation with transfers.  Transfers Overall transfer level: Needs assistance Equipment used: Rolling walker (2 wheeled) Transfers: Sit to/from Stand Sit to Stand: Min assist         General transfer comment: Min assist to standf rom bed height with RW. Patinet min assist to take steps to head of bed. Poor balance with posterior lean and unable to safely step away from bed.    Balance Overall  balance assessment: Needs assistance Sitting-balance support: No upper extremity supported;Feet supported Sitting balance-Leahy Scale: Fair     Standing balance support: During functional activity;Bilateral upper extremity supported Standing balance-Leahy Scale: Poor                             ADL either performed or assessed with clinical judgement   ADL Overall ADL's : Needs assistance/impaired Eating/Feeding: Set up   Grooming: Set up   Upper Body Bathing: Set up;Sitting   Lower Body Bathing: Sitting/lateral leans;Moderate assistance   Upper Body Dressing : Set up;Sitting   Lower Body Dressing: Maximal assistance Lower Body Dressing Details (indicate cue type and reason): Attempted multiple times to donn socks at side of bed but unable to reach feet. Unable to stand without external support to pull clothing up Toilet Transfer: Stand-pivot;BSC;RW;Minimal assistance   Toileting- Clothing Manipulation and Hygiene: Maximal assistance;Sit to/from stand       Functional mobility during ADLs: Minimal assistance;Rolling walker       Vision Patient Visual Report: No change from baseline       Perception     Praxis      Pertinent Vitals/Pain Pain Assessment: No/denies pain     Hand Dominance Right   Extremity/Trunk Assessment Upper Extremity Assessment Upper Extremity Assessment: Overall WFL for tasks assessed   Lower Extremity Assessment Lower Extremity Assessment: Defer to PT evaluation   Cervical / Trunk Assessment Cervical / Trunk Assessment: Normal   Communication Communication Communication: No difficulties   Cognition Arousal/Alertness: Awake/alert Behavior During Therapy: WFL for tasks assessed/performed Overall Cognitive Status:  Within Functional Limits for tasks assessed                                     General Comments       Exercises     Shoulder Instructions      Home Living Family/patient expects to be  discharged to:: Private residence Living Arrangements: Alone Available Help at Discharge: Family;Neighbor;Available PRN/intermittently Type of Home: House Home Access: Stairs to enter [employer] of Steps: 2 Entrance Stairs-Rails: None Home Layout: One level     Bathroom Shower/Tub: Tub/shower unit;Walk-in shower   Bathroom Toilet: Handicapped height     Home Equipment: Environmental consultant - 2 wheels;Shower seat   Additional Comments: all information obtained from recent admission 2 months ago; pt currently poor historian      Prior Functioning/Environment Level of Independence: Independent        Comments: Pt reports Ind with ADLs, community ambulator without AD, drives, retired. Pt reports using RW this past Saturday-Monday for unsteadiness due to withdrawals with a couple falls.        OT Problem List: Decreased strength;Decreased activity tolerance;Impaired balance (sitting and/or standing);Decreased safety awareness;Decreased knowledge of use of DME or AE      OT Treatment/Interventions: Self-care/ADL training;Therapeutic exercise;DME and/or AE instruction;Therapeutic activities;Visual/perceptual remediation/compensation;Patient/family education;Balance training    OT Goals(Current goals can be found in the care plan section) Acute Rehab OT Goals Patient Stated Goal: Did not state OT Goal Formulation: With patient Time For Goal Achievement: 01/08/20 Potential to Achieve Goals: Fair  OT Frequency: Min 2X/week   Barriers to D/C: Decreased caregiver support          Co-evaluation              AM-PAC OT "6 Clicks" Daily Activity     Outcome Measure Help from another person eating meals?: A Little Help from another person taking care of personal grooming?: A Little Help from another person toileting, which includes using toliet, bedpan, or urinal?: A Lot Help from another person bathing (including washing, rinsing, drying)?: A Lot Help from another person to  put on and taking off regular upper body clothing?: A Little Help from another person to put on and taking off regular lower body clothing?: A Lot 6 Click Score: 15   End of Session Equipment Utilized During Treatment: Gait belt;Rolling walker Nurse Communication:  (okay to see)  Activity Tolerance: Patient tolerated treatment well Patient left: in bed;with call bell/phone within reach;with bed alarm set  OT Visit Diagnosis: Unsteadiness on feet (R26.81);Muscle weakness (generalized) (M62.81)                Time: 1420-1440 OT Time Calculation (min): 20 min Charges:  OT General Charges $OT Visit: 1 Visit OT Evaluation $OT Eval Moderate Complexity: 1 Mod  Tammy Wickliffe, OTR/L [place]  Office [phone] Pager: [place] 12/25/2019, 4:55 PM

## 2019-12-26 DIAGNOSIS — R748 Abnormal levels of other serum enzymes: Secondary | ICD-10-CM

## 2019-12-26 DIAGNOSIS — R000 Tachycardia, unspecified: Secondary | ICD-10-CM

## 2019-12-26 LAB — CBC WITH DIFFERENTIAL/PLATELET
Abs Immature Granulocytes: 0.01 10*3/uL (ref 0.00–0.07)
Basophils Absolute: 0.1 10*3/uL (ref 0.0–0.1)
Basophils Relative: 1 %
Eosinophils Absolute: 0.4 10*3/uL (ref 0.0–0.5)
Eosinophils Relative: 8 %
HCT: 38.9 % — ABNORMAL LOW (ref 39.0–52.0)
Hemoglobin: 12.8 g/dL — ABNORMAL LOW (ref 13.0–17.0)
Immature Granulocytes: 0 %
Lymphocytes Relative: 29 %
Lymphs Abs: 1.2 10*3/uL (ref 0.7–4.0)
MCH: 31.2 pg (ref 26.0–34.0)
MCHC: 32.9 g/dL (ref 30.0–36.0)
MCV: 94.9 fL (ref 80.0–100.0)
Monocytes Absolute: 0.6 10*3/uL (ref 0.1–1.0)
Monocytes Relative: 15 %
Neutro Abs: 2 10*3/uL (ref 1.7–7.7)
Neutrophils Relative %: 47 %
Platelets: 98 10*3/uL — ABNORMAL LOW (ref 150–400)
RBC: 4.1 MIL/uL — ABNORMAL LOW (ref 4.22–5.81)
RDW: 14.1 % (ref 11.5–15.5)
WBC: 4.2 10*3/uL (ref 4.0–10.5)
nRBC: 0 % (ref 0.0–0.2)

## 2019-12-26 LAB — COMPREHENSIVE METABOLIC PANEL
ALT: 77 U/L — ABNORMAL HIGH (ref 0–44)
AST: 75 U/L — ABNORMAL HIGH (ref 15–41)
Albumin: 3.2 g/dL — ABNORMAL LOW (ref 3.5–5.0)
Alkaline Phosphatase: 68 U/L (ref 38–126)
Anion gap: 11 (ref 5–15)
BUN: 21 mg/dL (ref 8–23)
CO2: 27 mmol/L (ref 22–32)
Calcium: 9 mg/dL (ref 8.9–10.3)
Chloride: 99 mmol/L (ref 98–111)
Creatinine, Ser: 1.13 mg/dL (ref 0.61–1.24)
GFR calc Af Amer: >60 mL/min (ref >60)
GFR calc non Af Amer: >60 mL/min (ref >60)
Glucose, Bld: 116 mg/dL — ABNORMAL HIGH (ref 70–99)
Potassium: 3.8 mmol/L (ref 3.5–5.1)
Sodium: 137 mmol/L (ref 135–145)
Total Bilirubin: 0.7 mg/dL (ref 0.3–1.2)
Total Protein: 6.2 g/dL — ABNORMAL LOW (ref 6.5–8.1)

## 2019-12-26 LAB — HIV ANTIBODY (ROUTINE TESTING W REFLEX): HIV Screen 4th Generation wRfx: NONREACTIVE

## 2019-12-26 LAB — RETICULOCYTES
Immature Retic Fract: 10.7 % (ref 2.3–15.9)
RBC.: 4.05 MIL/uL — ABNORMAL LOW (ref 4.22–5.81)
Retic Count, Absolute: 71.3 10*3/uL (ref 19.0–186.0)
Retic Ct Pct: 1.8 % (ref 0.4–3.1)

## 2019-12-26 LAB — HEMOGLOBIN A1C
Hgb A1c MFr Bld: 5.3 % (ref 4.8–5.6)
Mean Plasma Glucose: 105.41 mg/dL

## 2019-12-26 LAB — IRON AND TIBC
Iron: 44 ug/dL — ABNORMAL LOW (ref 45–182)
Saturation Ratios: 21 % (ref 17.9–39.5)
TIBC: 211 ug/dL — ABNORMAL LOW (ref 250–450)
UIBC: 167 ug/dL

## 2019-12-26 LAB — FERRITIN: Ferritin: 491 ng/mL — ABNORMAL HIGH (ref 24–336)

## 2019-12-26 LAB — HEPATITIS PANEL, ACUTE
HCV Ab: NONREACTIVE
Hep A IgM: NONREACTIVE
Hep B C IgM: NONREACTIVE
Hepatitis B Surface Ag: NONREACTIVE

## 2019-12-26 LAB — VITAMIN B12: Vitamin B-12: 440 pg/mL (ref 180–914)

## 2019-12-26 LAB — PHOSPHORUS: Phosphorus: 5.6 mg/dL — ABNORMAL HIGH (ref 2.5–4.6)

## 2019-12-26 LAB — MAGNESIUM: Magnesium: 1.8 mg/dL (ref 1.7–2.4)

## 2019-12-26 LAB — FOLATE: Folate: 12.6 ng/mL (ref >5.9)

## 2019-12-26 LAB — CK: Total CK: 12 U/L — ABNORMAL LOW (ref 49–397)

## 2019-12-26 MED ORDER — ZOLPIDEM TARTRATE 5 MG PO TABS
5.0000 mg | ORAL_TABLET | Freq: Every evening | ORAL | Status: DC | PRN
  Administered 2019-12-27: 5 mg via ORAL
  Filled 2019-12-26: qty 1

## 2019-12-26 NOTE — Progress Notes (Signed)
PROGRESS NOTE  Ruben [name] IRC:[id number] DOB: [birth year]   PCP: [name], MD  Patient is from: Home.  Lives alone.  Has walker at home but does not use.  DOA: 12/22/2019 LOS: 4  Brief Narrative / Interim history: 69 year old man with history of alcohol abuse, BPH, HTN and B12 deficiency presented to the hospital with alcohol withdrawal symptoms.  Reportedly binge drinking prior to presentation.    He was admitted for alcohol withdrawal, metabolic acidosis and AKI.  Started on IV fluid, Librium taper and CIWA with as needed Ativan.  AKI and metabolic acidosis resolved.  Patient was evaluated by therapy who recommended SNF.  Subjective: Seen and examined earlier this morning.  No major events overnight of this morning.  He became tachycardic to 130s and 140s just sitting on the edge of the bed while working with therapy this morning.  He was not symptomatic.  He denies chest pain, dyspnea, GI or UTI symptoms but not a great historian.  Objective: Vitals:   12/25/19 2125 12/26/19 0511 12/26/19 1051 12/26/19 1335  BP: 124/80 116/78 108/72 102/74  Pulse: 85 86 92 95  Resp: 18 14  15   Temp: 97.7 F (36.5 C) 98.2 F (36.8 C)  97.9 F (36.6 C)  TempSrc:  Oral  Oral  SpO2: 97% 97%  95%  Weight:      Height:        Intake/Output Summary (Last 24 hours) at 12/26/2019 1452 Last data filed at 12/26/2019 1300 Gross per 24 hour  Intake 1457.42 ml  Output 1500 ml  Net -42.58 ml   Filed Weights   12/22/19 1038 12/24/19 0225  Weight: 81.7 kg 83.3 kg    Examination:  GENERAL: No apparent distress.  Nontoxic. HEENT: MMM.  Vision and hearing grossly intact.  NECK: Supple.  No apparent JVD.  RESP:  No IWOB.  Fair aeration bilaterally. CVS:  RRR. Heart sounds normal.  ABD/GI/GU: BS+. Abd soft, NTND.  MSK/EXT:  Moves extremities. No apparent deformity. No edema.  SKIN: no apparent skin lesion or wound NEURO: Awake, alert and oriented appropriately.  No apparent focal neuro  deficit. PSYCH: Calm. Normal affect. Procedures:  None  Microbiology summarized: COVID-19 PCR negative.  Assessment & Plan: Alcohol abuse/alcohol withdrawal-reportedly binge drinking prior to presentation.  CIWA scoring ranges from 2-5. -Completed Librium taper. -Continue clonidine and as needed Ativan per CIWA score -Continue Multivite, folic acid and thiamine  Elevated liver enzymes: AST and ALT have trended to 75/77 respectively.  Total bili within normal.  Could be due to alcohol. -Check acute hepatitis panel and HIV. -Check CK  Transient tachycardia with activity: As symptomatic. -Continue metoprolol and clonidine  Hypokalemia/hypomagnesemia/hypophosphatemia: Likely due to alcohol. -Monitor and replenish as appropriate  Hyperphosphatemia: P 5.6. -Discontinue p.o. phosphorus  Anion gap metabolic acidosis: Likely due to alcohol.  Resolved.  Diplopia: Resolved.  PERRL and EOMI on exam.  Hyperglycemia without diagnosis of diabetes: Likely due to alcohol.  A1c 5.3%.  Leukocytosis: Resolved   Mild hyponatremia: could be from LR or alcohol.  Resolved. -Discontinue LR  Pancytopenia: Likely from alcohol.  Leukopenia resolved.  Thrombocytopenia improved.  Anemia panel okay.  Hgb stable -Continue monitoring  Debility/physical deconditioning -PT/OT recommended SNF.  Body mass index is 26.35 kg/m. Nutrition Problem: Inadequate oral intake Etiology: decreased appetite, nausea Signs/Symptoms: per patient/family report Interventions: MVI, Other (Comment) (Prosource Plus)   DVT prophylaxis:  SCDs Start: 12/22/19 0752  Code Status: Full code Family Communication: Patient states no family member for  update. Status is: Inpatient  Remains inpatient appropriate because:Unsafe d/c plan   Dispo: The patient is from: Home              Anticipated d/c is to: SNF              Anticipated d/c date is: 1 day              Patient currently is medically stable to  d/c.       Consultants:  None   Sch Meds:  Scheduled Meds: . (feeding supplement) PROSource Plus  30 mL Oral BID BM  . cloNIDine  0.1 mg Oral BID  . feeding supplement (ENSURE ENLIVE)  237 mL Oral BID BM  . metoprolol tartrate  25 mg Oral BID  . multivitamin with minerals  1 tablet Oral Daily  . pantoprazole  40 mg Oral Daily   Continuous Infusions:  PRN Meds:.acetaminophen, LORazepam **OR** LORazepam, ondansetron **OR** ondansetron (ZOFRAN) IV, traZODone  Antimicrobials: Anti-infectives (From admission, onward)   None       I have personally reviewed the following labs and images: CBC: Recent Labs  Lab 12/22/19 0059 12/23/19 0514 12/24/19 0531 12/25/19 0533 12/26/19 0501  WBC 11.1* 4.9 3.5* 4.2 4.2  NEUTROABS 9.2*  --  1.9 2.4 2.0  HGB 13.7 11.4* 11.2* 12.7* 12.8*  HCT 44.1 33.3* 33.7* 37.7* 38.9*  MCV 100.2* 93.0 93.6 92.2 94.9  PLT 99* 49* 42* 59* 98*   BMP &GFR Recent Labs  Lab 12/22/19 1517 12/23/19 0514 12/24/19 0531 12/25/19 0533 12/26/19 0501  NA 132* 131* 134* 135 137  K 4.6 3.1* 3.2* 3.8 3.8  CL 99 98 101 102 99  CO2 12* 20* 21* 24 27  GLUCOSE 267* 161* 127* 109* 116*  BUN 32* 28* 23 18 21   CREATININE 1.56* 1.42* 0.97 0.99 1.13  CALCIUM 7.6* 8.1* 8.7* 9.3 9.0  MG  --   --   --  1.6* 1.8  PHOS  --   --   --  1.0* 5.6*   Estimated Creatinine Clearance: 64.6 mL/min (by C-G formula based on SCr of 1.13 mg/dL). Liver & Pancreas: Recent Labs  Lab 12/22/19 0107 12/24/19 0531 12/25/19 0533 12/26/19 0501  AST 69* 53* 65* 75*  ALT 51* 42 58* 77*  ALKPHOS 82 72 72 68  BILITOT 0.8 0.7 1.0 0.7  PROT 7.2 5.7* 6.5 6.2*  ALBUMIN 3.9 3.1* 3.5 3.2*   No results for input(s): LIPASE, AMYLASE in the last 168 hours. No results for input(s): AMMONIA in the last 168 hours. Diabetic: Recent Labs    12/26/19 0501  HGBA1C 5.3   No results for input(s): GLUCAP in the last 168 hours. Cardiac Enzymes: No results for input(s): CKTOTAL, CKMB,  CKMBINDEX, TROPONINI in the last 168 hours. No results for input(s): PROBNP in the last 8760 hours. Coagulation Profile: No results for input(s): INR, PROTIME in the last 168 hours. Thyroid Function Tests: No results for input(s): TSH, T4TOTAL, FREET4, T3FREE, THYROIDAB in the last 72 hours. Lipid Profile: No results for input(s): CHOL, HDL, LDLCALC, TRIG, CHOLHDL, LDLDIRECT in the last 72 hours. Anemia Panel: Recent Labs    12/26/19 0501  VITAMINB12 440  FOLATE 12.6  FERRITIN 491*  TIBC 211*  IRON 44*  RETICCTPCT 1.8   Urine analysis:    Component Value Date/Time   COLORURINE YELLOW 12/22/2019 0626   APPEARANCEUR CLEAR 12/22/2019 0626   LABSPEC 1.017 12/22/2019 0626   PHURINE 5.0 12/22/2019 0626   GLUCOSEU  NEGATIVE 12/22/2019 0626   HGBUR SMALL (A) 12/22/2019 0626   BILIRUBINUR NEGATIVE 12/22/2019 0626   KETONESUR 80 (A) 12/22/2019 0626   PROTEINUR 100 (A) 12/22/2019 0626   NITRITE NEGATIVE 12/22/2019 0626   LEUKOCYTESUR NEGATIVE 12/22/2019 0626   Sepsis Labs: Invalid input(s): PROCALCITONIN, [place]  Microbiology: Recent Results (from the past 240 hour(s))  SARS Coronavirus 2 by RT PCR (hospital order, performed in [facility] hospital lab) Nasopharyngeal Nasopharyngeal Swab     Status: None   Collection Time: 12/22/19  6:59 AM   Specimen: Nasopharyngeal Swab  Result Value Ref Range Status   SARS Coronavirus 2 NEGATIVE NEGATIVE Final    Comment: (NOTE) SARS-CoV-2 target nucleic acids are NOT DETECTED.  The SARS-CoV-2 RNA is generally detectable in upper and lower respiratory specimens during the acute phase of infection. The lowest concentration of SARS-CoV-2 viral copies this assay can detect is 250 copies / mL. A negative result does not preclude SARS-CoV-2 infection and should not be used as the sole basis for treatment or other patient management decisions.  A negative result may occur with improper specimen collection / handling, submission of specimen  other than nasopharyngeal swab, presence of viral mutation(s) within the areas targeted by this assay, and inadequate number of viral copies (<250 copies / mL). A negative result must be combined with clinical observations, patient history, and epidemiological information.  Fact Sheet for Patients:   StrictlyIdeas.no  Fact Sheet for Healthcare Providers: BankingDealers.co.za  This test is not yet approved or  cleared by the [place] FDA and has been authorized for detection and/or diagnosis of SARS-CoV-2 by FDA under an Emergency Use Authorization (EUA).  This EUA will remain in effect (meaning this test can be used) for the duration of the COVID-19 declaration under Section 564(b)(1) of the Act, 21 U.S.C. section 360bbb-3(b)(1), unless the authorization is terminated or revoked sooner.  Performed at [facility], [place] [address], [place], [place] [address]     Radiology Studies: No results found.    Johnasia Liese T. [place]  If 7PM-7AM, please contact night-coverage [email or web address] 12/26/2019, 2:52 PM

## 2019-12-26 NOTE — [place] FL2 (Signed)
[place] MEDICAID FL2 LEVEL OF CARE SCREENING TOOL     IDENTIFICATION  Patient Name: Ruben [name] Birthdate: Sep 03, 1950 Sex: male Admission Date (Current Location): 12/22/2019  [facility] and [place] Number:  Herbalist and Address:  [facility],  [address] [place], [place]      Provider Number: [id number]  Attending Physician Name and Address:  Mercy Riding, MD  Relative Name and Phone Number:  Westley Hummer Relative [phone] Lajoyce Corners Other [phone]    Current Level of Care: Hospital Recommended Level of Care: [place] Prior Approval Number:    Date Approved/Denied:   PASRR Number: [id number]  Discharge Plan: SNF    Current Diagnoses: Patient Active Problem List   Diagnosis Date Noted  . Alcoholic ketoacidosis 89/37/3428  . Thrombocytopenia ([place]) 12/22/2019  . ARF (acute renal failure) ([place]) 10/01/2019  . Alcohol withdrawal ([place]) 10/01/2019  . Normochromic normocytic anemia 10/01/2019  . GERD (gastroesophageal reflux disease) 06/22/2019  . BPH (benign prostatic hyperplasia) 06/22/2019  . Essential hypertension 06/22/2019  . AKI (acute kidney injury) ([place])   . Alcohol intoxication ([place]) 06/21/2019  . S/P Nissen fundoplication (without gastrostomy tube) procedure 04/26/2018    Orientation RESPIRATION BLADDER Height & Weight     Self, Time, Situation, Place  Normal Incontinent Weight: 183 lb 10.3 oz (83.3 kg) Height:  5\' 10"  (177.8 cm)  BEHAVIORAL SYMPTOMS/MOOD NEUROLOGICAL BOWEL NUTRITION STATUS      Incontinent Diet (2G sodium diet)  AMBULATORY STATUS COMMUNICATION OF NEEDS Skin   Limited Assist Verbally Normal                       Personal Care Assistance Level of Assistance  Bathing, Dressing, Feeding Bathing Assistance: Limited assistance Feeding assistance: Independent Dressing Assistance: Limited assistance     Functional Limitations Info  Sight, Hearing, Speech Sight Info:  Adequate Hearing Info: Adequate Speech Info: Adequate    SPECIAL CARE FACTORS FREQUENCY  PT (By licensed PT), OT (By licensed OT)     PT Frequency: Minimum 5x a week OT Frequency: Minimum 5x a week            Contractures Contractures Info: Not present    Additional Factors Info  Code Status, Allergies Code Status Info: Full Code Allergies Info: NKA           Current Medications (12/26/2019):  This is the current hospital active medication list Current Facility-Administered Medications  Medication Dose Route Frequency Provider Last Rate Last Admin  . (feeding supplement) PROSource Plus liquid 30 mL  30 mL Oral BID BM Verlon Au, Jai-Gurmukh, MD   30 mL at 12/26/19 1000  . acetaminophen (TYLENOL) tablet 650 mg  650 mg Oral Q6H PRN Agbata, Tochukwu, MD   650 mg at 12/22/19 1410  . cloNIDine (CATAPRES) tablet 0.1 mg  0.1 mg Oral BID Agbata, Tochukwu, MD   0.1 mg at 12/26/19 0958  . feeding supplement (ENSURE ENLIVE) (ENSURE ENLIVE) liquid 237 mL  237 mL Oral BID BM Agbata, Tochukwu, MD   237 mL at 12/26/19 1536  . LORazepam (ATIVAN) tablet 1-4 mg  1-4 mg Oral Q1H PRN Wendee Beavers T, MD   1 mg at 12/26/19 1005   Or  . LORazepam (ATIVAN) injection 1-4 mg  1-4 mg Intravenous Q1H PRN Wendee Beavers T, MD      . metoprolol tartrate (LOPRESSOR) tablet 25 mg  25 mg Oral BID Nita Sells, MD   25 mg at 12/26/19 0958  .  multivitamin with minerals tablet 1 tablet  1 tablet Oral Daily Nita Sells, MD   1 tablet at 12/26/19 0958  . ondansetron (ZOFRAN) tablet 4 mg  4 mg Oral Q6H PRN Agbata, Tochukwu, MD       Or  . ondansetron (ZOFRAN) injection 4 mg  4 mg Intravenous Q6H PRN Agbata, Tochukwu, MD      . pantoprazole (PROTONIX) EC tablet 40 mg  40 mg Oral Daily Reuel Boom A, RPH   40 mg at 12/26/19 0958  . traZODone (DESYREL) tablet 50 mg  50 mg Oral QHS PRN Agbata, Tochukwu, MD   50 mg at 12/24/19 2249     Discharge Medications: Please see discharge summary for a list of  discharge medications.  Relevant Imaging Results:  Relevant Lab Results:   Additional Information SSN [id number]  Ross Ludwig, LCSW

## 2019-12-26 NOTE — Progress Notes (Signed)
Physical Therapy Treatment Patient Details Name: Ruben [name] MRN: [id number] DOB: 14-Aug-1950 Today's Date: 12/26/2019    History of Present Illness 69 y.o. male with medical history significant for alcoholism, aortic atherosclerosis, BPH, GERD who presents to the ER for evaluation of weakness and nausea and admitted for alcohol withdrawal    PT Comments    Pt denies pain and reports feeling of anxiousness upon arrival, but motivated to get up and move with therapy. Pt requires min A with HHA to get to EOB with HOB elevated. Upon sitting, pt quickly rises to standing with RW and no physical assist. Once standing, pt's HR noted to be in 140s and elevated to 160 max during ~60 sec standing with RW so cued pt to return to sitting. Pt's HR remained 130-150bpm in sitting so cued to return to supine with HR 115-125bpm. RN in room monitoring pt's HR at EOS. Pt continuously denies pain, lightheadedness, racing sensation or other abnormal feelings. Pt reports "a little" dizziness with initial stand that subsided quickly. Educated pt on slow return to activity and continuing therapy while in hospital. Patient will benefit from continued physical therapy in hospital and recommendations below to increase strength, balance, endurance for safe ADLs and gait.   Follow Up Recommendations  SNF     Equipment Recommendations  None recommended by PT    Recommendations for Other Services       Precautions / Restrictions Precautions Precautions: Fall;Other (comment) Precaution Comments: seizures, monitor HR Restrictions Weight Bearing Restrictions: No    Mobility  Bed Mobility Overal bed mobility: Needs Assistance Bed Mobility: Supine to Sit;Sit to Supine  Supine to sit: Min assist;HOB elevated Sit to supine: Min guard   General bed mobility comments: min A to come to sitting EOB with HHA, slow movement with increased time  Transfers Overall transfer level: Needs assistance Equipment used:  Rolling walker (2 wheeled) Transfers: Sit to/from Stand Sit to Stand: Min guard    General transfer comment: BUE assisting to power up, BLE braced on bed for steadying, rises quickly before cued by therapist  Ambulation/Gait  General Gait Details: static standing with RW for ~60 sec with HR max 160bpm so unable to attempt ambulation   Stairs             Wheelchair Mobility    Modified Rankin (Stroke Patients Only)       Balance Overall balance assessment: Needs assistance Sitting-balance support: No upper extremity supported;Feet supported Sitting balance-Leahy Scale: Fair Sitting balance - Comments: seated EOB   Standing balance support: During functional activity;Bilateral upper extremity supported Standing balance-Leahy Scale: Poor Standing balance comment: UE support on RW with static standing         Cognition Arousal/Alertness: Awake/alert Behavior During Therapy: Flat affect;Anxious (pt reports "I'm just anxious") Overall Cognitive Status: Within Functional Limits for tasks assessed       Exercises      General Comments General comments (skin integrity, edema, etc.): HR max 160bpm with static standing after ~60 seconds so returned to sitting; HR remained 130-150bpm in sitting so returned to supine; HR maintained 115-125bpm in supine with RN in room      Pertinent Vitals/Pain Pain Assessment: No/denies pain    Home Living                      Prior Function            PT Goals (current goals can now be found in the care  plan section) Acute Rehab PT Goals Patient Stated Goal: none stated PT Goal Formulation: With patient Time For Goal Achievement: 01/07/20 Potential to Achieve Goals: Good Progress towards PT goals: Progressing toward goals    Frequency    Min 2X/week      PT Plan Current plan remains appropriate    Co-evaluation              AM-PAC PT "6 Clicks" Mobility   Outcome Measure  Help needed turning from  your back to your side while in a flat bed without using bedrails?: A Little Help needed moving from lying on your back to sitting on the side of a flat bed without using bedrails?: A Little Help needed moving to and from a bed to a chair (including a wheelchair)?: A Lot Help needed standing up from a chair using your arms (e.g., wheelchair or bedside chair)?: A Little Help needed to walk in hospital room?: A Lot Help needed climbing 3-5 steps with a railing? : Total 6 Click Score: 14    End of Session   Activity Tolerance: Patient tolerated treatment well Patient left: in bed;with call bell/phone within reach;with bed alarm set;with nursing/sitter in room Nurse Communication: Mobility status;Other (comment) (HR with mobility) PT Visit Diagnosis: Other abnormalities of gait and mobility (R26.89)     Time: 0762-2633 PT Time Calculation (min) (ACUTE ONLY): 14 min  Charges:  $Therapeutic Activity: 8-22 mins                      Tori Shray Hunley PT, DPT 12/26/19, 11:36 AM

## 2019-12-27 DIAGNOSIS — K5900 Constipation, unspecified: Secondary | ICD-10-CM

## 2019-12-27 DIAGNOSIS — F419 Anxiety disorder, unspecified: Secondary | ICD-10-CM

## 2019-12-27 DIAGNOSIS — G4700 Insomnia, unspecified: Secondary | ICD-10-CM

## 2019-12-27 LAB — COMPREHENSIVE METABOLIC PANEL
ALT: 76 U/L — ABNORMAL HIGH (ref 0–44)
AST: 63 U/L — ABNORMAL HIGH (ref 15–41)
Albumin: 3.2 g/dL — ABNORMAL LOW (ref 3.5–5.0)
Alkaline Phosphatase: 65 U/L (ref 38–126)
Anion gap: 9 (ref 5–15)
BUN: 25 mg/dL — ABNORMAL HIGH (ref 8–23)
CO2: 29 mmol/L (ref 22–32)
Calcium: 8.8 mg/dL — ABNORMAL LOW (ref 8.9–10.3)
Chloride: 101 mmol/L (ref 98–111)
Creatinine, Ser: 1.13 mg/dL (ref 0.61–1.24)
GFR calc Af Amer: >60 mL/min (ref >60)
GFR calc non Af Amer: >60 mL/min (ref >60)
Glucose, Bld: 104 mg/dL — ABNORMAL HIGH (ref 70–99)
Potassium: 3.9 mmol/L (ref 3.5–5.1)
Sodium: 139 mmol/L (ref 135–145)
Total Bilirubin: 0.5 mg/dL (ref 0.3–1.2)
Total Protein: 5.9 g/dL — ABNORMAL LOW (ref 6.5–8.1)

## 2019-12-27 LAB — MAGNESIUM: Magnesium: 1.8 mg/dL (ref 1.7–2.4)

## 2019-12-27 LAB — CBC
HCT: 37 % — ABNORMAL LOW (ref 39.0–52.0)
Hemoglobin: 12.3 g/dL — ABNORMAL LOW (ref 13.0–17.0)
MCH: 31.2 pg (ref 26.0–34.0)
MCHC: 33.2 g/dL (ref 30.0–36.0)
MCV: 93.9 fL (ref 80.0–100.0)
Platelets: 145 10*3/uL — ABNORMAL LOW (ref 150–400)
RBC: 3.94 MIL/uL — ABNORMAL LOW (ref 4.22–5.81)
RDW: 13.9 % (ref 11.5–15.5)
WBC: 5.7 10*3/uL (ref 4.0–10.5)
nRBC: 0 % (ref 0.0–0.2)

## 2019-12-27 LAB — PHOSPHORUS: Phosphorus: 3.2 mg/dL (ref 2.5–4.6)

## 2019-12-27 MED ORDER — BUSPIRONE HCL 5 MG PO TABS
5.0000 mg | ORAL_TABLET | Freq: Two times a day (BID) | ORAL | Status: DC
  Administered 2019-12-27 (×2): 5 mg via ORAL
  Filled 2019-12-27 (×2): qty 1

## 2019-12-27 MED ORDER — CLONAZEPAM 0.125 MG PO TBDP
0.2500 mg | ORAL_TABLET | Freq: Every morning | ORAL | Status: DC
  Administered 2019-12-28: 0.25 mg via ORAL
  Filled 2019-12-27: qty 2

## 2019-12-27 MED ORDER — SENNOSIDES-DOCUSATE SODIUM 8.6-50 MG PO TABS
1.0000 | ORAL_TABLET | Freq: Two times a day (BID) | ORAL | Status: DC | PRN
  Administered 2019-12-27 – 2019-12-28 (×3): 1 via ORAL
  Filled 2019-12-27 (×3): qty 1

## 2019-12-27 MED ORDER — CLONAZEPAM 0.5 MG PO TABS
0.5000 mg | ORAL_TABLET | Freq: Every day | ORAL | Status: DC
  Administered 2019-12-27: 0.5 mg via ORAL
  Filled 2019-12-27: qty 1

## 2019-12-27 MED ORDER — POLYETHYLENE GLYCOL 3350 17 G PO PACK
17.0000 g | PACK | Freq: Two times a day (BID) | ORAL | Status: DC | PRN
  Administered 2019-12-27 – 2019-12-28 (×3): 17 g via ORAL
  Filled 2019-12-27 (×3): qty 1

## 2019-12-27 NOTE — TOC Progression Note (Signed)
Transition of Care [facility] - Progression Note    Patient Details  Name: Ruben [name] MRN: [id number] Date of Birth: 1950-11-13  Transition of Care [facility]) CM/SW Contact  Ross Ludwig, [place] Phone Number: 12/27/2019, 4:09 PM  Clinical Narrative:    Patient does not have any bed offers yet.  SNF bed still pending, CSW faxed information out to different facilities.    Expected Discharge Plan and Services        Plan to go to a SNF once bed is available for short term rehab.                                         Social Determinants of Health (SDOH) Interventions    Readmission Risk Interventions No flowsheet data found.

## 2019-12-27 NOTE — Progress Notes (Signed)
Physical Therapy Treatment Patient Details Name: Ruben [name] MRN: [id number] DOB: 13-May-1950 Today's Date: 12/27/2019    History of Present Illness Pt is 69 y.o. male with medical history significant for alcoholism, aortic atherosclerosis, BPH, GERD who presents to the ER for evaluation of weakness and nausea and admitted for alcohol withdrawal    PT Comments    Pt making excellent progress with mobility and had improved HR control with activity (RN reports had given meds ~30 min-1 hr prior to PT).  Pt does still have decreased safety awareness and unsteadiness at time and lives alone, so may continue to benefit from SNF prior to return home. Cont POC.     Follow Up Recommendations  SNF     Equipment Recommendations  None recommended by PT    Recommendations for Other Services       Precautions / Restrictions Precautions Precautions: Fall;Other (comment) Precaution Comments: seizures, monitor HR Restrictions Weight Bearing Restrictions: No    Mobility  Bed Mobility Overal bed mobility: Needs Assistance Bed Mobility: Supine to Sit     Supine to sit: Supervision        Transfers Overall transfer level: Needs assistance Equipment used: Rolling walker (2 wheeled) Transfers: Sit to/from Stand Sit to Stand: Min guard         General transfer comment: min guard for steadying; performed x 5 throughout session  Ambulation/Gait Ambulation/Gait assistance: Min guard;Min assist Gait Distance (Feet): 400 Feet Assistive device: Rolling walker (2 wheeled) Gait Pattern/deviations: Step-through pattern;Trunk flexed Gait velocity: decreased   General Gait Details: Pt initially with multiple posterior LOB requiring min A to steady but due to lifting walker to advance; cued for pushing RW and able to progress to min guard; HR 89 bpm rest up to 120 with ambulation   Stairs             Wheelchair Mobility    Modified Rankin (Stroke Patients Only)        Balance Overall balance assessment: Needs assistance Sitting-balance support: No upper extremity supported;Feet supported Sitting balance-Leahy Scale: Good     Standing balance support: No upper extremity supported;Bilateral upper extremity supported Standing balance-Leahy Scale: Fair Standing balance comment: Initially requiring RW and min A for steadying, but once up was able to participate in standing balance activities without UE support               High Level Balance Comments: Standing marching x 10, standing feet together EO/EC 30 sec, side/back/forward stepping, reaching outside BOS 5" all without RW; heel raises required RW            Cognition Arousal/Alertness: Awake/alert Behavior During Therapy: WFL for tasks assessed/performed Overall Cognitive Status: Within Functional Limits for tasks assessed                                        Exercises      General Comments General comments (skin integrity, edema, etc.): Pt's HR 80's rest up to 120 max walking; discussed SNF vs HHPT - pt prefers SNF as he lives alone      Pertinent Vitals/Pain Pain Assessment: No/denies pain    Home Living                      Prior Function            PT Goals (current goals can now be  found in the care plan section) Acute Rehab PT Goals Patient Stated Goal: none stated PT Goal Formulation: With patient Time For Goal Achievement: 01/07/20 Potential to Achieve Goals: Good Progress towards PT goals: Progressing toward goals    Frequency    Min 2X/week      PT Plan Current plan remains appropriate    Co-evaluation              AM-PAC PT "6 Clicks" Mobility   Outcome Measure  Help needed turning from your back to your side while in a flat bed without using bedrails?: None Help needed moving from lying on your back to sitting on the side of a flat bed without using bedrails?: None Help needed moving to and from a bed to a chair  (including a wheelchair)?: A Little Help needed standing up from a chair using your arms (e.g., wheelchair or bedside chair)?: A Little Help needed to walk in hospital room?: A Little Help needed climbing 3-5 steps with a railing? : A Little 6 Click Score: 20    End of Session Equipment Utilized During Treatment: Gait belt Activity Tolerance: Patient tolerated treatment well Patient left: with call bell/phone within reach;with bed alarm set;in bed Nurse Communication: Mobility status PT Visit Diagnosis: Other abnormalities of gait and mobility (R26.89)     Time: 8144-8185 PT Time Calculation (min) (ACUTE ONLY): 33 min  Charges:  $Gait Training: 8-22 mins $Neuromuscular Re-education: 8-22 mins                     Abran Morocco, PT Acute Rehab Services Pager [phone] [facility] [phone]     Karlton Lemon 12/27/2019, 1:18 PM

## 2019-12-27 NOTE — Progress Notes (Signed)
PROGRESS NOTE  Ruben [name]:[id number] DOB: [birth year]   PCP: [name], MD  Patient is from: Home.  Lives alone.  Has walker at home but does not use.  DOA: 12/22/2019 LOS: 5  Brief Narrative / Interim history: 69 year old man with history of alcohol abuse, BPH, HTN and B12 deficiency presented to the hospital with alcohol withdrawal symptoms.  Reportedly binge drinking prior to presentation.    He was admitted for alcohol withdrawal, metabolic acidosis and AKI.  Started on IV fluid, Librium taper and CIWA with as needed Ativan.  AKI and metabolic acidosis resolved.  Patient was evaluated by therapy who recommended SNF.  Subjective: Seen and examined earlier this morning.  No major events overnight of this morning.  Reports difficulty staying asleep despite Ambien.  Also feels anxious.  He has not had a bowel movement in the last 2 to 3 days.  No other complaints.  Objective: Vitals:   12/26/19 1335 12/26/19 2102 12/27/19 0450 12/27/19 1344  BP: 102/74 117/78 (!) 118/93 108/75  Pulse: 95 97 (!) 102 80  Resp: 15 16 16 14   Temp: 97.9 F (36.6 C) 98.4 F (36.9 C) 98.1 F (36.7 C) 98.2 F (36.8 C)  TempSrc: Oral Oral Oral Oral  SpO2: 95% 98% 98% 98%  Weight:      Height:        Intake/Output Summary (Last 24 hours) at 12/27/2019 1502 Last data filed at 12/27/2019 1500 Gross per 24 hour  Intake 1320 ml  Output 520 ml  Net 800 ml   Filed Weights   12/22/19 1038 12/24/19 0225  Weight: 81.7 kg 83.3 kg    Examination:  GENERAL: No apparent distress.  Nontoxic. HEENT: MMM.  Vision and hearing grossly intact.  NECK: Supple.  No apparent JVD.  RESP: On room air.  No IWOB.  Fair aeration bilaterally. CVS:  RRR. Heart sounds normal.  ABD/GI/GU: BS+. Abd soft, NTND.  MSK/EXT:  Moves extremities. No apparent deformity. No edema.  SKIN: no apparent skin lesion or wound NEURO: Awake, alert and oriented appropriately.  No apparent focal neuro deficit. PSYCH: Calm.  Normal affect.   Procedures:  None  Microbiology summarized: COVID-19 PCR negative.  Assessment & Plan: Alcohol abuse/alcohol withdrawal-reportedly binge drinking prior to presentation. -Completed Librium taper and as needed Ativan. -Continue clonidine -Continue Multivite, folic acid and thiamine  Elevated liver enzymes: Slightly improved today.  Total bili within normal.  Acute hepatitis panel and HIV nonreactive.  No GI symptoms.  CK within normal.  Due to alcohol? -Continue monitoring.  Transient tachycardia with activity: Likely chronotropic response to exertion -Continue metoprolol and clonidine  Hypokalemia/hypomagnesemia/hypophosphatemia: Likely due to alcohol. -Monitor and replenish as appropriate  Hyperphosphatemia: P 5.6. -Discontinue p.o. phosphorus  Anion gap metabolic acidosis: Likely due to alcohol.  Resolved.  Diplopia: Resolved.  PERRL and EOMI on exam.  Hyperglycemia without diagnosis of diabetes: Likely due to alcohol.  A1c 5.3%.  Leukocytosis: Resolved   Mild hyponatremia: could be from LR or alcohol.  Resolved. -Discontinue LR  Pancytopenia: Likely from alcohol.  Improving. -Continue monitoring -Optimize nutrition -Vitamins as above.  Debility/physical deconditioning -PT/OT recommended SNF.  Anxiety/insomnia-no good response with trazodone or Ambien. -Klonopin 0.5 mg at night and 0.25 mg in the morning -BuSpar 5 mg twice daily  Constipation -As needed MiraLAX and Senokot  Body mass index is 26.35 kg/m. Nutrition Problem: Inadequate oral intake Etiology: decreased appetite, nausea Signs/Symptoms: per patient/family report Interventions: MVI, Other (Comment) (Prosource Plus)   DVT  prophylaxis:  SCDs Start: 12/22/19 1610  Code Status: Full code Family Communication: Patient states no family member for update. Status is: Inpatient  Remains inpatient appropriate because:Unsafe d/c plan   Dispo: The patient is from: Home               Anticipated d/c is to: SNF              Anticipated d/c date is: 1 day              Patient currently is medically stable to d/c.       Consultants:  None   Sch Meds:  Scheduled Meds: . (feeding supplement) PROSource Plus  30 mL Oral BID BM  . busPIRone  5 mg Oral BID  . [START ON 12/28/2019] clonazepam  0.25 mg Oral q AM  . clonazePAM  0.5 mg Oral QHS  . cloNIDine  0.1 mg Oral BID  . feeding supplement (ENSURE ENLIVE)  237 mL Oral BID BM  . metoprolol tartrate  25 mg Oral BID  . multivitamin with minerals  1 tablet Oral Daily  . pantoprazole  40 mg Oral Daily   Continuous Infusions:  PRN Meds:.acetaminophen, ondansetron **OR** ondansetron (ZOFRAN) IV, polyethylene glycol, senna-docusate  Antimicrobials: Anti-infectives (From admission, onward)   None       I have personally reviewed the following labs and images: CBC: Recent Labs  Lab 12/22/19 0059 12/22/19 0059 12/23/19 0514 12/24/19 0531 12/25/19 0533 12/26/19 0501 12/27/19 0526  WBC 11.1*   < > 4.9 3.5* 4.2 4.2 5.7  NEUTROABS 9.2*  --   --  1.9 2.4 2.0  --   HGB 13.7   < > 11.4* 11.2* 12.7* 12.8* 12.3*  HCT 44.1   < > 33.3* 33.7* 37.7* 38.9* 37.0*  MCV 100.2*   < > 93.0 93.6 92.2 94.9 93.9  PLT 99*   < > 49* 42* 59* 98* 145*   < > = values in this interval not displayed.   BMP &GFR Recent Labs  Lab 12/23/19 0514 12/24/19 0531 12/25/19 0533 12/26/19 0501 12/27/19 0526  NA 131* 134* 135 137 139  K 3.1* 3.2* 3.8 3.8 3.9  CL 98 101 102 99 101  CO2 20* 21* 24 27 29   GLUCOSE 161* 127* 109* 116* 104*  BUN 28* 23 18 21  25*  CREATININE 1.42* 0.97 0.99 1.13 1.13  CALCIUM 8.1* 8.7* 9.3 9.0 8.8*  MG  --   --  1.6* 1.8 1.8  PHOS  --   --  1.0* 5.6* 3.2   Estimated Creatinine Clearance: 64.6 mL/min (by C-G formula based on SCr of 1.13 mg/dL). Liver & Pancreas: Recent Labs  Lab 12/22/19 0107 12/24/19 0531 12/25/19 0533 12/26/19 0501 12/27/19 0526  AST 69* 53* 65* 75* 63*  ALT 51* 42 58* 77* 76*   ALKPHOS 82 72 72 68 65  BILITOT 0.8 0.7 1.0 0.7 0.5  PROT 7.2 5.7* 6.5 6.2* 5.9*  ALBUMIN 3.9 3.1* 3.5 3.2* 3.2*   No results for input(s): LIPASE, AMYLASE in the last 168 hours. No results for input(s): AMMONIA in the last 168 hours. Diabetic: Recent Labs    12/26/19 0501  HGBA1C 5.3   No results for input(s): GLUCAP in the last 168 hours. Cardiac Enzymes: Recent Labs  Lab 12/26/19 1521  CKTOTAL 12*   No results for input(s): PROBNP in the last 8760 hours. Coagulation Profile: No results for input(s): INR, PROTIME in the last 168 hours. Thyroid Function Tests: No  results for input(s): TSH, T4TOTAL, FREET4, T3FREE, THYROIDAB in the last 72 hours. Lipid Profile: No results for input(s): CHOL, HDL, LDLCALC, TRIG, CHOLHDL, LDLDIRECT in the last 72 hours. Anemia Panel: Recent Labs    12/26/19 0501  VITAMINB12 440  FOLATE 12.6  FERRITIN 491*  TIBC 211*  IRON 44*  RETICCTPCT 1.8   Urine analysis:    Component Value Date/Time   COLORURINE YELLOW 12/22/2019 0626   APPEARANCEUR CLEAR 12/22/2019 0626   LABSPEC 1.017 12/22/2019 0626   PHURINE 5.0 12/22/2019 0626   GLUCOSEU NEGATIVE 12/22/2019 0626   HGBUR SMALL (A) 12/22/2019 0626   BILIRUBINUR NEGATIVE 12/22/2019 0626   KETONESUR 80 (A) 12/22/2019 0626   PROTEINUR 100 (A) 12/22/2019 0626   NITRITE NEGATIVE 12/22/2019 0626   LEUKOCYTESUR NEGATIVE 12/22/2019 0626   Sepsis Labs: Invalid input(s): PROCALCITONIN, Omar  Microbiology: Recent Results (from the past 240 hour(s))  SARS Coronavirus 2 by RT PCR (hospital order, performed in [facility] hospital lab) Nasopharyngeal Nasopharyngeal Swab     Status: None   Collection Time: 12/22/19  6:59 AM   Specimen: Nasopharyngeal Swab  Result Value Ref Range Status   SARS Coronavirus 2 NEGATIVE NEGATIVE Final    Comment: (NOTE) SARS-CoV-2 target nucleic acids are NOT DETECTED.  The SARS-CoV-2 RNA is generally detectable in upper and lower respiratory specimens  during the acute phase of infection. The lowest concentration of SARS-CoV-2 viral copies this assay can detect is 250 copies / mL. A negative result does not preclude SARS-CoV-2 infection and should not be used as the sole basis for treatment or other patient management decisions.  A negative result may occur with improper specimen collection / handling, submission of specimen other than nasopharyngeal swab, presence of viral mutation(s) within the areas targeted by this assay, and inadequate number of viral copies (<250 copies / mL). A negative result must be combined with clinical observations, patient history, and epidemiological information.  Fact Sheet for Patients:   StrictlyIdeas.no  Fact Sheet for Healthcare Providers: BankingDealers.co.za  This test is not yet approved or  cleared by the [place] FDA and has been authorized for detection and/or diagnosis of SARS-CoV-2 by FDA under an Emergency Use Authorization (EUA).  This EUA will remain in effect (meaning this test can be used) for the duration of the COVID-19 declaration under Section 564(b)(1) of the Act, 21 U.S.C. section 360bbb-3(b)(1), unless the authorization is terminated or revoked sooner.  Performed at [facility], [place] [address], [place], [place] [address]     Radiology Studies: No results found.    Montanna Mcbain T. Seymour  If 7PM-7AM, please contact night-coverage [email or web address] 12/27/2019, 3:02 PM

## 2019-12-28 LAB — COMPREHENSIVE METABOLIC PANEL
ALT: 71 U/L — ABNORMAL HIGH (ref 0–44)
AST: 49 U/L — ABNORMAL HIGH (ref 15–41)
Albumin: 3.2 g/dL — ABNORMAL LOW (ref 3.5–5.0)
Alkaline Phosphatase: 66 U/L (ref 38–126)
Anion gap: 10 (ref 5–15)
BUN: 28 mg/dL — ABNORMAL HIGH (ref 8–23)
CO2: 25 mmol/L (ref 22–32)
Calcium: 8.6 mg/dL — ABNORMAL LOW (ref 8.9–10.3)
Chloride: 101 mmol/L (ref 98–111)
Creatinine, Ser: 1.07 mg/dL (ref 0.61–1.24)
GFR calc Af Amer: >60 mL/min (ref >60)
GFR calc non Af Amer: >60 mL/min (ref >60)
Glucose, Bld: 135 mg/dL — ABNORMAL HIGH (ref 70–99)
Potassium: 3.4 mmol/L — ABNORMAL LOW (ref 3.5–5.1)
Sodium: 136 mmol/L (ref 135–145)
Total Bilirubin: 0.6 mg/dL (ref 0.3–1.2)
Total Protein: 6 g/dL — ABNORMAL LOW (ref 6.5–8.1)

## 2019-12-28 LAB — PHOSPHORUS: Phosphorus: 2.9 mg/dL (ref 2.5–4.6)

## 2019-12-28 LAB — MAGNESIUM: Magnesium: 1.8 mg/dL (ref 1.7–2.4)

## 2019-12-28 MED ORDER — ENOXAPARIN SODIUM 40 MG/0.4ML ~~LOC~~ SOLN: 40.0000 mg | SUBCUTANEOUS | Status: DC

## 2019-12-28 MED ORDER — POLYVINYL ALCOHOL 1.4 % OP SOLN
1.0000 [drp] | OPHTHALMIC | Status: DC | PRN
  Administered 2019-12-28: 1 [drp] via OPHTHALMIC
  Filled 2019-12-28: qty 15

## 2019-12-28 MED ORDER — CLONAZEPAM 1 MG PO TABS
1.0000 mg | ORAL_TABLET | Freq: Every day | ORAL | Status: DC
  Administered 2019-12-28 – 2019-12-29 (×2): 1 mg via ORAL
  Filled 2019-12-28 (×2): qty 1

## 2019-12-28 MED ORDER — CLONAZEPAM 0.125 MG PO TBDP: 0.5000 mg | ORAL_TABLET | Freq: Every morning | ORAL | Status: DC

## 2019-12-28 MED ORDER — MAGNESIUM CITRATE PO SOLN
1.0000 | Freq: Every day | ORAL | Status: DC | PRN
  Administered 2019-12-29: 1 via ORAL
  Filled 2019-12-28 (×2): qty 296

## 2019-12-28 MED ORDER — CLONAZEPAM 0.5 MG PO TABS
0.5000 mg | ORAL_TABLET | Freq: Every morning | ORAL | Status: DC
  Administered 2019-12-28 – 2019-12-30 (×3): 0.5 mg via ORAL
  Filled 2019-12-28 (×3): qty 1

## 2019-12-28 MED ORDER — POTASSIUM CHLORIDE CRYS ER 20 MEQ PO TBCR
40.0000 meq | EXTENDED_RELEASE_TABLET | ORAL | Status: AC
  Administered 2019-12-28 (×2): 40 meq via ORAL
  Filled 2019-12-28 (×2): qty 2

## 2019-12-28 MED ORDER — BUSPIRONE HCL 5 MG PO TABS
5.0000 mg | ORAL_TABLET | Freq: Three times a day (TID) | ORAL | Status: DC
  Administered 2019-12-28 – 2019-12-30 (×7): 5 mg via ORAL
  Filled 2019-12-28 (×7): qty 1

## 2019-12-28 NOTE — Progress Notes (Signed)
PROGRESS NOTE  Ruben [name] RJJ:[id number] DOB:    PCP: [name], MD  Patient is from: Home.  Lives alone.  Has walker at home but does not use.  DOA: 12/22/2019 LOS: 6  Brief Narrative / Interim history: 69 year old man with history of alcohol abuse, BPH, HTN and B12 deficiency presented to the hospital with alcohol withdrawal symptoms.  Reportedly binge drinking prior to presentation.    He was admitted for alcohol withdrawal, metabolic acidosis and AKI.  Started on IV fluid, Librium taper and CIWA with as needed Ativan.  AKI and metabolic acidosis resolved.  Patient was evaluated by therapy who recommended SNF.  Subjective: Seen and examined earlier this morning.  No major events overnight of this morning. He says he had a panic attack few minutes ago.  Reports feeling anxious.  He is wondering if he is still going through withdrawal.  No other complaints.  Objective: Vitals:   12/27/19 1344 12/27/19 2115 12/28/19 0500 12/28/19 1300  BP: 108/75 121/80 124/69 104/68  Pulse: 80 95 98 81  Resp: 14 14  16   Temp: 98.2 F (36.8 C) 97.9 F (36.6 C) 98.7 F (37.1 C) 97.6 F (36.4 C)  TempSrc: Oral Oral Oral Oral  SpO2: 98% 97% 98% 99%  Weight:      Height:        Intake/Output Summary (Last 24 hours) at 12/28/2019 1400 Last data filed at 12/28/2019 1300 Gross per 24 hour  Intake 1320 ml  Output 1460 ml  Net -140 ml   Filed Weights   12/22/19 1038 12/24/19 0225  Weight: 81.7 kg 83.3 kg    Examination:  GENERAL: No apparent distress.  Nontoxic. HEENT: MMM.  Vision and hearing grossly intact.  NECK: Supple.  No apparent JVD.  RESP:  No IWOB.  Fair aeration bilaterally. CVS:  RRR. Heart sounds normal.  ABD/GI/GU: BS+. Abd soft, NTND.  MSK/EXT:  Moves extremities. No apparent deformity. No edema.  SKIN: no apparent skin lesion or wound NEURO: Awake, alert and oriented appropriately.  No apparent focal neuro deficit. PSYCH: Somewhat  anxious.  Procedures:  None  Microbiology summarized: COVID-19 PCR negative.  Assessment & Plan: Alcohol abuse/alcohol withdrawal-reportedly binge drinking prior to presentation. -Completed Librium taper and as needed Ativan. -Continue clonidine -Continue Multivite, folic acid and thiamine  Elevated liver enzymes: Likely due to alcohol. Total bili within normal.  Acute hepatitis panel and HIV nonreactive.  No GI symptoms.  CK within normal.  Improved. -Continue monitoring.  Transient tachycardia with activity: Likely chronotropic response to exertion -Continue metoprolol and clonidine  Hypokalemia/hypomagnesemia/hypophosphatemia: Likely due to alcohol. -Monitor and replenish as appropriate  Hyperphosphatemia: Resolved. -Discontinue p.o. phosphorus  Anion gap metabolic acidosis: Likely due to alcohol.  Resolved.  Diplopia: Resolved.  PERRL and EOMI on exam.  Hyperglycemia without diagnosis of diabetes: Likely due to alcohol.  A1c 5.3%.  Leukocytosis: Resolved   Mild hyponatremia: could be from LR or alcohol.  Resolved. -Discontinue LR  Pancytopenia: Likely from alcohol.  Improving. -Continue monitoring -Optimize nutrition -Vitamins as above.  Debility/physical deconditioning -PT/OT recommended SNF.  Anxiety/insomnia-reports feeling anxious and difficulty sleeping. -Increase morning Klonopin to 0.5 mg and nightly Klonopin to 1 mg -Increase BuSpar to 5 mg 3 times daily  Constipation -As needed MiraLAX and Senokot  Body mass index is 26.35 kg/m. Nutrition Problem: Inadequate oral intake Etiology: decreased appetite, nausea Signs/Symptoms: per patient/family report Interventions: MVI, Other (Comment) (Prosource Plus)   DVT prophylaxis:  SCDs Start: 12/22/19 0752  Code  Status: Full code Family Communication: Patient states no family member for update. Status is: Inpatient  Remains inpatient appropriate because:Unsafe d/c plan   Dispo: The patient is  from: Home              Anticipated d/c is to: SNF              Anticipated d/c date is: 1 day              Patient currently is medically stable to d/c.       Consultants:  None   Sch Meds:  Scheduled Meds: . (feeding supplement) PROSource Plus  30 mL Oral BID BM  . busPIRone  5 mg Oral TID  . clonazePAM  0.5 mg Oral q AM  . clonazePAM  1 mg Oral QHS  . cloNIDine  0.1 mg Oral BID  . feeding supplement (ENSURE ENLIVE)  237 mL Oral BID BM  . metoprolol tartrate  25 mg Oral BID  . multivitamin with minerals  1 tablet Oral Daily  . pantoprazole  40 mg Oral Daily   Continuous Infusions:  PRN Meds:.acetaminophen, ondansetron **OR** ondansetron (ZOFRAN) IV, polyethylene glycol, polyvinyl alcohol, senna-docusate  Antimicrobials: Anti-infectives (From admission, onward)   None       I have personally reviewed the following labs and images: CBC: Recent Labs  Lab 12/22/19 0059 12/22/19 0059 12/23/19 0514 12/24/19 0531 12/25/19 0533 12/26/19 0501 12/27/19 0526  WBC 11.1*   < > 4.9 3.5* 4.2 4.2 5.7  NEUTROABS 9.2*  --   --  1.9 2.4 2.0  --   HGB 13.7   < > 11.4* 11.2* 12.7* 12.8* 12.3*  HCT 44.1   < > 33.3* 33.7* 37.7* 38.9* 37.0*  MCV 100.2*   < > 93.0 93.6 92.2 94.9 93.9  PLT 99*   < > 49* 42* 59* 98* 145*   < > = values in this interval not displayed.   BMP &GFR Recent Labs  Lab 12/24/19 0531 12/25/19 0533 12/26/19 0501 12/27/19 0526 12/28/19 0527  NA 134* 135 137 139 136  K 3.2* 3.8 3.8 3.9 3.4*  CL 101 102 99 101 101  CO2 21* 24 27 29 25   GLUCOSE 127* 109* 116* 104* 135*  BUN 23 18 21  25* 28*  CREATININE 0.97 0.99 1.13 1.13 1.07  CALCIUM 8.7* 9.3 9.0 8.8* 8.6*  MG  --  1.6* 1.8 1.8 1.8  PHOS  --  1.0* 5.6* 3.2 2.9   Estimated Creatinine Clearance: 68.2 mL/min (by C-G formula based on SCr of 1.07 mg/dL). Liver & Pancreas: Recent Labs  Lab 12/24/19 0531 12/25/19 0533 12/26/19 0501 12/27/19 0526 12/28/19 0527  AST 53* 65* 75* 63* 49*  ALT 42  58* 77* 76* 71*  ALKPHOS 72 72 68 65 66  BILITOT 0.7 1.0 0.7 0.5 0.6  PROT 5.7* 6.5 6.2* 5.9* 6.0*  ALBUMIN 3.1* 3.5 3.2* 3.2* 3.2*   No results for input(s): LIPASE, AMYLASE in the last 168 hours. No results for input(s): AMMONIA in the last 168 hours. Diabetic: Recent Labs    12/26/19 0501  HGBA1C 5.3   No results for input(s): GLUCAP in the last 168 hours. Cardiac Enzymes: Recent Labs  Lab 12/26/19 1521  CKTOTAL 12*   No results for input(s): PROBNP in the last 8760 hours. Coagulation Profile: No results for input(s): INR, PROTIME in the last 168 hours. Thyroid Function Tests: No results for input(s): TSH, T4TOTAL, FREET4, T3FREE, THYROIDAB in the last 72 hours.  Lipid Profile: No results for input(s): CHOL, HDL, LDLCALC, TRIG, CHOLHDL, LDLDIRECT in the last 72 hours. Anemia Panel: Recent Labs    12/26/19 0501  VITAMINB12 440  FOLATE 12.6  FERRITIN 491*  TIBC 211*  IRON 44*  RETICCTPCT 1.8   Urine analysis:    Component Value Date/Time   COLORURINE YELLOW 12/22/2019 0626   APPEARANCEUR CLEAR 12/22/2019 0626   LABSPEC 1.017 12/22/2019 0626   PHURINE 5.0 12/22/2019 0626   GLUCOSEU NEGATIVE 12/22/2019 0626   HGBUR SMALL (A) 12/22/2019 0626   BILIRUBINUR NEGATIVE 12/22/2019 0626   KETONESUR 80 (A) 12/22/2019 0626   PROTEINUR 100 (A) 12/22/2019 0626   NITRITE NEGATIVE 12/22/2019 0626   LEUKOCYTESUR NEGATIVE 12/22/2019 0626   Sepsis Labs: Invalid input(s): PROCALCITONIN, [place]  Microbiology: Recent Results (from the past 240 hour(s))  SARS Coronavirus 2 by RT PCR (hospital order, performed in [facility] hospital lab) Nasopharyngeal Nasopharyngeal Swab     Status: None   Collection Time: 12/22/19  6:59 AM   Specimen: Nasopharyngeal Swab  Result Value Ref Range Status   SARS Coronavirus 2 NEGATIVE NEGATIVE Final    Comment: (NOTE) SARS-CoV-2 target nucleic acids are NOT DETECTED.  The SARS-CoV-2 RNA is generally detectable in upper and  lower respiratory specimens during the acute phase of infection. The lowest concentration of SARS-CoV-2 viral copies this assay can detect is 250 copies / mL. A negative result does not preclude SARS-CoV-2 infection and should not be used as the sole basis for treatment or other patient management decisions.  A negative result may occur with improper specimen collection / handling, submission of specimen other than nasopharyngeal swab, presence of viral mutation(s) within the areas targeted by this assay, and inadequate number of viral copies (<250 copies / mL). A negative result must be combined with clinical observations, patient history, and epidemiological information.  Fact Sheet for Patients:   StrictlyIdeas.no  Fact Sheet for Healthcare Providers: BankingDealers.co.za  This test is not yet approved or  cleared by the [place] FDA and has been authorized for detection and/or diagnosis of SARS-CoV-2 by FDA under an Emergency Use Authorization (EUA).  This EUA will remain in effect (meaning this test can be used) for the duration of the COVID-19 declaration under Section 564(b)(1) of the Act, 21 U.S.C. section 360bbb-3(b)(1), unless the authorization is terminated or revoked sooner.  Performed at [facility], [address], [place], [place] [address]     Radiology Studies: No results found.    Lejend Dalby T. [place]  If 7PM-7AM, please contact night-coverage [email or web address] 12/28/2019, 2:00 PM

## 2019-12-29 LAB — RENAL FUNCTION PANEL
Albumin: 3.4 g/dL — ABNORMAL LOW (ref 3.5–5.0)
Anion gap: 9 (ref 5–15)
BUN: 27 mg/dL — ABNORMAL HIGH (ref 8–23)
CO2: 27 mmol/L (ref 22–32)
Calcium: 9.5 mg/dL (ref 8.9–10.3)
Chloride: 100 mmol/L (ref 98–111)
Creatinine, Ser: 1.18 mg/dL (ref 0.61–1.24)
GFR calc Af Amer: >60 mL/min (ref >60)
GFR calc non Af Amer: >60 mL/min (ref >60)
Glucose, Bld: 110 mg/dL — ABNORMAL HIGH (ref 70–99)
Phosphorus: 3.2 mg/dL (ref 2.5–4.6)
Potassium: 4.5 mmol/L (ref 3.5–5.1)
Sodium: 136 mmol/L (ref 135–145)

## 2019-12-29 LAB — CBC
HCT: 37.8 % — ABNORMAL LOW (ref 39.0–52.0)
Hemoglobin: 12.2 g/dL — ABNORMAL LOW (ref 13.0–17.0)
MCH: 31 pg (ref 26.0–34.0)
MCHC: 32.3 g/dL (ref 30.0–36.0)
MCV: 95.9 fL (ref 80.0–100.0)
Platelets: 233 10*3/uL (ref 150–400)
RBC: 3.94 MIL/uL — ABNORMAL LOW (ref 4.22–5.81)
RDW: 14.2 % (ref 11.5–15.5)
WBC: 5.2 10*3/uL (ref 4.0–10.5)
nRBC: 0 % (ref 0.0–0.2)

## 2019-12-29 LAB — MAGNESIUM: Magnesium: 1.8 mg/dL (ref 1.7–2.4)

## 2019-12-29 LAB — PHOSPHORUS: Phosphorus: 3.2 mg/dL (ref 2.5–4.6)

## 2019-12-29 NOTE — Progress Notes (Signed)
PROGRESS NOTE  Ruben [name] BSW:[id number] DOB: [birth year]   PCP: [name], MD  Patient is from: Home.  Lives alone.  Has walker at home but does not use.  DOA: 12/22/2019 LOS: 7  Brief Narrative / Interim history: 69 year old man with history of alcohol abuse, BPH, HTN and B12 deficiency presented to the hospital with alcohol withdrawal symptoms.  Reportedly binge drinking prior to presentation.    He was admitted for alcohol withdrawal, metabolic acidosis and AKI.  Started on IV fluid, Librium taper and CIWA with as needed Ativan.  AKI and metabolic acidosis resolved.  Patient was evaluated by therapy who recommended SNF.  Subjective: Seen and examined earlier this morning.  No major events overnight or this morning.  Anxiety seems to be fairly controlled with medication adjustment.  No complaints today.  Objective: Vitals:   12/28/19 1300 12/28/19 2113 12/29/19 0616 12/29/19 1302  BP: 104/68 112/82 115/69 115/75  Pulse: 81 78 76 84  Resp: 16 16 16 16   Temp: 97.6 F (36.4 C) 97.7 F (36.5 C) 98 F (36.7 C) 98.5 F (36.9 C)  TempSrc: Oral Oral Oral Oral  SpO2: 99% 99% 100% 99%  Weight:      Height:        Intake/Output Summary (Last 24 hours) at 12/29/2019 1553 Last data filed at 12/28/2019 2100 Gross per 24 hour  Intake --  Output 460 ml  Net -460 ml   Filed Weights   12/22/19 1038 12/24/19 0225  Weight: 81.7 kg 83.3 kg    Examination:  GENERAL: No apparent distress.  Nontoxic. HEENT: MMM.  Vision and hearing grossly intact.  NECK: Supple.  No apparent JVD.  RESP:  No IWOB.  Fair aeration bilaterally. CVS:  RRR. Heart sounds normal.  ABD/GI/GU: BS+. Abd soft, NTND.  MSK/EXT:  Moves extremities. No apparent deformity. No edema.  SKIN: no apparent skin lesion or wound NEURO: Awake, alert and oriented appropriately.  No apparent focal neuro deficit. PSYCH: Calm. Normal affect.  Procedures:  None  Microbiology summarized: COVID-19 PCR  negative.  Assessment & Plan: Alcohol abuse/alcohol withdrawal-reportedly binge drinking prior to presentation. -Completed Librium taper and as needed Ativan. -Continue clonidine -Continue Multivite, folic acid and thiamine  Elevated liver enzymes: Likely due to alcohol. Total bili within normal.  Acute hepatitis panel and HIV nonreactive.  No GI symptoms.  CK within normal.  Improved. -Continue monitoring intermittently  Transient tachycardia with activity: Likely chronotropic response to exertion -Continue metoprolol and clonidine  Hypokalemia/hypomagnesemia/hypophosphatemia: Likely due to alcohol.  Resolved.  Hyperphosphatemia: Resolved.  Anion gap metabolic acidosis: Likely due to alcohol.  Resolved.  Diplopia: Resolved.  PERRL and EOMI on exam.  Hyperglycemia without diagnosis of diabetes: Likely due to alcohol.  A1c 5.3%.  Leukocytosis: Resolved  Mild hyponatremia: could be from LR or alcohol.  Resolved.  Pancytopenia: Likely from alcohol.  Resolved except for mild normocytic anemia -Optimize nutrition -Vitamins as above.  Anxiety/insomnia-improved after medication adjustment. -Continue Klonopin 0.5 mg the morning and 1 mg at night -Continue BuSpar to 5 mg 3 times daily  Debility/physical deconditioning -PT/OT recommended SNF.  Constipation -As needed MiraLAX, Senokot-S and mag citrate based on severity  Body mass index is 26.35 kg/m. Nutrition Problem: Inadequate oral intake Etiology: decreased appetite, nausea Signs/Symptoms: per patient/family report Interventions: MVI, Other (Comment) (Prosource Plus)   DVT prophylaxis:  SCDs Start: 12/22/19 0752  Code Status: Full code Family Communication: Patient states no family member for update. Status is: Inpatient  Remains inpatient  appropriate because:Unsafe d/c plan   Dispo: The patient is from: Home              Anticipated d/c is to: SNF              Anticipated d/c date is: 1 day               Patient currently is medically stable to d/c.       Consultants:  None   Sch Meds:  Scheduled Meds: . (feeding supplement) PROSource Plus  30 mL Oral BID BM  . busPIRone  5 mg Oral TID  . clonazePAM  0.5 mg Oral q AM  . clonazePAM  1 mg Oral QHS  . cloNIDine  0.1 mg Oral BID  . feeding supplement (ENSURE ENLIVE)  237 mL Oral BID BM  . metoprolol tartrate  25 mg Oral BID  . multivitamin with minerals  1 tablet Oral Daily  . pantoprazole  40 mg Oral Daily   Continuous Infusions:  PRN Meds:.acetaminophen, magnesium citrate, ondansetron **OR** ondansetron (ZOFRAN) IV, polyethylene glycol, polyvinyl alcohol, senna-docusate  Antimicrobials: Anti-infectives (From admission, onward)   None       I have personally reviewed the following labs and images: CBC: Recent Labs  Lab 12/24/19 0531 12/25/19 0533 12/26/19 0501 12/27/19 0526 12/29/19 0351  WBC 3.5* 4.2 4.2 5.7 5.2  NEUTROABS 1.9 2.4 2.0  --   --   HGB 11.2* 12.7* 12.8* 12.3* 12.2*  HCT 33.7* 37.7* 38.9* 37.0* 37.8*  MCV 93.6 92.2 94.9 93.9 95.9  PLT 42* 59* 98* 145* 233   BMP &GFR Recent Labs  Lab 12/25/19 0533 12/26/19 0501 12/27/19 0526 12/28/19 0527 12/29/19 0351  NA 135 137 139 136 136  K 3.8 3.8 3.9 3.4* 4.5  CL 102 99 101 101 100  CO2 24 27 29 25 27   GLUCOSE 109* 116* 104* 135* 110*  BUN 18 21 25* 28* 27*  CREATININE 0.99 1.13 1.13 1.07 1.18  CALCIUM 9.3 9.0 8.8* 8.6* 9.5  MG 1.6* 1.8 1.8 1.8 1.8  PHOS 1.0* 5.6* 3.2 2.9 3.2  3.2   Estimated Creatinine Clearance: 61.9 mL/min (by C-G formula based on SCr of 1.18 mg/dL). Liver & Pancreas: Recent Labs  Lab 12/24/19 0531 12/24/19 0531 12/25/19 0533 12/26/19 0501 12/27/19 0526 12/28/19 0527 12/29/19 0351  AST 53*  --  65* 75* 63* 49*  --   ALT 42  --  58* 77* 76* 71*  --   ALKPHOS 72  --  72 68 65 66  --   BILITOT 0.7  --  1.0 0.7 0.5 0.6  --   PROT 5.7*  --  6.5 6.2* 5.9* 6.0*  --   ALBUMIN 3.1*   < > 3.5 3.2* 3.2* 3.2* 3.4*   < > =  values in this interval not displayed.   No results for input(s): LIPASE, AMYLASE in the last 168 hours. No results for input(s): AMMONIA in the last 168 hours. Diabetic: No results for input(s): HGBA1C in the last 72 hours. No results for input(s): GLUCAP in the last 168 hours. Cardiac Enzymes: Recent Labs  Lab 12/26/19 1521  CKTOTAL 12*   No results for input(s): PROBNP in the last 8760 hours. Coagulation Profile: No results for input(s): INR, PROTIME in the last 168 hours. Thyroid Function Tests: No results for input(s): TSH, T4TOTAL, FREET4, T3FREE, THYROIDAB in the last 72 hours. Lipid Profile: No results for input(s): CHOL, HDL, LDLCALC, TRIG, CHOLHDL, LDLDIRECT in the  last 72 hours. Anemia Panel: No results for input(s): VITAMINB12, FOLATE, FERRITIN, TIBC, IRON, RETICCTPCT in the last 72 hours. Urine analysis:    Component Value Date/Time   COLORURINE YELLOW 12/22/2019 0626   APPEARANCEUR CLEAR 12/22/2019 0626   LABSPEC 1.017 12/22/2019 0626   PHURINE 5.0 12/22/2019 0626   GLUCOSEU NEGATIVE 12/22/2019 0626   HGBUR SMALL (A) 12/22/2019 0626   BILIRUBINUR NEGATIVE 12/22/2019 0626   KETONESUR 80 (A) 12/22/2019 0626   PROTEINUR 100 (A) 12/22/2019 0626   NITRITE NEGATIVE 12/22/2019 0626   LEUKOCYTESUR NEGATIVE 12/22/2019 0626   Sepsis Labs: Invalid input(s): PROCALCITONIN, [place]  Microbiology: Recent Results (from the past 240 hour(s))  SARS Coronavirus 2 by RT PCR (hospital order, performed in [facility] hospital lab) Nasopharyngeal Nasopharyngeal Swab     Status: None   Collection Time: 12/22/19  6:59 AM   Specimen: Nasopharyngeal Swab  Result Value Ref Range Status   SARS Coronavirus 2 NEGATIVE NEGATIVE Final    Comment: (NOTE) SARS-CoV-2 target nucleic acids are NOT DETECTED.  The SARS-CoV-2 RNA is generally detectable in upper and lower respiratory specimens during the acute phase of infection. The lowest concentration of SARS-CoV-2 viral copies this  assay can detect is 250 copies / mL. A negative result does not preclude SARS-CoV-2 infection and should not be used as the sole basis for treatment or other patient management decisions.  A negative result may occur with improper specimen collection / handling, submission of specimen other than nasopharyngeal swab, presence of viral mutation(s) within the areas targeted by this assay, and inadequate number of viral copies (<250 copies / mL). A negative result must be combined with clinical observations, patient history, and epidemiological information.  Fact Sheet for Patients:   StrictlyIdeas.no  Fact Sheet for Healthcare Providers: BankingDealers.co.za  This test is not yet approved or  cleared by the [place] FDA and has been authorized for detection and/or diagnosis of SARS-CoV-2 by FDA under an Emergency Use Authorization (EUA).  This EUA will remain in effect (meaning this test can be used) for the duration of the COVID-19 declaration under Section 564(b)(1) of the Act, 21 U.S.C. section 360bbb-3(b)(1), unless the authorization is terminated or revoked sooner.  Performed at [facility], [place] [address] [place],  [address]     Radiology Studies: No results found.    Makenlee Mckeag T. [place]  If 7PM-7AM, please contact night-coverage [email or web address] 12/29/2019, 3:53 PM

## 2019-12-29 NOTE — Care Management Important Message (Signed)
Important Message  Patient Details  Name: Ruben [name] MRN: [id number] Date of Birth: [birth year]   Medicare Important Message Given:  Yes (Medicare IM printed for Evette Cristal, to give to the patient)     Crista Luria 12/29/2019, 10:59 AM

## 2019-12-29 NOTE — Progress Notes (Signed)
Physical Therapy Treatment Patient Details Name: MAOR MECKEL MRN: [id number] DOB: 1951/02/14 Today's Date: 12/29/2019    History of Present Illness Pt is 69 y.o. male with medical history significant for alcoholism, aortic atherosclerosis, BPH, GERD who presents to the ER for evaluation of weakness and nausea and admitted for alcohol withdrawal    PT Comments    Pt used bathroom without assist, performed a couple balance exercises and ambulated in hallway.  Pt with no LOB and pt did not use RW today for ambulation.  Updated d/c recommendations to HHPT.   Follow Up Recommendations  Home health PT;Supervision - Intermittent     Equipment Recommendations  None recommended by PT    Recommendations for Other Services       Precautions / Restrictions Precautions Precautions: Fall;Other (comment) Precaution Comments: seizures    Mobility  Bed Mobility Overal bed mobility: Modified Independent                Transfers Overall transfer level: Needs assistance Equipment used: None Transfers: Sit to/from Stand Sit to Stand: Min guard            Ambulation/Gait Ambulation/Gait assistance: Min guard Gait Distance (Feet): 400 Feet Assistive device: Rolling walker (2 wheeled) Gait Pattern/deviations: Step-through pattern;Trunk flexed;Wide base of support     General Gait Details: slightly wide BOS however no LOB observed, did not use assistive device today so min/guard provided for safety   Stairs             Wheelchair Mobility    Modified Rankin (Stroke Patients Only)       Balance           Standing balance support: No upper extremity supported Standing balance-Leahy Scale: Fair   Single Leg Stance - Right Leg: 13 Single Leg Stance - Left Leg: 7           High Level Balance Comments: performed a couple of heel raises with UE support, pt attempting SLS exercise as well however reports he does not perform those well and stopped             Cognition Arousal/Alertness: Awake/alert Behavior During Therapy: WFL for tasks assessed/performed Overall Cognitive Status: Within Functional Limits for tasks assessed                                        Exercises      General Comments        Pertinent Vitals/Pain Pain Assessment: No/denies pain    Home Living                      Prior Function            PT Goals (current goals can now be found in the care plan section) Progress towards PT goals: Progressing toward goals    Frequency    Min 2X/week      PT Plan Discharge plan needs to be updated    Co-evaluation              AM-PAC PT "6 Clicks" Mobility   Outcome Measure  Help needed turning from your back to your side while in a flat bed without using bedrails?: None Help needed moving from lying on your back to sitting on the side of a flat bed without using bedrails?: None Help needed moving to and from a bed  to a chair (including a wheelchair)?: A Little Help needed standing up from a chair using your arms (e.g., wheelchair or bedside chair)?: A Little Help needed to walk in hospital room?: A Little Help needed climbing 3-5 steps with a railing? : A Little 6 Click Score: 20    End of Session Equipment Utilized During Treatment: Gait belt Activity Tolerance: Patient tolerated treatment well Patient left: with call bell/phone within reach;in bed Nurse Communication: Mobility status PT Visit Diagnosis: Other abnormalities of gait and mobility (R26.89)     Time: 1601-0932 PT Time Calculation (min) (ACUTE ONLY): 13 min  Charges:  $Gait Training: 8-22 mins                    Arlyce Dice, DPT Acute Rehabilitation Services Pager: [phone] Office: [phone]   York Ram E 12/29/2019, 12:52 PM

## 2019-12-29 NOTE — TOC Progression Note (Signed)
Transition of Care [facility]) - Progression Note    Patient Details  Name: VENUS RUHE MRN: [id number] Date of Birth: [birth year]  Transition of Care [facility]) CM/SW Contact  Ross Ludwig, [place] Phone Number: 12/29/2019, 4:45 PM  Clinical Narrative:     CSW presented bed offers to patient.  He only had one bed offer, [facility].  CSW contacted [facility] and spoke to [place], she said they can accept patient tomorrow.  CSW updated physician, and requested a new Covid screen.  CSW awaiting for results and patient should be able to discharge tomorrow to [facility].       Expected Discharge Plan and [facility].                                               Social Determinants of Health (SDOH) Interventions    Readmission Risk Interventions No flowsheet data found.

## 2019-12-29 NOTE — Plan of Care (Signed)

## 2019-12-29 NOTE — TOC Progression Note (Signed)
Transition of Care [facility]) - Progression Note    Patient Details  Name: Ruben [name] MRN: [id number] Date of Birth: 1950/05/23  Transition of Care [facility]) CM/SW Contact  Ross Ludwig,  Phone Number: 12/29/2019, 11:36 AM  Clinical Narrative:     CSW spoke to patient and his cousin [name] on the phone [phone].  Patient stated initially that he would like to go home with home health, but he is open to going to a SNF for short term rehab.  Patient however still does not have any bed offers.  CSW explained the difference between home health and SNF placement.  Patient has been doing well with therapy and CSW explained that if he goes to a facility, he may not be there very long it just depends on what he qualifies for.  Patient asked CSW about home health services and how often they will be there to see him, CSW informed him it will depend on how much he qualifies for.  CSW explained the process of having a home health agency evaluate him and then they will decide how often he is able to be seen.  Patient stated he does not need any equipment and has a walker at home.  Patient stated he will think about [facility] SNF, and let CSW know later this afternoon.    Expected Discharge Plan and Services  Patient is considering SNF                                               Social Determinants of Health (SDOH) Interventions    Readmission Risk Interventions No flowsheet data found.

## 2019-12-30 DIAGNOSIS — R2689 Other abnormalities of gait and mobility: Secondary | ICD-10-CM | POA: Diagnosis present

## 2019-12-30 DIAGNOSIS — F10230 Alcohol dependence with withdrawal, uncomplicated: Secondary | ICD-10-CM | POA: Diagnosis not present

## 2019-12-30 DIAGNOSIS — K5900 Constipation, unspecified: Secondary | ICD-10-CM | POA: Diagnosis present

## 2019-12-30 DIAGNOSIS — M6281 Muscle weakness (generalized): Secondary | ICD-10-CM | POA: Diagnosis present

## 2019-12-30 DIAGNOSIS — R41841 Cognitive communication deficit: Secondary | ICD-10-CM | POA: Diagnosis present

## 2019-12-30 DIAGNOSIS — D696 Thrombocytopenia, unspecified: Secondary | ICD-10-CM

## 2019-12-30 DIAGNOSIS — K219 Gastro-esophageal reflux disease without esophagitis: Secondary | ICD-10-CM | POA: Diagnosis not present

## 2019-12-30 DIAGNOSIS — R000 Tachycardia, unspecified: Secondary | ICD-10-CM | POA: Diagnosis present

## 2019-12-30 DIAGNOSIS — R2681 Unsteadiness on feet: Secondary | ICD-10-CM | POA: Diagnosis present

## 2019-12-30 DIAGNOSIS — F10139 Alcohol abuse with withdrawal, unspecified: Secondary | ICD-10-CM | POA: Diagnosis present

## 2019-12-30 DIAGNOSIS — I10 Essential (primary) hypertension: Secondary | ICD-10-CM | POA: Diagnosis not present

## 2019-12-30 DIAGNOSIS — E872 Acidosis: Secondary | ICD-10-CM | POA: Diagnosis present

## 2019-12-30 LAB — SARS CORONAVIRUS 2 (TAT 6-24 HRS): SARS Coronavirus 2: NEGATIVE

## 2019-12-30 MED ORDER — ADULT MULTIVITAMIN W/MINERALS CH: 1.0000 | ORAL_TABLET | Freq: Every day | ORAL | Status: DC

## 2019-12-30 MED ORDER — CLONAZEPAM 0.5 MG PO TABS: 0.5000 mg | ORAL_TABLET | Freq: Two times a day (BID) | ORAL | 0 refills | Status: DC

## 2019-12-30 MED ORDER — SENNOSIDES-DOCUSATE SODIUM 8.6-50 MG PO TABS: 1.0000 | ORAL_TABLET | Freq: Two times a day (BID) | ORAL | Status: DC | PRN

## 2019-12-30 MED ORDER — AMLODIPINE BESYLATE 5 MG PO TABS: 5.0000 mg | ORAL_TABLET | Freq: Every day | ORAL | 11 refills | Status: DC

## 2019-12-30 MED ORDER — POLYETHYLENE GLYCOL 3350 17 G PO PACK: 17.0000 g | PACK | Freq: Two times a day (BID) | ORAL | 0 refills | Status: DC | PRN

## 2019-12-30 MED ORDER — ENSURE ENLIVE PO LIQD: 237.0000 mL | Freq: Two times a day (BID) | ORAL | 12 refills | Status: DC

## 2019-12-30 MED ORDER — BUSPIRONE HCL 5 MG PO TABS: 5.0000 mg | ORAL_TABLET | Freq: Three times a day (TID) | ORAL | Status: DC

## 2019-12-30 MED ORDER — MAGNESIUM CITRATE PO SOLN: 1.0000 | Freq: Every day | ORAL | Status: DC | PRN

## 2019-12-30 NOTE — Progress Notes (Signed)
Nutrition Follow-up  RD working remotely.  DOCUMENTATION CODES:   Not applicable  INTERVENTION:  - continue Ensure Enlive BID and 30 ml Prosource Plus BID.   NUTRITION DIAGNOSIS:   Inadequate oral intake related to decreased appetite, nausea as evidenced by per patient/family report. -ongoing, improving  GOAL:   Patient will meet greater than or equal to 90% of their needs -variably met  MONITOR:   PO intake, Supplement acceptance, Labs, Weight trends  ASSESSMENT:   69 y.o. male with medical history of alcohol abuse, aortic atherosclerosis, BPH, and GERD. He presented to the ED for evaluation of weakness, nausea, and poor appetite for several days. He reported binge drinking over the weekend (9/11 weekend). He then called EMS because he noted some scaling of the skin around his fingers and toes. Patient drinks 12-15 oz of liquor/day.  Intakes have been 40-100% over the past 1 week. He has been accepting Ensure ~75% of the time offered and Prosource 100% of the time offered. Last weight was on 9/15 which was slightly above admission (9/13) weight.   Discharge order and summary for discharge to SNF entered this AM.    Labs reviewed; BUN: 27 mg/dl. Medications reviewed; 1 tablet multivitamin with minerals/day.     NUTRITION - FOCUSED PHYSICAL EXAM:  unable to complete at this time.  Diet Order:   Diet Order            Diet - low sodium heart healthy           Diet 2 gram sodium Room service appropriate? Yes; Fluid consistency: Thin  Diet effective now                 EDUCATION NEEDS:   No education needs have been identified at this time  Skin:  Skin Assessment: Reviewed RN Assessment  Last BM:  9/20  Height:   Ht Readings from Last 1 Encounters:  12/22/19 5' 10"  (1.778 m)    Weight:   Wt Readings from Last 1 Encounters:  12/24/19 83.3 kg     Estimated Nutritional Needs:  Kcal:  1880-2045 kcal Protein:  80-90 grams Fluid:  >/= 2.2  L/day     Jarome Matin, MS, RD, LDN, CNSC Inpatient Clinical Dietitian RD pager # available in AMION  After hours/weekend pager # available in [facility]

## 2019-12-30 NOTE — TOC Transition Note (Signed)
Transition of Care [facility]) - CM/SW Discharge Note   Patient Details  Name: Ruben [name] MRN: [id number] Date of Birth: 1950-12-11  Transition of Care [facility]) CM/SW Contact:  Ross Ludwig, LCSW Phone Number: 12/30/2019, 11:45 AM   Clinical Narrative:     Patient to be d/c'ed today to [facility]. Patient and family agreeable to plans will transport via private vehicle RN to call report to [phone].  Patient's cousin will be transporting patient to SNF.  Final next level of care: Skilled Nursing Facility Barriers to Discharge: Barriers Resolved   Patient Goals and CMS Choice Patient states their goals for this hospitalization and ongoing recovery are:: To go to SNF then return back home with home health services. CMS Medicare.gov Compare Post Acute Care list provided to:: Patient Choice offered to / list presented to : Patient  Discharge Placement PASRR number recieved: 12/27/19            Patient chooses bed at: Other - please specify in the comment section below: [facility] SNF) Patient to be transferred to facility by: Family Name of family member notified: Patient's cousing is picking him up and aware that he is discharging today. Patient and family notified of of transfer: 12/30/19  Discharge Plan and Services                  DME Agency: NA       HH Arranged: NA          Social Determinants of Health (SDOH) Interventions     Readmission Risk Interventions No flowsheet data found.

## 2019-12-30 NOTE — Discharge Summary (Signed)
Physician Discharge Summary  Ruben [name]:[id number] DOB: 1950/04/30 DOA: 12/22/2019  PCP: [name], MD  Admit date: 12/22/2019 Discharge date: 12/30/2019  Admitted From: Home Disposition: SNF  Recommendations for Outpatient Follow-up:  1. Follow up in 1 week 2. Please obtain CBC/CMP/Mag at follow up 3. Please follow up on the following pending results: None  Discharge Condition: Stable full code CODE STATUS: Full code   Hospital Course: 69 year old man with history of alcohol abuse, BPH, HTN and B12 deficiency presented to the hospital with alcohol withdrawal symptoms.  Reportedly binge drinking prior to presentation.    He was admitted for alcohol withdrawal, metabolic acidosis and AKI.  Started on IV fluid, Librium taper and CIWA with as needed Ativan.  AKI and metabolic acidosis resolved.  Patient had persistent anxiety.  He was started on Klonopin and BuSpar.  Patient was evaluated by therapy who recommended SNF.  See individual problem list below for more hospital course.  Discharge Diagnoses:  Alcohol abuse/alcohol withdrawal-reportedly binge drinking prior to presentation. -Completed Librium taper and as needed Ativan. -Continue multivitamin  Elevated liver enzymes: Likely due to alcohol. Total bili within normal.  Acute hepatitis panel and HIV nonreactive.  No GI symptoms.  CK within normal.  Improved. -Recheck CMP in 1 week  Transient tachycardia with activity: Likely chronotropic response to exertion -Continue metoprolol  Essential hypertension: Normotensive. -Continue metoprolol 25 mg twice daily -Amlodipine 5 mg daily  Hypokalemia/hypomagnesemia/hypophosphatemia: Likely due to alcohol.  Resolved.  Hyperphosphatemia: Resolved.  Anion gap metabolic acidosis: Likely due to alcohol.  Resolved.  Diplopia: Resolved.  PERRL and EOMI on exam.  Hyperglycemia without diagnosis of diabetes: Likely due to alcohol.  A1c 5.3%.  Leukocytosis:  Resolved  Mild hyponatremia: could be from LR or alcohol.  Resolved.  Pancytopenia: Likely from alcohol.  Resolved except for mild normocytic anemia -Optimize nutrition -Recheck CBC in 1 week  Anxiety/insomnia-improved after medication adjustment. -Started on Klonopin 0.5 mg twice daily and BuSpar 5 mg 3 times daily  Debility/physical deconditioning -PT/OT recommended SNF.  Constipation -As needed MiraLAX, Senokot-S and mag citrate based on severity   Body mass index is 26.35 kg/m. Nutrition Problem: Inadequate oral intake Etiology: decreased appetite, nausea Signs/Symptoms: per patient/family report Interventions: MVI, Other (Comment) (Prosource Plus)      Discharge Exam: Vitals:   12/29/19 2146 12/30/19 0451  BP: 101/65 124/82  Pulse: 74 69  Resp: 18 20  Temp: 98.2 F (36.8 C) 97.8 F (36.6 C)  SpO2: 98% 99%    GENERAL: No apparent distress.  Nontoxic. HEENT: MMM.  Vision and hearing grossly intact.  NECK: Supple.  No apparent JVD.  RESP:  No IWOB.  Fair aeration bilaterally. CVS:  RRR. Heart sounds normal.  ABD/GI/GU: Bowel sounds present. Soft. Non tender.  MSK/EXT:  Moves extremities. No apparent deformity. No edema.  SKIN: no apparent skin lesion or wound NEURO: Awake, alert and oriented appropriately.  No apparent focal neuro deficit. PSYCH: Calm. Normal affect.  Discharge Instructions  Discharge Instructions    Diet - low sodium heart healthy   Complete by: As directed    Increase activity slowly   Complete by: As directed      Allergies as of 12/30/2019   No Known Allergies     Medication List    STOP taking these medications   traZODone 50 MG tablet Commonly known as: DESYREL     TAKE these medications   amLODipine 5 MG tablet Commonly known as: NORVASC Take 1 tablet (  5 mg total) by mouth daily.   atorvastatin 20 MG tablet Commonly known as: LIPITOR Take 1 tablet (20 mg total) by mouth daily.   busPIRone 5 MG  tablet Commonly known as: BUSPAR Take 1 tablet (5 mg total) by mouth 3 (three) times daily.   clonazePAM 0.5 MG tablet Commonly known as: KLONOPIN Take 1 tablet (0.5 mg total) by mouth 2 (two) times daily.   feeding supplement (ENSURE ENLIVE) Liqd Take 237 mLs by mouth 2 (two) times daily between meals.   magnesium citrate Soln Take 296 mLs (1 Bottle total) by mouth daily as needed for severe constipation.   metoprolol tartrate 25 MG tablet Commonly known as: LOPRESSOR Take 1 tablet (25 mg total) by mouth 2 (two) times daily.   multivitamin with minerals Tabs tablet Take 1 tablet by mouth daily.   polyethylene glycol 17 g packet Commonly known as: MIRALAX / GLYCOLAX Take 17 g by mouth 2 (two) times daily as needed for mild constipation.   senna-docusate 8.6-50 MG tablet Commonly known as: Senokot-S Take 1 tablet by mouth 2 (two) times daily as needed for moderate constipation.       Consultations:  None  Procedures/Studies:   CT HEAD WO CONTRAST  Result Date: 12/23/2019 CLINICAL DATA:  Headache, classic migraine features EXAM: CT HEAD WITHOUT CONTRAST TECHNIQUE: Contiguous axial images were obtained from the base of the skull through the vertex without intravenous contrast. COMPARISON:  CT head 10/01/2019 FINDINGS: Brain: No evidence of acute infarction, hemorrhage, hydrocephalus, extra-axial collection, visible mass lesion or mass effect. Symmetric prominence of the ventricles, cisterns and sulci compatible with parenchymal volume loss. Patchy areas of white matter hypoattenuation are most compatible with chronic microvascular angiopathy. Vascular: Atherosclerotic calcification of the carotid siphons and intradural vertebral arteries. No hyperdense vessel. Skull: No calvarial fracture or suspicious osseous lesion. No scalp swelling or hematoma. Few benign dermal calcifications. Chronic right high parietal scalp infiltration/scarring. Sinuses/Orbits: Nodular mural thickening  in the right frontal sinus and ethmoid air cells. Remaining paranasal sinuses and mastoid air cells are clear without pneumatized secretions or air-fluid levels. Middle ear cavities are clear. Debris noted in the bilateral external auditory canals. Included orbital structures are unremarkable. Other: None IMPRESSION: 1. No acute intracranial findings. 2. Mild parenchymal volume loss and chronic microvascular angiopathy. 3. Intracranial atherosclerosis. 4. Debris in the bilateral external auditory canals, correlate for cerumen impaction. Electronically Signed   By: Lovena Le M.D.   On: 12/23/2019 17:56        The results of significant diagnostics from this hospitalization (including imaging, microbiology, ancillary and laboratory) are listed below for reference.     Microbiology: Recent Results (from the past 240 hour(s))  SARS Coronavirus 2 by RT PCR (hospital order, performed in [facility] hospital lab) Nasopharyngeal Nasopharyngeal Swab     Status: None   Collection Time: 12/22/19  6:59 AM   Specimen: Nasopharyngeal Swab  Result Value Ref Range Status   SARS Coronavirus 2 NEGATIVE NEGATIVE Final    Comment: (NOTE) SARS-CoV-2 target nucleic acids are NOT DETECTED.  The SARS-CoV-2 RNA is generally detectable in upper and lower respiratory specimens during the acute phase of infection. The lowest concentration of SARS-CoV-2 viral copies this assay can detect is 250 copies / mL. A negative result does not preclude SARS-CoV-2 infection and should not be used as the sole basis for treatment or other patient management decisions.  A negative result may occur with improper specimen collection / handling, submission of specimen other than  nasopharyngeal swab, presence of viral mutation(s) within the areas targeted by this assay, and inadequate number of viral copies (<250 copies / mL). A negative result must be combined with clinical observations, patient history, and epidemiological  information.  Fact Sheet for Patients:   StrictlyIdeas.no  Fact Sheet for Healthcare Providers: BankingDealers.co.za  This test is not yet approved or  cleared by the [place] FDA and has been authorized for detection and/or diagnosis of SARS-CoV-2 by FDA under an Emergency Use Authorization (EUA).  This EUA will remain in effect (meaning this test can be used) for the duration of the COVID-19 declaration under Section 564(b)(1) of the Act, 21 U.S.C. section 360bbb-3(b)(1), unless the authorization is terminated or revoked sooner.  Performed at [facility], [place] [address], [place], [place] [address]   SARS CORONAVIRUS 2 (TAT 6-24 HRS) Nasopharyngeal Nasopharyngeal Swab     Status: None   Collection Time: 12/29/19  6:35 PM   Specimen: Nasopharyngeal Swab  Result Value Ref Range Status   SARS Coronavirus 2 NEGATIVE NEGATIVE Final    Comment: (NOTE) SARS-CoV-2 target nucleic acids are NOT DETECTED.  The SARS-CoV-2 RNA is generally detectable in upper and lower respiratory specimens during the acute phase of infection. Negative results do not preclude SARS-CoV-2 infection, do not rule out co-infections with other pathogens, and should not be used as the sole basis for treatment or other patient management decisions. Negative results must be combined with clinical observations, patient history, and epidemiological information. The expected result is Negative.  Fact Sheet for Patients: SugarRoll.be  Fact Sheet for Healthcare Providers: https://www.woods-mathews.com/  This test is not yet approved or cleared by the [place] FDA and  has been authorized for detection and/or diagnosis of SARS-CoV-2 by FDA under an Emergency Use Authorization (EUA). This EUA will remain  in effect (meaning this test can be used) for the duration of the COVID-19 declaration under Se  ction 564(b)(1) of the Act, 21 U.S.C. section 360bbb-3(b)(1), unless the authorization is terminated or revoked sooner.  Performed at [facility], [place] [address], [place], [place] [address]      Labs: BNP (last 3 results) No results for input(s): BNP in the last 8760 hours. Basic Metabolic Panel: Recent Labs  Lab 12/25/19 0533 12/26/19 0501 12/27/19 0526 12/28/19 0527 12/29/19 0351  NA 135 137 139 136 136  K 3.8 3.8 3.9 3.4* 4.5  CL 102 99 101 101 100  CO2 24 27 29 25 27   GLUCOSE 109* 116* 104* 135* 110*  BUN 18 21 25* 28* 27*  CREATININE 0.99 1.13 1.13 1.07 1.18  CALCIUM 9.3 9.0 8.8* 8.6* 9.5  MG 1.6* 1.8 1.8 1.8 1.8  PHOS 1.0* 5.6* 3.2 2.9 3.2  3.2   Liver Function Tests: Recent Labs  Lab 12/24/19 0531 12/24/19 0531 12/25/19 0533 12/26/19 0501 12/27/19 0526 12/28/19 0527 12/29/19 0351  AST 53*  --  65* 75* 63* 49*  --   ALT 42  --  58* 77* 76* 71*  --   ALKPHOS 72  --  72 68 65 66  --   BILITOT 0.7  --  1.0 0.7 0.5 0.6  --   PROT 5.7*  --  6.5 6.2* 5.9* 6.0*  --   ALBUMIN 3.1*   < > 3.5 3.2* 3.2* 3.2* 3.4*   < > = values in this interval not displayed.   No results for input(s): LIPASE, AMYLASE in the last 168 hours. No results for input(s): AMMONIA  in the last 168 hours. CBC: Recent Labs  Lab 12/24/19 0531 12/25/19 0533 12/26/19 0501 12/27/19 0526 12/29/19 0351  WBC 3.5* 4.2 4.2 5.7 5.2  NEUTROABS 1.9 2.4 2.0  --   --   HGB 11.2* 12.7* 12.8* 12.3* 12.2*  HCT 33.7* 37.7* 38.9* 37.0* 37.8*  MCV 93.6 92.2 94.9 93.9 95.9  PLT 42* 59* 98* 145* 233   Cardiac Enzymes: Recent Labs  Lab 12/26/19 1521  CKTOTAL 12*   BNP: Invalid input(s): POCBNP CBG: No results for input(s): GLUCAP in the last 168 hours. D-Dimer No results for input(s): DDIMER in the last 72 hours. Hgb A1c No results for input(s): HGBA1C in the last 72 hours. Lipid Profile No results for input(s): CHOL, HDL, LDLCALC, TRIG, CHOLHDL, LDLDIRECT in the last 72  hours. Thyroid function studies No results for input(s): TSH, T4TOTAL, T3FREE, THYROIDAB in the last 72 hours.  Invalid input(s): FREET3 Anemia work up No results for input(s): VITAMINB12, FOLATE, FERRITIN, TIBC, IRON, RETICCTPCT in the last 72 hours. Urinalysis    Component Value Date/Time   COLORURINE YELLOW 12/22/2019 0626   APPEARANCEUR CLEAR 12/22/2019 0626   LABSPEC 1.017 12/22/2019 0626   PHURINE 5.0 12/22/2019 0626   GLUCOSEU NEGATIVE 12/22/2019 0626   HGBUR SMALL (A) 12/22/2019 0626   BILIRUBINUR NEGATIVE 12/22/2019 0626   KETONESUR 80 (A) 12/22/2019 0626   PROTEINUR 100 (A) 12/22/2019 0626   NITRITE NEGATIVE 12/22/2019 0626   LEUKOCYTESUR NEGATIVE 12/22/2019 0626   Sepsis Labs Invalid input(s): PROCALCITONIN,  WBC,  LACTICIDVEN   Time coordinating discharge: 35 minutes  SIGNED:  Mercy Riding, MD  [facility] 12/30/2019, 8:06 AM  If 7PM-7AM, please contact night-coverage [email or web address]

## 2019-12-30 NOTE — Progress Notes (Signed)
Patient was given discharge instructions and prescription. Patient going to [place] for rehab and is providing his own transportation. Report was called to [phone]. Message left after 3 attempted calls to RN.

## 2020-01-14 DIAGNOSIS — E782 Mixed hyperlipidemia: Secondary | ICD-10-CM | POA: Diagnosis not present

## 2020-01-14 DIAGNOSIS — N400 Enlarged prostate without lower urinary tract symptoms: Secondary | ICD-10-CM | POA: Diagnosis not present

## 2020-01-14 DIAGNOSIS — E7800 Pure hypercholesterolemia, unspecified: Secondary | ICD-10-CM | POA: Diagnosis not present

## 2020-01-14 DIAGNOSIS — I10 Essential (primary) hypertension: Secondary | ICD-10-CM | POA: Diagnosis not present

## 2020-01-19 DIAGNOSIS — R457 State of emotional shock and stress, unspecified: Secondary | ICD-10-CM | POA: Diagnosis not present

## 2020-01-19 DIAGNOSIS — F29 Unspecified psychosis not due to a substance or known physiological condition: Secondary | ICD-10-CM | POA: Diagnosis not present

## 2020-01-20 DIAGNOSIS — F10139 Alcohol abuse with withdrawal, unspecified: Secondary | ICD-10-CM | POA: Diagnosis not present

## 2020-01-20 DIAGNOSIS — E8889 Other specified metabolic disorders: Secondary | ICD-10-CM | POA: Diagnosis not present

## 2020-01-20 DIAGNOSIS — I517 Cardiomegaly: Secondary | ICD-10-CM | POA: Diagnosis not present

## 2020-01-20 DIAGNOSIS — E872 Acidosis: Secondary | ICD-10-CM | POA: Diagnosis not present

## 2020-01-20 DIAGNOSIS — R000 Tachycardia, unspecified: Secondary | ICD-10-CM | POA: Diagnosis not present

## 2020-01-20 DIAGNOSIS — E860 Dehydration: Secondary | ICD-10-CM | POA: Diagnosis not present

## 2020-01-21 DIAGNOSIS — E8889 Other specified metabolic disorders: Secondary | ICD-10-CM | POA: Diagnosis not present

## 2020-01-21 DIAGNOSIS — E860 Dehydration: Secondary | ICD-10-CM | POA: Diagnosis not present

## 2020-01-21 DIAGNOSIS — E872 Acidosis: Secondary | ICD-10-CM | POA: Diagnosis not present

## 2020-01-21 DIAGNOSIS — F10139 Alcohol abuse with withdrawal, unspecified: Secondary | ICD-10-CM | POA: Diagnosis not present

## 2020-01-22 DIAGNOSIS — F10139 Alcohol abuse with withdrawal, unspecified: Secondary | ICD-10-CM | POA: Diagnosis not present

## 2020-01-22 DIAGNOSIS — E860 Dehydration: Secondary | ICD-10-CM | POA: Diagnosis not present

## 2020-01-22 DIAGNOSIS — E876 Hypokalemia: Secondary | ICD-10-CM | POA: Diagnosis not present

## 2020-01-22 DIAGNOSIS — I10 Essential (primary) hypertension: Secondary | ICD-10-CM | POA: Diagnosis not present

## 2020-01-22 DIAGNOSIS — E872 Acidosis: Secondary | ICD-10-CM | POA: Diagnosis not present

## 2020-01-23 DIAGNOSIS — E876 Hypokalemia: Secondary | ICD-10-CM | POA: Diagnosis not present

## 2020-01-23 DIAGNOSIS — E872 Acidosis: Secondary | ICD-10-CM | POA: Diagnosis not present

## 2020-01-23 DIAGNOSIS — I10 Essential (primary) hypertension: Secondary | ICD-10-CM | POA: Diagnosis not present

## 2020-01-23 DIAGNOSIS — E860 Dehydration: Secondary | ICD-10-CM | POA: Diagnosis not present

## 2020-01-23 DIAGNOSIS — F10139 Alcohol abuse with withdrawal, unspecified: Secondary | ICD-10-CM | POA: Diagnosis not present

## 2020-01-24 DIAGNOSIS — E872 Acidosis: Secondary | ICD-10-CM | POA: Diagnosis not present

## 2020-01-24 DIAGNOSIS — I10 Essential (primary) hypertension: Secondary | ICD-10-CM | POA: Diagnosis not present

## 2020-01-24 DIAGNOSIS — E876 Hypokalemia: Secondary | ICD-10-CM | POA: Diagnosis not present

## 2020-01-24 DIAGNOSIS — E860 Dehydration: Secondary | ICD-10-CM | POA: Diagnosis not present

## 2020-01-24 DIAGNOSIS — F10139 Alcohol abuse with withdrawal, unspecified: Secondary | ICD-10-CM | POA: Diagnosis not present

## 2020-02-13 DIAGNOSIS — N390 Urinary tract infection, site not specified: Secondary | ICD-10-CM | POA: Diagnosis not present

## 2020-02-13 DIAGNOSIS — R4781 Slurred speech: Secondary | ICD-10-CM | POA: Diagnosis not present

## 2020-02-13 DIAGNOSIS — R404 Transient alteration of awareness: Secondary | ICD-10-CM | POA: Diagnosis not present

## 2020-02-13 DIAGNOSIS — R4182 Altered mental status, unspecified: Secondary | ICD-10-CM | POA: Diagnosis not present

## 2020-02-25 DIAGNOSIS — G4700 Insomnia, unspecified: Secondary | ICD-10-CM | POA: Diagnosis not present

## 2020-02-25 DIAGNOSIS — K219 Gastro-esophageal reflux disease without esophagitis: Secondary | ICD-10-CM | POA: Diagnosis not present

## 2020-02-25 DIAGNOSIS — G8929 Other chronic pain: Secondary | ICD-10-CM | POA: Diagnosis not present

## 2020-02-25 DIAGNOSIS — I10 Essential (primary) hypertension: Secondary | ICD-10-CM | POA: Diagnosis not present

## 2020-02-25 DIAGNOSIS — E782 Mixed hyperlipidemia: Secondary | ICD-10-CM | POA: Diagnosis not present

## 2020-02-25 DIAGNOSIS — N400 Enlarged prostate without lower urinary tract symptoms: Secondary | ICD-10-CM | POA: Diagnosis not present

## 2020-03-16 DIAGNOSIS — R7303 Prediabetes: Secondary | ICD-10-CM | POA: Diagnosis not present

## 2020-03-16 DIAGNOSIS — D649 Anemia, unspecified: Secondary | ICD-10-CM | POA: Diagnosis not present

## 2020-03-16 DIAGNOSIS — E782 Mixed hyperlipidemia: Secondary | ICD-10-CM | POA: Diagnosis not present

## 2020-03-19 DIAGNOSIS — M7582 Other shoulder lesions, left shoulder: Secondary | ICD-10-CM | POA: Diagnosis not present

## 2020-03-19 DIAGNOSIS — M25512 Pain in left shoulder: Secondary | ICD-10-CM | POA: Diagnosis not present

## 2020-04-03 ENCOUNTER — Other Ambulatory Visit: Payer: Self-pay

## 2020-04-03 ENCOUNTER — Inpatient Hospital Stay (HOSPITAL_COMMUNITY)
Admission: EM | Admit: 2020-04-03 | Discharge: 2020-04-08 | DRG: 897 | Disposition: A | Discharge status: Home / Self Care | Payer: Medicare Other | Attending: Family Medicine | Admitting: Family Medicine

## 2020-04-03 ENCOUNTER — Emergency Department (HOSPITAL_COMMUNITY): Payer: Medicare Other

## 2020-04-03 ENCOUNTER — Encounter (HOSPITAL_COMMUNITY): Payer: Self-pay | Admitting: Emergency Medicine

## 2020-04-03 DIAGNOSIS — Z20822 Contact with and (suspected) exposure to covid-19: Secondary | ICD-10-CM | POA: Diagnosis present

## 2020-04-03 DIAGNOSIS — F10231 Alcohol dependence with withdrawal delirium: Secondary | ICD-10-CM | POA: Diagnosis present

## 2020-04-03 DIAGNOSIS — R45851 Suicidal ideations: Secondary | ICD-10-CM | POA: Diagnosis not present

## 2020-04-03 DIAGNOSIS — F10229 Alcohol dependence with intoxication, unspecified: Secondary | ICD-10-CM | POA: Diagnosis present

## 2020-04-03 DIAGNOSIS — E162 Hypoglycemia, unspecified: Secondary | ICD-10-CM | POA: Diagnosis not present

## 2020-04-03 DIAGNOSIS — Z86718 Personal history of other venous thrombosis and embolism: Secondary | ICD-10-CM

## 2020-04-03 DIAGNOSIS — F29 Unspecified psychosis not due to a substance or known physiological condition: Secondary | ICD-10-CM | POA: Diagnosis not present

## 2020-04-03 DIAGNOSIS — F1020 Alcohol dependence, uncomplicated: Secondary | ICD-10-CM | POA: Diagnosis not present

## 2020-04-03 DIAGNOSIS — F10230 Alcohol dependence with withdrawal, uncomplicated: Secondary | ICD-10-CM | POA: Diagnosis not present

## 2020-04-03 DIAGNOSIS — E7800 Pure hypercholesterolemia, unspecified: Secondary | ICD-10-CM | POA: Diagnosis not present

## 2020-04-03 DIAGNOSIS — Z8249 Family history of ischemic heart disease and other diseases of the circulatory system: Secondary | ICD-10-CM

## 2020-04-03 DIAGNOSIS — G8929 Other chronic pain: Secondary | ICD-10-CM | POA: Diagnosis not present

## 2020-04-03 DIAGNOSIS — I10 Essential (primary) hypertension: Secondary | ICD-10-CM | POA: Diagnosis present

## 2020-04-03 DIAGNOSIS — N400 Enlarged prostate without lower urinary tract symptoms: Secondary | ICD-10-CM | POA: Diagnosis not present

## 2020-04-03 DIAGNOSIS — E876 Hypokalemia: Secondary | ICD-10-CM | POA: Diagnosis present

## 2020-04-03 DIAGNOSIS — E785 Hyperlipidemia, unspecified: Secondary | ICD-10-CM | POA: Diagnosis present

## 2020-04-03 DIAGNOSIS — E872 Acidosis, unspecified: Secondary | ICD-10-CM | POA: Diagnosis present

## 2020-04-03 DIAGNOSIS — Z823 Family history of stroke: Secondary | ICD-10-CM

## 2020-04-03 DIAGNOSIS — G4700 Insomnia, unspecified: Secondary | ICD-10-CM | POA: Diagnosis not present

## 2020-04-03 DIAGNOSIS — D72829 Elevated white blood cell count, unspecified: Secondary | ICD-10-CM | POA: Diagnosis present

## 2020-04-03 DIAGNOSIS — F10239 Alcohol dependence with withdrawal, unspecified: Secondary | ICD-10-CM | POA: Diagnosis present

## 2020-04-03 DIAGNOSIS — Z8711 Personal history of peptic ulcer disease: Secondary | ICD-10-CM | POA: Diagnosis not present

## 2020-04-03 DIAGNOSIS — F1010 Alcohol abuse, uncomplicated: Secondary | ICD-10-CM

## 2020-04-03 DIAGNOSIS — R000 Tachycardia, unspecified: Secondary | ICD-10-CM | POA: Diagnosis not present

## 2020-04-03 DIAGNOSIS — R21 Rash and other nonspecific skin eruption: Secondary | ICD-10-CM | POA: Diagnosis not present

## 2020-04-03 DIAGNOSIS — J9811 Atelectasis: Secondary | ICD-10-CM | POA: Diagnosis not present

## 2020-04-03 DIAGNOSIS — F10939 Alcohol use, unspecified with withdrawal, unspecified: Secondary | ICD-10-CM | POA: Diagnosis present

## 2020-04-03 DIAGNOSIS — E782 Mixed hyperlipidemia: Secondary | ICD-10-CM | POA: Diagnosis not present

## 2020-04-03 DIAGNOSIS — F32A Depression, unspecified: Secondary | ICD-10-CM | POA: Diagnosis present

## 2020-04-03 DIAGNOSIS — E161 Other hypoglycemia: Secondary | ICD-10-CM | POA: Diagnosis not present

## 2020-04-03 DIAGNOSIS — F10929 Alcohol use, unspecified with intoxication, unspecified: Secondary | ICD-10-CM | POA: Diagnosis present

## 2020-04-03 DIAGNOSIS — K219 Gastro-esophageal reflux disease without esophagitis: Secondary | ICD-10-CM | POA: Diagnosis not present

## 2020-04-03 LAB — COMPREHENSIVE METABOLIC PANEL WITH GFR
ALT: 36 U/L (ref 0–44)
AST: 39 U/L (ref 15–41)
Albumin: 4.1 g/dL (ref 3.5–5.0)
Alkaline Phosphatase: 76 U/L (ref 38–126)
Anion gap: 25 — ABNORMAL HIGH (ref 5–15)
BUN: 28 mg/dL — ABNORMAL HIGH (ref 8–23)
CO2: 12 mmol/L — ABNORMAL LOW (ref 22–32)
Calcium: 8.4 mg/dL — ABNORMAL LOW (ref 8.9–10.3)
Chloride: 102 mmol/L (ref 98–111)
Creatinine, Ser: 1.29 mg/dL — ABNORMAL HIGH (ref 0.61–1.24)
GFR, Estimated: >60 mL/min
Glucose, Bld: 125 mg/dL — ABNORMAL HIGH (ref 70–99)
Potassium: 4.6 mmol/L (ref 3.5–5.1)
Sodium: 139 mmol/L (ref 135–145)
Total Bilirubin: 1.2 mg/dL (ref 0.3–1.2)
Total Protein: 7.6 g/dL (ref 6.5–8.1)

## 2020-04-03 LAB — URINALYSIS, ROUTINE W REFLEX MICROSCOPIC
Bacteria, UA: NONE SEEN
Bilirubin Urine: NEGATIVE
Glucose, UA: NEGATIVE mg/dL
Ketones, ur: 20 mg/dL — AB
Leukocytes,Ua: NEGATIVE
Nitrite: NEGATIVE
Protein, ur: NEGATIVE mg/dL
Specific Gravity, Urine: 1.019 (ref 1.005–1.030)
pH: 5 (ref 5.0–8.0)

## 2020-04-03 LAB — CBC WITH DIFFERENTIAL/PLATELET
Abs Immature Granulocytes: 0.08 K/uL — ABNORMAL HIGH (ref 0.00–0.07)
Basophils Absolute: 0.1 K/uL (ref 0.0–0.1)
Basophils Relative: 1 %
Eosinophils Absolute: 1.8 K/uL — ABNORMAL HIGH (ref 0.0–0.5)
Eosinophils Relative: 10 %
HCT: 48 % (ref 39.0–52.0)
Hemoglobin: 15.6 g/dL (ref 13.0–17.0)
Immature Granulocytes: 0 %
Lymphocytes Relative: 6 %
Lymphs Abs: 1.1 K/uL (ref 0.7–4.0)
MCH: 30.1 pg (ref 26.0–34.0)
MCHC: 32.5 g/dL (ref 30.0–36.0)
MCV: 92.5 fL (ref 80.0–100.0)
Monocytes Absolute: 0.8 K/uL (ref 0.1–1.0)
Monocytes Relative: 5 %
Neutro Abs: 14.4 K/uL — ABNORMAL HIGH (ref 1.7–7.7)
Neutrophils Relative %: 78 %
Platelets: 316 K/uL (ref 150–400)
RBC: 5.19 MIL/uL (ref 4.22–5.81)
RDW: 12.8 % (ref 11.5–15.5)
WBC: 18.3 K/uL — ABNORMAL HIGH (ref 4.0–10.5)
nRBC: 0 % (ref 0.0–0.2)

## 2020-04-03 LAB — RAPID URINE DRUG SCREEN, HOSP PERFORMED
Amphetamines: NOT DETECTED
Barbiturates: NOT DETECTED
Benzodiazepines: NOT DETECTED
Cocaine: NOT DETECTED
Opiates: NOT DETECTED
Tetrahydrocannabinol: NOT DETECTED

## 2020-04-03 LAB — LACTIC ACID, PLASMA
Lactic Acid, Venous: 9.2 mmol/L (ref 0.5–1.9)
Lactic Acid, Venous: 5 mmol/L (ref 0.5–1.9)

## 2020-04-03 LAB — ETHANOL: Alcohol, Ethyl (B): 168 mg/dL — ABNORMAL HIGH

## 2020-04-03 LAB — SALICYLATE LEVEL: Salicylate Lvl: <7 mg/dL — ABNORMAL LOW (ref 7.0–30.0)

## 2020-04-03 LAB — CBG MONITORING, ED: Glucose-Capillary: 114 mg/dL — ABNORMAL HIGH (ref 70–99)

## 2020-04-03 LAB — ACETAMINOPHEN LEVEL: Acetaminophen (Tylenol), Serum: <10 ug/mL — ABNORMAL LOW (ref 10–30)

## 2020-04-03 LAB — LIPASE, BLOOD: Lipase: 18 U/L (ref 11–51)

## 2020-04-03 LAB — PROTIME-INR
INR: 1.1 (ref 0.8–1.2)
Prothrombin Time: 13.3 seconds (ref 11.4–15.2)

## 2020-04-03 LAB — MAGNESIUM: Magnesium: 1.7 mg/dL (ref 1.7–2.4)

## 2020-04-03 LAB — RESP PANEL BY RT-PCR (FLU A&B, COVID) ARPGX2
Influenza A by PCR: NEGATIVE
Influenza B by PCR: NEGATIVE
SARS Coronavirus 2 by RT PCR: NEGATIVE

## 2020-04-03 LAB — APTT: aPTT: 26 seconds (ref 24–36)

## 2020-04-03 LAB — TROPONIN I (HIGH SENSITIVITY)
Troponin I (High Sensitivity): 12 ng/L (ref <18)
Troponin I (High Sensitivity): 19 ng/L — ABNORMAL HIGH (ref <18)

## 2020-04-03 LAB — MRSA PCR SCREENING: MRSA by PCR: NEGATIVE

## 2020-04-03 LAB — D-DIMER, QUANTITATIVE: D-Dimer, Quant: 0.57 ug/mL-FEU — ABNORMAL HIGH (ref 0.00–0.50)

## 2020-04-03 MED ORDER — SENNOSIDES-DOCUSATE SODIUM 8.6-50 MG PO TABS: 1.0000 | ORAL_TABLET | Freq: Every evening | ORAL | Status: DC | PRN

## 2020-04-03 MED ORDER — VANCOMYCIN HCL 1750 MG/350ML IV SOLN
1750.0000 mg | Freq: Once | INTRAVENOUS | Status: AC
  Administered 2020-04-03: 1750 mg via INTRAVENOUS
  Filled 2020-04-03: qty 350

## 2020-04-03 MED ORDER — CHLORDIAZEPOXIDE HCL 25 MG PO CAPS
25.0000 mg | ORAL_CAPSULE | Freq: Every day | ORAL | Status: AC
  Administered 2020-04-06: 25 mg via ORAL
  Filled 2020-04-03: qty 1

## 2020-04-03 MED ORDER — AMLODIPINE BESYLATE 5 MG PO TABS
5.0000 mg | ORAL_TABLET | Freq: Every day | ORAL | Status: DC
  Administered 2020-04-04 – 2020-04-08 (×5): 5 mg via ORAL
  Filled 2020-04-03 (×5): qty 1

## 2020-04-03 MED ORDER — FOLIC ACID 1 MG PO TABS
1.0000 mg | ORAL_TABLET | Freq: Every day | ORAL | Status: DC
  Administered 2020-04-03 – 2020-04-08 (×6): 1 mg via ORAL
  Filled 2020-04-03 (×6): qty 1

## 2020-04-03 MED ORDER — LORAZEPAM 1 MG PO TABS
2.0000 mg | ORAL_TABLET | Freq: Once | ORAL | Status: AC
  Administered 2020-04-03: 2 mg via ORAL
  Filled 2020-04-03: qty 2

## 2020-04-03 MED ORDER — ENOXAPARIN SODIUM 40 MG/0.4ML ~~LOC~~ SOLN
40.0000 mg | SUBCUTANEOUS | Status: DC
  Administered 2020-04-03 – 2020-04-07 (×5): 40 mg via SUBCUTANEOUS
  Filled 2020-04-03 (×5): qty 0.4

## 2020-04-03 MED ORDER — ONDANSETRON HCL 4 MG PO TABS: 4.0000 mg | ORAL_TABLET | Freq: Four times a day (QID) | ORAL | Status: DC | PRN

## 2020-04-03 MED ORDER — ONDANSETRON HCL 4 MG/2ML IJ SOLN: 4.0000 mg | Freq: Four times a day (QID) | INTRAMUSCULAR | Status: DC | PRN

## 2020-04-03 MED ORDER — CHLORDIAZEPOXIDE HCL 25 MG PO CAPS
25.0000 mg | ORAL_CAPSULE | Freq: Three times a day (TID) | ORAL | Status: AC
  Administered 2020-04-04 – 2020-04-05 (×3): 25 mg via ORAL
  Filled 2020-04-03 (×3): qty 1

## 2020-04-03 MED ORDER — LORAZEPAM 1 MG PO TABS: 0.0000 mg | ORAL_TABLET | Freq: Two times a day (BID) | ORAL | Status: DC

## 2020-04-03 MED ORDER — LACTATED RINGERS IV BOLUS (SEPSIS)
1550.0000 mL | Freq: Once | INTRAVENOUS | Status: AC
  Administered 2020-04-03: 1000 mL via INTRAVENOUS

## 2020-04-03 MED ORDER — BISACODYL 5 MG PO TBEC: 5.0000 mg | DELAYED_RELEASE_TABLET | Freq: Every day | ORAL | Status: DC | PRN

## 2020-04-03 MED ORDER — LOPERAMIDE HCL 2 MG PO CAPS: 2.0000 mg | ORAL_CAPSULE | ORAL | Status: AC | PRN

## 2020-04-03 MED ORDER — ACETAMINOPHEN 325 MG PO TABS: 650.0000 mg | ORAL_TABLET | Freq: Four times a day (QID) | ORAL | Status: DC | PRN

## 2020-04-03 MED ORDER — LACTATED RINGERS IV SOLN: INTRAVENOUS | Status: DC

## 2020-04-03 MED ORDER — THIAMINE HCL 100 MG PO TABS
100.0000 mg | ORAL_TABLET | Freq: Every day | ORAL | Status: DC
Start: 2020-04-03 — End: 2020-04-03
  Administered 2020-04-03: 100 mg via ORAL
  Filled 2020-04-03: qty 1

## 2020-04-03 MED ORDER — HYDROXYZINE HCL 25 MG PO TABS: 25.0000 mg | ORAL_TABLET | Freq: Four times a day (QID) | ORAL | Status: AC | PRN

## 2020-04-03 MED ORDER — THIAMINE HCL 100 MG PO TABS
100.0000 mg | ORAL_TABLET | Freq: Every day | ORAL | Status: DC
  Administered 2020-04-03 – 2020-04-08 (×5): 100 mg via ORAL
  Filled 2020-04-03 (×6): qty 1

## 2020-04-03 MED ORDER — METOPROLOL TARTRATE 25 MG PO TABS
25.0000 mg | ORAL_TABLET | Freq: Two times a day (BID) | ORAL | Status: DC
  Administered 2020-04-03 – 2020-04-08 (×9): 25 mg via ORAL
  Filled 2020-04-03 (×9): qty 1

## 2020-04-03 MED ORDER — CHLORHEXIDINE GLUCONATE CLOTH 2 % EX PADS
6.0000 | MEDICATED_PAD | Freq: Every day | CUTANEOUS | Status: DC
  Administered 2020-04-04 – 2020-04-08 (×4): 6 via TOPICAL

## 2020-04-03 MED ORDER — ACETAMINOPHEN 650 MG RE SUPP: 650.0000 mg | Freq: Four times a day (QID) | RECTAL | Status: DC | PRN

## 2020-04-03 MED ORDER — SODIUM CHLORIDE 0.9% FLUSH
3.0000 mL | Freq: Two times a day (BID) | INTRAVENOUS | Status: DC
  Administered 2020-04-03 – 2020-04-08 (×10): 3 mL via INTRAVENOUS

## 2020-04-03 MED ORDER — THIAMINE HCL 100 MG/ML IJ SOLN
100.0000 mg | Freq: Every day | INTRAMUSCULAR | Status: DC
  Administered 2020-04-04: 100 mg via INTRAVENOUS
  Filled 2020-04-03 (×2): qty 2

## 2020-04-03 MED ORDER — CHLORDIAZEPOXIDE HCL 25 MG PO CAPS
25.0000 mg | ORAL_CAPSULE | Freq: Four times a day (QID) | ORAL | Status: AC
  Administered 2020-04-03 – 2020-04-04 (×3): 25 mg via ORAL
  Filled 2020-04-03 (×3): qty 1

## 2020-04-03 MED ORDER — LACTATED RINGERS IV BOLUS (SEPSIS)
1000.0000 mL | Freq: Once | INTRAVENOUS | Status: AC
  Administered 2020-04-03: 1000 mL via INTRAVENOUS

## 2020-04-03 MED ORDER — VANCOMYCIN HCL IN DEXTROSE 1-5 GM/200ML-% IV SOLN: 1000.0000 mg | Freq: Once | INTRAVENOUS | Status: DC

## 2020-04-03 MED ORDER — LORAZEPAM 2 MG/ML IJ SOLN
0.0000 mg | Freq: Four times a day (QID) | INTRAMUSCULAR | Status: DC
  Administered 2020-04-03: 2 mg via INTRAVENOUS
  Filled 2020-04-03: qty 1

## 2020-04-03 MED ORDER — ATORVASTATIN CALCIUM 20 MG PO TABS
20.0000 mg | ORAL_TABLET | Freq: Every day | ORAL | Status: DC
  Administered 2020-04-04 – 2020-04-08 (×5): 20 mg via ORAL
  Filled 2020-04-03: qty 2
  Filled 2020-04-03: qty 1
  Filled 2020-04-03 (×3): qty 2

## 2020-04-03 MED ORDER — LACTATED RINGERS IV BOLUS
1000.0000 mL | Freq: Once | INTRAVENOUS | Status: AC
  Administered 2020-04-03: 1000 mL via INTRAVENOUS

## 2020-04-03 MED ORDER — LORAZEPAM 1 MG PO TABS: 0.0000 mg | ORAL_TABLET | Freq: Four times a day (QID) | ORAL | Status: DC

## 2020-04-03 MED ORDER — LORAZEPAM 1 MG PO TABS
1.0000 mg | ORAL_TABLET | ORAL | Status: AC | PRN
  Administered 2020-04-03 – 2020-04-04 (×3): 1 mg via ORAL
  Administered 2020-04-05: 2 mg via ORAL
  Administered 2020-04-05 (×2): 1 mg via ORAL
  Administered 2020-04-05: 2 mg via ORAL
  Filled 2020-04-03: qty 2
  Filled 2020-04-03: qty 1
  Filled 2020-04-03: qty 2
  Filled 2020-04-03 (×3): qty 1
  Filled 2020-04-03: qty 4

## 2020-04-03 MED ORDER — CHLORDIAZEPOXIDE HCL 25 MG PO CAPS
25.0000 mg | ORAL_CAPSULE | ORAL | Status: AC
  Administered 2020-04-05 – 2020-04-06 (×2): 25 mg via ORAL
  Filled 2020-04-03 (×2): qty 1

## 2020-04-03 MED ORDER — THIAMINE HCL 100 MG/ML IJ SOLN: 100.0000 mg | Freq: Every day | INTRAMUSCULAR | Status: DC

## 2020-04-03 MED ORDER — LORAZEPAM 2 MG/ML IJ SOLN
1.0000 mg | INTRAMUSCULAR | Status: AC | PRN
Start: 2020-04-03 — End: 2020-04-06

## 2020-04-03 MED ORDER — LACTATED RINGERS IV SOLN: INTRAVENOUS | Status: AC

## 2020-04-03 MED ORDER — SODIUM CHLORIDE 0.9 % IV SOLN
2.0000 g | Freq: Once | INTRAVENOUS | Status: AC
  Administered 2020-04-03: 2 g via INTRAVENOUS
  Filled 2020-04-03: qty 2

## 2020-04-03 MED ORDER — LORAZEPAM 2 MG/ML IJ SOLN: 0.0000 mg | Freq: Two times a day (BID) | INTRAMUSCULAR | Status: DC

## 2020-04-03 MED ORDER — ADULT MULTIVITAMIN W/MINERALS CH
1.0000 | ORAL_TABLET | Freq: Every day | ORAL | Status: DC
  Administered 2020-04-03 – 2020-04-08 (×6): 1 via ORAL
  Filled 2020-04-03 (×6): qty 1

## 2020-04-03 NOTE — ED Notes (Signed)
Pt given urinal and asked for urine sample

## 2020-04-03 NOTE — Progress Notes (Signed)
Notified bedside nurse of need to draw repeat lactic acid. 

## 2020-04-03 NOTE — Progress Notes (Signed)
elink tracking sepsis

## 2020-04-03 NOTE — ED Notes (Signed)
Tech attempted to collect second set of blood cultures with no success

## 2020-04-03 NOTE — ED Provider Notes (Signed)
[facility] DEPT Provider Note   CSN: [id number] Arrival date & time: 04/03/20  1500     History Chief Complaint  Patient presents with  . Medical Clearance    Ruben [name] is a 69 y.o. male with a past medical history of alcoholism, MR, HTN, AKI, who presents today for evaluation of SI and alochol abuse.    He reports that over the pat 5 days he has been binge drinking and had a fifth of vodka each day.  He states that he is suicidial with plans to shoot him self with a gun.  He reports pressure from his family and negative interactions as a cause of this.  He states that he went and got a gun to shoot him self, however his neighbor called [employer].  [employer] arrived and took his gun from him and gave it to his neighbor.  He states that he would have killed him self with it if he didn't take it away.  He denies HI or AVH.    He was given glucagon by EMS for hypoglycemia.  He reports poor appetite over the past 5 days.   Last drink was around 11 am.   He has been dry heaving.  He thinks he had zofran with EMS however RN reports this was not mentioned.    He reports significant tremor.  HPI     Past Medical History:  Diagnosis Date  . Alcoholism ([place])   . Aortic atherosclerosis ([place]) 12/26/2016   Noted on CT  . Arthritis   . BPH (benign prostatic hyperplasia)   . Chronic constipation    takes magnesium  . Duodenal ulcer   . Dyspnea 12/17/2017  . ED (erectile dysfunction)   . Foley catheter in place    history of no longer in place  . GERD (gastroesophageal reflux disease)   . Heart palpitations    Occ  . Hiatal hernia   . History of adenomatous polyp of colon    tubular adenoma's 2005  . History of Helicobacter pylori infection    2008  . Hypercholesteremia   . Hypertension   . MR (mitral regurgitation)    Mild, noted on ECHO  . Pre-diabetes   . Pulmonary nodule 12/26/2016   noted on CT, 5 x 3 mm nodular opacity right upper lobe  .  Rosacea   . Schatzki's ring    last dilated 02/ 2016  . Urinary retention    history of  . Varicocele 02/25/2014   Small to moderate left varicocele    Patient Active Problem List   Diagnosis Date Noted  . Alcohol use disorder, severe, dependence ([place]) 04/03/2020  . Hyperlipidemia 04/03/2020  . Suicidal ideation 04/03/2020  . Alcoholic ketoacidosis [id number]  . Thrombocytopenia ([place]) 12/22/2019  . ARF (acute renal failure) ([place]) 10/01/2019  . Alcohol withdrawal ([place]) 10/01/2019  . Normochromic normocytic anemia 10/01/2019  . GERD (gastroesophageal reflux disease) 06/22/2019  . BPH (benign prostatic hyperplasia) 06/22/2019  . Essential hypertension 06/22/2019  . AKI (acute kidney injury) ([place])   . Alcohol intoxication ([place]) 06/21/2019  . S/P Nissen fundoplication (without gastrostomy tube) procedure 04/26/2018    Past Surgical History:  Procedure Laterality Date  . BALLOON DILATION N/A 06/08/2014   Procedure: BALLOON DILATION;  Surgeon: Garlan Fair, MD;  Location: [facility];  Service: Endoscopy;  Laterality: N/A;  . COLONOSCOPY WITH PROPOFOL N/A 06/08/2014   Procedure: COLONOSCOPY WITH PROPOFOL;  Surgeon: Garlan Fair, MD;  Location: [facility];  Service: Endoscopy;  Laterality: N/A;  . ESOPHAGOGASTRODUODENOSCOPY N/A 06/08/2014   Procedure: ESOPHAGOGASTRODUODENOSCOPY (EGD);  Surgeon: Garlan Fair, MD;  Location: [facility];  Service: Endoscopy;  Laterality: N/A;  . ESOPHAGOGASTRODUODENOSCOPY (EGD) WITH ESOPHAGEAL DILATION N/A 12/31/2012   Procedure: ESOPHAGOGASTRODUODENOSCOPY (EGD) WITH ESOPHAGEAL DILATION;  Surgeon: Garlan Fair, MD;  Location: [facility];  Service: Endoscopy;  Laterality: N/A;  . GREEN LIGHT LASER TURP (TRANSURETHRAL RESECTION OF PROSTATE N/A 12/28/2015   Procedure: GREEN LIGHT LASER TURP (TRANSURETHRAL RESECTION OF PROSTATE;  Surgeon: Festus Aloe, MD;  Location: [facility];  Service: Urology;  Laterality: N/A;        Family History  Problem Relation Age of Onset  . Heart failure Mother   . Stroke Father     Social History   Tobacco Use  . Smoking status: Never Smoker  . Smokeless tobacco: Never Used  Vaping Use  . Vaping Use: Never used  Substance Use Topics  . Alcohol use: Yes  . Drug use: No    Home Medications Prior to Admission medications   Medication Sig Start Date End Date Taking? Authorizing Provider  amLODipine (NORVASC) 5 MG tablet Take 1 tablet (5 mg total) by mouth daily. 12/30/19 12/29/20 Yes Mercy Riding, MD  atorvastatin (LIPITOR) 20 MG tablet Take 1 tablet (20 mg total) by mouth daily. 10/06/19 12/22/19 Yes Alma Friendly, MD  feeding supplement, ENSURE ENLIVE, (ENSURE ENLIVE) LIQD Take 237 mLs by mouth 2 (two) times daily between meals. 12/30/19  Yes Mercy Riding, MD  folic acid (FOLVITE) 1 MG tablet Take 1 mg by mouth daily.   Yes [provider]  Magnesium 400 MG TABS Take 1,600 mg by mouth daily.   Yes [provider]  magnesium citrate SOLN Take 296 mLs (1 Bottle total) by mouth daily as needed for severe constipation. 12/30/19  Yes Mercy Riding, MD  metoprolol tartrate (LOPRESSOR) 25 MG tablet Take 1 tablet (25 mg total) by mouth 2 (two) times daily. 10/06/19 12/22/19 Yes Alma Friendly, MD  Multiple Vitamin (MULTIVITAMIN WITH MINERALS) TABS tablet Take 1 tablet by mouth daily. 12/30/19  Yes Mercy Riding, MD  polyethylene glycol (MIRALAX / GLYCOLAX) 17 g packet Take 17 g by mouth 2 (two) times daily as needed for mild constipation. 12/30/19  Yes Mercy Riding, MD  senna-docusate (SENOKOT-S) 8.6-50 MG tablet Take 1 tablet by mouth 2 (two) times daily as needed for moderate constipation. 12/30/19  Yes Mercy Riding, MD  buPROPion (WELLBUTRIN XL) 150 MG 24 hr tablet Take 150 mg by mouth daily. 02/19/20   [provider]  busPIRone (BUSPAR) 5 MG tablet Take 1 tablet (5 mg total) by mouth 3 (three) times daily. Patient not taking: No sig  reported 12/30/19   Mercy Riding, MD  clonazePAM (KLONOPIN) 0.5 MG tablet Take 1 tablet (0.5 mg total) by mouth 2 (two) times daily. Patient not taking: No sig reported 12/30/19   Mercy Riding, MD  doxepin (SINEQUAN) 10 MG capsule Take 10 mg by mouth at bedtime. Patient not taking: Reported on 04/03/2020 03/17/20   [provider]  escitalopram (LEXAPRO) 20 MG tablet Take 20 mg by mouth daily. Patient not taking: Reported on 04/03/2020 02/19/20   [provider]    Allergies    Patient has no known allergies.  Review of Systems   Review of Systems  Constitutional: Positive for chills and fatigue. Negative for fever.  Eyes: Negative for visual disturbance.  Respiratory: Negative for cough and shortness of breath.   Gastrointestinal: Positive for nausea. Negative for abdominal pain and vomiting (Dry heaving).  Genitourinary: Negative for dysuria.  Musculoskeletal: Negative for back pain.  Skin: Positive for rash (Over chest and upper back for the past "few years").  Neurological: Positive for weakness. Negative for headaches.  Psychiatric/Behavioral: Positive for behavioral problems, dysphoric mood and suicidal ideas. Negative for hallucinations. The patient is not nervous/anxious.   All other systems reviewed and are negative.   Physical Exam Updated Vital Signs BP (!) 154/89   Pulse (!) 121   Temp 99.5 F (37.5 C) (Rectal)   Resp (!) 21   Ht 5\' 11"  (1.803 m)   Wt 84.8 kg   SpO2 92%   BMI 26.08 kg/m   Physical Exam Vitals and nursing note reviewed.  Constitutional:      Appearance: He is ill-appearing. He is not diaphoretic.  HENT:     Head: Normocephalic and atraumatic.  Eyes:     General: No scleral icterus.       Right eye: No discharge.        Left eye: No discharge.     Conjunctiva/sclera: Conjunctivae normal.  Cardiovascular:     Rate and Rhythm: Regular rhythm. Tachycardia present.  Pulmonary:     Effort: Pulmonary effort is normal. No  respiratory distress.     Breath sounds: No stridor.  Abdominal:     General: There is no distension.     Tenderness: There is no abdominal tenderness. There is no guarding.  Musculoskeletal:        General: No deformity.     Cervical back: Normal range of motion and neck supple.     Right lower leg: No edema.     Left lower leg: No edema.  Skin:    General: Skin is warm and dry.  Neurological:     Mental Status: He is alert.     Motor: No abnormal muscle tone.     Comments: Significant tremor to the point that he can't unlock his cell phone.   Psychiatric:     Comments: Suicidal with a plan to shoot him self, got a gun today.       ED Results / Procedures / Treatments   Labs (all labs ordered are listed, but only abnormal results are displayed) Labs Reviewed  COMPREHENSIVE METABOLIC PANEL - Abnormal; Notable for the following components:      Result Value   CO2 12 (*)    Glucose, Bld 125 (*)    BUN 28 (*)    Creatinine, Ser 1.29 (*)    Calcium 8.4 (*)    Anion gap 25 (*)    All other components within normal limits  ETHANOL - Abnormal; Notable for the following components:   Alcohol, Ethyl (B) 168 (*)    All other components within normal limits  CBC WITH DIFFERENTIAL/PLATELET - Abnormal; Notable for the following components:   WBC 18.3 (*)    Neutro Abs 14.4 (*)    Eosinophils Absolute 1.8 (*)    Abs Immature Granulocytes 0.08 (*)    All other components within normal limits  SALICYLATE LEVEL - Abnormal; Notable for the following components:   Salicylate Lvl Q000111Q (*)    All other components within normal limits  ACETAMINOPHEN LEVEL - Abnormal; Notable for the following components:   Acetaminophen (Tylenol), Serum <10 (*)    All other components within normal limits  URINALYSIS, ROUTINE W REFLEX MICROSCOPIC -  Abnormal; Notable for the following components:   Hgb urine dipstick SMALL (*)    Ketones, ur 20 (*)    All other components within normal limits  LACTIC  ACID, PLASMA - Abnormal; Notable for the following components:   Lactic Acid, Venous 9.2 (*)    All other components within normal limits  LACTIC ACID, PLASMA - Abnormal; Notable for the following components:   Lactic Acid, Venous 5.0 (*)    All other components within normal limits  D-DIMER, QUANTITATIVE (NOT AT [facility]) - Abnormal; Notable for the following components:   D-Dimer, Quant 0.57 (*)    All other components within normal limits  CBG MONITORING, ED - Abnormal; Notable for the following components:   Glucose-Capillary 114 (*)    All other components within normal limits  TROPONIN I (HIGH SENSITIVITY) - Abnormal; Notable for the following components:   Troponin I (High Sensitivity) 19 (*)    All other components within normal limits  RESP PANEL BY RT-PCR (FLU A&B, COVID) ARPGX2  MRSA PCR SCREENING  CULTURE, BLOOD (ROUTINE X 2)  CULTURE, BLOOD (ROUTINE X 2)  URINE CULTURE  RAPID URINE DRUG SCREEN, HOSP PERFORMED  PROTIME-INR  APTT  LIPASE, BLOOD  MAGNESIUM  MAGNESIUM  CBC  COMPREHENSIVE METABOLIC PANEL  LACTIC ACID, PLASMA  TROPONIN I (HIGH SENSITIVITY)    EKG EKG Interpretation  Date/Time:  Saturday April 03 2020 15:47:55 EST Ventricular Rate:  142 PR Interval:    QRS Duration: 82 QT Interval:  293 QTC Calculation: 451 R Axis:   -150 Text Interpretation: Sinus tachycardia Probable left atrial enlargement Markedly posterior QRS axis Consider anterior infarct No significant change since last tracing Confirmed by Gareth Morgan [phone]) on 04/03/2020 4:25:21 PM   Radiology DG Chest Portable 1 View  Result Date: 04/03/2020 CLINICAL DATA:  69 year old male with alcohol withdrawal. EXAM: PORTABLE CHEST 1 VIEW COMPARISON:  Chest radiograph dated 10/01/2019. FINDINGS: Minimal left lung base atelectasis. No focal consolidation, pleural effusion, pneumothorax. Top-normal cardiac size. No acute osseous pathology. IMPRESSION: No active disease. Electronically Signed    By: Anner Crete M.D.   On: 04/03/2020 16:00    Procedures .Critical Care Performed by: Lorin Glass, PA-C Authorized by: Lorin Glass, PA-C   Critical care provider statement:    Critical care time (minutes):  45   Critical care time was exclusive of:  Separately billable procedures and treating other patients and teaching time   Critical care was time spent personally by me on the following activities:  Discussions with consultants, evaluation of patient's response to treatment, examination of patient, ordering and performing treatments and interventions, ordering and review of laboratory studies, ordering and review of radiographic studies, pulse oximetry, re-evaluation of patient's condition, obtaining history from patient or surrogate and review of old charts   (including critical care time)  Medications Ordered in ED Medications  enoxaparin (LOVENOX) injection 40 mg (40 mg Subcutaneous Given 04/03/20 2153)  sodium chloride flush (NS) 0.9 % injection 3 mL (3 mLs Intravenous Given 04/03/20 2158)  acetaminophen (TYLENOL) tablet 650 mg (has no administration in time range)    Or  acetaminophen (TYLENOL) suppository 650 mg (has no administration in time range)  ondansetron (ZOFRAN) tablet 4 mg (has no administration in time range)    Or  ondansetron (ZOFRAN) injection 4 mg (has no administration in time range)  senna-docusate (Senokot-S) tablet 1 tablet (has no administration in time range)  bisacodyl (DULCOLAX) EC tablet 5 mg (has no administration in time range)  LORazepam (ATIVAN) tablet 1-4 mg (1 mg Oral Given 04/03/20 2150)    Or  LORazepam (ATIVAN) injection 1-4 mg ( Intravenous See Alternative 04/03/20 2150)  thiamine tablet 100 mg (100 mg Oral Given 04/03/20 2151)    Or  thiamine (B-1) injection 100 mg ( Intravenous See Alternative [id number])  folic acid (FOLVITE) tablet 1 mg (1 mg Oral Given 04/03/20 2151)  multivitamin with minerals tablet 1 tablet  (1 tablet Oral Given 04/03/20 2151)  lactated ringers bolus 1,000 mL (1,000 mLs Intravenous New Bag/Given 04/03/20 2156)    Followed by  lactated ringers infusion (has no administration in time range)  hydrOXYzine (ATARAX/VISTARIL) tablet 25 mg (has no administration in time range)  loperamide (IMODIUM) capsule 2-4 mg (has no administration in time range)  chlordiazePOXIDE (LIBRIUM) capsule 25 mg (25 mg Oral Given 04/03/20 2151)    Followed by  chlordiazePOXIDE (LIBRIUM) capsule 25 mg (has no administration in time range)    Followed by  chlordiazePOXIDE (LIBRIUM) capsule 25 mg (has no administration in time range)    Followed by  chlordiazePOXIDE (LIBRIUM) capsule 25 mg (has no administration in time range)  Chlorhexidine Gluconate Cloth 2 % PADS 6 each (has no administration in time range)  amLODipine (NORVASC) tablet 5 mg (has no administration in time range)  atorvastatin (LIPITOR) tablet 20 mg (has no administration in time range)  metoprolol tartrate (LOPRESSOR) tablet 25 mg (25 mg Oral Given 04/03/20 2225)  lactated ringers bolus 1,000 mL (0 mLs Intravenous Stopped 04/03/20 1715)  lactated ringers bolus 1,550 mL (0 mLs Intravenous Stopped 04/03/20 1907)  ceFEPIme (MAXIPIME) 2 g in sodium chloride 0.9 % 100 mL IVPB (0 g Intravenous Stopped 04/03/20 1811)  vancomycin (VANCOREADY) IVPB 1750 mg/350 mL (0 mg Intravenous Stopped 04/03/20 2007)  LORazepam (ATIVAN) tablet 2 mg (2 mg Oral Given 04/03/20 1821)    ED Course  I have reviewed the triage vital signs and the nursing notes.  Pertinent labs & imaging results that were available during my care of the patient were reviewed by me and considered in my medical decision making (see chart for details).  Clinical Course as of 04/03/20 2336  Sat Apr 03, 2020  1659 D-Dimer, Quant(!): 0.57 Age adjusted normal.  [EH]  1926 I spoke with Dr. Posey Pronto who will see patient for admission.  [EH]    Clinical Course User Index [EH] Ollen Gross   MDM Rules/Calculators/A&P                         Patient is a 69 year old man who presents today for evaluation of suicidal ideation.  He went and bought a gun and told his neighbor he was going to use it to kill himself.  They then called [employer] who came and removed his firearm and sent him here. Patient reports he is still actively suicidal.  He denies homicidal ideation.  He also has been consuming a significant amount of alcohol.  He is tachycardic, tachypneic and borderline febrile with a temp of 100.  No clear source of infection however he also has significant leukocytosis at 13.8.  His initial lactic acid was markedly elevated at 9.2.  Given all of this he is started on sepsis antibiotics.  Patient's alcohol is slightly elevated still at 168.  He is given 30/kg fluid bolus.  He was not hypotensive while in my care.  I am unable to clear patient medically for psychiatric disposition. Hospitalist is consulted, I  spoke with hospitalist who will see patient for admission.  Patient will also need TTS consult prior to discharge and I suspect he may require admission to a psychiatric facility for further treatment.  The patient appears reasonably stabilized for admission considering the current resources, flow, and capabilities available in the ED at this time, and I doubt any other [facility] requiring further screening and/or treatment in the ED prior to admission assuming timely admission and bed placement.  Note: Portions of this report may have been transcribed using voice recognition software. Every effort was made to ensure accuracy; however, inadvertent computerized transcription errors may be present  Final Clinical Impression(s) / ED Diagnoses Final diagnoses:  Suicidal ideation  Alcohol abuse  Lactic acidosis    Rx / DC Orders ED Discharge Orders    None       Ollen Gross 04/03/20 2337    Gareth Morgan, MD 04/04/20 1216

## 2020-04-03 NOTE — ED Notes (Signed)
Date and time results received: 04/03/20 7:36 PM   Test: Lactic Acid Critical Value: 5.0  Name of Provider Notified: Tilden Fossa  Orders Received? Or Actions Taken?:

## 2020-04-03 NOTE — Progress Notes (Signed)
A consult was received from an ED provider for Vancomycin and Cefepime per pharmacy dosing.  The patient's profile has been reviewed for ht/wt/allergies/indication/available labs.    A one time order has been placed for Vancomycin 1750mg  IV and Cefepime 2g IV.  Further antibiotics/pharmacy consults should be ordered by admitting physician if indicated.                       Thank you, Luiz Ochoa 04/03/2020  5:04 PM

## 2020-04-03 NOTE — ED Notes (Signed)
Staff unable to get second set of Blood Cultures. Called phlebotomy who said they would come ASAP. Lactic Acid 9.2. Reported to Dr. Billy Fischer

## 2020-04-03 NOTE — ED Notes (Signed)
[place] WITH BELONGINGS PUT IN CABINET FOR ROOM 5. SHIRT, PANTS, BELT, WALLET, SHOES, WHITE PHONE CHARGER. THERE WAS NOT A TELEPHONE. PT WAS CONCERNED ABOUT THAT. IT WAS NOT PRESENT WHEN EMS BROUGHT PT IN.

## 2020-04-03 NOTE — ED Triage Notes (Addendum)
Brought in by EMS. Drinking binge since Tuesday. Has consumer 5 fifths of Vodka during that time.  He is voluntary.  Threatened to the neighbor that he might shoot himself. [employer] arrived and found a firearm and removed it.  He has had a problem with alcohol since 2016 with multiple detox opportunities. Rx for Wellbutrin and Lexapro but will not take them. Feels SI sporadically. Not eating much for past 5 days.   Last drink was at 11:00. EMS reports some tremors, dry heaving, general w/d symptoms. No other complaints. Ambulatory.

## 2020-04-03 NOTE — H&P (Signed)
History and Physical    Ruben [name] [id number] DOB: 02/01/1951 DOA: 04/03/2020  PCP: [name], MD  Patient coming from: Home via EMS  I have personally briefly reviewed patient's old medical records in [place]  Chief Complaint: Alcohol use, suicidal ideation  HPI: Ruben [name] is a 69 y.o. male with medical history significant for severe alcohol use disorder with history of withdrawal seizures and delirium tremens, hypertension, hyperlipidemia who presents to the ED via EMS with suicidal ideation and alcohol abuse concerns.  Patient states he has been drinking significant amount of vodka daily but not able to quantify for me when directly asked.  ED notes report he has been drinking a fifth of vodka daily.  Patient states he has been feeling depressed with a lot of stress and pressure due to social situations.  He apparently reported to his neighbor that he was going to shoot himself.  The neighbor called 911 and [employer] arrived to his home.  They found him with a gun which they took from him.  He was subsequently brought to the ED for further evaluation and management.  Patient states that he last used alcohol this morning (04/03/2020).  He has been having some nausea, vomiting, tremors, chills, diaphoresis.  He reports constipation.  He has occasional cough.  He denies chest pain or dyspnea.  He denies any homicidal ideation or hallucinations.  He denies any tobacco, marijuana, cocaine or IV drug use.  ED Course:  Initial vitals showed BP 151/97, pulse 144, RR 19, temp 100 F, SPO2 95% on room air.  Initial labs show WBC 18.3, hemoglobin 15.6, platelets 316,000, sodium 139, potassium 4.6, bicarb 12, BUN 28, creatinine 1.29, LFTs within normal limits, serum glucose 125, serum ethanol 168, lipase 18, magnesium 1.7.  Lactic acid 9.2 then 5.0 on repeat after initial fluids.  High-sensitivity troponin I 12 > 19.  D-dimer 0.57.  Acetaminophen and salicylate levels are  undetectable.  SARS-CoV-2 PCR is negative.  Influenza A/B PCR's are negative.  Blood and urine culture are obtained and pending.  Urinalysis pending.  Portable chest x-ray is negative for focal consolidation, edema, or effusion.  Patient was given 2.55 L LR, empiric IV antibiotics with vancomycin and cefepime.  He was given Ativan 2 mg orally and again via IV.  The hospitalist service was consulted to admit for further evaluation and management.  Review of Systems: All systems reviewed and are negative except as documented in history of present illness above.   Past Medical History:  Diagnosis Date  . Alcoholism ([place])   . Aortic atherosclerosis ([place]) 12/26/2016   Noted on CT  . Arthritis   . BPH (benign prostatic hyperplasia)   . Chronic constipation    takes magnesium  . Duodenal ulcer   . Dyspnea 12/17/2017  . ED (erectile dysfunction)   . Foley catheter in place    history of no longer in place  . GERD (gastroesophageal reflux disease)   . Heart palpitations    Occ  . Hiatal hernia   . History of adenomatous polyp of colon    tubular adenoma's 2005  . History of Helicobacter pylori infection    2008  . Hypercholesteremia   . Hypertension   . MR (mitral regurgitation)    Mild, noted on ECHO  . Pre-diabetes   . Pulmonary nodule 12/26/2016   noted on CT, 5 x 3 mm nodular opacity right upper lobe  . Rosacea   . Schatzki's ring  last dilated 02/ 2016  . Urinary retention    history of  . Varicocele 02/25/2014   Small to moderate left varicocele    Past Surgical History:  Procedure Laterality Date  . BALLOON DILATION N/A 06/08/2014   Procedure: BALLOON DILATION;  Surgeon: Garlan Fair, MD;  Location: [facility];  Service: Endoscopy;  Laterality: N/A;  . COLONOSCOPY WITH PROPOFOL N/A 06/08/2014   Procedure: COLONOSCOPY WITH PROPOFOL;  Surgeon: Garlan Fair, MD;  Location: [facility];  Service: Endoscopy;  Laterality: N/A;  . ESOPHAGOGASTRODUODENOSCOPY  N/A 06/08/2014   Procedure: ESOPHAGOGASTRODUODENOSCOPY (EGD);  Surgeon: Garlan Fair, MD;  Location: [facility];  Service: Endoscopy;  Laterality: N/A;  . ESOPHAGOGASTRODUODENOSCOPY (EGD) WITH ESOPHAGEAL DILATION N/A 12/31/2012   Procedure: ESOPHAGOGASTRODUODENOSCOPY (EGD) WITH ESOPHAGEAL DILATION;  Surgeon: Garlan Fair, MD;  Location: [facility];  Service: Endoscopy;  Laterality: N/A;  . GREEN LIGHT LASER TURP (TRANSURETHRAL RESECTION OF PROSTATE N/A 12/28/2015   Procedure: GREEN LIGHT LASER TURP (TRANSURETHRAL RESECTION OF PROSTATE;  Surgeon: Festus Aloe, MD;  Location: [facility];  Service: Urology;  Laterality: N/A;    Social History:  reports that he has never smoked. He has never used smokeless tobacco. He reports current alcohol use. He reports that he does not use drugs.  No Known Allergies  Family History  Problem Relation Age of Onset  . Heart failure Mother   . Stroke Father      Prior to Admission medications   Medication Sig Start Date End Date Taking? Authorizing Provider  amLODipine (NORVASC) 5 MG tablet Take 1 tablet (5 mg total) by mouth daily. 12/30/19 12/29/20 Yes Mercy Riding, MD  atorvastatin (LIPITOR) 20 MG tablet Take 1 tablet (20 mg total) by mouth daily. 10/06/19 12/22/19 Yes Alma Friendly, MD  feeding supplement, ENSURE ENLIVE, (ENSURE ENLIVE) LIQD Take 237 mLs by mouth 2 (two) times daily between meals. 12/30/19  Yes Mercy Riding, MD  folic acid (FOLVITE) 1 MG tablet Take 1 mg by mouth daily.   Yes [provider]  Magnesium 400 MG TABS Take 1,600 mg by mouth daily.   Yes [provider]  magnesium citrate SOLN Take 296 mLs (1 Bottle total) by mouth daily as needed for severe constipation. 12/30/19  Yes Mercy Riding, MD  metoprolol tartrate (LOPRESSOR) 25 MG tablet Take 1 tablet (25 mg total) by mouth 2 (two) times daily. 10/06/19 12/22/19 Yes Alma Friendly, MD  Multiple Vitamin (MULTIVITAMIN WITH  MINERALS) TABS tablet Take 1 tablet by mouth daily. 12/30/19  Yes Mercy Riding, MD  polyethylene glycol (MIRALAX / GLYCOLAX) 17 g packet Take 17 g by mouth 2 (two) times daily as needed for mild constipation. 12/30/19  Yes Mercy Riding, MD  senna-docusate (SENOKOT-S) 8.6-50 MG tablet Take 1 tablet by mouth 2 (two) times daily as needed for moderate constipation. 12/30/19  Yes Mercy Riding, MD  buPROPion (WELLBUTRIN XL) 150 MG 24 hr tablet Take 150 mg by mouth daily. 02/19/20   [provider]  busPIRone (BUSPAR) 5 MG tablet Take 1 tablet (5 mg total) by mouth 3 (three) times daily. Patient not taking: No sig reported 12/30/19   Mercy Riding, MD  clonazePAM (KLONOPIN) 0.5 MG tablet Take 1 tablet (0.5 mg total) by mouth 2 (two) times daily. Patient not taking: No sig reported 12/30/19   Mercy Riding, MD  doxepin (SINEQUAN) 10 MG capsule Take 10 mg by mouth at bedtime. Patient not taking: Reported  on 04/03/2020 03/17/20   [provider]  escitalopram (LEXAPRO) 20 MG tablet Take 20 mg by mouth daily. Patient not taking: Reported on 04/03/2020 02/19/20   [provider]    Physical Exam: Vitals:   04/03/20 1915 04/03/20 1930 04/03/20 1945 04/03/20 2017  BP: (!) 170/95 (!) 152/95 (!) 149/98 (!) 172/111  Pulse: (!) 114 (!) 122 (!) 113 (!) 113  Resp: 15 (!) 22 (!) 22 17  Temp:      TempSrc:      SpO2: 93% 95% 95% 95%  Weight:      Height:       Constitutional: Resting supine in bed, NAD, calm, comfortable Eyes: PERRL, lids and conjunctivae normal ENMT: Mucous membranes are moist. Posterior pharynx clear of any exudate or lesions.Normal dentition.  Neck: normal, supple, no masses. Respiratory: clear to auscultation bilaterally, no wheezing, no crackles. Normal respiratory effort. No accessory muscle use.  Cardiovascular: Tachycardic with regular rhythm, no murmurs / rubs / gallops. No extremity edema. 2+ pedal pulses. Abdomen: Slightly distended abdomen with no  tenderness, no masses palpated. Bowel sounds positive.  Musculoskeletal: no clubbing / cyanosis. No joint deformity upper and lower extremities. Good ROM, no contractures. Normal muscle tone.  Skin: no rashes, lesions, ulcers. No induration Neurologic: CN 2-12 grossly intact. Sensation intact, Strength 5/5 in all 4.  Slight tremors of both upper extremities without dysmetria. Psychiatric: Alert and oriented x 3.  Depressed mood.  Denies homicidal ideation or active hallucinations.  Labs on Admission: I have personally reviewed following labs and imaging studies  CBC: Recent Labs  Lab 04/03/20 1558  WBC 18.3*  NEUTROABS 14.4*  HGB 15.6  HCT 48.0  MCV 92.5  PLT [id number]   Basic Metabolic Panel: Recent Labs  Lab 04/03/20 1535 04/03/20 1558  NA  --  139  K  --  4.6  CL  --  102  CO2  --  12*  GLUCOSE  --  125*  BUN  --  28*  CREATININE  --  1.29*  CALCIUM  --  8.4*  MG 1.7  --    GFR: Estimated Creatinine Clearance: 58.4 mL/min (A) (by C-G formula based on SCr of 1.29 mg/dL (H)). Liver Function Tests: Recent Labs  Lab 04/03/20 1558  AST 39  ALT 36  ALKPHOS 76  BILITOT 1.2  PROT 7.6  ALBUMIN 4.1   Recent Labs  Lab 04/03/20 1535  LIPASE 18   No results for input(s): AMMONIA in the last 168 hours. Coagulation Profile: Recent Labs  Lab 04/03/20 1535  INR 1.1   Cardiac Enzymes: No results for input(s): CKTOTAL, CKMB, CKMBINDEX, TROPONINI in the last 168 hours. BNP (last 3 results) No results for input(s): PROBNP in the last 8760 hours. HbA1C: No results for input(s): HGBA1C in the last 72 hours. CBG: Recent Labs  Lab 04/03/20 1635  GLUCAP 114*   Lipid Profile: No results for input(s): CHOL, HDL, LDLCALC, TRIG, CHOLHDL, LDLDIRECT in the last 72 hours. Thyroid Function Tests: No results for input(s): TSH, T4TOTAL, FREET4, T3FREE, THYROIDAB in the last 72 hours. Anemia Panel: No results for input(s): VITAMINB12, FOLATE, FERRITIN, TIBC, IRON, RETICCTPCT in  the last 72 hours. Urine analysis:    Component Value Date/Time   COLORURINE YELLOW 12/22/2019 0626   APPEARANCEUR CLEAR 12/22/2019 0626   LABSPEC 1.017 12/22/2019 0626   PHURINE 5.0 12/22/2019 0626   GLUCOSEU NEGATIVE 12/22/2019 0626   HGBUR SMALL (A) 12/22/2019 0626   BILIRUBINUR NEGATIVE 12/22/2019 [id number]  KETONESUR 80 (A) 12/22/2019 0626   PROTEINUR 100 (A) 12/22/2019 0626   NITRITE NEGATIVE 12/22/2019 0626   LEUKOCYTESUR NEGATIVE 12/22/2019 0626    Radiological Exams on Admission: DG Chest Portable 1 View  Result Date: 04/03/2020 CLINICAL DATA:  69 year old male with alcohol withdrawal. EXAM: PORTABLE CHEST 1 VIEW COMPARISON:  Chest radiograph dated 10/01/2019. FINDINGS: Minimal left lung base atelectasis. No focal consolidation, pleural effusion, pneumothorax. Top-normal cardiac size. No acute osseous pathology. IMPRESSION: No active disease. Electronically Signed   By: Anner Crete M.D.   On: 04/03/2020 16:00    EKG: Personally reviewed. Sinus tachycardia, rate 142, motion artifact.  Rate is faster on prior EKG.  Assessment/Plan Principal Problem:   Alcohol use disorder, severe, dependence (HCC) Active Problems:   Essential hypertension   Hyperlipidemia   Suicidal ideation  Ruben [name] is a 69 y.o. male with medical history significant for severe alcohol use disorder with history of withdrawal seizures and delirium tremens, hypertension, hyperlipidemia who is admitted with severe alcohol use disorder and suicidal ideation.  Severe alcohol use disorder with history of withdrawal seizures and delirium tremens: Patient with ongoing daily alcohol use and very high risk for withdrawals and delirium tremens. -Place in stepdown unit with CIWA protocol -Start Librium taper with as needed Ativan -Thiamine, folic acid, multivitamin  Lactic acidosis/leukocytosis: Suspect secondary to alcohol use.  Lactic acidosis improving with IV fluids.  Give additional 1 L bolus  followed by maintenance IV fluids.  Repeat lactic acid.  Hold further antibiotics.  Follow urinalysis, urine culture, blood culture.  Suicidal ideation/depression: Patient reported suicidal intent and plan to use a gun before [employer] arrived to his home.  Will need psychiatry consult when medically stable.  Previously prescribed antidepressants but reportedly not adherent.  Continue suicide precautions with one-to-one sitter.  Patient currently here voluntarily.  Hypertension: Continue amlodipine, Lopressor.  Hyperlipidemia: Continue atorvastatin.  DVT prophylaxis: Lovenox Code Status: Full code Family Communication: Discussed with patient Disposition Plan: From home, dispo pending further management of alcohol use disorder and eventual psychiatry consultation Consults called: None, need psychiatry consult when medically stable Admission status:  Status is: Inpatient  Remains inpatient appropriate because:Unsafe d/c plan, IV treatments appropriate due to intensity of illness or inability to take PO and Inpatient level of care appropriate due to severity of illness   Dispo: The patient is from: Home              Anticipated d/c is to: Pending psych eval              Anticipated d/c date is: 3 days              Patient currently is not medically stable to d/c.   Zada Finders MD [facility]  If 7PM-7AM, please contact night-coverage [email or web address]  04/03/2020, 8:33 PM

## 2020-04-04 DIAGNOSIS — E785 Hyperlipidemia, unspecified: Secondary | ICD-10-CM | POA: Diagnosis not present

## 2020-04-04 DIAGNOSIS — R45851 Suicidal ideations: Secondary | ICD-10-CM

## 2020-04-04 DIAGNOSIS — F1020 Alcohol dependence, uncomplicated: Secondary | ICD-10-CM | POA: Diagnosis not present

## 2020-04-04 DIAGNOSIS — I10 Essential (primary) hypertension: Secondary | ICD-10-CM | POA: Diagnosis not present

## 2020-04-04 LAB — COMPREHENSIVE METABOLIC PANEL
ALT: 25 U/L (ref 0–44)
AST: 22 U/L (ref 15–41)
Albumin: 3.1 g/dL — ABNORMAL LOW (ref 3.5–5.0)
Alkaline Phosphatase: 54 U/L (ref 38–126)
Anion gap: 9 (ref 5–15)
BUN: 24 mg/dL — ABNORMAL HIGH (ref 8–23)
CO2: 24 mmol/L (ref 22–32)
Calcium: 8 mg/dL — ABNORMAL LOW (ref 8.9–10.3)
Chloride: 105 mmol/L (ref 98–111)
Creatinine, Ser: 1.09 mg/dL (ref 0.61–1.24)
GFR, Estimated: >60 mL/min (ref >60)
Glucose, Bld: 134 mg/dL — ABNORMAL HIGH (ref 70–99)
Potassium: 4.2 mmol/L (ref 3.5–5.1)
Sodium: 138 mmol/L (ref 135–145)
Total Bilirubin: 1.5 mg/dL — ABNORMAL HIGH (ref 0.3–1.2)
Total Protein: 5.7 g/dL — ABNORMAL LOW (ref 6.5–8.1)

## 2020-04-04 LAB — CBC
HCT: 39 % (ref 39.0–52.0)
Hemoglobin: 12.9 g/dL — ABNORMAL LOW (ref 13.0–17.0)
MCH: 29.9 pg (ref 26.0–34.0)
MCHC: 33.1 g/dL (ref 30.0–36.0)
MCV: 90.3 fL (ref 80.0–100.0)
Platelets: 213 10*3/uL (ref 150–400)
RBC: 4.32 MIL/uL (ref 4.22–5.81)
RDW: 12.6 % (ref 11.5–15.5)
WBC: 10 10*3/uL (ref 4.0–10.5)
nRBC: 0 % (ref 0.0–0.2)

## 2020-04-04 LAB — LACTIC ACID, PLASMA: Lactic Acid, Venous: 3.3 mmol/L (ref 0.5–1.9)

## 2020-04-04 LAB — MAGNESIUM: Magnesium: 1.6 mg/dL — ABNORMAL LOW (ref 1.7–2.4)

## 2020-04-04 MED ORDER — LACTATED RINGERS IV SOLN: INTRAVENOUS | Status: DC

## 2020-04-04 MED ORDER — MAGNESIUM SULFATE 2 GM/50ML IV SOLN
2.0000 g | Freq: Once | INTRAVENOUS | Status: AC
  Administered 2020-04-04: 2 g via INTRAVENOUS
  Filled 2020-04-04: qty 50

## 2020-04-04 MED ORDER — LACTATED RINGERS IV BOLUS
1000.0000 mL | Freq: Once | INTRAVENOUS | Status: AC
  Administered 2020-04-04: 1000 mL via INTRAVENOUS

## 2020-04-04 NOTE — Progress Notes (Signed)
Pt belongings (clothes, wallet, phone charger & shoes) were put in the lock box at the [facility].

## 2020-04-04 NOTE — Progress Notes (Signed)
CRITICAL VALUE ALERT  Critical Value:  Lactic 3.3   Date & Time Notied:  04/03/20 2300  Provider Notified: [facility]   Orders Received/Actions taken: none at this time
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
## 2020-04-04 NOTE — Consult Note (Addendum)
[facility] Face-to-Face Psychiatry Consult   Reason for Consult:  suicidal ideation/alcohol withdrawal Referring Physician:  Dr. Aline August Patient Identification: Ruben [name] MRN:  [id number] Principal Diagnosis: Suicidal ideation Diagnosis:  Principal Problem:   Suicidal ideation Active Problems:   Essential hypertension   Alcohol withdrawal ([place])   Alcohol use disorder, severe, dependence ([place])   Hyperlipidemia   Total Time spent with patient: 30 minutes  Subjective:   Ruben [name] is a 69 y.o. male patient with history of severe alcohol use disorder with history of DT, hypertension, hyperlipidemia, was admitted for suicidal ideation and alcohol withdrawal.    HPI:   -Per chart review, "He apparently reported to his neighbor that he was going to shoot himself.  The neighbor called 911 and [employer] arrived to his home.  They found him with a gun which they took from him. He apparently reported to his neighbor that he was going to shoot himself.  The neighbor called 911 and [employer] arrived to his home.  They found him with a gun which they took from him."  Since he was admitted to hospital, he was given lorazepam 2 mg IV, 2mg  PO, and librium 25 mg PO, Thiamine IV. EtOh 12/25 around 4 PM- 168 BP max 158/92, HR 70-99  He states that he feels fine now.  When he was asked about the incident which brought to the hospital, he admits that he was holding a gun to shoot himself.  He was feeling depressed for a week prior to admission after he received some note from the son of his cousin.  He declined to elaborate in details.  He states that he was stressed due to this, and has had significant anxiety, panic attacks, insomnia,  and relapsed on alcohol use after 11 days of sobriety. Last drinking was yesterday morning. When he is asked to reflect his suicide attempt, although he feels "good to be alive," he is unsure what he thinks about his attempt. Although he initially declined for inpatient psychiatry,  he is later more open to this after being provided psychoeducation.  However, he states that he first wants to focus on his treatment (for withdrawal) and would like to consider this later.   He received detox followed by rehab facility in [place], and has been sober for a total of 55 days prior to the recent relapse.  The longest sobriety was for 75 days.  He has been drinking 700 cc of liquors every day.  He had history of seizure, confusion in relate to alcohol withdrawal.  He denies any drug use.   He complains of insomnia.  Although he states that he was feeling depressed, he does not feel that way anymore.  He has fair appetite.  He has fair concentration.  He denies SI, HI.  He reports significant anxiety and daily panic attacks.  He denies AH, VH, paranoia.  He denies decreased need for sleep or euphoria. He denies diaphoresis, tremors, or palpitations.   Employment:  Retired in 2020, used to work for [employer] for 29 years Support: aunt Household: by himself Marital status: single  Number of children:0   Past Psychiatric History:  Denies psychiatry admission except for detox No outpatient psychiatrist  Risk to Self:  yes Risk to Others:  no Prior Inpatient Therapy:  no  Prior Outpatient Therapy:  no   Past Medical History:  Past Medical History:  Diagnosis Date  . Alcoholism ([place])   . Aortic atherosclerosis ([place]) 12/26/2016  Noted on CT  . Arthritis   . BPH (benign prostatic hyperplasia)   . Chronic constipation    takes magnesium  . Duodenal ulcer   . Dyspnea 12/17/2017  . ED (erectile dysfunction)   . Foley catheter in place    history of no longer in place  . GERD (gastroesophageal reflux disease)   . Heart palpitations    Occ  . Hiatal hernia   . History of adenomatous polyp of colon    tubular adenoma's 2005  . History of Helicobacter pylori infection    2008  . Hypercholesteremia   . Hypertension   . MR (mitral regurgitation)    Mild,  noted on ECHO  . Pre-diabetes   . Pulmonary nodule 12/26/2016   noted on CT, 5 x 3 mm nodular opacity right upper lobe  . Rosacea   . Schatzki's ring    last dilated 02/ 2016  . Urinary retention    history of  . Varicocele 02/25/2014   Small to moderate left varicocele    Past Surgical History:  Procedure Laterality Date  . BALLOON DILATION N/A 06/08/2014   Procedure: BALLOON DILATION;  Surgeon: Garlan Fair, MD;  Location: [facility];  Service: Endoscopy;  Laterality: N/A;  . COLONOSCOPY WITH PROPOFOL N/A 06/08/2014   Procedure: COLONOSCOPY WITH PROPOFOL;  Surgeon: Garlan Fair, MD;  Location: [facility];  Service: Endoscopy;  Laterality: N/A;  . ESOPHAGOGASTRODUODENOSCOPY N/A 06/08/2014   Procedure: ESOPHAGOGASTRODUODENOSCOPY (EGD);  Surgeon: Garlan Fair, MD;  Location: [facility];  Service: Endoscopy;  Laterality: N/A;  . ESOPHAGOGASTRODUODENOSCOPY (EGD) WITH ESOPHAGEAL DILATION N/A 12/31/2012   Procedure: ESOPHAGOGASTRODUODENOSCOPY (EGD) WITH ESOPHAGEAL DILATION;  Surgeon: Garlan Fair, MD;  Location: [facility];  Service: Endoscopy;  Laterality: N/A;  . GREEN LIGHT LASER TURP (TRANSURETHRAL RESECTION OF PROSTATE N/A 12/28/2015   Procedure: GREEN LIGHT LASER TURP (TRANSURETHRAL RESECTION OF PROSTATE;  Surgeon: Festus Aloe, MD;  Location: [facility];  Service: Urology;  Laterality: N/A;   Family History:  Family History  Problem Relation Age of Onset  . Heart failure Mother   . Stroke Father    Family Psychiatric  History:  As below Social History:  Social History   Substance and Sexual Activity  Alcohol Use Yes     Social History   Substance and Sexual Activity  Drug Use No    Social History   Socioeconomic History  . Marital status: Divorced    Spouse name: Not on file  . Number of children: Not on file  . Years of education: Not on file  . Highest education level: Not on file  Occupational History  . Not on file   Tobacco Use  . Smoking status: Never Smoker  . Smokeless tobacco: Never Used  Vaping Use  . Vaping Use: Never used  Substance and Sexual Activity  . Alcohol use: Yes  . Drug use: No  . Sexual activity: Not on file  Other Topics Concern  . Not on file  Social History Narrative  . Not on file   Social Determinants of Health   Financial Resource Strain: Not on file  Food Insecurity: Not on file  Transportation Needs: Not on file  Physical Activity: Not on file  Stress: Not on file  Social Connections: Not on file   Additional Social History:  Retired in 2020, used to work for [employer] for 29 years Single, no children  Allergies:  No Known Allergies  Labs:  Results  for orders placed or performed during the hospital encounter of 04/03/20 (from the past 48 hour(s))  Urine rapid drug screen (hosp performed)     Status: None   Collection Time: 04/03/20  3:17 PM  Result Value Ref Range   Opiates NONE DETECTED NONE DETECTED   Cocaine NONE DETECTED NONE DETECTED   Benzodiazepines NONE DETECTED NONE DETECTED   Amphetamines NONE DETECTED NONE DETECTED   Tetrahydrocannabinol NONE DETECTED NONE DETECTED   Barbiturates NONE DETECTED NONE DETECTED    Comment: (NOTE) DRUG SCREEN FOR MEDICAL PURPOSES ONLY.  IF CONFIRMATION IS NEEDED FOR ANY PURPOSE, NOTIFY LAB WITHIN 5 DAYS.  LOWEST DETECTABLE LIMITS FOR URINE DRUG SCREEN Drug Class                     Cutoff (ng/mL) Amphetamine and metabolites    1000 Barbiturate and metabolites    200 Benzodiazepine                 [id number] Tricyclics and metabolites     300 Opiates and metabolites        300 Cocaine and metabolites        300 THC                            50 Performed at [facility], [place] [address], [place], [place] [address]   Urinalysis, Routine w reflex microscopic     Status: Abnormal   Collection Time: 04/03/20  3:18 PM  Result Value Ref Range   Color, Urine YELLOW YELLOW    APPearance CLEAR CLEAR   Specific Gravity, Urine 1.019 1.005 - 1.030   pH 5.0 5.0 - 8.0   Glucose, UA NEGATIVE NEGATIVE mg/dL   Hgb urine dipstick SMALL (A) NEGATIVE   Bilirubin Urine NEGATIVE NEGATIVE   Ketones, ur 20 (A) NEGATIVE mg/dL   Protein, ur NEGATIVE NEGATIVE mg/dL   Nitrite NEGATIVE NEGATIVE   Leukocytes,Ua NEGATIVE NEGATIVE   WBC, UA 0-5 0 - 5 WBC/hpf   Bacteria, UA NONE SEEN NONE SEEN   Mucus PRESENT    Hyaline Casts, UA PRESENT    Granular Casts, UA PRESENT     Comment: Performed at [facility], [place] [address], [place], [place] [address]  Lactic acid, plasma     Status: Abnormal   Collection Time: 04/03/20  3:35 PM  Result Value Ref Range   Lactic Acid, Venous 9.2 (HH) 0.5 - 1.9 mmol/L    Comment: CRITICAL RESULT CALLED TO, READ BACK BY AND VERIFIED WITH: BATCHELOR,D. RN @1653  ON 12.25.2021 BY COHEN,K Performed at [facility], [place] [address], [place], [place] [address]   Protime-INR     Status: None   Collection Time: 04/03/20  3:35 PM  Result Value Ref Range   Prothrombin Time 13.3 11.4 - 15.2 seconds   INR 1.1 0.8 - 1.2    Comment: (NOTE) INR goal varies based on device and disease states. Performed at [facility], [place] [address] [place], [place] [address]   APTT     Status: None   Collection Time: 04/03/20  3:35 PM  Result Value Ref Range   aPTT 26 24 - 36 seconds    Comment: Performed at [facility], [place] [address], [place], [place] [address]  Blood Culture (routine x 2)     Status: None (Preliminary result)   Collection Time: 04/03/20  3:35 PM  Specimen: BLOOD  Result Value Ref Range   Specimen Description      BLOOD RIGHT WRIST Performed at [place] [address], [place], [place] [address]    Special Requests      BOTTLES DRAWN AEROBIC AND ANAEROBIC Blood Culture adequate volume Performed at [place] [address], [place], [place] [address]    Culture      NO GROWTH < 24 HOURS Performed at [place] [address] [place], [place] [address]    Report Status PENDING   Lipase, blood     Status: None   Collection Time: 04/03/20  3:35 PM  Result Value Ref Range   Lipase 18 11 - 51 U/L    Comment: Performed at [facility], [place] [address], [place], [place] [address]  Magnesium     Status: None   Collection Time: 04/03/20  3:35 PM  Result Value Ref Range   Magnesium 1.7 1.7 - 2.4 mg/dL    Comment: Performed at [facility], [place] [address] [place],  [address]  Resp Panel by RT-PCR (Flu A&B, Covid) Nasopharyngeal Swab     Status: None   Collection Time: 04/03/20  3:58 PM   Specimen: Nasopharyngeal Swab; Nasopharyngeal(NP) swabs in vial transport medium  Result Value Ref Range   SARS Coronavirus 2 by RT PCR NEGATIVE NEGATIVE    Comment: (NOTE) SARS-CoV-2 target nucleic acids are NOT DETECTED.  The SARS-CoV-2 RNA is generally detectable in upper respiratory specimens during the acute phase of infection. The lowest concentration of SARS-CoV-2 viral copies this assay can detect is 138 copies/mL. A negative result does not preclude SARS-Cov-2 infection and should not be used as the sole basis for treatment or other patient management decisions. A negative result may occur with  improper specimen collection/handling, submission of specimen other than nasopharyngeal swab, presence of viral mutation(s) within the areas targeted by this assay, and inadequate number of viral copies(<138 copies/mL). A negative result must be combined with clinical observations, patient history, and epidemiological information. The expected result is Negative.  Fact Sheet for Patients:  EntrepreneurPulse.com.au  Fact Sheet for Healthcare Providers:  IncredibleEmployment.be  This test is no t yet approved or cleared by the [place] FDA and  has been authorized for detection and/or diagnosis of SARS-CoV-2 by FDA under an Emergency Use Authorization (EUA). This EUA will remain  in effect (meaning this test can be used) for the duration of the COVID-19 declaration under Section 564(b)(1) of the Act, 21 U.S.C.section 360bbb-3(b)(1), unless the authorization is terminated  or revoked sooner.       Influenza A by PCR NEGATIVE NEGATIVE   Influenza B by PCR NEGATIVE NEGATIVE    Comment: (NOTE) The Xpert Xpress SARS-CoV-2/FLU/RSV plus assay is intended as an aid in the diagnosis of influenza from Nasopharyngeal swab specimens and should not be used as a sole basis for treatment. Nasal washings and aspirates are unacceptable for Xpert Xpress SARS-CoV-2/FLU/RSV testing.  Fact Sheet for Patients: EntrepreneurPulse.com.au  Fact Sheet for Healthcare Providers: IncredibleEmployment.be  This test is not yet approved or cleared by the [place] FDA and has been authorized for detection and/or diagnosis of SARS-CoV-2 by FDA under an Emergency Use Authorization (EUA). This EUA will remain in effect (meaning this test can be used) for the duration of the COVID-19 declaration under Section 564(b)(1) of the Act, 21 U.S.C. section 360bbb-3(b)(1), unless the authorization is terminated or revoked.  Performed at [facility], [place] [address] [place], [place] [address]   Comprehensive metabolic panel     Status: Abnormal   Collection Time: 04/03/20  3:58 PM  Result Value Ref Range   Sodium 139 135 - 145 mmol/L    Comment: REPEATED TO VERIFY   Potassium 4.6 3.5 - 5.1 mmol/L    Comment: REPEATED TO VERIFY   Chloride 102 98 - 111 mmol/L    Comment: REPEATED TO VERIFY   CO2 12 (L) 22 - 32 mmol/L    Comment: REPEATED TO VERIFY   Glucose, Bld 125 (H) 70 - 99 mg/dL    Comment: Glucose reference range applies only to samples taken after fasting for at least 8 hours.    BUN 28 (H) 8 - 23 mg/dL   Creatinine, Ser 1.29 (H) 0.61 - 1.24 mg/dL   Calcium 8.4 (L) 8.9 - 10.3 mg/dL    Comment: REPEATED TO VERIFY   Total Protein 7.6 6.5 - 8.1 g/dL   Albumin 4.1 3.5 - 5.0 g/dL   AST 39 15 - 41 U/L   ALT 36 0 - 44 U/L   Alkaline Phosphatase 76 38 - 126 U/L   Total Bilirubin 1.2 0.3 - 1.2 mg/dL   GFR, Estimated >60 >60 mL/min    Comment: (NOTE) Calculated using the CKD-EPI Creatinine Equation (2021)    Anion gap 25 (H) 5 - 15    Comment: Performed at [facility], [place] [address] [place], [place] [address]  Ethanol     Status: Abnormal   Collection Time: 04/03/20  3:58 PM  Result Value Ref Range   Alcohol, Ethyl (B) 168 (H) <10 mg/dL    Comment: (NOTE) Lowest detectable limit for serum alcohol is 10 mg/dL.  For medical purposes only. Performed at [facility], [place] [address] [place], [place] [address]   CBC with Diff     Status: Abnormal   Collection Time: 04/03/20  3:58 PM  Result Value Ref Range   WBC 18.3 (H) 4.0 - 10.5 K/uL   RBC 5.19 4.22 - 5.81 MIL/uL   Hemoglobin 15.6 13.0 - 17.0 g/dL   HCT 48.0 39.0 - 52.0 %   MCV 92.5 80.0 - 100.0 fL   MCH 30.1 26.0 - 34.0 pg   MCHC 32.5 30.0 - 36.0 g/dL   RDW 12.8 11.5 - 15.5 %   Platelets 316 150 - 400 K/uL   nRBC 0.0 0.0 - 0.2 %   Neutrophils Relative % 78 %   Neutro Abs 14.4 (H) 1.7 - 7.7 K/uL   Lymphocytes Relative 6 %   Lymphs Abs 1.1 0.7 - 4.0 K/uL   Monocytes Relative 5 %   Monocytes Absolute 0.8 0.1 - 1.0 K/uL   Eosinophils Relative 10 %   Eosinophils Absolute 1.8 (H) 0.0 - 0.5 K/uL   Basophils Relative 1 %   Basophils Absolute 0.1 0.0 - 0.1 K/uL   Immature Granulocytes 0 %   Abs Immature Granulocytes 0.08 (H) 0.00 - 0.07 K/uL    Comment: Performed at [facility], [place] [address], [place], [place] [id number]  Salicylate level     Status: Abnormal   Collection Time: 04/03/20  3:58 PM  Result Value Ref Range   Salicylate Lvl [id number]  (L) 7.0 - 30.0 mg/dL    Comment: Performed at [facility], [place] [address], [place], [place] [address]  Acetaminophen level     Status: Abnormal  Collection Time: 04/03/20  3:58 PM  Result Value Ref Range   Acetaminophen (Tylenol), Serum <10 (L) 10 - 30 ug/mL    Comment: (NOTE) Therapeutic concentrations vary significantly. A range of 10-30 ug/mL  may be an effective concentration for many patients. However, some  are best treated at concentrations outside of this range. Acetaminophen concentrations >150 ug/mL at 4 hours after ingestion  and >50 ug/mL at 12 hours after ingestion are often associated with  toxic reactions.  Performed at [facility], [place] [address] [place], [place] [address]   D-dimer, quantitative (not at [facility])     Status: Abnormal   Collection Time: 04/03/20  4:27 PM  Result Value Ref Range   D-Dimer, Quant 0.57 (H) 0.00 - 0.50 ug/mL-FEU    Comment: (NOTE) At the manufacturer cut-off value of 0.5 g/mL FEU, this assay has a negative predictive value of 95-100%.This assay is intended for use in conjunction with a clinical pretest probability (PTP) assessment model to exclude pulmonary embolism (PE) and deep venous thrombosis (DVT) in outpatients suspected of PE or DVT. Results should be correlated with clinical presentation. Performed at [facility], [place] [address], [place], [place] [address]   Troponin I (High Sensitivity)     Status: None   Collection Time: 04/03/20  4:27 PM  Result Value Ref Range   Troponin I (High Sensitivity) 12 <18 ng/L    Comment: (NOTE) Elevated high sensitivity troponin I (hsTnI) values and significant  changes across serial measurements may suggest ACS but many other  chronic and acute conditions are known to elevate hsTnI results.  Refer to the "Links" section for chest pain algorithms and additional  guidance. Performed at [facility], [place] [address], [place], [place] [address]   CBG monitoring, ED     Status: Abnormal   Collection Time: 04/03/20  4:35 PM  Result Value Ref Range   Glucose-Capillary 114 (H) 70 - 99 mg/dL    Comment: Glucose reference range applies only to samples taken after fasting for at least 8 hours.   Comment 1 Notify RN   Lactic acid, plasma     Status: Abnormal   Collection Time: 04/03/20  6:52 PM  Result Value Ref Range   Lactic Acid, Venous 5.0 (HH) 0.5 - 1.9 mmol/L    Comment: CRITICAL RESULT CALLED TO, READ BACK BY AND VERIFIED WITH: Dola [place] RN 04/03/20 @1935  BY P.HENDERSON Performed at [place] [address], [place], [place] [address]   Troponin I (High Sensitivity)     Status: Abnormal   Collection Time: 04/03/20  6:52 PM  Result Value Ref Range   Troponin I (High Sensitivity) 19 (H) <18 ng/L    Comment: (NOTE) Elevated high sensitivity troponin I (hsTnI) values and significant  changes across serial measurements may suggest ACS but many other  chronic and acute conditions are known to elevate hsTnI results.  Refer to the "Links" section for chest pain algorithms and additional  guidance. Performed at [facility], [place] [address], [place], [place] [address]   MRSA PCR Screening     Status: None   Collection Time: 04/03/20  9:24 PM   Specimen: Nasopharyngeal  Result Value Ref Range   MRSA by PCR NEGATIVE NEGATIVE    Comment:        The GeneXpert MRSA Assay (FDA approved for NASAL specimens only), is one component of a comprehensive MRSA colonization surveillance program. It is not intended  to diagnose MRSA infection nor to guide or monitor treatment for MRSA infections. Performed at [facility], [place] [address] [place], [place] [address]   Blood Culture (routine x 2)     Status: None (Preliminary result)   Collection Time: 04/03/20 10:56 PM   Specimen: BLOOD  Result Value Ref Range   Specimen Description       BLOOD LEFT ANTECUBITAL Performed at [facility], [place] [address] [place], [place] [address]    Special Requests      BOTTLES DRAWN AEROBIC ONLY Blood Culture adequate volume Performed at [place] [address] [place], [place] [address]    Culture      NO GROWTH < 24 HOURS Performed at [place] [address] [place], [place] [address]    Report Status PENDING   Lactic acid, plasma     Status: Abnormal   Collection Time: 04/03/20 10:56 PM  Result Value Ref Range   Lactic Acid, Venous 3.3 (HH) 0.5 - 1.9 mmol/L    Comment: CRITICAL RESULT CALLED TO, READ BACK BY AND VERIFIED WITH: Farrell Ours RN 04/04/20 @0010  BY P.HENDERSON Performed at [place] [address], [place], [place] [address]   Magnesium     Status: Abnormal   Collection Time: 04/04/20  2:09 AM  Result Value Ref Range   Magnesium 1.6 (L) 1.7 - 2.4 mg/dL    Comment: Performed at [facility], [place] [address] [place], [place] [address]  CBC     Status: Abnormal   Collection Time: 04/04/20  2:09 AM  Result Value Ref Range   WBC 10.0 4.0 - 10.5 K/uL   RBC 4.32 4.22 - 5.81 MIL/uL   Hemoglobin 12.9 (L) 13.0 - 17.0 g/dL   HCT 39.0 39.0 - 52.0 %   MCV 90.3 80.0 - 100.0 fL   MCH 29.9 26.0 - 34.0 pg   MCHC 33.1 30.0 - 36.0 g/dL   RDW 12.6 11.5 - 15.5 %   Platelets 213 150 - 400 K/uL   nRBC 0.0 0.0 - 0.2 %    Comment: Performed at [facility], [place] [address] [place], [place] [address]  Comprehensive metabolic panel     Status: Abnormal   Collection Time: 04/04/20  2:09 AM  Result Value Ref Range   Sodium 138 135 - 145 mmol/L   Potassium 4.2 3.5 - 5.1 mmol/L   Chloride 105 98 - 111 mmol/L   CO2 24 22 - 32 mmol/L   Glucose, Bld 134 (H) 70 - 99 mg/dL    Comment: Glucose reference range applies only to samples taken after fasting for at least 8 hours.   BUN 24 (H) 8 - 23 mg/dL   Creatinine, Ser 1.09  0.61 - 1.24 mg/dL   Calcium 8.0 (L) 8.9 - 10.3 mg/dL   Total Protein 5.7 (L) 6.5 - 8.1 g/dL   Albumin 3.1 (L) 3.5 - 5.0 g/dL   AST 22 15 - 41 U/L   ALT 25 0 - 44 U/L   Alkaline Phosphatase 54 38 - 126 U/L   Total Bilirubin 1.5 (H) 0.3 - 1.2 mg/dL   GFR, Estimated >60 >60 mL/min    Comment: (NOTE) Calculated using the CKD-EPI Creatinine Equation (2021)    Anion gap 9 5 - 15    Comment: Performed at [facility], [place] [address] [place],  [address]    Current Facility-Administered Medications  Medication Dose Route Frequency Provider Last Rate Last Admin  . acetaminophen (TYLENOL) tablet 650 mg  650 mg Oral Q6H PRN Lenore Cordia, MD       Or  . acetaminophen (TYLENOL) suppository 650 mg  650 mg Rectal Q6H PRN Zada Finders R, MD      . amLODipine (NORVASC) tablet 5 mg  5 mg Oral Daily Zada Finders R, MD   5 mg at 04/04/20 1015  . atorvastatin (LIPITOR) tablet 20 mg  20 mg Oral Daily Zada Finders R, MD   20 mg at 04/04/20 1014  . bisacodyl (DULCOLAX) EC tablet 5 mg  5 mg Oral Daily PRN Zada Finders R, MD      . chlordiazePOXIDE (LIBRIUM) capsule 25 mg  25 mg Oral QID Zada Finders R, MD   25 mg at 04/04/20 1548   Followed by  . chlordiazePOXIDE (LIBRIUM) capsule 25 mg  25 mg Oral TID Lenore Cordia, MD       Followed by  . [START ON 04/05/2020] chlordiazePOXIDE (LIBRIUM) capsule 25 mg  25 mg Oral BH-qamhs Lenore Cordia, MD       Followed by  . [START ON 04/06/2020] chlordiazePOXIDE (LIBRIUM) capsule 25 mg  25 mg Oral Daily Patel, Vishal R, MD      . Chlorhexidine Gluconate Cloth 2 % PADS 6 each  6 each Topical Daily Patel, Vishal R, MD      . enoxaparin (LOVENOX) injection 40 mg  40 mg Subcutaneous Q24H Lenore Cordia, MD   40 mg at 04/03/20 2153  . folic acid (FOLVITE) tablet 1 mg  1 mg Oral Daily Zada Finders R, MD   1 mg at 04/04/20 1015  . hydrOXYzine (ATARAX/VISTARIL) tablet 25 mg  25 mg Oral Q6H PRN Zada Finders R, MD      . lactated ringers  infusion   Intravenous Continuous Aline August, MD 100 mL/hr at 04/04/20 1526 Infusion Verify at 04/04/20 1526  . loperamide (IMODIUM) capsule 2-4 mg  2-4 mg Oral PRN Zada Finders R, MD      . LORazepam (ATIVAN) tablet 1-4 mg  1-4 mg Oral Q1H PRN Lenore Cordia, MD   1 mg at 04/04/20 0014   Or  . LORazepam (ATIVAN) injection 1-4 mg  1-4 mg Intravenous Q1H PRN Zada Finders R, MD      . metoprolol tartrate (LOPRESSOR) tablet 25 mg  25 mg Oral BID Zada Finders R, MD   25 mg at 04/04/20 1015  . multivitamin with minerals tablet 1 tablet  1 tablet Oral Daily Lenore Cordia, MD   1 tablet at 04/04/20 1016  . ondansetron (ZOFRAN) tablet 4 mg  4 mg Oral Q6H PRN Lenore Cordia, MD       Or  . ondansetron (ZOFRAN) injection 4 mg  4 mg Intravenous Q6H PRN Lenore Cordia, MD      . senna-docusate (Senokot-S) tablet 1 tablet  1 tablet Oral QHS PRN Zada Finders R, MD      . sodium chloride flush (NS) 0.9 % injection 3 mL  3 mL Intravenous Q12H Zada Finders R, MD   3 mL at 04/04/20 1023  . thiamine tablet 100 mg  100 mg Oral Daily Zada Finders R, MD   100 mg at 04/03/20 2151   Or  . thiamine (B-1) injection 100 mg  100 mg Intravenous Daily Lenore Cordia, MD   100 mg at 04/04/20 1014    Musculoskeletal: Strength &  Muscle Tone: N/A Gait & Station: N/A Patient leans: N/A  Psychiatric Specialty Exam: Physical Exam Vitals and nursing note reviewed.     Review of Systems  Psychiatric/Behavioral: Positive for sleep disturbance. Negative for agitation, behavioral problems, confusion, decreased concentration, dysphoric mood, hallucinations, self-injury and suicidal ideas. The patient is nervous/anxious. The patient is not hyperactive.   All other systems reviewed and are negative.   Blood pressure (!) 158/92, pulse 93, temperature 98.3 F (36.8 C), temperature source Oral, resp. rate 16, height 5\' 11"  (1.803 m), weight 94.3 kg, SpO2 94 %.Body mass index is 29 kg/m.  General Appearance: Fairly  Groomed  Eye Contact:  Good  Speech:  Clear and Coherent  Volume:  Normal  Mood:  "fine now"  Affect:  Appropriate, Congruent and slightly down  Thought Process:  Coherent and Goal Directed  Orientation:  Full (Time, Place, and Person)  Thought Content:  Logical  Suicidal Thoughts:  No  Homicidal Thoughts:  No  Memory:  Immediate;   Good  Judgement:  Fair  Insight:  Present  Psychomotor Activity:  Normal, no tremors  Concentration:  Concentration: Good and Attention Span: Good  Recall:  Good  Fund of Knowledge:  Good  Language:  Good  Akathisia:  No  Handed:  Right  AIMS (if indicated):     Assets:  Communication Skills Desire for Improvement  ADL's:  Intact  Cognition:  WNL  Sleep:   poor   Assessment Ruben [name] is a 69 y.o. male patient with history of severe alcohol use disorder with history of DT, hypertension, hyperlipidemia, was admitted for suicidal ideation and alcohol withdrawal.   # SI He reports worsening in depressive symptoms 1 week prior to admission in the context of receiving some note from the son of his cousin, although he declined to elaborate in details. He did attempt to kill himself with a gun in the context of alcohol use, which was intervened by the police.  Although he denies SI during the interview, he will need psychiatry admission given he is at high risk as assessed below. Although he did not decline this, he would like to consider this while he is getting medical treatment. He will need to be IVC'd for self harm if he were to decline psychiatry admission.   # Alcohol withdrawal No significant withdraw symptoms on exam except he has had hypertension and marginal tachycardia on metoprolol. He has history of DT. Would agree with  scheduled Librium taper with CIWA protocol to treat alcohol withdrawal.  Agree with IV thiamine to prevent Warnicke encephalopathy.   Treatment Plan Summary: Plan as below - Continue sitter with suicide precaution  -  Continue Librium taper with CIWA protocol - Continue IV thiamine  Disposition: Recommend psychiatric Inpatient admission when medically cleared.   The patient demonstrates the following risk factors for suicide: Chronic risk factors for suicide include: psychiatric disorder of depression and substance use disorder. Acute risk factors for suicide include: family or marital conflict, unemployment and recent suicide attempt of holding a gun to shoot himself. Protective factors for this patient include: hope for the future. Considering these factors, the overall suicide risk at this point appears to be high.   Thank you for your consult. Psychiatry will sign off patient at this time. Please consult psychiatry again as needed.  Norman Clay, MD 04/04/2020 5:07 PM

## 2020-04-04 NOTE — Plan of Care (Signed)
°  Problem: Education: Goal: Knowledge of disease or condition will improve Outcome: Progressing   Problem: Physical Regulation: Goal: Complications related to the disease process, condition or treatment will be avoided or minimized Outcome: Progressing   Problem: Safety: Goal: Ability to remain free from injury will improve Outcome: Progressing   Problem: Health Behavior/Discharge Planning: Goal: Ability to manage health-related needs will improve Outcome: Progressing   Problem: Clinical Measurements: Goal: Diagnostic test results will improve Outcome: Progressing   Problem: Coping: Goal: Level of anxiety will decrease Outcome: Not Progressing

## 2020-04-04 NOTE — Progress Notes (Signed)
Patient ID: Ruben [name], male   DOB: May 31, 1950, 70 y.o.   MRN: [id number]  PROGRESS NOTE    MD. HOOS  [name] DOB: Jan 01, 1951 DOA: 04/03/2020 PCP: Ruben Ladd, MD   Brief Narrative:  69 year old male with history of severe alcohol use disorder with history of withdrawal seizures and delirium tremens, hypertension, hyperlipidemia presented with suicidal ideation and alcohol abuse concerns.  Last alcohol was on 04/03/2020 morning.  Patient complained of nausea, vomiting, tremors, chills and diaphoresis.  On presentation, he was tachycardic with pulse of 144, temperature of 100 with white count of 18.3, bicarb of 12, creatinine of 1.29, LFTs within normal limits, lactic acid 9.2 with repeat of 5 after initial fluids.  Acetaminophen and salicylate levels were undetectable.  COVID-19 and influenza were negative.  Chest x-ray was negative for focal consolidation, edema or effusion.  He received empiric antibiotics and was treated with IV fluids and subsequently admitted to the hospital.  Assessment & Plan:   Alcohol withdrawal in a patient with severe alcohol use disorder with history of alcohol withdrawal seizures and delirium tremens -Continue monitoring in stepdown unit.  Continue CIWA protocol with Librium taper and Ativan as needed.  Continue multivitamin, folic acid, thiamine -Counseled patient regarding alcohol abstinence.  TOC consult.  Lactic acidosis/leukocytosis/acute metabolic acidosis -Probably secondary to alcohol abuse.  Lactic acidosis improving with IV fluids.  Patient was empirically given IV antibiotics on presentation.  No source of infection: Chest x-ray negative. -Monitoring off any further antibiotics.  Follow cultures. -Currently hemodynamically stable -Metabolic acidosis has resolved -Repeat a.m. WBCs  Suicidal ideation/depression -Continue one-to-one sitter.  Mental status currently stable.  Will request psych evaluation.  Hypertension -Continue  amlodipine and metoprolol  Hyperlipidemia -Continue statin  Hypomagnesemia -Replace.  Repeat a.m. labs  DVT prophylaxis: Lovenox Code Status: Full Family Communication: None at bedside Disposition Plan: Status is: Inpatient  Remains inpatient appropriate because:Inpatient level of care appropriate due to severity of illness   Dispo: The patient is from: Home              Anticipated d/c is to: Pending psych eval              Anticipated d/c date is: 2 days              Patient currently is not medically stable to d/c.   Consultants: Psychiatry consulted  Procedures: None  Antimicrobials: None   Subjective: Patient seen and examined at bedside.  Denies current nausea, vomiting, chest pain, shortness of breath.  Poor historian.  Objective: Vitals:   04/04/20 0300 04/04/20 0400 04/04/20 0500 04/04/20 0800  BP: (!) 154/86 (!) 152/87 131/86   Pulse: 78 81 91   Resp: 18 18 15    Temp:  97.6 F (36.4 C)  98 F (36.7 C)  TempSrc:  Oral  Oral  SpO2: 93% 92% 94%   Weight:      Height:        Intake/Output Summary (Last 24 hours) at 04/04/2020 1035 Last data filed at 04/04/2020 0700 Gross per 24 hour  Intake 1740.52 ml  Output 250 ml  Net 1490.52 ml   Filed Weights   04/03/20 1532 04/03/20 2100  Weight: 84.8 kg 94.3 kg    Examination:  General exam: Appears calm and comfortable.  Poor historian.  Looks older than stated age. Respiratory system: Bilateral decreased breath sounds at bases Cardiovascular system: S1 & S2 heard, Rate controlled Gastrointestinal system: Abdomen is nondistended, soft and nontender. Normal  bowel sounds heard. Extremities: No cyanosis, clubbing, edema  Central nervous system: Awake and alert.  No focal neurological deficits. Moving extremities.  Poor historian Skin: No rashes, lesions or ulcers Psychiatry: Flat affect.  Does not participate in conversation much.  Looks calm.     Data Reviewed: I have personally reviewed following  labs and imaging studies  CBC: Recent Labs  Lab 04/03/20 1558 04/04/20 0209  WBC 18.3* 10.0  NEUTROABS 14.4*  --   HGB 15.6 12.9*  HCT 48.0 39.0  MCV 92.5 90.3  PLT 316 [id number]   Basic Metabolic Panel: Recent Labs  Lab 04/03/20 1535 04/03/20 1558 04/04/20 0209  NA  --  139 138  K  --  4.6 4.2  CL  --  102 105  CO2  --  12* 24  GLUCOSE  --  125* 134*  BUN  --  28* 24*  CREATININE  --  1.29* 1.09  CALCIUM  --  8.4* 8.0*  MG 1.7  --  1.6*   GFR: Estimated Creatinine Clearance: 76.1 mL/min (by C-G formula based on SCr of 1.09 mg/dL). Liver Function Tests: Recent Labs  Lab 04/03/20 1558 04/04/20 0209  AST 39 22  ALT 36 25  ALKPHOS 76 54  BILITOT 1.2 1.5*  PROT 7.6 5.7*  ALBUMIN 4.1 3.1*   Recent Labs  Lab 04/03/20 1535  LIPASE 18   No results for input(s): AMMONIA in the last 168 hours. Coagulation Profile: Recent Labs  Lab 04/03/20 1535  INR 1.1   Cardiac Enzymes: No results for input(s): CKTOTAL, CKMB, CKMBINDEX, TROPONINI in the last 168 hours. BNP (last 3 results) No results for input(s): PROBNP in the last 8760 hours. HbA1C: No results for input(s): HGBA1C in the last 72 hours. CBG: Recent Labs  Lab 04/03/20 1635  GLUCAP 114*   Lipid Profile: No results for input(s): CHOL, HDL, LDLCALC, TRIG, CHOLHDL, LDLDIRECT in the last 72 hours. Thyroid Function Tests: No results for input(s): TSH, T4TOTAL, FREET4, T3FREE, THYROIDAB in the last 72 hours. Anemia Panel: No results for input(s): VITAMINB12, FOLATE, FERRITIN, TIBC, IRON, RETICCTPCT in the last 72 hours. Sepsis Labs: Recent Labs  Lab 04/03/20 1535 04/03/20 1852 04/03/20 2256  LATICACIDVEN 9.2* 5.0* 3.3*    Recent Results (from the past 240 hour(s))  Resp Panel by RT-PCR (Flu A&B, Covid) Nasopharyngeal Swab     Status: None   Collection Time: 04/03/20  3:58 PM   Specimen: Nasopharyngeal Swab; Nasopharyngeal(NP) swabs in vial transport medium  Result Value Ref Range Status   SARS  Coronavirus 2 by RT PCR NEGATIVE NEGATIVE Final    Comment: (NOTE) SARS-CoV-2 target nucleic acids are NOT DETECTED.  The SARS-CoV-2 RNA is generally detectable in upper respiratory specimens during the acute phase of infection. The lowest concentration of SARS-CoV-2 viral copies this assay can detect is 138 copies/mL. A negative result does not preclude SARS-Cov-2 infection and should not be used as the sole basis for treatment or other patient management decisions. A negative result may occur with  improper specimen collection/handling, submission of specimen other than nasopharyngeal swab, presence of viral mutation(s) within the areas targeted by this assay, and inadequate number of viral copies(<138 copies/mL). A negative result must be combined with clinical observations, patient history, and epidemiological information. The expected result is Negative.  Fact Sheet for Patients:  EntrepreneurPulse.com.au  Fact Sheet for Healthcare Providers:  IncredibleEmployment.be  This test is no t yet approved or cleared by the [place] FDA and  has been  authorized for detection and/or diagnosis of SARS-CoV-2 by FDA under an Emergency Use Authorization (EUA). This EUA will remain  in effect (meaning this test can be used) for the duration of the COVID-19 declaration under Section 564(b)(1) of the Act, 21 U.S.C.section 360bbb-3(b)(1), unless the authorization is terminated  or revoked sooner.       Influenza A by PCR NEGATIVE NEGATIVE Final   Influenza B by PCR NEGATIVE NEGATIVE Final    Comment: (NOTE) The Xpert Xpress SARS-CoV-2/FLU/RSV plus assay is intended as an aid in the diagnosis of influenza from Nasopharyngeal swab specimens and should not be used as a sole basis for treatment. Nasal washings and aspirates are unacceptable for Xpert Xpress SARS-CoV-2/FLU/RSV testing.  Fact Sheet for  Patients: EntrepreneurPulse.com.au  Fact Sheet for Healthcare Providers: IncredibleEmployment.be  This test is not yet approved or cleared by the [place] FDA and has been authorized for detection and/or diagnosis of SARS-CoV-2 by FDA under an Emergency Use Authorization (EUA). This EUA will remain in effect (meaning this test can be used) for the duration of the COVID-19 declaration under Section 564(b)(1) of the Act, 21 U.S.C. section 360bbb-3(b)(1), unless the authorization is terminated or revoked.  Performed at [facility], [place] [address], [place], [place] [address]   MRSA PCR Screening     Status: None   Collection Time: 04/03/20  9:24 PM   Specimen: Nasopharyngeal  Result Value Ref Range Status   MRSA by PCR NEGATIVE NEGATIVE Final    Comment:        The GeneXpert MRSA Assay (FDA approved for NASAL specimens only), is one component of a comprehensive MRSA colonization surveillance program. It is not intended to diagnose MRSA infection nor to guide or monitor treatment for MRSA infections. Performed at [facility], [place] [address] [place],  [address]          Radiology Studies: DG Chest Portable 1 View  Result Date: 04/03/2020 CLINICAL DATA:  69 year old male with alcohol withdrawal. EXAM: PORTABLE CHEST 1 VIEW COMPARISON:  Chest radiograph dated 10/01/2019. FINDINGS: Minimal left lung base atelectasis. No focal consolidation, pleural effusion, pneumothorax. Top-normal cardiac size. No acute osseous pathology. IMPRESSION: No active disease. Electronically Signed   By: Anner Crete M.D.   On: 04/03/2020 16:00        Scheduled Meds: . amLODipine  5 mg Oral Daily  . atorvastatin  20 mg Oral Daily  . chlordiazePOXIDE  25 mg Oral QID   Followed by  . chlordiazePOXIDE  25 mg Oral TID   Followed by  . [START ON 04/05/2020] chlordiazePOXIDE  25 mg Oral BH-qamhs    Followed by  . [START ON 04/06/2020] chlordiazePOXIDE  25 mg Oral Daily  . Chlorhexidine Gluconate Cloth  6 each Topical Daily  . enoxaparin (LOVENOX) injection  40 mg Subcutaneous Q24H  . folic acid  1 mg Oral Daily  . metoprolol tartrate  25 mg Oral BID  . multivitamin with minerals  1 tablet Oral Daily  . sodium chloride flush  3 mL Intravenous Q12H  . thiamine  100 mg Oral Daily   Or  . thiamine  100 mg Intravenous Daily   Continuous Infusions: . magnesium sulfate bolus IVPB 2 g (04/04/20 1029)          Aline August, MD [facility] 04/04/2020, 10:35 AM

## 2020-04-05 DIAGNOSIS — F1020 Alcohol dependence, uncomplicated: Secondary | ICD-10-CM | POA: Diagnosis not present

## 2020-04-05 DIAGNOSIS — R45851 Suicidal ideations: Secondary | ICD-10-CM | POA: Diagnosis not present

## 2020-04-05 DIAGNOSIS — E876 Hypokalemia: Secondary | ICD-10-CM

## 2020-04-05 DIAGNOSIS — F10230 Alcohol dependence with withdrawal, uncomplicated: Secondary | ICD-10-CM | POA: Diagnosis not present

## 2020-04-05 DIAGNOSIS — I10 Essential (primary) hypertension: Secondary | ICD-10-CM | POA: Diagnosis not present

## 2020-04-05 LAB — CBC WITH DIFFERENTIAL/PLATELET
Abs Immature Granulocytes: 0.01 10*3/uL (ref 0.00–0.07)
Basophils Absolute: 0 10*3/uL (ref 0.0–0.1)
Basophils Relative: 1 %
Eosinophils Absolute: 0.4 10*3/uL (ref 0.0–0.5)
Eosinophils Relative: 6 %
HCT: 37.2 % — ABNORMAL LOW (ref 39.0–52.0)
Hemoglobin: 12.3 g/dL — ABNORMAL LOW (ref 13.0–17.0)
Immature Granulocytes: 0 %
Lymphocytes Relative: 27 %
Lymphs Abs: 1.6 10*3/uL (ref 0.7–4.0)
MCH: 29.9 pg (ref 26.0–34.0)
MCHC: 33.1 g/dL (ref 30.0–36.0)
MCV: 90.5 fL (ref 80.0–100.0)
Monocytes Absolute: 0.6 10*3/uL (ref 0.1–1.0)
Monocytes Relative: 11 %
Neutro Abs: 3.2 10*3/uL (ref 1.7–7.7)
Neutrophils Relative %: 55 %
Platelets: 164 10*3/uL (ref 150–400)
RBC: 4.11 MIL/uL — ABNORMAL LOW (ref 4.22–5.81)
RDW: 12.5 % (ref 11.5–15.5)
WBC: 5.8 10*3/uL (ref 4.0–10.5)
nRBC: 0 % (ref 0.0–0.2)

## 2020-04-05 LAB — COMPREHENSIVE METABOLIC PANEL
ALT: 23 U/L (ref 0–44)
AST: 24 U/L (ref 15–41)
Albumin: 3 g/dL — ABNORMAL LOW (ref 3.5–5.0)
Alkaline Phosphatase: 49 U/L (ref 38–126)
Anion gap: 8 (ref 5–15)
BUN: 18 mg/dL (ref 8–23)
CO2: 27 mmol/L (ref 22–32)
Calcium: 8.1 mg/dL — ABNORMAL LOW (ref 8.9–10.3)
Chloride: 102 mmol/L (ref 98–111)
Creatinine, Ser: 1.06 mg/dL (ref 0.61–1.24)
GFR, Estimated: >60 mL/min (ref >60)
Glucose, Bld: 117 mg/dL — ABNORMAL HIGH (ref 70–99)
Potassium: 3.4 mmol/L — ABNORMAL LOW (ref 3.5–5.1)
Sodium: 137 mmol/L (ref 135–145)
Total Bilirubin: 1.2 mg/dL (ref 0.3–1.2)
Total Protein: 5.6 g/dL — ABNORMAL LOW (ref 6.5–8.1)

## 2020-04-05 LAB — MAGNESIUM: Magnesium: 1.8 mg/dL (ref 1.7–2.4)

## 2020-04-05 LAB — URINE CULTURE: Culture: NO GROWTH

## 2020-04-05 MED ORDER — POTASSIUM CHLORIDE CRYS ER 20 MEQ PO TBCR
40.0000 meq | EXTENDED_RELEASE_TABLET | Freq: Once | ORAL | Status: AC
  Administered 2020-04-05: 40 meq via ORAL
  Filled 2020-04-05: qty 2

## 2020-04-05 MED ORDER — POTASSIUM CHLORIDE CRYS ER 20 MEQ PO TBCR: 40.0000 meq | EXTENDED_RELEASE_TABLET | Freq: Once | ORAL | Status: DC

## 2020-04-05 NOTE — Progress Notes (Signed)
She is on the patient ID: Ruben [name], male   DOB: 1950/09/20, 69 y.o.   MRN: [id number]  PROGRESS NOTE    Ruben [name]  [id number] DOB: 1950/10/12 DOA: 04/03/2020 PCP: Ruben Shanks, MD   Brief Narrative:  69 year old male with history of severe alcohol use disorder with history of withdrawal seizures and delirium tremens, hypertension, hyperlipidemia presented with suicidal ideation and alcohol abuse concerns.  Last alcohol was on 04/03/2020 morning.  Patient complained of nausea, vomiting, tremors, chills and diaphoresis.  On presentation, he was tachycardic with pulse of 144, temperature of 100 with white count of 18.3, bicarb of 12, creatinine of 1.29, LFTs within normal limits, lactic acid 9.2 with repeat of 5 after initial fluids.  Acetaminophen and salicylate levels were undetectable.  COVID-19 and influenza were negative.  Chest x-ray was negative for focal consolidation, edema or effusion.  He received empiric antibiotics and was treated with IV fluids and subsequently admitted to the hospital.  Psychiatry was consulted.  Assessment & Plan:   Alcohol withdrawal in a patient with severe alcohol use disorder with history of alcohol withdrawal seizures and delirium tremens -Currently stable.  We will transfer him out of stepdown unit.  Continue CIWA protocol with Librium taper and Ativan as needed.  Continue multivitamin, folic acid, thiamine -Counseled patient regarding alcohol abstinence.  TOC consult.  Lactic acidosis/leukocytosis/acute metabolic acidosis -Probably secondary to alcohol abuse.  Lactic acidosis improving with IV fluids.  Patient was empirically given IV antibiotics on presentation.  No source of infection: Chest x-ray negative. -Monitoring off any further antibiotics.  Follow cultures. -Currently hemodynamically stable -Metabolic acidosis has resolved -Leukocytosis has resolved.  Hypokalemia -Replace.    Suicidal ideation/depression -Continue one-to-one  sitter.  Mental status currently stable.  Psych recommends inpatient psychiatric hospitalization once medically stable.  Hypertension -Continue amlodipine and metoprolol  Hyperlipidemia -Continue statin  Hypomagnesemia -Improved  DVT prophylaxis: Lovenox Code Status: Full Family Communication: None at bedside Disposition Plan: Status is: Inpatient  Remains inpatient appropriate because:Inpatient level of care appropriate due to severity of illness   Dispo: The patient is from: Home              Anticipated d/c is to:  inpatient psychiatric hospital              Anticipated d/c date is: 1 day              Patient currently is not medically stable to d/c.   Consultants: Psychiatry   Procedures: None  Antimicrobials: None   Subjective: Patient seen and examined at bedside.  Poor historian.  Feels anxious.  No overnight fever, vomiting reported.  Receiving intermittent Ativan as well as per nursing staff. Objective: Vitals:   04/05/20 0353 04/05/20 0408 04/05/20 0500 04/05/20 0600  BP:  (!) 142/84    Pulse:  65 64 (!) 59  Resp:  17 10 15   Temp: 97.6 F (36.4 C)     TempSrc: Axillary     SpO2:  97% 100% 100%  Weight:      Height:        Intake/Output Summary (Last 24 hours) at 04/05/2020 0719 Last data filed at 04/05/2020 0600 Gross per 24 hour  Intake 1463.35 ml  Output 2025 ml  Net -561.65 ml   Filed Weights   04/03/20 1532 04/03/20 2100  Weight: 84.8 kg 94.3 kg    Examination:  General exam: No acute distress.  Poor historian.  Looks older than stated age. Respiratory  system: Decreased breath sounds at bases bilaterally Cardiovascular system: Rate controlled, S1-S2 heard Gastrointestinal system: Abdomen is nondistended, soft and nontender.  Bowel sounds are heard  extremities: No edema or clubbing Central nervous system: Awake and oriented.  No focal neurological deficits.  Moves extremities  skin: No obvious ecchymosis/lesions Psychiatry: Affect is  flat.  Looks intermittently anxious.    Data Reviewed: I have personally reviewed following labs and imaging studies  CBC: Recent Labs  Lab 04/03/20 1558 04/04/20 0209 04/05/20 0145  WBC 18.3* 10.0 5.8  NEUTROABS 14.4*  --  3.2  HGB 15.6 12.9* 12.3*  HCT 48.0 39.0 37.2*  MCV 92.5 90.3 90.5  PLT 316 213 [id number]   Basic Metabolic Panel: Recent Labs  Lab 04/03/20 1535 04/03/20 1558 04/04/20 0209 04/05/20 0145  NA  --  139 138 137  K  --  4.6 4.2 3.4*  CL  --  102 105 102  CO2  --  12* 24 27  GLUCOSE  --  125* 134* 117*  BUN  --  28* 24* 18  CREATININE  --  1.29* 1.09 1.06  CALCIUM  --  8.4* 8.0* 8.1*  MG 1.7  --  1.6* 1.8   GFR: Estimated Creatinine Clearance: 78.2 mL/min (by C-G formula based on SCr of 1.06 mg/dL). Liver Function Tests: Recent Labs  Lab 04/03/20 1558 04/04/20 0209 04/05/20 0145  AST 39 22 24  ALT 36 25 23  ALKPHOS 76 54 49  BILITOT 1.2 1.5* 1.2  PROT 7.6 5.7* 5.6*  ALBUMIN 4.1 3.1* 3.0*   Recent Labs  Lab 04/03/20 1535  LIPASE 18   No results for input(s): AMMONIA in the last 168 hours. Coagulation Profile: Recent Labs  Lab 04/03/20 1535  INR 1.1   Cardiac Enzymes: No results for input(s): CKTOTAL, CKMB, CKMBINDEX, TROPONINI in the last 168 hours. BNP (last 3 results) No results for input(s): PROBNP in the last 8760 hours. HbA1C: No results for input(s): HGBA1C in the last 72 hours. CBG: Recent Labs  Lab 04/03/20 1635  GLUCAP 114*   Lipid Profile: No results for input(s): CHOL, HDL, LDLCALC, TRIG, CHOLHDL, LDLDIRECT in the last 72 hours. Thyroid Function Tests: No results for input(s): TSH, T4TOTAL, FREET4, T3FREE, THYROIDAB in the last 72 hours. Anemia Panel: No results for input(s): VITAMINB12, FOLATE, FERRITIN, TIBC, IRON, RETICCTPCT in the last 72 hours. Sepsis Labs: Recent Labs  Lab 04/03/20 1535 04/03/20 1852 04/03/20 2256  LATICACIDVEN 9.2* 5.0* 3.3*    Recent Results (from the past 240 hour(s))  Blood  Culture (routine x 2)     Status: None (Preliminary result)   Collection Time: 04/03/20  3:35 PM   Specimen: BLOOD  Result Value Ref Range Status   Specimen Description   Final    BLOOD RIGHT WRIST Performed at [facility] [address] [place], [place] [address]    Special Requests   Final    BOTTLES DRAWN AEROBIC AND ANAEROBIC Blood Culture adequate volume Performed at [facility] [address], [place], [place] [address]    Culture   Final    NO GROWTH 2 DAYS Performed at [facility] [address], [place], [place] [address]    Report Status PENDING  Incomplete  Resp Panel by RT-PCR (Flu A&B, Covid) Nasopharyngeal Swab     Status: None   Collection Time: 04/03/20  3:58 PM   Specimen: Nasopharyngeal Swab; Nasopharyngeal(NP) swabs in vial transport medium  Result Value Ref Range  Status   SARS Coronavirus 2 by RT PCR NEGATIVE NEGATIVE Final    Comment: (NOTE) SARS-CoV-2 target nucleic acids are NOT DETECTED.  The SARS-CoV-2 RNA is generally detectable in upper respiratory specimens during the acute phase of infection. The lowest concentration of SARS-CoV-2 viral copies this assay can detect is 138 copies/mL. A negative result does not preclude SARS-Cov-2 infection and should not be used as the sole basis for treatment or other patient management decisions. A negative result may occur with  improper specimen collection/handling, submission of specimen other than nasopharyngeal swab, presence of viral mutation(s) within the areas targeted by this assay, and inadequate number of viral copies(<138 copies/mL). A negative result must be combined with clinical observations, patient history, and epidemiological information. The expected result is Negative.  Fact Sheet for Patients:  EntrepreneurPulse.com.au  Fact Sheet for Healthcare Providers:  IncredibleEmployment.be  This test is no t yet  approved or cleared by the [place] FDA and  has been authorized for detection and/or diagnosis of SARS-CoV-2 by FDA under an Emergency Use Authorization (EUA). This EUA will remain  in effect (meaning this test can be used) for the duration of the COVID-19 declaration under Section 564(b)(1) of the Act, 21 U.S.C.section 360bbb-3(b)(1), unless the authorization is terminated  or revoked sooner.       Influenza A by PCR NEGATIVE NEGATIVE Final   Influenza B by PCR NEGATIVE NEGATIVE Final    Comment: (NOTE) The Xpert Xpress SARS-CoV-2/FLU/RSV plus assay is intended as an aid in the diagnosis of influenza from Nasopharyngeal swab specimens and should not be used as a sole basis for treatment. Nasal washings and aspirates are unacceptable for Xpert Xpress SARS-CoV-2/FLU/RSV testing.  Fact Sheet for Patients: EntrepreneurPulse.com.au  Fact Sheet for Healthcare Providers: IncredibleEmployment.be  This test is not yet approved or cleared by the [place] FDA and has been authorized for detection and/or diagnosis of SARS-CoV-2 by FDA under an Emergency Use Authorization (EUA). This EUA will remain in effect (meaning this test can be used) for the duration of the COVID-19 declaration under Section 564(b)(1) of the Act, 21 U.S.C. section 360bbb-3(b)(1), unless the authorization is terminated or revoked.  Performed at [facility], [place] [address] [place], [place] [address]   MRSA PCR Screening     Status: None   Collection Time: 04/03/20  9:24 PM   Specimen: Nasopharyngeal  Result Value Ref Range Status   MRSA by PCR NEGATIVE NEGATIVE Final    Comment:        The GeneXpert MRSA Assay (FDA approved for NASAL specimens only), is one component of a comprehensive MRSA colonization surveillance program. It is not intended to diagnose MRSA infection nor to guide or monitor treatment for MRSA infections. Performed at  [facility], [place] [address] [place], [place] [address]   Blood Culture (routine x 2)     Status: None (Preliminary result)   Collection Time: 04/03/20 10:56 PM   Specimen: BLOOD  Result Value Ref Range Status   Specimen Description   Final    BLOOD LEFT ANTECUBITAL Performed at [facility] [address] [place], [place] [address]    Special Requests   Final    BOTTLES DRAWN AEROBIC ONLY Blood Culture adequate volume Performed at [facility] [address] [place], [place] [address]    Culture   Final    NO GROWTH 1 DAY Performed at [facility], [place] [address], [place], [place]  [address]    Report Status PENDING  Incomplete         Radiology Studies: DG Chest Portable 1 View  Result Date: 04/03/2020 CLINICAL DATA:  69 year old male with alcohol withdrawal. EXAM: PORTABLE CHEST 1 VIEW COMPARISON:  Chest radiograph dated 10/01/2019. FINDINGS: Minimal left lung base atelectasis. No focal consolidation, pleural effusion, pneumothorax. Top-normal cardiac size. No acute osseous pathology. IMPRESSION: No active disease. Electronically Signed   By: Anner Crete M.D.   On: 04/03/2020 16:00        Scheduled Meds: . amLODipine  5 mg Oral Daily  . atorvastatin  20 mg Oral Daily  . chlordiazePOXIDE  25 mg Oral TID   Followed by  . chlordiazePOXIDE  25 mg Oral BH-qamhs   Followed by  . [START ON 04/06/2020] chlordiazePOXIDE  25 mg Oral Daily  . Chlorhexidine Gluconate Cloth  6 each Topical Daily  . enoxaparin (LOVENOX) injection  40 mg Subcutaneous Q24H  . folic acid  1 mg Oral Daily  . metoprolol tartrate  25 mg Oral BID  . multivitamin with minerals  1 tablet Oral Daily  . sodium chloride flush  3 mL Intravenous Q12H  . thiamine  100 mg Oral Daily   Or  . thiamine  100 mg Intravenous Daily   Continuous Infusions: . lactated ringers 100 mL/hr at 04/05/20 0018          Aline August,  MD [facility] 04/05/2020, 7:19 AM

## 2020-04-05 NOTE — TOC Initial Note (Addendum)
Transition of Care [facility]) - Initial/Assessment Note    Patient Details  Name: Ruben [name] MRN: [id number] Date of Birth: 1951/02/21  Transition of Care [facility]) CM/SW Contact:    Leeroy Cha, RN Phone Number: 04/05/2020, 8:11 AM  Clinical Narrative:                 He apparently reported to his neighbor that he was going to shoot himself. The neighbor called 911 and [employer] arrived to his home. They found him with a gun which they took from him."  Since he was admitted to hospital, he was given lorazepam 2 mg IV, 2mg  PO, and librium 25 mg PO, Thiamine IV. EtOh 12/25 around 4 PM- 168 BP max 158/92, HR 70-99  He states that he feels fine now.  When he was asked about the incident which brought to the hospital, he admits that he was holding a gun to shoot himself.  He was feeling depressed for a week prior to admission after he received some note from the son of his cousin.  He declined to elaborate in details.  He states that he was stressed due to this, and has had significant anxiety, panic attacks, insomnia,  and relapsed on alcohol use after 11 days of sobriety. Last drinking was yesterday morning. When he is asked to reflect his suicide attempt, although he feels "good to be alive," he is unsure what he thinks about his attempt. Although he initially declined for inpatient psychiatry, he is later more open to this after being provided psychoeducation.  However, he states that he first wants to focus on his treatment (for withdrawal) and would like to consider this later.   He received detox followed by rehab facility in [place], and has been sober for a total of 55 days prior to the recent relapse.  The longest sobriety was for 75 days.  He has been drinking 700 cc of liquors every day.  He had history of seizure, confusion in relate to alcohol withdrawal.  He denies any drug use.   He complains of insomnia.  Although he states that he was feeling depressed, he does not feel that way  anymore.  He has fair appetite.  He has fair concentration.  He denies SI, HI.  He reports significant anxiety and daily panic attacks.  He denies AH, VH, paranoia.  He denies decreased need for sleep or euphoria. He denies diaphoresis, tremors, or palpitations.   Employment:  Retired in 2020, used to work for [employer] for 29 years Support: aunt Household: by himself Marital status: single  Number of children:0  PLAN: will need bed at bbh Following for progression as above.  0954/[facility]ct-[facility] intake/message left on voice mail to please return call.  Pt may need geri-psych placement. Expected Discharge Plan: Psychiatric Hospital Barriers to Discharge: Continued Medical Work up   Patient Goals and CMS Choice Patient states their goals for this hospitalization and ongoing recovery are:: i feel fine now CMS Medicare.gov Compare Post Acute Care list provided to:: Patient    Expected Discharge Plan and Services Expected Discharge Plan: [facility]   Discharge Planning Services: CM Consult   Living arrangements for the past 2 months: Single Family Home                                      Prior Living Arrangements/Services Living arrangements for the past 2  months: Single Family Home Lives with:: Self Patient language and need for interpreter reviewed:: Yes Do you feel safe going back to the place where you live?: Yes      Need for Family Participation in Patient Care: Yes (Comment) Care giver support system in place?: Yes (comment)   Criminal Activity/Legal Involvement Pertinent to Current Situation/Hospitalization: No - Comment as needed  Activities of Daily Living Home Assistive Devices/Equipment: None ADL Screening (condition at time of admission) Patient's cognitive ability adequate to safely complete daily activities?: Yes Is the patient deaf or have difficulty hearing?: No Does the patient have difficulty seeing, even when wearing  glasses/contacts?: No Does the patient have difficulty concentrating, remembering, or making decisions?: Yes Patient able to express need for assistance with ADLs?: Yes Does the patient have difficulty dressing or bathing?: No Independently performs ADLs?: Yes (appropriate for developmental age) Does the patient have difficulty walking or climbing stairs?: No Weakness of Legs: Both Weakness of Arms/Hands: None  Permission Sought/Granted                  Emotional Assessment Appearance:: Appears stated age Attitude/Demeanor/Rapport: Engaged Affect (typically observed): Calm Orientation: : Oriented to Self,Oriented to Place,Oriented to  Time,Oriented to Situation Alcohol / Substance Use: Illicit Drugs,Alcohol Use,Tobacco Use Psych Involvement: Yes (comment)  Admission diagnosis:  Suicidal ideation [R45.851] Lactic acidosis [E87.2] Alcohol abuse [F10.10] Alcohol use disorder, severe, dependence (HCC) [F10.20] Patient Active Problem List   Diagnosis Date Noted  . Alcohol use disorder, severe, dependence (HCC) 04/03/2020  . Hyperlipidemia 04/03/2020  . Suicidal ideation 04/03/2020  . Alcoholic ketoacidosis 12/22/2019  . Thrombocytopenia (HCC) 12/22/2019  . ARF (acute renal failure) (HCC) 10/01/2019  . Alcohol withdrawal (HCC) 10/01/2019  . Normochromic normocytic anemia 10/01/2019  . GERD (gastroesophageal reflux disease) 06/22/2019  . BPH (benign prostatic hyperplasia) 06/22/2019  . Essential hypertension 06/22/2019  . AKI (acute kidney injury) (HCC)   . Alcohol intoxication (HCC) 06/21/2019  . S/P Nissen fundoplication (without gastrostomy tube) procedure 04/26/2018   PCP:  Ileana Ladd, MD Pharmacy:   [phone] [place], [place] - [address] [place] [address] Phone: [phone] Fax: [phone]     Social Determinants of Health (SDOH) Interventions    Readmission Risk Interventions No flowsheet data found.

## 2020-04-05 NOTE — TOC Progression Note (Signed)
Transition of Care [facility]) - Progression Note    Patient Details  Name: Ruben [name] MRN: [id number] Date of Birth: [birth year]  Transition of Care [facility]) CM/SW Contact  Golda Acre, RN Phone Number: 04/05/2020, 10:52 AM  Clinical Narrative:    Information for possible admission faxed to [facility] for geri-psych admission.  [facility] message left on voice mail in admissions to please call back.    Expected Discharge Plan: Psychiatric Hospital Barriers to Discharge: Continued Medical Work up  Expected Discharge Plan and Services Expected Discharge Plan: Psychiatric Hospital   Discharge Planning Services: CM Consult   Living arrangements for the past 2 months: Single Family Home                                       Social Determinants of Health (SDOH) Interventions    Readmission Risk Interventions No flowsheet data found.

## 2020-04-05 NOTE — Progress Notes (Signed)
Patient with increased anxiety, confusion, CIWA 15. Scheduled librium and PRN ativan administered. MD made aware, orders received to continue to monitor patient in stepdown unit for one more night and cancel transfer to med-surg.  Will continue to monitor.

## 2020-04-06 DIAGNOSIS — I10 Essential (primary) hypertension: Secondary | ICD-10-CM | POA: Diagnosis not present

## 2020-04-06 DIAGNOSIS — F1020 Alcohol dependence, uncomplicated: Secondary | ICD-10-CM | POA: Diagnosis not present

## 2020-04-06 DIAGNOSIS — R45851 Suicidal ideations: Secondary | ICD-10-CM | POA: Diagnosis not present

## 2020-04-06 DIAGNOSIS — F10230 Alcohol dependence with withdrawal, uncomplicated: Secondary | ICD-10-CM | POA: Diagnosis not present

## 2020-04-06 MED ORDER — LORAZEPAM 2 MG/ML IJ SOLN
2.0000 mg | Freq: Once | INTRAMUSCULAR | Status: AC
  Administered 2020-04-06: 2 mg via INTRAVENOUS
  Filled 2020-04-06: qty 1

## 2020-04-06 NOTE — Progress Notes (Signed)
She is on the patient ID: Ruben [name], male   DOB: 08/17/1950, 69 y.o.   MRN: [id number]  PROGRESS NOTE    Ruben [name]  PYP:[id number] DOB: [birth year] DOA: 04/03/2020 PCP: Ileana Ladd, MD   Brief Narrative:  69 year old male with history of severe alcohol use disorder with history of withdrawal seizures and delirium tremens, hypertension, hyperlipidemia presented with suicidal ideation and alcohol abuse concerns.  Last alcohol was on 04/03/2020 morning.  Patient complained of nausea, vomiting, tremors, chills and diaphoresis.  On presentation, he was tachycardic with pulse of 144, temperature of 100 with white count of 18.3, bicarb of 12, creatinine of 1.29, LFTs within normal limits, lactic acid 9.2 with repeat of 5 after initial fluids.  Acetaminophen and salicylate levels were undetectable.  COVID-19 and influenza were negative.  Chest x-ray was negative for focal consolidation, edema or effusion.  He received empiric antibiotics and was treated with IV fluids and subsequently admitted to the hospital.  Psychiatry was consulted who recommended inpatient psychiatric hospitalization once medically stable.  Assessment & Plan:   Alcohol withdrawal in a patient with severe alcohol use disorder with history of alcohol withdrawal seizures and delirium tremens -Continue CIWA protocol with Librium taper and Ativan as needed.  Still requiring intermittent Ativan for withdrawal.  Continue multivitamin, folic acid, thiamine -Counseled patient regarding alcohol abstinence.  TOC consult. -will transfer out of stepdown surgery.  Lactic acidosis/leukocytosis/acute metabolic acidosis -Probably secondary to alcohol abuse.  Lactic acidosis improving with IV fluids.  Patient was empirically given IV antibiotics on presentation.  No source of infection: Chest x-ray negative. -Monitoring off any further antibiotics.  Follow cultures. -Currently hemodynamically stable -Metabolic acidosis has  resolved -Leukocytosis has resolved.  Hypokalemia -No labs today.  Suicidal ideation/depression -Continue one-to-one sitter.  Patient was slightly more confused yesterday afternoon and was withdrawing requiring Ativan.  Psych recommends inpatient psychiatric hospitalization once medically stable.  Hypertension -Continue amlodipine and metoprolol  Hyperlipidemia -Continue statin  Hypomagnesemia -Improved  DVT prophylaxis: Lovenox Code Status: Full Family Communication: None at bedside Disposition Plan: Status is: Inpatient  Remains inpatient appropriate because:Inpatient level of care appropriate due to severity of illness   Dispo: The patient is from: Home              Anticipated d/c is to:  inpatient psychiatric hospital              Anticipated d/c date is: 1 day              Patient currently is not medically stable to d/c.   Consultants: Psychiatry   Procedures: None  Antimicrobials: None   Subjective: Patient seen and examined at bedside.  Bedside sitter present.  Nursing staff reports that patient received Ativan yesterday afternoon for withdrawal.  Poor historian.  No overnight fever or vomiting reported.  objective: Vitals:   04/06/20 0000 04/06/20 0300 04/06/20 0400 04/06/20 0600  BP:   (!) 164/89 (!) 151/93  Pulse: 98  73 77  Resp:   15 13  Temp:  98.1 F (36.7 C)    TempSrc:  Axillary    SpO2:   91% 93%  Weight:      Height:        Intake/Output Summary (Last 24 hours) at 04/06/2020 0721 Last data filed at 04/06/2020 0549 Gross per 24 hour  Intake 2365.53 ml  Output 3450 ml  Net -1084.47 ml   Filed Weights   04/03/20 1532 04/03/20 2100  Weight: 84.8 kg  94.3 kg    Examination:  General exam: Poor historian.  No distress.  Looks older than stated age. Respiratory system: Bilateral decreased breath sounds at bases with basilar crackles cardiovascular system: S1-S2 heard, rate controlled  gastrointestinal system: Abdomen is nondistended,  soft and nontender.  Normal bowel sounds heard  extremities: No cyanosis.  Trace lower extremity edema present. Central nervous system: Alert; slow to respond.  No focal neurological deficits.  Moving extremities  skin: No obvious petechiae/rashes Psychiatry: Anxious looking.   Data Reviewed: I have personally reviewed following labs and imaging studies  CBC: Recent Labs  Lab 04/03/20 1558 04/04/20 0209 04/05/20 0145  WBC 18.3* 10.0 5.8  NEUTROABS 14.4*  --  3.2  HGB 15.6 12.9* 12.3*  HCT 48.0 39.0 37.2*  MCV 92.5 90.3 90.5  PLT 316 213 161   Basic Metabolic Panel: Recent Labs  Lab 04/03/20 1535 04/03/20 1558 04/04/20 0209 04/05/20 0145  NA  --  139 138 137  K  --  4.6 4.2 3.4*  CL  --  102 105 102  CO2  --  12* 24 27  GLUCOSE  --  125* 134* 117*  BUN  --  28* 24* 18  CREATININE  --  1.29* 1.09 1.06  CALCIUM  --  8.4* 8.0* 8.1*  MG 1.7  --  1.6* 1.8   GFR: Estimated Creatinine Clearance: 78.2 mL/min (by C-G formula based on SCr of 1.06 mg/dL). Liver Function Tests: Recent Labs  Lab 04/03/20 1558 04/04/20 0209 04/05/20 0145  AST 39 22 24  ALT 36 25 23  ALKPHOS 76 54 49  BILITOT 1.2 1.5* 1.2  PROT 7.6 5.7* 5.6*  ALBUMIN 4.1 3.1* 3.0*   Recent Labs  Lab 04/03/20 1535  LIPASE 18   No results for input(s): AMMONIA in the last 168 hours. Coagulation Profile: Recent Labs  Lab 04/03/20 1535  INR 1.1   Cardiac Enzymes: No results for input(s): CKTOTAL, CKMB, CKMBINDEX, TROPONINI in the last 168 hours. BNP (last 3 results) No results for input(s): PROBNP in the last 8760 hours. HbA1C: No results for input(s): HGBA1C in the last 72 hours. CBG: Recent Labs  Lab 04/03/20 1635  GLUCAP 114*   Lipid Profile: No results for input(s): CHOL, HDL, LDLCALC, TRIG, CHOLHDL, LDLDIRECT in the last 72 hours. Thyroid Function Tests: No results for input(s): TSH, T4TOTAL, FREET4, T3FREE, THYROIDAB in the last 72 hours. Anemia Panel: No results for input(s):  VITAMINB12, FOLATE, FERRITIN, TIBC, IRON, RETICCTPCT in the last 72 hours. Sepsis Labs: Recent Labs  Lab 04/03/20 1535 04/03/20 1852 04/03/20 2256  LATICACIDVEN 9.2* 5.0* 3.3*    Recent Results (from the past 240 hour(s))  Blood Culture (routine x 2)     Status: None (Preliminary result)   Collection Time: 04/03/20  3:35 PM   Specimen: BLOOD  Result Value Ref Range Status   Specimen Description   Final    BLOOD RIGHT WRIST Performed at [facility] [address], [place], [place] [address]    Special Requests   Final    BOTTLES DRAWN AEROBIC AND ANAEROBIC Blood Culture adequate volume Performed at [facility] [address], [place], [place] [address]    Culture   Final    NO GROWTH 2 DAYS Performed at [facility] [address] [place], [place] [address]    Report Status PENDING  Incomplete  Urine culture     Status: None   Collection Time: 04/03/20  3:35  PM   Specimen: Urine, Catheterized  Result Value Ref Range Status   Specimen Description   Final    URINE, CATHETERIZED Performed at [facility], [address] [place], [place] [address]    Special Requests   Final    NONE Performed at [facility], [address], [place], [place] [address]    Culture   Final    NO GROWTH Performed at [facility], [address] [place], [place] [address]    Report Status 04/05/2020 FINAL  Final  Resp Panel by RT-PCR (Flu A&B, Covid) Nasopharyngeal Swab     Status: None   Collection Time: 04/03/20  3:58 PM   Specimen: Nasopharyngeal Swab; Nasopharyngeal(NP) swabs in vial transport medium  Result Value Ref Range Status   SARS Coronavirus 2 by RT PCR NEGATIVE NEGATIVE Final    Comment: (NOTE) SARS-CoV-2 target nucleic acids are NOT DETECTED.  The SARS-CoV-2 RNA is generally detectable in upper respiratory specimens during the acute phase of infection. The lowest concentration of  SARS-CoV-2 viral copies this assay can detect is 138 copies/mL. A negative result does not preclude SARS-Cov-2 infection and should not be used as the sole basis for treatment or other patient management decisions. A negative result may occur with  improper specimen collection/handling, submission of specimen other than nasopharyngeal swab, presence of viral mutation(s) within the areas targeted by this assay, and inadequate number of viral copies(<138 copies/mL). A negative result must be combined with clinical observations, patient history, and epidemiological information. The expected result is Negative.  Fact Sheet for Patients:  BloggerCourse.com  Fact Sheet for Healthcare Providers:  SeriousBroker.it  This test is no t yet approved or cleared by the [place] FDA and  has been authorized for detection and/or diagnosis of SARS-CoV-2 by FDA under an Emergency Use Authorization (EUA). This EUA will remain  in effect (meaning this test can be used) for the duration of the COVID-19 declaration under Section 564(b)(1) of the Act, 21 U.S.C.section 360bbb-3(b)(1), unless the authorization is terminated  or revoked sooner.       Influenza A by PCR NEGATIVE NEGATIVE Final   Influenza B by PCR NEGATIVE NEGATIVE Final    Comment: (NOTE) The Xpert Xpress SARS-CoV-2/FLU/RSV plus assay is intended as an aid in the diagnosis of influenza from Nasopharyngeal swab specimens and should not be used as a sole basis for treatment. Nasal washings and aspirates are unacceptable for Xpert Xpress SARS-CoV-2/FLU/RSV testing.  Fact Sheet for Patients: BloggerCourse.com  Fact Sheet for Healthcare Providers: SeriousBroker.it  This test is not yet approved or cleared by the [place] FDA and has been authorized for detection and/or diagnosis of SARS-CoV-2 by FDA under an Emergency Use  Authorization (EUA). This EUA will remain in effect (meaning this test can be used) for the duration of the COVID-19 declaration under Section 564(b)(1) of the Act, 21 U.S.C. section 360bbb-3(b)(1), unless the authorization is terminated or revoked.  Performed at [facility], [address] [place], [place] [address]   MRSA PCR Screening     Status: None   Collection Time: 04/03/20  9:24 PM   Specimen: Nasopharyngeal  Result Value Ref Range Status   MRSA by PCR NEGATIVE NEGATIVE Final    Comment:        The GeneXpert MRSA Assay (FDA approved for NASAL specimens only), is one component of a comprehensive MRSA colonization surveillance program. It is not intended to diagnose MRSA infection nor to  guide or monitor treatment for MRSA infections. Performed at [facility], [address], [place], [place] [address]   Blood Culture (routine x 2)     Status: None (Preliminary result)   Collection Time: 04/03/20 10:56 PM   Specimen: BLOOD  Result Value Ref Range Status   Specimen Description   Final    BLOOD LEFT ANTECUBITAL Performed at [facility], [address], [place], [place] [address]    Special Requests   Final    BOTTLES DRAWN AEROBIC ONLY Blood Culture adequate volume Performed at [facility], [address], [place], [place] [address]    Culture   Final    NO GROWTH 1 DAY Performed at [facility], [address], [place], [place] [address]    Report Status PENDING  Incomplete         Radiology Studies: No results found.      Scheduled Meds:  amLODipine  5 mg Oral Daily   atorvastatin  20 mg Oral Daily   chlordiazePOXIDE  25 mg Oral BH-qamhs   Followed by   chlordiazePOXIDE  25 mg Oral Daily   Chlorhexidine Gluconate Cloth  6 each Topical Daily   enoxaparin (LOVENOX) injection  40 mg Subcutaneous Q24H   folic acid  1 mg Oral Daily   metoprolol tartrate  25  mg Oral BID   multivitamin with minerals  1 tablet Oral Daily   sodium chloride flush  3 mL Intravenous Q12H   thiamine  100 mg Oral Daily   Or   thiamine  100 mg Intravenous Daily   Continuous Infusions:         Glade Lloyd, MD [facility] 04/06/2020, 7:21 AM

## 2020-04-06 NOTE — Plan of Care (Signed)
  Problem: Safety: Goal: Ability to remain free from injury will improve Outcome: Progressing   Problem: Clinical Measurements: Goal: Diagnostic test results will improve Outcome: Progressing   Problem: Coping: Goal: Level of anxiety will decrease Outcome: Progressing

## 2020-04-07 DIAGNOSIS — K219 Gastro-esophageal reflux disease without esophagitis: Secondary | ICD-10-CM | POA: Diagnosis not present

## 2020-04-07 DIAGNOSIS — G4700 Insomnia, unspecified: Secondary | ICD-10-CM | POA: Diagnosis not present

## 2020-04-07 DIAGNOSIS — G8929 Other chronic pain: Secondary | ICD-10-CM | POA: Diagnosis not present

## 2020-04-07 DIAGNOSIS — E782 Mixed hyperlipidemia: Secondary | ICD-10-CM | POA: Diagnosis not present

## 2020-04-07 DIAGNOSIS — I10 Essential (primary) hypertension: Secondary | ICD-10-CM | POA: Diagnosis not present

## 2020-04-07 DIAGNOSIS — N400 Enlarged prostate without lower urinary tract symptoms: Secondary | ICD-10-CM | POA: Diagnosis not present

## 2020-04-07 DIAGNOSIS — E7800 Pure hypercholesterolemia, unspecified: Secondary | ICD-10-CM | POA: Diagnosis not present

## 2020-04-07 DIAGNOSIS — R45851 Suicidal ideations: Secondary | ICD-10-CM | POA: Diagnosis not present

## 2020-04-07 LAB — BASIC METABOLIC PANEL
Anion gap: 11 (ref 5–15)
BUN: 15 mg/dL (ref 8–23)
CO2: 22 mmol/L (ref 22–32)
Calcium: 8.6 mg/dL — ABNORMAL LOW (ref 8.9–10.3)
Chloride: 104 mmol/L (ref 98–111)
Creatinine, Ser: 0.99 mg/dL (ref 0.61–1.24)
GFR, Estimated: >60 mL/min (ref >60)
Glucose, Bld: 116 mg/dL — ABNORMAL HIGH (ref 70–99)
Potassium: 3.6 mmol/L (ref 3.5–5.1)
Sodium: 137 mmol/L (ref 135–145)

## 2020-04-07 LAB — MAGNESIUM: Magnesium: 1.8 mg/dL (ref 1.7–2.4)

## 2020-04-07 MED ORDER — DIPHENHYDRAMINE HCL 25 MG PO CAPS
25.0000 mg | ORAL_CAPSULE | Freq: Every evening | ORAL | Status: DC | PRN
Start: 2020-04-07 — End: 2020-04-08
  Administered 2020-04-07: 25 mg via ORAL
  Filled 2020-04-07: qty 1

## 2020-04-07 MED ORDER — MELATONIN 3 MG PO TABS: 6.0000 mg | ORAL_TABLET | Freq: Every evening | ORAL | Status: DC | PRN

## 2020-04-07 NOTE — TOC Progression Note (Signed)
Transition of Care [facility]) - Progression Note    Patient Details  Name: FERGUS THRONE MRN: [id number] Date of Birth: [birth year]  [facility] CM/SW Contact  Golda Acre, RN Phone Number: 04/07/2020, 1:14 PM  Clinical Narrative:    [facility]-message left to call back with update. [phone]=message left  tct [facility]ss message left to return call. [facility]-jessica/did rec'd fax review in going.  Expected Discharge Plan: Psychiatric Hospital Barriers to Discharge: Continued Medical Work up  Expected Discharge Plan and Services Expected Discharge Plan: Psychiatric Hospital   Discharge Planning Services: CM Consult   Living arrangements for the past 2 months: Single Family Home                                       Social Determinants of Health (SDOH) Interventions    Readmission Risk Interventions No flowsheet data found.

## 2020-04-07 NOTE — Hospital Course (Signed)
Brief Narrative:  69 year old white male Ethanolism for many years (drinks 12 to 15 ounces of liquor daily-last drink reported 12/21/2019) with multiple visits to behavioral health? Admission for the same with deranged electrolytes secondary to the same HTN B12 deficiency BPH followed by urology Prior paraesophageal hernia status post Nissen fundoplication 04/2018 previously on pured diet   Recent admission 09/2019 EtOH/AKI   Came to [facility] long ED 9/13 secondary to binge drinking over the past weekend-in ED found to be sinus tachycardic 160s-was agitated-and unable to keep down p.o. Had tremors consistent with EtOH withdrawal Bicarb found to be 8 on admission, also had AKI Admitted for EtOH withdrawal, AKI, alcoholic ketoacidosis   Assessment & Plan:   Principal Problem:   Alcohol withdrawal (HCC) Active Problems:   GERD (gastroesophageal reflux disease)   Essential hypertension   Alcoholic ketoacidosis   Thrombocytopenia (HCC)     Nausea vomiting secondary to probable alcoholic gastritis in the setting of prior nisin fundoplication in 2020 Tolerating diet well this morning had a [place] sandwich and on that and taking Ensure now Continue current diet Diplopia Unlikely that he has had a stroke given quick improvement of the same CT done 09/2019 was negative for stroke but showed microvascular insults If persisting concerns and not improved by 9/15 get MRI brain rule out stroke AKI, /alcoholic acidosis on admission without concern for sepsis Anion gap is resolved-was 28 on admission, AKI slowly improving Baseline creatinine is 1.1 Volume replete LR +20 of K 75 cc/H in addition to potassium 40 mEq daily Repeat labs as below Chronic ethanolism with alcohol liver disease and thrombocytopenia Withdrawal scale is at 1 or 2 Add Librium for more sustained duration x125 mg Continue clonidine 0.1 twice daily in addition for withdrawal Sinus tachycardia  Secondary to withdrawal of prior to  admission metoprolol 25 twice daily-we added that back elevated blood sugar without a diagnosis of diabetes mellitus Will need outpatient check of the same No further work-up inpatient Deranged electrolytes as below Hyponatremia secondary to excess fluid in the setting of potomania Replacing hypokalemia IV and p.o. as above Leukocytosis on admission now resolved without intervention Macrocytic anemia secondary to above with thrombocytopenia likely related to volume resuscitation with D5 Outpatient check B12 folate etc. Needs to stop drinking Stop heparin, consider HIT and place on SCDs and check HIT panel and serotonin assay if continues to drop

## 2020-04-07 NOTE — Progress Notes (Signed)
She is on the patient ID: Ruben [name], male   DOB: Dec 13, 1950, 69 y.o.   MRN: [id number]  PROGRESS NOTE    Ruben [name]  [id number] DOB: 22-Sep-1950 DOA: 04/03/2020 PCP: [name], MD   Brief Narrative:  69 year old male with history of severe alcohol use disorder with history of withdrawal seizures and delirium tremens, hypertension, hyperlipidemia presented with suicidal ideation and alcohol abuse concerns.  Last alcohol was on 04/03/2020 morning.  Patient complained of nausea, vomiting, tremors, chills and diaphoresis.  On presentation, he was tachycardic with pulse of 144, temperature of 100 with white count of 18.3, bicarb of 12, creatinine of 1.29, LFTs within normal limits, lactic acid 9.2 with repeat of 5 after initial fluids.  Acetaminophen and salicylate levels were undetectable.  COVID-19 and influenza were negative.  Chest x-ray was negative for focal consolidation, edema or effusion.  He received empiric antibiotics and was treated with IV fluids and subsequently admitted to the hospital.  Psychiatry was consulted who recommended inpatient psychiatric hospitalization once medically stable.  Assessment & Plan:   Alcohol withdrawal in a patient with severe alcohol use disorder with history of alcohol withdrawal seizures and delirium tremens -Continue CIWA protocol with Librium taper and Ativan as needed - Continue multivitamin, folic acid, thiamine -Counseled patient regarding alcohol abstinence.  TOC consult. -can go to med surg today  Lactic acidosis/leukocytosis/acute metabolic acidosis -Probably secondary to alcohol abuse.  Lactic acidosis improving with IV fluids.  Patient was empirically given IV antibiotics on presentation.  No source of infection: Chest x-ray negative. -Monitoring off any further antibiotics.  Follow cultures. -Currently hemodynamically stable -Metabolic acidosis has resolved -Leukocytosis has resolved.  Hypokalemia -No labs today.  Suicidal  ideation/depression -Continue one-to-one sitter.   Patient was slightly more confused 12/27 CIWA 15 and was withdrawing requiring Ativan. ---this is resolved now -Psych recommends inpatient psychiatric hospitalization once medically stable.  Hypertension -Continue amlodipine and metoprolol  Hyperlipidemia -Continue statin  Hypomagnesemia -Improved  DVT prophylaxis: Lovenox Code Status: Full Family Communication: None at bedside Disposition Plan: Status is: Inpatient  Remains inpatient appropriate because:Inpatient level of care appropriate due to severity of illness   Dispo: The patient is from: Home              Anticipated d/c is to:  inpatient psychiatric hospital              Anticipated d/c date is: 1 day              Patient currently is not medically stable to d/c.   Consultants: Psychiatry   Procedures: None  Antimicrobials: None   Subjective:  Awake coherent asking good questions " where will I be going" No fever no cp   objective: Vitals:   04/07/20 0717 04/07/20 0800 04/07/20 1020 04/07/20 1200  BP:  (!) 152/91 (!) 146/101 129/85  Pulse:  77 84 100  Resp:  15  (!) 22  Temp: (!) 97.5 F (36.4 C)     TempSrc: Oral     SpO2:  96%  95%  Weight:      Height:        Intake/Output Summary (Last 24 hours) at 04/07/2020 1331 Last data filed at 04/07/2020 1003 Gross per 24 hour  Intake 2152 ml  Output 550 ml  Net 1602 ml   Filed Weights   04/03/20 1532 04/03/20 2100  Weight: 84.8 kg 94.3 kg    Examination:  EOMI NCAT no focal deficit thick neck Chest  clear no added sound no rales no rhonchi S1-S2 no murmur Abdomen soft no rebound no guarding Neurologically intact no focal deficit Psych euthymic flat affect   Data Reviewed: I have personally reviewed following labs and imaging studies  CBC: Recent Labs  Lab 04/03/20 1558 04/04/20 0209 04/05/20 0145  WBC 18.3* 10.0 5.8  NEUTROABS 14.4*  --  3.2  HGB 15.6 12.9* 12.3*  HCT 48.0 39.0  37.2*  MCV 92.5 90.3 90.5  PLT 316 213 [id number]   Basic Metabolic Panel: Recent Labs  Lab 04/03/20 1535 04/03/20 1558 04/04/20 0209 04/05/20 0145 04/07/20 0231  NA  --  139 138 137 137  K  --  4.6 4.2 3.4* 3.6  CL  --  102 105 102 104  CO2  --  12* 24 27 22   GLUCOSE  --  125* 134* 117* 116*  BUN  --  28* 24* 18 15  CREATININE  --  1.29* 1.09 1.06 0.99  CALCIUM  --  8.4* 8.0* 8.1* 8.6*  MG 1.7  --  1.6* 1.8 1.8   GFR: Estimated Creatinine Clearance: 83.7 mL/min (by C-G formula based on SCr of 0.99 mg/dL). Liver Function Tests: Recent Labs  Lab 04/03/20 1558 04/04/20 0209 04/05/20 0145  AST 39 22 24  ALT 36 25 23  ALKPHOS 76 54 49  BILITOT 1.2 1.5* 1.2  PROT 7.6 5.7* 5.6*  ALBUMIN 4.1 3.1* 3.0*   Recent Labs  Lab 04/03/20 1535  LIPASE 18   No results for input(s): AMMONIA in the last 168 hours. Coagulation Profile: Recent Labs  Lab 04/03/20 1535  INR 1.1   Cardiac Enzymes: No results for input(s): CKTOTAL, CKMB, CKMBINDEX, TROPONINI in the last 168 hours. BNP (last 3 results) No results for input(s): PROBNP in the last 8760 hours. HbA1C: No results for input(s): HGBA1C in the last 72 hours. CBG: Recent Labs  Lab 04/03/20 1635  GLUCAP 114*   Lipid Profile: No results for input(s): CHOL, HDL, LDLCALC, TRIG, CHOLHDL, LDLDIRECT in the last 72 hours. Thyroid Function Tests: No results for input(s): TSH, T4TOTAL, FREET4, T3FREE, THYROIDAB in the last 72 hours. Anemia Panel: No results for input(s): VITAMINB12, FOLATE, FERRITIN, TIBC, IRON, RETICCTPCT in the last 72 hours. Sepsis Labs: Recent Labs  Lab 04/03/20 1535 04/03/20 1852 04/03/20 2256  LATICACIDVEN 9.2* 5.0* 3.3*    Recent Results (from the past 240 hour(s))  Blood Culture (routine x 2)     Status: None (Preliminary result)   Collection Time: 04/03/20  3:35 PM   Specimen: BLOOD  Result Value Ref Range Status   Specimen Description   Final    BLOOD RIGHT WRIST Performed at [facility] [address], [place], [place] [address]    Special Requests   Final    BOTTLES DRAWN AEROBIC AND ANAEROBIC Blood Culture adequate volume Performed at [facility] [address], [place], [place] [address]    Culture   Final    NO GROWTH 4 DAYS Performed at [facility], [place] [address] [place], [place] [address]    Report Status PENDING  Incomplete  Urine culture     Status: None   Collection Time: 04/03/20  3:35 PM   Specimen: Urine, Catheterized  Result Value Ref Range Status   Specimen Description   Final    URINE, CATHETERIZED Performed at [facility] [address] [place], [place] [address]    Special Requests   Final  NONE Performed at [facility], [address], [place], [place] [address]    Culture   Final    NO GROWTH Performed at [facility], [address] [place], [place] [address]    Report Status 04/05/2020 FINAL  Final  Resp Panel by RT-PCR (Flu A&B, Covid) Nasopharyngeal Swab     Status: None   Collection Time: 04/03/20  3:58 PM   Specimen: Nasopharyngeal Swab; Nasopharyngeal(NP) swabs in vial transport medium  Result Value Ref Range Status   SARS Coronavirus 2 by RT PCR NEGATIVE NEGATIVE Final    Comment: (NOTE) SARS-CoV-2 target nucleic acids are NOT DETECTED.  The SARS-CoV-2 RNA is generally detectable in upper respiratory specimens during the acute phase of infection. The lowest concentration of SARS-CoV-2 viral copies this assay can detect is 138 copies/mL. A negative result does not preclude SARS-Cov-2 infection and should not be used as the sole basis for treatment or other patient management decisions. A negative result may occur with  improper specimen collection/handling, submission of specimen other than nasopharyngeal swab, presence of viral mutation(s) within the areas targeted by this assay, and inadequate number of viral copies(<138  copies/mL). A negative result must be combined with clinical observations, patient history, and epidemiological information. The expected result is Negative.  Fact Sheet for Patients:  BloggerCourse.com  Fact Sheet for Healthcare Providers:  SeriousBroker.it  This test is no t yet approved or cleared by the [place] FDA and  has been authorized for detection and/or diagnosis of SARS-CoV-2 by FDA under an Emergency Use Authorization (EUA). This EUA will remain  in effect (meaning this test can be used) for the duration of the COVID-19 declaration under Section 564(b)(1) of the Act, 21 U.S.C.section 360bbb-3(b)(1), unless the authorization is terminated  or revoked sooner.       Influenza A by PCR NEGATIVE NEGATIVE Final   Influenza B by PCR NEGATIVE NEGATIVE Final    Comment: (NOTE) The Xpert Xpress SARS-CoV-2/FLU/RSV plus assay is intended as an aid in the diagnosis of influenza from Nasopharyngeal swab specimens and should not be used as a sole basis for treatment. Nasal washings and aspirates are unacceptable for Xpert Xpress SARS-CoV-2/FLU/RSV testing.  Fact Sheet for Patients: BloggerCourse.com  Fact Sheet for Healthcare Providers: SeriousBroker.it  This test is not yet approved or cleared by the [place] FDA and has been authorized for detection and/or diagnosis of SARS-CoV-2 by FDA under an Emergency Use Authorization (EUA). This EUA will remain in effect (meaning this test can be used) for the duration of the COVID-19 declaration under Section 564(b)(1) of the Act, 21 U.S.C. section 360bbb-3(b)(1), unless the authorization is terminated or revoked.  Performed at [facility], [address], [place], [place] [address]   MRSA PCR Screening     Status: None   Collection Time: 04/03/20  9:24 PM   Specimen: Nasopharyngeal  Result Value Ref  Range Status   MRSA by PCR NEGATIVE NEGATIVE Final    Comment:        The GeneXpert MRSA Assay (FDA approved for NASAL specimens only), is one component of a comprehensive MRSA colonization surveillance program. It is not intended to diagnose MRSA infection nor to guide or monitor treatment for MRSA infections. Performed at [facility], [address] [place], [place] [address]   Blood Culture (routine x 2)     Status: None (Preliminary result)   Collection Time: 04/03/20 10:56 PM   Specimen: BLOOD  Result  Value Ref Range Status   Specimen Description   Final    BLOOD LEFT ANTECUBITAL Performed at [facility] [address] [place], [place] [address]    Special Requests   Final    BOTTLES DRAWN AEROBIC ONLY Blood Culture adequate volume Performed at [facility] [address] [place], [place] [address]    Culture   Final    NO GROWTH 3 DAYS Performed at [facility], [place] [address], [place], [place] [address]    Report Status PENDING  Incomplete         Radiology Studies: No results found.      Scheduled Meds: . amLODipine  5 mg Oral Daily  . atorvastatin  20 mg Oral Daily  . Chlorhexidine Gluconate Cloth  6 each Topical Daily  . enoxaparin (LOVENOX) injection  40 mg Subcutaneous Q24H  . folic acid  1 mg Oral Daily  . metoprolol tartrate  25 mg Oral BID  . multivitamin with minerals  1 tablet Oral Daily  . sodium chloride flush  3 mL Intravenous Q12H  . thiamine  100 mg Oral Daily   Or  . thiamine  100 mg Intravenous Daily   Continuous Infusions:         Nita Sells, MD [facility] 04/07/2020, 1:31 PM

## 2020-04-07 NOTE — Plan of Care (Signed)
  Problem: Education: Goal: Knowledge of disease or condition will improve Outcome: Progressing Goal: Understanding of discharge needs will improve Outcome: Progressing   Problem: Health Behavior/Discharge Planning: Goal: Ability to identify changes in lifestyle to reduce recurrence of condition will improve Outcome: Progressing Goal: Identification of resources available to assist in meeting health care needs will improve Outcome: Progressing   Problem: Physical Regulation: Goal: Complications related to the disease process, condition or treatment will be avoided or minimized Outcome: Progressing   Problem: Safety: Goal: Ability to remain free from injury will improve Outcome: Progressing   Problem: Education: Goal: Knowledge of General Education information will improve Description: Including pain rating scale, medication(s)/side effects and non-pharmacologic comfort measures Outcome: Progressing   Problem: Health Behavior/Discharge Planning: Goal: Ability to manage health-related needs will improve Outcome: Progressing   Problem: Clinical Measurements: Goal: Ability to maintain clinical measurements within normal limits will improve Outcome: Progressing Goal: Will remain free from infection Outcome: Progressing Goal: Diagnostic test results will improve Outcome: Progressing Goal: Respiratory complications will improve Outcome: Progressing Goal: Cardiovascular complication will be avoided Outcome: Progressing   Problem: Activity: Goal: Risk for activity intolerance will decrease Outcome: Progressing   Problem: Nutrition: Goal: Adequate nutrition will be maintained Outcome: Progressing   Problem: Coping: Goal: Level of anxiety will decrease Outcome: Progressing   Problem: Elimination: Goal: Will not experience complications related to bowel motility Outcome: Progressing Goal: Will not experience complications related to urinary retention Outcome:  Progressing   Problem: Pain Managment: Goal: General experience of comfort will improve Outcome: Progressing   Problem: Safety: Goal: Ability to remain free from injury will improve Outcome: Progressing   Problem: Skin Integrity: Goal: Risk for impaired skin integrity will decrease Outcome: Progressing   

## 2020-04-08 DIAGNOSIS — R634 Abnormal weight loss: Secondary | ICD-10-CM | POA: Insufficient documentation

## 2020-04-08 DIAGNOSIS — E7800 Pure hypercholesterolemia, unspecified: Secondary | ICD-10-CM | POA: Insufficient documentation

## 2020-04-08 DIAGNOSIS — I499 Cardiac arrhythmia, unspecified: Secondary | ICD-10-CM | POA: Insufficient documentation

## 2020-04-08 DIAGNOSIS — R2681 Unsteadiness on feet: Secondary | ICD-10-CM | POA: Insufficient documentation

## 2020-04-08 DIAGNOSIS — R45851 Suicidal ideations: Secondary | ICD-10-CM | POA: Diagnosis not present

## 2020-04-08 DIAGNOSIS — G4700 Insomnia, unspecified: Secondary | ICD-10-CM | POA: Insufficient documentation

## 2020-04-08 DIAGNOSIS — R2232 Localized swelling, mass and lump, left upper limb: Secondary | ICD-10-CM | POA: Insufficient documentation

## 2020-04-08 LAB — CBC WITH DIFFERENTIAL/PLATELET
Abs Immature Granulocytes: 0.01 10*3/uL (ref 0.00–0.07)
Basophils Absolute: 0.1 10*3/uL (ref 0.0–0.1)
Basophils Relative: 1 %
Eosinophils Absolute: 0.5 10*3/uL (ref 0.0–0.5)
Eosinophils Relative: 7 %
HCT: 43.9 % (ref 39.0–52.0)
Hemoglobin: 14.7 g/dL (ref 13.0–17.0)
Immature Granulocytes: 0 %
Lymphocytes Relative: 25 %
Lymphs Abs: 1.9 10*3/uL (ref 0.7–4.0)
MCH: 30.4 pg (ref 26.0–34.0)
MCHC: 33.5 g/dL (ref 30.0–36.0)
MCV: 90.7 fL (ref 80.0–100.0)
Monocytes Absolute: 0.6 10*3/uL (ref 0.1–1.0)
Monocytes Relative: 8 %
Neutro Abs: 4.3 10*3/uL (ref 1.7–7.7)
Neutrophils Relative %: 59 %
Platelets: 172 10*3/uL (ref 150–400)
RBC: 4.84 MIL/uL (ref 4.22–5.81)
RDW: 12.5 % (ref 11.5–15.5)
WBC: 7.3 10*3/uL (ref 4.0–10.5)
nRBC: 0 % (ref 0.0–0.2)

## 2020-04-08 LAB — COMPREHENSIVE METABOLIC PANEL
ALT: 29 U/L (ref 0–44)
AST: 20 U/L (ref 15–41)
Albumin: 3.4 g/dL — ABNORMAL LOW (ref 3.5–5.0)
Alkaline Phosphatase: 59 U/L (ref 38–126)
Anion gap: 9 (ref 5–15)
BUN: 17 mg/dL (ref 8–23)
CO2: 22 mmol/L (ref 22–32)
Calcium: 8.6 mg/dL — ABNORMAL LOW (ref 8.9–10.3)
Chloride: 105 mmol/L (ref 98–111)
Creatinine, Ser: 1.09 mg/dL (ref 0.61–1.24)
GFR, Estimated: >60 mL/min (ref >60)
Glucose, Bld: 103 mg/dL — ABNORMAL HIGH (ref 70–99)
Potassium: 3.8 mmol/L (ref 3.5–5.1)
Sodium: 136 mmol/L (ref 135–145)
Total Bilirubin: 0.5 mg/dL (ref 0.3–1.2)
Total Protein: 6.4 g/dL — ABNORMAL LOW (ref 6.5–8.1)

## 2020-04-08 LAB — CULTURE, BLOOD (ROUTINE X 2)
Culture: NO GROWTH
Special Requests: ADEQUATE

## 2020-04-08 MED ORDER — MELATONIN 3 MG PO TABS
3.0000 mg | ORAL_TABLET | Freq: Every day | ORAL | 3 refills | Status: DC
Start: 2020-04-08 — End: 2020-06-28

## 2020-04-08 MED ORDER — GABAPENTIN 100 MG PO CAPS: 100.0000 mg | ORAL_CAPSULE | Freq: Two times a day (BID) | ORAL | 3 refills | Status: DC

## 2020-04-08 NOTE — Discharge Summary (Signed)
Physician Discharge Summary  Ruben [name] KGM:[id number] DOB: [birth year] DOA: 04/03/2020  PCP: Ileana Ladd, MD  Admit date: 04/03/2020 Discharge date: 04/08/2020  Time spent: 27 minutes  Recommendations for Outpatient Follow-up:  1. Needs to follow-up with 12-step program and also other alcohol withdrawal support groups as per psychiatry 2. Cleared by psychiatry from suicidality perspective-new medication gabapentin 100 twice daily for EtOH withdrawal 3. Please get Chem-12 CBC 1 week  Discharge Diagnoses:  Principal Problem:   Suicidal ideation Active Problems:   Alcohol intoxication (HCC)   Essential hypertension   Alcohol withdrawal (HCC)   Alcoholic ketoacidosis   Alcohol use disorder, severe, dependence (HCC)   Hyperlipidemia   Discharge Condition: Guarded  Diet recommendation: Hypertension  Filed Weights   04/03/20 1532 04/03/20 2100  Weight: 84.8 kg 94.3 kg    History of present illness:  69 year old white male, prior Nissen fundoplication 2020, severe alcohol abuse disorder with history of withdrawal seizures DTs, HTN, HLD Presented delirious diaphoretic and was actively withdrawing-states he has been severely depressed and was going to shoot himself --[facility] PD came and his guns were taken away from him psychiatry saw the patient initially felt that he needed to be IVCD and placed in a psychiatric facility They subsequently saw him and cleared him   Hospital Course:  EtOH withdrawal Was on CIWA protocol with Librium taper and Ativan Psychiatry saw the patient felt he was stabilized to discharge home as he was no longer suicidal and they mentioned starting him on gabapentin 100 twice daily for withdrawal support Hypokalemia earlier in admission Resolved and replaced Noninfectious lactic acidosis metabolic acidosis Secondary to alcohol use Antibiotics were discontinued Leukocytosis resolved Suicidality Dr. Lucianne Muss discussed with me personally-patient  was probably delirious at the time of initial eval He no longer seems suicidal and contracts for safety and was recommended to have 3 or 4 separate social supports and a support system on discharge HLD HTN all stable resume home meds  Consultations:  Psychiatry  Discharge Exam: Vitals:   04/07/20 2121 04/08/20 0609  BP: (!) 121/96 117/70  Pulse: 100 76  Resp: 20 20  Temp: 98.1 F (36.7 C) (!) 97.5 F (36.4 C)  SpO2: 99% 95%    General: Awake alert coherent no distress EOMI NCAT no focal deficit Cardiovascular: S1-S2 no murmur no rub no gallop Respiratory: Clear no added sound Abdomen soft nontender no rebound no guarding Neurologically intact moving all 4 limbs  Discharge Instructions   Discharge Instructions    Diet - low sodium heart healthy   Complete by: As directed    Discharge instructions   Complete by: As directed    Take medications for sleep--melatonin--try not to take habbit forming medications such as bengryl or any tylenol containing products Get labs in 1-2 weeks We will call in some melatonins for sleep for you, and you should continue on neurontin for alcohol cessation Good lukc and happy new year   Increase activity slowly   Complete by: As directed      Allergies as of 04/08/2020   No Known Allergies     Medication List    STOP taking these medications   busPIRone 5 MG tablet Commonly known as: BUSPAR   clonazePAM 0.5 MG tablet Commonly known as: KLONOPIN   doxepin 10 MG capsule Commonly known as: SINEQUAN   escitalopram 20 MG tablet Commonly known as: LEXAPRO     TAKE these medications   amLODipine 5 MG tablet Commonly known as: NORVASC Take  1 tablet (5 mg total) by mouth daily.   atorvastatin 20 MG tablet Commonly known as: LIPITOR Take 1 tablet (20 mg total) by mouth daily.   buPROPion 150 MG 24 hr tablet Commonly known as: WELLBUTRIN XL Take 150 mg by mouth daily.   feeding supplement Liqd Take 237 mLs by mouth 2 (two)  times daily between meals.   folic acid 1 MG tablet Commonly known as: FOLVITE Take 1 mg by mouth daily.   gabapentin 100 MG capsule Commonly known as: Neurontin Take 1 capsule (100 mg total) by mouth 2 (two) times daily.   Magnesium 400 MG Tabs Take 1,600 mg by mouth daily.   magnesium citrate Soln Take 296 mLs (1 Bottle total) by mouth daily as needed for severe constipation.   melatonin 3 MG Tabs tablet Take 1 tablet (3 mg total) by mouth at bedtime.   metoprolol tartrate 25 MG tablet Commonly known as: LOPRESSOR Take 1 tablet (25 mg total) by mouth 2 (two) times daily.   multivitamin with minerals Tabs tablet Take 1 tablet by mouth daily.   polyethylene glycol 17 g packet Commonly known as: MIRALAX / GLYCOLAX Take 17 g by mouth 2 (two) times daily as needed for mild constipation.   senna-docusate 8.6-50 MG tablet Commonly known as: Senokot-S Take 1 tablet by mouth 2 (two) times daily as needed for moderate constipation.      No Known Allergies    The results of significant diagnostics from this hospitalization (including imaging, microbiology, ancillary and laboratory) are listed below for reference.    Significant Diagnostic Studies: DG Chest Portable 1 View  Result Date: 04/03/2020 CLINICAL DATA:  69 year old male with alcohol withdrawal. EXAM: PORTABLE CHEST 1 VIEW COMPARISON:  Chest radiograph dated 10/01/2019. FINDINGS: Minimal left lung base atelectasis. No focal consolidation, pleural effusion, pneumothorax. Top-normal cardiac size. No acute osseous pathology. IMPRESSION: No active disease. Electronically Signed   By: Elgie Collard M.D.   On: 04/03/2020 16:00    Microbiology: Recent Results (from the past 240 hour(s))  Blood Culture (routine x 2)     Status: None   Collection Time: 04/03/20  3:35 PM   Specimen: BLOOD  Result Value Ref Range Status   Specimen Description   Final    BLOOD RIGHT WRIST Performed at [facility],  [address] [place], [place] [address]    Special Requests   Final    BOTTLES DRAWN AEROBIC AND ANAEROBIC Blood Culture adequate volume Performed at [facility], [address] [place], [place] [address]    Culture   Final    NO GROWTH 5 DAYS Performed at [facility], [address], [place], [place] [address]    Report Status 04/08/2020 FINAL  Final  Urine culture     Status: None   Collection Time: 04/03/20  3:35 PM   Specimen: Urine, Catheterized  Result Value Ref Range Status   Specimen Description   Final    URINE, CATHETERIZED Performed at [facility], [address], [place], [place] [address]    Special Requests   Final    NONE Performed at [facility], [address] [place], [place] [address]    Culture   Final    NO GROWTH Performed at [facility], [address] [place], [place] [address]    Report Status 04/05/2020 FINAL  Final  Resp Panel by RT-PCR (Flu A&B, Covid) Nasopharyngeal Swab  Status: None   Collection Time: 04/03/20  3:58 PM   Specimen: Nasopharyngeal Swab; Nasopharyngeal(NP) swabs in vial transport medium  Result Value Ref Range Status   SARS Coronavirus 2 by RT PCR NEGATIVE NEGATIVE Final    Comment: (NOTE) SARS-CoV-2 target nucleic acids are NOT DETECTED.  The SARS-CoV-2 RNA is generally detectable in upper respiratory specimens during the acute phase of infection. The lowest concentration of SARS-CoV-2 viral copies this assay can detect is 138 copies/mL. A negative result does not preclude SARS-Cov-2 infection and should not be used as the sole basis for treatment or other patient management decisions. A negative result may occur with  improper specimen collection/handling, submission of specimen other than nasopharyngeal swab, presence of viral mutation(s) within the areas targeted by this assay, and inadequate number of viral copies(<138 copies/mL). A  negative result must be combined with clinical observations, patient history, and epidemiological information. The expected result is Negative.  Fact Sheet for Patients:  EntrepreneurPulse.com.au  Fact Sheet for Healthcare Providers:  IncredibleEmployment.be  This test is no t yet approved or cleared by the [place] FDA and  has been authorized for detection and/or diagnosis of SARS-CoV-2 by FDA under an Emergency Use Authorization (EUA). This EUA will remain  in effect (meaning this test can be used) for the duration of the COVID-19 declaration under Section 564(b)(1) of the Act, 21 U.S.C.section 360bbb-3(b)(1), unless the authorization is terminated  or revoked sooner.       Influenza A by PCR NEGATIVE NEGATIVE Final   Influenza B by PCR NEGATIVE NEGATIVE Final    Comment: (NOTE) The Xpert Xpress SARS-CoV-2/FLU/RSV plus assay is intended as an aid in the diagnosis of influenza from Nasopharyngeal swab specimens and should not be used as a sole basis for treatment. Nasal washings and aspirates are unacceptable for Xpert Xpress SARS-CoV-2/FLU/RSV testing.  Fact Sheet for Patients: EntrepreneurPulse.com.au  Fact Sheet for Healthcare Providers: IncredibleEmployment.be  This test is not yet approved or cleared by the [place] FDA and has been authorized for detection and/or diagnosis of SARS-CoV-2 by FDA under an Emergency Use Authorization (EUA). This EUA will remain in effect (meaning this test can be used) for the duration of the COVID-19 declaration under Section 564(b)(1) of the Act, 21 U.S.C. section 360bbb-3(b)(1), unless the authorization is terminated or revoked.  Performed at [facility], [place] [address] [place], [place] [address]   MRSA PCR Screening     Status: None   Collection Time: 04/03/20  9:24 PM   Specimen: Nasopharyngeal  Result Value Ref Range Status    MRSA by PCR NEGATIVE NEGATIVE Final    Comment:        The GeneXpert MRSA Assay (FDA approved for NASAL specimens only), is one component of a comprehensive MRSA colonization surveillance program. It is not intended to diagnose MRSA infection nor to guide or monitor treatment for MRSA infections. Performed at [facility], [place] [address] [place], [place] [address]   Blood Culture (routine x 2)     Status: None (Preliminary result)   Collection Time: 04/03/20 10:56 PM   Specimen: BLOOD  Result Value Ref Range Status   Specimen Description   Final    BLOOD LEFT ANTECUBITAL Performed at [facility] [address] [place], [place] [address]    Special Requests   Final    BOTTLES DRAWN AEROBIC ONLY Blood Culture adequate volume Performed at [facility] [address] [place], [place] [address]  Culture   Final    NO GROWTH 4 DAYS Performed at [facility], [address] [place], [place] [address]    Report Status PENDING  Incomplete     Labs: Basic Metabolic Panel: Recent Labs  Lab 04/03/20 1535 04/03/20 1558 04/04/20 0209 04/05/20 0145 04/07/20 0231 04/08/20 0459  NA  --  139 138 137 137 136  K  --  4.6 4.2 3.4* 3.6 3.8  CL  --  102 105 102 104 105  CO2  --  12* 24 27 22 22   GLUCOSE  --  125* 134* 117* 116* 103*  BUN  --  28* 24* 18 15 17   CREATININE  --  1.29* 1.09 1.06 0.99 1.09  CALCIUM  --  8.4* 8.0* 8.1* 8.6* 8.6*  MG 1.7  --  1.6* 1.8 1.8  --    Liver Function Tests: Recent Labs  Lab 04/03/20 1558 04/04/20 0209 04/05/20 0145 04/08/20 0459  AST 39 22 24 20   ALT 36 25 23 29   ALKPHOS 76 54 49 59  BILITOT 1.2 1.5* 1.2 0.5  PROT 7.6 5.7* 5.6* 6.4*  ALBUMIN 4.1 3.1* 3.0* 3.4*   Recent Labs  Lab 04/03/20 1535  LIPASE 18   No results for input(s): AMMONIA in the last 168 hours. CBC: Recent Labs  Lab 04/03/20 1558 04/04/20 0209 04/05/20 0145 04/08/20 0459  WBC 18.3*  10.0 5.8 7.3  NEUTROABS 14.4*  --  3.2 4.3  HGB 15.6 12.9* 12.3* 14.7  HCT 48.0 39.0 37.2* 43.9  MCV 92.5 90.3 90.5 90.7  PLT 316 213 164 172   Cardiac Enzymes: No results for input(s): CKTOTAL, CKMB, CKMBINDEX, TROPONINI in the last 168 hours. BNP: BNP (last 3 results) No results for input(s): BNP in the last 8760 hours.  ProBNP (last 3 results) No results for input(s): PROBNP in the last 8760 hours.  CBG: Recent Labs  Lab 04/03/20 1635  GLUCAP 114*       Signed:  04/06/20 MD   [facility] 04/08/2020, 1:15 PM

## 2020-04-08 NOTE — Consult Note (Addendum)
[facility] Face-to-Face Psychiatry Consult   Reason for Consult: Alcohol use disorder and suicidal ideation Referring Physician: Hospitalist  Patient Identification: Ruben [name] MRN:  [id number] Principal Diagnosis: Suicidal ideation Diagnosis:  Principal Problem:   Suicidal ideation Active Problems:   Alcohol intoxication ([place])   Essential hypertension   Alcohol withdrawal ([place])   Alcoholic ketoacidosis   Alcohol use disorder, severe, dependence ([place])   Hyperlipidemia   Total Time spent with patient: 45 minutes  Subjective:   Ruben [name] is a 69 y.o. male patient admitted with alcohol intoxication and severe alcohol use disorder.  Patient also has a history of DVT, hypertension, hyperlipidemia.  Patient also reported suicidal ideation on admission  HPI: Patient is a 69 year old male who was admitted for alcohol intoxication, severe alcohol use disorder along with suicidal ideation.  Patient reports that he was intoxicated when he made the statement that he was going to shoot himself.  He has that the police have taken the guns, he has no access to them.  Patient states that he has had an alcohol use disorder for many years now, has had residential treatment in the past which includes [facility], [facility] program in [place] and has also done AA.  Patient states that currently he is in treatment at [facility], attends the program there Monday, Wednesday and Friday  Patient states that he does not want to do residential treatment as he feels it has not been helpful, would like to stay with the [facility],  be restarted on gabapentin as it is helped him in the past with his cravings.  Patient reports that his family lives in [place] but that he has friends here, lives in [place], is retired, has a Neurosurgeon and a Magazine features editor.  Patient adds that he wants to stay sober, plans to start attending AA again and  will continue his treatment at the [facility] for his addiction  Patient  reports that he has no thoughts of hurting himself, hurting others, any psychotic symptoms.  He does report that sometimes he has anxiety and drinks at times to help relieve it.  Discussed coping strategies with patient, the need to discuss this with the regular center so he has continued treatment for his anxiety.  Patient denies any other substance use, denies any symptoms of mania.  Past Psychiatric History: Unchanged from previous consult note  Risk to Self:   Risk to Others:   Prior Inpatient Therapy:   Prior Outpatient Therapy:    Past Medical History:  Past Medical History:  Diagnosis Date  . Alcoholism ([place])   . Aortic atherosclerosis ([place]) 12/26/2016   Noted on CT  . Arthritis   . BPH (benign prostatic hyperplasia)   . Chronic constipation    takes magnesium  . Duodenal ulcer   . Dyspnea 12/17/2017  . ED (erectile dysfunction)   . Foley catheter in place    history of no longer in place  . GERD (gastroesophageal reflux disease)   . Heart palpitations    Occ  . Hiatal hernia   . History of adenomatous polyp of colon    tubular adenoma's 2005  . History of Helicobacter pylori infection    2008  . Hypercholesteremia   . Hypertension   . MR (mitral regurgitation)    Mild, noted on ECHO  . Pre-diabetes   . Pulmonary nodule 12/26/2016   noted on CT, 5 x 3 mm nodular opacity right upper lobe  . Rosacea   .  Schatzki's ring    last dilated 02/ 2016  . Urinary retention    history of  . Varicocele 02/25/2014   Small to moderate left varicocele    Past Surgical History:  Procedure Laterality Date  . BALLOON DILATION N/A 06/08/2014   Procedure: BALLOON DILATION;  Surgeon: Garlan Fair, MD;  Location: [facility];  Service: Endoscopy;  Laterality: N/A;  . COLONOSCOPY WITH PROPOFOL N/A 06/08/2014   Procedure: COLONOSCOPY WITH PROPOFOL;  Surgeon: Garlan Fair, MD;  Location: [facility];  Service: Endoscopy;  Laterality: N/A;  . ESOPHAGOGASTRODUODENOSCOPY N/A  06/08/2014   Procedure: ESOPHAGOGASTRODUODENOSCOPY (EGD);  Surgeon: Garlan Fair, MD;  Location: [facility];  Service: Endoscopy;  Laterality: N/A;  . ESOPHAGOGASTRODUODENOSCOPY (EGD) WITH ESOPHAGEAL DILATION N/A 12/31/2012   Procedure: ESOPHAGOGASTRODUODENOSCOPY (EGD) WITH ESOPHAGEAL DILATION;  Surgeon: Garlan Fair, MD;  Location: [facility];  Service: Endoscopy;  Laterality: N/A;  . GREEN LIGHT LASER TURP (TRANSURETHRAL RESECTION OF PROSTATE N/A 12/28/2015   Procedure: GREEN LIGHT LASER TURP (TRANSURETHRAL RESECTION OF PROSTATE;  Surgeon: Festus Aloe, MD;  Location: [facility];  Service: Urology;  Laterality: N/A;   Family History:  Family History  Problem Relation Age of Onset  . Heart failure Mother   . Stroke Father    Family Psychiatric  History: None reported by patient Social History:  Social History   Substance and Sexual Activity  Alcohol Use Yes     Social History   Substance and Sexual Activity  Drug Use No    Social History   Socioeconomic History  . Marital status: Divorced    Spouse name: Not on file  . Number of children: Not on file  . Years of education: Not on file  . Highest education level: Not on file  Occupational History  . Not on file  Tobacco Use  . Smoking status: Never Smoker  . Smokeless tobacco: Never Used  Vaping Use  . Vaping Use: Never used  Substance and Sexual Activity  . Alcohol use: Yes  . Drug use: No  . Sexual activity: Not on file  Other Topics Concern  . Not on file  Social History Narrative  . Not on file   Social Determinants of Health   Financial Resource Strain: Not on file  Food Insecurity: Not on file  Transportation Needs: Not on file  Physical Activity: Not on file  Stress: Not on file  Social Connections: Not on file   Additional Social History:    Allergies:  No Known Allergies  Labs:  Results for orders placed or performed during the hospital encounter of 04/03/20 (from  the past 48 hour(s))  Basic metabolic panel     Status: Abnormal   Collection Time: 04/07/20  2:31 AM  Result Value Ref Range   Sodium 137 135 - 145 mmol/L   Potassium 3.6 3.5 - 5.1 mmol/L   Chloride 104 98 - 111 mmol/L   CO2 22 22 - 32 mmol/L   Glucose, Bld 116 (H) 70 - 99 mg/dL    Comment: Glucose reference range applies only to samples taken after fasting for at least 8 hours.   BUN 15 8 - 23 mg/dL   Creatinine, Ser 0.99 0.61 - 1.24 mg/dL   Calcium 8.6 (L) 8.9 - 10.3 mg/dL   GFR, Estimated >60 >60 mL/min    Comment: (NOTE) Calculated using the CKD-EPI Creatinine Equation (2021)    Anion gap 11 5 - 15    Comment: Performed at [facility]  [place] [address] [place], [place] [address]  Magnesium     Status: None   Collection Time: 04/07/20  2:31 AM  Result Value Ref Range   Magnesium 1.8 1.7 - 2.4 mg/dL    Comment: Performed at [facility], [place] [address], [place], [place] [address]  CBC with Differential/Platelet     Status: None   Collection Time: 04/08/20  4:59 AM  Result Value Ref Range   WBC 7.3 4.0 - 10.5 K/uL   RBC 4.84 4.22 - 5.81 MIL/uL   Hemoglobin 14.7 13.0 - 17.0 g/dL   HCT 43.9 39.0 - 52.0 %   MCV 90.7 80.0 - 100.0 fL   MCH 30.4 26.0 - 34.0 pg   MCHC 33.5 30.0 - 36.0 g/dL   RDW 12.5 11.5 - 15.5 %   Platelets 172 150 - 400 K/uL   nRBC 0.0 0.0 - 0.2 %   Neutrophils Relative % 59 %   Neutro Abs 4.3 1.7 - 7.7 K/uL   Lymphocytes Relative 25 %   Lymphs Abs 1.9 0.7 - 4.0 K/uL   Monocytes Relative 8 %   Monocytes Absolute 0.6 0.1 - 1.0 K/uL   Eosinophils Relative 7 %   Eosinophils Absolute 0.5 0.0 - 0.5 K/uL   Basophils Relative 1 %   Basophils Absolute 0.1 0.0 - 0.1 K/uL   Immature Granulocytes 0 %   Abs Immature Granulocytes 0.01 0.00 - 0.07 K/uL    Comment: Performed at [facility], [place] [address], [place], [place] [address]  Comprehensive metabolic panel     Status: Abnormal   Collection Time:  04/08/20  4:59 AM  Result Value Ref Range   Sodium 136 135 - 145 mmol/L   Potassium 3.8 3.5 - 5.1 mmol/L   Chloride 105 98 - 111 mmol/L   CO2 22 22 - 32 mmol/L   Glucose, Bld 103 (H) 70 - 99 mg/dL    Comment: Glucose reference range applies only to samples taken after fasting for at least 8 hours.   BUN 17 8 - 23 mg/dL   Creatinine, Ser 1.09 0.61 - 1.24 mg/dL   Calcium 8.6 (L) 8.9 - 10.3 mg/dL   Total Protein 6.4 (L) 6.5 - 8.1 g/dL   Albumin 3.4 (L) 3.5 - 5.0 g/dL   AST 20 15 - 41 U/L   ALT 29 0 - 44 U/L   Alkaline Phosphatase 59 38 - 126 U/L   Total Bilirubin 0.5 0.3 - 1.2 mg/dL   GFR, Estimated >60 >60 mL/min    Comment: (NOTE) Calculated using the CKD-EPI Creatinine Equation (2021)    Anion gap 9 5 - 15    Comment: Performed at [facility], [place] [address], [place], [place] [address]    Current Facility-Administered Medications  Medication Dose Route Frequency Provider Last Rate Last Admin  . acetaminophen (TYLENOL) tablet 650 mg  650 mg Oral Q6H PRN Lenore Cordia, MD       Or  . acetaminophen (TYLENOL) suppository 650 mg  650 mg Rectal Q6H PRN Zada Finders R, MD      . amLODipine (NORVASC) tablet 5 mg  5 mg Oral Daily Lenore Cordia, MD   5 mg at 04/08/20 [id number]  . atorvastatin (LIPITOR) tablet 20 mg  20 mg Oral Daily Lenore Cordia, MD   20 mg at 04/08/20 [id number]  . bisacodyl (DULCOLAX) EC tablet 5 mg  5 mg Oral Daily PRN Zada Finders  R, MD      . Chlorhexidine Gluconate Cloth 2 % PADS 6 each  6 each Topical Daily Lenore Cordia, MD   6 each at 04/08/20 [phone]  . diphenhydrAMINE (BENADRYL) capsule 25 mg  25 mg Oral QHS PRN Lang Snow, FNP   25 mg at 04/07/20 2201  . enoxaparin (LOVENOX) injection 40 mg  40 mg Subcutaneous Q24H Lenore Cordia, MD   40 mg at 04/07/20 2122  . folic acid (FOLVITE) tablet 1 mg  1 mg Oral Daily Lenore Cordia, MD   1 mg at 04/08/20 [id number]  . metoprolol tartrate (LOPRESSOR) tablet 25 mg  25 mg Oral BID Aline August, MD   25  mg at 04/08/20 [id number]  . multivitamin with minerals tablet 1 tablet  1 tablet Oral Daily Lenore Cordia, MD   1 tablet at 04/08/20 0920  . ondansetron (ZOFRAN) tablet 4 mg  4 mg Oral Q6H PRN Lenore Cordia, MD       Or  . ondansetron (ZOFRAN) injection 4 mg  4 mg Intravenous Q6H PRN Lenore Cordia, MD      . senna-docusate (Senokot-S) tablet 1 tablet  1 tablet Oral QHS PRN Zada Finders R, MD      . sodium chloride flush (NS) 0.9 % injection 3 mL  3 mL Intravenous Q12H Lenore Cordia, MD   3 mL at 04/08/20 0921  . thiamine tablet 100 mg  100 mg Oral Daily Zada Finders R, MD   100 mg at 04/08/20 [id number]   Or  . thiamine (B-1) injection 100 mg  100 mg Intravenous Daily Lenore Cordia, MD   100 mg at 04/04/20 1014    Musculoskeletal: Strength & Muscle Tone: within normal limits Gait & Station: normal Patient leans: N/A  Psychiatric Specialty Exam: Physical Exam  Review of Systems  Constitutional: Positive for unexpected weight change. Negative for activity change and fatigue.  HENT: Negative for congestion, sneezing, sore throat and trouble swallowing.   Eyes: Negative for photophobia, redness and visual disturbance.  Respiratory: Negative.  Negative for cough, choking, chest tightness, shortness of breath, wheezing and stridor.   Musculoskeletal: Negative for gait problem and neck stiffness.  Neurological: Negative for dizziness, tremors, seizures, syncope and weakness.  Psychiatric/Behavioral: Positive for sleep disturbance. Negative for agitation, behavioral problems, confusion, decreased concentration, dysphoric mood, hallucinations, self-injury and suicidal ideas. The patient is not nervous/anxious and is not hyperactive.     Blood pressure 117/70, pulse 76, temperature (!) 97.5 F (36.4 C), temperature source Oral, resp. rate 20, height 5\' 11"  (1.803 m), weight 94.3 kg, SpO2 95 %.Body mass index is 29 kg/m.  General Appearance: Casual  Eye Contact:  Good  Speech:  Clear and  Coherent and Normal Rate  Volume:  Normal  Mood:  Euthymic  Affect:  Congruent and Full Range  Thought Process:  Coherent, Goal Directed and Descriptions of Associations: Intact  Orientation:  Full (Time, Place, and Person)  Thought Content:  Logical and Rumination  Suicidal Thoughts:  No  Homicidal Thoughts:  No  Memory:  Immediate;   Fair Recent;   Fair Remote;   Fair  Judgement:  Intact  Insight:  Shallow  Psychomotor Activity:  Normal  Concentration:  Concentration: Fair and Attention Span: Fair  Recall:  [employer] of Knowledge:  Fair  Language:  Fair  Akathisia:  No  Handed:  Right  AIMS (if indicated):     Assets:  Communication Skills Desire for  Improvement Financial Resources/Insurance Housing Leisure Time Social Support Transportation  ADL's:  Intact  Cognition:  WNL  Sleep:        Treatment Plan Summary: Plan To start patient on Neurontin 100 mg twice daily to help with patient's craving, patient reports that helped him in the past  Disposition: No evidence of imminent risk to self or others at present.   Patient does not meet criteria for psychiatric inpatient admission. Supportive therapy provided about ongoing stressors. Refer to IOP. Discussed crisis plan, support from social network, calling 911, coming to the Emergency Department, and calling Suicide Hotline.  Nelly Rout, MD 04/08/2020 1:14 PM

## 2020-04-08 NOTE — TOC Transition Note (Signed)
Transition of Care [facility]) - CM/SW Discharge Note   Patient Details  Name: SKILER OLDEN MRN: [id number] Date of Birth: August 07, 1950  [facility] CM/SW Contact:  Amada Jupiter, LCSW Phone Number: 04/08/2020, 1:30 PM   Clinical Narrative:    Pt has been medically and psychiatrically cleared for home discharge.  Pt aware and pleased with this.  He confirms plan to resume IOP at [facility].  No further TOC needs.   Final next level of care: Home/Self Care Barriers to Discharge: Barriers Resolved   Patient Goals and CMS Choice Patient states their goals for this hospitalization and ongoing recovery are:: i feel fine now CMS Medicare.gov Compare Post Acute Care list provided to:: Patient    Discharge Placement                       Discharge Plan and Services   Discharge Planning Services: CM Consult            DME Arranged: N/A DME Agency: NA                  Social Determinants of Health (SDOH) Interventions     Readmission Risk Interventions No flowsheet data found.

## 2020-04-09 LAB — CULTURE, BLOOD (ROUTINE X 2)
Culture: NO GROWTH
Special Requests: ADEQUATE

## 2020-04-14 DIAGNOSIS — G8929 Other chronic pain: Secondary | ICD-10-CM | POA: Diagnosis not present

## 2020-04-14 DIAGNOSIS — E7800 Pure hypercholesterolemia, unspecified: Secondary | ICD-10-CM | POA: Diagnosis not present

## 2020-04-14 DIAGNOSIS — E782 Mixed hyperlipidemia: Secondary | ICD-10-CM | POA: Diagnosis not present

## 2020-04-14 DIAGNOSIS — I10 Essential (primary) hypertension: Secondary | ICD-10-CM | POA: Diagnosis not present

## 2020-04-14 DIAGNOSIS — G4700 Insomnia, unspecified: Secondary | ICD-10-CM | POA: Diagnosis not present

## 2020-04-14 DIAGNOSIS — K219 Gastro-esophageal reflux disease without esophagitis: Secondary | ICD-10-CM | POA: Diagnosis not present

## 2020-04-14 DIAGNOSIS — N400 Enlarged prostate without lower urinary tract symptoms: Secondary | ICD-10-CM | POA: Diagnosis not present

## 2020-04-22 DIAGNOSIS — R6889 Other general symptoms and signs: Secondary | ICD-10-CM | POA: Diagnosis not present

## 2020-04-22 DIAGNOSIS — I10 Essential (primary) hypertension: Secondary | ICD-10-CM | POA: Diagnosis not present

## 2020-04-22 DIAGNOSIS — R079 Chest pain, unspecified: Secondary | ICD-10-CM | POA: Diagnosis not present

## 2020-04-22 DIAGNOSIS — J9811 Atelectasis: Secondary | ICD-10-CM | POA: Diagnosis not present

## 2020-05-21 DIAGNOSIS — Z23 Encounter for immunization: Secondary | ICD-10-CM | POA: Diagnosis not present

## 2020-05-30 DIAGNOSIS — Z743 Need for continuous supervision: Secondary | ICD-10-CM | POA: Diagnosis not present

## 2020-05-30 DIAGNOSIS — R0902 Hypoxemia: Secondary | ICD-10-CM | POA: Diagnosis not present

## 2020-05-30 DIAGNOSIS — F32A Depression, unspecified: Secondary | ICD-10-CM | POA: Diagnosis not present

## 2020-05-30 DIAGNOSIS — R6889 Other general symptoms and signs: Secondary | ICD-10-CM | POA: Diagnosis not present

## 2020-05-31 DIAGNOSIS — F10139 Alcohol abuse with withdrawal, unspecified: Secondary | ICD-10-CM | POA: Diagnosis not present

## 2020-06-01 DIAGNOSIS — F331 Major depressive disorder, recurrent, moderate: Secondary | ICD-10-CM | POA: Insufficient documentation

## 2020-06-01 DIAGNOSIS — F10139 Alcohol abuse with withdrawal, unspecified: Secondary | ICD-10-CM | POA: Diagnosis not present

## 2020-06-01 DIAGNOSIS — Y908 Blood alcohol level of 240 mg/100 ml or more: Secondary | ICD-10-CM | POA: Diagnosis not present

## 2020-06-04 DIAGNOSIS — R7303 Prediabetes: Secondary | ICD-10-CM | POA: Insufficient documentation

## 2020-06-04 DIAGNOSIS — L570 Actinic keratosis: Secondary | ICD-10-CM | POA: Insufficient documentation

## 2020-06-07 DIAGNOSIS — F331 Major depressive disorder, recurrent, moderate: Secondary | ICD-10-CM | POA: Diagnosis not present

## 2020-06-07 DIAGNOSIS — G4700 Insomnia, unspecified: Secondary | ICD-10-CM | POA: Diagnosis not present

## 2020-06-07 DIAGNOSIS — I10 Essential (primary) hypertension: Secondary | ICD-10-CM | POA: Diagnosis not present

## 2020-06-07 DIAGNOSIS — G8929 Other chronic pain: Secondary | ICD-10-CM | POA: Diagnosis not present

## 2020-06-07 DIAGNOSIS — E782 Mixed hyperlipidemia: Secondary | ICD-10-CM | POA: Diagnosis not present

## 2020-06-07 DIAGNOSIS — N400 Enlarged prostate without lower urinary tract symptoms: Secondary | ICD-10-CM | POA: Diagnosis not present

## 2020-06-07 DIAGNOSIS — E7800 Pure hypercholesterolemia, unspecified: Secondary | ICD-10-CM | POA: Diagnosis not present

## 2020-06-07 DIAGNOSIS — K219 Gastro-esophageal reflux disease without esophagitis: Secondary | ICD-10-CM | POA: Diagnosis not present

## 2020-06-08 DIAGNOSIS — M542 Cervicalgia: Secondary | ICD-10-CM | POA: Diagnosis not present

## 2020-06-15 DIAGNOSIS — H40053 Ocular hypertension, bilateral: Secondary | ICD-10-CM | POA: Diagnosis not present

## 2020-06-15 DIAGNOSIS — D3131 Benign neoplasm of right choroid: Secondary | ICD-10-CM | POA: Diagnosis not present

## 2020-06-22 ENCOUNTER — Inpatient Hospital Stay (HOSPITAL_COMMUNITY)
Admission: EM | Admit: 2020-06-22 | Discharge: 2020-06-28 | DRG: 897 | Disposition: A | Discharge status: Home / Self Care | Payer: Medicare Other | Attending: Internal Medicine | Admitting: Internal Medicine

## 2020-06-22 ENCOUNTER — Emergency Department (HOSPITAL_COMMUNITY): Payer: Medicare Other

## 2020-06-22 ENCOUNTER — Encounter (HOSPITAL_COMMUNITY): Payer: Self-pay | Admitting: Emergency Medicine

## 2020-06-22 DIAGNOSIS — Z20822 Contact with and (suspected) exposure to covid-19: Secondary | ICD-10-CM | POA: Diagnosis present

## 2020-06-22 DIAGNOSIS — F10930 Alcohol use, unspecified with withdrawal, uncomplicated: Secondary | ICD-10-CM

## 2020-06-22 DIAGNOSIS — Z8249 Family history of ischemic heart disease and other diseases of the circulatory system: Secondary | ICD-10-CM

## 2020-06-22 DIAGNOSIS — R000 Tachycardia, unspecified: Secondary | ICD-10-CM | POA: Diagnosis present

## 2020-06-22 DIAGNOSIS — R5381 Other malaise: Secondary | ICD-10-CM | POA: Diagnosis present

## 2020-06-22 DIAGNOSIS — G4700 Insomnia, unspecified: Secondary | ICD-10-CM | POA: Diagnosis present

## 2020-06-22 DIAGNOSIS — K449 Diaphragmatic hernia without obstruction or gangrene: Secondary | ICD-10-CM | POA: Diagnosis not present

## 2020-06-22 DIAGNOSIS — E871 Hypo-osmolality and hyponatremia: Secondary | ICD-10-CM | POA: Diagnosis present

## 2020-06-22 DIAGNOSIS — K760 Fatty (change of) liver, not elsewhere classified: Secondary | ICD-10-CM | POA: Diagnosis present

## 2020-06-22 DIAGNOSIS — N179 Acute kidney failure, unspecified: Secondary | ICD-10-CM | POA: Diagnosis present

## 2020-06-22 DIAGNOSIS — E785 Hyperlipidemia, unspecified: Secondary | ICD-10-CM | POA: Diagnosis present

## 2020-06-22 DIAGNOSIS — E872 Acidosis, unspecified: Secondary | ICD-10-CM | POA: Diagnosis present

## 2020-06-22 DIAGNOSIS — D6959 Other secondary thrombocytopenia: Secondary | ICD-10-CM | POA: Diagnosis present

## 2020-06-22 DIAGNOSIS — H5500 Unspecified nystagmus: Secondary | ICD-10-CM | POA: Diagnosis present

## 2020-06-22 DIAGNOSIS — Z79899 Other long term (current) drug therapy: Secondary | ICD-10-CM

## 2020-06-22 DIAGNOSIS — R0602 Shortness of breath: Secondary | ICD-10-CM | POA: Diagnosis not present

## 2020-06-22 DIAGNOSIS — I493 Ventricular premature depolarization: Secondary | ICD-10-CM | POA: Diagnosis present

## 2020-06-22 DIAGNOSIS — Z823 Family history of stroke: Secondary | ICD-10-CM

## 2020-06-22 DIAGNOSIS — N400 Enlarged prostate without lower urinary tract symptoms: Secondary | ICD-10-CM | POA: Diagnosis present

## 2020-06-22 DIAGNOSIS — D696 Thrombocytopenia, unspecified: Secondary | ICD-10-CM | POA: Diagnosis not present

## 2020-06-22 DIAGNOSIS — I700 Atherosclerosis of aorta: Secondary | ICD-10-CM | POA: Diagnosis present

## 2020-06-22 DIAGNOSIS — R911 Solitary pulmonary nodule: Secondary | ICD-10-CM | POA: Diagnosis not present

## 2020-06-22 DIAGNOSIS — F10230 Alcohol dependence with withdrawal, uncomplicated: Secondary | ICD-10-CM | POA: Diagnosis present

## 2020-06-22 DIAGNOSIS — I10 Essential (primary) hypertension: Secondary | ICD-10-CM | POA: Diagnosis present

## 2020-06-22 DIAGNOSIS — K5909 Other constipation: Secondary | ICD-10-CM | POA: Diagnosis present

## 2020-06-22 DIAGNOSIS — J9811 Atelectasis: Secondary | ICD-10-CM | POA: Diagnosis not present

## 2020-06-22 DIAGNOSIS — Z8711 Personal history of peptic ulcer disease: Secondary | ICD-10-CM | POA: Diagnosis not present

## 2020-06-22 DIAGNOSIS — R002 Palpitations: Secondary | ICD-10-CM | POA: Diagnosis present

## 2020-06-22 DIAGNOSIS — F10239 Alcohol dependence with withdrawal, unspecified: Secondary | ICD-10-CM | POA: Diagnosis present

## 2020-06-22 DIAGNOSIS — F10939 Alcohol use, unspecified with withdrawal, unspecified: Secondary | ICD-10-CM | POA: Diagnosis present

## 2020-06-22 DIAGNOSIS — N289 Disorder of kidney and ureter, unspecified: Secondary | ICD-10-CM | POA: Diagnosis not present

## 2020-06-22 DIAGNOSIS — K7010 Alcoholic hepatitis without ascites: Secondary | ICD-10-CM | POA: Diagnosis present

## 2020-06-22 LAB — CBC
HCT: 51.3 % (ref 39.0–52.0)
Hemoglobin: 16.8 g/dL (ref 13.0–17.0)
MCH: 29.2 pg (ref 26.0–34.0)
MCHC: 32.7 g/dL (ref 30.0–36.0)
MCV: 89.2 fL (ref 80.0–100.0)
Platelets: 178 10*3/uL (ref 150–400)
RBC: 5.75 MIL/uL (ref 4.22–5.81)
RDW: 14.3 % (ref 11.5–15.5)
WBC: 9.8 10*3/uL (ref 4.0–10.5)
nRBC: 0 % (ref 0.0–0.2)

## 2020-06-22 LAB — COMPREHENSIVE METABOLIC PANEL
ALT: 65 U/L — ABNORMAL HIGH (ref 0–44)
ALT: 47 U/L — ABNORMAL HIGH (ref 0–44)
AST: 64 U/L — ABNORMAL HIGH (ref 15–41)
AST: 64 U/L — ABNORMAL HIGH (ref 15–41)
Albumin: 4.8 g/dL (ref 3.5–5.0)
Albumin: 3.7 g/dL (ref 3.5–5.0)
Alkaline Phosphatase: 86 U/L (ref 38–126)
Alkaline Phosphatase: 73 U/L (ref 38–126)
Anion gap: 27 — ABNORMAL HIGH (ref 5–15)
Anion gap: 24 — ABNORMAL HIGH (ref 5–15)
BUN: 22 mg/dL (ref 8–23)
BUN: 18 mg/dL (ref 8–23)
CO2: 12 mmol/L — ABNORMAL LOW (ref 22–32)
CO2: 10 mmol/L — ABNORMAL LOW (ref 22–32)
Calcium: 8.9 mg/dL (ref 8.9–10.3)
Calcium: 7.9 mg/dL — ABNORMAL LOW (ref 8.9–10.3)
Chloride: 99 mmol/L (ref 98–111)
Chloride: 101 mmol/L (ref 98–111)
Creatinine, Ser: 1.33 mg/dL — ABNORMAL HIGH (ref 0.61–1.24)
Creatinine, Ser: 1.44 mg/dL — ABNORMAL HIGH (ref 0.61–1.24)
GFR, Estimated: 58 mL/min — ABNORMAL LOW (ref >60)
GFR, Estimated: 53 mL/min — ABNORMAL LOW (ref >60)
Glucose, Bld: 76 mg/dL (ref 70–99)
Glucose, Bld: 81 mg/dL (ref 70–99)
Potassium: 4.2 mmol/L (ref 3.5–5.1)
Potassium: 4.5 mmol/L (ref 3.5–5.1)
Sodium: 138 mmol/L (ref 135–145)
Sodium: 135 mmol/L (ref 135–145)
Total Bilirubin: 1.3 mg/dL — ABNORMAL HIGH (ref 0.3–1.2)
Total Bilirubin: 1.1 mg/dL (ref 0.3–1.2)
Total Protein: 8.4 g/dL — ABNORMAL HIGH (ref 6.5–8.1)
Total Protein: 6.6 g/dL (ref 6.5–8.1)

## 2020-06-22 LAB — RESP PANEL BY RT-PCR (FLU A&B, COVID) ARPGX2
Influenza A by PCR: NEGATIVE
Influenza B by PCR: NEGATIVE
SARS Coronavirus 2 by RT PCR: NEGATIVE

## 2020-06-22 LAB — D-DIMER, QUANTITATIVE: D-Dimer, Quant: 0.99 ug/mL-FEU — ABNORMAL HIGH (ref 0.00–0.50)

## 2020-06-22 LAB — T4, FREE: Free T4: 0.82 ng/dL (ref 0.61–1.12)

## 2020-06-22 LAB — LIPASE, BLOOD: Lipase: 26 U/L (ref 11–51)

## 2020-06-22 LAB — TSH: TSH: 1.295 u[IU]/mL (ref 0.350–4.500)

## 2020-06-22 MED ORDER — LORAZEPAM 2 MG/ML IJ SOLN: 0.0000 mg | Freq: Two times a day (BID) | INTRAMUSCULAR | Status: DC

## 2020-06-22 MED ORDER — LORAZEPAM 2 MG/ML IJ SOLN
0.0000 mg | Freq: Four times a day (QID) | INTRAMUSCULAR | Status: DC
  Administered 2020-06-22: 2 mg via INTRAVENOUS
  Filled 2020-06-22: qty 1

## 2020-06-22 MED ORDER — LORAZEPAM 1 MG PO TABS: 0.0000 mg | ORAL_TABLET | Freq: Four times a day (QID) | ORAL | Status: DC

## 2020-06-22 MED ORDER — LACTATED RINGERS IV BOLUS
1500.0000 mL | Freq: Once | INTRAVENOUS | Status: AC
  Administered 2020-06-22: 1500 mL via INTRAVENOUS

## 2020-06-22 MED ORDER — LORAZEPAM 2 MG/ML IJ SOLN
2.0000 mg | Freq: Once | INTRAMUSCULAR | Status: AC
  Administered 2020-06-22: 2 mg via INTRAVENOUS
  Filled 2020-06-22: qty 1

## 2020-06-22 MED ORDER — IOHEXOL 350 MG/ML SOLN
100.0000 mL | Freq: Once | INTRAVENOUS | Status: AC | PRN
  Administered 2020-06-22: 100 mL via INTRAVENOUS

## 2020-06-22 MED ORDER — LORAZEPAM 1 MG PO TABS: 0.0000 mg | ORAL_TABLET | Freq: Two times a day (BID) | ORAL | Status: DC

## 2020-06-22 MED ORDER — SODIUM CHLORIDE 0.9 % IV BOLUS
1000.0000 mL | Freq: Once | INTRAVENOUS | Status: AC
  Administered 2020-06-22: 1000 mL via INTRAVENOUS

## 2020-06-22 MED ORDER — THIAMINE HCL 100 MG PO TABS: 100.0000 mg | ORAL_TABLET | Freq: Every day | ORAL | Status: DC

## 2020-06-22 MED ORDER — ONDANSETRON HCL 4 MG/2ML IJ SOLN
4.0000 mg | Freq: Once | INTRAMUSCULAR | Status: AC
  Administered 2020-06-22: 4 mg via INTRAVENOUS
  Filled 2020-06-22: qty 2

## 2020-06-22 MED ORDER — THIAMINE HCL 100 MG/ML IJ SOLN
100.0000 mg | Freq: Every day | INTRAMUSCULAR | Status: DC
  Administered 2020-06-22: 100 mg via INTRAVENOUS
  Filled 2020-06-22: qty 2

## 2020-06-22 NOTE — ED Triage Notes (Signed)
Per EMS-coming from home-patient called for exertional SOB-patient uses ETOH daily-has not been drinking or eating-taking more gabapentin and doxepin than prescribed-states PCP diagnosed him with an irregular heart beat

## 2020-06-22 NOTE — ED Provider Notes (Signed)
I received pt in signout from Dr. Billy Fischer. Briefly, he has hx of alcohol use daily and last drink this morning, p/w elevated HR and DOE w/ palpitations. At time of signout, labs and CIWA protocol ordered.   Lab work notable for bicarb of 12, glucose 76, BUN 22, creatinine 1.33, AST 64, ALT 65, total bilirubin 1.3, anion gap 27 which I suspect is related to dehydration rather than DKA.  D-dimer mildly elevated, CTA negative for PE or other acute process in the chest.  TSH normal.  Covid and influenza negative.  Patient has received 2 L of fluids and has remained on CIWA protocol with IV Ativan but has continued to be tachycardic with heart rate consistently in 130s.  It is possible that this is being driven by alcohol withdrawal.  Recommended admission for further treatment.  Discussed with [facility] hospitalist, Dr. Myna Hidalgo.   Janiyah Beery, Wenda [place], MD 06/22/20 2146

## 2020-06-22 NOTE — ED Notes (Signed)
Please call Quenton Fetter, cousin with update, [phone].

## 2020-06-22 NOTE — ED Provider Notes (Signed)
[facility] DEPT Provider Note   CSN: [id number] Arrival date & time: 06/22/20  1355     History Chief Complaint  Patient presents with  . elevated heart rate    Ruben [name] is a 70 y.o. male.  HPI      70 year old male with history of alcoholism, hypertension, hyperlipidemia, duodenal ulcer, presents with concern for palpitations.  Reports that he chronically has palpitations and heart racing, and reports that no one wants to investigate why, they just assume is related to his alcohol use.  Reports he has always had elevated heart rate and is here to find out why.  Reports he is here because his heart is racing and racing.    He has been trying to cut back on his alcohol use, reporting that he is drinking less today.  Has noticed some mild shaking.  Reports nausea and dry heaves beginning today and headaches.  Denies hallucinations, numbness, itching or burning.  Denies syncope.  On review of systems reports he has had some shortness of breath on exertion, and today he felt short of breath just walking to the shower.  He denies taking medications other than those that are prescribed to him.  Denies drug use.  He had told the triage nurse that he had been inappropriately taking his gabapentin and doxepin, but reports to me he does not take gabapentin.  He does not know how much doxepin he has had, and denies overdose or suicidal ideation.  Denies fevers, cough, abdominal pain, diarrhea, black or bloody stools.  He becomes tearful regarding what is happening in the world and in [place]  Past Medical History:  Diagnosis Date  . Alcoholism ([place])   . Alcoholism ([place])   . Aortic atherosclerosis ([place]) 12/26/2016   Noted on CT  . Arthritis   . BPH (benign prostatic hyperplasia)   . Chronic constipation    takes magnesium  . Duodenal ulcer   . Dyspnea 12/17/2017  . ED (erectile dysfunction)   . Foley catheter in place    history of no longer in  place  . GERD (gastroesophageal reflux disease)   . Heart palpitations    Occ  . Hiatal hernia   . History of adenomatous polyp of colon    tubular adenoma's 2005  . History of Helicobacter pylori infection    2008  . Hypercholesteremia   . Hypertension   . MR (mitral regurgitation)    Mild, noted on ECHO  . Pre-diabetes   . Pulmonary nodule 12/26/2016   noted on CT, 5 x 3 mm nodular opacity right upper lobe  . Rosacea   . Schatzki's ring    last dilated 02/ 2016  . Urinary retention    history of  . Varicocele 02/25/2014   Small to moderate left varicocele    Patient Active Problem List   Diagnosis Date Noted  . Weight loss 04/08/2020  . Unsteady gait 04/08/2020  . Insomnia 04/08/2020  . Lump of skin of left upper extremity 04/08/2020  . Irregular heart beat 04/08/2020  . Pure hypercholesterolemia 04/08/2020  . Alcohol use disorder, severe, dependence ([place]) 04/03/2020  . Hyperlipidemia 04/03/2020  . Suicidal ideation 04/03/2020  . Alcoholic ketoacidosis 36/46/8032  . Thrombocytopenia ([place]) 12/22/2019  . ARF (acute renal failure) ([place]) 10/01/2019  . Alcohol withdrawal ([place]) 10/01/2019  . Normochromic normocytic anemia 10/01/2019  . GERD (gastroesophageal reflux disease) 06/22/2019  . BPH (benign prostatic hyperplasia) 06/22/2019  . Essential hypertension 06/22/2019  .  AKI (acute kidney injury) ([place])   . Alcohol intoxication ([place]) 06/21/2019  . Shoulder joint pain 03/31/2019  . S/P Nissen fundoplication (without gastrostomy tube) procedure 04/26/2018  . Rosacea 06/24/2011    Past Surgical History:  Procedure Laterality Date  . BALLOON DILATION N/A 06/08/2014   Procedure: BALLOON DILATION;  Surgeon: Garlan Fair, MD;  Location: [facility];  Service: Endoscopy;  Laterality: N/A;  . COLONOSCOPY WITH PROPOFOL N/A 06/08/2014   Procedure: COLONOSCOPY WITH PROPOFOL;  Surgeon: Garlan Fair, MD;  Location: [facility];  Service: Endoscopy;  Laterality: N/A;  .  ESOPHAGOGASTRODUODENOSCOPY N/A 06/08/2014   Procedure: ESOPHAGOGASTRODUODENOSCOPY (EGD);  Surgeon: Garlan Fair, MD;  Location: [facility];  Service: Endoscopy;  Laterality: N/A;  . ESOPHAGOGASTRODUODENOSCOPY (EGD) WITH ESOPHAGEAL DILATION N/A 12/31/2012   Procedure: ESOPHAGOGASTRODUODENOSCOPY (EGD) WITH ESOPHAGEAL DILATION;  Surgeon: Garlan Fair, MD;  Location: [facility];  Service: Endoscopy;  Laterality: N/A;  . GREEN LIGHT LASER TURP (TRANSURETHRAL RESECTION OF PROSTATE N/A 12/28/2015   Procedure: GREEN LIGHT LASER TURP (TRANSURETHRAL RESECTION OF PROSTATE;  Surgeon: Festus Aloe, MD;  Location: [facility];  Service: Urology;  Laterality: N/A;       Family History  Problem Relation Age of Onset  . Heart failure Mother   . Stroke Father     Social History   Tobacco Use  . Smoking status: Never Smoker  . Smokeless tobacco: Never Used  Vaping Use  . Vaping Use: Never used  Substance Use Topics  . Alcohol use: Yes  . Drug use: No    Home Medications Prior to Admission medications   Medication Sig Start Date End Date Taking? Authorizing Provider  amLODipine (NORVASC) 5 MG tablet Take 1 tablet (5 mg total) by mouth daily. 12/30/19 12/29/20  Mercy Riding, MD  atorvastatin (LIPITOR) 20 MG tablet Take 1 tablet (20 mg total) by mouth daily. 10/06/19 12/22/19  [place] Friendly, MD  buPROPion (WELLBUTRIN XL) 150 MG 24 hr tablet Take 150 mg by mouth daily. 02/19/20   [provider]  feeding supplement, ENSURE ENLIVE, (ENSURE ENLIVE) LIQD Take 237 mLs by mouth 2 (two) times daily between meals. 12/30/19   Mercy Riding, MD  folic acid (FOLVITE) 1 MG tablet Take 1 mg by mouth daily.    [provider]  gabapentin (NEURONTIN) 100 MG capsule Take 1 capsule (100 mg total) by mouth 2 (two) times daily. 04/08/20 08/06/20  Nita Sells, MD  Magnesium 400 MG TABS Take 1,600 mg by mouth daily.    [provider]  magnesium citrate  SOLN Take 296 mLs (1 Bottle total) by mouth daily as needed for severe constipation. 12/30/19   Mercy Riding, MD  melatonin 3 MG TABS tablet Take 1 tablet (3 mg total) by mouth at bedtime. 04/08/20   Nita Sells, MD  metoprolol tartrate (LOPRESSOR) 25 MG tablet Take 1 tablet (25 mg total) by mouth 2 (two) times daily. 10/06/19 12/22/19  [place] Friendly, MD  Multiple Vitamin (MULTIVITAMIN WITH MINERALS) TABS tablet Take 1 tablet by mouth daily. 12/30/19   Mercy Riding, MD  polyethylene glycol (MIRALAX / GLYCOLAX) 17 g packet Take 17 g by mouth 2 (two) times daily as needed for mild constipation. 12/30/19   Mercy Riding, MD  senna-docusate (SENOKOT-S) 8.6-50 MG tablet Take 1 tablet by mouth 2 (two) times daily as needed for moderate constipation. 12/30/19   Mercy Riding, MD    Allergies    Patient has no  known allergies.  Review of Systems   Review of Systems  Constitutional: Negative for fever.  HENT: Negative for sore throat.   Eyes: Negative for visual disturbance.  Respiratory: Positive for shortness of breath. Negative for cough.   Cardiovascular: Negative for chest pain and leg swelling.  Gastrointestinal: Positive for nausea. Negative for abdominal pain, diarrhea and vomiting (dry heaves).  Genitourinary: Negative for difficulty urinating.  Musculoskeletal: Negative for back pain and neck stiffness.  Skin: Negative for rash.  Neurological: Negative for syncope, light-headedness and headaches.  Psychiatric/Behavioral: Positive for sleep disturbance. Negative for hallucinations and suicidal ideas. The patient is nervous/anxious.     Physical Exam Updated Vital Signs BP (!) 147/89 (BP Location: Right Arm)   Pulse (!) 130   Temp 97.9 F (36.6 C) (Oral)   Resp 16   SpO2 91%   Physical Exam Vitals and nursing note reviewed.  Constitutional:      General: He is not in acute distress.    Appearance: He is well-developed. He is not diaphoretic.  HENT:     Head:  Normocephalic and atraumatic.  Eyes:     Conjunctiva/sclera: Conjunctivae normal.  Cardiovascular:     Rate and Rhythm: Regular rhythm. Tachycardia present.     Heart sounds: Normal heart sounds. No murmur heard. No friction rub. No gallop.   Pulmonary:     Effort: Pulmonary effort is normal. No respiratory distress.     Breath sounds: Normal breath sounds. No wheezing or rales.  Abdominal:     General: There is no distension.     Palpations: Abdomen is soft.     Tenderness: There is no abdominal tenderness. There is no guarding.  Musculoskeletal:     Cervical back: Normal range of motion.  Skin:    General: Skin is warm and dry.  Neurological:     Mental Status: He is alert and oriented to person, place, and time.     Comments: Tremors with arms extended  Psychiatric:     Comments: Anxious appearance     ED Results / Procedures / Treatments   Labs (all labs ordered are listed, but only abnormal results are displayed) Labs Reviewed  D-DIMER, QUANTITATIVE - Abnormal; Notable for the following components:      Result Value   D-Dimer, Quant 0.99 (*)    All other components within normal limits  RESP PANEL BY RT-PCR (FLU A&B, COVID) [id number]  CBC  TSH  LIPASE, BLOOD  COMPREHENSIVE METABOLIC PANEL  URINALYSIS, ROUTINE W REFLEX MICROSCOPIC  T4, FREE  RAPID URINE DRUG SCREEN, HOSP PERFORMED    EKG EKG Interpretation  Date/Time:  Tuesday June 22 2020 14:07:17 EDT Ventricular Rate:  139 PR Interval:    QRS Duration: 78 QT Interval:  299 QTC Calculation: 455 R Axis:   21 Text Interpretation: Suspect sinus tachycardia Multiple ventricular premature complexes Abnormal R-wave progression, late transition 12 Lead; [place] No significant change since last tracing Confirmed by Gareth Morgan [phone]) on 06/22/2020 5:04:54 PM   Radiology DG Chest Portable 1 View  Result Date: 06/22/2020 CLINICAL DATA:  Shortness of breath. EXAM: PORTABLE CHEST 1 VIEW COMPARISON:   04/03/2020 FINDINGS: 1610 hours. Low lung volumes. The lungs are clear without focal pneumonia, edema, pneumothorax or pleural effusion. The cardiopericardial silhouette is within normal limits for size. The visualized bony structures of the thorax show no acute abnormality. Telemetry leads overlie the chest. IMPRESSION: No active disease. Electronically Signed   By: Misty Stanley M.D.   On: 06/22/2020  16:25    Procedures Procedures   Medications Ordered in ED Medications  LORazepam (ATIVAN) injection 0-4 mg (0 mg Intravenous Not Given 06/22/20 1552)    Or  LORazepam (ATIVAN) tablet 0-4 mg ( Oral See Alternative 06/22/20 1552)  LORazepam (ATIVAN) injection 0-4 mg (has no administration in time range)    Or  LORazepam (ATIVAN) tablet 0-4 mg (has no administration in time range)  thiamine tablet 100 mg ( Oral See Alternative 06/22/20 1544)    Or  thiamine (B-1) injection 100 mg (100 mg Intravenous Given 06/22/20 1544)  sodium chloride 0.9 % bolus 1,000 mL (1,000 mLs Intravenous New Bag/Given 06/22/20 1545)  LORazepam (ATIVAN) injection 2 mg (2 mg Intravenous Given 06/22/20 1547)  ondansetron (ZOFRAN) injection 4 mg (4 mg Intravenous Given 06/22/20 1543)    ED Course  I have reviewed the triage vital signs and the nursing notes.  Pertinent labs & imaging results that were available during my care of the patient were reviewed by me and considered in my medical decision making (see chart for details).    MDM Rules/Calculators/A&P                          69 year old male with history of alcoholism, hypertension, hyperlipidemia, duodenal ulcer, presents with concern for palpitations.  Presents with sinus tachycardia, ddx includes etoh withdrawal, overdose, PE, hyperthyroidism, dehydration.  Denies overdose, does not have QRS widening or wide R wave and at this time have lower suspicion for TCA overdose by hx he has provided and other etiologies are possible.  Has tremors, n/v, tachycardia-given  ativan. Labs pending at time of transfer of care.    Final Clinical Impression(s) / ED Diagnoses Final diagnoses:  Tachycardia  Alcohol withdrawal syndrome without complication [facility])    Rx / DC Orders ED Discharge Orders    None       Gareth Morgan, MD 06/22/20 1710

## 2020-06-22 NOTE — ED Triage Notes (Signed)
Patient states his last drink was at 1100 am

## 2020-06-23 ENCOUNTER — Encounter (HOSPITAL_COMMUNITY): Payer: Self-pay | Admitting: Family Medicine

## 2020-06-23 DIAGNOSIS — N289 Disorder of kidney and ureter, unspecified: Secondary | ICD-10-CM

## 2020-06-23 LAB — BLOOD GAS, ARTERIAL
Acid-base deficit: 16.9 mmol/L — ABNORMAL HIGH (ref 0.0–2.0)
Bicarbonate: 9.7 mmol/L — ABNORMAL LOW (ref 20.0–28.0)
FIO2: 21
O2 Saturation: 95 %
Patient temperature: 97.9
pCO2 arterial: 24.5 mmHg — ABNORMAL LOW (ref 32.0–48.0)
pH, Arterial: 7.218 — ABNORMAL LOW (ref 7.350–7.450)
pO2, Arterial: 83.8 mmHg (ref 83.0–108.0)

## 2020-06-23 LAB — CBC
HCT: 43.3 % (ref 39.0–52.0)
Hemoglobin: 14.2 g/dL (ref 13.0–17.0)
MCH: 29.5 pg (ref 26.0–34.0)
MCHC: 32.8 g/dL (ref 30.0–36.0)
MCV: 90 fL (ref 80.0–100.0)
Platelets: 116 10*3/uL — ABNORMAL LOW (ref 150–400)
RBC: 4.81 MIL/uL (ref 4.22–5.81)
RDW: 14.6 % (ref 11.5–15.5)
WBC: 10.9 10*3/uL — ABNORMAL HIGH (ref 4.0–10.5)
nRBC: 0 % (ref 0.0–0.2)

## 2020-06-23 LAB — COMPREHENSIVE METABOLIC PANEL
ALT: 45 U/L — ABNORMAL HIGH (ref 0–44)
AST: 37 U/L (ref 15–41)
Albumin: 3.6 g/dL (ref 3.5–5.0)
Alkaline Phosphatase: 66 U/L (ref 38–126)
Anion gap: 21 — ABNORMAL HIGH (ref 5–15)
BUN: 19 mg/dL (ref 8–23)
CO2: 11 mmol/L — ABNORMAL LOW (ref 22–32)
Calcium: 7.7 mg/dL — ABNORMAL LOW (ref 8.9–10.3)
Chloride: 102 mmol/L (ref 98–111)
Creatinine, Ser: 1.15 mg/dL (ref 0.61–1.24)
GFR, Estimated: >60 mL/min (ref >60)
Glucose, Bld: 124 mg/dL — ABNORMAL HIGH (ref 70–99)
Potassium: 4.6 mmol/L (ref 3.5–5.1)
Sodium: 134 mmol/L — ABNORMAL LOW (ref 135–145)
Total Bilirubin: 1.4 mg/dL — ABNORMAL HIGH (ref 0.3–1.2)
Total Protein: 6.4 g/dL — ABNORMAL LOW (ref 6.5–8.1)

## 2020-06-23 LAB — URINALYSIS, ROUTINE W REFLEX MICROSCOPIC
Bilirubin Urine: NEGATIVE
Glucose, UA: NEGATIVE mg/dL
Ketones, ur: 80 mg/dL — AB
Leukocytes,Ua: NEGATIVE
Nitrite: NEGATIVE
Protein, ur: 30 mg/dL — AB
Specific Gravity, Urine: 1.034 — ABNORMAL HIGH (ref 1.005–1.030)
pH: 5 (ref 5.0–8.0)

## 2020-06-23 LAB — ACETAMINOPHEN LEVEL: Acetaminophen (Tylenol), Serum: <10 ug/mL — ABNORMAL LOW (ref 10–30)

## 2020-06-23 LAB — RAPID URINE DRUG SCREEN, HOSP PERFORMED
Amphetamines: NOT DETECTED
Barbiturates: NOT DETECTED
Benzodiazepines: NOT DETECTED
Cocaine: NOT DETECTED
Opiates: NOT DETECTED
Tetrahydrocannabinol: NOT DETECTED

## 2020-06-23 LAB — ETHYLENE GLYCOL: Ethylene Glycol Lvl: <5 mg/dL

## 2020-06-23 LAB — VOLATILES,BLD-ACETONE,ETHANOL,ISOPROP,METHANOL
Acetone, blood: 0.021 g/dL — ABNORMAL HIGH (ref 0.000–0.010)
Ethanol, blood: 0.061 g/dL — ABNORMAL HIGH (ref 0.000–0.010)
Isopropanol, blood: <0.01 g/dL (ref 0.000–0.010)
Methanol, blood: <0.01 g/dL (ref 0.000–0.010)

## 2020-06-23 LAB — LACTIC ACID, PLASMA
Lactic Acid, Venous: 4.1 mmol/L (ref 0.5–1.9)
Lactic Acid, Venous: 2 mmol/L (ref 0.5–1.9)

## 2020-06-23 LAB — PHOSPHORUS: Phosphorus: 3 mg/dL (ref 2.5–4.6)

## 2020-06-23 LAB — SALICYLATE LEVEL: Salicylate Lvl: <7 mg/dL — ABNORMAL LOW (ref 7.0–30.0)

## 2020-06-23 LAB — MAGNESIUM: Magnesium: 1.6 mg/dL — ABNORMAL LOW (ref 1.7–2.4)

## 2020-06-23 MED ORDER — THIAMINE HCL 100 MG PO TABS
100.0000 mg | ORAL_TABLET | Freq: Every day | ORAL | Status: DC
  Administered 2020-06-23 – 2020-06-28 (×6): 100 mg via ORAL
  Filled 2020-06-23 (×6): qty 1

## 2020-06-23 MED ORDER — ENOXAPARIN SODIUM 40 MG/0.4ML ~~LOC~~ SOLN
40.0000 mg | SUBCUTANEOUS | Status: DC
  Administered 2020-06-23: 40 mg via SUBCUTANEOUS
  Filled 2020-06-23: qty 0.4

## 2020-06-23 MED ORDER — ATORVASTATIN CALCIUM 20 MG PO TABS
20.0000 mg | ORAL_TABLET | Freq: Every day | ORAL | Status: DC
Start: 2020-06-23 — End: 2020-06-28
  Administered 2020-06-23 – 2020-06-28 (×6): 20 mg via ORAL
  Filled 2020-06-23 (×6): qty 1

## 2020-06-23 MED ORDER — SODIUM BICARBONATE 650 MG PO TABS
1300.0000 mg | ORAL_TABLET | Freq: Two times a day (BID) | ORAL | Status: DC
  Administered 2020-06-23 (×2): 1300 mg via ORAL
  Filled 2020-06-23 (×2): qty 2

## 2020-06-23 MED ORDER — MAGNESIUM SULFATE 2 GM/50ML IV SOLN
2.0000 g | Freq: Once | INTRAVENOUS | Status: AC
  Administered 2020-06-23: 2 g via INTRAVENOUS
  Filled 2020-06-23: qty 50

## 2020-06-23 MED ORDER — LORAZEPAM 2 MG/ML IJ SOLN
0.0000 mg | Freq: Two times a day (BID) | INTRAMUSCULAR | Status: DC
  Administered 2020-06-24: 1 mg via INTRAVENOUS
  Administered 2020-06-25: 4 mg via INTRAVENOUS
  Administered 2020-06-26: 2 mg via INTRAVENOUS
  Filled 2020-06-23: qty 1
  Filled 2020-06-23: qty 2
  Filled 2020-06-23: qty 1
  Filled 2020-06-23: qty 2

## 2020-06-23 MED ORDER — POLYETHYLENE GLYCOL 3350 17 G PO PACK: 17.0000 g | PACK | Freq: Every day | ORAL | Status: DC | PRN

## 2020-06-23 MED ORDER — ACETAMINOPHEN 325 MG PO TABS: 650.0000 mg | ORAL_TABLET | Freq: Four times a day (QID) | ORAL | Status: DC | PRN

## 2020-06-23 MED ORDER — LORAZEPAM 2 MG/ML IJ SOLN
0.0000 mg | Freq: Four times a day (QID) | INTRAMUSCULAR | Status: DC
  Administered 2020-06-23 – 2020-06-24 (×7): 1 mg via INTRAVENOUS
  Filled 2020-06-23 (×7): qty 1

## 2020-06-23 MED ORDER — METOPROLOL TARTRATE 50 MG PO TABS
50.0000 mg | ORAL_TABLET | Freq: Two times a day (BID) | ORAL | Status: DC
  Administered 2020-06-23 – 2020-06-28 (×12): 50 mg via ORAL
  Filled 2020-06-23 (×11): qty 1
  Filled 2020-06-23: qty 2

## 2020-06-23 MED ORDER — ADULT MULTIVITAMIN W/MINERALS CH
1.0000 | ORAL_TABLET | Freq: Every day | ORAL | Status: DC
  Administered 2020-06-23 – 2020-06-28 (×6): 1 via ORAL
  Filled 2020-06-23 (×6): qty 1

## 2020-06-23 MED ORDER — LORAZEPAM 2 MG/ML IJ SOLN
1.0000 mg | INTRAMUSCULAR | Status: AC | PRN
  Administered 2020-06-25: 2 mg via INTRAVENOUS
  Administered 2020-06-25: 3 mg via INTRAVENOUS
  Administered 2020-06-25: 2 mg via INTRAVENOUS
  Administered 2020-06-25: 1 mg via INTRAVENOUS
  Filled 2020-06-23 (×2): qty 1
  Filled 2020-06-23: qty 2
  Filled 2020-06-23: qty 1

## 2020-06-23 MED ORDER — FOLIC ACID 1 MG PO TABS
1.0000 mg | ORAL_TABLET | Freq: Every day | ORAL | Status: DC
  Administered 2020-06-23 – 2020-06-28 (×6): 1 mg via ORAL
  Filled 2020-06-23 (×6): qty 1

## 2020-06-23 MED ORDER — THIAMINE HCL 100 MG/ML IJ SOLN
100.0000 mg | Freq: Every day | INTRAMUSCULAR | Status: DC
  Filled 2020-06-23 (×2): qty 2

## 2020-06-23 MED ORDER — LORAZEPAM 1 MG PO TABS
1.0000 mg | ORAL_TABLET | ORAL | Status: AC | PRN
  Administered 2020-06-23 – 2020-06-24 (×3): 1 mg via ORAL
  Filled 2020-06-23 (×3): qty 1

## 2020-06-23 MED ORDER — SODIUM CHLORIDE 0.9 % IV SOLN: INTRAVENOUS | Status: AC

## 2020-06-23 MED ORDER — SODIUM CHLORIDE 0.9 % IV BOLUS
1000.0000 mL | Freq: Once | INTRAVENOUS | Status: AC
  Administered 2020-06-23: 1000 mL via INTRAVENOUS

## 2020-06-23 MED ORDER — ACETAMINOPHEN 650 MG RE SUPP: 650.0000 mg | Freq: Four times a day (QID) | RECTAL | Status: DC | PRN

## 2020-06-23 MED ORDER — AMLODIPINE BESYLATE 5 MG PO TABS
5.0000 mg | ORAL_TABLET | Freq: Every day | ORAL | Status: DC
  Administered 2020-06-23 – 2020-06-28 (×6): 5 mg via ORAL
  Filled 2020-06-23 (×6): qty 1

## 2020-06-23 MED ORDER — ONDANSETRON HCL 4 MG/2ML IJ SOLN: 4.0000 mg | Freq: Four times a day (QID) | INTRAMUSCULAR | Status: DC | PRN

## 2020-06-23 MED ORDER — ONDANSETRON HCL 4 MG PO TABS: 4.0000 mg | ORAL_TABLET | Freq: Four times a day (QID) | ORAL | Status: DC | PRN

## 2020-06-23 NOTE — Progress Notes (Addendum)
[facility] PROGRESS NOTE   JAKAIDEN FILL RWE:[id number] DOB: [birth year] DOA: 06/22/2020  PCP: [name], MD  Brief History/Interval Summary: 70 y.o. male with medical history significant for alcoholism, peptic ulcer disease, hypertension, insomnia, and pulmonary nodule, presented to the emergency department for evaluation of palpitations and general malaise.  Patient reports occasional palpitations, had been told in the past that it was related to his excessive alcohol use, and now presents with recurrent palpitations while trying to cut back on alcohol intake.  Patient states that he typically has 18 shots of vodka daily but developed recurrent palpitations over the last couple days and so he has been trying to cut back.   Consultants: None  Procedures: None  Antibiotics: Anti-infectives (From admission, onward)   None      Subjective/Interval History: Patient somewhat drowsy this morning but denies any abdominal pain or chest pain.  No shortness of breath.  He is a bit tremulous.    Assessment/Plan:  Alcohol withdrawal Patient seems to be stable currently.  Continue CIWA protocol and treatment with Ativan.  Social worker to be consulted to provide patient with outpatient resources for alcohol rehab.  Continue thiamine multivitamin and folic acid.  Palpitations Likely due to alcohol use.  Has a longstanding history of same.  Currently noted to be on metoprolol.  Continue to monitor for now.  Thyroid function tests were normal.  EKG shows sinus tachycardia.  Metabolic acidosis Most likely due to alcoholic ketoacidosis.  Continue IV hydration.  Urine drug screen was unremarkable.  Blood toxicology screen was also unremarkable except for elevation in alcohol level.  Mild acute kidney injury Creatinine was 1.44.  Improved to 1.15 today.  Continue IV fluids.  Monitor urine output.  Essential hypertension Continue amlodipine and metoprolol.  Hyponatremia and  hypomagnesemia Replete magnesium.  Continue IV fluids.  Recheck labs tomorrow.  Transaminitis Mild elevation in liver function tests due to alcoholism.  Stable.  Fatty liver noted on CT scan.  Had negative viral hepatitis testing recently.  Incidental findings on CT angiogram chest 5 mm noncalcified right upper lobe lung nodule, stable since December 27, 2016 and likely benign.  Moderate-sized hiatal hernia.  Outpatient follow-up  DVT Prophylaxis: Lovenox.  Platelet counts noted to be slightly low today.  Recheck tomorrow. Code Status: Full code Family Communication: Discussed with the patient Disposition Plan: Hopefully home when improved  Status is: Inpatient  Remains inpatient appropriate because:IV treatments appropriate due to intensity of illness or inability to take PO and Inpatient level of care appropriate due to severity of illness   Dispo: The patient is from: Home              Anticipated d/c is to: Home              Patient currently is not medically stable to d/c.   Difficult to place patient No      Medications:  Scheduled: . amLODipine  5 mg Oral Daily  . atorvastatin  20 mg Oral Daily  . enoxaparin (LOVENOX) injection  40 mg Subcutaneous Q24H  . folic acid  1 mg Oral Daily  . LORazepam  0-4 mg Intravenous Q6H   Followed by  . [START ON 06/25/2020] LORazepam  0-4 mg Intravenous Q12H  . metoprolol tartrate  50 mg Oral BID  . multivitamin with minerals  1 tablet Oral Daily  . thiamine  100 mg Oral Daily   Or  . thiamine  100 mg Intravenous Daily  Continuous:  HFW:[id number] **OR** acetaminophen, LORazepam **OR** LORazepam, ondansetron **OR** ondansetron (ZOFRAN) IV, polyethylene glycol   Objective:  Vital Signs  Vitals:   06/23/20 0630 06/23/20 0700 06/23/20 0713 06/23/20 0815  BP: 134/79 (!) 143/77 (!) 143/77 (!) 106/94  Pulse: 84 77 77 84  Resp: 18 18  (!) 22  Temp:    98.4 F (36.9 C)  TempSrc:    Oral  SpO2: 95% 95%  98%     Intake/Output Summary (Last 24 hours) at 06/23/2020 1119 Last data filed at 06/23/2020 0816 Gross per 24 hour  Intake 1236 ml  Output -  Net 1236 ml   There were no vitals filed for this visit.  General appearance: Somnolent though easily arousable Resp: Clear to auscultation bilaterally.  Normal effort Cardio: S1-S2 is normal regular.  No S3-S4.  No rubs murmurs or bruit GI: Abdomen is soft.  Nontender nondistended.  Bowel sounds are present normal.  No masses organomegaly Extremities: No edema.  Full range of motion of lower extremities. Neurologic: Alert and oriented x3.  No focal neurological deficits.  Mildly tremulous   Lab Results:  Data Reviewed: I have personally reviewed following labs and imaging studies  CBC: Recent Labs  Lab 06/22/20 1538 06/23/20 0530  WBC 9.8 10.9*  HGB 16.8 14.2  HCT 51.3 43.3  MCV 89.2 90.0  PLT 178 116*    Basic Metabolic Panel: Recent Labs  Lab 06/22/20 1538 06/22/20 2200 06/23/20 0530  NA 138 135 134*  K 4.2 4.5 4.6  CL 99 101 102  CO2 12* 10* 11*  GLUCOSE 76 81 124*  BUN 22 18 19   CREATININE 1.33* 1.44* 1.15  CALCIUM 8.9 7.9* 7.7*  MG  --   --  1.6*  PHOS  --   --  3.0    GFR: CrCl cannot be calculated (Unknown ideal weight.).  Liver Function Tests: Recent Labs  Lab 06/22/20 1538 06/22/20 2200 06/23/20 0530  AST 64* 64* 37  ALT 65* 47* 45*  ALKPHOS 86 73 66  BILITOT 1.3* 1.1 1.4*  PROT 8.4* 6.6 6.4*  ALBUMIN 4.8 3.7 3.6    Recent Labs  Lab 06/22/20 1538  LIPASE 26    Thyroid Function Tests: Recent Labs    06/22/20 1538  TSH 1.295  FREET4 0.82    Recent Results (from the past 240 hour(s))  Resp Panel by RT-PCR (Flu A&B, Covid) Nasopharyngeal Swab     Status: None   Collection Time: 06/22/20  3:38 PM   Specimen: Nasopharyngeal Swab; Nasopharyngeal(NP) swabs in vial transport medium  Result Value Ref Range Status   SARS Coronavirus 2 by RT PCR NEGATIVE NEGATIVE Final    Comment:  (NOTE) SARS-CoV-2 target nucleic acids are NOT DETECTED.  The SARS-CoV-2 RNA is generally detectable in upper respiratory specimens during the acute phase of infection. The lowest concentration of SARS-CoV-2 viral copies this assay can detect is 138 copies/mL. A negative result does not preclude SARS-Cov-2 infection and should not be used as the sole basis for treatment or other patient management decisions. A negative result may occur with  improper specimen collection/handling, submission of specimen other than nasopharyngeal swab, presence of viral mutation(s) within the areas targeted by this assay, and inadequate number of viral copies(<138 copies/mL). A negative result must be combined with clinical observations, patient history, and epidemiological information. The expected result is Negative.  Fact Sheet for Patients:  EntrepreneurPulse.com.au  Fact Sheet for Healthcare Providers:  IncredibleEmployment.be  This test is no t  yet approved or cleared by the [place] and  has been authorized for detection and/or diagnosis of SARS-CoV-2 by FDA under an Emergency Use Authorization (EUA). This EUA will remain  in effect (meaning this test can be used) for the duration of the COVID-19 declaration under Section 564(b)(1) of the Act, 21 U.S.C.section 360bbb-3(b)(1), unless the authorization is terminated  or revoked sooner.       Influenza A by PCR NEGATIVE NEGATIVE Final   Influenza B by PCR NEGATIVE NEGATIVE Final    Comment: (NOTE) The Xpert Xpress SARS-CoV-2/FLU/RSV plus assay is intended as an aid in the diagnosis of influenza from Nasopharyngeal swab specimens and should not be used as a sole basis for treatment. Nasal washings and aspirates are unacceptable for Xpert Xpress SARS-CoV-2/FLU/RSV testing.  Fact Sheet for Patients: EntrepreneurPulse.com.au  Fact Sheet for Healthcare  Providers: IncredibleEmployment.be  This test is not yet approved or cleared by the [place] FDA and has been authorized for detection and/or diagnosis of SARS-CoV-2 by FDA under an Emergency Use Authorization (EUA). This EUA will remain in effect (meaning this test can be used) for the duration of the COVID-19 declaration under Section 564(b)(1) of the Act, 21 U.S.C. section 360bbb-3(b)(1), unless the authorization is terminated or revoked.  Performed at [facility], [place] [address], [place], [place] [address]       Radiology Studies: CT Angio Chest PE W/Cm &/Or Wo Cm  Result Date: 06/22/2020 CLINICAL DATA:  Shortness of breath. EXAM: CT ANGIOGRAPHY CHEST WITH CONTRAST TECHNIQUE: Multidetector CT imaging of the chest was performed using the standard protocol during bolus administration of intravenous contrast. Multiplanar CT image reconstructions and MIPs were obtained to evaluate the vascular anatomy. CONTRAST:  144mL OMNIPAQUE IOHEXOL 350 MG/ML SOLN COMPARISON:  December 27, 2016 FINDINGS: Cardiovascular: Satisfactory opacification of the pulmonary arteries to the segmental level. No evidence of pulmonary embolism. Normal heart size. No pericardial effusion. Mediastinum/Nodes: No enlarged mediastinal, hilar, or axillary lymph nodes. Thyroid gland, trachea, and esophagus demonstrate no significant findings. Lungs/Pleura: A stable 5 mm noncalcified lung nodule is seen within the inferior aspect of the right upper lobe (axial CT image 57, CT series number 12). Mild atelectasis is seen within the posteromedial aspect of the right lower lobe. There is no evidence of a pleural effusion or pneumothorax. Upper Abdomen: There is a moderate-sized hiatal hernia. Diffuse fatty infiltration of the liver parenchyma is seen. Musculoskeletal: No chest wall abnormality. No acute or significant osseous findings. Review of the MIP images confirms the above findings.  IMPRESSION: 1. No evidence of pulmonary embolism. 2. 5 mm noncalcified right upper lobe lung nodule, stable since December 27, 2016 and likely benign. 3. Moderate-sized hiatal hernia. 4. Fatty liver. Electronically Signed   By: Virgina Norfolk M.D.   On: 06/22/2020 20:16   DG Chest Portable 1 View  Result Date: 06/22/2020 CLINICAL DATA:  Shortness of breath. EXAM: PORTABLE CHEST 1 VIEW COMPARISON:  04/03/2020 FINDINGS: 1610 hours. Low lung volumes. The lungs are clear without focal pneumonia, edema, pneumothorax or pleural effusion. The cardiopericardial silhouette is within normal limits for size. The visualized bony structures of the thorax show no acute abnormality. Telemetry leads overlie the chest. IMPRESSION: No active disease. Electronically Signed   By: Misty Stanley M.D.   On: 06/22/2020 16:25       LOS: 1 day   [facility] Pager on [email or web address]  06/23/2020, 11:19 AM

## 2020-06-23 NOTE — H&P (Signed)
History and Physical    FELIPE PALUCH BHA:[id number] DOB: [birth year] DOA: 06/22/2020  PCP: [name], MD   Patient coming from: Home   Chief Complaint: palpitations   HPI: Ruben [name] is a 70 y.o. male with medical history significant for alcoholism, peptic ulcer disease, hypertension, insomnia, and pulmonary nodule, now presenting to the emergency department for evaluation of palpitations and general malaise.  Patient reports occasional palpitations, had been told in the past that it was related to his excessive alcohol use, and now presents with recurrent palpitations while trying to cut back on alcohol intake.  Patient states that he typically has 18 shots of vodka daily but developed recurrent palpitations over the last couple days and so he has been trying to cut back.  Last drink was around 11 AM.  Reported in triage that he had taken more gabapentin and doxepin then prescribed but states that he while he did take three 10 mg tablets of doxepin, he has not taken any gabapentin recently.  He denies any other ingestion or any suicidal thoughts or intention to harm himself.    ED Course: Upon arrival to the ED, patient is found to be afebrile, saturating well on room air, tachycardic to 140, and with stable blood pressure.  EKG features sinus tachycardia with rate 139 and PVCs.  Chest x-ray negative for acute cardiopulmonary disease.  Chemistry panel notable for creatinine 1.44, serum bicarbonate 10, anion gap 24, and mild elevation in transaminases.  CBC is unremarkable.  TSH is normal.  COVID-19 PCR negative.  D-dimer 0.99.  CTA chest is negative for PE, notable for stable pulmonary nodule, moderate hiatal hernia, and fatty liver.  Patient was given 2.5 L of IV fluids, 2 mg IV Ativan, and Zofran in the ED.  Review of Systems:  All other systems reviewed and apart from HPI, are negative.  Past Medical History:  Diagnosis Date  . Alcoholism ([place])   . Alcoholism ([place])   . Aortic  atherosclerosis ([place]) 12/26/2016   Noted on CT  . Arthritis   . BPH (benign prostatic hyperplasia)   . Chronic constipation    takes magnesium  . Duodenal ulcer   . Dyspnea 12/17/2017  . ED (erectile dysfunction)   . Foley catheter in place    history of no longer in place  . GERD (gastroesophageal reflux disease)   . Heart palpitations    Occ  . Hiatal hernia   . History of adenomatous polyp of colon    tubular adenoma's 2005  . History of Helicobacter pylori infection    2008  . Hypercholesteremia   . Hypertension   . MR (mitral regurgitation)    Mild, noted on ECHO  . Pre-diabetes   . Pulmonary nodule 12/26/2016   noted on CT, 5 x 3 mm nodular opacity right upper lobe  . Rosacea   . Schatzki's ring    last dilated 02/ 2016  . Urinary retention    history of  . Varicocele 02/25/2014   Small to moderate left varicocele    Past Surgical History:  Procedure Laterality Date  . BALLOON DILATION N/A 06/08/2014   Procedure: BALLOON DILATION;  Surgeon: Garlan Fair, MD;  Location: [facility];  Service: Endoscopy;  Laterality: N/A;  . COLONOSCOPY WITH PROPOFOL N/A 06/08/2014   Procedure: COLONOSCOPY WITH PROPOFOL;  Surgeon: Garlan Fair, MD;  Location: [facility];  Service: Endoscopy;  Laterality: N/A;  . ESOPHAGOGASTRODUODENOSCOPY N/A 06/08/2014   Procedure: ESOPHAGOGASTRODUODENOSCOPY (EGD);  Surgeon: Garlan Fair, MD;  Location: [facility];  Service: Endoscopy;  Laterality: N/A;  . ESOPHAGOGASTRODUODENOSCOPY (EGD) WITH ESOPHAGEAL DILATION N/A 12/31/2012   Procedure: ESOPHAGOGASTRODUODENOSCOPY (EGD) WITH ESOPHAGEAL DILATION;  Surgeon: Garlan Fair, MD;  Location: [facility];  Service: Endoscopy;  Laterality: N/A;  . GREEN LIGHT LASER TURP (TRANSURETHRAL RESECTION OF PROSTATE N/A 12/28/2015   Procedure: GREEN LIGHT LASER TURP (TRANSURETHRAL RESECTION OF PROSTATE;  Surgeon: Festus Aloe, MD;  Location: [facility], [place];  Service: Urology;   Laterality: N/A;    Social History:   reports that he has never smoked. He has never used smokeless tobacco. He reports current alcohol use. He reports that he does not use drugs.  No Known Allergies  Family History  Problem Relation Age of Onset  . Heart failure Mother   . Stroke Father      Prior to Admission medications   Medication Sig Start Date End Date Taking? Authorizing Provider  amLODipine (NORVASC) 5 MG tablet Take 1 tablet (5 mg total) by mouth daily. 12/30/19 12/29/20 Yes Mercy Riding, MD  atorvastatin (LIPITOR) 20 MG tablet Take 1 tablet (20 mg total) by mouth daily. 10/06/19 12/22/19 Yes Alma Friendly, MD  clonazePAM (KLONOPIN) 1 MG tablet Take 1 mg by mouth 3 (three) times daily as needed for anxiety. 04/22/20  Yes [provider]  doxepin (SINEQUAN) 10 MG capsule Take 30 mg by mouth at bedtime. 05/12/20  Yes [provider]  Magnesium 400 MG TABS Take 1,600 mg by mouth daily.   Yes [provider]  magnesium citrate SOLN Take 296 mLs (1 Bottle total) by mouth daily as needed for severe constipation. 12/30/19  Yes Mercy Riding, MD  metoprolol tartrate (LOPRESSOR) 25 MG tablet Take 1 tablet (25 mg total) by mouth 2 (two) times daily. Patient taking differently: Take 50 mg by mouth 2 (two) times daily. 10/06/19 12/22/19 Yes Alma Friendly, MD  Multiple Vitamin (MULTIVITAMIN WITH MINERALS) TABS tablet Take 1 tablet by mouth daily. 12/30/19  Yes Mercy Riding, MD  traZODone (DESYREL) 150 MG tablet Take 150 mg by mouth at bedtime. 06/04/20  Yes [provider]  feeding supplement, ENSURE ENLIVE, (ENSURE ENLIVE) LIQD Take 237 mLs by mouth 2 (two) times daily between meals. Patient not taking: Reported on 06/22/2020 12/30/19   Mercy Riding, MD  gabapentin (NEURONTIN) 100 MG capsule Take 1 capsule (100 mg total) by mouth 2 (two) times daily. Patient not taking: No sig reported 04/08/20 08/06/20  Nita Sells, MD  melatonin 3 MG TABS  tablet Take 1 tablet (3 mg total) by mouth at bedtime. Patient not taking: No sig reported 04/08/20   Nita Sells, MD  polyethylene glycol (MIRALAX / GLYCOLAX) 17 g packet Take 17 g by mouth 2 (two) times daily as needed for mild constipation. Patient not taking: Reported on 06/22/2020 12/30/19   Mercy Riding, MD  senna-docusate (SENOKOT-S) 8.6-50 MG tablet Take 1 tablet by mouth 2 (two) times daily as needed for moderate constipation. Patient not taking: Reported on 06/22/2020 12/30/19   Mercy Riding, MD    Physical Exam: Vitals:   06/22/20 2051 06/22/20 2100 06/22/20 2137 06/22/20 2200  BP: (!) 146/80 139/88 139/88 (!) 144/92  Pulse: (!) 128 (!) 134 (!) 134 (!) 130  Resp:  18  12  Temp:      TempSrc:      SpO2:  97%  94%    Constitutional: No respiratory distress, diaphoretic, tremulous  Eyes: PERTLA, lids and conjunctivae normal ENMT: Mucous membranes are moist. Posterior pharynx clear of any exudate or lesions.   Neck: normal, supple, no masses, no thyromegaly Respiratory: no wheezing, no crackles. No accessory muscle use.  Cardiovascular: Rate ~120 and regular. No extremity edema.  Abdomen: No distension, no tenderness, soft. Bowel sounds active.  Musculoskeletal: no clubbing / cyanosis. No joint deformity upper and lower extremities.   Skin: no significant rashes, lesions, ulcers. Warm, dry, well-perfused. Neurologic: CN 2-12 grossly intact. Sensation intact. Moving all extremities. Tremulous.  Psychiatric: Alert and oriented to person, place, and situation. Calm and cooperative.    Labs and Imaging on Admission: I have personally reviewed following labs and imaging studies  CBC: Recent Labs  Lab 06/22/20 1538  WBC 9.8  HGB 16.8  HCT 51.3  MCV 89.2  PLT 970   Basic Metabolic Panel: Recent Labs  Lab 06/22/20 1538 06/22/20 2200  NA 138 135  K 4.2 4.5  CL 99 101  CO2 12* 10*  GLUCOSE 76 81  BUN 22 18  CREATININE 1.33* 1.44*  CALCIUM 8.9 7.9*    GFR: CrCl cannot be calculated (Unknown ideal weight.). Liver Function Tests: Recent Labs  Lab 06/22/20 1538 06/22/20 2200  AST 64* 64*  ALT 65* 47*  ALKPHOS 86 73  BILITOT 1.3* 1.1  PROT 8.4* 6.6  ALBUMIN 4.8 3.7   Recent Labs  Lab 06/22/20 1538  LIPASE 26   No results for input(s): AMMONIA in the last 168 hours. Coagulation Profile: No results for input(s): INR, PROTIME in the last 168 hours. Cardiac Enzymes: No results for input(s): CKTOTAL, CKMB, CKMBINDEX, TROPONINI in the last 168 hours. BNP (last 3 results) No results for input(s): PROBNP in the last 8760 hours. HbA1C: No results for input(s): HGBA1C in the last 72 hours. CBG: No results for input(s): GLUCAP in the last 168 hours. Lipid Profile: No results for input(s): CHOL, HDL, LDLCALC, TRIG, CHOLHDL, LDLDIRECT in the last 72 hours. Thyroid Function Tests: Recent Labs    06/22/20 1538  TSH 1.295  FREET4 0.82   Anemia Panel: No results for input(s): VITAMINB12, FOLATE, FERRITIN, TIBC, IRON, RETICCTPCT in the last 72 hours. Urine analysis:    Component Value Date/Time   COLORURINE YELLOW 06/22/2020 2300   APPEARANCEUR CLEAR 06/22/2020 2300   LABSPEC 1.034 (H) 06/22/2020 2300   PHURINE 5.0 06/22/2020 2300   GLUCOSEU NEGATIVE 06/22/2020 2300   HGBUR MODERATE (A) 06/22/2020 2300   BILIRUBINUR NEGATIVE 06/22/2020 2300   KETONESUR 80 (A) 06/22/2020 2300   PROTEINUR 30 (A) 06/22/2020 2300   NITRITE NEGATIVE 06/22/2020 2300   LEUKOCYTESUR NEGATIVE 06/22/2020 2300   Sepsis Labs: @LABRCNTIP (procalcitonin:4,lacticidven:4) ) Recent Results (from the past 240 hour(s))  Resp Panel by RT-PCR (Flu A&B, Covid) Nasopharyngeal Swab     Status: None   Collection Time: 06/22/20  3:38 PM   Specimen: Nasopharyngeal Swab; Nasopharyngeal(NP) swabs in vial transport medium  Result Value Ref Range Status   SARS Coronavirus 2 by RT PCR NEGATIVE NEGATIVE Final    Comment: (NOTE) SARS-CoV-2 target nucleic acids are  NOT DETECTED.  The SARS-CoV-2 RNA is generally detectable in upper respiratory specimens during the acute phase of infection. The lowest concentration of SARS-CoV-2 viral copies this assay can detect is 138 copies/mL. A negative result does not preclude SARS-Cov-2 infection and should not be used as the sole basis for treatment or other patient management decisions. A negative result may occur with  improper specimen collection/handling, submission of specimen other than  nasopharyngeal swab, presence of viral mutation(s) within the areas targeted by this assay, and inadequate number of viral copies(<138 copies/mL). A negative result must be combined with clinical observations, patient history, and epidemiological information. The expected result is Negative.  Fact Sheet for Patients:  EntrepreneurPulse.com.au  Fact Sheet for Healthcare Providers:  IncredibleEmployment.be  This test is no t yet approved or cleared by the [place] FDA and  has been authorized for detection and/or diagnosis of SARS-CoV-2 by FDA under an Emergency Use Authorization (EUA). This EUA will remain  in effect (meaning this test can be used) for the duration of the COVID-19 declaration under Section 564(b)(1) of the Act, 21 U.S.C.section 360bbb-3(b)(1), unless the authorization is terminated  or revoked sooner.       Influenza A by PCR NEGATIVE NEGATIVE Final   Influenza B by PCR NEGATIVE NEGATIVE Final    Comment: (NOTE) The Xpert Xpress SARS-CoV-2/FLU/RSV plus assay is intended as an aid in the diagnosis of influenza from Nasopharyngeal swab specimens and should not be used as a sole basis for treatment. Nasal washings and aspirates are unacceptable for Xpert Xpress SARS-CoV-2/FLU/RSV testing.  Fact Sheet for Patients: EntrepreneurPulse.com.au  Fact Sheet for Healthcare Providers: IncredibleEmployment.be  This test is not  yet approved or cleared by the [place] FDA and has been authorized for detection and/or diagnosis of SARS-CoV-2 by FDA under an Emergency Use Authorization (EUA). This EUA will remain in effect (meaning this test can be used) for the duration of the COVID-19 declaration under Section 564(b)(1) of the Act, 21 U.S.C. section 360bbb-3(b)(1), unless the authorization is terminated or revoked.  Performed at [facility], [place] [address] [place], [place] [address]      Radiological Exams on Admission: CT Angio Chest PE W/Cm &/Or Wo Cm  Result Date: 06/22/2020 CLINICAL DATA:  Shortness of breath. EXAM: CT ANGIOGRAPHY CHEST WITH CONTRAST TECHNIQUE: Multidetector CT imaging of the chest was performed using the standard protocol during bolus administration of intravenous contrast. Multiplanar CT image reconstructions and MIPs were obtained to evaluate the vascular anatomy. CONTRAST:  189mL OMNIPAQUE IOHEXOL 350 MG/ML SOLN COMPARISON:  December 27, 2016 FINDINGS: Cardiovascular: Satisfactory opacification of the pulmonary arteries to the segmental level. No evidence of pulmonary embolism. Normal heart size. No pericardial effusion. Mediastinum/Nodes: No enlarged mediastinal, hilar, or axillary lymph nodes. Thyroid gland, trachea, and esophagus demonstrate no significant findings. Lungs/Pleura: A stable 5 mm noncalcified lung nodule is seen within the inferior aspect of the right upper lobe (axial CT image 57, CT series number 12). Mild atelectasis is seen within the posteromedial aspect of the right lower lobe. There is no evidence of a pleural effusion or pneumothorax. Upper Abdomen: There is a moderate-sized hiatal hernia. Diffuse fatty infiltration of the liver parenchyma is seen. Musculoskeletal: No chest wall abnormality. No acute or significant osseous findings. Review of the MIP images confirms the above findings. IMPRESSION: 1. No evidence of pulmonary embolism. 2. 5 mm  noncalcified right upper lobe lung nodule, stable since December 27, 2016 and likely benign. 3. Moderate-sized hiatal hernia. 4. Fatty liver. Electronically Signed   By: Virgina Norfolk M.D.   On: 06/22/2020 20:16   DG Chest Portable 1 View  Result Date: 06/22/2020 CLINICAL DATA:  Shortness of breath. EXAM: PORTABLE CHEST 1 VIEW COMPARISON:  04/03/2020 FINDINGS: 1610 hours. Low lung volumes. The lungs are clear without focal pneumonia, edema, pneumothorax or pleural effusion. The cardiopericardial silhouette is within normal limits for size. The visualized bony structures of  the thorax show no acute abnormality. Telemetry leads overlie the chest. IMPRESSION: No active disease. Electronically Signed   By: Misty Stanley M.D.   On: 06/22/2020 16:25    EKG: Independently reviewed. Sinus tachycardia, rate 139, PVCs.   Assessment/Plan   1. Alcohol withdrawal  - Presents with palpitations and tremor while trying to cut back on EtOH intake, improved with Ativan in ED but remains tachycardic and now diaphoretic  - Continue to monitor with CIWA scoring, continue treatment with Ativan  - Reports he wants to achieve abstinence, will consult SW for possible resources   2. Metabolic acidosis  - Serum bicarbonate 10 and AG 24  - Most likely alcoholic ketoacidosis  - He reported taking 3 doses of doxepin (still within therapeutic range) and denied other ingestions or SI  - Check UA, ABG, salicylates, volatiles, lactate, and repeat chem panel in am   3. Mild renal insufficiency  - SCr is 1.44 in ED, up from priors in 1-1.1 range  - Continue IVF hydration, repeat chem panel in am   4. Hypertension  - Continue Norvasc and Lopressor   5. Elevated transaminases  - Mild elevation in transaminases noted without RUQ tenderness  - He has fatty liver on CT in ED, negative viral hepatitis testing recently, likely alcohol abuse, will trend    DVT prophylaxis: Lovenox  Code Status: Full  Level of Care:  Level of care: Telemetry Family Communication: None present  Disposition Plan:  Patient is from: Home  Anticipated d/c is to: TBD Anticipated d/c date is: 3/18 or 06/26/20 Patient currently: Pending additional laboratory work up and improvement in alcohol withdrawal, conversion to oral medications  Consults called: None  Admission status: Inpatient     Vianne Bulls, MD [facility]  06/23/2020, 12:17 AM

## 2020-06-24 ENCOUNTER — Other Ambulatory Visit: Payer: Self-pay

## 2020-06-24 LAB — COMPREHENSIVE METABOLIC PANEL
ALT: 33 U/L (ref 0–44)
AST: 25 U/L (ref 15–41)
Albumin: 3.1 g/dL — ABNORMAL LOW (ref 3.5–5.0)
Alkaline Phosphatase: 52 U/L (ref 38–126)
Anion gap: 6 (ref 5–15)
BUN: 20 mg/dL (ref 8–23)
CO2: 25 mmol/L (ref 22–32)
Calcium: 8.2 mg/dL — ABNORMAL LOW (ref 8.9–10.3)
Chloride: 106 mmol/L (ref 98–111)
Creatinine, Ser: 1.06 mg/dL (ref 0.61–1.24)
GFR, Estimated: >60 mL/min (ref >60)
Glucose, Bld: 127 mg/dL — ABNORMAL HIGH (ref 70–99)
Potassium: 3.2 mmol/L — ABNORMAL LOW (ref 3.5–5.1)
Sodium: 137 mmol/L (ref 135–145)
Total Bilirubin: 1.7 mg/dL — ABNORMAL HIGH (ref 0.3–1.2)
Total Protein: 5.6 g/dL — ABNORMAL LOW (ref 6.5–8.1)

## 2020-06-24 LAB — CBC
HCT: 38.8 % — ABNORMAL LOW (ref 39.0–52.0)
Hemoglobin: 13.2 g/dL (ref 13.0–17.0)
MCH: 30 pg (ref 26.0–34.0)
MCHC: 34 g/dL (ref 30.0–36.0)
MCV: 88.2 fL (ref 80.0–100.0)
Platelets: 90 10*3/uL — ABNORMAL LOW (ref 150–400)
RBC: 4.4 MIL/uL (ref 4.22–5.81)
RDW: 14.4 % (ref 11.5–15.5)
WBC: 4.9 10*3/uL (ref 4.0–10.5)
nRBC: 0 % (ref 0.0–0.2)

## 2020-06-24 MED ORDER — POTASSIUM CHLORIDE CRYS ER 20 MEQ PO TBCR
40.0000 meq | EXTENDED_RELEASE_TABLET | ORAL | Status: AC
Start: 2020-06-24 — End: 2020-06-24
  Administered 2020-06-24 (×2): 40 meq via ORAL
  Filled 2020-06-24 (×2): qty 2

## 2020-06-24 NOTE — Progress Notes (Signed)
[facility] PROGRESS NOTE   Ruben [name] HEN:[id number] DOB: Jul 03, 1950 DOA: 06/22/2020  PCP: [name], MD  Brief History/Interval Summary: 70 y.o. male with medical history significant for alcoholism, peptic ulcer disease, hypertension, insomnia, and pulmonary nodule, presented to the emergency department for evaluation of palpitations and general malaise.  Patient reports occasional palpitations, had been told in the past that it was related to his excessive alcohol use, and now presents with recurrent palpitations while trying to cut back on alcohol intake.  Patient states that he typically has 18 shots of vodka daily but developed recurrent palpitations over the last couple days and so he has been trying to cut back.   Consultants: None  Procedures: None  Antibiotics: Anti-infectives (From admission, onward)   None      Subjective/Interval History: Patient mentions that he is feeling better.  Not as tremulous as yesterday.  Denies any nausea vomiting.  Occasional blurry vision though none currently.  Is followed by ophthalmologist.      Assessment/Plan:  Alcohol withdrawal Patient was placed on CIWA protocol.  Withdrawal symptoms appear to be improving.  He will need to be ambulated in the hallway.  Social worker to see the patient to provide him with outpatient resources for rehab.  Continue thiamine multivitamin and folic acid.  He does have some nystagmus on examination.    Palpitations Likely due to alcohol use.  Has a longstanding history of same.  Continue metoprolol. Thyroid function tests were normal.  EKG shows sinus tachycardia.  Metabolic acidosis Most likely due to alcoholic ketoacidosis.  Improved with hydration.  Okay to discontinue sodium bicarbonate. Urine drug screen was unremarkable.  Blood toxicology screen was also unremarkable except for elevation in alcohol level.  Mild acute kidney injury Creatinine was 1.44 at admission.  Now appears to  be back to baseline.  Cut back on IV fluids.  Oral intake seems to be improving as well.  Monitor urine output.  Essential hypertension Continue amlodipine and metoprolol.  Blood pressure is reasonably well controlled  Thrombocytopenia Drop in platelet counts noted.  Could be due to alcohol suppression.  Stop his Lovenox.  Recheck labs tomorrow.  No evidence of bleeding.  Hyponatremia and hypomagnesemia Replace potassium.  Magnesium was repleted yesterday.  We will recheck tomorrow.    Transaminitis Mild elevation in liver function tests due to alcoholism.  Fatty liver noted on CT scan.  Had negative viral hepatitis testing recently.  Incidental findings on CT angiogram chest 5 mm noncalcified right upper lobe lung nodule, stable since December 27, 2016 and likely benign.  Moderate-sized hiatal hernia.  Outpatient follow-up  DVT Prophylaxis: Stop Lovenox due to further drop in platelet count.  SCDs. Code Status: Full code Family Communication: Discussed with the patient Disposition Plan: Hopefully home when improved  Status is: Inpatient  Remains inpatient appropriate because:IV treatments appropriate due to intensity of illness or inability to take PO and Inpatient level of care appropriate due to severity of illness   Dispo: The patient is from: Home              Anticipated d/c is to: Home              Patient currently is not medically stable to d/c.   Difficult to place patient No      Medications:  Scheduled: . amLODipine  5 mg Oral Daily  . atorvastatin  20 mg Oral Daily  . folic acid  1 mg Oral Daily  .  LORazepam  0-4 mg Intravenous Q6H   Followed by  . [START ON 06/25/2020] LORazepam  0-4 mg Intravenous Q12H  . metoprolol tartrate  50 mg Oral BID  . multivitamin with minerals  1 tablet Oral Daily  . potassium chloride  40 mEq Oral Q4H  . thiamine  100 mg Oral Daily   Or  . thiamine  100 mg Intravenous Daily   Continuous: . sodium chloride 75 mL/hr at  06/23/20 1227   [id number] **OR** acetaminophen, LORazepam **OR** LORazepam, ondansetron **OR** ondansetron (ZOFRAN) IV, polyethylene glycol   Objective:  Vital Signs  Vitals:   06/23/20 2004 06/23/20 2333 06/24/20 0518 06/24/20 0610  BP: 124/69 112/71 114/73   Pulse: 91 82 72   Resp: 18 18 18    Temp: (!) 97.4 F (36.3 C) 98 F (36.7 C) 98 F (36.7 C)   TempSrc: Oral Oral    SpO2: 95% 97% 95%   Weight:    89 kg  Height:   5\' 11"  (1.803 m)     Intake/Output Summary (Last 24 hours) at 06/24/2020 1005 Last data filed at 06/24/2020 0245 Gross per 24 hour  Intake 245.22 ml  Output 725 ml  Net -479.78 ml   Filed Weights   06/24/20 0610  Weight: 89 kg    General appearance: Awake alert.  In no distress Nystagmus noted Resp: Clear to auscultation bilaterally.  Normal effort Cardio: S1-S2 is normal regular.  No S3-S4.  No rubs murmurs or bruit GI: Abdomen is soft.  Nontender nondistended.  Bowel sounds are present normal.  No masses organomegaly Extremities: No edema.  Full range of motion of lower extremities. Neurologic: Alert and oriented x3.  No facial asymmetry.  Motor strength equal bilateral upper and lower extremities.  Tongue is midline.  No pronator drift.    Lab Results:  Data Reviewed: I have personally reviewed following labs and imaging studies  CBC: Recent Labs  Lab 06/22/20 1538 06/23/20 0530 06/24/20 0339  WBC 9.8 10.9* 4.9  HGB 16.8 14.2 13.2  HCT 51.3 43.3 38.8*  MCV 89.2 90.0 88.2  PLT 178 116* 90*    Basic Metabolic Panel: Recent Labs  Lab 06/22/20 1538 06/22/20 2200 06/23/20 0530 06/24/20 0339  NA 138 135 134* 137  K 4.2 4.5 4.6 3.2*  CL 99 101 102 106  CO2 12* 10* 11* 25  GLUCOSE 76 81 124* 127*  BUN 22 18 19 20   CREATININE 1.33* 1.44* 1.15 1.06  CALCIUM 8.9 7.9* 7.7* 8.2*  MG  --   --  1.6*  --   PHOS  --   --  3.0  --     GFR: Estimated Creatinine Clearance: 71 mL/min (by C-G formula based on SCr of 1.06  mg/dL).  Liver Function Tests: Recent Labs  Lab 06/22/20 1538 06/22/20 2200 06/23/20 0530 06/24/20 0339  AST 64* 64* 37 25  ALT 65* 47* 45* 33  ALKPHOS 86 73 66 52  BILITOT 1.3* 1.1 1.4* 1.7*  PROT 8.4* 6.6 6.4* 5.6*  ALBUMIN 4.8 3.7 3.6 3.1*    Recent Labs  Lab 06/22/20 1538  LIPASE 26    Thyroid Function Tests: Recent Labs    06/22/20 1538  TSH 1.295  FREET4 0.82    Recent Results (from the past 240 hour(s))  Resp Panel by RT-PCR (Flu A&B, Covid) Nasopharyngeal Swab     Status: None   Collection Time: 06/22/20  3:38 PM   Specimen: Nasopharyngeal Swab; Nasopharyngeal(NP) swabs in vial transport medium  Result Value Ref Range Status   SARS Coronavirus 2 by RT PCR NEGATIVE NEGATIVE Final    Comment: (NOTE) SARS-CoV-2 target nucleic acids are NOT DETECTED.  The SARS-CoV-2 RNA is generally detectable in upper respiratory specimens during the acute phase of infection. The lowest concentration of SARS-CoV-2 viral copies this assay can detect is 138 copies/mL. A negative result does not preclude SARS-Cov-2 infection and should not be used as the sole basis for treatment or other patient management decisions. A negative result may occur with  improper specimen collection/handling, submission of specimen other than nasopharyngeal swab, presence of viral mutation(s) within the areas targeted by this assay, and inadequate number of viral copies(<138 copies/mL). A negative result must be combined with clinical observations, patient history, and epidemiological information. The expected result is Negative.  Fact Sheet for Patients:  EntrepreneurPulse.com.au  Fact Sheet for Healthcare Providers:  IncredibleEmployment.be  This test is no t yet approved or cleared by the [place] FDA and  has been authorized for detection and/or diagnosis of SARS-CoV-2 by FDA under an Emergency Use Authorization (EUA). This EUA will remain  in  effect (meaning this test can be used) for the duration of the COVID-19 declaration under Section 564(b)(1) of the Act, 21 U.S.C.section 360bbb-3(b)(1), unless the authorization is terminated  or revoked sooner.       Influenza A by PCR NEGATIVE NEGATIVE Final   Influenza B by PCR NEGATIVE NEGATIVE Final    Comment: (NOTE) The Xpert Xpress SARS-CoV-2/FLU/RSV plus assay is intended as an aid in the diagnosis of influenza from Nasopharyngeal swab specimens and should not be used as a sole basis for treatment. Nasal washings and aspirates are unacceptable for Xpert Xpress SARS-CoV-2/FLU/RSV testing.  Fact Sheet for Patients: EntrepreneurPulse.com.au  Fact Sheet for Healthcare Providers: IncredibleEmployment.be  This test is not yet approved or cleared by the [place] FDA and has been authorized for detection and/or diagnosis of SARS-CoV-2 by FDA under an Emergency Use Authorization (EUA). This EUA will remain in effect (meaning this test can be used) for the duration of the COVID-19 declaration under Section 564(b)(1) of the Act, 21 U.S.C. section 360bbb-3(b)(1), unless the authorization is terminated or revoked.  Performed at [facility], [place] [address], [place], [place] [address]       Radiology Studies: CT Angio Chest PE W/Cm &/Or Wo Cm  Result Date: 06/22/2020 CLINICAL DATA:  Shortness of breath. EXAM: CT ANGIOGRAPHY CHEST WITH CONTRAST TECHNIQUE: Multidetector CT imaging of the chest was performed using the standard protocol during bolus administration of intravenous contrast. Multiplanar CT image reconstructions and MIPs were obtained to evaluate the vascular anatomy. CONTRAST:  153mL OMNIPAQUE IOHEXOL 350 MG/ML SOLN COMPARISON:  December 27, 2016 FINDINGS: Cardiovascular: Satisfactory opacification of the pulmonary arteries to the segmental level. No evidence of pulmonary embolism. Normal heart size. No  pericardial effusion. Mediastinum/Nodes: No enlarged mediastinal, hilar, or axillary lymph nodes. Thyroid gland, trachea, and esophagus demonstrate no significant findings. Lungs/Pleura: A stable 5 mm noncalcified lung nodule is seen within the inferior aspect of the right upper lobe (axial CT image 57, CT series number 12). Mild atelectasis is seen within the posteromedial aspect of the right lower lobe. There is no evidence of a pleural effusion or pneumothorax. Upper Abdomen: There is a moderate-sized hiatal hernia. Diffuse fatty infiltration of the liver parenchyma is seen. Musculoskeletal: No chest wall abnormality. No acute or significant osseous findings. Review of the MIP images confirms the above findings. IMPRESSION: 1. No evidence of  pulmonary embolism. 2. 5 mm noncalcified right upper lobe lung nodule, stable since December 27, 2016 and likely benign. 3. Moderate-sized hiatal hernia. 4. Fatty liver. Electronically Signed   By: Virgina Norfolk M.D.   On: 06/22/2020 20:16   DG Chest Portable 1 View  Result Date: 06/22/2020 CLINICAL DATA:  Shortness of breath. EXAM: PORTABLE CHEST 1 VIEW COMPARISON:  04/03/2020 FINDINGS: 1610 hours. Low lung volumes. The lungs are clear without focal pneumonia, edema, pneumothorax or pleural effusion. The cardiopericardial silhouette is within normal limits for size. The visualized bony structures of the thorax show no acute abnormality. Telemetry leads overlie the chest. IMPRESSION: No active disease. Electronically Signed   By: Misty Stanley M.D.   On: 06/22/2020 16:25       LOS: 2 days   [facility] Pager on [email or web address]  06/24/2020, 10:05 AM

## 2020-06-25 DIAGNOSIS — D696 Thrombocytopenia, unspecified: Secondary | ICD-10-CM

## 2020-06-25 LAB — COMPREHENSIVE METABOLIC PANEL
ALT: 36 U/L (ref 0–44)
AST: 36 U/L (ref 15–41)
Albumin: 3.5 g/dL (ref 3.5–5.0)
Alkaline Phosphatase: 58 U/L (ref 38–126)
Anion gap: 10 (ref 5–15)
BUN: 14 mg/dL (ref 8–23)
CO2: 22 mmol/L (ref 22–32)
Calcium: 8.8 mg/dL — ABNORMAL LOW (ref 8.9–10.3)
Chloride: 102 mmol/L (ref 98–111)
Creatinine, Ser: 0.8 mg/dL (ref 0.61–1.24)
GFR, Estimated: >60 mL/min (ref >60)
Glucose, Bld: 138 mg/dL — ABNORMAL HIGH (ref 70–99)
Potassium: 3.5 mmol/L (ref 3.5–5.1)
Sodium: 134 mmol/L — ABNORMAL LOW (ref 135–145)
Total Bilirubin: 1 mg/dL (ref 0.3–1.2)
Total Protein: 6.4 g/dL — ABNORMAL LOW (ref 6.5–8.1)

## 2020-06-25 LAB — CBC
HCT: 40.6 % (ref 39.0–52.0)
Hemoglobin: 13.9 g/dL (ref 13.0–17.0)
MCH: 29.7 pg (ref 26.0–34.0)
MCHC: 34.2 g/dL (ref 30.0–36.0)
MCV: 86.8 fL (ref 80.0–100.0)
Platelets: 86 10*3/uL — ABNORMAL LOW (ref 150–400)
RBC: 4.68 MIL/uL (ref 4.22–5.81)
RDW: 14.1 % (ref 11.5–15.5)
WBC: 5.2 10*3/uL (ref 4.0–10.5)
nRBC: 0 % (ref 0.0–0.2)

## 2020-06-25 LAB — MAGNESIUM: Magnesium: 1.7 mg/dL (ref 1.7–2.4)

## 2020-06-25 MED ORDER — HALOPERIDOL LACTATE 5 MG/ML IJ SOLN
5.0000 mg | Freq: Once | INTRAMUSCULAR | Status: AC
  Administered 2020-06-25: 5 mg via INTRAVENOUS
  Filled 2020-06-25: qty 1

## 2020-06-25 MED ORDER — MAGNESIUM SULFATE 2 GM/50ML IV SOLN
2.0000 g | Freq: Once | INTRAVENOUS | Status: AC
  Administered 2020-06-25: 2 g via INTRAVENOUS
  Filled 2020-06-25: qty 50

## 2020-06-25 MED ORDER — HALOPERIDOL LACTATE 5 MG/ML IJ SOLN
2.0000 mg | Freq: Four times a day (QID) | INTRAMUSCULAR | Status: DC | PRN
  Administered 2020-06-25: 2 mg via INTRAVENOUS
  Filled 2020-06-25: qty 1

## 2020-06-25 MED ORDER — POTASSIUM CHLORIDE CRYS ER 20 MEQ PO TBCR
40.0000 meq | EXTENDED_RELEASE_TABLET | Freq: Once | ORAL | Status: AC
  Administered 2020-06-25: 40 meq via ORAL
  Filled 2020-06-25: qty 4

## 2020-06-25 NOTE — Progress Notes (Signed)
[facility] PROGRESS NOTE   Ruben [name] UYQ:[id number] DOB: 11/09/1950 DOA: 06/22/2020  PCP: [name], MD  Brief History/Interval Summary: 70 y.o. male with medical history significant for alcoholism, peptic ulcer disease, hypertension, insomnia, and pulmonary nodule, presented to the emergency department for evaluation of palpitations and general malaise.  Patient reports occasional palpitations, had been told in the past that it was related to his excessive alcohol use, and now presents with recurrent palpitations while trying to cut back on alcohol intake.  Patient states that he typically has 18 shots of vodka daily but developed recurrent palpitations over the last couple days and so he has been trying to cut back.   Consultants: None  Procedures: None  Antibiotics: Anti-infectives (From admission, onward)   None      Subjective/Interval History: Overnight events noted.  Patient noted to be agitated earlier this morning controlled with Haldol.  Patient noted to be confused this morning.  Unable to answer questions appropriately.    Assessment/Plan:  Alcohol withdrawal Patient was placed on CIWA protocol.  Withdrawal symptoms were thought to be improving yesterday but appears to have worsened overnight.  Noted to be more agitated this morning and more tachycardic.  Continue Ativan per CIWA protocol.  Haldol as needed as well.  Continue thiamine multivitamins and folic acid.  May need to transfer to stepdown unit if symptoms continue to worsen.  Palpitations Likely due to alcohol use.  Has a longstanding history of same.  Continue metoprolol. Thyroid function tests were normal.  EKG shows sinus tachycardia.  Metabolic acidosis Most likely due to alcoholic ketoacidosis.  Improved with hydration.  Sodium bicarbonate was discontinued. Urine drug screen was unremarkable.  Blood toxicology screen was also unremarkable except for elevation in alcohol level.  Mild  acute kidney injury Creatinine was 1.44 at admission.  Now appears to be back to baseline.  Cut back on IV fluids.  Oral intake seems to be improving as well.  Monitor urine output.  Essential hypertension Continue amlodipine and metoprolol.  Blood pressure is reasonably well controlled  Thrombocytopenia Most likely due to alcohol-related suppression.  Platelet counts are low but stable.  No evidence of bleeding.  Lovenox was discontinued.    Hyponatremia and hypomagnesemia Continue to monitor and replace electrolytes.    Transaminitis Mild elevation in liver function tests due to alcoholism.  Fatty liver noted on CT scan.  Had negative viral hepatitis testing recently.  Incidental findings on CT angiogram chest 5 mm noncalcified right upper lobe lung nodule, stable since December 27, 2016 and likely benign.  Moderate-sized hiatal hernia.  Outpatient follow-up  DVT Prophylaxis:   SCDs. Code Status: Full code Family Communication: Discussed with the patient Disposition Plan: Hopefully home when improved  Status is: Inpatient  Remains inpatient appropriate because:IV treatments appropriate due to intensity of illness or inability to take PO and Inpatient level of care appropriate due to severity of illness   Dispo: The patient is from: Home              Anticipated d/c is to: Home              Patient currently is not medically stable to d/c.   Difficult to place patient No      Medications:  Scheduled: . amLODipine  5 mg Oral Daily  . atorvastatin  20 mg Oral Daily  . folic acid  1 mg Oral Daily  . LORazepam  0-4 mg Intravenous Q12H  . metoprolol  tartrate  50 mg Oral BID  . multivitamin with minerals  1 tablet Oral Daily  . thiamine  100 mg Oral Daily   Or  . thiamine  100 mg Intravenous Daily   Continuous:  [id number] **OR** acetaminophen, haloperidol lactate, LORazepam **OR** LORazepam, ondansetron **OR** ondansetron (ZOFRAN) IV, polyethylene  glycol   Objective:  Vital Signs  Vitals:   06/24/20 1308 06/24/20 2132 06/25/20 0510 06/25/20 0905  BP: 122/63 126/88 129/80 130/88  Pulse: 73 (!) 110 (!) 101 (!) 103  Resp: 17  18 20   Temp: 98.1 F (36.7 C) 98.2 F (36.8 C) 98 F (36.7 C) 97.8 F (36.6 C)  TempSrc: Oral Oral Oral Oral  SpO2: 98% 96% 97% 98%  Weight:      Height:        Intake/Output Summary (Last 24 hours) at 06/25/2020 1036 Last data filed at 06/24/2020 1656 Gross per 24 hour  Intake 472 ml  Output --  Net 472 ml   Filed Weights   06/24/20 0610  Weight: 89 kg    General appearance: Patient is awake alert confused and mildly agitated.  Tremulous. Resp: Clear to auscultation bilaterally.  Normal effort Cardio: S1-S2 is normal regular.  No S3-S4.  No rubs murmurs or bruit GI: Abdomen is soft.  Nontender nondistended.  Bowel sounds are present normal.  No masses organomegaly Extremities: No edema.  Full range of motion of lower extremities. Neurologic: Disoriented.  No focal deficits noted.     Lab Results:  Data Reviewed: I have personally reviewed following labs and imaging studies  CBC: Recent Labs  Lab 06/22/20 1538 06/23/20 0530 06/24/20 0339 06/25/20 0357  WBC 9.8 10.9* 4.9 5.2  HGB 16.8 14.2 13.2 13.9  HCT 51.3 43.3 38.8* 40.6  MCV 89.2 90.0 88.2 86.8  PLT 178 116* 90* 86*    Basic Metabolic Panel: Recent Labs  Lab 06/22/20 1538 06/22/20 2200 06/23/20 0530 06/24/20 0339 06/25/20 0357  NA 138 135 134* 137 134*  K 4.2 4.5 4.6 3.2* 3.5  CL 99 101 102 106 102  CO2 12* 10* 11* 25 22  GLUCOSE 76 81 124* 127* 138*  BUN 22 18 19 20 14   CREATININE 1.33* 1.44* 1.15 1.06 0.80  CALCIUM 8.9 7.9* 7.7* 8.2* 8.8*  MG  --   --  1.6*  --  1.7  PHOS  --   --  3.0  --   --     GFR: Estimated Creatinine Clearance: 94.1 mL/min (by C-G formula based on SCr of 0.8 mg/dL).  Liver Function Tests: Recent Labs  Lab 06/22/20 1538 06/22/20 2200 06/23/20 0530 06/24/20 0339  06/25/20 0357  AST 64* 64* 37 25 36  ALT 65* 47* 45* 33 36  ALKPHOS 86 73 66 52 58  BILITOT 1.3* 1.1 1.4* 1.7* 1.0  PROT 8.4* 6.6 6.4* 5.6* 6.4*  ALBUMIN 4.8 3.7 3.6 3.1* 3.5    Recent Labs  Lab 06/22/20 1538  LIPASE 26    Thyroid Function Tests: Recent Labs    06/22/20 1538  TSH 1.295  FREET4 0.82    Recent Results (from the past 240 hour(s))  Resp Panel by RT-PCR (Flu A&B, Covid) Nasopharyngeal Swab     Status: None   Collection Time: 06/22/20  3:38 PM   Specimen: Nasopharyngeal Swab; Nasopharyngeal(NP) swabs in vial transport medium  Result Value Ref Range Status   SARS Coronavirus 2 by RT PCR NEGATIVE NEGATIVE Final    Comment: (NOTE) SARS-CoV-2 target nucleic acids are  NOT DETECTED.  The SARS-CoV-2 RNA is generally detectable in upper respiratory specimens during the acute phase of infection. The lowest concentration of SARS-CoV-2 viral copies this assay can detect is 138 copies/mL. A negative result does not preclude SARS-Cov-2 infection and should not be used as the sole basis for treatment or other patient management decisions. A negative result may occur with  improper specimen collection/handling, submission of specimen other than nasopharyngeal swab, presence of viral mutation(s) within the areas targeted by this assay, and inadequate number of viral copies(<138 copies/mL). A negative result must be combined with clinical observations, patient history, and epidemiological information. The expected result is Negative.  Fact Sheet for Patients:  EntrepreneurPulse.com.au  Fact Sheet for Healthcare Providers:  IncredibleEmployment.be  This test is no t yet approved or cleared by the [place] FDA and  has been authorized for detection and/or diagnosis of SARS-CoV-2 by FDA under an Emergency Use Authorization (EUA). This EUA will remain  in effect (meaning this test can be used) for the duration of the COVID-19  declaration under Section 564(b)(1) of the Act, 21 U.S.C.section 360bbb-3(b)(1), unless the authorization is terminated  or revoked sooner.       Influenza A by PCR NEGATIVE NEGATIVE Final   Influenza B by PCR NEGATIVE NEGATIVE Final    Comment: (NOTE) The Xpert Xpress SARS-CoV-2/FLU/RSV plus assay is intended as an aid in the diagnosis of influenza from Nasopharyngeal swab specimens and should not be used as a sole basis for treatment. Nasal washings and aspirates are unacceptable for Xpert Xpress SARS-CoV-2/FLU/RSV testing.  Fact Sheet for Patients: EntrepreneurPulse.com.au  Fact Sheet for Healthcare Providers: IncredibleEmployment.be  This test is not yet approved or cleared by the [place] FDA and has been authorized for detection and/or diagnosis of SARS-CoV-2 by FDA under an Emergency Use Authorization (EUA). This EUA will remain in effect (meaning this test can be used) for the duration of the COVID-19 declaration under Section 564(b)(1) of the Act, 21 U.S.C. section 360bbb-3(b)(1), unless the authorization is terminated or revoked.  Performed at [facility], [place] [address] [place], [place] [address]       Radiology Studies: No results found.     LOS: 3 days   Delois Silvester [employer] on [email or web address]  06/25/2020, 10:36 AM

## 2020-06-25 NOTE — Progress Notes (Signed)
Pt became increasingly more agitated and combative. Pt tried to leave the room saying he was going home. Attempted to re-orient pt and PRN Ativan given with no effect. MD notified. Security called to room to get pt back to bed. Pt back in bed and is asking for a beer. New order for Haldol given. Pt now sleeping. Bed alarm is on.

## 2020-06-25 NOTE — Care Management Important Message (Signed)
Important Message  Patient Details IM Letter given to the Patient. Name: Ruben [name] MRN: [id number] Date of Birth: 1950/09/08   Medicare Important Message Given:  Yes     Kerin Salen 06/25/2020, 9:20 AM

## 2020-06-26 LAB — COMPREHENSIVE METABOLIC PANEL
ALT: 56 U/L — ABNORMAL HIGH (ref 0–44)
AST: 55 U/L — ABNORMAL HIGH (ref 15–41)
Albumin: 3.6 g/dL (ref 3.5–5.0)
Alkaline Phosphatase: 59 U/L (ref 38–126)
Anion gap: 10 (ref 5–15)
BUN: 12 mg/dL (ref 8–23)
CO2: 27 mmol/L (ref 22–32)
Calcium: 9.1 mg/dL (ref 8.9–10.3)
Chloride: 100 mmol/L (ref 98–111)
Creatinine, Ser: 1.04 mg/dL (ref 0.61–1.24)
GFR, Estimated: >60 mL/min (ref >60)
Glucose, Bld: 99 mg/dL (ref 70–99)
Potassium: 3.9 mmol/L (ref 3.5–5.1)
Sodium: 137 mmol/L (ref 135–145)
Total Bilirubin: 0.8 mg/dL (ref 0.3–1.2)
Total Protein: 6.6 g/dL (ref 6.5–8.1)

## 2020-06-26 LAB — CBC
HCT: 43.6 % (ref 39.0–52.0)
Hemoglobin: 14.7 g/dL (ref 13.0–17.0)
MCH: 29.5 pg (ref 26.0–34.0)
MCHC: 33.7 g/dL (ref 30.0–36.0)
MCV: 87.4 fL (ref 80.0–100.0)
Platelets: 105 10*3/uL — ABNORMAL LOW (ref 150–400)
RBC: 4.99 MIL/uL (ref 4.22–5.81)
RDW: 14.2 % (ref 11.5–15.5)
WBC: 5.5 10*3/uL (ref 4.0–10.5)
nRBC: 0 % (ref 0.0–0.2)

## 2020-06-26 MED ORDER — LORAZEPAM 1 MG PO TABS
1.0000 mg | ORAL_TABLET | ORAL | Status: DC | PRN
Start: 2020-06-26 — End: 2020-06-28
  Administered 2020-06-27 – 2020-06-28 (×2): 1 mg via ORAL
  Filled 2020-06-26 (×2): qty 1

## 2020-06-26 MED ORDER — LORAZEPAM 2 MG/ML IJ SOLN
1.0000 mg | INTRAMUSCULAR | Status: DC | PRN
  Administered 2020-06-26 – 2020-06-27 (×3): 2 mg via INTRAVENOUS
  Filled 2020-06-26 (×3): qty 1

## 2020-06-26 NOTE — Progress Notes (Signed)
[facility] PROGRESS NOTE   Ruben [name] IRS:[id number] DOB: 06/10/1950 DOA: 06/22/2020  PCP: [name], MD  Brief History/Interval Summary: 70 y.o. male with medical history significant for alcoholism, peptic ulcer disease, hypertension, insomnia, and pulmonary nodule, presented to the emergency department for evaluation of palpitations and general malaise.  Patient reports occasional palpitations, had been told in the past that it was related to his excessive alcohol use, and now presents with recurrent palpitations while trying to cut back on alcohol intake.  Patient states that he typically has 18 shots of vodka daily but developed recurrent palpitations over the last couple days and so he has been trying to cut back.   Consultants: None  Procedures: None  Antibiotics: Anti-infectives (From admission, onward)   None      Subjective/Interval History: Patient apparently slept well overnight.  No significant episodes of agitation noted by nursing staff.  Sleepy this morning but easily arousable.  States that he is feeling better.  No other complaints offered.      Assessment/Plan:  Alcohol withdrawal Patient was placed on CIWA protocol.  Patient had worsening withdrawal about 48 hours ago.  Required multiple doses of Ativan and Haldol.  Last 12 hours appears to have gone better.  Continue CIWA protocol.  Continue thiamine multivitamin and folic acid.  May need sitter if he has episodes of agitation not easily controlled with medications.  Palpitations Likely due to alcohol use.  Has a longstanding history of same.  Continue metoprolol. Thyroid function tests were normal.  EKG shows sinus tachycardia.  Metabolic acidosis Most likely due to alcoholic ketoacidosis.  Improved with hydration.  Sodium bicarbonate was discontinued. Urine drug screen was unremarkable.  Blood toxicology screen was also unremarkable except for elevation in alcohol level.  Mild acute kidney  injury Creatinine was 1.44 at admission.  Back to baseline with IV fluids.  Encourage oral intake.  Monitor urine output.    Essential hypertension Continue amlodipine and metoprolol.  Blood pressure is reasonably well controlled  Thrombocytopenia Most likely due to alcohol-related suppression.  Platelet counts are low but stable.  No evidence of bleeding.  Lovenox was discontinued.    Hyponatremia and hypomagnesemia Continue to monitor and replace electrolytes.  Sodium level is back to normal.  Transaminitis Mild elevation in liver function tests due to alcoholism.  Fatty liver noted on CT scan.  Had negative viral hepatitis testing recently.  Incidental findings on CT angiogram chest 5 mm noncalcified right upper lobe lung nodule, stable since December 27, 2016 and likely benign.  Moderate-sized hiatal hernia.  Outpatient follow-up  DVT Prophylaxis:   SCDs. Code Status: Full code Family Communication: Discussed with the patient Disposition Plan: Hopefully home when improved  Status is: Inpatient  Remains inpatient appropriate because:IV treatments appropriate due to intensity of illness or inability to take PO and Inpatient level of care appropriate due to severity of illness   Dispo: The patient is from: Home              Anticipated d/c is to: Home              Patient currently is not medically stable to d/c.   Difficult to place patient No      Medications:  Scheduled: . amLODipine  5 mg Oral Daily  . atorvastatin  20 mg Oral Daily  . folic acid  1 mg Oral Daily  . LORazepam  0-4 mg Intravenous Q12H  . metoprolol tartrate  50 mg  Oral BID  . multivitamin with minerals  1 tablet Oral Daily  . thiamine  100 mg Oral Daily   Or  . thiamine  100 mg Intravenous Daily   Continuous:  INO:[id number] **OR** acetaminophen, haloperidol lactate, ondansetron **OR** ondansetron (ZOFRAN) IV, polyethylene glycol   Objective:  Vital Signs  Vitals:   06/25/20 1300  06/25/20 1949 06/26/20 0014 06/26/20 0533  BP:  122/82  121/63  Pulse: 75 91 76 79  Resp:  16  16  Temp:  98.1 F (36.7 C)  98.2 F (36.8 C)  TempSrc:  Oral  Oral  SpO2:  98%  100%  Weight:      Height:        Intake/Output Summary (Last 24 hours) at 06/26/2020 1200 Last data filed at 06/26/2020 1120 Gross per 24 hour  Intake 1070 ml  Output 375 ml  Net 695 ml   Filed Weights   06/24/20 0610  Weight: 89 kg    General appearance: Somnolent but easily arousable.  In no distress. Resp: Clear to auscultation bilaterally.  Normal effort Cardio: S1-S2 is normal regular.  No S3-S4.  No rubs murmurs or bruit GI: Abdomen is soft.  Nontender nondistended.  Bowel sounds are present normal.  No masses organomegaly Extremities: No edema.  Full range of motion of lower extremities. Neurologic: Mildly disoriented.  Mildly tremulous.  No focal neurological deficits.      Lab Results:  Data Reviewed: I have personally reviewed following labs and imaging studies  CBC: Recent Labs  Lab 06/22/20 1538 06/23/20 0530 06/24/20 0339 06/25/20 0357 06/26/20 0342  WBC 9.8 10.9* 4.9 5.2 5.5  HGB 16.8 14.2 13.2 13.9 14.7  HCT 51.3 43.3 38.8* 40.6 43.6  MCV 89.2 90.0 88.2 86.8 87.4  PLT 178 116* 90* 86* 105*    Basic Metabolic Panel: Recent Labs  Lab 06/22/20 2200 06/23/20 0530 06/24/20 0339 06/25/20 0357 06/26/20 0655  NA 135 134* 137 134* 137  K 4.5 4.6 3.2* 3.5 3.9  CL 101 102 106 102 100  CO2 10* 11* 25 22 27   GLUCOSE 81 124* 127* 138* 99  BUN 18 19 20 14 12   CREATININE 1.44* 1.15 1.06 0.80 1.04  CALCIUM 7.9* 7.7* 8.2* 8.8* 9.1  MG  --  1.6*  --  1.7  --   PHOS  --  3.0  --   --   --     GFR: Estimated Creatinine Clearance: 72.4 mL/min (by C-G formula based on SCr of 1.04 mg/dL).  Liver Function Tests: Recent Labs  Lab 06/22/20 2200 06/23/20 0530 06/24/20 0339 06/25/20 0357 06/26/20 0655  AST 64* 37 25 36 55*  ALT 47* 45* 33 36 56*  ALKPHOS 73 66 52 58 59   BILITOT 1.1 1.4* 1.7* 1.0 0.8  PROT 6.6 6.4* 5.6* 6.4* 6.6  ALBUMIN 3.7 3.6 3.1* 3.5 3.6    Recent Labs  Lab 06/22/20 1538  LIPASE 26     Recent Results (from the past 240 hour(s))  Resp Panel by RT-PCR (Flu A&B, Covid) Nasopharyngeal Swab     Status: None   Collection Time: 06/22/20  3:38 PM   Specimen: Nasopharyngeal Swab; Nasopharyngeal(NP) swabs in vial transport medium  Result Value Ref Range Status   SARS Coronavirus 2 by RT PCR NEGATIVE NEGATIVE Final    Comment: (NOTE) SARS-CoV-2 target nucleic acids are NOT DETECTED.  The SARS-CoV-2 RNA is generally detectable in upper respiratory specimens during the acute phase of infection. The lowest concentration of  SARS-CoV-2 viral copies this assay can detect is 138 copies/mL. A negative result does not preclude SARS-Cov-2 infection and should not be used as the sole basis for treatment or other patient management decisions. A negative result may occur with  improper specimen collection/handling, submission of specimen other than nasopharyngeal swab, presence of viral mutation(s) within the areas targeted by this assay, and inadequate number of viral copies(<138 copies/mL). A negative result must be combined with clinical observations, patient history, and epidemiological information. The expected result is Negative.  Fact Sheet for Patients:  EntrepreneurPulse.com.au  Fact Sheet for Healthcare Providers:  IncredibleEmployment.be  This test is no t yet approved or cleared by the [place] FDA and  has been authorized for detection and/or diagnosis of SARS-CoV-2 by FDA under an Emergency Use Authorization (EUA). This EUA will remain  in effect (meaning this test can be used) for the duration of the COVID-19 declaration under Section 564(b)(1) of the Act, 21 U.S.C.section 360bbb-3(b)(1), unless the authorization is terminated  or revoked sooner.       Influenza A by PCR NEGATIVE  NEGATIVE Final   Influenza B by PCR NEGATIVE NEGATIVE Final    Comment: (NOTE) The Xpert Xpress SARS-CoV-2/FLU/RSV plus assay is intended as an aid in the diagnosis of influenza from Nasopharyngeal swab specimens and should not be used as a sole basis for treatment. Nasal washings and aspirates are unacceptable for Xpert Xpress SARS-CoV-2/FLU/RSV testing.  Fact Sheet for Patients: EntrepreneurPulse.com.au  Fact Sheet for Healthcare Providers: IncredibleEmployment.be  This test is not yet approved or cleared by the [place] FDA and has been authorized for detection and/or diagnosis of SARS-CoV-2 by FDA under an Emergency Use Authorization (EUA). This EUA will remain in effect (meaning this test can be used) for the duration of the COVID-19 declaration under Section 564(b)(1) of the Act, 21 U.S.C. section 360bbb-3(b)(1), unless the authorization is terminated or revoked.  Performed at [facility], [place] [address], [place], [place] [address]       Radiology Studies: No results found.     LOS: 4 days   Nikos Anglemyer [employer] on [email or web address]  06/26/2020, 12:00 PM

## 2020-06-26 NOTE — Plan of Care (Signed)
Plan of care reviewed. 

## 2020-06-27 LAB — COMPREHENSIVE METABOLIC PANEL
ALT: 70 U/L — ABNORMAL HIGH (ref 0–44)
AST: 60 U/L — ABNORMAL HIGH (ref 15–41)
Albumin: 3.4 g/dL — ABNORMAL LOW (ref 3.5–5.0)
Alkaline Phosphatase: 57 U/L (ref 38–126)
Anion gap: 11 (ref 5–15)
BUN: 14 mg/dL (ref 8–23)
CO2: 24 mmol/L (ref 22–32)
Calcium: 9 mg/dL (ref 8.9–10.3)
Chloride: 100 mmol/L (ref 98–111)
Creatinine, Ser: 1 mg/dL (ref 0.61–1.24)
GFR, Estimated: >60 mL/min (ref >60)
Glucose, Bld: 110 mg/dL — ABNORMAL HIGH (ref 70–99)
Potassium: 3.7 mmol/L (ref 3.5–5.1)
Sodium: 135 mmol/L (ref 135–145)
Total Bilirubin: 0.9 mg/dL (ref 0.3–1.2)
Total Protein: 6.4 g/dL — ABNORMAL LOW (ref 6.5–8.1)

## 2020-06-27 LAB — CBC
HCT: 44.2 % (ref 39.0–52.0)
Hemoglobin: 15 g/dL (ref 13.0–17.0)
MCH: 29.9 pg (ref 26.0–34.0)
MCHC: 33.9 g/dL (ref 30.0–36.0)
MCV: 88 fL (ref 80.0–100.0)
Platelets: 127 10*3/uL — ABNORMAL LOW (ref 150–400)
RBC: 5.02 MIL/uL (ref 4.22–5.81)
RDW: 14 % (ref 11.5–15.5)
WBC: 6.1 10*3/uL (ref 4.0–10.5)
nRBC: 0 % (ref 0.0–0.2)

## 2020-06-27 LAB — MAGNESIUM: Magnesium: 1.9 mg/dL (ref 1.7–2.4)

## 2020-06-27 NOTE — Progress Notes (Addendum)
   06/27/20 1545  OTHER  Substance Abuse Education Offered Yes  Substance abuse interventions Patient Counseling  (CAGE-AID) Substance Abuse Screening Tool  Have You Ever Felt You Ought to Cut Down on Your Drinking or Drug Use? 1  Have People Annoyed You By Critizing Your Drinking Or Drug Use? 1  Have You Felt Bad Or Guilty About Your Drinking Or Drug Use? 0  Have You Ever Had a Drink or Used Drugs First Thing In The Morning to Steady Your Nerves or to Get Rid of a Hangover? 0  CAGE-AID Score 2    TOC CM provided pt with resources on substance/ETOH abuse. States he has been to rehab in the past. Plans to stay with his cousin until he is a little stable in [place]. [place], [place] [facility] TOC CM [phone]

## 2020-06-27 NOTE — Progress Notes (Signed)
   06/27/20 1548  Important Message  Medicare important message given? Yes

## 2020-06-27 NOTE — Progress Notes (Signed)
[facility] PROGRESS NOTE   Ruben [name] IRS:[id number] DOB: 1950/09/28 DOA: 06/22/2020  PCP: [name], MD  Brief History/Interval Summary: 70 y.o. male with medical history significant for alcoholism, peptic ulcer disease, hypertension, insomnia, and pulmonary nodule, presented to the emergency department for evaluation of palpitations and general malaise.  Patient reports occasional palpitations, had been told in the past that it was related to his excessive alcohol use, and now presents with recurrent palpitations while trying to cut back on alcohol intake.  Patient states that he typically has 18 shots of vodka daily but developed recurrent palpitations over the last couple days and so he has been trying to cut back.   Consultants: None  Procedures: None  Antibiotics: Anti-infectives (From admission, onward)   None      Subjective/Interval History: Per nursing staff patient had a reasonably good day yesterday.  No episodes of agitation.  Patient mentions that he feels better this morning.  Slept well.      Assessment/Plan:  Alcohol withdrawal/alcohol use disorder Patient was placed on CIWA protocol.  Patient had worsening symptoms about 3 days ago.  Continued on CIWA protocol.  Has done better in the last 24 hours.  Continue with thiamine multivitamin and folic acid.  Mobilize.  Social worker to counsel and provide outpatient resources for rehab.  Palpitations Likely due to alcohol use.  Has a longstanding history of same.  Continue metoprolol. Thyroid function tests were normal.  EKG shows sinus tachycardia.  Metabolic acidosis Most likely due to alcoholic ketoacidosis.  Improved with hydration.  Sodium bicarbonate was discontinued. Urine drug screen was unremarkable.  Blood toxicology screen was also unremarkable except for elevation in alcohol level.  Mild acute kidney injury Creatinine was 1.44 at admission.  Back to baseline with IV fluids.  Encourage  oral intake.  Monitor urine output.    Essential hypertension Continue amlodipine and metoprolol.  Blood pressure is reasonably well controlled  Thrombocytopenia Most likely due to alcohol-related suppression.  Platelet counts are low but stable.  No evidence of bleeding.  Lovenox was discontinued.    Hyponatremia and hypomagnesemia Continue to monitor and replace electrolytes.  Sodium level is back to normal.  Transaminitis Mild elevation in liver function tests due to alcoholism.  Fatty liver noted on CT scan.  Had negative viral hepatitis testing recently.  Incidental findings on CT angiogram chest 5 mm noncalcified right upper lobe lung nodule, stable since December 27, 2016 and likely benign.  Moderate-sized hiatal hernia.  Outpatient follow-up  DVT Prophylaxis:   SCDs. Code Status: Full code Family Communication: Discussed with the patient Disposition Plan: Hopefully home when improved  Status is: Inpatient  Remains inpatient appropriate because:IV treatments appropriate due to intensity of illness or inability to take PO and Inpatient level of care appropriate due to severity of illness   Dispo: The patient is from: Home              Anticipated d/c is to: Home              Patient currently is not medically stable to d/c.   Difficult to place patient No      Medications:  Scheduled: . amLODipine  5 mg Oral Daily  . atorvastatin  20 mg Oral Daily  . folic acid  1 mg Oral Daily  . metoprolol tartrate  50 mg Oral BID  . multivitamin with minerals  1 tablet Oral Daily  . thiamine  100 mg Oral Daily  Or  . thiamine  100 mg Intravenous Daily   Continuous:  [id number] **OR** acetaminophen, haloperidol lactate, LORazepam **OR** LORazepam, ondansetron **OR** ondansetron (ZOFRAN) IV, polyethylene glycol   Objective:  Vital Signs  Vitals:   06/26/20 0533 06/26/20 1327 06/26/20 2107 06/27/20 0547  BP: 121/63 130/84 126/84 (!) 119/93  Pulse: 79 80 (!)  104 78  Resp: 16 17 20 19   Temp: 98.2 F (36.8 C) 98.6 F (37 C) 97.7 F (36.5 C) 97.8 F (36.6 C)  TempSrc: Oral  Oral Oral  SpO2: 100% 97% 96% 97%  Weight:      Height:        Intake/Output Summary (Last 24 hours) at 06/27/2020 1013 Last data filed at 06/27/2020 0837 Gross per 24 hour  Intake 920 ml  Output 3475 ml  Net -2555 ml   Filed Weights   06/24/20 0610  Weight: 89 kg    General appearance: Awake alert.  In no distress Resp: Clear to auscultation bilaterally.  Normal effort Cardio: S1-S2 is normal regular.  No S3-S4.  No rubs murmurs or bruit GI: Abdomen is soft.  Nontender nondistended.  Bowel sounds are present normal.  No masses organomegaly Extremities: No edema.  Full range of motion of lower extremities. Neurologic: Alert and oriented x3.  No focal neurological deficits.      Lab Results:  Data Reviewed: I have personally reviewed following labs and imaging studies  CBC: Recent Labs  Lab 06/23/20 0530 06/24/20 0339 06/25/20 0357 06/26/20 0342 06/27/20 0304  WBC 10.9* 4.9 5.2 5.5 6.1  HGB 14.2 13.2 13.9 14.7 15.0  HCT 43.3 38.8* 40.6 43.6 44.2  MCV 90.0 88.2 86.8 87.4 88.0  PLT 116* 90* 86* 105* 127*    Basic Metabolic Panel: Recent Labs  Lab 06/23/20 0530 06/24/20 0339 06/25/20 0357 06/26/20 0655 06/27/20 0304  NA 134* 137 134* 137 135  K 4.6 3.2* 3.5 3.9 3.7  CL 102 106 102 100 100  CO2 11* 25 22 27 24   GLUCOSE 124* 127* 138* 99 110*  BUN 19 20 14 12 14   CREATININE 1.15 1.06 0.80 1.04 1.00  CALCIUM 7.7* 8.2* 8.8* 9.1 9.0  MG 1.6*  --  1.7  --  1.9  PHOS 3.0  --   --   --   --     GFR: Estimated Creatinine Clearance: 75.3 mL/min (by C-G formula based on SCr of 1 mg/dL).  Liver Function Tests: Recent Labs  Lab 06/23/20 0530 06/24/20 0339 06/25/20 0357 06/26/20 0655 06/27/20 0304  AST 37 25 36 55* 60*  ALT 45* 33 36 56* 70*  ALKPHOS 66 52 58 59 57  BILITOT 1.4* 1.7* 1.0 0.8 0.9  PROT 6.4* 5.6* 6.4* 6.6 6.4*  ALBUMIN  3.6 3.1* 3.5 3.6 3.4*    Recent Labs  Lab 06/22/20 1538  LIPASE 26     Recent Results (from the past 240 hour(s))  Resp Panel by RT-PCR (Flu A&B, Covid) Nasopharyngeal Swab     Status: None   Collection Time: 06/22/20  3:38 PM   Specimen: Nasopharyngeal Swab; Nasopharyngeal(NP) swabs in vial transport medium  Result Value Ref Range Status   SARS Coronavirus 2 by RT PCR NEGATIVE NEGATIVE Final    Comment: (NOTE) SARS-CoV-2 target nucleic acids are NOT DETECTED.  The SARS-CoV-2 RNA is generally detectable in upper respiratory specimens during the acute phase of infection. The lowest concentration of SARS-CoV-2 viral copies this assay can detect is 138 copies/mL. A negative result does not preclude  SARS-Cov-2 infection and should not be used as the sole basis for treatment or other patient management decisions. A negative result may occur with  improper specimen collection/handling, submission of specimen other than nasopharyngeal swab, presence of viral mutation(s) within the areas targeted by this assay, and inadequate number of viral copies(<138 copies/mL). A negative result must be combined with clinical observations, patient history, and epidemiological information. The expected result is Negative.  Fact Sheet for Patients:  EntrepreneurPulse.com.au  Fact Sheet for Healthcare Providers:  IncredibleEmployment.be  This test is no t yet approved or cleared by the [place] FDA and  has been authorized for detection and/or diagnosis of SARS-CoV-2 by FDA under an Emergency Use Authorization (EUA). This EUA will remain  in effect (meaning this test can be used) for the duration of the COVID-19 declaration under Section 564(b)(1) of the Act, 21 U.S.C.section 360bbb-3(b)(1), unless the authorization is terminated  or revoked sooner.       Influenza A by PCR NEGATIVE NEGATIVE Final   Influenza B by PCR NEGATIVE NEGATIVE Final     Comment: (NOTE) The Xpert Xpress SARS-CoV-2/FLU/RSV plus assay is intended as an aid in the diagnosis of influenza from Nasopharyngeal swab specimens and should not be used as a sole basis for treatment. Nasal washings and aspirates are unacceptable for Xpert Xpress SARS-CoV-2/FLU/RSV testing.  Fact Sheet for Patients: EntrepreneurPulse.com.au  Fact Sheet for Healthcare Providers: IncredibleEmployment.be  This test is not yet approved or cleared by the [place] FDA and has been authorized for detection and/or diagnosis of SARS-CoV-2 by FDA under an Emergency Use Authorization (EUA). This EUA will remain in effect (meaning this test can be used) for the duration of the COVID-19 declaration under Section 564(b)(1) of the Act, 21 U.S.C. section 360bbb-3(b)(1), unless the authorization is terminated or revoked.  Performed at [facility], [place] [address], [place], [place] [address]       Radiology Studies: No results found.     LOS: 5 days   [employer] on [email or web address]  06/27/2020, 10:13 AM

## 2020-06-28 LAB — COMPREHENSIVE METABOLIC PANEL
ALT: 76 U/L — ABNORMAL HIGH (ref 0–44)
AST: 49 U/L — ABNORMAL HIGH (ref 15–41)
Albumin: 3.6 g/dL (ref 3.5–5.0)
Alkaline Phosphatase: 59 U/L (ref 38–126)
Anion gap: 10 (ref 5–15)
BUN: 17 mg/dL (ref 8–23)
CO2: 22 mmol/L (ref 22–32)
Calcium: 8.9 mg/dL (ref 8.9–10.3)
Chloride: 100 mmol/L (ref 98–111)
Creatinine, Ser: 1.17 mg/dL (ref 0.61–1.24)
GFR, Estimated: >60 mL/min (ref >60)
Glucose, Bld: 123 mg/dL — ABNORMAL HIGH (ref 70–99)
Potassium: 3.4 mmol/L — ABNORMAL LOW (ref 3.5–5.1)
Sodium: 132 mmol/L — ABNORMAL LOW (ref 135–145)
Total Bilirubin: 0.7 mg/dL (ref 0.3–1.2)
Total Protein: 6.5 g/dL (ref 6.5–8.1)

## 2020-06-28 LAB — CBC
HCT: 44.4 % (ref 39.0–52.0)
Hemoglobin: 15.1 g/dL (ref 13.0–17.0)
MCH: 29.8 pg (ref 26.0–34.0)
MCHC: 34 g/dL (ref 30.0–36.0)
MCV: 87.7 fL (ref 80.0–100.0)
Platelets: 150 10*3/uL (ref 150–400)
RBC: 5.06 MIL/uL (ref 4.22–5.81)
RDW: 14.1 % (ref 11.5–15.5)
WBC: 7.4 10*3/uL (ref 4.0–10.5)
nRBC: 0 % (ref 0.0–0.2)

## 2020-06-28 MED ORDER — POTASSIUM CHLORIDE CRYS ER 20 MEQ PO TBCR
40.0000 meq | EXTENDED_RELEASE_TABLET | Freq: Once | ORAL | Status: AC
  Administered 2020-06-28: 40 meq via ORAL
  Filled 2020-06-28: qty 2

## 2020-06-28 MED ORDER — CHLORDIAZEPOXIDE HCL 25 MG PO CAPS: ORAL_CAPSULE | ORAL | 0 refills | Status: DC

## 2020-06-28 MED ORDER — FOLIC ACID 1 MG PO TABS: 1.0000 mg | ORAL_TABLET | Freq: Every day | ORAL | 0 refills | Status: DC

## 2020-06-28 MED ORDER — THIAMINE HCL 100 MG PO TABS: 100.0000 mg | ORAL_TABLET | Freq: Every day | ORAL | 0 refills | Status: DC

## 2020-06-28 NOTE — Plan of Care (Signed)

## 2020-06-28 NOTE — Progress Notes (Signed)
Patient ambulated on the hallway around the unit for 3 rounds, with and without walker. Patient requested for PRN ativan for sleep and anxiety.

## 2020-06-28 NOTE — Discharge Summary (Signed)
[facility]  Physician Discharge Summary   Patient ID: Ruben [name] MRN: [id number] DOB/AGE: October 13, 1950 70 y.o.  Admit date: 06/22/2020 Discharge date: 06/29/2020  PCP: [name], MD  DISCHARGE DIAGNOSES:  Alcohol withdrawal syndrome, improved Alcohol use disorder, counseled Essential hypertension Mild alcoholic hepatitis  RECOMMENDATIONS FOR OUTPATIENT FOLLOW UP: 1. Outpatient surveillance for the right upper lobe lung nodule    Home Health: None Equipment/Devices: None  CODE STATUS: Full code  DISCHARGE CONDITION: fair  Diet recommendation: As before  INITIAL HISTORY: 69 y.o.malewith medical history significant foralcoholism, peptic ulcer disease, hypertension, insomnia, and pulmonary nodule, presented to the emergency department for evaluation of palpitations and general malaise. Patient reports occasional palpitations, had been told in the past that it was related to his excessive alcohol use, and now presents with recurrent palpitations while trying to cut back on alcohol intake. Patient states that he typically has 18 shots of vodka daily but developed recurrent palpitations over the last couple days and so he has been trying to cut back.    HOSPITAL COURSE:    Alcohol withdrawal/alcohol use disorder Patient was placed on CIWA protocol.    He was also given thiamine multivitamin and folic acid.  Patient started improving.  Doing better in the last 24 to 48 hours.  Has been ambulating without difficulty.  Withdrawal symptoms have almost resolved.  Will be discharged with Librium.  Seen by Education officer, museum as well.  Palpitations Likely due to alcohol use.  Has a longstanding history of same.  Continue metoprolol. Thyroid function tests were normal.  EKG shows sinus tachycardia.  Metabolic acidosis Most likely due to alcoholic ketoacidosis.  Improved with hydration.  Sodium bicarbonate was discontinued. Urine drug screen was unremarkable.  Blood  toxicology screen was also unremarkable except for elevation in alcohol level.  Mild acute kidney injury Creatinine was 1.44 at admission.  Back to baseline with IV fluids.    Essential hypertension Continue amlodipine and metoprolol.    Thrombocytopenia Most likely due to alcohol-related suppression.  Platelet counts are low but stable.  No evidence of bleeding.   Hyponatremia and hypomagnesemia Continue to monitor and replace electrolytes.  Sodium level is back to normal.  Transaminitis Mild elevation in liver function tests due to alcoholism.  Fatty liver noted on CT scan.  Had negative viral hepatitis testing recently.  Incidental findings on CT angiogram chest 5 mm noncalcified right upper lobe lung nodule, stable since December 27, 2016 and likely benign.  Moderate-sized hiatal hernia.  Outpatient follow-up   Patient is stable.  Okay for discharge home today.   PERTINENT LABS:  The results of significant diagnostics from this hospitalization (including imaging, microbiology, ancillary and laboratory) are listed below for reference.    Microbiology: Recent Results (from the past 240 hour(s))  Resp Panel by RT-PCR (Flu A&B, Covid) Nasopharyngeal Swab     Status: None   Collection Time: 06/22/20  3:38 PM   Specimen: Nasopharyngeal Swab; Nasopharyngeal(NP) swabs in vial transport medium  Result Value Ref Range Status   SARS Coronavirus 2 by RT PCR NEGATIVE NEGATIVE Final    Comment: (NOTE) SARS-CoV-2 target nucleic acids are NOT DETECTED.  The SARS-CoV-2 RNA is generally detectable in upper respiratory specimens during the acute phase of infection. The lowest concentration of SARS-CoV-2 viral copies this assay can detect is 138 copies/mL. A negative result does not preclude SARS-Cov-2 infection and should not be used as the sole basis for treatment or other patient management decisions. A negative result  may occur with  improper specimen collection/handling,  submission of specimen other than nasopharyngeal swab, presence of viral mutation(s) within the areas targeted by this assay, and inadequate number of viral copies(<138 copies/mL). A negative result must be combined with clinical observations, patient history, and epidemiological information. The expected result is Negative.  Fact Sheet for Patients:  EntrepreneurPulse.com.au  Fact Sheet for Healthcare Providers:  IncredibleEmployment.be  This test is no t yet approved or cleared by the [place] FDA and  has been authorized for detection and/or diagnosis of SARS-CoV-2 by FDA under an Emergency Use Authorization (EUA). This EUA will remain  in effect (meaning this test can be used) for the duration of the COVID-19 declaration under Section 564(b)(1) of the Act, 21 U.S.C.section 360bbb-3(b)(1), unless the authorization is terminated  or revoked sooner.       Influenza A by PCR NEGATIVE NEGATIVE Final   Influenza B by PCR NEGATIVE NEGATIVE Final    Comment: (NOTE) The Xpert Xpress SARS-CoV-2/FLU/RSV plus assay is intended as an aid in the diagnosis of influenza from Nasopharyngeal swab specimens and should not be used as a sole basis for treatment. Nasal washings and aspirates are unacceptable for Xpert Xpress SARS-CoV-2/FLU/RSV testing.  Fact Sheet for Patients: EntrepreneurPulse.com.au  Fact Sheet for Healthcare Providers: IncredibleEmployment.be  This test is not yet approved or cleared by the [place] FDA and has been authorized for detection and/or diagnosis of SARS-CoV-2 by FDA under an Emergency Use Authorization (EUA). This EUA will remain in effect (meaning this test can be used) for the duration of the COVID-19 declaration under Section 564(b)(1) of the Act, 21 U.S.C. section 360bbb-3(b)(1), unless the authorization is terminated or revoked.  Performed at [facility], [place] [address] [place], [place] [address]      Labs:  COVID-19 Labs   Lab Results  Component Value Date   [place] 06/22/2020   [place] NEGATIVE 04/03/2020   [place] NEGATIVE 12/29/2019   [place] NEGATIVE 12/22/2019      Basic Metabolic Panel: Recent Labs  Lab 06/23/20 0530 06/24/20 0339 06/25/20 0357 06/26/20 0655 06/27/20 0304 06/28/20 0317  NA 134* 137 134* 137 135 132*  K 4.6 3.2* 3.5 3.9 3.7 3.4*  CL 102 106 102 100 100 100  CO2 11* 25 22 27 24 22   GLUCOSE 124* 127* 138* 99 110* 123*  BUN 19 20 14 12 14 17   CREATININE 1.15 1.06 0.80 1.04 1.00 1.17  CALCIUM 7.7* 8.2* 8.8* 9.1 9.0 8.9  MG 1.6*  --  1.7  --  1.9  --   PHOS 3.0  --   --   --   --   --    Liver Function Tests: Recent Labs  Lab 06/24/20 0339 06/25/20 0357 06/26/20 0655 06/27/20 0304 06/28/20 0317  AST 25 36 55* 60* 49*  ALT 33 36 56* 70* 76*  ALKPHOS 52 58 59 57 59  BILITOT 1.7* 1.0 0.8 0.9 0.7  PROT 5.6* 6.4* 6.6 6.4* 6.5  ALBUMIN 3.1* 3.5 3.6 3.4* 3.6   Recent Labs  Lab 06/22/20 1538  LIPASE 26   CBC: Recent Labs  Lab 06/24/20 0339 06/25/20 0357 06/26/20 0342 06/27/20 0304 06/28/20 0317  WBC 4.9 5.2 5.5 6.1 7.4  HGB 13.2 13.9 14.7 15.0 15.1  HCT 38.8* 40.6 43.6 44.2 44.4  MCV 88.2 86.8 87.4 88.0 87.7  PLT 90* 86* 105* 127* 150     IMAGING STUDIES CT Angio Chest PE W/Cm &/Or Wo Cm  Result Date: 06/22/2020 CLINICAL DATA:  Shortness of breath. EXAM: CT ANGIOGRAPHY CHEST WITH CONTRAST TECHNIQUE: Multidetector CT imaging of the chest was performed using the standard protocol during bolus administration of intravenous contrast. Multiplanar CT image reconstructions and MIPs were obtained to evaluate the vascular anatomy. CONTRAST:  166mL OMNIPAQUE IOHEXOL 350 MG/ML SOLN COMPARISON:  December 27, 2016 FINDINGS: Cardiovascular: Satisfactory opacification of the pulmonary arteries to the segmental level. No evidence of pulmonary embolism.  Normal heart size. No pericardial effusion. Mediastinum/Nodes: No enlarged mediastinal, hilar, or axillary lymph nodes. Thyroid gland, trachea, and esophagus demonstrate no significant findings. Lungs/Pleura: A stable 5 mm noncalcified lung nodule is seen within the inferior aspect of the right upper lobe (axial CT image 57, CT series number 12). Mild atelectasis is seen within the posteromedial aspect of the right lower lobe. There is no evidence of a pleural effusion or pneumothorax. Upper Abdomen: There is a moderate-sized hiatal hernia. Diffuse fatty infiltration of the liver parenchyma is seen. Musculoskeletal: No chest wall abnormality. No acute or significant osseous findings. Review of the MIP images confirms the above findings. IMPRESSION: 1. No evidence of pulmonary embolism. 2. 5 mm noncalcified right upper lobe lung nodule, stable since December 27, 2016 and likely benign. 3. Moderate-sized hiatal hernia. 4. Fatty liver. Electronically Signed   By: Virgina Norfolk M.D.   On: 06/22/2020 20:16   DG Chest Portable 1 View  Result Date: 06/22/2020 CLINICAL DATA:  Shortness of breath. EXAM: PORTABLE CHEST 1 VIEW COMPARISON:  04/03/2020 FINDINGS: 1610 hours. Low lung volumes. The lungs are clear without focal pneumonia, edema, pneumothorax or pleural effusion. The cardiopericardial silhouette is within normal limits for size. The visualized bony structures of the thorax show no acute abnormality. Telemetry leads overlie the chest. IMPRESSION: No active disease. Electronically Signed   By: Misty Stanley M.D.   On: 06/22/2020 16:25    DISCHARGE EXAMINATION: Vitals:   06/27/20 0547 06/27/20 1435 06/27/20 2005 06/28/20 0526  BP: (!) 119/93 122/78 112/77 127/89  Pulse: 78 71 94 84  Resp: 19 19 16 16   Temp: 97.8 F (36.6 C) 98.1 F (36.7 C) 98.5 F (36.9 C) 98 F (36.7 C)  TempSrc: Oral     SpO2: 97% 97% 96% 100%  Weight:      Height:       General appearance: Awake alert.  In no  distress Resp: Clear to auscultation bilaterally.  Normal effort Cardio: S1-S2 is normal regular.  No S3-S4.  No rubs murmurs or bruit GI: Abdomen is soft.  Nontender nondistended.  Bowel sounds are present normal.  No masses organomegaly    DISPOSITION: Home  Discharge Instructions    Call MD for:  difficulty breathing, headache or visual disturbances   Complete by: As directed    Call MD for:  extreme fatigue   Complete by: As directed    Call MD for:  persistant dizziness or light-headedness   Complete by: As directed    Call MD for:  persistant nausea and vomiting   Complete by: As directed    Call MD for:  severe uncontrolled pain   Complete by: As directed    Call MD for:  temperature >100.4   Complete by: As directed    Diet - low sodium heart healthy   Complete by: As directed    Discharge instructions   Complete by: As directed    Please take your medications as prescribed.  Please stop consuming alcohol.  Follow-up with your primary care  provider.  You were cared for by a hospitalist during your hospital stay. If you have any questions about your discharge medications or the care you received while you were in the hospital after you are discharged, you can call the unit and asked to speak with the hospitalist on call if the hospitalist that took care of you is not available. Once you are discharged, your primary care physician will handle any further medical issues. Please note that NO REFILLS for any discharge medications will be authorized once you are discharged, as it is imperative that you return to your primary care physician (or establish a relationship with a primary care physician if you do not have one) for your aftercare needs so that they can reassess your need for medications and monitor your lab values. If you do not have a primary care physician, you can call [phone] for a physician referral.   Increase activity slowly   Complete by: As directed          Allergies as of 06/28/2020   No Known Allergies     Medication List    STOP taking these medications   clonazePAM 1 MG tablet Commonly known as: KLONOPIN   feeding supplement Liqd   gabapentin 100 MG capsule Commonly known as: Neurontin   melatonin 3 MG Tabs tablet     TAKE these medications   amLODipine 5 MG tablet Commonly known as: NORVASC Take 1 tablet (5 mg total) by mouth daily.   atorvastatin 20 MG tablet Commonly known as: LIPITOR Take 1 tablet (20 mg total) by mouth daily.   chlordiazePOXIDE 25 MG capsule Commonly known as: LIBRIUM Take take 1 tablet 3 times a day for 3 days followed by 1 tablet twice a day for 3 days followed by 1 tablet once a day for 3 days and then stop   doxepin 10 MG capsule Commonly known as: SINEQUAN Take 30 mg by mouth at bedtime.   folic acid 1 MG tablet Commonly known as: FOLVITE Take 1 tablet (1 mg total) by mouth daily.   Magnesium 400 MG Tabs Take 1,600 mg by mouth daily.   magnesium citrate Soln Take 296 mLs (1 Bottle total) by mouth daily as needed for severe constipation.   metoprolol tartrate 25 MG tablet Commonly known as: LOPRESSOR Take 1 tablet (25 mg total) by mouth 2 (two) times daily. What changed: how much to take   multivitamin with minerals Tabs tablet Take 1 tablet by mouth daily.   polyethylene glycol 17 g packet Commonly known as: MIRALAX / GLYCOLAX Take 17 g by mouth 2 (two) times daily as needed for mild constipation.   senna-docusate 8.6-50 MG tablet Commonly known as: Senokot-S Take 1 tablet by mouth 2 (two) times daily as needed for moderate constipation.   thiamine 100 MG tablet Take 1 tablet (100 mg total) by mouth daily.   traZODone 150 MG tablet Commonly known as: DESYREL Take 150 mg by mouth at bedtime.         Follow-up Information    [name], MD. Schedule an appointment as soon as possible for a visit in 1 week(s).   Specialty: Family Medicine Contact  information: [phone] [phone]               TOTAL DISCHARGE TIME: 36 minutes  [place]  [facility] Pager on [email or web address]  06/29/2020, 12:07 PM

## 2020-06-28 NOTE — Discharge Instructions (Signed)
Alcohol Withdrawal Syndrome Alcohol withdrawal syndrome is a group of symptoms that can develop when a person who drinks heavily and regularly stops drinking or drinks less. Alcohol withdrawal syndrome can be mild or severe, and it may even be life-threatening. Alcohol withdrawal syndrome usually affects people who have alcohol use disorder, which may also be called alcoholism. Alcohol use disorder is when a person is unable to control his or her alcohol use, and drinking too much or too often causes problems at home, at work, or in relationships. What are the causes? Drinking heavily and drinking on a regular basis cause changes in brain chemistry. Over time, the body becomes dependent on alcohol. When alcohol use stops, the chemistry system in the brain becomes unbalanced and causes the symptoms of alcohol withdrawal. What increases the risk? Alcohol withdrawal syndrome is more likely to occur in people who drink more than the recommended limit of alcohol (2 drinks a day for men or 1 drink a day for non-pregnant women). It is also more likely to affect heavy drinkers who have been using alcohol for long periods of time. The more a person drinks and the longer he or she drinks, the greater the risk of alcohol withdrawal syndrome. Severe withdrawal is more likely to develop in someone who:  Had severe alcohol withdrawal in the past.  Had a seizure during a previous episode of alcohol withdrawal.  Is elderly.  Uses other drugs.  Has a long-term (chronic) medical problem, such as heart, lung, or liver disease.  Has depression.  Does not get enough nutrients from his or her diet (malnutrition). What are the signs or symptoms? Symptoms of this condition can be mild to moderate, or they can be severe. Symptoms may develop a few hours (or up to a day) after a person changes his or her drinking patterns. During the 48 hours after he or she has stopped drinking, the following symptoms may go away or  get better:  Uncontrollable shaking (tremor).  Sweating.  Headache.  Anxiety.  Inability to relax (agitation).  Trouble sleeping (insomnia).  Irregular heartbeats (palpitations).  Alcohol cravings.  Seizure. The following symptoms may get worse 24-48 hours after a person has decreased or stopped alcohol use, and they may gradually improve over a period of days or weeks:  Nausea and vomiting.  Fatigue.  Sensitivity to light and sounds.  Confusion and inability to think clearly.  Loss of appetite.  Mood swings, irritability, depression, and anxiety.  Insomnia and nightmares. The following symptoms are severe and life-threatening. When these symptoms occur together, they are called delirium tremens (DTs):  High blood pressure.  Increased heart rate.  Trouble breathing.  Seizures. These may go away along with other symptoms, or they may persist.  Seeing, hearing, feeling, smelling, or tasting things that are not there (hallucinations). If you experience hallucinations, they usually begin 12-24 hours after a change in drinking patterns. Delirium tremens requires immediate hospitalization. How is this diagnosed? This condition may be diagnosed based on:  Your symptoms and medical history.  Your history of alcohol use. Your health care provider may ask questions about your drinking behavior. It is important to be honest when you answer these questions.  A psychological assessment.  A physical exam.  Blood tests or urine tests to measure blood alcohol level and to rule out other causes of symptoms.  MRI or CT scan. This may be done if you seem to have abnormal thinking or behaviors (altered mental status). Diagnosis can be difficult.  People going through withdrawal often avoid seeking medical care and are not thinking clearly. Friends and family members play an important role in recognizing symptoms and encouraging loved ones to get treatment. How is this  treated? Most people with symptoms of withdrawal can be treated outside of a hospital setting (outpatient treatment), with close monitoring such as daily check-ins with a health care provider and counseling. You may need treatment at a hospital or treatment center (inpatient treatment) if:  You have a history of delirium tremens or seizures.  You have severe symptoms.  You are addicted to other drugs.  You cannot swallow medicine.  You have a serious medical condition such as heart failure.  You experienced withdrawal in the past but then you continued drinking alcohol.  You are not likely to commit to an outpatient treatment schedule. Treatment may involve:  Monitoring your blood pressure, pulse, and breathing.  IV fluids to keep you hydrated.  Medicines to reduce withdrawal symptoms and discomfort (benzodiazepines).  Medicine to reduce anxiety.  Medicine to prevent or control seizures.  Multivitamins and B vitamins.  Having a health care provider check on you daily. It is important to get treatment for alcohol withdrawal early. Getting treatment early can:  Speed up your recovery from withdrawal symptoms.  Make you more successful with long-term stoppage of alcohol use (sobriety). If you need help to stop drinking, your health care provider may recommend a long-term treatment plan that includes:  Medicines to help treat alcohol use disorder.  Substance abuse counseling.  Support groups. Follow these instructions at home:  Take over-the-counter and prescription medicines (including vitamin supplements) only as told by your health care provider.  Do not drink alcohol.  Do not drive until your health care provider approves.  Have someone you trust stay with you or be available if you need help with your symptoms or with not drinking.  Drink enough fluid to keep your urine pale yellow.  Consider joining an alcohol support group or treatment program. These can  provide emotional support, advice, and guidance.  Keep all follow-up visits as told by your health care provider. This is important.   Contact a health care provider if:  Your symptoms get worse instead of better.  You cannot eat or drink without vomiting.  You are struggling with not drinking alcohol.  You cannot stop drinking alcohol. Get help right away if:  You have an irregular heartbeat.  You have chest pain.  You have trouble breathing.  You have a seizure for the first time.  You hallucinate.  You become very confused. Summary  Alcohol withdrawal is a group of symptoms that can develop when a person who drinks heavily and regularly stops drinking or drinks less.  Symptoms of this condition can be mild to moderate, or they can be severe.  Treatment may include hospitalization, medicine, and counseling. This information is not intended to replace advice given to you by your health care provider. Make sure you discuss any questions you have with your health care provider. Document Revised: 03/09/2017 Document Reviewed: 12/01/2016 Elsevier Patient Education  2021 [employer].

## 2020-06-28 NOTE — Progress Notes (Addendum)
Pt. Discharge via wheelchair accompanied by staff. Pt. Is alert and oriented, no complains. Denies withdrawal symptoms, vital sign stable. Discharge instructions and education discussed with patient, pt. Verbalized understanding. All personal belongings are with the patient.

## 2020-06-28 NOTE — Plan of Care (Signed)

## 2020-07-01 DIAGNOSIS — K219 Gastro-esophageal reflux disease without esophagitis: Secondary | ICD-10-CM | POA: Diagnosis not present

## 2020-07-01 DIAGNOSIS — E7800 Pure hypercholesterolemia, unspecified: Secondary | ICD-10-CM | POA: Diagnosis not present

## 2020-07-01 DIAGNOSIS — I10 Essential (primary) hypertension: Secondary | ICD-10-CM | POA: Diagnosis not present

## 2020-07-01 DIAGNOSIS — G8929 Other chronic pain: Secondary | ICD-10-CM | POA: Diagnosis not present

## 2020-07-01 DIAGNOSIS — N400 Enlarged prostate without lower urinary tract symptoms: Secondary | ICD-10-CM | POA: Diagnosis not present

## 2020-07-01 DIAGNOSIS — F331 Major depressive disorder, recurrent, moderate: Secondary | ICD-10-CM | POA: Diagnosis not present

## 2020-07-01 DIAGNOSIS — G4700 Insomnia, unspecified: Secondary | ICD-10-CM | POA: Diagnosis not present

## 2020-07-01 DIAGNOSIS — E782 Mixed hyperlipidemia: Secondary | ICD-10-CM | POA: Diagnosis not present

## 2020-07-13 DIAGNOSIS — N400 Enlarged prostate without lower urinary tract symptoms: Secondary | ICD-10-CM | POA: Diagnosis not present

## 2020-07-13 DIAGNOSIS — K219 Gastro-esophageal reflux disease without esophagitis: Secondary | ICD-10-CM | POA: Diagnosis not present

## 2020-07-13 DIAGNOSIS — I10 Essential (primary) hypertension: Secondary | ICD-10-CM | POA: Diagnosis not present

## 2020-07-13 DIAGNOSIS — G4700 Insomnia, unspecified: Secondary | ICD-10-CM | POA: Diagnosis not present

## 2020-07-13 DIAGNOSIS — F331 Major depressive disorder, recurrent, moderate: Secondary | ICD-10-CM | POA: Diagnosis not present

## 2020-07-13 DIAGNOSIS — E782 Mixed hyperlipidemia: Secondary | ICD-10-CM | POA: Diagnosis not present

## 2020-07-13 DIAGNOSIS — G8929 Other chronic pain: Secondary | ICD-10-CM | POA: Diagnosis not present

## 2020-07-13 DIAGNOSIS — E7800 Pure hypercholesterolemia, unspecified: Secondary | ICD-10-CM | POA: Diagnosis not present

## 2020-07-19 DIAGNOSIS — M542 Cervicalgia: Secondary | ICD-10-CM | POA: Diagnosis not present

## 2020-07-26 DIAGNOSIS — E872 Acidosis: Secondary | ICD-10-CM | POA: Diagnosis not present

## 2020-07-26 DIAGNOSIS — F10130 Alcohol abuse with withdrawal, uncomplicated: Secondary | ICD-10-CM | POA: Diagnosis not present

## 2020-07-27 DIAGNOSIS — R000 Tachycardia, unspecified: Secondary | ICD-10-CM | POA: Diagnosis not present

## 2020-07-27 DIAGNOSIS — I491 Atrial premature depolarization: Secondary | ICD-10-CM | POA: Diagnosis not present

## 2020-07-28 DIAGNOSIS — E872 Acidosis: Secondary | ICD-10-CM | POA: Diagnosis not present

## 2020-07-28 DIAGNOSIS — F10139 Alcohol abuse with withdrawal, unspecified: Secondary | ICD-10-CM | POA: Diagnosis not present

## 2020-08-09 DIAGNOSIS — R0600 Dyspnea, unspecified: Secondary | ICD-10-CM | POA: Diagnosis not present

## 2020-08-11 DIAGNOSIS — I10 Essential (primary) hypertension: Secondary | ICD-10-CM | POA: Diagnosis not present

## 2020-08-11 DIAGNOSIS — E782 Mixed hyperlipidemia: Secondary | ICD-10-CM | POA: Diagnosis not present

## 2020-08-11 DIAGNOSIS — N400 Enlarged prostate without lower urinary tract symptoms: Secondary | ICD-10-CM | POA: Diagnosis not present

## 2020-08-11 DIAGNOSIS — G4700 Insomnia, unspecified: Secondary | ICD-10-CM | POA: Diagnosis not present

## 2020-08-11 DIAGNOSIS — F331 Major depressive disorder, recurrent, moderate: Secondary | ICD-10-CM | POA: Diagnosis not present

## 2020-08-11 DIAGNOSIS — E7800 Pure hypercholesterolemia, unspecified: Secondary | ICD-10-CM | POA: Diagnosis not present

## 2020-08-11 DIAGNOSIS — G8929 Other chronic pain: Secondary | ICD-10-CM | POA: Diagnosis not present

## 2020-08-11 DIAGNOSIS — K219 Gastro-esophageal reflux disease without esophagitis: Secondary | ICD-10-CM | POA: Diagnosis not present

## 2020-08-15 NOTE — Progress Notes (Signed)
Follow up visit  Subjective:   Ruben [name], male    DOB: 1950-11-27, 70 y.o.   MRN: [id number]   HPI   Chief Complaint  Patient presents with  . Palpitations  . Follow-up    69 y.o. 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  Previous work-up and showed no significant cardiac abnormalities.  He reports unchanged dyspnea with minimal exertion, such as waxing his car.  He denies any orthopnea, PND, leg edema.  Also denies any chest pain.  His heart rate remains elevated at baseline.  He continues to struggle with alcoholism.  He lives by himself, does not have any immediate family members nearby.  He is retired, and does not have any activities to engage himself.  At baseline, he drinks 700 mL of vodka every day.  Fortunately, he has been on "dry spell" for last 7 days.  He denies any withdrawal symptoms.  Reviewed recent labs performed by PCP, details below.   Current Outpatient Medications on File Prior to Visit  Medication Sig Dispense Refill  . amLODipine (NORVASC) 5 MG tablet Take 1 tablet (5 mg total) by mouth daily. 30 tablet 11  . atorvastatin (LIPITOR) 20 MG tablet Take 1 tablet (20 mg total) by mouth daily. 30 tablet 0  . chlordiazePOXIDE (LIBRIUM) 25 MG capsule Take take 1 tablet 3 times a day for 3 days followed by 1 tablet twice a day for 3 days followed by 1 tablet once a day for 3 days and then stop 18 capsule 0  . doxepin (SINEQUAN) 10 MG capsule Take 30 mg by mouth at bedtime.    . folic acid (FOLVITE) 1 MG tablet Take 1 tablet (1 mg total) by mouth daily. 30 tablet 0  . Magnesium 400 MG TABS Take 1,600 mg by mouth daily.    . magnesium citrate SOLN Take 296 mLs (1 Bottle total) by mouth daily as needed for severe constipation. 195 mL   . metoprolol tartrate (LOPRESSOR) 25 MG tablet Take 1 tablet (25 mg total) by mouth 2 (two) times  daily. (Patient taking differently: Take 50 mg by mouth 2 (two) times daily.) 60 tablet 0  . Multiple Vitamin (MULTIVITAMIN WITH MINERALS) TABS tablet Take 1 tablet by mouth daily.    . polyethylene glycol (MIRALAX / GLYCOLAX) 17 g packet Take 17 g by mouth 2 (two) times daily as needed for mild constipation. (Patient not taking: Reported on 06/22/2020) 14 each 0  . senna-docusate (SENOKOT-S) 8.6-50 MG tablet Take 1 tablet by mouth 2 (two) times daily as needed for moderate constipation. (Patient not taking: Reported on 06/22/2020)    . thiamine 100 MG tablet Take 1 tablet (100 mg total) by mouth daily. 30 tablet 0  . traZODone (DESYREL) 150 MG tablet Take 150 mg by mouth at bedtime.     No current facility-administered medications on file prior to visit.    Cardiovascular & other pertient studies:  EKG 08/16/2020: Sinus tachycardia 110 bpm Frequent PVCs RSR(V1) -nondiagnostic  Echocardiogram 12/2016: Mid LVH. LVEF 55%. Indeterminate diastolic filing Mild MR  CTA 12/2016: No PE Moderate paraesophageal hernia with stomach and portions of colon above the diaphragm. 3.5 x 3 mm nodular opacity right upper lobe.  Event monitor 11/2016: ccasional PAC, no other arrhythmia  Exercise treadmill stress test 01/2014: 7.3 METS No ischemic changes  Recent labs: 08/09/2020: Glucose 101, BUN/Cr  13/1.17. EGFR 68. Na/K 137/3.6. Rest of the CMP normal H/H 14/42. MCV 88. Platelets 143 HbA1C NaA Lipid panel N/A TSH 3.8 normal BNP 78   Review of Systems  Cardiovascular: Positive for dyspnea on exertion and palpitations. Negative for chest pain, leg swelling and syncope.         Vitals:   08/16/20 0848  BP: (!) 153/87  Pulse: 100  Resp: 16  Temp: 98.8 F (37.1 C)  SpO2: 98%     Body mass index is 29.46 kg/m. Filed Weights   08/16/20 0848  Weight: 211 lb 3.2 oz (95.8 kg)     Objective:   Physical Exam Vitals and nursing note reviewed.  Constitutional:      General: He is not  in acute distress. Neck:     Vascular: No JVD.  Cardiovascular:     Rate and Rhythm: Regular rhythm. Tachycardia present.     Heart sounds: Normal heart sounds. No murmur heard.   Pulmonary:     Effort: Pulmonary effort is normal.     Breath sounds: Normal breath sounds. No wheezing or rales.  Abdominal:     General: Abdomen is protuberant.  Musculoskeletal:     Left lower leg: No edema.           Assessment & Recommendations:   70 y.o. 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  Given his history of heavy alcohol abuse, I will repeat echocardiogram.  Given his resting tachycardia, will also obtain 3-day cardiac telemetry to assess his baseline heart rate and PVC burden.  I congratulated patient on his "dry spell" for 7 days and encouraged him to continue this pill.  I cautioned him against sudden cessation of concern for withdrawal.  I recommend him to continue follow-up with Dr. Jacelyn Grip regarding management of other alcohol related illnesses.  Unless any significant abnormalities found on above cardiac work-up, I will see him on as-needed basis.    [name], MD Pager: [phone] Office: [phone]

## 2020-08-16 ENCOUNTER — Encounter: Payer: Self-pay | Admitting: Cardiology

## 2020-08-16 ENCOUNTER — Other Ambulatory Visit: Payer: Medicare Other

## 2020-08-16 ENCOUNTER — Other Ambulatory Visit: Payer: Self-pay

## 2020-08-16 ENCOUNTER — Ambulatory Visit: Payer: Medicare Other | Admitting: Cardiology

## 2020-08-16 VITALS — BP 153/87 | HR 100 | Temp 98.8°F | Resp 16 | Ht 71.0 in | Wt 211.2 lb

## 2020-08-16 DIAGNOSIS — R0609 Other forms of dyspnea: Secondary | ICD-10-CM | POA: Insufficient documentation

## 2020-08-16 DIAGNOSIS — R0600 Dyspnea, unspecified: Secondary | ICD-10-CM | POA: Insufficient documentation

## 2020-08-16 DIAGNOSIS — R000 Tachycardia, unspecified: Secondary | ICD-10-CM

## 2020-08-16 DIAGNOSIS — R002 Palpitations: Secondary | ICD-10-CM | POA: Insufficient documentation

## 2020-08-23 DIAGNOSIS — R000 Tachycardia, unspecified: Secondary | ICD-10-CM | POA: Diagnosis not present

## 2020-08-23 DIAGNOSIS — R002 Palpitations: Secondary | ICD-10-CM | POA: Diagnosis not present

## 2020-08-23 DIAGNOSIS — R0600 Dyspnea, unspecified: Secondary | ICD-10-CM | POA: Diagnosis not present

## 2020-08-25 ENCOUNTER — Other Ambulatory Visit: Payer: Self-pay

## 2020-08-25 ENCOUNTER — Encounter (HOSPITAL_COMMUNITY): Payer: Self-pay | Admitting: *Deleted

## 2020-08-25 ENCOUNTER — Emergency Department (HOSPITAL_COMMUNITY)
Admission: EM | Admit: 2020-08-25 | Discharge: 2020-08-25 | Disposition: A | Discharge status: Home / Self Care | Payer: Medicare Other | Attending: Emergency Medicine | Admitting: Emergency Medicine

## 2020-08-25 DIAGNOSIS — R002 Palpitations: Secondary | ICD-10-CM | POA: Diagnosis not present

## 2020-08-25 DIAGNOSIS — F1020 Alcohol dependence, uncomplicated: Secondary | ICD-10-CM

## 2020-08-25 DIAGNOSIS — Z79899 Other long term (current) drug therapy: Secondary | ICD-10-CM | POA: Diagnosis not present

## 2020-08-25 DIAGNOSIS — I10 Essential (primary) hypertension: Secondary | ICD-10-CM | POA: Diagnosis not present

## 2020-08-25 DIAGNOSIS — F10920 Alcohol use, unspecified with intoxication, uncomplicated: Secondary | ICD-10-CM

## 2020-08-25 LAB — CBC
HCT: 47.6 % (ref 39.0–52.0)
Hemoglobin: 15.2 g/dL (ref 13.0–17.0)
MCH: 29.6 pg (ref 26.0–34.0)
MCHC: 31.9 g/dL (ref 30.0–36.0)
MCV: 92.8 fL (ref 80.0–100.0)
Platelets: 182 10*3/uL (ref 150–400)
RBC: 5.13 MIL/uL (ref 4.22–5.81)
RDW: 14.5 % (ref 11.5–15.5)
WBC: 6.4 10*3/uL (ref 4.0–10.5)
nRBC: 0 % (ref 0.0–0.2)

## 2020-08-25 LAB — COMPREHENSIVE METABOLIC PANEL
ALT: 41 U/L (ref 0–44)
AST: 36 U/L (ref 15–41)
Albumin: 4 g/dL (ref 3.5–5.0)
Alkaline Phosphatase: 63 U/L (ref 38–126)
Anion gap: 13 (ref 5–15)
BUN: 13 mg/dL (ref 8–23)
CO2: 23 mmol/L (ref 22–32)
Calcium: 8.4 mg/dL — ABNORMAL LOW (ref 8.9–10.3)
Chloride: 108 mmol/L (ref 98–111)
Creatinine, Ser: 1.01 mg/dL (ref 0.61–1.24)
GFR, Estimated: >60 mL/min (ref >60)
Glucose, Bld: 86 mg/dL (ref 70–99)
Potassium: 3.9 mmol/L (ref 3.5–5.1)
Sodium: 144 mmol/L (ref 135–145)
Total Bilirubin: 0.6 mg/dL (ref 0.3–1.2)
Total Protein: 7.5 g/dL (ref 6.5–8.1)

## 2020-08-25 LAB — ETHANOL: Alcohol, Ethyl (B): 303 mg/dL (ref <10)

## 2020-08-25 MED ORDER — CHLORDIAZEPOXIDE HCL 25 MG PO CAPS: ORAL_CAPSULE | ORAL | 0 refills | Status: DC

## 2020-08-25 MED ORDER — LORAZEPAM 1 MG PO TABS
1.0000 mg | ORAL_TABLET | Freq: Once | ORAL | Status: AC
  Administered 2020-08-25: 1 mg via ORAL
  Filled 2020-08-25: qty 1

## 2020-08-25 NOTE — ED Notes (Signed)
Pt tolerating ginger ale 

## 2020-08-25 NOTE — ED Provider Notes (Signed)
[facility] EMERGENCY DEPARTMENT Provider Note   CSN: [id number] Arrival date & time: 08/25/20  1518     History Chief Complaint  Patient presents with  . Palpitations    Ruben [name] is a 70 y.o. male.  Patient indicates hx etoh abuse, and presents with palpitations. States episodes occur sporadically, at rest, last seconds per episode. No associated nv, diaphoresis or sob. No pleuritic pain. No syncope. No hx dysrhythmia other than prior diagnosis pvc. States recent etoh use. Denies hx severe etoh withdrawal. Is eating/drinking and in general taking meds although cannot remember if took today. Currently denies pain of any sort, no faintness.   The history is provided by the patient and the EMS personnel.  Palpitations Associated symptoms: no back pain, no chest pain, no shortness of breath and no vomiting        Past Medical History:  Diagnosis Date  . Alcoholism ([place])   . Alcoholism ([place])   . Aortic atherosclerosis ([place]) 12/26/2016   Noted on CT  . Arthritis   . BPH (benign prostatic hyperplasia)   . Chronic constipation    takes magnesium  . Duodenal ulcer   . Dyspnea 12/17/2017  . ED (erectile dysfunction)   . Foley catheter in place    history of no longer in place  . GERD (gastroesophageal reflux disease)   . Heart palpitations    Occ  . Hiatal hernia   . History of adenomatous polyp of colon    tubular adenoma's 2005  . History of Helicobacter pylori infection    2008  . Hypercholesteremia   . Hypertension   . MR (mitral regurgitation)    Mild, noted on ECHO  . Pre-diabetes   . Pulmonary nodule 12/26/2016   noted on CT, 5 x 3 mm nodular opacity right upper lobe  . Rosacea   . Schatzki's ring    last dilated 02/ 2016  . Urinary retention    history of  . Varicocele 02/25/2014   Small to moderate left varicocele    Patient Active Problem List   Diagnosis Date Noted  . Sinus tachycardia 08/16/2020  . Palpitations 08/16/2020   . Exertional dyspnea 08/16/2020  . Mild renal insufficiency 06/23/2020  . Weight loss 04/08/2020  . Unsteady gait 04/08/2020  . Insomnia 04/08/2020  . Lump of skin of left upper extremity 04/08/2020  . Irregular heart beat 04/08/2020  . Pure hypercholesterolemia 04/08/2020  . Alcohol use disorder, severe, dependence ([place]) 04/03/2020  . Hyperlipidemia 04/03/2020  . Suicidal ideation 04/03/2020  . Metabolic acidosis 62/83/1517  . Thrombocytopenia ([place] 12/22/2019  . ARF (acute renal failure) ([place]) 10/01/2019  . Alcohol withdrawal ([place]) 10/01/2019  . Normochromic normocytic anemia 10/01/2019  . GERD (gastroesophageal reflux disease) 06/22/2019  . BPH (benign prostatic hyperplasia) 06/22/2019  . Essential hypertension 06/22/2019  . AKI (acute kidney injury) ([place])   . Alcohol intoxication ([place]) 06/21/2019  . Shoulder joint pain 03/31/2019  . S/P Nissen fundoplication (without gastrostomy tube) procedure 04/26/2018  . Rosacea 06/24/2011    Past Surgical History:  Procedure Laterality Date  . BALLOON DILATION N/A 06/08/2014   Procedure: BALLOON DILATION;  Surgeon: Garlan Fair, MD;  Location: [facility];  Service: Endoscopy;  Laterality: N/A;  . COLONOSCOPY WITH PROPOFOL N/A 06/08/2014   Procedure: COLONOSCOPY WITH PROPOFOL;  Surgeon: Garlan Fair, MD;  Location: [facility];  Service: Endoscopy;  Laterality: N/A;  . ESOPHAGOGASTRODUODENOSCOPY N/A 06/08/2014   Procedure: ESOPHAGOGASTRODUODENOSCOPY (EGD);  Surgeon: Garlan Fair,  MD;  Location: [facility];  Service: Endoscopy;  Laterality: N/A;  . ESOPHAGOGASTRODUODENOSCOPY (EGD) WITH ESOPHAGEAL DILATION N/A 12/31/2012   Procedure: ESOPHAGOGASTRODUODENOSCOPY (EGD) WITH ESOPHAGEAL DILATION;  Surgeon: Garlan Fair, MD;  Location: [facility];  Service: Endoscopy;  Laterality: N/A;  . GREEN LIGHT LASER TURP (TRANSURETHRAL RESECTION OF PROSTATE N/A 12/28/2015   Procedure: GREEN LIGHT LASER TURP (TRANSURETHRAL RESECTION OF  PROSTATE;  Surgeon: Festus Aloe, MD;  Location: [facility];  Service: Urology;  Laterality: N/A;       Family History  Problem Relation Age of Onset  . Heart failure Mother   . Stroke Father     Social History   Tobacco Use  . Smoking status: Never Smoker  . Smokeless tobacco: Never Used  Vaping Use  . Vaping Use: Never used  Substance Use Topics  . Alcohol use: Yes    Comment: 12 to 18 ounces vodka daily  . Drug use: No    Home Medications Prior to Admission medications   Medication Sig Start Date End Date Taking? Authorizing Provider  amLODipine (NORVASC) 5 MG tablet Take 1 tablet (5 mg total) by mouth daily. 12/30/19 12/29/20  Mercy Riding, MD  atorvastatin (LIPITOR) 20 MG tablet Take 1 tablet (20 mg total) by mouth daily. 10/06/19 12/22/19  Alma Friendly, MD  doxepin (SINEQUAN) 10 MG capsule Take 30 mg by mouth at bedtime. 05/12/20   [provider]  folic acid (FOLVITE) 1 MG tablet Take 1 tablet (1 mg total) by mouth daily. 06/29/20   Bonnielee Haff, MD  Magnesium 400 MG TABS Take 1,600 mg by mouth daily.    [provider]  magnesium citrate SOLN Take 296 mLs (1 Bottle total) by mouth daily as needed for severe constipation. 12/30/19   Mercy Riding, MD  metoprolol tartrate (LOPRESSOR) 25 MG tablet Take 1 tablet (25 mg total) by mouth 2 (two) times daily. Patient taking differently: Take 50 mg by mouth 2 (two) times daily. 10/06/19 12/22/19  Alma Friendly, MD  Multiple Vitamin (MULTIVITAMIN WITH MINERALS) TABS tablet Take 1 tablet by mouth daily. 12/30/19   Mercy Riding, MD  polyethylene glycol (MIRALAX / GLYCOLAX) 17 g packet Take 17 g by mouth 2 (two) times daily as needed for mild constipation. 12/30/19   Mercy Riding, MD  senna-docusate (SENOKOT-S) 8.6-50 MG tablet Take 1 tablet by mouth 2 (two) times daily as needed for moderate constipation. 12/30/19   Mercy Riding, MD  thiamine 100 MG tablet Take 1 tablet (100 mg total)  by mouth daily. 06/29/20   Bonnielee Haff, MD  traZODone (DESYREL) 150 MG tablet Take 150 mg by mouth at bedtime. 06/04/20   [provider]    Allergies    Patient has no known allergies.  Review of Systems   Review of Systems  Constitutional: Negative for fever.  HENT: Negative for sore throat.   Eyes: Negative for redness.  Respiratory: Negative for shortness of breath.   Cardiovascular: Positive for palpitations. Negative for chest pain and leg swelling.  Gastrointestinal: Negative for abdominal pain and vomiting.  Genitourinary: Negative for flank pain.  Musculoskeletal: Negative for back pain and neck pain.  Skin: Negative for rash.  Neurological: Negative for syncope and headaches.  Hematological: Does not bruise/bleed easily.  Psychiatric/Behavioral: Negative for confusion.    Physical Exam Updated Vital Signs BP (!) 149/110 (BP Location: Right Arm)   Pulse 92   Temp 97.9 F (36.6 C) (Axillary)  Resp 18   SpO2 97%   Physical Exam Vitals and nursing note reviewed.  Constitutional:      Appearance: Normal appearance. He is well-developed.  HENT:     Head: Atraumatic.     Nose: Nose normal.     Mouth/Throat:     Mouth: Mucous membranes are moist.     Pharynx: Oropharynx is clear.  Eyes:     General: No scleral icterus.    Conjunctiva/sclera: Conjunctivae normal.     Pupils: Pupils are equal, round, and reactive to light.  Neck:     Trachea: No tracheal deviation.     Comments: Thyroid not grossly enlarged or tender.  Cardiovascular:     Rate and Rhythm: Normal rate and regular rhythm.     Pulses: Normal pulses.     Heart sounds: Normal heart sounds. No murmur heard. No friction rub. No gallop.   Pulmonary:     Effort: Pulmonary effort is normal. No accessory muscle usage or respiratory distress.     Breath sounds: Normal breath sounds.  Abdominal:     General: Bowel sounds are normal. There is no distension.     Palpations: Abdomen is soft.      Tenderness: There is no abdominal tenderness.  Genitourinary:    Comments: No cva tenderness. Musculoskeletal:        General: No swelling or tenderness.     Cervical back: Normal range of motion and neck supple. No rigidity.     Right lower leg: No edema.     Left lower leg: No edema.  Skin:    General: Skin is warm and dry.     Findings: No rash.  Neurological:     Mental Status: He is alert.     Comments: Alert, speech clear.   Psychiatric:        Mood and Affect: Mood normal.     ED Results / Procedures / Treatments   Labs (all labs ordered are listed, but only abnormal results are displayed) Results for orders placed or performed during the hospital encounter of 08/25/20  CBC  Result Value Ref Range   WBC 6.4 4.0 - 10.5 K/uL   RBC 5.13 4.22 - 5.81 MIL/uL   Hemoglobin 15.2 13.0 - 17.0 g/dL   HCT 47.6 39.0 - 52.0 %   MCV 92.8 80.0 - 100.0 fL   MCH 29.6 26.0 - 34.0 pg   MCHC 31.9 30.0 - 36.0 g/dL   RDW 14.5 11.5 - 15.5 %   Platelets 182 150 - 400 K/uL   nRBC 0.0 0.0 - 0.2 %  Comprehensive metabolic panel  Result Value Ref Range   Sodium 144 135 - 145 mmol/L   Potassium 3.9 3.5 - 5.1 mmol/L   Chloride 108 98 - 111 mmol/L   CO2 23 22 - 32 mmol/L   Glucose, Bld 86 70 - 99 mg/dL   BUN 13 8 - 23 mg/dL   Creatinine, Ser 1.01 0.61 - 1.24 mg/dL   Calcium 8.4 (L) 8.9 - 10.3 mg/dL   Total Protein 7.5 6.5 - 8.1 g/dL   Albumin 4.0 3.5 - 5.0 g/dL   AST 36 15 - 41 U/L   ALT 41 0 - 44 U/L   Alkaline Phosphatase 63 38 - 126 U/L   Total Bilirubin 0.6 0.3 - 1.2 mg/dL   GFR, Estimated >60 >60 mL/min   Anion gap 13 5 - 15  Ethanol  Result Value Ref Range   Alcohol, Ethyl (B) 303 (  HH) <10 mg/dL   No results found.  EKG EKG Interpretation  Date/Time:  Wednesday Aug 25 2020 15:19:38 EDT Ventricular Rate:  92 PR Interval:  153 QRS Duration: 91 QT Interval:  371 QTC Calculation: 459 R Axis:   -11 Text Interpretation: Sinus rhythm Nonspecific T wave abnormality Confirmed  by Lajean Saver [id number]) on 08/25/2020 3:21:28 PM   Radiology No results found.  Procedures Procedures   Medications Ordered in ED Medications - No data to display  ED Course  I have reviewed the triage vital signs and the nursing notes.  Pertinent labs & imaging results that were available during my care of the patient were reviewed by me and considered in my medical decision making (see chart for details).    MDM Rules/Calculators/A&P                          Continuous pulse ox and cardiac monitoring. Stat labs. Ecg.   Reviewed nursing notes and prior charts for additional history.   Occasional pvc on monitor, corresponds to symptoms.   Patient asks for med for 'withdrawal'. Ativan 1 mg po.  Labs reviewed/interpreted by me - chem normal. etoh is high.   Po fluids, food.   Recheck pt, no c/o. No pain. No sob. No nv. Tolerating po. No tremor or shakes.   Pt currently appears stable for d/c.   Will provided resource guide for etoh treatment/rehab options.   Return precautions provided.     Final Clinical Impression(s) / ED Diagnoses Final diagnoses:  None    Rx / DC Orders ED Discharge Orders    None       Lajean Saver, MD 08/25/20 2254

## 2020-08-25 NOTE — Discharge Instructions (Addendum)
It was our pleasure to provide your ER care today - we hope that you feel better.  Rest. Drink plenty of fluids/stay well hydrated. Avoid alcohol use. See resource guide provided for alcohol rehab/treatment options in the area.   You may take librium as need, as prescribed, to help with withdrawal symptoms. No driving anytime when drinking alcohol, for the next 10 hours, or if taking librium.   Also follow up with primary care doctor in the next 1-2 weeks.   Return to ER if worse, new symptoms, fevers, new or severe pain, trouble breathing, or other concern.

## 2020-08-25 NOTE — ED Triage Notes (Signed)
Pt here via [facility]  c/o palpitations.  EKG showed nsr with some pvc.  Per neighbors, pt is mentating per norm.  Drank at least 1 bottle of vodka today, which is his norm.  cbg 89 bp 138/88 Hr 90 sats 99%

## 2020-08-26 ENCOUNTER — Ambulatory Visit: Payer: Medicare Other

## 2020-08-26 DIAGNOSIS — R002 Palpitations: Secondary | ICD-10-CM | POA: Diagnosis not present

## 2020-08-26 DIAGNOSIS — R000 Tachycardia, unspecified: Secondary | ICD-10-CM | POA: Diagnosis not present

## 2020-08-26 DIAGNOSIS — R0609 Other forms of dyspnea: Secondary | ICD-10-CM | POA: Diagnosis not present

## 2020-08-26 DIAGNOSIS — R0600 Dyspnea, unspecified: Secondary | ICD-10-CM

## 2020-09-08 ENCOUNTER — Emergency Department (HOSPITAL_COMMUNITY): Payer: Medicare Other

## 2020-09-08 ENCOUNTER — Inpatient Hospital Stay (HOSPITAL_COMMUNITY)
Admission: EM | Admit: 2020-09-08 | Discharge: 2020-09-14 | DRG: 683 | Disposition: A | Discharge status: Home / Self Care | Payer: Medicare Other | Attending: Internal Medicine | Admitting: Internal Medicine

## 2020-09-08 ENCOUNTER — Encounter (HOSPITAL_COMMUNITY): Payer: Self-pay

## 2020-09-08 DIAGNOSIS — E871 Hypo-osmolality and hyponatremia: Secondary | ICD-10-CM | POA: Diagnosis not present

## 2020-09-08 DIAGNOSIS — I340 Nonrheumatic mitral (valve) insufficiency: Secondary | ICD-10-CM | POA: Diagnosis present

## 2020-09-08 DIAGNOSIS — D696 Thrombocytopenia, unspecified: Secondary | ICD-10-CM | POA: Diagnosis present

## 2020-09-08 DIAGNOSIS — N400 Enlarged prostate without lower urinary tract symptoms: Secondary | ICD-10-CM | POA: Diagnosis present

## 2020-09-08 DIAGNOSIS — R443 Hallucinations, unspecified: Secondary | ICD-10-CM | POA: Diagnosis present

## 2020-09-08 DIAGNOSIS — R140 Abdominal distension (gaseous): Secondary | ICD-10-CM | POA: Diagnosis not present

## 2020-09-08 DIAGNOSIS — E7800 Pure hypercholesterolemia, unspecified: Secondary | ICD-10-CM | POA: Diagnosis present

## 2020-09-08 DIAGNOSIS — Z743 Need for continuous supervision: Secondary | ICD-10-CM | POA: Diagnosis not present

## 2020-09-08 DIAGNOSIS — D649 Anemia, unspecified: Secondary | ICD-10-CM | POA: Diagnosis present

## 2020-09-08 DIAGNOSIS — E876 Hypokalemia: Secondary | ICD-10-CM | POA: Diagnosis present

## 2020-09-08 DIAGNOSIS — Z8249 Family history of ischemic heart disease and other diseases of the circulatory system: Secondary | ICD-10-CM | POA: Diagnosis not present

## 2020-09-08 DIAGNOSIS — I10 Essential (primary) hypertension: Secondary | ICD-10-CM | POA: Diagnosis present

## 2020-09-08 DIAGNOSIS — Z20822 Contact with and (suspected) exposure to covid-19: Secondary | ICD-10-CM | POA: Diagnosis present

## 2020-09-08 DIAGNOSIS — F10239 Alcohol dependence with withdrawal, unspecified: Secondary | ICD-10-CM | POA: Diagnosis present

## 2020-09-08 DIAGNOSIS — R911 Solitary pulmonary nodule: Secondary | ICD-10-CM | POA: Diagnosis present

## 2020-09-08 DIAGNOSIS — F5105 Insomnia due to other mental disorder: Secondary | ICD-10-CM | POA: Diagnosis present

## 2020-09-08 DIAGNOSIS — Z87448 Personal history of other diseases of urinary system: Secondary | ICD-10-CM | POA: Diagnosis not present

## 2020-09-08 DIAGNOSIS — R7303 Prediabetes: Secondary | ICD-10-CM | POA: Diagnosis present

## 2020-09-08 DIAGNOSIS — R000 Tachycardia, unspecified: Secondary | ICD-10-CM | POA: Diagnosis not present

## 2020-09-08 DIAGNOSIS — G312 Degeneration of nervous system due to alcohol: Secondary | ICD-10-CM | POA: Diagnosis present

## 2020-09-08 DIAGNOSIS — R7989 Other specified abnormal findings of blood chemistry: Secondary | ICD-10-CM | POA: Diagnosis not present

## 2020-09-08 DIAGNOSIS — K760 Fatty (change of) liver, not elsewhere classified: Secondary | ICD-10-CM | POA: Diagnosis not present

## 2020-09-08 DIAGNOSIS — N179 Acute kidney failure, unspecified: Secondary | ICD-10-CM | POA: Diagnosis not present

## 2020-09-08 DIAGNOSIS — F1020 Alcohol dependence, uncomplicated: Secondary | ICD-10-CM | POA: Diagnosis not present

## 2020-09-08 DIAGNOSIS — F32A Depression, unspecified: Secondary | ICD-10-CM | POA: Diagnosis present

## 2020-09-08 DIAGNOSIS — F419 Anxiety disorder, unspecified: Secondary | ICD-10-CM | POA: Diagnosis present

## 2020-09-08 DIAGNOSIS — R739 Hyperglycemia, unspecified: Secondary | ICD-10-CM | POA: Diagnosis present

## 2020-09-08 DIAGNOSIS — R410 Disorientation, unspecified: Secondary | ICD-10-CM | POA: Diagnosis not present

## 2020-09-08 DIAGNOSIS — R945 Abnormal results of liver function studies: Secondary | ICD-10-CM

## 2020-09-08 DIAGNOSIS — Z79899 Other long term (current) drug therapy: Secondary | ICD-10-CM | POA: Diagnosis not present

## 2020-09-08 DIAGNOSIS — K219 Gastro-esophageal reflux disease without esophagitis: Secondary | ICD-10-CM | POA: Diagnosis present

## 2020-09-08 LAB — URINALYSIS, ROUTINE W REFLEX MICROSCOPIC
Bacteria, UA: NONE SEEN
Bilirubin Urine: NEGATIVE
Glucose, UA: NEGATIVE mg/dL
Ketones, ur: 20 mg/dL — AB
Leukocytes,Ua: NEGATIVE
Nitrite: NEGATIVE
Protein, ur: NEGATIVE mg/dL
Specific Gravity, Urine: 1.014 (ref 1.005–1.030)
pH: 5 (ref 5.0–8.0)

## 2020-09-08 LAB — CBC WITH DIFFERENTIAL/PLATELET
Abs Immature Granulocytes: 0.03 10*3/uL (ref 0.00–0.07)
Basophils Absolute: 0 10*3/uL (ref 0.0–0.1)
Basophils Relative: 0 %
Eosinophils Absolute: 0 10*3/uL (ref 0.0–0.5)
Eosinophils Relative: 0 %
HCT: 40.7 % (ref 39.0–52.0)
Hemoglobin: 13.5 g/dL (ref 13.0–17.0)
Immature Granulocytes: 1 %
Lymphocytes Relative: 11 %
Lymphs Abs: 0.7 10*3/uL (ref 0.7–4.0)
MCH: 31 pg (ref 26.0–34.0)
MCHC: 33.2 g/dL (ref 30.0–36.0)
MCV: 93.6 fL (ref 80.0–100.0)
Monocytes Absolute: 0.9 10*3/uL (ref 0.1–1.0)
Monocytes Relative: 14 %
Neutro Abs: 4.5 10*3/uL (ref 1.7–7.7)
Neutrophils Relative %: 74 %
Platelets: 63 10*3/uL — ABNORMAL LOW (ref 150–400)
RBC: 4.35 MIL/uL (ref 4.22–5.81)
RDW: 14 % (ref 11.5–15.5)
WBC: 6.1 10*3/uL (ref 4.0–10.5)
nRBC: 0 % (ref 0.0–0.2)

## 2020-09-08 LAB — COMPREHENSIVE METABOLIC PANEL
ALT: 51 U/L — ABNORMAL HIGH (ref 0–44)
AST: 67 U/L — ABNORMAL HIGH (ref 15–41)
Albumin: 4.2 g/dL (ref 3.5–5.0)
Alkaline Phosphatase: 66 U/L (ref 38–126)
Anion gap: 17 — ABNORMAL HIGH (ref 5–15)
BUN: 43 mg/dL — ABNORMAL HIGH (ref 8–23)
CO2: 18 mmol/L — ABNORMAL LOW (ref 22–32)
Calcium: 8.9 mg/dL (ref 8.9–10.3)
Chloride: 95 mmol/L — ABNORMAL LOW (ref 98–111)
Creatinine, Ser: 2.02 mg/dL — ABNORMAL HIGH (ref 0.61–1.24)
GFR, Estimated: 35 mL/min — ABNORMAL LOW (ref >60)
Glucose, Bld: 104 mg/dL — ABNORMAL HIGH (ref 70–99)
Potassium: 4.6 mmol/L (ref 3.5–5.1)
Sodium: 130 mmol/L — ABNORMAL LOW (ref 135–145)
Total Bilirubin: 2.1 mg/dL — ABNORMAL HIGH (ref 0.3–1.2)
Total Protein: 7.5 g/dL (ref 6.5–8.1)

## 2020-09-08 LAB — RAPID URINE DRUG SCREEN, HOSP PERFORMED
Amphetamines: NOT DETECTED
Barbiturates: NOT DETECTED
Benzodiazepines: POSITIVE — AB
Cocaine: NOT DETECTED
Opiates: NOT DETECTED
Tetrahydrocannabinol: NOT DETECTED

## 2020-09-08 LAB — AMMONIA: Ammonia: 47 umol/L — ABNORMAL HIGH (ref 9–35)

## 2020-09-08 LAB — ETHANOL: Alcohol, Ethyl (B): <10 mg/dL (ref <10)

## 2020-09-08 MED ORDER — LORAZEPAM 1 MG PO TABS
0.0000 mg | ORAL_TABLET | Freq: Two times a day (BID) | ORAL | Status: AC
  Administered 2020-09-10: 1 mg via ORAL
  Administered 2020-09-11: 2 mg via ORAL
  Filled 2020-09-08 (×2): qty 1
  Filled 2020-09-08: qty 2

## 2020-09-08 MED ORDER — LACTULOSE 10 GM/15ML PO SOLN: 20.0000 g | Freq: Two times a day (BID) | ORAL | Status: DC | PRN

## 2020-09-08 MED ORDER — ADULT MULTIVITAMIN W/MINERALS CH
1.0000 | ORAL_TABLET | Freq: Every day | ORAL | Status: DC
  Administered 2020-09-09 – 2020-09-14 (×6): 1 via ORAL
  Filled 2020-09-08 (×6): qty 1

## 2020-09-08 MED ORDER — THIAMINE HCL 100 MG/ML IJ SOLN
300.0000 mg | Freq: Once | INTRAVENOUS | Status: AC
  Administered 2020-09-08: 300 mg via INTRAVENOUS
  Filled 2020-09-08: qty 3

## 2020-09-08 MED ORDER — THIAMINE HCL 100 MG/ML IJ SOLN
100.0000 mg | Freq: Every day | INTRAMUSCULAR | Status: DC
  Filled 2020-09-08 (×3): qty 2

## 2020-09-08 MED ORDER — ONDANSETRON HCL 4 MG/2ML IJ SOLN: 4.0000 mg | Freq: Four times a day (QID) | INTRAMUSCULAR | Status: DC | PRN

## 2020-09-08 MED ORDER — TRAZODONE HCL 50 MG PO TABS
150.0000 mg | ORAL_TABLET | Freq: Every day | ORAL | Status: DC
  Administered 2020-09-09 – 2020-09-13 (×6): 150 mg via ORAL
  Filled 2020-09-08: qty 2
  Filled 2020-09-08 (×5): qty 1

## 2020-09-08 MED ORDER — AMLODIPINE BESYLATE 5 MG PO TABS
5.0000 mg | ORAL_TABLET | Freq: Every day | ORAL | Status: DC
  Administered 2020-09-09 – 2020-09-14 (×6): 5 mg via ORAL
  Filled 2020-09-08 (×6): qty 1

## 2020-09-08 MED ORDER — SODIUM CHLORIDE 0.9 % IV BOLUS
500.0000 mL | Freq: Once | INTRAVENOUS | Status: AC
  Administered 2020-09-08: 500 mL via INTRAVENOUS

## 2020-09-08 MED ORDER — LORAZEPAM 1 MG PO TABS
0.0000 mg | ORAL_TABLET | Freq: Four times a day (QID) | ORAL | Status: AC
  Administered 2020-09-09 – 2020-09-10 (×3): 1 mg via ORAL
  Administered 2020-09-10: 2 mg via ORAL
  Filled 2020-09-08: qty 2
  Filled 2020-09-08 (×3): qty 1

## 2020-09-08 MED ORDER — ONDANSETRON HCL 4 MG PO TABS: 4.0000 mg | ORAL_TABLET | Freq: Four times a day (QID) | ORAL | Status: DC | PRN

## 2020-09-08 MED ORDER — ACETAMINOPHEN 325 MG PO TABS
650.0000 mg | ORAL_TABLET | Freq: Four times a day (QID) | ORAL | Status: DC | PRN
  Administered 2020-09-10: 650 mg via ORAL
  Filled 2020-09-08: qty 2

## 2020-09-08 MED ORDER — SODIUM CHLORIDE 0.9 % IV SOLN
INTRAVENOUS | Status: DC
  Administered 2020-09-08: 1000 mL via INTRAVENOUS

## 2020-09-08 MED ORDER — DOXEPIN HCL 10 MG PO CAPS
30.0000 mg | ORAL_CAPSULE | Freq: Every day | ORAL | Status: DC
  Administered 2020-09-09 – 2020-09-13 (×6): 30 mg via ORAL
  Filled 2020-09-08 (×6): qty 3

## 2020-09-08 MED ORDER — LORAZEPAM 1 MG PO TABS
1.0000 mg | ORAL_TABLET | ORAL | Status: AC | PRN
  Administered 2020-09-10: 2 mg via ORAL
  Administered 2020-09-11: 3 mg via ORAL
  Filled 2020-09-08: qty 3
  Filled 2020-09-08: qty 2

## 2020-09-08 MED ORDER — SODIUM CHLORIDE 0.9 % IV SOLN: INTRAVENOUS | Status: AC

## 2020-09-08 MED ORDER — GABAPENTIN 100 MG PO CAPS
200.0000 mg | ORAL_CAPSULE | Freq: Three times a day (TID) | ORAL | Status: DC
  Administered 2020-09-09 (×2): 200 mg via ORAL
  Filled 2020-09-08 (×2): qty 2

## 2020-09-08 MED ORDER — LORAZEPAM 2 MG/ML IJ SOLN
1.0000 mg | INTRAMUSCULAR | Status: AC | PRN
  Administered 2020-09-10 – 2020-09-11 (×3): 2 mg via INTRAVENOUS
  Filled 2020-09-08 (×3): qty 1

## 2020-09-08 MED ORDER — METOPROLOL TARTRATE 25 MG PO TABS
25.0000 mg | ORAL_TABLET | Freq: Two times a day (BID) | ORAL | Status: DC
  Administered 2020-09-09 – 2020-09-14 (×12): 25 mg via ORAL
  Filled 2020-09-08 (×12): qty 1

## 2020-09-08 MED ORDER — THIAMINE HCL 100 MG PO TABS
100.0000 mg | ORAL_TABLET | Freq: Every day | ORAL | Status: DC
  Administered 2020-09-09 – 2020-09-14 (×6): 100 mg via ORAL
  Filled 2020-09-08 (×6): qty 1

## 2020-09-08 MED ORDER — ACETAMINOPHEN 650 MG RE SUPP: 650.0000 mg | Freq: Four times a day (QID) | RECTAL | Status: DC | PRN

## 2020-09-08 MED ORDER — THIAMINE HCL 100 MG/ML IJ SOLN: 300.0000 mg | Freq: Once | INTRAMUSCULAR | Status: DC

## 2020-09-08 MED ORDER — FOLIC ACID 1 MG PO TABS
1.0000 mg | ORAL_TABLET | Freq: Every day | ORAL | Status: DC
  Administered 2020-09-09 – 2020-09-14 (×6): 1 mg via ORAL
  Filled 2020-09-08 (×6): qty 1

## 2020-09-08 MED ORDER — FOLIC ACID 1 MG PO TABS
1.0000 mg | ORAL_TABLET | Freq: Once | ORAL | Status: AC
  Administered 2020-09-08: 1 mg via ORAL
  Filled 2020-09-08 (×2): qty 1

## 2020-09-08 NOTE — ED Notes (Signed)
Pt in CT at this time.

## 2020-09-08 NOTE — H&P (Signed)
History and Physical    Ruben [name] YNW:[id number] DOB: January 21, 1951 DOA: 09/08/2020  PCP: [name], MD   Patient coming from: Home   Chief Complaint: Wants help quitting alcohol, abdominal pain and distension, confusion   HPI: Ruben [name] is a 70 y.o. male with medical history significant for alcohol dependence, hypertension, insomnia, and pulmonary nodule, now presenting to the emergency department requesting help with alcohol cessation, also reports abdominal pain and distention, and EMS notes that a family member raise concern for increased confusion.  Patient notes that he has had issues with abdominal pain and distention for roughly 2 years, waxing and waning, and now bothering him again for the past couple days.  Reports that he attempted to avoid alcohol after he was hospitalized in March, but unfortunately began drinking again.  He reports consuming between 12 and 25 drinks, mainly vodka, each day.  He wants to achieve abstinence again but worries about withdrawal symptoms.  His last drink was yesterday.  Family member reported to EMS that patient seemed to be more confused.  Patient himself reports occasional confusion but denies any headache, change in vision or hearing, or focal numbness or weakness, and does not feel confused now in the emergency department.  He denies any fevers, chills, chest pain, or shortness of breath.  ED Course: Upon arrival to the ED, patient is found to be afebrile, saturating well on room air, afebrile, saturating well on room air, mildly tachycardic, and with stable blood pressure.  EKG features sinus tachycardia 314.  Head CT is negative for acute intracranial abnormality.  Chemistry panel notable for sodium 130, creatinine 2.02, AST 67, ALT 51, and bilirubin 2.1.  Ammonia is 47.  Ethanol undetectable.  Platelet count 63,000.  Patient was given IV fluids, thiamine, and folate in the ED.  Review of Systems:  All other systems reviewed and apart from  HPI, are negative.  Past Medical History:  Diagnosis Date  . Alcoholism ([place])   . Alcoholism ([place])   . Aortic atherosclerosis ([place]) 12/26/2016   Noted on CT  . Arthritis   . BPH (benign prostatic hyperplasia)   . Chronic constipation    takes magnesium  . Duodenal ulcer   . Dyspnea 12/17/2017  . ED (erectile dysfunction)   . Foley catheter in place    history of no longer in place  . GERD (gastroesophageal reflux disease)   . Heart palpitations    Occ  . Hiatal hernia   . History of adenomatous polyp of colon    tubular adenoma's 2005  . History of Helicobacter pylori infection    2008  . Hypercholesteremia   . Hypertension   . MR (mitral regurgitation)    Mild, noted on ECHO  . Pre-diabetes   . Pulmonary nodule 12/26/2016   noted on CT, 5 x 3 mm nodular opacity right upper lobe  . Rosacea   . Schatzki's ring    last dilated 02/ 2016  . Urinary retention    history of  . Varicocele 02/25/2014   Small to moderate left varicocele    Past Surgical History:  Procedure Laterality Date  . BALLOON DILATION N/A 06/08/2014   Procedure: BALLOON DILATION;  Surgeon: Garlan Fair, MD;  Location: [facility];  Service: Endoscopy;  Laterality: N/A;  . COLONOSCOPY WITH PROPOFOL N/A 06/08/2014   Procedure: COLONOSCOPY WITH PROPOFOL;  Surgeon: Garlan Fair, MD;  Location: [facility];  Service: Endoscopy;  Laterality: N/A;  . ESOPHAGOGASTRODUODENOSCOPY N/A 06/08/2014  Procedure: ESOPHAGOGASTRODUODENOSCOPY (EGD);  Surgeon: Garlan Fair, MD;  Location: [facility];  Service: Endoscopy;  Laterality: N/A;  . ESOPHAGOGASTRODUODENOSCOPY (EGD) WITH ESOPHAGEAL DILATION N/A 12/31/2012   Procedure: ESOPHAGOGASTRODUODENOSCOPY (EGD) WITH ESOPHAGEAL DILATION;  Surgeon: Garlan Fair, MD;  Location: [facility];  Service: Endoscopy;  Laterality: N/A;  . GREEN LIGHT LASER TURP (TRANSURETHRAL RESECTION OF PROSTATE N/A 12/28/2015   Procedure: GREEN LIGHT LASER TURP (TRANSURETHRAL  RESECTION OF PROSTATE;  Surgeon: Festus Aloe, MD;  Location[facility];  Service: Urology;  Laterality: N/A;    Social History:   reports that he has never smoked. He has never used smokeless tobacco. He reports current alcohol use. He reports current drug use. Drug: Marijuana.  No Known Allergies  Family History  Problem Relation Age of Onset  . Heart failure Mother   . Stroke Father      Prior to Admission medications   Medication Sig Start Date End Date Taking? Authorizing Provider  amLODipine (NORVASC) 5 MG tablet Take 1 tablet (5 mg total) by mouth daily. 12/30/19 12/29/20 Yes Mercy Riding, MD  atorvastatin (LIPITOR) 20 MG tablet Take 1 tablet (20 mg total) by mouth daily. 10/06/19 12/22/19 Yes Alma Friendly, MD  doxepin (SINEQUAN) 10 MG capsule Take 30 mg by mouth at bedtime as needed. 05/12/20  Yes [provider]  folic acid (FOLVITE) 1 MG tablet Take 1 tablet (1 mg total) by mouth daily. 06/29/20  Yes Bonnielee Haff, MD  gabapentin (NEURONTIN) 100 MG capsule Take 200 mg by mouth 3 (three) times daily.   Yes [provider]  magnesium citrate SOLN Take 296 mLs (1 Bottle total) by mouth daily as needed for severe constipation. 12/30/19  Yes Mercy Riding, MD  metoprolol tartrate (LOPRESSOR) 25 MG tablet Take 1 tablet (25 mg total) by mouth 2 (two) times daily. 10/06/19 12/22/19 Yes Alma Friendly, MD  Multiple Vitamin (MULTIVITAMIN WITH MINERALS) TABS tablet Take 1 tablet by mouth daily. 12/30/19  Yes Mercy Riding, MD  senna-docusate (SENOKOT-S) 8.6-50 MG tablet Take 1 tablet by mouth 2 (two) times daily as needed for moderate constipation. 12/30/19  Yes Mercy Riding, MD  thiamine 100 MG tablet Take 1 tablet (100 mg total) by mouth daily. 06/29/20  Yes Bonnielee Haff, MD  traZODone (DESYREL) 150 MG tablet Take 150 mg by mouth at bedtime. 06/04/20  Yes [provider]  polyethylene glycol (MIRALAX / GLYCOLAX) 17 g packet Take 17 g  by mouth 2 (two) times daily as needed for mild constipation. Patient not taking: Reported on 09/08/2020 12/30/19   Mercy Riding, MD    Physical Exam: Vitals:   09/08/20 2000 09/08/20 2030 09/08/20 2100 09/08/20 2213  BP: (!) 175/94 (!) 168/91 (!) 171/88 (!) 141/84  Pulse: (!) 108 65 77 (!) 106  Resp: (!) 9 11 12 14   Temp:      TempSrc:      SpO2: 99% 100% 100% 100%    Constitutional: NAD, calm  Eyes: PERTLA, lids and conjunctivae normal ENMT: Mucous membranes are moist. Posterior pharynx clear of any exudate or lesions.   Neck: normal, supple, no masses, no thyromegaly Respiratory: no wheezing, no crackles. No accessory muscle use.  Cardiovascular: S1 & S2 heard, regular rate and rhythm. No extremity edema.  Abdomen: Soft, mild distension, no rebound pain or guarding. Bowel sounds active.  Musculoskeletal: no clubbing / cyanosis. No joint deformity upper and lower extremities.   Skin: no significant rashes, lesions, ulcers.  Warm, dry, well-perfused. Neurologic: CN 2-12 grossly intact. Sensation intact. Moving all extremities.  Psychiatric: Alert and oriented to person, place, and situation. Pleasant and cooperative.    Labs and Imaging on Admission: I have personally reviewed following labs and imaging studies  CBC: Recent Labs  Lab 09/08/20 1729  WBC 6.1  NEUTROABS 4.5  HGB 13.5  HCT 40.7  MCV 93.6  PLT 63*   Basic Metabolic Panel: Recent Labs  Lab 09/08/20 1729  NA 130*  K 4.6  CL 95*  CO2 18*  GLUCOSE 104*  BUN 43*  CREATININE 2.02*  CALCIUM 8.9   GFR: CrCl cannot be calculated (Unknown ideal weight.). Liver Function Tests: Recent Labs  Lab 09/08/20 1729  AST 67*  ALT 51*  ALKPHOS 66  BILITOT 2.1*  PROT 7.5  ALBUMIN 4.2   No results for input(s): LIPASE, AMYLASE in the last 168 hours. Recent Labs  Lab 09/08/20 1729  AMMONIA 47*   Coagulation Profile: No results for input(s): INR, PROTIME in the last 168 hours. Cardiac Enzymes: No results  for input(s): CKTOTAL, CKMB, CKMBINDEX, TROPONINI in the last 168 hours. BNP (last 3 results) No results for input(s): PROBNP in the last 8760 hours. HbA1C: No results for input(s): HGBA1C in the last 72 hours. CBG: No results for input(s): GLUCAP in the last 168 hours. Lipid Profile: No results for input(s): CHOL, HDL, LDLCALC, TRIG, CHOLHDL, LDLDIRECT in the last 72 hours. Thyroid Function Tests: No results for input(s): TSH, T4TOTAL, FREET4, T3FREE, THYROIDAB in the last 72 hours. Anemia Panel: No results for input(s): VITAMINB12, FOLATE, FERRITIN, TIBC, IRON, RETICCTPCT in the last 72 hours. Urine analysis:    Component Value Date/Time   COLORURINE YELLOW 09/08/2020 2130   APPEARANCEUR CLEAR 09/08/2020 2130   LABSPEC 1.014 09/08/2020 2130   PHURINE 5.0 09/08/2020 2130   GLUCOSEU NEGATIVE 09/08/2020 2130   HGBUR SMALL (A) 09/08/2020 2130   BILIRUBINUR NEGATIVE 09/08/2020 2130   KETONESUR 20 (A) 09/08/2020 2130   PROTEINUR NEGATIVE 09/08/2020 2130   NITRITE NEGATIVE 09/08/2020 2130   LEUKOCYTESUR NEGATIVE 09/08/2020 2130   Sepsis Labs: @LABRCNTIP (procalcitonin:4,lacticidven:4) )No results found for this or any previous visit (from the past 240 hour(s)).   Radiological Exams on Admission: CT Head Wo Contrast  Result Date: 09/08/2020 CLINICAL DATA:  Delirium, weakness, confusion, worsened symptoms, history of hypertension EXAM: CT HEAD WITHOUT CONTRAST TECHNIQUE: Contiguous axial images were obtained from the base of the skull through the vertex without intravenous contrast. Sagittal and coronal MPR images reconstructed from axial data set. COMPARISON:  12/23/2019 FINDINGS: Brain: Minimal atrophy. Normal ventricular morphology. No midline shift or mass effect. Minimal small vessel chronic ischemic changes of deep cerebral white matter. No intracranial hemorrhage, mass lesion, or evidence of acute infarction. No extra-axial fluid collections. Vascular: No hyperdense vessels. Minimal  atherosclerotic calcification of internal carotid arteries at skull base. Skull: Intact Sinuses/Orbits: Chronic opacity in RIGHT frontal sinus. Remaining paranasal sinuses and mastoid air cells clear. Other: N/A IMPRESSION: Minimal atrophy and small vessel chronic ischemic changes of deep cerebral white matter. No acute intracranial abnormalities. Electronically Signed   By: Lavonia Dana M.D.   On: 09/08/2020 17:27    EKG: Independently reviewed. Sinus tachycardia, rate 114, QTc 452 ms.   Assessment/Plan   1. Acute kidney injury  - SCr is 2.02 on admission, up from 1.01 a week earlier  - Check renal US and urine albumin-creatinine ratio, renally-dose medications, hydrate with IVF, repeat serum chemistries in am    2. Alcohol  dependence  - Patient reports 12-25 drinks daily, wants to quit, last drink was 5/31  - Monitor with CIWA, use Ativan as needed, supplement vitamins, consult TOC for any available resources    3. Thrombocytopenia  - Platelets 63k on admission without apparent infection, likely related to alcoholism - Monitor   4. Hyponatremia  - Serum sodium is 130 on admission  - Continue IVF hydration with NS, repeat chem panel in am   5. Elevated LFTs  - Mild elevation in transaminases and bilirubin noted  - He had negative viral hepatitis testing recently  - Trend LFTs, hold statin for now, check abd [place], and encourage alcohol cessation   6. Hypertension  - Continue Lopressor and Norvasc    7. Depression; insomnia  - Continue sinequan and trazodone    DVT prophylaxis: SCDs  Code Status: Full  Level of Care: Level of care: Telemetry Family Communication: None available  Disposition Plan:  Patient is from: Home  Anticipated d/c is to: TBD Anticipated d/c date is: 09/11/20 Patient currently: Pending improvement in renal function, monitoring of/treatment for EtOH withdrawal  Consults called: None  Admission status: Inpatient    Vianne Bulls, MD [facility]  09/08/2020, 11:43 PM

## 2020-09-08 NOTE — ED Notes (Signed)
Provider at the bedside to evaluate. 

## 2020-09-08 NOTE — ED Notes (Signed)
Pt is a difficult stick. Blood work obtained and sent to lab however no patent peripheral IV at this time. IV consult placed.

## 2020-09-08 NOTE — ED Notes (Signed)
Pt back from CT at this time 

## 2020-09-08 NOTE — ED Notes (Signed)
Report given to Felicia, RN

## 2020-09-08 NOTE — ED Triage Notes (Signed)
Pt presents to the ED via EMS from home for abd pain and reportedly confusion from, per the pt's family. Per EMS, pt may have consumed EtOH and takes RX medications that may be interfering with his mentation. EMS states the pt c/o abd distention and pain which has been ongoing for two years. Pt reports "feeling confused at times."

## 2020-09-08 NOTE — ED Provider Notes (Signed)
[facility] DEPT Provider Note   CSN: [id number] Arrival date & time: 09/08/20  1602     History Chief Complaint  Patient presents with  . Abdominal Pain    Ruben [name] is a 70 y.o. male.  HPI Patient here today because of problems associated with heavy alcohol use.  He states he drinks a pint to 1/5 of vodka every day.  Today he called the police because he thought someone was in his car outside his home but there was no one there.  He also occasionally sees things that are not there such as people in his home.  He lives alone.  He does his own driving to get food.  He has a primary care doctor that he sees occasionally.  He has been treated with gabapentin for paresthesia.  He states when he stops drinking he gets shaky.  He has never had DTs.  He denies fever, chills, cough, shortness of breath, weakness or dizziness.  Patient was admitted to another health system, about 1 month ago, hospitalized for 1 day, and admitted to a psychiatric facility for 5 days.  He reports that he was clean for about a week then started drinking again because he was having trouble sleeping.  He feels like he wants to go through detoxification again.  There are no other known active modifying factors.    Past Medical History:  Diagnosis Date  . Alcoholism ([place])   . Alcoholism ([place])   . Aortic atherosclerosis ([place]) 12/26/2016   Noted on CT  . Arthritis   . BPH (benign prostatic hyperplasia)   . Chronic constipation    takes magnesium  . Duodenal ulcer   . Dyspnea 12/17/2017  . ED (erectile dysfunction)   . Foley catheter in place    history of no longer in place  . GERD (gastroesophageal reflux disease)   . Heart palpitations    Occ  . Hiatal hernia   . History of adenomatous polyp of colon    tubular adenoma's 2005  . History of Helicobacter pylori infection    2008  . Hypercholesteremia   . Hypertension   . MR (mitral regurgitation)    Mild, noted on ECHO   . Pre-diabetes   . Pulmonary nodule 12/26/2016   noted on CT, 5 x 3 mm nodular opacity right upper lobe  . Rosacea   . Schatzki's ring    last dilated 02/ 2016  . Urinary retention    history of  . Varicocele 02/25/2014   Small to moderate left varicocele    Patient Active Problem List   Diagnosis Date Noted  . Sinus tachycardia 08/16/2020  . Palpitations 08/16/2020  . Exertional dyspnea 08/16/2020  . Mild renal insufficiency 06/23/2020  . Weight loss 04/08/2020  . Unsteady gait 04/08/2020  . Insomnia 04/08/2020  . Lump of skin of left upper extremity 04/08/2020  . Irregular heart beat 04/08/2020  . Pure hypercholesterolemia 04/08/2020  . Alcohol use disorder, severe, dependence ([place]) 04/03/2020  . Hyperlipidemia 04/03/2020  . Suicidal ideation 04/03/2020  . Metabolic acidosis 69/79/4801  . Thrombocytopenia ([place]) 12/22/2019  . ARF (acute renal failure) ([place]) 10/01/2019  . Alcohol withdrawal ([place]) 10/01/2019  . Normochromic normocytic anemia 10/01/2019  . GERD (gastroesophageal reflux disease) 06/22/2019  . BPH (benign prostatic hyperplasia) 06/22/2019  . Essential hypertension 06/22/2019  . AKI (acute kidney injury) ([place])   . Alcohol intoxication ([place]) 06/21/2019  . Shoulder joint pain 03/31/2019  . S/P Nissen fundoplication (without  gastrostomy tube) procedure 04/26/2018  . Rosacea 06/24/2011    Past Surgical History:  Procedure Laterality Date  . BALLOON DILATION N/A 06/08/2014   Procedure: BALLOON DILATION;  Surgeon: Garlan Fair, MD;  Location: [facility];  Service: Endoscopy;  Laterality: N/A;  . COLONOSCOPY WITH PROPOFOL N/A 06/08/2014   Procedure: COLONOSCOPY WITH PROPOFOL;  Surgeon: Garlan Fair, MD;  Location: [facility];  Service: Endoscopy;  Laterality: N/A;  . ESOPHAGOGASTRODUODENOSCOPY N/A 06/08/2014   Procedure: ESOPHAGOGASTRODUODENOSCOPY (EGD);  Surgeon: Garlan Fair, MD;  Location: [facility];  Service: Endoscopy;  Laterality: N/A;  .  ESOPHAGOGASTRODUODENOSCOPY (EGD) WITH ESOPHAGEAL DILATION N/A 12/31/2012   Procedure: ESOPHAGOGASTRODUODENOSCOPY (EGD) WITH ESOPHAGEAL DILATION;  Surgeon: Garlan Fair, MD;  Location: [facility];  Service: Endoscopy;  Laterality: N/A;  . GREEN LIGHT LASER TURP (TRANSURETHRAL RESECTION OF PROSTATE N/A 12/28/2015   Procedure: GREEN LIGHT LASER TURP (TRANSURETHRAL RESECTION OF PROSTATE;  Surgeon: Festus Aloe, MD;  Location: [facility];  Service: Urology;  Laterality: N/A;       Family History  Problem Relation Age of Onset  . Heart failure Mother   . Stroke Father     Social History   Tobacco Use  . Smoking status: Never Smoker  . Smokeless tobacco: Never Used  Vaping Use  . Vaping Use: Never used  Substance Use Topics  . Alcohol use: Yes    Comment: 12 to 18 ounces vodka daily  . Drug use: Yes    Types: Marijuana    Home Medications Prior to Admission medications   Medication Sig Start Date End Date Taking? Authorizing Provider  amLODipine (NORVASC) 5 MG tablet Take 1 tablet (5 mg total) by mouth daily. 12/30/19 12/29/20  Mercy Riding, MD  atorvastatin (LIPITOR) 20 MG tablet Take 1 tablet (20 mg total) by mouth daily. 10/06/19 12/22/19  Alma Friendly, MD  chlordiazePOXIDE (LIBRIUM) 25 MG capsule 25 mg PO TID x 1 day, then 25 PO BID X 1 day, then 25 mg PO QD X 1 day 08/25/20   Lajean Saver, MD  doxepin (SINEQUAN) 10 MG capsule Take 30 mg by mouth at bedtime. 05/12/20   [provider]  folic acid (FOLVITE) 1 MG tablet Take 1 tablet (1 mg total) by mouth daily. 06/29/20   Bonnielee Haff, MD  Magnesium 400 MG TABS Take 1,600 mg by mouth daily.    [provider]  magnesium citrate SOLN Take 296 mLs (1 Bottle total) by mouth daily as needed for severe constipation. 12/30/19   Mercy Riding, MD  metoprolol tartrate (LOPRESSOR) 25 MG tablet Take 1 tablet (25 mg total) by mouth 2 (two) times daily. Patient taking differently: Take 50 mg by  mouth 2 (two) times daily. 10/06/19 12/22/19  Alma Friendly, MD  Multiple Vitamin (MULTIVITAMIN WITH MINERALS) TABS tablet Take 1 tablet by mouth daily. 12/30/19   Mercy Riding, MD  polyethylene glycol (MIRALAX / GLYCOLAX) 17 g packet Take 17 g by mouth 2 (two) times daily as needed for mild constipation. 12/30/19   Mercy Riding, MD  senna-docusate (SENOKOT-S) 8.6-50 MG tablet Take 1 tablet by mouth 2 (two) times daily as needed for moderate constipation. 12/30/19   Mercy Riding, MD  thiamine 100 MG tablet Take 1 tablet (100 mg total) by mouth daily. 06/29/20   Bonnielee Haff, MD  traZODone (DESYREL) 150 MG tablet Take 150 mg by mouth at bedtime. 06/04/20   [provider]    Allergies  Patient has no known allergies.  Review of Systems   Review of Systems  All other systems reviewed and are negative.   Physical Exam Updated Vital Signs BP (!) 171/88   Pulse 77   Temp 98.7 F (37.1 C) (Oral)   Resp 12   SpO2 100%   Physical Exam Vitals and nursing note reviewed.  Constitutional:      General: He is not in acute distress.    Appearance: He is well-developed. He is not ill-appearing or toxic-appearing.  HENT:     Head: Normocephalic and atraumatic.     Right Ear: External ear normal.     Left Ear: External ear normal.  Eyes:     Conjunctiva/sclera: Conjunctivae normal.     Pupils: Pupils are equal, round, and reactive to light.  Neck:     Trachea: Phonation normal.  Cardiovascular:     Rate and Rhythm: Normal rate and regular rhythm.     Heart sounds: Normal heart sounds.  Pulmonary:     Effort: Pulmonary effort is normal.     Breath sounds: Normal breath sounds.  Abdominal:     General: There is no distension.     Palpations: Abdomen is soft.     Tenderness: There is no abdominal tenderness.  Musculoskeletal:        General: Normal range of motion.     Cervical back: Normal range of motion and neck supple.  Skin:    General: Skin is warm and dry.   Neurological:     Mental Status: He is alert and oriented to person, place, and time.     Cranial Nerves: No cranial nerve deficit.     Sensory: No sensory deficit.     Motor: No abnormal muscle tone.     Coordination: Coordination normal.     Comments: No dysarthria or aphasia.  Psychiatric:        Attention and Perception: He is inattentive.        Mood and Affect: Mood normal.        Behavior: Behavior normal.        Thought Content: Thought content is delusional.        Cognition and Memory: Cognition is impaired.        Judgment: Judgment is inappropriate.     ED Results / Procedures / Treatments   Labs (all labs ordered are listed, but only abnormal results are displayed) Labs Reviewed  COMPREHENSIVE METABOLIC PANEL - Abnormal; Notable for the following components:      Result Value   Sodium 130 (*)    Chloride 95 (*)    CO2 18 (*)    Glucose, Bld 104 (*)    BUN 43 (*)    Creatinine, Ser 2.02 (*)    AST 67 (*)    ALT 51 (*)    Total Bilirubin 2.1 (*)    GFR, Estimated 35 (*)    Anion gap 17 (*)    All other components within normal limits  CBC WITH DIFFERENTIAL/PLATELET - Abnormal; Notable for the following components:   Platelets 63 (*)    All other components within normal limits  AMMONIA - Abnormal; Notable for the following components:   Ammonia 47 (*)    All other components within normal limits  URINALYSIS, ROUTINE W REFLEX MICROSCOPIC - Abnormal; Notable for the following components:   Hgb urine dipstick SMALL (*)    Ketones, ur 20 (*)    All other components within normal limits  SARS CORONAVIRUS 2 (TAT 6-24 HRS)  ETHANOL  RAPID URINE DRUG SCREEN, HOSP PERFORMED    EKG EKG Interpretation  Date/Time:  Wednesday September 08 2020 16:24:57 EDT Ventricular Rate:  114 PR Interval:  155 QRS Duration: 88 QT Interval:  328 QTC Calculation: 452 R Axis:   -10 Text Interpretation: Sinus tachycardia Probable left atrial enlargement since last tracing no  significant change Confirmed by Daleen Bo [id number]) on 09/08/2020 5:43:32 PM   Radiology CT Head Wo Contrast  Result Date: 09/08/2020 CLINICAL DATA:  Delirium, weakness, confusion, worsened symptoms, history of hypertension EXAM: CT HEAD WITHOUT CONTRAST TECHNIQUE: Contiguous axial images were obtained from the base of the skull through the vertex without intravenous contrast. Sagittal and coronal MPR images reconstructed from axial data set. COMPARISON:  12/23/2019 FINDINGS: Brain: Minimal atrophy. Normal ventricular morphology. No midline shift or mass effect. Minimal small vessel chronic ischemic changes of deep cerebral white matter. No intracranial hemorrhage, mass lesion, or evidence of acute infarction. No extra-axial fluid collections. Vascular: No hyperdense vessels. Minimal atherosclerotic calcification of internal carotid arteries at skull base. Skull: Intact Sinuses/Orbits: Chronic opacity in RIGHT frontal sinus. Remaining paranasal sinuses and mastoid air cells clear. Other: N/A IMPRESSION: Minimal atrophy and small vessel chronic ischemic changes of deep cerebral white matter. No acute intracranial abnormalities. Electronically Signed   By: Lavonia Dana M.D.   On: 09/08/2020 17:27    Procedures .Critical Care Performed by: Daleen Bo, MD Authorized by: Daleen Bo, MD   Critical care provider statement:    Critical care time (minutes):  50   Critical care start time:  09/08/2020 4:20 PM   Critical care end time:  09/08/2020 10:33 PM   Critical care time was exclusive of:  Separately billable procedures and treating other patients   Critical care was necessary to treat or prevent imminent or life-threatening deterioration of the following conditions:  Metabolic crisis (Alcoholism)   Critical care was time spent personally by me on the following activities:  Blood draw for specimens, development of treatment plan with patient or surrogate, discussions with consultants, evaluation of  patient's response to treatment, examination of patient, obtaining history from patient or surrogate, ordering and performing treatments and interventions, ordering and review of laboratory studies, pulse oximetry, re-evaluation of patient's condition, review of old charts and ordering and review of radiographic studies     Medications Ordered in ED Medications  0.9 %  sodium chloride infusion (1,000 mLs Intravenous New Bag/Given 09/08/20 2033)  sodium chloride 0.9 % bolus 500 mL (0 mLs Intravenous Stopped 06/14/62 4034)  folic acid (FOLVITE) tablet 1 mg (1 mg Oral Given 09/08/20 2112)  thiamine (B-1) 300 mg in sodium chloride 0.9 % 50 mL IVPB (0 mg Intravenous Stopped 09/08/20 2020)    ED Course  I have reviewed the triage vital signs and the nursing notes.  Pertinent labs & imaging results that were available during my care of the patient were reviewed by me and considered in my medical decision making (see chart for details).    MDM Rules/Calculators/A&P                           Patient Vitals for the past 24 hrs:  BP Temp Temp src Pulse Resp SpO2  09/08/20 2100 (!) 171/88 -- -- 77 12 100 %  09/08/20 2030 (!) 168/91 -- -- 65 11 100 %  09/08/20 2000 (!) 175/94 -- -- (!) 108 (!) 9 99 %  09/08/20 1945 136/81 -- -- 70 11 100 %  09/08/20 1930 (!) 157/92 -- -- 74 13 97 %  09/08/20 1900 (!) 159/87 -- -- 76 10 100 %  09/08/20 1809 140/84 -- -- 78 16 99 %  09/08/20 1628 135/87 98.7 F (37.1 C) Oral (!) 116 20 100 %  09/08/20 1608 -- -- -- -- -- 98 %    10:09 PM Reevaluation with update and discussion. After initial assessment and treatment, an updated evaluation reveals he has a now, is calm and comfortable.  No tremor.  No overt confusion or psychosis.  He is not expressing any loose [place] at this time. Daleen Bo   Medical Decision Making:  This patient is presenting for evaluation of complications from heavy alcohol use, which does require a range of treatment options, and is a  complaint that involves a high risk of morbidity and mortality. The differential diagnoses include chronic alcoholism, metabolic disorder, complications from alcoholism. I decided to review old records, and in summary elderly male presenting with nonspecific symptoms mostly balance difficulty, and some hallucinations.  I did not require additional historical information from anyone.  Clinical Laboratory Tests Ordered, included CBC, Metabolic panel, Urinalysis and Alcohol level. Review indicates normal except ammonia slightly elevated, sodium low, chloride low, CO2 low, glucose high, BUN high, creatinine high, AST high, ALT high, total bilirubin high, GFR low, anion gap high. Radiologic Tests Ordered, included CT head.  I independently Visualized: Radiograph images, which show no acute abnormalities   Critical Interventions-clinical evaluation, after testing, radiography, IV fluids, medication treatment, observation and reassess  After These Interventions, the Patient was reevaluated and was found stable, with metabolic disorder likely related to poor nutrition associated with alcoholism and heavy alcohol use.  No distinct evidence for alcohol withdrawal syndrome however hallucinations are concerning for early DTs.  He does not have a psychiatric history.  CRITICAL CARE- Yes  Performed by: Daleen Bo  Nursing Notes Reviewed/ Care Coordinated Applicable Imaging Reviewed Interpretation of Laboratory Data incorporated into ED treatment  10:14 PM-Consult complete with Hospitalist. Patient case explained and discussed. He agrees to admit patient for further evaluation and treatment. Call ended at 10:33 PM    Final Clinical Impression(s) / ED Diagnoses Final diagnoses:  Alcoholism ([place])  Hallucinations  AKI (acute kidney injury) ([place])  Hyponatremia  Hyperbilirubinemia    Rx / DC Orders ED Discharge Orders    None       Daleen Bo, MD 09/08/20 2234

## 2020-09-08 NOTE — ED Notes (Signed)
Patient is asleep.  

## 2020-09-08 NOTE — ED Notes (Signed)
Patient ambulated to the edge of the bed to use the bathroom. Urine sample collected. Food provided at patients request.

## 2020-09-08 NOTE — ED Notes (Signed)
IV team at bedside 

## 2020-09-09 ENCOUNTER — Inpatient Hospital Stay (HOSPITAL_COMMUNITY): Payer: Medicare Other

## 2020-09-09 ENCOUNTER — Other Ambulatory Visit: Payer: Self-pay

## 2020-09-09 DIAGNOSIS — R7989 Other specified abnormal findings of blood chemistry: Secondary | ICD-10-CM | POA: Insufficient documentation

## 2020-09-09 LAB — COMPREHENSIVE METABOLIC PANEL
ALT: 46 U/L — ABNORMAL HIGH (ref 0–44)
AST: 42 U/L — ABNORMAL HIGH (ref 15–41)
Albumin: 3.6 g/dL (ref 3.5–5.0)
Alkaline Phosphatase: 57 U/L (ref 38–126)
Anion gap: 11 (ref 5–15)
BUN: 35 mg/dL — ABNORMAL HIGH (ref 8–23)
CO2: 24 mmol/L (ref 22–32)
Calcium: 8.6 mg/dL — ABNORMAL LOW (ref 8.9–10.3)
Chloride: 99 mmol/L (ref 98–111)
Creatinine, Ser: 1.29 mg/dL — ABNORMAL HIGH (ref 0.61–1.24)
GFR, Estimated: >60 mL/min (ref >60)
Glucose, Bld: 99 mg/dL (ref 70–99)
Potassium: 3.2 mmol/L — ABNORMAL LOW (ref 3.5–5.1)
Sodium: 134 mmol/L — ABNORMAL LOW (ref 135–145)
Total Bilirubin: 1 mg/dL (ref 0.3–1.2)
Total Protein: 6.4 g/dL — ABNORMAL LOW (ref 6.5–8.1)

## 2020-09-09 LAB — URINALYSIS, COMPLETE (UACMP) WITH MICROSCOPIC
Bilirubin Urine: NEGATIVE
Glucose, UA: NEGATIVE mg/dL
Ketones, ur: 20 mg/dL — AB
Leukocytes,Ua: NEGATIVE
Nitrite: NEGATIVE
Protein, ur: NEGATIVE mg/dL
Specific Gravity, Urine: 1.012 (ref 1.005–1.030)
pH: 6 (ref 5.0–8.0)

## 2020-09-09 LAB — MAGNESIUM: Magnesium: 2.1 mg/dL (ref 1.7–2.4)

## 2020-09-09 LAB — CBC
HCT: 37.5 % — ABNORMAL LOW (ref 39.0–52.0)
Hemoglobin: 12.8 g/dL — ABNORMAL LOW (ref 13.0–17.0)
MCH: 30.8 pg (ref 26.0–34.0)
MCHC: 34.1 g/dL (ref 30.0–36.0)
MCV: 90.1 fL (ref 80.0–100.0)
Platelets: 61 10*3/uL — ABNORMAL LOW (ref 150–400)
RBC: 4.16 MIL/uL — ABNORMAL LOW (ref 4.22–5.81)
RDW: 13.8 % (ref 11.5–15.5)
WBC: 4.3 10*3/uL (ref 4.0–10.5)
nRBC: 0 % (ref 0.0–0.2)

## 2020-09-09 LAB — PHOSPHORUS: Phosphorus: 2.8 mg/dL (ref 2.5–4.6)

## 2020-09-09 LAB — SARS CORONAVIRUS 2 (TAT 6-24 HRS): SARS Coronavirus 2: NEGATIVE

## 2020-09-09 LAB — AMMONIA: Ammonia: 17 umol/L (ref 9–35)

## 2020-09-09 MED ORDER — AMMONIA AROMATIC IN INHA: 1.0000 | Freq: Once | RESPIRATORY_TRACT | Status: DC

## 2020-09-09 MED ORDER — POTASSIUM CHLORIDE CRYS ER 10 MEQ PO TBCR
40.0000 meq | EXTENDED_RELEASE_TABLET | Freq: Two times a day (BID) | ORAL | Status: AC
  Administered 2020-09-09 (×2): 40 meq via ORAL
  Filled 2020-09-09: qty 2
  Filled 2020-09-09: qty 4

## 2020-09-09 NOTE — ED Notes (Signed)
Report sent to floor nurse.  

## 2020-09-09 NOTE — Progress Notes (Signed)
PROGRESS NOTE    Ruben [name]  IOE:[id number] DOB: Aug 05, 1950 DOA: 09/08/2020 PCP: [name], MD   Brief Narrative:  The patient is a 70 year old male with a past medical history significant for but not limited to alcohol dependence, hypertension, insomnia, history of pulmonary nodule as well as other comorbidities who presented to the emergency department requesting help with alcohol cessation as well as reporting abdominal pain and distention.  EMS notes that family risk and return for increased confusion as well and patient notes that he has issues with abdominal pain and distention for roughly 2 years that is waxing and waning but has not been bothering him for last 2 days.  He had reported that he attempted avoid alcohol after his hospitalized in March before she began drinking again and reports that he could been consuming 12-25 drinks daily mainly vodka each day.  He wants to achieve abstinence and be seen but worries about withdrawal symptoms and his last drink was 09/07/2020.  Family member reported to EMS that he seemed more confused and patient reports occasional confusion but denies any headaches changes in vision or hearing.  Upon arrival to the ED he was found to be afebrile saturating well on room air he is mildly tachycardic with a stable blood pressure.  Head CT was negative for any acute intracranial abnormality CMP was done which showed an elevated creatinine and low sodium.  Patient's ammonia level is also 47.  Platelet count was on the lower side.  He was admitted for acute kidney injury as well as alcohol dependence with concern for withdrawal and currently getting IV fluid hydration and being placed on a CIWA protocol.  Assessment & Plan:   Principal Problem:   Acute kidney injury [facility]) Active Problems:   Thrombocytopenia (HCC)   Alcohol use disorder, severe, dependence (HCC)   Hyponatremia   Hyperbilirubinemia  AKI -Improving. Patient's BUN/Cr went from 43/2.02 ->  35/1.29 -C/w IVF with NS at 125 mL/hr -Avoid Nephrotoxic Medications, Contrast Dyes, Hypotension, and Renally Adjust Medications -Continue to Monitor and Trend Renal Fxn Carefully -Repeat CMP in the AM   Abnormal LFT's/Elevated LFTs -In the setting of Alcohol Abuse -Improving. Patient's AST went from 67 -> 42 and ALT went from 51 -> 46 -Continue to Monitor and Trend Hepatic Fxn Panel and if Necessary will obtain a RUQ U/S and an Acute Hepatitis Panel -Repeat CMP in the AM  Normocytic Anemia -Patient's Hgb/Hct went from 13.5/40.7 -> 12.8/37.5 -Check Anemia Panel in the AM -Continue to Monitor for S/Sx of Bleeding; Currently no overt bleeding noted -Repeat CBC in the AM   Thrombocytopenia -Patient's Platelet Count has gone from 182 on 08/25/20 to 63 yesterday and to 61 today -Continue to Monitor for S/Sx of Bleeding; No overt bleeding noted -Repeat CBC in the AM   Hyperbilirubinemia -Improved. T Bili went from 2.1 -> 1.0 -Continue to Monitor and Trend -Repeat CMP in the AM   Hyponatremia -Mild and Improving now -Na+ went from 130 -> 134 -C/w IVF Hydration with NS at 125 mL/hr -Repeat CMP in the AM   Hypokalemia -Mild. K+ went from 4.6 -> 3.2 -Replete with po KCl 40 mEQ BID x2 -Mag Level was 2.1 -Continue to Monitor and Replete as Necessary -Repeat CMP in the AM   Alcohol Abuse and Dependence with Concern for Withdrawal -Last time he was in the ED EtOH level was 303; This visit was <10 -Reportedly drinks 12-25 drinks daily and wants to quit; Last Drink was  5/31 -C/w IVF Hydration with NS at 125 mL/hr x 18 hours -Placed on CIWA Protocol with po/IV Lorazepam 0-4 mg  q6h followed by q12h and 1-4 mg po/IV E3MOQH  -C/w Folic Acid 1 mg po Daily, MVI+Minerals 1 tab po Daily and Thiamine 100 m po/IV Daily  HTN -C/w Amlodipine 5 mg po daily and with Metoprolol Tartrate 25 mg po BID -Continue to Monitor BP per Protocol -Last BP was 139/92  Depression/Anxiety and Insomnia -C/w  Doxepin 30 mg po qHS and Trazodone 150 mg po qHS  DVT prophylaxis: SCDs given Thrombocytopenia  Code Status: FULL CODE  Family Communication: No family present at bedside  Disposition Plan: Pending further clinical Improvement  Status is: Inpatient  Remains inpatient appropriate because:Unsafe d/c plan, IV treatments appropriate due to intensity of illness or inability to take PO and Inpatient level of care appropriate due to severity of illness   Dispo: The patient is from: Home              Anticipated d/c is to: TBD              Patient currently is not medically stable to d/c.   Difficult to place patient No  Consultants:   None   Procedures: None  Antimicrobials:  Anti-infectives (From admission, onward)   None        Subjective: Seen and examined at bedside he is little bit confused and admits to some mild abdominal pain.  No nausea or vomiting.  No other concerns or complaints at this time.  Objective: Vitals:   09/09/20 0600 09/09/20 0630 09/09/20 0739 09/09/20 0802  BP: (!) 144/77 113/77 124/80 131/73  Pulse: 72 (!) 59 (!) 57 64  Resp: 17 20 18 16   Temp:      TempSrc:      SpO2: 97% 92% 99% 99%    Intake/Output Summary (Last 24 hours) at 09/09/2020 0821 Last data filed at 09/09/2020 0041 Gross per 24 hour  Intake 1363.12 ml  Output 800 ml  Net 563.12 ml   There were no vitals filed for this visit.  Examination: Physical Exam:  Constitutional: WN/WD Caucasian male in NAD and appears calm and comfortable Eyes: Lids and conjunctivae normal, sclerae anicteric  ENMT: External Ears, Nose appear normal. Grossly normal hearing.  Neck: Appears normal, supple, no cervical masses, normal ROM, no appreciable thyromegaly; no JVD Respiratory: Diminished to auscultation bilaterally, no wheezing, rales, rhonchi or crackles. Normal respiratory effort and patient is not tachypenic. No accessory muscle use. Unlabored breathing  Cardiovascular: RRR, no murmurs / rubs /  gallops. S1 and S2 auscultated. Minimal edema Abdomen: Soft, non-tender, Distended 2/2 body habitus. Bowel sounds positive.  GU: Deferred. Musculoskeletal: No clubbing / cyanosis of digits/nails. No joint deformity upper and lower extremities.  Skin: No rashes, lesions, ulcers on a limited skin evaluation. No induration; Warm and dry.  Neurologic: CN 2-12 grossly intact with no focal deficits. Romberg sign and cerebellar reflexes not assessed.  Psychiatric: Normal judgment and insight. Awake but a little confused. Normal mood and appropriate affect.   Data Reviewed: I have personally reviewed following labs and imaging studies  CBC: Recent Labs  Lab 09/08/20 1729 09/09/20 0415  WBC 6.1 4.3  NEUTROABS 4.5  --   HGB 13.5 12.8*  HCT 40.7 37.5*  MCV 93.6 90.1  PLT 63* 61*   Basic Metabolic Panel: Recent Labs  Lab 09/08/20 1729 09/09/20 0415  NA 130* 134*  K 4.6 3.2*  CL 95* 99  CO2 18* 24  GLUCOSE 104* 99  BUN 43* 35*  CREATININE 2.02* 1.29*  CALCIUM 8.9 8.6*  MG  --  2.1  PHOS  --  2.8   GFR: CrCl cannot be calculated (Unknown ideal weight.). Liver Function Tests: Recent Labs  Lab 09/08/20 1729 09/09/20 0415  AST 67* 42*  ALT 51* 46*  ALKPHOS 66 57  BILITOT 2.1* 1.0  PROT 7.5 6.4*  ALBUMIN 4.2 3.6   No results for input(s): LIPASE, AMYLASE in the last 168 hours. Recent Labs  Lab 09/08/20 1729  AMMONIA 47*   Coagulation Profile: No results for input(s): INR, PROTIME in the last 168 hours. Cardiac Enzymes: No results for input(s): CKTOTAL, CKMB, CKMBINDEX, TROPONINI in the last 168 hours. BNP (last 3 results) No results for input(s): PROBNP in the last 8760 hours. HbA1C: No results for input(s): HGBA1C in the last 72 hours. CBG: No results for input(s): GLUCAP in the last 168 hours. Lipid Profile: No results for input(s): CHOL, HDL, LDLCALC, TRIG, CHOLHDL, LDLDIRECT in the last 72 hours. Thyroid Function Tests: No results for input(s): TSH, T4TOTAL,  FREET4, T3FREE, THYROIDAB in the last 72 hours. Anemia Panel: No results for input(s): VITAMINB12, FOLATE, FERRITIN, TIBC, IRON, RETICCTPCT in the last 72 hours. Sepsis Labs: No results for input(s): PROCALCITON, LATICACIDVEN in the last 168 hours.  Recent Results (from the past 240 hour(s))  SARS CORONAVIRUS 2 (TAT 6-24 HRS) Nasopharyngeal Nasopharyngeal Swab     Status: None   Collection Time: 09/08/20 10:18 PM   Specimen: Nasopharyngeal Swab  Result Value Ref Range Status   SARS Coronavirus 2 NEGATIVE NEGATIVE Final    Comment: (NOTE) SARS-CoV-2 target nucleic acids are NOT DETECTED.  The SARS-CoV-2 RNA is generally detectable in upper and lower respiratory specimens during the acute phase of infection. Negative results do not preclude SARS-CoV-2 infection, do not rule out co-infections with other pathogens, and should not be used as the sole basis for treatment or other patient management decisions. Negative results must be combined with clinical observations, patient history, and epidemiological information. The expected result is Negative.  Fact Sheet for Patients: SugarRoll.be  Fact Sheet for Healthcare Providers: https://www.woods-mathews.com/  This test is not yet approved or cleared by the [place] FDA and  has been authorized for detection and/or diagnosis of SARS-CoV-2 by FDA under an Emergency Use Authorization (EUA). This EUA will remain  in effect (meaning this test can be used) for the duration of the COVID-19 declaration under Se ction 564(b)(1) of the Act, 21 U.S.C. section 360bbb-3(b)(1), unless the authorization is terminated or revoked sooner.  Performed at [facility], [place] [address] [place], [place] [address]     RN Pressure Injury Documentation:     Estimated body mass index is 29.46 kg/m as calculated from the following:   Height as of 08/25/20: 5\' 11"  (1.803 m).   Weight as of 08/25/20: 95.8  kg.  Malnutrition Type:   Malnutrition Characteristics:   Nutrition Interventions:    Radiology Studies: CT Head Wo Contrast  Result Date: 09/08/2020 CLINICAL DATA:  Delirium, weakness, confusion, worsened symptoms, history of hypertension EXAM: CT HEAD WITHOUT CONTRAST TECHNIQUE: Contiguous axial images were obtained from the base of the skull through the vertex without intravenous contrast. Sagittal and coronal MPR images reconstructed from axial data set. COMPARISON:  12/23/2019 FINDINGS: Brain: Minimal atrophy. Normal ventricular morphology. No midline shift or mass effect. Minimal small vessel chronic ischemic changes of deep cerebral white matter. No intracranial hemorrhage, mass  lesion, or evidence of acute infarction. No extra-axial fluid collections. Vascular: No hyperdense vessels. Minimal atherosclerotic calcification of internal carotid arteries at skull base. Skull: Intact Sinuses/Orbits: Chronic opacity in RIGHT frontal sinus. Remaining paranasal sinuses and mastoid air cells clear. Other: N/A IMPRESSION: Minimal atrophy and small vessel chronic ischemic changes of deep cerebral white matter. No acute intracranial abnormalities. Electronically Signed   By: Lavonia Dana M.D.   On: 09/08/2020 17:27   US Abdomen Complete  Result Date: 09/09/2020 CLINICAL DATA:  Abdominal distension, elevated liver function tests, acute renal insufficiency EXAM: ABDOMEN ULTRASOUND COMPLETE COMPARISON:  10/01/2019 FINDINGS: Gallbladder: No gallstones or wall thickening visualized. No sonographic Murphy sign noted by sonographer. Common bile duct: Diameter: 3 mm Liver: Diffuse increased liver echotexture consistent with hepatic steatosis. No focal liver abnormality. No biliary duct dilation. Portal vein is patent on color Doppler imaging with normal direction of blood flow towards the liver. IVC: Not well visualized due to bowel gas. Pancreas: Tail is obscured by bowel gas. Remainder of the pancreas is  unremarkable. Spleen: Size and appearance within normal limits. Right Kidney: Length: 10.4 cm. Echogenicity within normal limits. No mass or hydronephrosis visualized. Left Kidney: Length: 11.1 cm. Echogenicity within normal limits. No mass or hydronephrosis visualized. Abdominal aorta: No aneurysm visualized. Other findings: None. IMPRESSION: 1. Hepatic steatosis. 2. Suboptimal evaluation of the IVC and pancreas due to bowel gas. 3. Otherwise unremarkable exam. Electronically Signed   By: Randa Ngo M.D.   On: 09/09/2020 01:00   Scheduled Meds: . amLODipine  5 mg Oral Daily  . doxepin  30 mg Oral QHS  . folic acid  1 mg Oral Daily  . gabapentin  200 mg Oral TID  . LORazepam  0-4 mg Oral Q6H   Followed by  . [START ON 09/10/2020] LORazepam  0-4 mg Oral Q12H  . metoprolol tartrate  25 mg Oral BID  . multivitamin with minerals  1 tablet Oral Daily  . potassium chloride  40 mEq Oral BID  . thiamine  100 mg Oral Daily   Or  . thiamine  100 mg Intravenous Daily  . traZODone  150 mg Oral QHS   Continuous Infusions: . sodium chloride 125 mL/hr at 09/09/20 0510    LOS: 1 day   Kerney Elbe, DO [facility] PAGER is on [place]  If 7PM-7AM, please contact night-coverage [email or web address]

## 2020-09-09 NOTE — ED Notes (Signed)
Pt resting with eyes closed. Respirations even and unlabored with equal chest rise. Attached to cardiac monitor x3. VSS.

## 2020-09-09 NOTE — ED Notes (Signed)
Pt A&O x4. Readjusted in ED stretcher. Pt sitting upright and eating breakfast at this time.

## 2020-09-09 NOTE — Progress Notes (Addendum)
Delay in accepting due to conversation w/ Dr. Alfredia Ferguson and Sharrell Ku, RN (ED Nurse) about patient change in LOC from earlier in the shift.   Spoke w/ Harvest Dark, RN and charge nurse about this patient. She spoke w/ [facility] Placido Sou, RN. Placido Sou, RN was added to secure chat w/ Dr. Alfredia Ferguson and Sharrell Ku, RN ED Nurse  And she is going to assess patient in the ED. Will continue to delay acceptance of patient until further recommendations from Placido Sou, RN.

## 2020-09-09 NOTE — ED Notes (Signed)
Ultrasound at bedside

## 2020-09-10 DIAGNOSIS — E876 Hypokalemia: Secondary | ICD-10-CM

## 2020-09-10 LAB — CBC WITH DIFFERENTIAL/PLATELET
Abs Immature Granulocytes: 0.01 10*3/uL (ref 0.00–0.07)
Basophils Absolute: 0 10*3/uL (ref 0.0–0.1)
Basophils Relative: 1 %
Eosinophils Absolute: 0.3 10*3/uL (ref 0.0–0.5)
Eosinophils Relative: 7 %
HCT: 45.2 % (ref 39.0–52.0)
Hemoglobin: 15 g/dL (ref 13.0–17.0)
Immature Granulocytes: 0 %
Lymphocytes Relative: 22 %
Lymphs Abs: 0.9 10*3/uL (ref 0.7–4.0)
MCH: 30.5 pg (ref 26.0–34.0)
MCHC: 33.2 g/dL (ref 30.0–36.0)
MCV: 91.9 fL (ref 80.0–100.0)
Monocytes Absolute: 0.5 10*3/uL (ref 0.1–1.0)
Monocytes Relative: 13 %
Neutro Abs: 2.3 10*3/uL (ref 1.7–7.7)
Neutrophils Relative %: 57 %
Platelets: 84 10*3/uL — ABNORMAL LOW (ref 150–400)
RBC: 4.92 MIL/uL (ref 4.22–5.81)
RDW: 13.9 % (ref 11.5–15.5)
WBC: 4 10*3/uL (ref 4.0–10.5)
nRBC: 0 % (ref 0.0–0.2)

## 2020-09-10 LAB — COMPREHENSIVE METABOLIC PANEL
ALT: 55 U/L — ABNORMAL HIGH (ref 0–44)
AST: 51 U/L — ABNORMAL HIGH (ref 15–41)
Albumin: 3.8 g/dL (ref 3.5–5.0)
Alkaline Phosphatase: 59 U/L (ref 38–126)
Anion gap: 10 (ref 5–15)
BUN: 22 mg/dL (ref 8–23)
CO2: 24 mmol/L (ref 22–32)
Calcium: 9 mg/dL (ref 8.9–10.3)
Chloride: 102 mmol/L (ref 98–111)
Creatinine, Ser: 1.17 mg/dL (ref 0.61–1.24)
GFR, Estimated: >60 mL/min (ref >60)
Glucose, Bld: 92 mg/dL (ref 70–99)
Potassium: 3.6 mmol/L (ref 3.5–5.1)
Sodium: 136 mmol/L (ref 135–145)
Total Bilirubin: 0.9 mg/dL (ref 0.3–1.2)
Total Protein: 6.9 g/dL (ref 6.5–8.1)

## 2020-09-10 LAB — MICROALBUMIN / CREATININE URINE RATIO
Creatinine, Urine: 75 mg/dL
Microalb Creat Ratio: 23 mg/g creat (ref 0–29)
Microalb, Ur: 17.3 ug/mL — ABNORMAL HIGH

## 2020-09-10 LAB — MAGNESIUM: Magnesium: 1.8 mg/dL (ref 1.7–2.4)

## 2020-09-10 LAB — PHOSPHORUS: Phosphorus: 2.9 mg/dL (ref 2.5–4.6)

## 2020-09-10 MED ORDER — SODIUM CHLORIDE 0.9 % IV SOLN: INTRAVENOUS | Status: DC

## 2020-09-10 MED ORDER — MAGNESIUM SULFATE 2 GM/50ML IV SOLN
2.0000 g | Freq: Once | INTRAVENOUS | Status: AC
  Administered 2020-09-10: 2 g via INTRAVENOUS
  Filled 2020-09-10: qty 50

## 2020-09-10 MED ORDER — POTASSIUM CHLORIDE CRYS ER 10 MEQ PO TBCR
40.0000 meq | EXTENDED_RELEASE_TABLET | Freq: Once | ORAL | Status: AC
  Administered 2020-09-10: 40 meq via ORAL
  Filled 2020-09-10: qty 4

## 2020-09-10 NOTE — Care Management Important Message (Signed)
Important Message  Patient Details IM Letter given to the Patient. Name: Ruben [name] MRN: [id number] Date of Birth: 1950/08/17   Medicare Important Message Given:  Yes     Kerin Salen 09/10/2020, 9:52 AM

## 2020-09-10 NOTE — Evaluation (Signed)
Physical Therapy Evaluation Patient Details Name: Ruben [name] MRN: [id number] DOB: 02/04/1951 Today's Date: 09/10/2020   History of Present Illness  Patient is a 70 year old male presenting to the emergency department requesting help with alcohol cessation, also reports abdominal pain and distention. PMH includes alcohol dependence, hypertension, insomnia, and pulmonary nodule  Clinical Impression  Pt admitted with above diagnosis.  Pt currently with functional limitations due to the deficits listed below (see PT Problem List). Pt will benefit from skilled PT to increase their independence and safety with mobility to allow discharge to the venue listed below.  Pt familiar to this PT from previous admissions.  Pt typically requires some assist initially and then progresses to ambulating with RW and discharges home.  Pt currently requiring min assist for stability with mobility and requiring cues for safety however anticipate d/c home once withdrawal symptoms improve.     Follow Up Recommendations Supervision for mobility/OOB;Home health PT    Equipment Recommendations  None recommended by PT    Recommendations for Other Services       Precautions / Restrictions Precautions Precautions: Fall Restrictions Weight Bearing Restrictions: No      Mobility  Bed Mobility               General bed mobility comments: pt in recliner    Transfers Overall transfer level: Needs assistance Equipment used: Rolling walker (2 wheeled) Transfers: Sit to/from Stand Sit to Stand: Min assist         General transfer comment: cues for safe hand placement,  assist for controlling descent as pt attempting to sit down prior to being completely near chair  Ambulation/Gait Ambulation/Gait assistance: Min assist;Min guard Gait Distance (Feet): 200 Feet Assistive device: Rolling walker (2 wheeled) Gait Pattern/deviations: Step-through pattern;Decreased stride length     General Gait  Details: assist for negotiating RW, pt drifting to the left, more steady with UE support  Stairs            Wheelchair Mobility    Modified Rankin (Stroke Patients Only)       Balance Overall balance assessment: Needs assistance;History of Falls         Standing balance support: Bilateral upper extremity supported Standing balance-Leahy Scale: Poor                               Pertinent Vitals/Pain Pain Assessment: No/denies pain    Home Living Family/patient expects to be discharged to:: Private residence Living Arrangements: Alone Available Help at Discharge: Family;Neighbor;Available PRN/intermittently Type of Home: House Home Access: Stairs to enter   Entrance Stairs-Number of Steps: 1 Home Layout: One level Home Equipment: Walker - 2 wheels;Shower seat      Prior Function Level of Independence: Independent         Comments: patient states feeling low energy since Tuesday     Hand Dominance   Dominant Hand: Right    Extremity/Trunk Assessment        Lower Extremity Assessment Lower Extremity Assessment: Generalized weakness    Cervical / Trunk Assessment Cervical / Trunk Assessment: Normal  Communication   Communication: No difficulties  Cognition Arousal/Alertness: Awake/alert Behavior During Therapy: WFL for tasks assessed/performed Overall Cognitive Status: No family/caregiver present to determine baseline cognitive functioning  General Comments: min cues for direction following, pleasantly confused      General Comments      Exercises     Assessment/Plan    PT Assessment Patient needs continued PT services  PT Problem List Decreased strength;Decreased mobility;Decreased activity tolerance;Decreased balance;Decreased knowledge of use of DME;Decreased safety awareness       PT Treatment Interventions Gait training;DME instruction;Therapeutic exercise;Balance  training;Functional mobility training;Therapeutic activities;Patient/family education    PT Goals (Current goals can be found in the Care Plan section)  Acute Rehab PT Goals PT Goal Formulation: With patient Time For Goal Achievement: 09/24/20 Potential to Achieve Goals: Good    Frequency Min 3X/week   Barriers to discharge        Co-evaluation               AM-PAC PT "6 Clicks" Mobility  Outcome Measure Help needed turning from your back to your side while in a flat bed without using bedrails?: A Little Help needed moving from lying on your back to sitting on the side of a flat bed without using bedrails?: A Little Help needed moving to and from a bed to a chair (including a wheelchair)?: A Little Help needed standing up from a chair using your arms (e.g., wheelchair or bedside chair)?: A Little Help needed to walk in hospital room?: A Little Help needed climbing 3-5 steps with a railing? : A Lot 6 Click Score: 17    End of Session Equipment Utilized During Treatment: Gait belt Activity Tolerance: Patient tolerated treatment well Patient left: in chair;with call bell/phone within reach;with chair alarm set Nurse Communication: Mobility status PT Visit Diagnosis: Difficulty in walking, not elsewhere classified (R26.2)    Time: 1351-1410 PT Time Calculation (min) (ACUTE ONLY): 19 min   Charges:   PT Evaluation $PT Eval Low Complexity: 1 Low     Kati PT, DPT Acute Rehabilitation Services Pager: [phone] Office: [phone]   York Ram E 09/10/2020, 3:36 PM

## 2020-09-10 NOTE — Progress Notes (Signed)
PROGRESS NOTE    Ruben [name]  JQB:[id number] DOB: [birth year] DOA: 09/08/2020 PCP: [name], MD   Brief Narrative:  The patient is a 70 year old male with a past medical history significant for but not limited to alcohol dependence, hypertension, insomnia, history of pulmonary nodule as well as other comorbidities who presented to the emergency department requesting help with alcohol cessation as well as reporting abdominal pain and distention.  EMS notes that family risk and return for increased confusion as well and patient notes that he has issues with abdominal pain and distention for roughly 2 years that is waxing and waning but has not been bothering him for last 2 days.  He had reported that he attempted avoid alcohol after his hospitalized in March before she began drinking again and reports that he could been consuming 12-25 drinks daily mainly vodka each day.  He wants to achieve abstinence and be seen but worries about withdrawal symptoms and his last drink was 09/07/2020.  Family member reported to EMS that he seemed more confused and patient reports occasional confusion but denies any headaches changes in vision or hearing.  Upon arrival to the ED he was found to be afebrile saturating well on room air he is mildly tachycardic with a stable blood pressure.  Head CT was negative for any acute intracranial abnormality CMP was done which showed an elevated creatinine and low sodium.  Patient's ammonia level is also 47.  Platelet count was on the lower side.  He was admitted for acute kidney injury as well as alcohol dependence with concern for withdrawal and currently getting IV fluid hydration and being placed on a CIWA protocol.  Patient's renal function is improving however he is still in alcohol withdrawal and now starting to hallucinate now.  Assessment & Plan:   Principal Problem:   Acute kidney injury [facility]) Active Problems:   Thrombocytopenia (HCC)   Alcohol use disorder,  severe, dependence (HCC)   Hyponatremia   Hyperbilirubinemia  AKI -Improving. Patient's BUN/Cr went from 43/2.02 -> 35/1.29 and today it is 22/1.17 -He was given IVF with NS at 125 mL/hr but this is expired so we will resume IV fluid hydration with normal saline at 75 MLS per hour -Avoid Nephrotoxic Medications, Contrast Dyes, Hypotension, and Renally Adjust Medications -Continue to Monitor and Trend Renal Fxn Carefully -Repeat CMP in the AM   Abnormal LFT's/Elevated LFTs -In the setting of Alcohol Abuse -Improving. Patient's AST went from 67 -> 42 -> 51 and ALT went from 51 -> 46 -> 55 -Continue to Monitor and Trend Hepatic Fxn Panel and if Necessary will obtain a RUQ U/S and an Acute Hepatitis Panel -Repeat CMP in the AM  Normocytic Anemia -Patient's Hgb/Hct went from 13.5/40.7 -> 12.8/37.5 -> 15.0/45.2 -Check Anemia Panel in the AM -Continue to Monitor for S/Sx of Bleeding; Currently no overt bleeding noted -Repeat CBC in the AM   Thrombocytopenia -Patient's Platelet Count has gone from 182 on 08/25/20 to 63 on admission and is now slowly improving to 84 -Continue to Monitor for S/Sx of Bleeding; No overt bleeding noted -Repeat CBC in the AM   Hyperbilirubinemia -Improved. T Bili went from 2.1 -> 1.0 -> 0.9 -Continue to Monitor and Trend -Repeat CMP in the AM   Hyponatremia -Mild and Improving now -Na+ went from 130 -> 134 -> 136 -C/w IVF Hydration with NS but resume at 75 mL/hr -Repeat CMP in the AM   Hypokalemia -Mild. K+ went from 4.6 -> 3.2 ->  3.6 -Replete with po KCl 40 mEQ once -Mag Level was 1.8; Will replete with IV Mag Sulfate 2 grams -Continue to Monitor and Replete as Necessary -Repeat CMP in the AM   Alcohol Abuse and Dependence with Concern for Withdrawal -Last time he was in the ED EtOH level was 303; This visit was <10 -Reportedly drinks 12-25 drinks daily and wants to quit; Last Drink was 5/31 -C/w IVF Hydration with NS at 125 mL/hr x 18  hours -Placed on CIWA Protocol with po/IV Lorazepam 0-4 mg  q6h followed by q12h and 1-4 mg po/IV [id number]  -C/w Folic Acid 1 mg po Daily, MVI+Minerals 1 tab po Daily and Thiamine 100 m po/IV Daily  HTN -C/w Amlodipine 5 mg po daily and with Metoprolol Tartrate 25 mg po BID -Continue to Monitor BP per Protocol -Last BP was 137/82  Depression/Anxiety and Insomnia -C/w Doxepin 30 mg po qHS and Trazodone 150 mg po qHS  DVT prophylaxis: SCDs given Thrombocytopenia  Code Status: FULL CODE  Family Communication: No family present at bedside  Disposition Plan: Pending further clinical Improvement  Status is: Inpatient  Remains inpatient appropriate because:Unsafe d/c plan, IV treatments appropriate due to intensity of illness or inability to take PO and Inpatient level of care appropriate due to severity of illness   Dispo: The patient is from: Home              Anticipated d/c is to: TBD              Patient currently is not medically stable to d/c.   Difficult to place patient No  Consultants:   None   Procedures: None  Antimicrobials:  Anti-infectives (From admission, onward)   None        Subjective: Seen and examined at bedside and he was awake this morning but nursing states that he was confused and searching for his cat in the room.  This morning when I saw him his mental status was appropriate but has been waxing and waning.  He denied any pain.  No nausea or vomiting.  No other concerns or complaints at this time.  Objective: Vitals:   09/09/20 1812 09/09/20 1958 09/09/20 2336 09/10/20 0343  BP: (!) 143/77 (!) 129/95 131/75 137/82  Pulse: (!) 59 69 75 71  Resp:  15 18 18   Temp:  97.8 F (36.6 C) 98 F (36.7 C) 98.2 F (36.8 C)  TempSrc:  Oral    SpO2:  98% 99% 99%    Intake/Output Summary (Last 24 hours) at 09/10/2020 1141 Last data filed at 09/10/2020 0900 Gross per 24 hour  Intake 2121.25 ml  Output 2700 ml  Net -578.75 ml   There were no vitals filed  for this visit.  Examination: Physical Exam:  Constitutional: WN/WD Caucasian male in NAD and appears calm and comfortable Eyes: Lids and conjunctivae normal, sclerae anicteric  ENMT: External Ears, Nose appear normal. Grossly normal hearing. Neck: Appears normal, supple, no cervical masses, normal ROM, no appreciable thyromegaly; no JVD Respiratory: Diminished to auscultation bilaterally, no wheezing, rales, rhonchi or crackles. Normal respiratory effort and patient is not tachypenic. No accessory muscle use. Unlabored breathing  Cardiovascular: RRR, no murmurs / rubs / gallops. S1 and S2 auscultated. Trace edema Abdomen: Soft, non-tender, Distended 2/2 body habitus. Bowel sounds positive.  GU: Deferred. Musculoskeletal: No clubbing / cyanosis of digits/nails. No joint deformity upper and lower extremities.  Skin: No rashes, lesions, ulcers on a limited skin evaluation. No induration; Warm  and dry.  Neurologic: CN 2-12 grossly intact with no focal deficits. Romberg sign and cerebellar reflexes not assessed.  Psychiatric: Normal judgment and insight. Alert and oriented x 2. Normal mood and appropriate affect.   Data Reviewed: I have personally reviewed following labs and imaging studies  CBC: Recent Labs  Lab 09/08/20 1729 09/09/20 0415 09/10/20 0512  WBC 6.1 4.3 4.0  NEUTROABS 4.5  --  2.3  HGB 13.5 12.8* 15.0  HCT 40.7 37.5* 45.2  MCV 93.6 90.1 91.9  PLT 63* 61* 84*   Basic Metabolic Panel: Recent Labs  Lab 09/08/20 1729 09/09/20 0415 09/10/20 0512  NA 130* 134* 136  K 4.6 3.2* 3.6  CL 95* 99 102  CO2 18* 24 24  GLUCOSE 104* 99 92  BUN 43* 35* 22  CREATININE 2.02* 1.29* 1.17  CALCIUM 8.9 8.6* 9.0  MG  --  2.1 1.8  PHOS  --  2.8 2.9   GFR: CrCl cannot be calculated (Unknown ideal weight.). Liver Function Tests: Recent Labs  Lab 09/08/20 1729 09/09/20 0415 09/10/20 0512  AST 67* 42* 51*  ALT 51* 46* 55*  ALKPHOS 66 57 59  BILITOT 2.1* 1.0 0.9  PROT 7.5  6.4* 6.9  ALBUMIN 4.2 3.6 3.8   No results for input(s): LIPASE, AMYLASE in the last 168 hours. Recent Labs  Lab 09/08/20 1729 09/09/20 1533  AMMONIA 47* 17   Coagulation Profile: No results for input(s): INR, PROTIME in the last 168 hours. Cardiac Enzymes: No results for input(s): CKTOTAL, CKMB, CKMBINDEX, TROPONINI in the last 168 hours. BNP (last 3 results) No results for input(s): PROBNP in the last 8760 hours. HbA1C: No results for input(s): HGBA1C in the last 72 hours. CBG: No results for input(s): GLUCAP in the last 168 hours. Lipid Profile: No results for input(s): CHOL, HDL, LDLCALC, TRIG, CHOLHDL, LDLDIRECT in the last 72 hours. Thyroid Function Tests: No results for input(s): TSH, T4TOTAL, FREET4, T3FREE, THYROIDAB in the last 72 hours. Anemia Panel: No results for input(s): VITAMINB12, FOLATE, FERRITIN, TIBC, IRON, RETICCTPCT in the last 72 hours. Sepsis Labs: No results for input(s): PROCALCITON, LATICACIDVEN in the last 168 hours.  Recent Results (from the past 240 hour(s))  SARS CORONAVIRUS 2 (TAT 6-24 HRS) Nasopharyngeal Nasopharyngeal Swab     Status: None   Collection Time: 09/08/20 10:18 PM   Specimen: Nasopharyngeal Swab  Result Value Ref Range Status   SARS Coronavirus 2 NEGATIVE NEGATIVE Final    Comment: (NOTE) SARS-CoV-2 target nucleic acids are NOT DETECTED.  The SARS-CoV-2 RNA is generally detectable in upper and lower respiratory specimens during the acute phase of infection. Negative results do not preclude SARS-CoV-2 infection, do not rule out co-infections with other pathogens, and should not be used as the sole basis for treatment or other patient management decisions. Negative results must be combined with clinical observations, patient history, and epidemiological information. The expected result is Negative.  Fact Sheet for Patients: SugarRoll.be  Fact Sheet for Healthcare  Providers: https://www.woods-mathews.com/  This test is not yet approved or cleared by the [place] FDA and  has been authorized for detection and/or diagnosis of SARS-CoV-2 by FDA under an Emergency Use Authorization (EUA). This EUA will remain  in effect (meaning this test can be used) for the duration of the COVID-19 declaration under Se ction 564(b)(1) of the Act, 21 U.S.C. section 360bbb-3(b)(1), unless the authorization is terminated or revoked sooner.  Performed at [facility], [place] [address] [place], [place] [address]  RN Pressure Injury Documentation:     Estimated body mass index is 29.46 kg/m as calculated from the following:   Height as of 08/25/20: 5\' 11"  (1.803 m).   Weight as of 08/25/20: 95.8 kg.  Malnutrition Type:   Malnutrition Characteristics:   Nutrition Interventions:    Radiology Studies: CT Head Wo Contrast  Result Date: 09/08/2020 CLINICAL DATA:  Delirium, weakness, confusion, worsened symptoms, history of hypertension EXAM: CT HEAD WITHOUT CONTRAST TECHNIQUE: Contiguous axial images were obtained from the base of the skull through the vertex without intravenous contrast. Sagittal and coronal MPR images reconstructed from axial data set. COMPARISON:  12/23/2019 FINDINGS: Brain: Minimal atrophy. Normal ventricular morphology. No midline shift or mass effect. Minimal small vessel chronic ischemic changes of deep cerebral white matter. No intracranial hemorrhage, mass lesion, or evidence of acute infarction. No extra-axial fluid collections. Vascular: No hyperdense vessels. Minimal atherosclerotic calcification of internal carotid arteries at skull base. Skull: Intact Sinuses/Orbits: Chronic opacity in RIGHT frontal sinus. Remaining paranasal sinuses and mastoid air cells clear. Other: N/A IMPRESSION: Minimal atrophy and small vessel chronic ischemic changes of deep cerebral white matter. No acute intracranial abnormalities.  Electronically Signed   By: Lavonia Dana M.D.   On: 09/08/2020 17:27   US Abdomen Complete  Result Date: 09/09/2020 CLINICAL DATA:  Abdominal distension, elevated liver function tests, acute renal insufficiency EXAM: ABDOMEN ULTRASOUND COMPLETE COMPARISON:  10/01/2019 FINDINGS: Gallbladder: No gallstones or wall thickening visualized. No sonographic Murphy sign noted by sonographer. Common bile duct: Diameter: 3 mm Liver: Diffuse increased liver echotexture consistent with hepatic steatosis. No focal liver abnormality. No biliary duct dilation. Portal vein is patent on color Doppler imaging with normal direction of blood flow towards the liver. IVC: Not well visualized due to bowel gas. Pancreas: Tail is obscured by bowel gas. Remainder of the pancreas is unremarkable. Spleen: Size and appearance within normal limits. Right Kidney: Length: 10.4 cm. Echogenicity within normal limits. No mass or hydronephrosis visualized. Left Kidney: Length: 11.1 cm. Echogenicity within normal limits. No mass or hydronephrosis visualized. Abdominal aorta: No aneurysm visualized. Other findings: None. IMPRESSION: 1. Hepatic steatosis. 2. Suboptimal evaluation of the IVC and pancreas due to bowel gas. 3. Otherwise unremarkable exam. Electronically Signed   By: Randa Ngo M.D.   On: 09/09/2020 01:00   Scheduled Meds: . amLODipine  5 mg Oral Daily  . ammonia  1 each Inhalation Once  . doxepin  30 mg Oral QHS  . folic acid  1 mg Oral Daily  . LORazepam  0-4 mg Oral Q6H   Followed by  . LORazepam  0-4 mg Oral Q12H  . metoprolol tartrate  25 mg Oral BID  . multivitamin with minerals  1 tablet Oral Daily  . thiamine  100 mg Oral Daily   Or  . thiamine  100 mg Intravenous Daily  . traZODone  150 mg Oral QHS   Continuous Infusions:   LOS: 2 days   Kerney Elbe, DO [facility] PAGER is on AMION  If 7PM-7AM, please contact night-coverage [email or web address]

## 2020-09-10 NOTE — Evaluation (Signed)
Occupational Therapy Evaluation Patient Details Name: Ruben [name] MRN: [id number] DOB: [birth year] Today's Date: 09/10/2020    History of Present Illness Patient is a 70 year old male presenting to the emergency department requesting help with alcohol cessation, also reports abdominal pain and distention. PMH includes alcohol dependence, hypertension, insomnia, and pulmonary nodule   Clinical Impression   Patient lives alone in single level house with 1 step to enter. Reports not using adaptive device and independent with self care however "has been hard." Reports less energy, had trouble keeping up walking with a friend when they went out to eat on Tuesday per patient. Currently patient is a fall risk with poor safety awareness needing cues for hand placement during sit <> stand. Min A standing edge of bed due to posterior bias, also needing cues for sequencing with walker to stand in front of sink for ADL task. Patient with posterior loss of balance seated edge of bed during LB dressing needing mod A to complete, may benefit from education on adaptive equipment. As patient is currently a fall risk would recommend 24/7 support at home at D/C, if family/friends unable then recommend rehab. Acute OT to follow for patient progress.     Follow Up Recommendations  Home health OT;Supervision/Assistance - 24 hour;Other (comment) (currently fall risk, if unable to have 24/7 support then SNF)    Equipment Recommendations  Other (comment) (long handle adaptive equipment)       Precautions / Restrictions Precautions Precautions: Fall Restrictions Weight Bearing Restrictions: No      Mobility Bed Mobility Overal bed mobility: Needs Assistance Bed Mobility: Supine to Sit     Supine to sit: Min guard;HOB elevated     General bed mobility comments: d/t balance    Transfers Overall transfer level: Needs assistance Equipment used: Rolling walker (2 wheeled) Transfers: Sit to/from Stand Sit  to Stand: Min assist         General transfer comment: cues for safe hand placement, posterior bias with initial stand from edge of bed. also sits quickly into chair without reaching back when cued    Balance Overall balance assessment: Needs assistance;History of Falls Sitting-balance support: Feet supported Sitting balance-Leahy Scale: Fair     Standing balance support: During functional activity;Single extremity supported Standing balance-Leahy Scale: Poor Standing balance comment: leans onto sink for support during ADL task                           ADL either performed or assessed with clinical judgement   ADL Overall ADL's : Needs assistance/impaired     Grooming: Wash/dry face;Wash/dry hands;Min guard;Standing Grooming Details (indicate cue type and reason): mildly unsteady in standing at sink Upper Body Bathing: Set up;Sitting   Lower Body Bathing: Moderate assistance;Sitting/lateral leans;Sit to/from stand   Upper Body Dressing : Set up;Sitting   Lower Body Dressing: Moderate assistance;Sitting/lateral leans;Sit to/from stand Lower Body Dressing Details (indicate cue type and reason): patient with posterior loss of balance trying to doff sock and having difficulty, attempted crossing leg to don sock however unable to reach and leaning back. reporting difficulty with this at home, may benefit from A/E Toilet Transfer: Minimal assistance;Ambulation;RW;Cueing for safety Toilet Transfer Details (indicate cue type and reason): first to sink then to recliner, min A for steadying as patient with posterior bias standing from edge of bed. needs cues for safe hand placement Toileting- Clothing Manipulation and Hygiene: Minimal assistance;Sitting/lateral lean;Sit to/from stand  Functional mobility during ADLs: Minimal assistance;Rolling walker;Cueing for safety                    Pertinent Vitals/Pain Pain Assessment: No/denies pain     Hand  Dominance Right   Extremity/Trunk Assessment Upper Extremity Assessment Upper Extremity Assessment: Overall WFL for tasks assessed   Lower Extremity Assessment Lower Extremity Assessment: Defer to PT evaluation   Cervical / Trunk Assessment Cervical / Trunk Assessment: Normal   Communication Communication Communication: No difficulties   Cognition Arousal/Alertness: Awake/alert Behavior During Therapy: WFL for tasks assessed/performed Overall Cognitive Status: No family/caregiver present to determine baseline cognitive functioning                                 General Comments: min cues for direction following, did not ask orientation questions              Home Living Family/patient expects to be discharged to:: Private residence Living Arrangements: Alone Available Help at Discharge: Family;Neighbor;Available PRN/intermittently Type of Home: House Home Access: Stairs to enter Entrance Stairs-Number of Steps: 1   Home Layout: One level     Bathroom Shower/Tub: Tub/shower unit;Walk-in shower   Bathroom Toilet: Handicapped height     Home Equipment: Environmental consultant - 2 wheels;Shower seat          Prior Functioning/Environment Level of Independence: Independent        Comments: patient states feeling low energy since Tuesday        OT Problem List: Decreased activity tolerance;Impaired balance (sitting and/or standing);Decreased safety awareness;Decreased knowledge of use of DME or AE      OT Treatment/Interventions: Self-care/ADL training;Therapeutic exercise;DME and/or AE instruction;Therapeutic activities;Patient/family education;Balance training    OT Goals(Current goals can be found in the care plan section) Acute Rehab OT Goals Patient Stated Goal: get energy back OT Goal Formulation: With patient Time For Goal Achievement: 09/24/20 Potential to Achieve Goals: Good  OT Frequency: Min 2X/week    AM-PAC OT "6 Clicks" Daily Activity      Outcome Measure Help from another person eating meals?: None Help from another person taking care of personal grooming?: A Little Help from another person toileting, which includes using toliet, bedpan, or urinal?: A Little Help from another person bathing (including washing, rinsing, drying)?: A Lot Help from another person to put on and taking off regular upper body clothing?: A Little Help from another person to put on and taking off regular lower body clothing?: A Lot 6 Click Score: 17   End of Session Equipment Utilized During Treatment: Rolling walker Nurse Communication: Mobility status  Activity Tolerance: Patient tolerated treatment well Patient left: in chair;with call bell/phone within reach;with chair alarm set  OT Visit Diagnosis: Unsteadiness on feet (R26.81);Other abnormalities of gait and mobility (R26.89);History of falling (Z91.81)                Time: 8502-7741 OT Time Calculation (min): 18 min Charges:  OT General Charges $OT Visit: 1 Visit OT Evaluation $OT Eval Low Complexity: [place] OT OT pager: [phone]  Rosemary Holms 09/10/2020, 8:51 AM

## 2020-09-10 NOTE — Progress Notes (Signed)
Patient is experiencing hallucinations with impulsivity; getting up without assistance. Bed/chair alarms in place.

## 2020-09-11 LAB — RETICULOCYTES
Immature Retic Fract: 8.9 % (ref 2.3–15.9)
RBC.: 4.71 MIL/uL (ref 4.22–5.81)
Retic Count, Absolute: 65.9 10*3/uL (ref 19.0–186.0)
Retic Ct Pct: 1.4 % (ref 0.4–3.1)

## 2020-09-11 LAB — FERRITIN: Ferritin: 230 ng/mL (ref 24–336)

## 2020-09-11 LAB — CBC WITH DIFFERENTIAL/PLATELET
Abs Immature Granulocytes: 0.01 10*3/uL (ref 0.00–0.07)
Basophils Absolute: 0 10*3/uL (ref 0.0–0.1)
Basophils Relative: 1 %
Eosinophils Absolute: 0.2 10*3/uL (ref 0.0–0.5)
Eosinophils Relative: 6 %
HCT: 43.5 % (ref 39.0–52.0)
Hemoglobin: 14.6 g/dL (ref 13.0–17.0)
Immature Granulocytes: 0 %
Lymphocytes Relative: 17 %
Lymphs Abs: 0.7 10*3/uL (ref 0.7–4.0)
MCH: 30.5 pg (ref 26.0–34.0)
MCHC: 33.6 g/dL (ref 30.0–36.0)
MCV: 90.8 fL (ref 80.0–100.0)
Monocytes Absolute: 0.7 10*3/uL (ref 0.1–1.0)
Monocytes Relative: 17 %
Neutro Abs: 2.3 10*3/uL (ref 1.7–7.7)
Neutrophils Relative %: 59 %
Platelets: 117 10*3/uL — ABNORMAL LOW (ref 150–400)
RBC: 4.79 MIL/uL (ref 4.22–5.81)
RDW: 13.7 % (ref 11.5–15.5)
WBC: 3.9 10*3/uL — ABNORMAL LOW (ref 4.0–10.5)
nRBC: 0 % (ref 0.0–0.2)

## 2020-09-11 LAB — COMPREHENSIVE METABOLIC PANEL
ALT: 50 U/L — ABNORMAL HIGH (ref 0–44)
AST: 43 U/L — ABNORMAL HIGH (ref 15–41)
Albumin: 3.7 g/dL (ref 3.5–5.0)
Alkaline Phosphatase: 57 U/L (ref 38–126)
Anion gap: 9 (ref 5–15)
BUN: 16 mg/dL (ref 8–23)
CO2: 26 mmol/L (ref 22–32)
Calcium: 9 mg/dL (ref 8.9–10.3)
Chloride: 100 mmol/L (ref 98–111)
Creatinine, Ser: 0.99 mg/dL (ref 0.61–1.24)
GFR, Estimated: >60 mL/min (ref >60)
Glucose, Bld: 95 mg/dL (ref 70–99)
Potassium: 3.7 mmol/L (ref 3.5–5.1)
Sodium: 135 mmol/L (ref 135–145)
Total Bilirubin: 0.9 mg/dL (ref 0.3–1.2)
Total Protein: 6.6 g/dL (ref 6.5–8.1)

## 2020-09-11 LAB — IRON AND TIBC
Iron: 30 ug/dL — ABNORMAL LOW (ref 45–182)
Saturation Ratios: 12 % — ABNORMAL LOW (ref 17.9–39.5)
TIBC: 245 ug/dL — ABNORMAL LOW (ref 250–450)
UIBC: 215 ug/dL

## 2020-09-11 LAB — PHOSPHORUS: Phosphorus: 3 mg/dL (ref 2.5–4.6)

## 2020-09-11 LAB — VITAMIN B12: Vitamin B-12: 906 pg/mL (ref 180–914)

## 2020-09-11 LAB — MAGNESIUM: Magnesium: 1.8 mg/dL (ref 1.7–2.4)

## 2020-09-11 LAB — FOLATE: Folate: 32.3 ng/mL (ref >5.9)

## 2020-09-11 NOTE — Progress Notes (Signed)
PROGRESS NOTE    Ruben [name]  ZJQ:[id number] DOB: 23-Dec-1950 DOA: 09/08/2020 PCP: [name], MD   Brief Narrative:  The patient is a 70 year old male with a past medical history significant for but not limited to alcohol dependence, hypertension, insomnia, history of pulmonary nodule as well as other comorbidities who presented to the emergency department requesting help with alcohol cessation as well as reporting abdominal pain and distention.  EMS notes that family risk and return for increased confusion as well and patient notes that he has issues with abdominal pain and distention for roughly 2 years that is waxing and waning but has not been bothering him for last 2 days.  He had reported that he attempted avoid alcohol after his hospitalized in March before she began drinking again and reports that he could been consuming 12-25 drinks daily mainly vodka each day.  He wants to achieve abstinence and be seen but worries about withdrawal symptoms and his last drink was 09/07/2020.  Family member reported to EMS that he seemed more confused and patient reports occasional confusion but denies any headaches changes in vision or hearing.  Upon arrival to the ED he was found to be afebrile saturating well on room air he is mildly tachycardic with a stable blood pressure.  Head CT was negative for any acute intracranial abnormality CMP was done which showed an elevated creatinine and low sodium.  Patient's ammonia level is also 47.  Platelet count was on the lower side.  He was admitted for acute kidney injury as well as alcohol dependence with concern for withdrawal and currently getting IV fluid hydration and being placed on a CIWA protocol.  Patient's renal function is improving however he is still in alcohol withdrawal and now starting to hallucinate now.  Patient continues to hallucinate and CIWA scores continue to be elevated ranging from 4- 15.  Patient today thought he was in [facility].    Assessment & Plan:   Principal Problem:   Acute kidney injury [facility]) Active Problems:   Thrombocytopenia (HCC)   Alcohol use disorder, severe, dependence (HCC)   Hyponatremia   Hyperbilirubinemia  AKI, improving  -Improving. Patient's BUN/Cr went from 43/2.02 -> 35/1.29 -> 22/1.17 -> 16/0.99 -He was given IVF with NS at 125 mL/hr but this is expired so we will resume IV fluid hydration with normal saline at 75 MLS per hour -Avoid Nephrotoxic Medications, Contrast Dyes, Hypotension, and Renally Adjust Medications -Continue to Monitor and Trend Renal Fxn Carefully -Repeat CMP in the AM   Abnormal LFT's/Elevated LFTs -In the setting of Alcohol Abuse -Improving. Patient's AST went from 67 -> 42 -> 51 -> 43 and ALT went from 51 -> 46 -> 55 -> 50 -Continue to Monitor and Trend Hepatic Fxn Panel  -Will check a RUQ U/S and Acute Hepatitis Panel in the AM -Repeat CMP in the AM  Normocytic Anemia -Patient's Hgb/Hct went from 13.5/40.7 -> 12.8/37.5 -> 15.0/45.2 -> 14.6/43.5 -Check Anemia Panel and showed Iron Level of 30, UBIC 215, TIBC of 245, Saturation Ratios was 12, Ferritin Level of 230, Folate 32.3, Vitamin B12 of 906 -Continue to Monitor for S/Sx of Bleeding; Currently no overt bleeding noted -Repeat CBC in the AM   Thrombocytopenia -Patient's Platelet Count has gone from 182 on 08/25/20 to 63 on admission and is now slowly improving to 84 -> 117 -Continue to Monitor for S/Sx of Bleeding; No overt bleeding noted -Repeat CBC in the AM   Hyperbilirubinemia -Improved. T  Bili went from 2.1 -> 1.0 -> 0.9 x2 -Continue to Monitor and Trend -Repeat CMP in the AM   Hyponatremia -Mild and Improving now -Na+ went from 130 -> 134 -> 136 -> 135 -C/w IVF Hydration with NS but resume at 75 mL/hr -Repeat CMP in the AM   Hypokalemia -Mild. K+ went from 4.6 -> 3.2 -> 3.6 -> 3.7 -Replete with po KCl 40 mEQ once -Mag Level was 1.8; Will replete with IV Mag Sulfate 2 grams -Continue to  Monitor and Replete as Necessary -Repeat CMP in the AM   Alcohol Abuse and Dependence with Concern for Withdrawal -Last time he was in the ED EtOH level was 303; This visit was <10 -Reportedly drinks 12-25 drinks daily and wants to quit; Last Drink was 5/31 -C/w IVF Hydration with NS at 125 mL/hr x 18 hours and changed to 75 mL/hr -Placed on CIWA Protocol with po/IV Lorazepam 0-4 mg  q6h followed by q12h and 1-4 mg po/IV [id number]  -C/w Folic Acid 1 mg po Daily, MVI+Minerals 1 tab po Daily and Thiamine 100 m po/IV Daily  HTN -C/w Amlodipine 5 mg po daily and with Metoprolol Tartrate 25 mg po BID -Continue to Monitor BP per Protocol -Last BP was 128/91  Depression/Anxiety and Insomnia -C/w Doxepin 30 mg po qHS and Trazodone 150 mg po qHS  DVT prophylaxis: SCDs given Thrombocytopenia  Code Status: FULL CODE  Family Communication: No family present at bedside but spoke to [employer] via telephone Disposition Plan: Pending further clinical Improvement given that he has hallucinations   Status is: Inpatient  Remains inpatient appropriate because:Unsafe d/c plan, IV treatments appropriate due to intensity of illness or inability to take PO and Inpatient level of care appropriate due to severity of illness   Dispo: The patient is from: Home              Anticipated d/c is to: TBD              Patient currently is not medically stable to d/c.   Difficult to place patient No  Consultants:   None   Procedures: None  Antimicrobials:  Anti-infectives (From admission, onward)   None        Subjective: Seen and examined at bedside and he was more confused today and encephalopathic and confused.  He remains a little lethargic and somnolent but he is extremely confused.  He is only alert and oriented to himself but not situation or place.  He denies any complaint of any pain.  No other concerns or complaints at this time.  Objective: Vitals:   09/10/20 1345 09/10/20 2005 09/11/20  0641 09/11/20 1432  BP: (!) 120/94 137/87 (!) 140/95 (!) 128/91  Pulse: 73 73 93 78  Resp: 16 18 18 18   Temp: 98.6 F (37 C) (!) 97.5 F (36.4 C) 98 F (36.7 C) 98.6 F (37 C)  TempSrc:      SpO2: 100% 99% 99% 99%    Intake/Output Summary (Last 24 hours) at 09/11/2020 1536 Last data filed at 09/11/2020 1300 Gross per 24 hour  Intake 0 ml  Output 1000 ml  Net -1000 ml   There were no vitals filed for this visit.  Examination: Physical Exam:  Constitutional: WN/WD Caucasian male currently no acute distress appears encephalopathic and confused  Eyes: Lids and conjunctivae normal, sclerae anicteric  ENMT: External Ears, Nose appear normal. Grossly normal hearingn.  Neck: Appears normal, supple, no cervical masses, normal ROM, no appreciable  thyromegaly; no JVD Respiratory: Diminished to auscultation bilaterally, no wheezing, rales, rhonchi or crackles. Normal respiratory effort and patient is not tachypenic. No accessory muscle use.  Unlabored breathing Cardiovascular: RRR, no murmurs / rubs / gallops. S1 and S2 auscultated. No extremity edema. 2+ pedal pulses. No carotid bruits.  Abdomen: Soft, non-tender, distended secondary body habitus. No masses palpated. No appreciable hepatosplenomegaly. Bowel sounds positive.  GU: Deferred. Musculoskeletal: No clubbing / cyanosis of digits/nails. No joint deformity upper and lower extremities. Good ROM, no contractures. Normal strength and muscle tone.  Skin: No rashes, lesions, ulcers on limited skin evaluation. No induration; Warm and dry.  Neurologic: CN 2-12 grossly intact with no focal deficits. Romberg sign and cerebellar reflexes not assessed.  Psychiatric: Normal judgment and insight. Alert and oriented x 1. Normal mood and appropriate affect.   Data Reviewed: I have personally reviewed following labs and imaging studies  CBC: Recent Labs  Lab 09/08/20 1729 09/09/20 0415 09/10/20 0512 09/11/20 0629  WBC 6.1 4.3 4.0 3.9*   NEUTROABS 4.5  --  2.3 2.3  HGB 13.5 12.8* 15.0 14.6  HCT 40.7 37.5* 45.2 43.5  MCV 93.6 90.1 91.9 90.8  PLT 63* 61* 84* 163*   Basic Metabolic Panel: Recent Labs  Lab 09/08/20 1729 09/09/20 0415 09/10/20 0512 09/11/20 0629  NA 130* 134* 136 135  K 4.6 3.2* 3.6 3.7  CL 95* 99 102 100  CO2 18* 24 24 26   GLUCOSE 104* 99 92 95  BUN 43* 35* 22 16  CREATININE 2.02* 1.29* 1.17 0.99  CALCIUM 8.9 8.6* 9.0 9.0  MG  --  2.1 1.8 1.8  PHOS  --  2.8 2.9 3.0   GFR: CrCl cannot be calculated (Unknown ideal weight.). Liver Function Tests: Recent Labs  Lab 09/08/20 1729 09/09/20 0415 09/10/20 0512 09/11/20 0629  AST 67* 42* 51* 43*  ALT 51* 46* 55* 50*  ALKPHOS 66 57 59 57  BILITOT 2.1* 1.0 0.9 0.9  PROT 7.5 6.4* 6.9 6.6  ALBUMIN 4.2 3.6 3.8 3.7   No results for input(s): LIPASE, AMYLASE in the last 168 hours. Recent Labs  Lab 09/08/20 1729 09/09/20 1533  AMMONIA 47* 17   Coagulation Profile: No results for input(s): INR, PROTIME in the last 168 hours. Cardiac Enzymes: No results for input(s): CKTOTAL, CKMB, CKMBINDEX, TROPONINI in the last 168 hours. BNP (last 3 results) No results for input(s): PROBNP in the last 8760 hours. HbA1C: No results for input(s): HGBA1C in the last 72 hours. CBG: No results for input(s): GLUCAP in the last 168 hours. Lipid Profile: No results for input(s): CHOL, HDL, LDLCALC, TRIG, CHOLHDL, LDLDIRECT in the last 72 hours. Thyroid Function Tests: No results for input(s): TSH, T4TOTAL, FREET4, T3FREE, THYROIDAB in the last 72 hours. Anemia Panel: Recent Labs    09/11/20 0629  VITAMINB12 906  FOLATE 32.3  FERRITIN 230  TIBC 245*  IRON 30*  RETICCTPCT 1.4   Sepsis Labs: No results for input(s): PROCALCITON, LATICACIDVEN in the last 168 hours.  Recent Results (from the past 240 hour(s))  SARS CORONAVIRUS 2 (TAT 6-24 HRS) Nasopharyngeal Nasopharyngeal Swab     Status: None   Collection Time: 09/08/20 10:18 PM   Specimen:  Nasopharyngeal Swab  Result Value Ref Range Status   SARS Coronavirus 2 NEGATIVE NEGATIVE Final    Comment: (NOTE) SARS-CoV-2 target nucleic acids are NOT DETECTED.  The SARS-CoV-2 RNA is generally detectable in upper and lower respiratory specimens during the acute phase of infection. Negative results  do not preclude SARS-CoV-2 infection, do not rule out co-infections with other pathogens, and should not be used as the sole basis for treatment or other patient management decisions. Negative results must be combined with clinical observations, patient history, and epidemiological information. The expected result is Negative.  Fact Sheet for Patients: SugarRoll.be  Fact Sheet for Healthcare Providers: https://www.woods-mathews.com/  This test is not yet approved or cleared by the [place] FDA and  has been authorized for detection and/or diagnosis of SARS-CoV-2 by FDA under an Emergency Use Authorization (EUA). This EUA will remain  in effect (meaning this test can be used) for the duration of the COVID-19 declaration under Se ction 564(b)(1) of the Act, 21 U.S.C. section 360bbb-3(b)(1), unless the authorization is terminated or revoked sooner.  Performed at [facility], [place] [address] [place], [place] [address]     RN Pressure Injury Documentation:     Estimated body mass index is 29.46 kg/m as calculated from the following:   Height as of 08/25/20: 5\' 11"  (1.803 m).   Weight as of 08/25/20: 95.8 kg.  Malnutrition Type:   Malnutrition Characteristics:   Nutrition Interventions:    Radiology Studies: No results found. Scheduled Meds: . amLODipine  5 mg Oral Daily  . ammonia  1 each Inhalation Once  . doxepin  30 mg Oral QHS  . folic acid  1 mg Oral Daily  . LORazepam  0-4 mg Oral Q12H  . metoprolol tartrate  25 mg Oral BID  . multivitamin with minerals  1 tablet Oral Daily  . thiamine  100 mg Oral Daily   Or   . thiamine  100 mg Intravenous Daily  . traZODone  150 mg Oral QHS   Continuous Infusions: . sodium chloride 75 mL/hr at 09/11/20 0508    LOS: 3 days   Kerney Elbe, DO [facility] PAGER is on AMION  If 7PM-7AM, please contact night-coverage [email or web address]

## 2020-09-11 NOTE — Progress Notes (Signed)
Pt continues to hallucinate and be unsteady during ambulation. Bed alarms are on, will continue to monitor.

## 2020-09-12 LAB — COMPREHENSIVE METABOLIC PANEL
ALT: 51 U/L — ABNORMAL HIGH (ref 0–44)
AST: 38 U/L (ref 15–41)
Albumin: 3.6 g/dL (ref 3.5–5.0)
Alkaline Phosphatase: 56 U/L (ref 38–126)
Anion gap: 7 (ref 5–15)
BUN: 15 mg/dL (ref 8–23)
CO2: 25 mmol/L (ref 22–32)
Calcium: 8.8 mg/dL — ABNORMAL LOW (ref 8.9–10.3)
Chloride: 103 mmol/L (ref 98–111)
Creatinine, Ser: 1.07 mg/dL (ref 0.61–1.24)
GFR, Estimated: >60 mL/min (ref >60)
Glucose, Bld: 123 mg/dL — ABNORMAL HIGH (ref 70–99)
Potassium: 3.4 mmol/L — ABNORMAL LOW (ref 3.5–5.1)
Sodium: 135 mmol/L (ref 135–145)
Total Bilirubin: 0.6 mg/dL (ref 0.3–1.2)
Total Protein: 6.4 g/dL — ABNORMAL LOW (ref 6.5–8.1)

## 2020-09-12 LAB — CBC WITH DIFFERENTIAL/PLATELET
Abs Immature Granulocytes: 0.01 10*3/uL (ref 0.00–0.07)
Basophils Absolute: 0 10*3/uL (ref 0.0–0.1)
Basophils Relative: 1 %
Eosinophils Absolute: 0.2 10*3/uL (ref 0.0–0.5)
Eosinophils Relative: 7 %
HCT: 39.9 % (ref 39.0–52.0)
Hemoglobin: 13.5 g/dL (ref 13.0–17.0)
Immature Granulocytes: 0 %
Lymphocytes Relative: 25 %
Lymphs Abs: 0.9 10*3/uL (ref 0.7–4.0)
MCH: 30.6 pg (ref 26.0–34.0)
MCHC: 33.8 g/dL (ref 30.0–36.0)
MCV: 90.5 fL (ref 80.0–100.0)
Monocytes Absolute: 0.7 10*3/uL (ref 0.1–1.0)
Monocytes Relative: 21 %
Neutro Abs: 1.6 10*3/uL — ABNORMAL LOW (ref 1.7–7.7)
Neutrophils Relative %: 46 %
Platelets: 152 10*3/uL (ref 150–400)
RBC: 4.41 MIL/uL (ref 4.22–5.81)
RDW: 13.8 % (ref 11.5–15.5)
WBC: 3.5 10*3/uL — ABNORMAL LOW (ref 4.0–10.5)
nRBC: 0 % (ref 0.0–0.2)

## 2020-09-12 LAB — PHOSPHORUS: Phosphorus: 3.6 mg/dL (ref 2.5–4.6)

## 2020-09-12 LAB — MAGNESIUM: Magnesium: 1.7 mg/dL (ref 1.7–2.4)

## 2020-09-12 MED ORDER — POLYSACCHARIDE IRON COMPLEX 150 MG PO CAPS
150.0000 mg | ORAL_CAPSULE | Freq: Every day | ORAL | Status: DC
  Administered 2020-09-12 – 2020-09-14 (×3): 150 mg via ORAL
  Filled 2020-09-12 (×3): qty 1

## 2020-09-12 MED ORDER — POTASSIUM CHLORIDE CRYS ER 10 MEQ PO TBCR
40.0000 meq | EXTENDED_RELEASE_TABLET | Freq: Two times a day (BID) | ORAL | Status: AC
  Administered 2020-09-12 (×2): 40 meq via ORAL
  Filled 2020-09-12 (×3): qty 4

## 2020-09-12 MED ORDER — MAGNESIUM SULFATE 2 GM/50ML IV SOLN
2.0000 g | Freq: Once | INTRAVENOUS | Status: AC
  Administered 2020-09-12: 2 g via INTRAVENOUS
  Filled 2020-09-12: qty 50

## 2020-09-12 NOTE — Progress Notes (Signed)
PROGRESS NOTE    Ruben [name]  UMP:[id number] DOB: 18-May-1950 DOA: 09/08/2020 PCP: [name], MD   Brief Narrative:  The patient is a 70 year old male with a past medical history significant for but not limited to alcohol dependence, hypertension, insomnia, history of pulmonary nodule as well as other comorbidities who presented to the emergency department requesting help with alcohol cessation as well as reporting abdominal pain and distention.  EMS notes that family risk and return for increased confusion as well and patient notes that he has issues with abdominal pain and distention for roughly 2 years that is waxing and waning but has not been bothering him for last 2 days.  He had reported that he attempted avoid alcohol after his hospitalized in March before she began drinking again and reports that he could been consuming 12-25 drinks daily mainly vodka each day.  He wants to achieve abstinence and be seen but worries about withdrawal symptoms and his last drink was 09/07/2020.  Family member reported to EMS that he seemed more confused and patient reports occasional confusion but denies any headaches changes in vision or hearing.  Upon arrival to the ED he was found to be afebrile saturating well on room air he is mildly tachycardic with a stable blood pressure.  Head CT was negative for any acute intracranial abnormality CMP was done which showed an elevated creatinine and low sodium.  Patient's ammonia level is also 47.  Platelet count was on the lower side.  He was admitted for acute kidney injury as well as alcohol dependence with concern for withdrawal and currently getting IV fluid hydration and being placed on a CIWA protocol.  Patient's renal function is improving however he is still in alcohol withdrawal and now starting to hallucinate now.  Patient continues to hallucinate and CIWA scores continue to be elevated ranging from 4-15. Yesterday Patient today thought he was in [facility] but is a little bit more awake and alert today   Assessment & Plan:   Principal Problem:   Acute kidney injury ([place]) Active Problems:   Thrombocytopenia (HCC)   Alcohol use disorder, severe, dependence (HCC)   Hyponatremia   Hyperbilirubinemia  AKI, improving  -Improving. Patient's BUN/Cr went from 43/2.02 -> 35/1.29 -> 22/1.17 -> 16/0.99 -> 15/1.07 -He was given IVF with NS at 125 mL/hr but this is expired so we will resume IV fluid hydration with normal saline at 75 MLS per hour and will continue  -Avoid Nephrotoxic Medications, Contrast Dyes, Hypotension, and Renally Adjust Medications -Continue to Monitor and Trend Renal Fxn Carefully -Repeat CMP in the AM   Abnormal LFT's/Elevated LFTs -In the setting of Alcohol Abuse -Improving. Patient's AST went from 67 -> 42 -> 51 -> 43 -> 38 and ALT went from 51 -> 46 -> 55 -> 50 -> 51 -Continue to Monitor and Trend Hepatic Fxn Panel  -Will check a RUQ U/S and Acute Hepatitis Panel in the AM; U/S of the Abdomen showed "Hepatic steatosis. Suboptimal evaluation of the IVC and pancreas due to bowel gas.  Otherwise unremarkable exam." -Repeat CMP in the AM  Normocytic Anemia -Patient's Hgb/Hct went from 13.5/40.7 -> 12.8/37.5 -> 15.0/45.2 -> 14.6/43.5 -> 13.5/39.9 -Check Anemia Panel and showed Iron Level of 30, UBIC 215, TIBC of 245, Saturation Ratios was 12, Ferritin Level of 230, Folate 32.3, Vitamin B12 of 906 -Continue to Monitor for S/Sx of Bleeding; Currently no overt bleeding noted -Repeat CBC in the AM   Thrombocytopenia -  Patient's Platelet Count has gone from 182 on 08/25/20 to 63 on admission and is now slowly improving to 84 -> 117 -> 152 -Continue to Monitor for S/Sx of Bleeding; No overt bleeding noted -Repeat CBC in the AM   Hyperbilirubinemia -Improved. T Bili went from 2.1 -> 1.0 -> 0.9 -> 0.6 -Continue to Monitor and Trend -Repeat CMP in the AM   Hyponatremia -Mild and Improving now -Na+ went from 130 -> 134  -> 136 -> 135 x2 -C/w IVF Hydration with NS but resume at 75 mL/hr -Repeat CMP in the AM   Hypokalemia -Mild. K+ went from 4.6 -> 3.2 -> 3.6 -> 3.7 -> 3.4 -Replete with po KCl 40 mEQ BID x2  -Mag Level was 1.7; Will replete with IV Mag Sulfate 2 grams -Continue to Monitor and Replete as Necessary -Repeat CMP in the AM   Alcohol Abuse and Dependence with Concern for Withdrawal -Last time he was in the ED EtOH level was 303; This visit was <10 -Reportedly drinks 12-25 drinks daily and wants to quit; Last Drink was 5/31 -C/w IVF Hydration with NS at 125 mL/hr x 18 hours and changed to 75 mL/hr -Placed on CIWA Protocol with po/IV Lorazepam 0-4 mg  q6h followed by q12h and 1-4 mg po/IV [id number]  -C/w Folic Acid 1 mg po Daily, MVI+Minerals 1 tab po Daily and Thiamine 100 m po/IV Daily  Hyperglycemia -Likely Reactive -Patient's Blood Sugar was elevated on CMP this AM; Blood Sugars ranging from 92-123 on Daily BMP/CMP -Check HbA1c in the AM  -Continue to Monitor Blood Sugars Carefully and if necessary will place on Sensitive Novolog SSI AC  HTN -C/w Amlodipine 5 mg po daily and with Metoprolol Tartrate 25 mg po BID -Continue to Monitor BP per Protocol -Last BP was 135/85  Depression/Anxiety and Insomnia -C/w Doxepin 30 mg po qHS and Trazodone 150 mg po qHS  DVT prophylaxis: SCDs given Thrombocytopenia  Code Status: FULL CODE  Family Communication: No family present at bedside but spoke to [employer] via telephone yesterday  Disposition Plan: Pending further clinical Improvement given that he has hallucinations   Status is: Inpatient  Remains inpatient appropriate because:Unsafe d/c plan, IV treatments appropriate due to intensity of illness or inability to take PO and Inpatient level of care appropriate due to severity of illness   Dispo: The patient is from: Home              Anticipated d/c is to: TBD              Patient currently is not medically stable to d/c.   Difficult  to place patient No  Consultants:   None   Procedures: None  Antimicrobials:  Anti-infectives (From admission, onward)   None        Subjective: Seen and examined at bedside and somnolent but more awake and alert.  Not as confused today.  But is a little lethargic.  Still has some slight tremors.  No nausea or vomiting.  No other concerns or complaints at this time.  Objective: Vitals:   09/11/20 0641 09/11/20 1432 09/11/20 2050 09/12/20 0514  BP: (!) 140/95 (!) 128/91 129/80 135/85  Pulse: 93 78 70 69  Resp: 18 18 20 15   Temp: 98 F (36.7 C) 98.6 F (37 C) 98.4 F (36.9 C) (!) 97.4 F (36.3 C)  TempSrc:   Oral Oral  SpO2: 99% 99% 99% 95%    Intake/Output Summary (Last 24 hours) at 09/12/2020 1215  Last data filed at 09/12/2020 1009 Gross per 24 hour  Intake 240 ml  Output 1730 ml  Net -1490 ml   There were no vitals filed for this visit.  Examination: Physical Exam:  Constitutional: WN/WD Caucasian male in NAD and appears calm and comfortable Eyes: Lids and conjunctivae normal, sclerae anicteric  ENMT: External Ears, Nose appear normal. Grossly normal hearing. Has some facial flushing/redness Neck: Appears normal, supple, no cervical masses, normal ROM, no appreciable thyromegaly; no JVD Respiratory: Diminished to auscultation bilaterally, no wheezing, rales, rhonchi or crackles. Normal respiratory effort and patient is not tachypenic. No accessory muscle use.  Unlabored breathing Cardiovascular: RRR, no murmurs / rubs / gallops. S1 and S2 auscultated.  Trace extremity edema Abdomen: Soft, non-tender, distended secondary body habitus. Bowel sounds positive.  GU: Deferred. Musculoskeletal: No clubbing / cyanosis of digits/nails. No joint deformity upper and lower extremities.  Skin: No rashes, lesions, ulcers on limited skin evaluation. No induration; Warm and dry.  Neurologic: CN 2-12 grossly intact with no focal deficits. Romberg sign and cerebellar reflexes not  assessed.  Psychiatric: Normal judgment and insight. Alert and oriented x 2. Normal mood and appropriate affect.   Data Reviewed: I have personally reviewed following labs and imaging studies  CBC: Recent Labs  Lab 09/08/20 1729 09/09/20 0415 09/10/20 0512 09/11/20 0629 09/12/20 0601  WBC 6.1 4.3 4.0 3.9* 3.5*  NEUTROABS 4.5  --  2.3 2.3 1.6*  HGB 13.5 12.8* 15.0 14.6 13.5  HCT 40.7 37.5* 45.2 43.5 39.9  MCV 93.6 90.1 91.9 90.8 90.5  PLT 63* 61* 84* 117* 474   Basic Metabolic Panel: Recent Labs  Lab 09/08/20 1729 09/09/20 0415 09/10/20 0512 09/11/20 0629 09/12/20 0601  NA 130* 134* 136 135 135  K 4.6 3.2* 3.6 3.7 3.4*  CL 95* 99 102 100 103  CO2 18* 24 24 26 25   GLUCOSE 104* 99 92 95 123*  BUN 43* 35* 22 16 15   CREATININE 2.02* 1.29* 1.17 0.99 1.07  CALCIUM 8.9 8.6* 9.0 9.0 8.8*  MG  --  2.1 1.8 1.8 1.7  PHOS  --  2.8 2.9 3.0 3.6   GFR: CrCl cannot be calculated (Unknown ideal weight.). Liver Function Tests: Recent Labs  Lab 09/08/20 1729 09/09/20 0415 09/10/20 0512 09/11/20 0629 09/12/20 0601  AST 67* 42* 51* 43* 38  ALT 51* 46* 55* 50* 51*  ALKPHOS 66 57 59 57 56  BILITOT 2.1* 1.0 0.9 0.9 0.6  PROT 7.5 6.4* 6.9 6.6 6.4*  ALBUMIN 4.2 3.6 3.8 3.7 3.6   No results for input(s): LIPASE, AMYLASE in the last 168 hours. Recent Labs  Lab 09/08/20 1729 09/09/20 1533  AMMONIA 47* 17   Coagulation Profile: No results for input(s): INR, PROTIME in the last 168 hours. Cardiac Enzymes: No results for input(s): CKTOTAL, CKMB, CKMBINDEX, TROPONINI in the last 168 hours. BNP (last 3 results) No results for input(s): PROBNP in the last 8760 hours. HbA1C: No results for input(s): HGBA1C in the last 72 hours. CBG: No results for input(s): GLUCAP in the last 168 hours. Lipid Profile: No results for input(s): CHOL, HDL, LDLCALC, TRIG, CHOLHDL, LDLDIRECT in the last 72 hours. Thyroid Function Tests: No results for input(s): TSH, T4TOTAL, FREET4, T3FREE,  THYROIDAB in the last 72 hours. Anemia Panel: Recent Labs    09/11/20 0629  VITAMINB12 906  FOLATE 32.3  FERRITIN 230  TIBC 245*  IRON 30*  RETICCTPCT 1.4   Sepsis Labs: No results for input(s): PROCALCITON, LATICACIDVEN in  the last 168 hours.  Recent Results (from the past 240 hour(s))  SARS CORONAVIRUS 2 (TAT 6-24 HRS) Nasopharyngeal Nasopharyngeal Swab     Status: None   Collection Time: 09/08/20 10:18 PM   Specimen: Nasopharyngeal Swab  Result Value Ref Range Status   SARS Coronavirus 2 NEGATIVE NEGATIVE Final    Comment: (NOTE) SARS-CoV-2 target nucleic acids are NOT DETECTED.  The SARS-CoV-2 RNA is generally detectable in upper and lower respiratory specimens during the acute phase of infection. Negative results do not preclude SARS-CoV-2 infection, do not rule out co-infections with other pathogens, and should not be used as the sole basis for treatment or other patient management decisions. Negative results must be combined with clinical observations, patient history, and epidemiological information. The expected result is Negative.  Fact Sheet for Patients: SugarRoll.be  Fact Sheet for Healthcare Providers: https://www.woods-mathews.com/  This test is not yet approved or cleared by the [place] FDA and  has been authorized for detection and/or diagnosis of SARS-CoV-2 by FDA under an Emergency Use Authorization (EUA). This EUA will remain  in effect (meaning this test can be used) for the duration of the COVID-19 declaration under Se ction 564(b)(1) of the Act, 21 U.S.C. section 360bbb-3(b)(1), unless the authorization is terminated or revoked sooner.  Performed at [facility], [place] [address] [place], [place] [address]     RN Pressure Injury Documentation:     Estimated body mass index is 29.46 kg/m as calculated from the following:   Height as of 08/25/20: 5\' 11"  (1.803 m).   Weight as of 08/25/20:  95.8 kg.  Malnutrition Type:   Malnutrition Characteristics:   Nutrition Interventions:    Radiology Studies: No results found. Scheduled Meds: . amLODipine  5 mg Oral Daily  . ammonia  1 each Inhalation Once  . doxepin  30 mg Oral QHS  . folic acid  1 mg Oral Daily  . iron polysaccharides  150 mg Oral Daily  . LORazepam  0-4 mg Oral Q12H  . metoprolol tartrate  25 mg Oral BID  . multivitamin with minerals  1 tablet Oral Daily  . potassium chloride  40 mEq Oral BID  . thiamine  100 mg Oral Daily   Or  . thiamine  100 mg Intravenous Daily  . traZODone  150 mg Oral QHS   Continuous Infusions: . sodium chloride 75 mL/hr at 09/12/20 0928    LOS: 4 days   Kerney Elbe, DO [facility] PAGER is on AMION  If 7PM-7AM, please contact night-coverage [email or web address]

## 2020-09-13 LAB — COMPREHENSIVE METABOLIC PANEL
ALT: 52 U/L — ABNORMAL HIGH (ref 0–44)
AST: 35 U/L (ref 15–41)
Albumin: 3.4 g/dL — ABNORMAL LOW (ref 3.5–5.0)
Alkaline Phosphatase: 51 U/L (ref 38–126)
Anion gap: 8 (ref 5–15)
BUN: 17 mg/dL (ref 8–23)
CO2: 24 mmol/L (ref 22–32)
Calcium: 9.1 mg/dL (ref 8.9–10.3)
Chloride: 103 mmol/L (ref 98–111)
Creatinine, Ser: 1.09 mg/dL (ref 0.61–1.24)
GFR, Estimated: >60 mL/min (ref >60)
Glucose, Bld: 103 mg/dL — ABNORMAL HIGH (ref 70–99)
Potassium: 4.2 mmol/L (ref 3.5–5.1)
Sodium: 135 mmol/L (ref 135–145)
Total Bilirubin: 0.8 mg/dL (ref 0.3–1.2)
Total Protein: 6.2 g/dL — ABNORMAL LOW (ref 6.5–8.1)

## 2020-09-13 LAB — CBC WITH DIFFERENTIAL/PLATELET
Abs Immature Granulocytes: 0.03 10*3/uL (ref 0.00–0.07)
Basophils Absolute: 0.1 10*3/uL (ref 0.0–0.1)
Basophils Relative: 1 %
Eosinophils Absolute: 0.3 10*3/uL (ref 0.0–0.5)
Eosinophils Relative: 6 %
HCT: 41.9 % (ref 39.0–52.0)
Hemoglobin: 14 g/dL (ref 13.0–17.0)
Immature Granulocytes: 1 %
Lymphocytes Relative: 26 %
Lymphs Abs: 1.1 10*3/uL (ref 0.7–4.0)
MCH: 30.4 pg (ref 26.0–34.0)
MCHC: 33.4 g/dL (ref 30.0–36.0)
MCV: 91.1 fL (ref 80.0–100.0)
Monocytes Absolute: 1.1 10*3/uL — ABNORMAL HIGH (ref 0.1–1.0)
Monocytes Relative: 25 %
Neutro Abs: 1.8 10*3/uL (ref 1.7–7.7)
Neutrophils Relative %: 41 %
Platelets: 164 10*3/uL (ref 150–400)
RBC: 4.6 MIL/uL (ref 4.22–5.81)
RDW: 13.8 % (ref 11.5–15.5)
WBC: 4.3 10*3/uL (ref 4.0–10.5)
nRBC: 0 % (ref 0.0–0.2)

## 2020-09-13 LAB — PHOSPHORUS: Phosphorus: 4 mg/dL (ref 2.5–4.6)

## 2020-09-13 LAB — MAGNESIUM: Magnesium: 2.1 mg/dL (ref 1.7–2.4)

## 2020-09-13 MED ORDER — ENOXAPARIN SODIUM 40 MG/0.4ML IJ SOSY
40.0000 mg | PREFILLED_SYRINGE | INTRAMUSCULAR | Status: DC
  Administered 2020-09-13: 40 mg via SUBCUTANEOUS
  Filled 2020-09-13: qty 0.4

## 2020-09-13 NOTE — Care Management Important Message (Signed)
Important Message  Patient Details IM Letter given to the Patient. Name: Ruben [name] MRN: [id number] Date of Birth: 21-Aug-1950   Medicare Important Message Given:  Yes     Kerin Salen 09/13/2020, 12:18 PM

## 2020-09-13 NOTE — Progress Notes (Signed)
PROGRESS NOTE    Ruben [name]  KZS:[id number] DOB: [birth year] DOA: 09/08/2020 PCP: [name], MD   Brief Narrative:  The patient is a 70 year old male with a past medical history significant for but not limited to alcohol dependence, hypertension, insomnia, history of pulmonary nodule as well as other comorbidities who presented to the emergency department requesting help with alcohol cessation as well as reporting abdominal pain and distention.  EMS notes that family risk and return for increased confusion as well and patient notes that he has issues with abdominal pain and distention for roughly 2 years that is waxing and waning but has not been bothering him for last 2 days.  He had reported that he attempted avoid alcohol after his hospitalized in March before she began drinking again and reports that he could been consuming 12-25 drinks daily mainly vodka each day.  He wants to achieve abstinence and be seen but worries about withdrawal symptoms and his last drink was 09/07/2020.  Family member reported to EMS that he seemed more confused and patient reports occasional confusion but denies any headaches changes in vision or hearing.  Upon arrival to the ED he was found to be afebrile saturating well on room air he is mildly tachycardic with a stable blood pressure.  Head CT was negative for any acute intracranial abnormality CMP was done which showed an elevated creatinine and low sodium.  Patient's ammonia level is also 47.  Platelet count was on the lower side.  He was admitted for acute kidney injury as well as alcohol dependence with concern for withdrawal and currently getting IV fluid hydration and being placed on a CIWA protocol.  Patient's renal function is improving however he was still in alcohol withdrawal and starting to hallucinate.  Patient continued to hallucinate and CIWA scores continued to be elevated ranging from 4-15 however now significantly improved and he is out of alcohol  withdrawals and no longer hallucinating.  He is much more awake and alert and oriented and back to his baseline.  We will await PT OT reevaluation to see if any needs but he is getting close to being medically stable to be discharged and anticipating discharge in the next 24 hours.  Patient's caregiver and cousin is going to drive down from [place] to pick them up and take him back to her house tomorrow likely.  Assessment & Plan:   Principal Problem:   Acute kidney injury [facility]) Active Problems:   Thrombocytopenia (HCC)   Alcohol use disorder, severe, dependence (HCC)   Hyponatremia   Hyperbilirubinemia  AKI, improving  -Improving. Patient's BUN/Cr went from 43/2.02 -> 35/1.29 -> 22/1.17 -> 16/0.99 -> 15/1.07 it is 17/1.09 -IV fluid hydration will not be stopped -Avoid Nephrotoxic Medications, Contrast Dyes, Hypotension, and Renally Adjust Medications -Continue to Monitor and Trend Renal Fxn Carefully -Repeat CMP in the AM   Abnormal LFT's/Elevated LFTs -In the setting of Alcohol Abuse -Improving. Patient's AST went from 67 -> 42 -> 51 -> 43 -> 38 and today it is 35 and ALT went from 51 -> 46 -> 55 -> 50 -> 51 and today it is 52 -Continue to Monitor and Trend Hepatic Fxn Panel  -Will check a RUQ U/S and Acute Hepatitis Panel in the AM; U/S of the Abdomen showed "Hepatic steatosis. Suboptimal evaluation of the IVC and pancreas due to bowel gas.  Otherwise unremarkable exam." -Repeat CMP in the AM  Normocytic Anemia -Patient's Hgb/Hct went from 13.5/40.7 -> 12.8/37.5 -> 15.0/45.2 ->  14.6/43.5 -> 13.5/39.9 and today it is 14.0/41.9 -Check Anemia Panel and showed Iron Level of 30, UBIC 215, TIBC of 245, Saturation Ratios was 12, Ferritin Level of 230, Folate 32.3, Vitamin B12 of 906 -Continue to Monitor for S/Sx of Bleeding; Currently no overt bleeding noted -Repeat CBC in the AM   Thrombocytopenia -Patient's Platelet Count has gone from 182 on 08/25/20 to 63 on admission and is now  slowly improving to 84 -> 117 -> 152 and has improved to 164 -Continue to Monitor for S/Sx of Bleeding; No overt bleeding noted -Repeat CBC in the AM   Hyperbilirubinemia -Improved. T Bili went from 2.1 -> 1.0 -> 0.9 -> 0.6 and today 0.8 -Continue to Monitor and Trend -Repeat CMP in the AM   Hyponatremia -Mild and Improving now -Na+ went from 130 -> 134 -> 136 -> 135 x3 -C/w IVF Hydration with NS but resume at 75 mL/hr -Repeat CMP in the AM   Hypokalemia -Mild. K+ went from 4.6 -> 3.2 -> 3.6 -> 3.7 -> 3.4 and today it is 4.2 -Mag Level was 2.1 -Continue to Monitor and Replete as Necessary -Repeat CMP in the AM   Alcohol Abuse and Dependence with Concern for Withdrawal, improved significantly -Last time he was in the ED EtOH level was 303; This visit was <10 -Reportedly drinks 12-25 drinks daily and wants to quit; Last Drink was 5/31 -IV fluid hydration now stopped -Placed on CIWA Protocol with po/IV Lorazepam 0-4 mg  q6h followed by q12h and 1-4 mg po/IV q1hprn; patient CIWA score this morning was 1 and he is out of withdrawals -C/w Folic Acid 1 mg po Daily, MVI+Minerals 1 tab po Daily and Thiamine 100 m po/IV Daily  Hyperglycemia -Likely Reactive -Patient's Blood Sugar was elevated on CMP this AM; Blood Sugars ranging from 92-123 on Daily BMP/CMP -Check HbA1c in the AM  -Continue to Monitor Blood Sugars Carefully and if necessary will place on Sensitive Novolog SSI AC  HTN -C/w Amlodipine 5 mg po daily and with Metoprolol Tartrate 25 mg po BID -Continue to Monitor BP per Protocol -Last BP was 118/84   Depression/Anxiety and Insomnia -C/w Doxepin 30 mg po qHS and Trazodone 150 mg po qHS  DVT prophylaxis: SCDs given Thrombocytopenia but will now start Lovenox 40 mg sq q24h now that Thrombocytopenia is improved Code Status: FULL CODE  Family Communication: No family present at bedside but spoke to [employer] via telephone yesterday  Disposition Plan: Pending further  clinical Improvement given that he has hallucinations   Status is: Inpatient  Remains inpatient appropriate because:Unsafe d/c plan, IV treatments appropriate due to intensity of illness or inability to take PO and Inpatient level of care appropriate due to severity of illness   Dispo: The patient is from: Home              Anticipated d/c is to: TBD              Patient currently is not medically stable to d/c.   Difficult to place patient No  Consultants:   None   Procedures: None  Antimicrobials:  Anti-infectives (From admission, onward)   None        Subjective: Seen and examined and he is awake and alert and oriented.  No longer in withdrawals.  Feels much better but still weak.  Awaiting PT reevaluation.  No lightheadedness or dizziness.  No other concerns or complaints at this time.  Objective: Vitals:   09/12/20 1405 09/12/20  2022 09/13/20 0429 09/13/20 1332  BP: 120/80 114/72 (!) 137/97 118/84  Pulse: 67 86 65 70  Resp: 17 18 16 16   Temp: 98.6 F (37 C) 98.3 F (36.8 C) 98.1 F (36.7 C) 98 F (36.7 C)  TempSrc:  Oral Oral Oral  SpO2: 97% 100% 96% 96%    Intake/Output Summary (Last 24 hours) at 09/13/2020 1600 Last data filed at 09/13/2020 1537 Gross per 24 hour  Intake 120 ml  Output 2850 ml  Net -2730 ml   There were no vitals filed for this visit.  Examination: Physical Exam:  Constitutional: WN/WD Caucasian male in NAD and appears calm and comfortable Eyes: Lids and conjunctivae normal, sclerae anicteric  ENMT: External Ears, Nose appear normal. Grossly normal hearing.  Neck: Appears normal, supple, no cervical masses, normal ROM, no appreciable thyromegaly; no JVD Respiratory: Diminished to auscultation bilaterally, no wheezing, rales, rhonchi or crackles. Normal respiratory effort and patient is not tachypenic. No accessory muscle use. Unlabored Breathing  Cardiovascular: RRR, no murmurs / rubs / gallops. S1 and S2 auscultated. Trace extremity  edema Abdomen: Soft, non-tender, distended 2/2 to body habitus. Bowel sounds positive.  GU: Deferred. Musculoskeletal: No clubbing / cyanosis of digits/nails. No joint deformity upper and lower extremities.  Skin: No rashes, lesions, ulcers on a limited skin evaluation. No induration; Warm and dry.  Neurologic: CN 2-12 grossly intact with no focal deficits. Romberg sign and cerebellar reflexes not assessed.  Psychiatric: Normal judgment and insight. Alert and oriented x 3. Normal mood and appropriate affect.   Data Reviewed: I have personally reviewed following labs and imaging studies  CBC: Recent Labs  Lab 09/08/20 1729 09/09/20 0415 09/10/20 0512 09/11/20 0629 09/12/20 0601 09/13/20 0532  WBC 6.1 4.3 4.0 3.9* 3.5* 4.3  NEUTROABS 4.5  --  2.3 2.3 1.6* 1.8  HGB 13.5 12.8* 15.0 14.6 13.5 14.0  HCT 40.7 37.5* 45.2 43.5 39.9 41.9  MCV 93.6 90.1 91.9 90.8 90.5 91.1  PLT 63* 61* 84* 117* 152 540   Basic Metabolic Panel: Recent Labs  Lab 09/09/20 0415 09/10/20 0512 09/11/20 0629 09/12/20 0601 09/13/20 0532  NA 134* 136 135 135 135  K 3.2* 3.6 3.7 3.4* 4.2  CL 99 102 100 103 103  CO2 24 24 26 25 24   GLUCOSE 99 92 95 123* 103*  BUN 35* 22 16 15 17   CREATININE 1.29* 1.17 0.99 1.07 1.09  CALCIUM 8.6* 9.0 9.0 8.8* 9.1  MG 2.1 1.8 1.8 1.7 2.1  PHOS 2.8 2.9 3.0 3.6 4.0   GFR: CrCl cannot be calculated (Unknown ideal weight.). Liver Function Tests: Recent Labs  Lab 09/09/20 0415 09/10/20 0512 09/11/20 0629 09/12/20 0601 09/13/20 0532  AST 42* 51* 43* 38 35  ALT 46* 55* 50* 51* 52*  ALKPHOS 57 59 57 56 51  BILITOT 1.0 0.9 0.9 0.6 0.8  PROT 6.4* 6.9 6.6 6.4* 6.2*  ALBUMIN 3.6 3.8 3.7 3.6 3.4*   No results for input(s): LIPASE, AMYLASE in the last 168 hours. Recent Labs  Lab 09/08/20 1729 09/09/20 1533  AMMONIA 47* 17   Coagulation Profile: No results for input(s): INR, PROTIME in the last 168 hours. Cardiac Enzymes: No results for input(s): CKTOTAL, CKMB,  CKMBINDEX, TROPONINI in the last 168 hours. BNP (last 3 results) No results for input(s): PROBNP in the last 8760 hours. HbA1C: No results for input(s): HGBA1C in the last 72 hours. CBG: No results for input(s): GLUCAP in the last 168 hours. Lipid Profile: No results for  input(s): CHOL, HDL, LDLCALC, TRIG, CHOLHDL, LDLDIRECT in the last 72 hours. Thyroid Function Tests: No results for input(s): TSH, T4TOTAL, FREET4, T3FREE, THYROIDAB in the last 72 hours. Anemia Panel: Recent Labs    09/11/20 0629  VITAMINB12 906  FOLATE 32.3  FERRITIN 230  TIBC 245*  IRON 30*  RETICCTPCT 1.4   Sepsis Labs: No results for input(s): PROCALCITON, LATICACIDVEN in the last 168 hours.  Recent Results (from the past 240 hour(s))  SARS CORONAVIRUS 2 (TAT 6-24 HRS) Nasopharyngeal Nasopharyngeal Swab     Status: None   Collection Time: 09/08/20 10:18 PM   Specimen: Nasopharyngeal Swab  Result Value Ref Range Status   SARS Coronavirus 2 NEGATIVE NEGATIVE Final    Comment: (NOTE) SARS-CoV-2 target nucleic acids are NOT DETECTED.  The SARS-CoV-2 RNA is generally detectable in upper and lower respiratory specimens during the acute phase of infection. Negative results do not preclude SARS-CoV-2 infection, do not rule out co-infections with other pathogens, and should not be used as the sole basis for treatment or other patient management decisions. Negative results must be combined with clinical observations, patient history, and epidemiological information. The expected result is Negative.  Fact Sheet for Patients: SugarRoll.be  Fact Sheet for Healthcare Providers: https://www.woods-mathews.com/  This test is not yet approved or cleared by the [place] FDA and  has been authorized for detection and/or diagnosis of SARS-CoV-2 by FDA under an Emergency Use Authorization (EUA). This EUA will remain  in effect (meaning this test can be used) for the  duration of the COVID-19 declaration under Se ction 564(b)(1) of the Act, 21 U.S.C. section 360bbb-3(b)(1), unless the authorization is terminated or revoked sooner.  Performed at [facility], [place] [address] [place], [place] [address]     RN Pressure Injury Documentation:     Estimated body mass index is 29.46 kg/m as calculated from the following:   Height as of 08/25/20: 5\' 11"  (1.803 m).   Weight as of 08/25/20: 95.8 kg.  Malnutrition Type:   Malnutrition Characteristics:   Nutrition Interventions:    Radiology Studies: No results found. Scheduled Meds: . amLODipine  5 mg Oral Daily  . ammonia  1 each Inhalation Once  . doxepin  30 mg Oral QHS  . folic acid  1 mg Oral Daily  . iron polysaccharides  150 mg Oral Daily  . metoprolol tartrate  25 mg Oral BID  . multivitamin with minerals  1 tablet Oral Daily  . thiamine  100 mg Oral Daily   Or  . thiamine  100 mg Intravenous Daily  . traZODone  150 mg Oral QHS   Continuous Infusions: . sodium chloride 75 mL/hr at 09/12/20 2355    LOS: 5 days   Kerney Elbe, DO [facility] PAGER is on [place]  If 7PM-7AM, please contact night-coverage [email or web address]

## 2020-09-13 NOTE — Progress Notes (Signed)
Physical Therapy Treatment Patient Details Name: Ruben [name] MRN: [id number] DOB: 1950/09/17 Today's Date: 09/13/2020    History of Present Illness Patient is a 70 year old male who presented to the emergency department on 09/08/20 requesting help with alcohol cessation, also reports abdominal pain and distention. PMH includes alcohol dependence, hypertension, insomnia, and pulmonary nodule    PT Comments    Pt making good progress. He is alert, oriented, following commands, and improved safety awareness. Ambulating without AD and was steady.  Did have some unsteadiness with higher level balance activities.  Continue to work on mobility and balance.  Do feel that pt has mobility necessary to d/c home with intermittent support from family.     Follow Up Recommendations  Home health PT;Supervision - Intermittent     Equipment Recommendations  None recommended by PT    Recommendations for Other Services       Precautions / Restrictions Precautions Precautions: Fall    Mobility  Bed Mobility Overal bed mobility: Needs Assistance Bed Mobility: Supine to Sit     Supine to sit: HOB elevated;Supervision     General bed mobility comments: use of bed rail with increased time and effort    Transfers Overall transfer level: Needs assistance Equipment used: None Transfers: Sit to/from Stand Sit to Stand: Min guard;Supervision         General transfer comment: performed x 3; min guard progressed to supervision  Ambulation/Gait Ambulation/Gait assistance: Min guard;Supervision Gait Distance (Feet): 300 Feet Assistive device: None Gait Pattern/deviations: Step-through pattern;Decreased stride length     General Gait Details: min guard progressed to close supervision; steady with normal gait, some unsteadiness with challenges (see balance)   Stairs             Wheelchair Mobility    Modified Rankin (Stroke Patients Only)       Balance Overall balance  assessment: Needs assistance;History of Falls Sitting-balance support: No upper extremity supported Sitting balance-Leahy Scale: Normal     Standing balance support: No upper extremity supported Standing balance-Leahy Scale: Good Standing balance comment: ambulation without AD; standing at sink washing face no AD             High level balance activites: Direction changes;Turns;Sudden stops;Head turns High Level Balance Comments: Challenged during gait with head turns, changing speed, stopping, and stepping over object with min guard but no overt LOB.  Standing balance challenges of turning in circle, Rhomberg EO and EC with unsteadiness, reaching outside BOS (only able to do 4"), attempted SLS but pt uanble            Cognition Arousal/Alertness: Awake/alert Behavior During Therapy: WFL for tasks assessed/performed Overall Cognitive Status: Within Functional Limits for tasks assessed                                 General Comments: alert and oriented x 4; following all commands      Exercises      General Comments        Pertinent Vitals/Pain Pain Assessment: No/denies pain    Home Living                      Prior Function            PT Goals (current goals can now be found in the care plan section) Acute Rehab PT Goals Patient Stated Goal: get energy back PT Goal  Formulation: With patient Time For Goal Achievement: 09/27/20 Potential to Achieve Goals: Good Additional Goals Additional Goal #1: Will score >19 on DGI to indicate lower fall risk Progress towards PT goals: Progressing toward goals    Frequency    Min 3X/week      PT Plan Current plan remains appropriate    Co-evaluation              AM-PAC PT "6 Clicks" Mobility   Outcome Measure  Help needed turning from your back to your side while in a flat bed without using bedrails?: None Help needed moving from lying on your back to sitting on the side of a flat  bed without using bedrails?: A Little Help needed moving to and from a bed to a chair (including a wheelchair)?: A Little Help needed standing up from a chair using your arms (e.g., wheelchair or bedside chair)?: A Little Help needed to walk in hospital room?: A Little Help needed climbing 3-5 steps with a railing? : A Little 6 Click Score: 19    End of Session Equipment Utilized During Treatment: Gait belt Activity Tolerance: Patient tolerated treatment well Patient left: in chair;with call bell/phone within reach;with chair alarm set Nurse Communication: Mobility status PT Visit Diagnosis: Difficulty in walking, not elsewhere classified (R26.2)     Time: 6440-3474 PT Time Calculation (min) (ACUTE ONLY): 20 min  Charges:  $Gait Training: 8-22 mins                     Abran Efosa, PT Acute Rehab Services Pager [phone] [facility] [phone]     Karlton Lemon 09/13/2020, 5:58 PM

## 2020-09-13 NOTE — TOC Initial Note (Signed)
Transition of Care [facility]) - Initial/Assessment Note    Patient Details  Name: Ruben [name] MRN: [id number] Date of Birth: 03-12-1951  Transition of Care [facility]) CM/SW Contact:    Trish Mage, LCSW Phone Number: 09/13/2020, 3:23 PM  Clinical Narrative:  Met with patient in follow up to MD consult for substance abuse help.  Ruben [name] readily admits to needing help with alcohol cessation, states the last time he went to stay with cousin in [place] he was 55 days sober for the entire time he was there.  He plans to return there, and gave me permission to call cousin.  Ruben Bufkin was unable to identify any challenges with remaining sober again since he had previously done it, was not interested in any resources. Spoke with cousin Ruben [name], who confirmed plan to have him stay with her, but she had many questions regarding his current medical status as it relates to withdrawal symptoms that I was unable to answer.  MD messaged. TOC will continue to follow during the course of hospitalization.                  Barriers to Discharge: No Barriers Identified   Patient Goals and CMS Choice Patient states their goals for this hospitalization and ongoing recovery are:: "Stay with family in [place], [place]"      Expected Discharge Plan and Services   In-house Referral: Clinical Social Work                                            Prior Living Arrangements/Services     Patient language and need for interpreter reviewed:: Yes        Need for Family Participation in Patient Care: Yes (Comment) Care giver support system in place?: Yes (comment)   Criminal Activity/Legal Involvement Pertinent to Current Situation/Hospitalization: No - Comment as needed  Activities of Daily Living Home Assistive Devices/Equipment: Eyeglasses,Walker (specify type),Other (Comment) (tub/shower unit, front wheeled walker) ADL Screening (condition at time of admission) Patient's cognitive ability adequate to safely  complete daily activities?: Yes Is the patient deaf or have difficulty hearing?: No Does the patient have difficulty seeing, even when wearing glasses/contacts?: No Does the patient have difficulty concentrating, remembering, or making decisions?: Yes (gets confused sometimes) Patient able to express need for assistance with ADLs?: Yes Does the patient have difficulty dressing or bathing?: No Independently performs ADLs?: Yes (appropriate for developmental age) Does the patient have difficulty walking or climbing stairs?: No Weakness of Legs: None Weakness of Arms/Hands: None  Permission Sought/Granted   Permission granted to share information with : Yes, Verbal Permission Granted  Share Information with NAME: Brown,Candy Relative [phone]           Emotional Assessment Appearance:: Appears stated age Attitude/Demeanor/Rapport: Engaged Affect (typically observed): Appropriate Orientation: : Oriented to Self,Oriented to Place,Oriented to Situation Alcohol / Substance Use: Alcohol Use Psych Involvement: No (comment)  Admission diagnosis:  Hallucinations [R44.3] Hyperbilirubinemia [E80.6] Abdominal distension [R14.0] Alcoholism ([place]) [F10.20] Hyponatremia [E87.1] Elevated LFTs [R79.89] AKI (acute kidney injury) ([place]) [N17.9] Acute kidney injury ([place]) [N17.9] Patient Active Problem List   Diagnosis Date Noted  . Elevated LFTs   . Acute kidney injury ([place]) 09/08/2020  . Hyponatremia 09/08/2020  . Hyperbilirubinemia 09/08/2020  . Sinus tachycardia 08/16/2020  . Palpitations 08/16/2020  . Exertional dyspnea 08/16/2020  . Mild renal insufficiency 06/23/2020  .  Weight loss 04/08/2020  . Unsteady gait 04/08/2020  . Insomnia 04/08/2020  . Lump of skin of left upper extremity 04/08/2020  . Irregular heart beat 04/08/2020  . Pure hypercholesterolemia 04/08/2020  . Alcohol use disorder, severe, dependence ([place]) 04/03/2020  . Hyperlipidemia 04/03/2020  . Suicidal ideation  04/03/2020  . Metabolic acidosis 86/14/8307  . Thrombocytopenia ([place]) 12/22/2019  . ARF (acute renal failure) ([place]) 10/01/2019  . Alcohol withdrawal ([place]) 10/01/2019  . Normochromic normocytic anemia 10/01/2019  . GERD (gastroesophageal reflux disease) 06/22/2019  . BPH (benign prostatic hyperplasia) 06/22/2019  . Essential hypertension 06/22/2019  . AKI (acute kidney injury) ([place])   . Alcohol intoxication ([place]) 06/21/2019  . Shoulder joint pain 03/31/2019  . S/P Nissen fundoplication (without gastrostomy tube) procedure 04/26/2018  . Rosacea 06/24/2011   PCP:  [name], MD Pharmacy:   [id number] [place] [place]Phone: [phone]: [phone]    Social Determinants of Health (SDOH) Interventions    Readmission Risk Interventions No flowsheet data found.

## 2020-09-14 LAB — COMPREHENSIVE METABOLIC PANEL
ALT: 52 U/L — ABNORMAL HIGH (ref 0–44)
AST: 35 U/L (ref 15–41)
Albumin: 3.5 g/dL (ref 3.5–5.0)
Alkaline Phosphatase: 49 U/L (ref 38–126)
Anion gap: 9 (ref 5–15)
BUN: 18 mg/dL (ref 8–23)
CO2: 24 mmol/L (ref 22–32)
Calcium: 9.3 mg/dL (ref 8.9–10.3)
Chloride: 101 mmol/L (ref 98–111)
Creatinine, Ser: 1.2 mg/dL (ref 0.61–1.24)
GFR, Estimated: >60 mL/min (ref >60)
Glucose, Bld: 103 mg/dL — ABNORMAL HIGH (ref 70–99)
Potassium: 4 mmol/L (ref 3.5–5.1)
Sodium: 134 mmol/L — ABNORMAL LOW (ref 135–145)
Total Bilirubin: 1 mg/dL (ref 0.3–1.2)
Total Protein: 6.5 g/dL (ref 6.5–8.1)

## 2020-09-14 LAB — CBC WITH DIFFERENTIAL/PLATELET
Abs Immature Granulocytes: 0.02 10*3/uL (ref 0.00–0.07)
Basophils Absolute: 0.1 10*3/uL (ref 0.0–0.1)
Basophils Relative: 1 %
Eosinophils Absolute: 0.2 10*3/uL (ref 0.0–0.5)
Eosinophils Relative: 5 %
HCT: 43.4 % (ref 39.0–52.0)
Hemoglobin: 14.3 g/dL (ref 13.0–17.0)
Immature Granulocytes: 0 %
Lymphocytes Relative: 29 %
Lymphs Abs: 1.3 10*3/uL (ref 0.7–4.0)
MCH: 30.2 pg (ref 26.0–34.0)
MCHC: 32.9 g/dL (ref 30.0–36.0)
MCV: 91.6 fL (ref 80.0–100.0)
Monocytes Absolute: 1.2 10*3/uL — ABNORMAL HIGH (ref 0.1–1.0)
Monocytes Relative: 26 %
Neutro Abs: 1.7 10*3/uL (ref 1.7–7.7)
Neutrophils Relative %: 39 %
Platelets: 213 10*3/uL (ref 150–400)
RBC: 4.74 MIL/uL (ref 4.22–5.81)
RDW: 13.6 % (ref 11.5–15.5)
WBC: 4.5 10*3/uL (ref 4.0–10.5)
nRBC: 0 % (ref 0.0–0.2)

## 2020-09-14 LAB — MAGNESIUM: Magnesium: 1.7 mg/dL (ref 1.7–2.4)

## 2020-09-14 LAB — PHOSPHORUS: Phosphorus: 4 mg/dL (ref 2.5–4.6)

## 2020-09-14 MED ORDER — ONDANSETRON HCL 4 MG PO TABS: 4.0000 mg | ORAL_TABLET | Freq: Four times a day (QID) | ORAL | 0 refills | Status: DC | PRN

## 2020-09-14 MED ORDER — ACETAMINOPHEN 325 MG PO TABS: 650.0000 mg | ORAL_TABLET | Freq: Four times a day (QID) | ORAL | 0 refills | Status: DC | PRN

## 2020-09-14 MED ORDER — THIAMINE HCL 100 MG PO TABS: 100.0000 mg | ORAL_TABLET | Freq: Every day | ORAL | 0 refills | Status: DC

## 2020-09-14 MED ORDER — METOPROLOL TARTRATE 25 MG PO TABS: 25.0000 mg | ORAL_TABLET | Freq: Two times a day (BID) | ORAL | 0 refills | Status: DC

## 2020-09-14 MED ORDER — POLYSACCHARIDE IRON COMPLEX 150 MG PO CAPS
150.0000 mg | ORAL_CAPSULE | Freq: Every day | ORAL | 0 refills | Status: DC
Start: 2020-09-15 — End: 2021-02-08

## 2020-09-14 MED ORDER — MAGNESIUM SULFATE 2 GM/50ML IV SOLN
2.0000 g | Freq: Once | INTRAVENOUS | Status: AC
  Administered 2020-09-14: 2 g via INTRAVENOUS
  Filled 2020-09-14: qty 50

## 2020-09-14 NOTE — Discharge Instructions (Signed)
Acute Kidney Injury, Adult  Acute kidney injury is a sudden worsening of kidney function. The kidneys are organs that have several jobs. They filter the blood to remove waste products and extra fluid. They also maintain a healthy balance of minerals and hormones in the body, which helps control blood pressure and keep bones strong. With this condition, your kidneys do not do their jobs as well as they should. This condition ranges from mild to severe. Over time, it may develop into long-lasting (chronic) kidney disease. Early detection and treatment may prevent acute kidney injury from developing into a chronic condition. What are the causes? Common causes of this condition include:  A problem with blood flow to the kidneys. This may be caused by: ? Low blood pressure (hypotension) or shock. ? Blood loss. ? Heart and blood vessel (cardiovascular) disease. ? Severe burns. ? Liver disease.  Direct damage to the kidneys. This may be caused by: ? Certain medicines. ? A kidney infection. ? Poisoning. ? Being around or in contact with toxic substances. ? A surgical wound. ? A hard, direct hit to the kidney area.  A sudden blockage of urine flow. This may be caused by: ? Cancer. ? Kidney stones. ? An enlarged prostate in males. What increases the risk? You are more likely to develop this condition if you:  Are older than age 65.  Are male.  Are hospitalized, especially if you are in critical condition.  Have certain conditions, such as: ? Chronic kidney disease. ? Diabetes. ? Coronary artery disease and heart failure. ? Pulmonary disease. ? Chronic liver disease. What are the signs or symptoms? Symptoms of this condition may not be obvious until the condition becomes severe. Symptoms of this condition can include:  Tiredness (lethargy) or difficulty staying awake.  Nausea or vomiting.  Swelling (edema) of the face, legs, ankles, or feet.  Problems with urination, such  as: ? Pain in the abdomen, or pain along the side of your stomach (flank). ? Producing little or no urine. ? Passing urine with a weak flow.  Muscle twitches and cramps, especially in the legs.  Confusion or trouble concentrating.  Loss of appetite.  Fever. How is this diagnosed? Your health care provider can diagnose this condition based on your symptoms, medical history, and a physical exam.  You may also have other tests, such as:  Blood tests.  Urine tests.  Imaging tests.  A test in which a sample of tissue is removed from the kidneys to be examined under a microscope (kidney biopsy). How is this treated? Treatment for this condition depends on the cause and how severe the condition is. In mild cases, treatment may not be needed. The kidneys may heal on their own. In more severe cases, treatment will involve:  Treating the cause of the kidney injury. This may involve changing any medicines you are taking or adjusting your dosage.  Fluids. You may need specialized IV fluids to balance your body's needs.  Having a catheter placed to drain urine and prevent blockages.  Preventing problems from occurring. This may mean avoiding certain medicines or procedures that can cause further injury to the kidneys. In some cases, treatment may also require:  A procedure to remove toxic wastes from the body (dialysis or continuous renal replacement therapy, CRRT).  Surgery. This may be done to repair a torn kidney or to remove the blockage from the urinary system. Follow these instructions at home: Medicines  Take over-the-counter and prescription medicines only as   told by your health care provider.  Do not take any new medicines without your health care provider's approval. Many medicines can worsen your kidney damage.  Do not take any vitamin and mineral supplements without your health care provider's approval. Many nutritional supplements can worsen your kidney  damage. Lifestyle  If your health care provider prescribed changes to your diet, follow them. You may need to decrease the amount of protein you eat.  Achieve and maintain a healthy weight. If you need help with this, ask your health care provider.  Start or continue an exercise plan. Try to exercise at least 30 minutes a day, 5 days a week.  Do not use any products that contain nicotine or tobacco, such as cigarettes, e-cigarettes, and chewing tobacco. If you need help quitting, ask your health care provider.   General instructions  Keep track of your blood pressure. Report changes in your blood pressure as told by your health care provider.  Stay up to date with your vaccines. Ask your health care provider which vaccines you need.  Keep all follow-up visits as told by your health care provider. This is important.   Where to find more information  American Association of Kidney Patients: BombTimer.gl  National Kidney Foundation: www.kidney.[place]: https://mathis.com/  Life Options Rehabilitation Program: ? www.lifeoptions.org ? www.kidneyschool.org Contact a health care provider if:  Your symptoms get worse.  You develop new symptoms. Get help right away if:  You develop symptoms of worsening kidney disease, which include: ? Headaches. ? Abnormally dark or light skin. ? Easy bruising. ? Frequent hiccups. ? Chest pain. ? Shortness of breath. ? End of menstruation in women. ? Seizures. ? Confusion or altered mental status. ? Abdominal or back pain. ? Itchiness.  You have a fever.  Your body is producing less urine.  You have pain or bleeding when you urinate. Summary  Acute kidney injury is a sudden worsening of kidney function.  Acute kidney injury can be caused by problems with blood flow to the kidneys, direct damage to the kidneys, and sudden blockage of urine flow.  Symptoms of this condition may not be obvious until it becomes severe.  Symptoms may include edema, lethargy, confusion, nausea or vomiting, and problems passing urine.  This condition can be diagnosed with blood tests, urine tests, and imaging tests. Sometimes a kidney biopsy is done to diagnose this condition.  Treatment for this condition often involves treating the underlying cause. It is treated with fluids, medicines, diet changes, dialysis, or surgery. This information is not intended to replace advice given to you by your health care provider. Make sure you discuss any questions you have with your health care provider. Document Revised: 02/04/2019 Document Reviewed: 02/04/2019 Elsevier Patient Education  2021 [place].   Alcohol Abuse and Dependence Information, Adult Alcohol is a widely available drug. People drink alcohol in different amounts. People who drink alcohol very often and in large amounts often have problems during and after drinking. They may develop what is called an alcohol use disorder. There are two main types of alcohol use disorders:  Alcohol abuse. This is when you use alcohol too much or too often. You may use alcohol to make yourself feel happy or to reduce stress. You may have a hard time setting a limit on the amount you drink.  Alcohol dependence. This is when you use alcohol consistently for a period of time, and your body changes as a result. This can make it  hard to stop drinking because you may start to feel sick or feel different when you do not use alcohol. These symptoms are known as withdrawal. How can alcohol abuse and dependence affect me? Alcohol abuse and dependence can have a negative effect on your life. Drinking too much can lead to addiction. You may feel like you need alcohol to function normally. You may drink alcohol before work in the morning, during the day, or as soon as you get home from work in the evening. These actions can result in:  Poor work performance.  Job loss.  Financial problems.  Car  crashes or criminal charges from driving after drinking alcohol.  Problems in your relationships with friends and family.  Losing the trust and respect of coworkers, friends, and family. Drinking heavily over a long period of time can permanently damage your body and brain, and can cause lifelong health issues, such as:  Damage to your liver or pancreas.  Heart problems, high blood pressure, or stroke.  Certain cancers.  Decreased ability to fight infections.  Brain or nerve damage.  Depression.  Early (premature) death. If you are careless or you crave alcohol, it is easy to drink more than your body can handle (overdose). Alcohol overdose is a serious situation that requires hospitalization. It may lead to permanent injuries or death. What can increase my risk?  Having a family history of alcohol abuse.  Having depression or other mental health conditions.  Beginning to drink at an early age.  Binge drinking often.  Experiencing trauma, stress, and an unstable home life during childhood.  Spending time with people who drink often. What actions can I take to prevent or manage alcohol abuse and dependence?  Do not drink alcohol if: ? Your health care provider tells you not to drink. ? You are pregnant, may be pregnant, or are planning to become pregnant.  If you drink alcohol: ? Limit how much you use to:  0-1 drink a day for women.  0-2 drinks a day for men. ? Be aware of how much alcohol is in your drink. In the [place] one drink equals one 12 oz bottle of beer (355 mL), one 5 oz glass of wine (148 mL), or one 1 oz glass of hard liquor (44 mL).  Stop drinking if you have been drinking too much. This can be very hard to do if you are used to abusing alcohol. If you begin to have withdrawal symptoms, talk with your health care provider or a person that you trust. These symptoms may include anxiety, shaky hands, headache, nausea, sweating, or not being able to  sleep.  Choose to drink nonalcoholic beverages in social gatherings and places where there may be alcohol. Activity  Spend more time on activities that you enjoy that do not involve alcohol, like hobbies or exercise.  Find healthy ways to cope with stress, such as exercise, meditation, or spending time with people you care about. General information  Talk to your family, coworkers, and friends about supporting you in your efforts to stop drinking. If they drink, ask them not to drink around you. Spend more time with people who do not drink alcohol.  If you think that you have an alcohol dependency problem: ? Tell friends or family about your concerns. ? Talk with your health care provider or another health professional about where to get help. ? Work with a Transport planner and a Regulatory affairs officer. ? Consider joining a support group for people who  struggle with alcohol abuse and dependence. Where to find support  Your health care provider.  SMART Recovery: www.smartrecovery.org Therapy and support groups  Local treatment centers or chemical dependency counselors.  Local AA groups in your community: NicTax.com.pt   Where to find more information  Centers for Disease Control and Prevention: http://www.wolf.info/  National Institute on Alcohol Abuse and Alcoholism: http://www.bradshaw.com/  Alcoholics Anonymous (AA): NicTax.com.pt Contact a health care provider if:  You drank more or for longer than you intended on more than one occasion.  You tried to stop drinking or to cut back on how much you drink, but you were not able to.  You often drink to the point of vomiting or passing out.  You want to drink so badly that you cannot think about anything else.  You have problems in your life due to drinking, but you continue to drink.  You keep drinking even though you feel anxious, depressed, or have experienced memory loss.  You have stopped doing the things you used to enjoy in order to  drink.  You have to drink more than you used to in order to get the effect you want.  You experience anxiety, sweating, nausea, shakiness, and trouble sleeping when you try to stop drinking. Get help right away if:  You have thoughts about hurting yourself or others.  You have serious withdrawal symptoms, including: ? Confusion. ? Racing heart. ? High blood pressure. ? Fever. If you ever feel like you may hurt yourself or others, or have thoughts about taking your own life, get help right away. You can go to your nearest emergency department or call:  Your local emergency services (911 in the [place]  A suicide crisis helpline, such as the [facility] at (838)140-0695. This is open 24 hours a day. Summary  Alcohol abuse and dependence can have a negative effect on your life. Drinking too much or too often can lead to addiction.  If you drink alcohol, limit how much you use.  If you are having trouble keeping your drinking under control, find ways to change your behavior. Hobbies, calming activities, exercise, or support groups can help.  If you feel you need help with changing your drinking habits, talk with your health care provider, a good friend, or a therapist, or go to an [place] group. This information is not intended to replace advice given to you by your health care provider. Make sure you discuss any questions you have with your health care provider. Document Revised: 07/16/2018 Document Reviewed: 06/04/2018 Elsevier Patient Education  [place].

## 2020-09-14 NOTE — Progress Notes (Signed)
Discharge instructions given with stated understanding 

## 2020-09-14 NOTE — Discharge Summary (Signed)
Physician Discharge Summary  BLESSING OZGA VQQ:[id number] DOB: 1951-03-14 DOA: 09/08/2020  PCP: [name], MD  Admit date: 09/08/2020 Discharge date: 09/14/2020  Admitted From: Home Disposition: [facility] PT/OT  Recommendations for Outpatient Follow-up:  1. Follow up with PCP in 1-2 weeks 2. Avoid Alcohol 3. Please obtain CMP/CBC, Mag, Phos in one week 4. Please follow up on the following pending results:  Home Health: YES Equipment/Devices: None    Discharge Condition: Stable CODE STATUS: FULL CODE Diet recommendation: Heart Healthy Diet   Brief/Interim Summary: The patient is a 70 year old male with a past medical history significant for but not limited to alcohol dependence, hypertension, insomnia, history of pulmonary nodule as well as other comorbidities who presented to the emergency department requesting help with alcohol cessation as well as reporting abdominal pain and distention.  EMS notes that family risk and return for increased confusion as well and patient notes that he has issues with abdominal pain and distention for roughly 2 years that is waxing and waning but has not been bothering him for last 2 days.  He had reported that he attempted avoid alcohol after his hospitalized in March before she began drinking again and reports that he could been consuming 12-25 drinks daily mainly vodka each day.  He wants to achieve abstinence and be seen but worries about withdrawal symptoms and his last drink was 09/07/2020.  Family member reported to EMS that he seemed more confused and patient reports occasional confusion but denies any headaches changes in vision or hearing.  Upon arrival to the ED he was found to be afebrile saturating well on room air he is mildly tachycardic with a stable blood pressure.  Head CT was negative for any acute intracranial abnormality CMP was done which showed an elevated creatinine and low sodium.  Patient's ammonia level is also 47.   Platelet count was on the lower side.  He was admitted for acute kidney injury as well as alcohol dependence with concern for withdrawal and currently getting IV fluid hydration and being placed on a CIWA protocol.  Patient's renal function is improving however he was still in alcohol withdrawal and starting to hallucinate.  Patient continued to hallucinate and CIWA scores continued to be elevated ranging from 4-15 however now significantly improved and he is out of alcohol withdrawals and no longer hallucinating.  He is much more awake and alert and oriented and back to his baseline.  PT OT recommending home health and he has been deemed medically stable to be discharged given that he is out of his withdrawals. Patient's caregiver and cousin is going to drive down from [place] to pick them up and take him back to her house today  Discharge Diagnoses:  Principal Problem:   Acute kidney injury [facility]) Active Problems:   Thrombocytopenia (HCC)   Alcohol use disorder, severe, dependence (HCC)   Hyponatremia   Hyperbilirubinemia  AKI, improving  -Improving. Patient's BUN/Cr went from 43/2.02 -> 35/1.29 -> 22/1.17 -> 16/0.99 -> 15/1.07 -> 17/1.09 -> 18/1.20 -IV fluid hydration will not be stopped -Avoid Nephrotoxic Medications, Contrast Dyes, Hypotension, and Renally Adjust Medications -Continue to Monitor and Trend Renal Fxn Carefully -Repeat CMP as an outpatient within 1 week  Abnormal LFT's/Elevated LFTs -In the setting of Alcohol Abuse -Improving. Patient's AST went from 67 -> 42 -> 51 -> 43 -> 38 and today it is 35  again and ALT went from 51 -> 46 -> 55 -> 50 -> 51 and  today it is 38 again -Continue to Monitor and Trend Hepatic Fxn Panel  -Will check a RUQ U/S and Acute Hepatitis Panel in the AM; U/S of the Abdomen showed "Hepatic steatosis. Suboptimal evaluation of the IVC and pancreas due to bowel gas.  Otherwise unremarkable exam." -Repeat CMP as an outpatient within 1  week  Normocytic Anemia -Patient's Hgb/Hct went from 13.5/40.7 -> 12.8/37.5 -> 15.0/45.2 -> 14.6/43.5 -> 13.5/39.9 and yesterday it is 14.0/41.9 and today it is 14.3/43.4 -Check Anemia Panel and showed Iron Level of 30, UBIC 215, TIBC of 245, Saturation Ratios was 12, Ferritin Level of 230, Folate 32.3, Vitamin B12 of 906 -Continue to Monitor for S/Sx of Bleeding; Currently no overt bleeding noted -Repeat CBC in the AM   Thrombocytopenia -Patient's Platelet Count has gone from 182 on 08/25/20 to 63 on admission and is now slowly improving to 84 -> 117 -> 152 and has improved to 164 and is now 213 -Continue to Monitor for S/Sx of Bleeding; No overt bleeding noted -Repeat CBC in the AM   Hyperbilirubinemia -Improved. T Bili went from 2.1 -> 1.0 -> 0.9 -> 0.6 and yesterday was 0.8 and today is 1.0 -Continue to Monitor and Trend -Repeat CMP in the AM   Hyponatremia -Mild and Improving now -Na+ went from 130 -> 134 -> 136 -> 135 x3 and this morning was 134 and mild -IV fluid hydration is now stopped -Repeat CMP in the AM   Hypokalemia -Mild. K+ went from 4.6 -> 3.2 -> 3.6 -> 3.7 -> 3.4  and yesterday was 4.2 today is 4.0 -Mag Level was 2.1 and dropped down to 1.7 so will replete prior to discharge -Continue to Monitor and Replete as Necessary -Repeat CMP in the AM   Alcohol Abuse and Dependence with Concern for Withdrawal, improved significantly -Last time he was in the ED EtOH level was 303; This visit was <10 -Reportedly drinks 12-25 drinks daily and wants to quit; Last Drink was 5/31 -IV fluid hydration now stopped -Placed on CIWA Protocol with po/IV Lorazepam 0-4 mg  q6h followed by q12h and 1-4 mg po/IV q1hprn; patient CIWA score this morning was 1 and he is out of withdrawals -C/w Folic Acid 1 mg po Daily, MVI+Minerals 1 tab po Daily and Thiamine 100 m po/IV Daily -Tinea with folate acid, multivitamin minerals and thiamine at discharge now that he is out of  withdrawals  Hyperglycemia -Likely Reactive -Patient's Blood Sugar was elevated on CMP this AM; Blood Sugars ranging from 92-123 on Daily BMP/CMP -Check HbA1c as an outpatient -Continue to Monitor Blood Sugars Carefully and if necessary will place on Sensitive Novolog SSI AC  HTN -C/w Amlodipine 5 mg po daily and with Metoprolol Tartrate 25 mg po BID -Continue to Monitor BP per Protocol -Last BP was  138/92   Depression/Anxiety and Insomnia -C/w Doxepin 30 mg po qHS and Trazodone 150 mg po qHS  Discharge Instructions  Allergies as of 09/14/2020   No Known Allergies     Medication List    TAKE these medications   acetaminophen 325 MG tablet Commonly known as: TYLENOL Take 2 tablets (650 mg total) by mouth every 6 (six) hours as needed for mild pain (or Fever >/= 101).   amLODipine 5 MG tablet Commonly known as: NORVASC Take 1 tablet (5 mg total) by mouth daily.   atorvastatin 20 MG tablet Commonly known as: LIPITOR Take 1 tablet (20 mg total) by mouth daily.   doxepin 10 MG capsule Commonly  known as: SINEQUAN Take 30 mg by mouth at bedtime as needed.   folic acid 1 MG tablet Commonly known as: FOLVITE Take 1 tablet (1 mg total) by mouth daily.   gabapentin 100 MG capsule Commonly known as: NEURONTIN Take 200 mg by mouth 3 (three) times daily.   iron polysaccharides 150 MG capsule Commonly known as: NIFEREX Take 1 capsule (150 mg total) by mouth daily. Start taking on: September 15, 2020   magnesium citrate Soln Take 296 mLs (1 Bottle total) by mouth daily as needed for severe constipation.   metoprolol tartrate 25 MG tablet Commonly known as: LOPRESSOR Take 1 tablet (25 mg total) by mouth 2 (two) times daily.   multivitamin with minerals Tabs tablet Take 1 tablet by mouth daily.   ondansetron 4 MG tablet Commonly known as: ZOFRAN Take 1 tablet (4 mg total) by mouth every 6 (six) hours as needed for nausea.   polyethylene glycol 17 g packet Commonly known  as: MIRALAX / GLYCOLAX Take 17 g by mouth 2 (two) times daily as needed for mild constipation.   senna-docusate 8.6-50 MG tablet Commonly known as: Senokot-S Take 1 tablet by mouth 2 (two) times daily as needed for moderate constipation.   thiamine 100 MG tablet Take 1 tablet (100 mg total) by mouth daily.   traZODone 150 MG tablet Commonly known as: DESYREL Take 150 mg by mouth at bedtime.       No Known Allergies  Consultations:  None  Procedures/Studies: CT Head Wo Contrast  Result Date: 09/08/2020 CLINICAL DATA:  Delirium, weakness, confusion, worsened symptoms, history of hypertension EXAM: CT HEAD WITHOUT CONTRAST TECHNIQUE: Contiguous axial images were obtained from the base of the skull through the vertex without intravenous contrast. Sagittal and coronal MPR images reconstructed from axial data set. COMPARISON:  12/23/2019 FINDINGS: Brain: Minimal atrophy. Normal ventricular morphology. No midline shift or mass effect. Minimal small vessel chronic ischemic changes of deep cerebral white matter. No intracranial hemorrhage, mass lesion, or evidence of acute infarction. No extra-axial fluid collections. Vascular: No hyperdense vessels. Minimal atherosclerotic calcification of internal carotid arteries at skull base. Skull: Intact Sinuses/Orbits: Chronic opacity in RIGHT frontal sinus. Remaining paranasal sinuses and mastoid air cells clear. Other: N/A IMPRESSION: Minimal atrophy and small vessel chronic ischemic changes of deep cerebral white matter. No acute intracranial abnormalities. Electronically Signed   By: Lavonia Dana M.D.   On: 09/08/2020 17:27   US Abdomen Complete  Result Date: 09/09/2020 CLINICAL DATA:  Abdominal distension, elevated liver function tests, acute renal insufficiency EXAM: ABDOMEN ULTRASOUND COMPLETE COMPARISON:  10/01/2019 FINDINGS: Gallbladder: No gallstones or wall thickening visualized. No sonographic Murphy sign noted by sonographer. Common bile duct:  Diameter: 3 mm Liver: Diffuse increased liver echotexture consistent with hepatic steatosis. No focal liver abnormality. No biliary duct dilation. Portal vein is patent on color Doppler imaging with normal direction of blood flow towards the liver. IVC: Not well visualized due to bowel gas. Pancreas: Tail is obscured by bowel gas. Remainder of the pancreas is unremarkable. Spleen: Size and appearance within normal limits. Right Kidney: Length: 10.4 cm. Echogenicity within normal limits. No mass or hydronephrosis visualized. Left Kidney: Length: 11.1 cm. Echogenicity within normal limits. No mass or hydronephrosis visualized. Abdominal aorta: No aneurysm visualized. Other findings: None. IMPRESSION: 1. Hepatic steatosis. 2. Suboptimal evaluation of the IVC and pancreas due to bowel gas. 3. Otherwise unremarkable exam. Electronically Signed   By: Randa Ngo M.D.   On: 09/09/2020 01:00   PCV  ECHOCARDIOGRAM COMPLETE  Result Date: 08/29/2020 Echocardiogram 08/26/2020: Left ventricle cavity is normal in size and wall thickness. Normal global wall motion. Normal LV systolic function with EF 57%. Doppler evidence of grade I (impaired) diastolic dysfunction, normal LAP. No significant valvular abnormality. Trace pericardial effusion. Unable to evaluate PA pressure due to absence of TR and poor visualization of IVC. No significant change compared to previous study in 2018.     Subjective: Seen and examined at bedside and he is back to his baseline.  No nausea or vomiting.  Denies lightheadedness or dizziness.  No other concerns or complaints at this time.  Discharge Exam: Vitals:   09/13/20 1957 09/14/20 0454  BP: 114/85 (!) 138/92  Pulse: 72 78  Resp: 17 19  Temp: 97.9 F (36.6 C) 97.8 F (36.6 C)  SpO2: 98% 100%   Vitals:   09/13/20 0429 09/13/20 1332 09/13/20 1957 09/14/20 0454  BP: (!) 137/97 118/84 114/85 (!) 138/92  Pulse: 65 70 72 78  Resp: 16 16 17 19   Temp: 98.1 F (36.7 C) 98 F (36.7  C) 97.9 F (36.6 C) 97.8 F (36.6 C)  TempSrc: Oral Oral    SpO2: 96% 96% 98% 100%   General: Pt is alert, awake, not in acute distress Cardiovascular: RRR, S1/S2 +, no rubs, no gallops Respiratory: Diminished bilaterally, no wheezing, no rhonchi; unlabored breathing Abdominal: Soft, NT, distended slightly, bowel sounds + Extremities: no edema, no cyanosis  The results of significant diagnostics from this hospitalization (including imaging, microbiology, ancillary and laboratory) are listed below for reference.    Microbiology: Recent Results (from the past 240 hour(s))  SARS CORONAVIRUS 2 (TAT 6-24 HRS) Nasopharyngeal Nasopharyngeal Swab     Status: None   Collection Time: 09/08/20 10:18 PM   Specimen: Nasopharyngeal Swab  Result Value Ref Range Status   SARS Coronavirus 2 NEGATIVE NEGATIVE Final    Comment: (NOTE) SARS-CoV-2 target nucleic acids are NOT DETECTED.  The SARS-CoV-2 RNA is generally detectable in upper and lower respiratory specimens during the acute phase of infection. Negative results do not preclude SARS-CoV-2 infection, do not rule out co-infections with other pathogens, and should not be used as the sole basis for treatment or other patient management decisions. Negative results must be combined with clinical observations, patient history, and epidemiological information. The expected result is Negative.  Fact Sheet for Patients: SugarRoll.be  Fact Sheet for Healthcare Providers: https://www.woods-mathews.com/  This test is not yet approved or cleared by the [place] FDA and  has been authorized for detection and/or diagnosis of SARS-CoV-2 by FDA under an Emergency Use Authorization (EUA). This EUA will remain  in effect (meaning this test can be used) for the duration of the COVID-19 declaration under Se ction 564(b)(1) of the Act, 21 U.S.C. section 360bbb-3(b)(1), unless the authorization is terminated  or revoked sooner.  Performed at [facility], [place] [address], [place], [place] [address]     Labs: BNP (last 3 results) No results for input(s): BNP in the last 8760 hours. Basic Metabolic Panel: Recent Labs  Lab 09/10/20 0512 09/11/20 0629 09/12/20 0601 09/13/20 0532 09/14/20 0521  NA 136 135 135 135 134*  K 3.6 3.7 3.4* 4.2 4.0  CL 102 100 103 103 101  CO2 24 26 25 24 24   GLUCOSE 92 95 123* 103* 103*  BUN 22 16 15 17 18   CREATININE 1.17 0.99 1.07 1.09 1.20  CALCIUM 9.0 9.0 8.8* 9.1 9.3  MG 1.8 1.8 1.7 2.1 1.7  PHOS 2.9 3.0 3.6 4.0 4.0   Liver Function Tests: Recent Labs  Lab 09/10/20 0512 09/11/20 0629 09/12/20 0601 09/13/20 0532 09/14/20 0521  AST 51* 43* 38 35 35  ALT 55* 50* 51* 52* 52*  ALKPHOS 59 57 56 51 49  BILITOT 0.9 0.9 0.6 0.8 1.0  PROT 6.9 6.6 6.4* 6.2* 6.5  ALBUMIN 3.8 3.7 3.6 3.4* 3.5   No results for input(s): LIPASE, AMYLASE in the last 168 hours. Recent Labs  Lab 09/08/20 1729 09/09/20 1533  AMMONIA 47* 17   CBC: Recent Labs  Lab 09/10/20 0512 09/11/20 0629 09/12/20 0601 09/13/20 0532 09/14/20 0521  WBC 4.0 3.9* 3.5* 4.3 4.5  NEUTROABS 2.3 2.3 1.6* 1.8 1.7  HGB 15.0 14.6 13.5 14.0 14.3  HCT 45.2 43.5 39.9 41.9 43.4  MCV 91.9 90.8 90.5 91.1 91.6  PLT 84* 117* 152 164 213   Cardiac Enzymes: No results for input(s): CKTOTAL, CKMB, CKMBINDEX, TROPONINI in the last 168 hours. BNP: Invalid input(s): POCBNP CBG: No results for input(s): GLUCAP in the last 168 hours. D-Dimer No results for input(s): DDIMER in the last 72 hours. Hgb A1c No results for input(s): HGBA1C in the last 72 hours. Lipid Profile No results for input(s): CHOL, HDL, LDLCALC, TRIG, CHOLHDL, LDLDIRECT in the last 72 hours. Thyroid function studies No results for input(s): TSH, T4TOTAL, T3FREE, THYROIDAB in the last 72 hours.  Invalid input(s): FREET3 Anemia work up No results for input(s): VITAMINB12, FOLATE, FERRITIN, TIBC, IRON, RETICCTPCT in  the last 72 hours. Urinalysis    Component Value Date/Time   COLORURINE YELLOW 09/09/2020 1148   APPEARANCEUR CLEAR 09/09/2020 1148   LABSPEC 1.012 09/09/2020 1148   PHURINE 6.0 09/09/2020 1148   GLUCOSEU NEGATIVE 09/09/2020 1148   HGBUR SMALL (A) 09/09/2020 1148   BILIRUBINUR NEGATIVE 09/09/2020 1148   KETONESUR 20 (A) 09/09/2020 1148   PROTEINUR NEGATIVE 09/09/2020 1148   NITRITE NEGATIVE 09/09/2020 1148   LEUKOCYTESUR NEGATIVE 09/09/2020 1148   Sepsis Labs Invalid input(s): PROCALCITONIN,  WBC,  LACTICIDVEN Microbiology Recent Results (from the past 240 hour(s))  SARS CORONAVIRUS 2 (TAT 6-24 HRS) Nasopharyngeal Nasopharyngeal Swab     Status: None   Collection Time: 09/08/20 10:18 PM   Specimen: Nasopharyngeal Swab  Result Value Ref Range Status   SARS Coronavirus 2 NEGATIVE NEGATIVE Final    Comment: (NOTE) SARS-CoV-2 target nucleic acids are NOT DETECTED.  The SARS-CoV-2 RNA is generally detectable in upper and lower respiratory specimens during the acute phase of infection. Negative results do not preclude SARS-CoV-2 infection, do not rule out co-infections with other pathogens, and should not be used as the sole basis for treatment or other patient management decisions. Negative results must be combined with clinical observations, patient history, and epidemiological information. The expected result is Negative.  Fact Sheet for Patients: SugarRoll.be  Fact Sheet for Healthcare Providers: https://www.woods-mathews.com/  This test is not yet approved or cleared by the [place] FDA and  has been authorized for detection and/or diagnosis of SARS-CoV-2 by FDA under an Emergency Use Authorization (EUA). This EUA will remain  in effect (meaning this test can be used) for the duration of the COVID-19 declaration under Se ction 564(b)(1) of the Act, 21 U.S.C. section 360bbb-3(b)(1), unless the authorization is terminated  or revoked sooner.  Performed at [facility], [place] [address] [place], [place] [address]    Time coordinating discharge: 35 minutes  SIGNED:  Kerney Elbe, DO [facility] 09/14/2020, 9:38 AM Pager is  on AMION  If 7PM-7AM, please contact night-coverage [email or web address]

## 2020-09-14 NOTE — Plan of Care (Signed)
  Problem: Education: Goal: Knowledge of General Education information will improve Description: Including pain rating scale, medication(s)/side effects and non-pharmacologic comfort measures Outcome: Adequate for Discharge   Problem: Health Behavior/Discharge Planning: Goal: Ability to manage health-related needs will improve Outcome: Adequate for Discharge   Problem: Clinical Measurements: Goal: Ability to maintain clinical measurements within normal limits will improve Outcome: Adequate for Discharge Goal: Will remain free from infection Outcome: Adequate for Discharge Goal: Diagnostic test results will improve Outcome: Adequate for Discharge Goal: Respiratory complications will improve Outcome: Adequate for Discharge Goal: Cardiovascular complication will be avoided Outcome: Adequate for Discharge   Problem: Activity: Goal: Risk for activity intolerance will decrease Outcome: Adequate for Discharge   Problem: Nutrition: Goal: Adequate nutrition will be maintained Outcome: Adequate for Discharge   Problem: Coping: Goal: Level of anxiety will decrease Outcome: Adequate for Discharge   Problem: Elimination: Goal: Will not experience complications related to bowel motility Outcome: Adequate for Discharge Goal: Will not experience complications related to urinary retention Outcome: Adequate for Discharge   Problem: Pain Managment: Goal: General experience of comfort will improve Outcome: Adequate for Discharge   Problem: Safety: Goal: Ability to remain free from injury will improve Outcome: Adequate for Discharge   Problem: Skin Integrity: Goal: Risk for impaired skin integrity will decrease Outcome: Adequate for Discharge   Problem: Education: Goal: Knowledge of disease or condition will improve Outcome: Adequate for Discharge Goal: Understanding of discharge needs will improve Outcome: Adequate for Discharge   Problem: Health Behavior/Discharge  Planning: Goal: Ability to identify changes in lifestyle to reduce recurrence of condition will improve Outcome: Adequate for Discharge Goal: Identification of resources available to assist in meeting health care needs will improve Outcome: Adequate for Discharge   Problem: Physical Regulation: Goal: Complications related to the disease process, condition or treatment will be avoided or minimized Outcome: Adequate for Discharge   Problem: Safety: Goal: Ability to remain free from injury will improve Outcome: Adequate for Discharge   

## 2020-09-14 NOTE — TOC Transition Note (Signed)
Transition of Care [facility]) - CM/SW Discharge Note   Patient Details  Name: Ruben [name] MRN: [id number] Date of Birth: September 30, 1950  Transition of Care [facility]) CM/SW Contact:  Trish Mage, LCSW Phone Number: 09/14/2020, 11:06 AM   Clinical Narrative:  Patient who is stable for d/c will be going to stay with cousin in [place], she will pick him up at 1:00.  [name] indicates she would like to use [facility].  Spoke with Mateo Flow at [phone] who had me FAX referral to [phone].  No further needs identified. TOC sign off.     Final next level of care: [place] Barriers to Discharge: No Barriers Identified   Patient Goals and CMS Choice Patient states their goals for this hospitalization and ongoing recovery are:: "Stay with family in [place], [place]"      Discharge Placement                       Discharge Plan and Services In-house Referral: Clinical Social Work                                   Social Determinants of Health (SDOH) Interventions     Readmission Risk Interventions No flowsheet data found.

## 2020-09-16 DIAGNOSIS — I10 Essential (primary) hypertension: Secondary | ICD-10-CM | POA: Diagnosis not present

## 2020-09-16 DIAGNOSIS — N179 Acute kidney failure, unspecified: Secondary | ICD-10-CM | POA: Diagnosis not present

## 2020-09-16 DIAGNOSIS — F32A Depression, unspecified: Secondary | ICD-10-CM | POA: Diagnosis not present

## 2020-09-20 DIAGNOSIS — R000 Tachycardia, unspecified: Secondary | ICD-10-CM | POA: Diagnosis not present

## 2020-09-20 DIAGNOSIS — R002 Palpitations: Secondary | ICD-10-CM | POA: Diagnosis not present

## 2020-09-20 DIAGNOSIS — R0609 Other forms of dyspnea: Secondary | ICD-10-CM | POA: Diagnosis not present

## 2020-09-21 DIAGNOSIS — N179 Acute kidney failure, unspecified: Secondary | ICD-10-CM | POA: Diagnosis not present

## 2020-09-21 DIAGNOSIS — I10 Essential (primary) hypertension: Secondary | ICD-10-CM | POA: Diagnosis not present

## 2020-09-21 DIAGNOSIS — F32A Depression, unspecified: Secondary | ICD-10-CM | POA: Diagnosis not present

## 2020-09-22 DIAGNOSIS — F32A Depression, unspecified: Secondary | ICD-10-CM | POA: Diagnosis not present

## 2020-09-22 DIAGNOSIS — I10 Essential (primary) hypertension: Secondary | ICD-10-CM | POA: Diagnosis not present

## 2020-09-22 DIAGNOSIS — N179 Acute kidney failure, unspecified: Secondary | ICD-10-CM | POA: Diagnosis not present

## 2020-09-28 DIAGNOSIS — N179 Acute kidney failure, unspecified: Secondary | ICD-10-CM | POA: Diagnosis not present

## 2020-09-28 DIAGNOSIS — I10 Essential (primary) hypertension: Secondary | ICD-10-CM | POA: Diagnosis not present

## 2020-09-28 DIAGNOSIS — F32A Depression, unspecified: Secondary | ICD-10-CM | POA: Diagnosis not present

## 2020-10-14 DIAGNOSIS — I10 Essential (primary) hypertension: Secondary | ICD-10-CM | POA: Diagnosis not present

## 2020-10-14 DIAGNOSIS — N179 Acute kidney failure, unspecified: Secondary | ICD-10-CM | POA: Diagnosis not present

## 2020-10-14 DIAGNOSIS — F32A Depression, unspecified: Secondary | ICD-10-CM | POA: Diagnosis not present

## 2020-10-21 DIAGNOSIS — N400 Enlarged prostate without lower urinary tract symptoms: Secondary | ICD-10-CM | POA: Diagnosis not present

## 2020-10-21 DIAGNOSIS — I10 Essential (primary) hypertension: Secondary | ICD-10-CM | POA: Diagnosis not present

## 2020-10-21 DIAGNOSIS — G4700 Insomnia, unspecified: Secondary | ICD-10-CM | POA: Diagnosis not present

## 2020-10-21 DIAGNOSIS — F331 Major depressive disorder, recurrent, moderate: Secondary | ICD-10-CM | POA: Diagnosis not present

## 2020-10-21 DIAGNOSIS — E7800 Pure hypercholesterolemia, unspecified: Secondary | ICD-10-CM | POA: Diagnosis not present

## 2020-10-21 DIAGNOSIS — K219 Gastro-esophageal reflux disease without esophagitis: Secondary | ICD-10-CM | POA: Diagnosis not present

## 2020-10-21 DIAGNOSIS — E782 Mixed hyperlipidemia: Secondary | ICD-10-CM | POA: Diagnosis not present

## 2020-10-21 DIAGNOSIS — G8929 Other chronic pain: Secondary | ICD-10-CM | POA: Diagnosis not present

## 2020-10-23 DIAGNOSIS — R443 Hallucinations, unspecified: Secondary | ICD-10-CM | POA: Diagnosis not present

## 2020-11-08 DIAGNOSIS — Z20822 Contact with and (suspected) exposure to covid-19: Secondary | ICD-10-CM | POA: Diagnosis not present

## 2020-12-02 DIAGNOSIS — R140 Abdominal distension (gaseous): Secondary | ICD-10-CM | POA: Diagnosis not present

## 2020-12-02 DIAGNOSIS — R109 Unspecified abdominal pain: Secondary | ICD-10-CM | POA: Diagnosis not present

## 2020-12-02 DIAGNOSIS — Q394 Esophageal web: Secondary | ICD-10-CM | POA: Diagnosis not present

## 2020-12-02 DIAGNOSIS — K219 Gastro-esophageal reflux disease without esophagitis: Secondary | ICD-10-CM | POA: Diagnosis not present

## 2020-12-02 DIAGNOSIS — K5900 Constipation, unspecified: Secondary | ICD-10-CM | POA: Diagnosis not present

## 2020-12-09 DIAGNOSIS — Z20822 Contact with and (suspected) exposure to covid-19: Secondary | ICD-10-CM | POA: Diagnosis not present

## 2020-12-11 DIAGNOSIS — G4089 Other seizures: Secondary | ICD-10-CM | POA: Diagnosis not present

## 2020-12-11 DIAGNOSIS — R4182 Altered mental status, unspecified: Secondary | ICD-10-CM | POA: Diagnosis not present

## 2020-12-11 DIAGNOSIS — R569 Unspecified convulsions: Secondary | ICD-10-CM | POA: Diagnosis not present

## 2020-12-11 DIAGNOSIS — G40901 Epilepsy, unspecified, not intractable, with status epilepticus: Secondary | ICD-10-CM | POA: Diagnosis not present

## 2020-12-11 DIAGNOSIS — R4020 Unspecified coma: Secondary | ICD-10-CM | POA: Diagnosis not present

## 2020-12-11 DIAGNOSIS — R4781 Slurred speech: Secondary | ICD-10-CM | POA: Diagnosis not present

## 2020-12-14 DIAGNOSIS — I491 Atrial premature depolarization: Secondary | ICD-10-CM | POA: Diagnosis not present

## 2020-12-16 ENCOUNTER — Emergency Department (HOSPITAL_COMMUNITY)
Admission: EM | Admit: 2020-12-16 | Discharge: 2020-12-16 | Disposition: A | Discharge status: Home / Self Care | Payer: Medicare Other | Attending: Emergency Medicine | Admitting: Emergency Medicine

## 2020-12-16 ENCOUNTER — Ambulatory Visit: Payer: PRIVATE HEALTH INSURANCE | Admitting: Cardiology

## 2020-12-16 ENCOUNTER — Encounter (HOSPITAL_COMMUNITY): Payer: Self-pay | Admitting: Student

## 2020-12-16 ENCOUNTER — Other Ambulatory Visit: Payer: Self-pay

## 2020-12-16 DIAGNOSIS — R1084 Generalized abdominal pain: Secondary | ICD-10-CM | POA: Insufficient documentation

## 2020-12-16 DIAGNOSIS — Z79899 Other long term (current) drug therapy: Secondary | ICD-10-CM | POA: Insufficient documentation

## 2020-12-16 DIAGNOSIS — F10129 Alcohol abuse with intoxication, unspecified: Secondary | ICD-10-CM | POA: Insufficient documentation

## 2020-12-16 DIAGNOSIS — Y908 Blood alcohol level of 240 mg/100 ml or more: Secondary | ICD-10-CM | POA: Diagnosis not present

## 2020-12-16 DIAGNOSIS — F10920 Alcohol use, unspecified with intoxication, uncomplicated: Secondary | ICD-10-CM

## 2020-12-16 DIAGNOSIS — I10 Essential (primary) hypertension: Secondary | ICD-10-CM | POA: Insufficient documentation

## 2020-12-16 LAB — CBC WITH DIFFERENTIAL/PLATELET
Abs Immature Granulocytes: 0.02 10*3/uL (ref 0.00–0.07)
Basophils Absolute: 0.1 10*3/uL (ref 0.0–0.1)
Basophils Relative: 1 %
Eosinophils Absolute: 0.4 10*3/uL (ref 0.0–0.5)
Eosinophils Relative: 6 %
HCT: 44.5 % (ref 39.0–52.0)
Hemoglobin: 15 g/dL (ref 13.0–17.0)
Immature Granulocytes: 0 %
Lymphocytes Relative: 38 %
Lymphs Abs: 2.4 10*3/uL (ref 0.7–4.0)
MCH: 30.4 pg (ref 26.0–34.0)
MCHC: 33.7 g/dL (ref 30.0–36.0)
MCV: 90.3 fL (ref 80.0–100.0)
Monocytes Absolute: 0.7 10*3/uL (ref 0.1–1.0)
Monocytes Relative: 10 %
Neutro Abs: 2.8 10*3/uL (ref 1.7–7.7)
Neutrophils Relative %: 45 %
Platelets: 181 10*3/uL (ref 150–400)
RBC: 4.93 MIL/uL (ref 4.22–5.81)
RDW: 13.6 % (ref 11.5–15.5)
WBC: 6.3 10*3/uL (ref 4.0–10.5)
nRBC: 0 % (ref 0.0–0.2)

## 2020-12-16 LAB — COMPREHENSIVE METABOLIC PANEL
ALT: 33 U/L (ref 0–44)
AST: 30 U/L (ref 15–41)
Albumin: 4 g/dL (ref 3.5–5.0)
Alkaline Phosphatase: 67 U/L (ref 38–126)
Anion gap: 10 (ref 5–15)
BUN: 17 mg/dL (ref 8–23)
CO2: 25 mmol/L (ref 22–32)
Calcium: 8.9 mg/dL (ref 8.9–10.3)
Chloride: 111 mmol/L (ref 98–111)
Creatinine, Ser: 0.97 mg/dL (ref 0.61–1.24)
GFR, Estimated: >60 mL/min (ref >60)
Glucose, Bld: 94 mg/dL (ref 70–99)
Potassium: 3.9 mmol/L (ref 3.5–5.1)
Sodium: 146 mmol/L — ABNORMAL HIGH (ref 135–145)
Total Bilirubin: 0.5 mg/dL (ref 0.3–1.2)
Total Protein: 7.3 g/dL (ref 6.5–8.1)

## 2020-12-16 LAB — ETHANOL: Alcohol, Ethyl (B): 329 mg/dL (ref <10)

## 2020-12-16 LAB — LIPASE, BLOOD: Lipase: 46 U/L (ref 11–51)

## 2020-12-16 MED ORDER — SODIUM CHLORIDE 0.9 % IV BOLUS
500.0000 mL | Freq: Once | INTRAVENOUS | Status: AC
  Administered 2020-12-16: 500 mL via INTRAVENOUS

## 2020-12-16 MED ORDER — THIAMINE HCL 100 MG/ML IJ SOLN: 100.0000 mg | Freq: Every day | INTRAMUSCULAR | Status: DC

## 2020-12-16 MED ORDER — FAMOTIDINE IN NACL 20-0.9 MG/50ML-% IV SOLN
20.0000 mg | Freq: Once | INTRAVENOUS | Status: AC
  Administered 2020-12-16: 20 mg via INTRAVENOUS
  Filled 2020-12-16: qty 50

## 2020-12-16 MED ORDER — ONDANSETRON HCL 4 MG/2ML IJ SOLN
4.0000 mg | Freq: Once | INTRAMUSCULAR | Status: AC
  Administered 2020-12-16: 4 mg via INTRAVENOUS
  Filled 2020-12-16: qty 2

## 2020-12-16 MED ORDER — LORAZEPAM 2 MG/ML IJ SOLN: 0.0000 mg | Freq: Four times a day (QID) | INTRAMUSCULAR | Status: DC

## 2020-12-16 MED ORDER — LORAZEPAM 1 MG PO TABS: 0.0000 mg | ORAL_TABLET | Freq: Two times a day (BID) | ORAL | Status: DC

## 2020-12-16 MED ORDER — LORAZEPAM 1 MG PO TABS: 0.0000 mg | ORAL_TABLET | Freq: Four times a day (QID) | ORAL | Status: DC

## 2020-12-16 MED ORDER — THIAMINE HCL 100 MG PO TABS: 100.0000 mg | ORAL_TABLET | Freq: Every day | ORAL | Status: DC

## 2020-12-16 MED ORDER — LORAZEPAM 2 MG/ML IJ SOLN: 0.0000 mg | Freq: Two times a day (BID) | INTRAMUSCULAR | Status: DC

## 2020-12-16 MED ORDER — PANTOPRAZOLE SODIUM 20 MG PO TBEC: 20.0000 mg | DELAYED_RELEASE_TABLET | Freq: Every day | ORAL | 0 refills | Status: DC

## 2020-12-16 NOTE — ED Triage Notes (Signed)
Patient called EMS for ETOH and help with alcohol use. Patient lives alone. Patient had 2 large bottles of vodka near his couch.

## 2020-12-16 NOTE — Discharge Instructions (Addendum)
You were seen in the emergency department today for alcohol intoxication.  Your blood work showed that your sodium was mildly elevated, have this rechecked by your primary care provider.  Please see attached resource guide.  Please take protonix daily to help with stomach irritation.  We have prescribed you new medication(s) today. Discuss the medications prescribed today with your pharmacist as they can have adverse effects and interactions with your other medicines including over the counter and prescribed medications. Seek medical evaluation if you start to experience new or abnormal symptoms after taking one of these medicines, seek care immediately if you start to experience difficulty breathing, feeling of your throat closing, facial swelling, or rash as these could be indications of a more serious allergic reaction  Please try to slowly decrease your alcohol intake.    Follow-up with primary care soon as possible.  Return to the emergency department for any new or worsening symptoms including but not limited to new or worsening pain, blood in vomit or stool, inability to keep fluids down, fever, chest pain, trouble breathing, seizure activity, altered mental status/confusion, shaking, or any other concerns.

## 2020-12-16 NOTE — ED Provider Notes (Signed)
Physical Exam  BP 124/67   Pulse 89   Temp (!) 97.5 F (36.4 C)   Resp 18   SpO2 98%   Physical Exam Vitals and nursing note reviewed.  Constitutional:      General: He is not in acute distress.    Appearance: He is well-developed. He is not diaphoretic.  HENT:     Head: Normocephalic and atraumatic.  Eyes:     General: No scleral icterus.    Conjunctiva/sclera: Conjunctivae normal.  Pulmonary:     Effort: Pulmonary effort is normal. No respiratory distress.  Musculoskeletal:     Cervical back: Normal range of motion.  Skin:    Findings: No rash.  Neurological:     Mental Status: He is alert.    ED Course/Procedures     Procedures  MDM   Care of patient assumed from PA Petrucelli at 7 AM.  Agree with history, physical exam and plan.  See their note for further details.  Briefly, 70 y.o. male with PMH/PSH as below who presents with alcohol intoxication.  Reports upset stomach.  Last drink was just prior to arrival.  Drinks 300 cc of vodka per day and has had alcohol withdrawal symptoms in the past.  No vomiting, diarrhea, chest pain. Ethanol level elevated.  CBC unremarkable.  CMP with mild hypernatremia 146.  Past Medical History:  Diagnosis Date   Alcoholism ([place])    Alcoholism ([place])    Aortic atherosclerosis ([place]) 12/26/2016   Noted on CT   Arthritis    BPH (benign prostatic hyperplasia)    Chronic constipation    takes magnesium   Duodenal ulcer    Dyspnea 12/17/2017   ED (erectile dysfunction)    Foley catheter in place    history of no longer in place   GERD (gastroesophageal reflux disease)    Heart palpitations    Occ   Hiatal hernia    History of adenomatous polyp of colon    tubular adenoma's [id number]   History of Helicobacter pylori infection    2008   Hypercholesteremia    Hypertension    MR (mitral regurgitation)    Mild, noted on ECHO   Pre-diabetes    Pulmonary nodule 12/26/2016   noted on CT, 5 x 3 mm nodular opacity right upper lobe    Rosacea    Schatzki's ring    last dilated 02/ 2016   Urinary retention    history of   Varicocele 02/25/2014   Small to moderate left varicocele   Past Surgical History:  Procedure Laterality Date   BALLOON DILATION N/A 06/08/2014   Procedure: BALLOON DILATION;  Surgeon: Garlan Fair, MD;  Location: [facility];  Service: Endoscopy;  Laterality: N/A;   COLONOSCOPY WITH PROPOFOL N/A 06/08/2014   Procedure: COLONOSCOPY WITH PROPOFOL;  Surgeon: Garlan Fair, MD;  Location: [facility];  Service: Endoscopy;  Laterality: N/A;   ESOPHAGOGASTRODUODENOSCOPY N/A 06/08/2014   Procedure: ESOPHAGOGASTRODUODENOSCOPY (EGD);  Surgeon: Garlan Fair, MD;  Location: [facility];  Service: Endoscopy;  Laterality: N/A;   ESOPHAGOGASTRODUODENOSCOPY (EGD) WITH ESOPHAGEAL DILATION N/A 12/31/2012   Procedure: ESOPHAGOGASTRODUODENOSCOPY (EGD) WITH ESOPHAGEAL DILATION;  Surgeon: Garlan Fair, MD;  Location: [facility];  Service: Endoscopy;  Laterality: N/A;   GREEN LIGHT LASER TURP (TRANSURETHRAL RESECTION OF PROSTATE N/A 12/28/2015   Procedure: GREEN LIGHT LASER TURP (TRANSURETHRAL RESECTION OF PROSTATE;  Surgeon: Festus Aloe, MD;  Location: [facility];  Service: Urology;  Laterality: N/A;  Current Plan: Wait for patient to metabolize.  Will p.o. challenge and assess for sobriety.   MDM/ED Course: 10:42 AM Patient appears more sober.  Given Zofran and has not vomited.  He is tolerating p.o. intake here.  Abdominal exams are benign.  Will ambulate and reassess  11:03 AM Ambulating without difficulty.  Appears back to his baseline.  Eating a meal here.  We will have him follow-up with PCP, discharged home.   Consults: None    Significant labs/images: Labs Reviewed  COMPREHENSIVE METABOLIC PANEL - Abnormal; Notable for the following components:      Result Value   Sodium 146 (*)    All other components within normal limits  ETHANOL - Abnormal; Notable for  the following components:   Alcohol, Ethyl (B) 329 (*)    All other components within normal limits  CBC WITH DIFFERENTIAL/PLATELET  LIPASE, BLOOD  URINALYSIS, ROUTINE W REFLEX MICROSCOPIC    I personally reviewed and interpreted all labs.    Patient is hemodynamically stable, in NAD, and able to ambulate in the ED. Evaluation does not show pathology that would require ongoing emergent intervention or inpatient treatment. I explained the diagnosis to the patient. Pain has been managed and has no complaints prior to discharge. Patient is comfortable with above plan and is stable for discharge at this time. All questions were answered prior to disposition. Strict return precautions for returning to the ED were discussed. Encouraged follow up with PCP.   An After Visit Summary was printed and given to the patient.   Portions of this note were generated with Lobbyist. Dictation errors may occur despite best attempts at proofreading.        Delia Heady, PA-C 12/16/20 1104    Teressa Lower, MD 12/16/20 1626

## 2020-12-16 NOTE — ED Notes (Signed)
Patient is in bed able to answer questions. Patient does not complain of SI/HI thoughts. He stated he has had a hard time since his mother passed away and has attended rehab in the past.

## 2020-12-16 NOTE — ED Provider Notes (Signed)
[facility] DEPT Provider Note   CSN: [id number] Arrival date & time: 12/16/20  0236     History Chief Complaint  Patient presents with   Alcohol Intoxication    Ruben [name] is a 70 y.o. male with a history of alcohol abuse, prior duodenal ulcer, hypertension, hypercholesterolemia, GERD, hiatal hernia, thrombocytopenia, and prior Schatzki's ring and Nissen fundoplication who presents to the emergency department via EMS for evaluation of alcohol intoxication.  When asking the patient what brought him into the ER today he states he is not entirely sure, he states he is "old and drunk".  He states that his stomach has been giving him trouble recently, it is not necessarily painful, just gets upset at times.  No specific alleviating or aggravating factors.  His last drink was just prior to arrival.  He states that he drinks approximately 300 mL of vodka per day and does have problems with alcohol withdrawal.  He denies fever, vomiting, diarrhea, melena, hematochezia, dysuria, chest pain, or shortness of breath.  HPI     Past Medical History:  Diagnosis Date   Alcoholism ([place])    Alcoholism ([place])    Aortic atherosclerosis ([place]) 12/26/2016   Noted on CT   Arthritis    BPH (benign prostatic hyperplasia)    Chronic constipation    takes magnesium   Duodenal ulcer    Dyspnea 12/17/2017   ED (erectile dysfunction)    Foley catheter in place    history of no longer in place   GERD (gastroesophageal reflux disease)    Heart palpitations    Occ   Hiatal hernia    History of adenomatous polyp of colon    tubular adenoma's [id number]   History of Helicobacter pylori infection    2008   Hypercholesteremia    Hypertension    MR (mitral regurgitation)    Mild, noted on ECHO   Pre-diabetes    Pulmonary nodule 12/26/2016   noted on CT, 5 x 3 mm nodular opacity right upper lobe   Rosacea    Schatzki's ring    last dilated 02/ 2016   Urinary retention     history of   Varicocele 02/25/2014   Small to moderate left varicocele    Patient Active Problem List   Diagnosis Date Noted   Elevated LFTs    Acute kidney injury ([place]) 09/08/2020   Hyponatremia 09/08/2020   Hyperbilirubinemia 09/08/2020   Sinus tachycardia 08/16/2020   Palpitations 08/16/2020   Exertional dyspnea 08/16/2020   Mild renal insufficiency 06/23/2020   Weight loss 04/08/2020   Unsteady gait 04/08/2020   Insomnia 04/08/2020   Lump of skin of left upper extremity 04/08/2020   Irregular heart beat 04/08/2020   Pure hypercholesterolemia 04/08/2020   Alcohol use disorder, severe, dependence ([place]) 04/03/2020   Hyperlipidemia 04/03/2020   Suicidal ideation 0000000   Metabolic acidosis [id number]   Thrombocytopenia ([place]) 12/22/2019   ARF (acute renal failure) ([place]) 10/01/2019   Alcohol withdrawal ([place]) 10/01/2019   Normochromic normocytic anemia 10/01/2019   GERD (gastroesophageal reflux disease) 06/22/2019   BPH (benign prostatic hyperplasia) 06/22/2019   Essential hypertension 06/22/2019   AKI (acute kidney injury) ([place])    Alcohol intoxication ([place]) 06/21/2019   Shoulder joint pain 03/31/2019   S/P Nissen fundoplication (without gastrostomy tube) procedure 04/26/2018   Rosacea 06/24/2011    Past Surgical History:  Procedure Laterality Date   BALLOON DILATION N/A 06/08/2014   Procedure: BALLOON DILATION;  Surgeon: Garlan Fair, MD;  Location: [facility];  Service: Endoscopy;  Laterality: N/A;   COLONOSCOPY WITH PROPOFOL N/A 06/08/2014   Procedure: COLONOSCOPY WITH PROPOFOL;  Surgeon: Garlan Fair, MD;  Location: [facility];  Service: Endoscopy;  Laterality: N/A;   ESOPHAGOGASTRODUODENOSCOPY N/A 06/08/2014   Procedure: ESOPHAGOGASTRODUODENOSCOPY (EGD);  Surgeon: Garlan Fair, MD;  Location: [facility];  Service: Endoscopy;  Laterality: N/A;   ESOPHAGOGASTRODUODENOSCOPY (EGD) WITH ESOPHAGEAL DILATION N/A 12/31/2012   Procedure:  ESOPHAGOGASTRODUODENOSCOPY (EGD) WITH ESOPHAGEAL DILATION;  Surgeon: Garlan Fair, MD;  Location: [facility];  Service: Endoscopy;  Laterality: N/A;   GREEN LIGHT LASER TURP (TRANSURETHRAL RESECTION OF PROSTATE N/A 12/28/2015   Procedure: GREEN LIGHT LASER TURP (TRANSURETHRAL RESECTION OF PROSTATE;  Surgeon: Festus Aloe, MD;  Location: [facility];  Service: Urology;  Laterality: N/A;       Family History  Problem Relation Age of Onset   Heart failure Mother    Stroke Father     Social History   Tobacco Use   Smoking status: Never   Smokeless tobacco: Never  Vaping Use   Vaping Use: Never used  Substance Use Topics   Alcohol use: Yes    Comment: 12 to 18 ounces vodka daily   Drug use: Yes    Types: Marijuana    Home Medications Prior to Admission medications   Medication Sig Start Date End Date Taking? Authorizing Provider  acetaminophen (TYLENOL) 325 MG tablet Take 2 tablets (650 mg total) by mouth every 6 (six) hours as needed for mild pain (or Fever >/= 101). 09/14/20   Raiford Noble Latif, DO  amLODipine (NORVASC) 5 MG tablet Take 1 tablet (5 mg total) by mouth daily. 12/30/19 12/29/20  Mercy Riding, MD  atorvastatin (LIPITOR) 20 MG tablet Take 1 tablet (20 mg total) by mouth daily. 10/06/19 12/22/19  Alma Friendly, MD  doxepin (SINEQUAN) 10 MG capsule Take 30 mg by mouth at bedtime as needed. 05/12/20   [provider]  folic acid (FOLVITE) 1 MG tablet Take 1 tablet (1 mg total) by mouth daily. 06/29/20   Bonnielee Haff, MD  gabapentin (NEURONTIN) 100 MG capsule Take 200 mg by mouth 3 (three) times daily.    [provider]  iron polysaccharides (NIFEREX) 150 MG capsule Take 1 capsule (150 mg total) by mouth daily. 09/15/20   Raiford Noble Latif, DO  magnesium citrate SOLN Take 296 mLs (1 Bottle total) by mouth daily as needed for severe constipation. 12/30/19   Mercy Riding, MD  metoprolol tartrate (LOPRESSOR) 25 MG tablet Take 1  tablet (25 mg total) by mouth 2 (two) times daily. 09/14/20 10/14/20  Raiford Noble Latif, DO  Multiple Vitamin (MULTIVITAMIN WITH MINERALS) TABS tablet Take 1 tablet by mouth daily. 12/30/19   Mercy Riding, MD  ondansetron (ZOFRAN) 4 MG tablet Take 1 tablet (4 mg total) by mouth every 6 (six) hours as needed for nausea. 09/14/20   Raiford Noble Latif, DO  polyethylene glycol (MIRALAX / GLYCOLAX) 17 g packet Take 17 g by mouth 2 (two) times daily as needed for mild constipation. Patient not taking: Reported on 09/08/2020 12/30/19   Mercy Riding, MD  senna-docusate (SENOKOT-S) 8.6-50 MG tablet Take 1 tablet by mouth 2 (two) times daily as needed for moderate constipation. 12/30/19   Mercy Riding, MD  thiamine 100 MG tablet Take 1 tablet (100 mg total) by mouth daily. 09/14/20   Raiford Noble Latif, DO  traZODone (DESYREL) 150 MG tablet Take 150 mg  by mouth at bedtime. 06/04/20   [provider]    Allergies    Patient has no known allergies.  Review of Systems   Review of Systems  Constitutional:  Negative for chills and fever.  Respiratory:  Negative for shortness of breath.   Cardiovascular:  Negative for chest pain.  Gastrointestinal:  Negative for abdominal pain, blood in stool, diarrhea and vomiting.       Positive for upset stomach at times.   Genitourinary:  Negative for dysuria.  Neurological:  Negative for seizures and syncope.  All other systems reviewed and are negative.  Physical Exam Updated Vital Signs There were no vitals taken for this visit.  Physical Exam Vitals and nursing note reviewed.  Constitutional:      General: He is not in acute distress.    Appearance: He is well-developed. He is not toxic-appearing.  HENT:     Head: Normocephalic and atraumatic.  Eyes:     General:        Right eye: No discharge.        Left eye: No discharge.     Conjunctiva/sclera: Conjunctivae normal.  Cardiovascular:     Rate and Rhythm: Normal rate and regular rhythm.   Pulmonary:     Effort: Pulmonary effort is normal. No respiratory distress.     Breath sounds: Normal breath sounds. No wheezing, rhonchi or rales.  Abdominal:     General: There is no distension.     Palpations: Abdomen is soft.     Tenderness: There is abdominal tenderness. There is no guarding or rebound.     Comments: Mild generalized TTP, distractible.   Musculoskeletal:     Cervical back: Neck supple.  Skin:    General: Skin is warm and dry.     Findings: No rash.  Neurological:     Mental Status: He is alert.     Comments: Clear speech.   Psychiatric:        Behavior: Behavior normal.    ED Results / Procedures / Treatments   Labs (all labs ordered are listed, but only abnormal results are displayed) Labs Reviewed - No data to display  EKG None  Radiology No results found.  Procedures Procedures   Medications Ordered in ED Medications - No data to display  ED Course  I have reviewed the triage vital signs and the nursing notes.  Pertinent labs & imaging results that were available during my care of the patient were reviewed by me and considered in my medical decision making (see chart for details).    MDM Rules/Calculators/A&P                          Patient presents to the ED with alcohol intoxication. Nontoxic, vitals WNL. Mild abdominal tenderness that is distractible, serial abdominal exams nontender.   Additional history obtained:  Additional history obtained from chart review & nursing note review.  Recent admission to [facility] with [facility] for seizure with alcohol withdrawal.  Was placed on CIWA protocol, did not activate CIWA within 36 hours prior to discharge home, was encouraged to start taking his naltrexone previously prescribed  Lab Tests:  I Ordered, reviewed, and interpreted labs, which included:  CBC: Unremarkable CMP: Mild hypernatremia.  Ethanol: elevated.   ED Course:  Serial abdominal  exams without focal tenderness or peritoneal signs.  Patient placed in CIWA given hx of withdrawal, he is currently  sleeping in the emergency department intoxicated, will need to metabolize.  . Findings and plan of care discussed with supervising physician Dr. Roxanne Mins who has evaluated the patient as shared visit & is in agreement.   06:30: Patient care signed out to [facility] @ change of shift pending metabolization & re-assessment.   Portions of this note were generated with Lobbyist. Dictation errors may occur despite best attempts at proofreading.  Final Clinical Impression(s) / ED Diagnoses Final diagnoses:  None    Rx / DC Orders ED Discharge Orders     None        Amaryllis Dyke, PA-C [id number]    Delora Fuel, MD [id number] [phone]

## 2020-12-16 NOTE — ED Notes (Signed)
Patient is asleep.  

## 2020-12-16 NOTE — Progress Notes (Deleted)
Follow up visit  Subjective:   Ruben [name], male    DOB: May 24, 1950, 70 y.o.   MRN: [id number]   HPI   No chief complaint on file.   70 y.o. 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  Previous work-up and showed no significant cardiac abnormalities.  He reports unchanged dyspnea with minimal exertion, such as waxing his car.  He denies any orthopnea, PND, leg edema.  Also denies any chest pain.  His heart rate remains elevated at baseline.  He continues to struggle with alcoholism.  He lives by himself, does not have any immediate family members nearby.  He is retired, and does not have any activities to engage himself.  At baseline, he drinks 700 mL of vodka every day.  Fortunately, he has been on "dry spell" for last 7 days.  He denies any withdrawal symptoms.  Reviewed recent labs performed by PCP, details below.   No current facility-administered medications on file prior to visit.   Current Outpatient Medications on File Prior to Visit  Medication Sig Dispense Refill   acetaminophen (TYLENOL) 325 MG tablet Take 2 tablets (650 mg total) by mouth every 6 (six) hours as needed for mild pain (or Fever >/= 101). 20 tablet 0   amLODipine (NORVASC) 5 MG tablet Take 1 tablet (5 mg total) by mouth daily. 30 tablet 11   atorvastatin (LIPITOR) 20 MG tablet Take 1 tablet (20 mg total) by mouth daily. 30 tablet 0   doxepin (SINEQUAN) 10 MG capsule Take 30 mg by mouth at bedtime as needed.     folic acid (FOLVITE) 1 MG tablet Take 1 tablet (1 mg total) by mouth daily. 30 tablet 0   gabapentin (NEURONTIN) 100 MG capsule Take 200 mg by mouth 3 (three) times daily.     iron polysaccharides (NIFEREX) 150 MG capsule Take 1 capsule (150 mg total) by mouth daily. 30 capsule 0   magnesium citrate SOLN Take 296 mLs (1 Bottle total) by mouth daily as needed for severe  constipation. 195 mL    metoprolol tartrate (LOPRESSOR) 25 MG tablet Take 1 tablet (25 mg total) by mouth 2 (two) times daily. 60 tablet 0   Multiple Vitamin (MULTIVITAMIN WITH MINERALS) TABS tablet Take 1 tablet by mouth daily.     ondansetron (ZOFRAN) 4 MG tablet Take 1 tablet (4 mg total) by mouth every 6 (six) hours as needed for nausea. 20 tablet 0   polyethylene glycol (MIRALAX / GLYCOLAX) 17 g packet Take 17 g by mouth 2 (two) times daily as needed for mild constipation. (Patient not taking: Reported on 09/08/2020) 14 each 0   senna-docusate (SENOKOT-S) 8.6-50 MG tablet Take 1 tablet by mouth 2 (two) times daily as needed for moderate constipation.     thiamine 100 MG tablet Take 1 tablet (100 mg total) by mouth daily. 30 tablet 0   traZODone (DESYREL) 150 MG tablet Take 150 mg by mouth at bedtime.      Cardiovascular & other pertient studies:  EKG 08/16/2020: Sinus tachycardia 110 bpm Frequent PVCs RSR(V1) -nondiagnostic  Echocardiogram 12/2016: Mid LVH. LVEF 55%. Indeterminate diastolic filing Mild MR  CTA 12/2016: No PE Moderate paraesophageal hernia with stomach and portions of colon above the diaphragm. 3.5 x 3 mm nodular opacity right upper lobe.  Event monitor 11/2016: ccasional PAC, no other arrhythmia  Exercise treadmill stress test  01/2014: 7.3 METS No ischemic changes  Recent labs: 08/09/2020: Glucose 101, BUN/Cr 13/1.17. EGFR 68. Na/K 137/3.6. Rest of the CMP normal H/H 14/42. MCV 88. Platelets 143 HbA1C NaA Lipid panel N/A TSH 3.8 normal BNP 78   Review of Systems  Cardiovascular:  Positive for dyspnea on exertion and palpitations. Negative for chest pain, leg swelling and syncope.        There were no vitals filed for this visit.    There is no height or weight on file to calculate BMI. There were no vitals filed for this visit.    Objective:   Physical Exam Vitals and nursing note reviewed.  Constitutional:      General: He is not in acute  distress. Neck:     Vascular: No JVD.  Cardiovascular:     Rate and Rhythm: Regular rhythm. Tachycardia present.     Heart sounds: Normal heart sounds. No murmur heard. Pulmonary:     Effort: Pulmonary effort is normal.     Breath sounds: Normal breath sounds. No wheezing or rales.  Abdominal:     General: Abdomen is protuberant.  Musculoskeletal:     Left lower leg: No edema.          Assessment & Recommendations:   70 y.o. 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  Given his history of heavy alcohol abuse, I will repeat echocardiogram.  Given his resting tachycardia, will also obtain 3-day cardiac telemetry to assess his baseline heart rate and PVC burden.  I congratulated patient on his "dry spell" for 7 days and encouraged him to continue this pill.  I cautioned him against sudden cessation of concern for withdrawal.  I recommend him to continue follow-up with Dr. Jacelyn Grip regarding management of other alcohol related illnesses.  Unless any significant abnormalities found on above cardiac work-up, I will see him on as-needed basis.    [name], MD Pager: [phone] Office: [phone]

## 2020-12-17 DIAGNOSIS — I10 Essential (primary) hypertension: Secondary | ICD-10-CM | POA: Diagnosis not present

## 2020-12-17 DIAGNOSIS — F331 Major depressive disorder, recurrent, moderate: Secondary | ICD-10-CM | POA: Diagnosis not present

## 2020-12-17 DIAGNOSIS — E782 Mixed hyperlipidemia: Secondary | ICD-10-CM | POA: Diagnosis not present

## 2020-12-17 DIAGNOSIS — K219 Gastro-esophageal reflux disease without esophagitis: Secondary | ICD-10-CM | POA: Diagnosis not present

## 2020-12-17 DIAGNOSIS — N400 Enlarged prostate without lower urinary tract symptoms: Secondary | ICD-10-CM | POA: Diagnosis not present

## 2020-12-17 DIAGNOSIS — E7800 Pure hypercholesterolemia, unspecified: Secondary | ICD-10-CM | POA: Diagnosis not present

## 2020-12-17 DIAGNOSIS — G8929 Other chronic pain: Secondary | ICD-10-CM | POA: Diagnosis not present

## 2020-12-17 DIAGNOSIS — G4700 Insomnia, unspecified: Secondary | ICD-10-CM | POA: Diagnosis not present

## 2020-12-18 DIAGNOSIS — J80 Acute respiratory distress syndrome: Secondary | ICD-10-CM | POA: Diagnosis not present

## 2020-12-18 DIAGNOSIS — R569 Unspecified convulsions: Secondary | ICD-10-CM | POA: Diagnosis not present

## 2020-12-18 DIAGNOSIS — R000 Tachycardia, unspecified: Secondary | ICD-10-CM | POA: Diagnosis not present

## 2020-12-18 DIAGNOSIS — R61 Generalized hyperhidrosis: Secondary | ICD-10-CM | POA: Diagnosis not present

## 2020-12-18 DIAGNOSIS — R55 Syncope and collapse: Secondary | ICD-10-CM | POA: Diagnosis not present

## 2020-12-20 DIAGNOSIS — R000 Tachycardia, unspecified: Secondary | ICD-10-CM | POA: Diagnosis not present

## 2020-12-20 DIAGNOSIS — R55 Syncope and collapse: Secondary | ICD-10-CM | POA: Diagnosis not present

## 2021-02-08 ENCOUNTER — Ambulatory Visit: Payer: Medicare Other | Admitting: Student

## 2021-02-08 ENCOUNTER — Encounter: Payer: Self-pay | Admitting: Student

## 2021-02-08 ENCOUNTER — Other Ambulatory Visit: Payer: Self-pay

## 2021-02-08 VITALS — BP 105/67 | HR 73 | Temp 98.1°F | Resp 17 | Ht 71.0 in | Wt 203.0 lb

## 2021-02-08 DIAGNOSIS — R0609 Other forms of dyspnea: Secondary | ICD-10-CM

## 2021-02-08 DIAGNOSIS — R002 Palpitations: Secondary | ICD-10-CM

## 2021-02-08 NOTE — Progress Notes (Signed)
Follow up visit  Subjective:   Ruben [name], male    DOB: [birth year], 70 y.o.   MRN: [id number]   HPI   Chief Complaint  Patient presents with   Follow-up   Shortness of Breath    70 y.o. Caucasian male with h/o alcohol abuse, hiatal hernia, originally referred for evaluation of tachycardia and concern for alcoholic cardiomyopathy  Was last seen by Dr. Virgina Jock in May 2022 for dyspnea on exertion and tachycardia.  Cardiac monitor at that time revealed PVCs with approximately 2% burden and echocardiogram was without significant abnormalities.  Patient was therefore advised to follow-up as needed.  He has now requested an appointment today for dyspnea on exertion ongoing for the last several years, which she reports is unchanged compared to previous.  He is also concerned that his abdomen is "tight and uncomfortable".  This has also been ongoing for several years and he has been evaluated by PCP.  Patient denies chest pain, syncope, near syncope.  Denies orthopnea, PND, leg edema.  Patient is congratulated as he is not consumed alcohol in the last 45 days.  He notably recently completed a rehabilitation program.  Patient is an active without formal exercise routine.  Current Outpatient Medications on File Prior to Visit  Medication Sig Dispense Refill   amLODipine (NORVASC) 5 MG tablet Take 1 tablet (5 mg total) by mouth daily. 30 tablet 11   atorvastatin (LIPITOR) 20 MG tablet Take 1 tablet (20 mg total) by mouth daily. 30 tablet 0   FLUoxetine (PROZAC) 10 MG tablet Take 10 mg by mouth daily.     folic acid (FOLVITE) 1 MG tablet Take 1 tablet (1 mg total) by mouth daily. 30 tablet 0   hydrOXYzine (VISTARIL) 50 MG capsule Take 50 mg by mouth at bedtime.     ibuprofen (ADVIL) 200 MG tablet Take 1 tablet by mouth as needed.     magnesium citrate SOLN Take 296 mLs (1 Bottle total) by mouth daily as needed for severe constipation. 195 mL    metoprolol tartrate (LOPRESSOR) 25 MG tablet  Take 1 tablet (25 mg total) by mouth 2 (two) times daily. (Patient taking differently: Take 50 mg by mouth 2 (two) times daily.) 60 tablet 0   Multiple Vitamin (MULTIVITAMIN WITH MINERALS) TABS tablet Take 1 tablet by mouth daily.     naltrexone (DEPADE) 50 MG tablet Take 50 mg by mouth daily.     ondansetron (ZOFRAN) 4 MG tablet Take 1 tablet (4 mg total) by mouth every 6 (six) hours as needed for nausea. 20 tablet 0   QUEtiapine (SEROQUEL) 100 MG tablet Take 100 mg by mouth at bedtime.     senna-docusate (SENOKOT-S) 8.6-50 MG tablet Take 1 tablet by mouth 2 (two) times daily as needed for moderate constipation.     traZODone (DESYREL) 150 MG tablet Take 150 mg by mouth at bedtime.     No current facility-administered medications on file prior to visit.    Cardiovascular & other pertient studies: EKG 02/08/2021:  Sinus rhythm at a rate of 55 bpm.  Normal axis.  No evidence of ischemia or underlying injury pattern.  EKG 08/16/2020: Sinus tachycardia 110 bpm Frequent PVCs RSR(V1) -nondiagnostic  Echocardiogram 12/2016: Mid LVH. LVEF 55%. Indeterminate diastolic filing Mild MR  CTA 12/2016: No PE Moderate paraesophageal hernia with stomach and portions of colon above the diaphragm. 3.5 x 3 mm nodular opacity right upper lobe.  Event monitor 11/2016: ccasional PAC, no other arrhythmia  Exercise treadmill stress test 01/2014: 7.3 METS No ischemic changes  Recent labs: 12/18/2020: Hgb 15, HCT 44.5, MCV 89.6, platelet 170 Sodium 144, potassium 4.0, BUN 10, creatinine 0.89, GFR >60  08/09/2020: Glucose 101, BUN/Cr 13/1.17. EGFR 68. Na/K 137/3.6. Rest of the CMP normal H/H 14/42. MCV 88. Platelets 143 HbA1C NaA Lipid panel N/A TSH 3.8 normal BNP 78   Review of Systems  Constitutional: Negative for malaise/fatigue and weight gain.  Cardiovascular:  Positive for dyspnea on exertion (unchanged) and palpitations (stable). Negative for chest pain, claudication, leg swelling,  near-syncope, orthopnea, paroxysmal nocturnal dyspnea and syncope.  Respiratory:  Negative for shortness of breath.   Gastrointestinal:  Positive for abdominal pain.  Neurological:  Negative for dizziness.        Vitals:   02/08/21 1450  BP: 105/67  Pulse: 73  Resp: 17  Temp: 98.1 F (36.7 C)  SpO2: 95%     Body mass index is 28.31 kg/m. Filed Weights   02/08/21 1450  Weight: 203 lb (92.1 kg)     Objective:   Physical Exam Vitals and nursing note reviewed.  Constitutional:      General: He is not in acute distress. Neck:     Vascular: No JVD.  Cardiovascular:     Rate and Rhythm: Normal rate and regular rhythm.     Heart sounds: Normal heart sounds. No murmur heard. Pulmonary:     Effort: Pulmonary effort is normal.     Breath sounds: Normal breath sounds. No wheezing or rales.  Abdominal:     General: Abdomen is protuberant.  Musculoskeletal:     Right lower leg: No edema.     Left lower leg: No edema.       Assessment & Recommendations:   70 y.o. Caucasian male with h/o alcohol abuse, hiatal hernia, originally referred for evaluation of tachycardia and concern for alcoholic cardiomyopathy.   Patient presents for symptoms which are unchanged compared to last office visit in May at which time work-up was unremarkable, details above.  Given patient's concerns for exertional dyspnea as well as abdominal discomfort and bloating will obtain BNP and CMP.  Patient is anxious regarding symptoms, therefore although my suspicion is low for ischemic heart disease shared decision was to proceed with coronary calcium score for further risk stratification.  If labs and coronary calcium score are without significant abnormality patient may follow-up as needed.  Further recommendations pending cardiac testing.   Alethia Berthold, PA-C 02/08/2021, 3:35 PM Office: [phone]

## 2021-02-09 DIAGNOSIS — R0609 Other forms of dyspnea: Secondary | ICD-10-CM | POA: Diagnosis not present

## 2021-02-10 LAB — CMP14+EGFR
ALT: 19 IU/L (ref 0–44)
AST: 18 IU/L (ref 0–40)
Albumin/Globulin Ratio: 1.7 (ref 1.2–2.2)
Albumin: 4.4 g/dL (ref 3.8–4.8)
Alkaline Phosphatase: 85 IU/L (ref 44–121)
BUN/Creatinine Ratio: 12 (ref 10–24)
BUN: 16 mg/dL (ref 8–27)
Bilirubin Total: 0.6 mg/dL (ref 0.0–1.2)
CO2: 19 mmol/L — ABNORMAL LOW (ref 20–29)
Calcium: 9.4 mg/dL (ref 8.6–10.2)
Chloride: 101 mmol/L (ref 96–106)
Creatinine, Ser: 1.29 mg/dL — ABNORMAL HIGH (ref 0.76–1.27)
Globulin, Total: 2.6 g/dL (ref 1.5–4.5)
Glucose: 96 mg/dL (ref 70–99)
Potassium: 4.9 mmol/L (ref 3.5–5.2)
Sodium: 138 mmol/L (ref 134–144)
Total Protein: 7 g/dL (ref 6.0–8.5)
eGFR: 60 mL/min/{1.73_m2} (ref >59)

## 2021-02-10 LAB — BRAIN NATRIURETIC PEPTIDE: BNP: 56.2 pg/mL (ref 0.0–100.0)

## 2021-02-15 DIAGNOSIS — N400 Enlarged prostate without lower urinary tract symptoms: Secondary | ICD-10-CM | POA: Diagnosis not present

## 2021-02-15 DIAGNOSIS — K219 Gastro-esophageal reflux disease without esophagitis: Secondary | ICD-10-CM | POA: Diagnosis not present

## 2021-02-15 DIAGNOSIS — I10 Essential (primary) hypertension: Secondary | ICD-10-CM | POA: Diagnosis not present

## 2021-02-15 DIAGNOSIS — G4700 Insomnia, unspecified: Secondary | ICD-10-CM | POA: Diagnosis not present

## 2021-02-15 DIAGNOSIS — F331 Major depressive disorder, recurrent, moderate: Secondary | ICD-10-CM | POA: Diagnosis not present

## 2021-02-15 DIAGNOSIS — E782 Mixed hyperlipidemia: Secondary | ICD-10-CM | POA: Diagnosis not present

## 2021-02-15 DIAGNOSIS — G8929 Other chronic pain: Secondary | ICD-10-CM | POA: Diagnosis not present

## 2021-02-15 DIAGNOSIS — E7800 Pure hypercholesterolemia, unspecified: Secondary | ICD-10-CM | POA: Diagnosis not present

## 2021-02-15 NOTE — Progress Notes (Signed)
Called pt no answer, left a vm and send the results to pcp

## 2021-02-18 DIAGNOSIS — R972 Elevated prostate specific antigen [PSA]: Secondary | ICD-10-CM | POA: Diagnosis not present

## 2021-02-18 DIAGNOSIS — N3943 Post-void dribbling: Secondary | ICD-10-CM | POA: Diagnosis not present

## 2021-02-18 DIAGNOSIS — N401 Enlarged prostate with lower urinary tract symptoms: Secondary | ICD-10-CM | POA: Diagnosis not present

## 2021-02-18 NOTE — Progress Notes (Signed)
Called and spoke with patient regarding his recent lab results.

## 2021-03-01 ENCOUNTER — Other Ambulatory Visit: Payer: Self-pay | Admitting: Student

## 2021-03-01 ENCOUNTER — Ambulatory Visit
Admission: RE | Admit: 2021-03-01 | Discharge: 2021-03-01 | Disposition: A | Discharge status: Home / Self Care | Payer: No Typology Code available for payment source | Source: Ambulatory Visit | Attending: Student | Admitting: Student

## 2021-03-01 DIAGNOSIS — I2510 Atherosclerotic heart disease of native coronary artery without angina pectoris: Secondary | ICD-10-CM

## 2021-03-01 DIAGNOSIS — E785 Hyperlipidemia, unspecified: Secondary | ICD-10-CM | POA: Diagnosis not present

## 2021-03-01 DIAGNOSIS — Z8249 Family history of ischemic heart disease and other diseases of the circulatory system: Secondary | ICD-10-CM | POA: Diagnosis not present

## 2021-03-02 NOTE — Progress Notes (Signed)
Called patient, Ruben [name], LMAM

## 2021-03-08 NOTE — Progress Notes (Signed)
Called and spoke to pt, pt voiced understanding.

## 2021-03-11 DIAGNOSIS — R1084 Generalized abdominal pain: Secondary | ICD-10-CM | POA: Diagnosis not present

## 2021-03-11 DIAGNOSIS — E782 Mixed hyperlipidemia: Secondary | ICD-10-CM | POA: Diagnosis not present

## 2021-03-11 DIAGNOSIS — K5901 Slow transit constipation: Secondary | ICD-10-CM | POA: Diagnosis not present

## 2021-03-26 DIAGNOSIS — Z20828 Contact with and (suspected) exposure to other viral communicable diseases: Secondary | ICD-10-CM | POA: Diagnosis not present

## 2021-04-05 ENCOUNTER — Ambulatory Visit: Payer: Medicare Other | Admitting: Student

## 2021-04-05 ENCOUNTER — Other Ambulatory Visit: Payer: Self-pay

## 2021-04-05 ENCOUNTER — Encounter: Payer: Self-pay | Admitting: Student

## 2021-04-05 VITALS — BP 97/66 | HR 106 | Ht 71.0 in | Wt 207.0 lb

## 2021-04-05 DIAGNOSIS — R0609 Other forms of dyspnea: Secondary | ICD-10-CM | POA: Diagnosis not present

## 2021-04-05 DIAGNOSIS — I959 Hypotension, unspecified: Secondary | ICD-10-CM | POA: Diagnosis not present

## 2021-04-05 NOTE — Progress Notes (Signed)
Follow up visit  Subjective:   LOGON UTTECH, male    DOB: [birth year], 70 y.o.   MRN: [id number]   HPI   Chief Complaint  Patient presents with   exertiona ldyspnea   Follow-up    70 y.o. Caucasian male with h/o alcohol abuse, hiatal hernia, originally referred for evaluation of tachycardia and concern for alcoholic cardiomyopathy. Following cardiac evaluation which was unremarkable patient was advised to follow-up as needed.  Patient was seen 02/08/2021 with complaints of exertional dyspnea.  On last visit ordered BNP which was normal and CMP revealed normal liver function with increased creatinine compared to 12/2020.  Patient was therefore advised to follow-up with PCP regarding creatinine.  At last office visit also ordered coronary calcium scoring which noted total coronary calcium score of 35.8 (all in the LAD) which places patient in 31 percentile based on age and sex.  CT also noted aortic atherosclerosis, small hiatal hernia, and bilateral gynecomastia.  Patient is without complaints today, reports mild improvement of exertional dyspnea.  Patient denies chest pain, syncope, near syncope.  Denies orthopnea, PND, leg edema.  Current Outpatient Medications on File Prior to Visit  Medication Sig Dispense Refill   atorvastatin (LIPITOR) 20 MG tablet Take 1 tablet (20 mg total) by mouth daily. 30 tablet 0   FLUoxetine (PROZAC) 10 MG capsule Take 10 mg by mouth daily.     hydrOXYzine (VISTARIL) 50 MG capsule Take 50 mg by mouth at bedtime.     ibuprofen (ADVIL) 200 MG tablet Take 1 tablet by mouth as needed.     metoprolol tartrate (LOPRESSOR) 25 MG tablet Take 1 tablet (25 mg total) by mouth 2 (two) times daily. (Patient taking differently: Take 50 mg by mouth 2 (two) times daily.) 60 tablet 0   naltrexone (DEPADE) 50 MG tablet Take 50 mg by mouth daily.     ondansetron (ZOFRAN) 4 MG tablet Take 1 tablet (4 mg total) by mouth every 6 (six) hours as needed for nausea. 20 tablet 0    QUEtiapine (SEROQUEL) 100 MG tablet Take 100 mg by mouth at bedtime.     senna-docusate (SENOKOT-S) 8.6-50 MG tablet Take 1 tablet by mouth 2 (two) times daily as needed for moderate constipation.     traZODone (DESYREL) 150 MG tablet Take 150 mg by mouth at bedtime.     methocarbamol (ROBAXIN) 500 MG tablet Take 500 mg by mouth as needed.     No current facility-administered medications on file prior to visit.    Cardiovascular & other pertient studies: EKG 04/05/2021: Sinus rhythm at a rate of 94 bpm.   CORONARY CALCIUM SCORES 03/01/2021: Left Main: 0 LAD: 35.8 LCx: 0 RCA: 0 Total Agatston Score: 35.8 [place]ES[place] database percentile: 31   AORTA MEASUREMENTS: Ascending Aorta: 35 mm Descending Aorta: 27 mm   OTHER FINDINGS: Heart is normal size. Aorta normal caliber. Scattered aortic calcifications in the aortic root and descending thoracic aorta. No adenopathy. Small hiatal hernia. No confluent opacities or effusions. Scarring in the lung bases. Imaging into the upper abdomen demonstrates no acute findings. Bilateral gynecomastia. No acute bony abnormality.   IMPRESSION: Total Agatston score: 35.8 [place] database percentile: 31 Aortic atherosclerosis. Small hiatal hernia. Bilateral gynecomastia.  EKG 02/08/2021:  Sinus rhythm at a rate of 55 bpm.  Normal axis.  No evidence of ischemia or underlying injury pattern.  EKG 08/16/2020: Sinus tachycardia 110 bpm Frequent PVCs RSR(V1) -nondiagnostic  Echocardiogram 12/2016: Mid LVH. LVEF 55%. Indeterminate diastolic filing Mild  CTA 12/2016: °No PE °Moderate paraesophageal hernia with stomach and portions of colon above the diaphragm. °3.5 x 3 mm nodular opacity right upper lobe. ° °Event monitor 11/2016: °ccasional PAC, no other arrhythmia ° °Exercise treadmill stress test 01/2014: °7.3 METS °No ischemic changes ° °Recent labs: °12/18/2020: °Hgb 15, HCT 44.5, MCV 89.6, platelet 170 °Sodium 144, potassium 4.0, BUN 10, creatinine 0.89,  GFR >60 ° °08/09/2020: °Glucose 101, BUN/Cr 13/1.17. EGFR 68. Na/K 137/3.6. Rest of the CMP normal °H/H 14/42. MCV 88. Platelets 143 °HbA1C NaA °Lipid panel N/A °TSH 3.8 normal °BNP 78 ° ° °Review of Systems  °Constitutional: Negative for malaise/fatigue and weight gain.  °Cardiovascular:  Positive for dyspnea on exertion (mildly improved) and palpitations (stable). Negative for chest pain, claudication, leg swelling, near-syncope, orthopnea, paroxysmal nocturnal dyspnea and syncope.  °Respiratory:  Negative for shortness of breath.   °Neurological:  Negative for dizziness.  ° °   ° ° °Vitals:  ° 04/05/21 1306  °BP: 97/66  °Pulse: (!) 106  °SpO2: 97%  ° ° ° °Body mass index is 28.87 kg/m². °Filed Weights  ° 04/05/21 1306  °Weight: 207 lb (93.9 kg)  ° ° ° °Objective:  ° Physical Exam °Vitals and nursing note reviewed.  °Constitutional:   °   General: He is not in acute distress. °Neck:  °   Vascular: No JVD.  °Cardiovascular:  °   Rate and Rhythm: Normal rate and regular rhythm.  °   Heart sounds: Normal heart sounds. No murmur heard. °Pulmonary:  °   Effort: Pulmonary effort is normal.  °   Breath sounds: Normal breath sounds. No wheezing or rales.  °Abdominal:  °   General: Abdomen is protuberant.  °Musculoskeletal:  °   Right lower leg: No edema.  °   Left lower leg: No edema.  °Physical exam unchanged compared to previous office visit.  ° °   °Assessment & Recommendations:  ° °70 y.o. Caucasian male with h/o alcohol abuse, hiatal hernia, originally referred for evaluation of tachycardia and concern for alcoholic cardiomyopathy. Following cardiac evaluation which was unremarkable patient was advised to follow-up as needed. ° °Patient was seen 02/08/2021 with complaints of exertional dyspnea.  On last visit ordered BNP which was normal and CMP revealed normal liver function with increased creatinine compared to 12/2020.  Patient was therefore advised to follow-up with PCP regarding creatinine.  At last office visit  also ordered coronary calcium scoring which noted total coronary calcium score of 35.8 (all in the LAD) which places patient in 31 percentile based on age and sex.  CT also noted aortic atherosclerosis, small hiatal hernia, and bilateral gynecomastia.  Recommend continued risk factor management.  Patient has upcoming appointment with PCP in February at which time he will have lipid profile testing done.  I requested that results be shared with our office. ° °Patient's blood pressure is soft at today's visit. This may also be contributing to patient's dyspnea on exertion, will therefore stop amlodipine at this time. Advised patient to monitor blood pressure closely. Will defer further management to patient's PCP as they have upcoming follow-up appointment. ° °Patient is otherwise stable from a cardiovascular standpoint.  Follow-up in 1 year, sooner if needed. ° ° ° C , PA-C °04/05/2021, 2:09 PM °Office: [phone] °

## 2021-04-20 DIAGNOSIS — Z23 Encounter for immunization: Secondary | ICD-10-CM | POA: Diagnosis not present

## 2021-05-02 DIAGNOSIS — N400 Enlarged prostate without lower urinary tract symptoms: Secondary | ICD-10-CM | POA: Diagnosis not present

## 2021-05-02 DIAGNOSIS — F331 Major depressive disorder, recurrent, moderate: Secondary | ICD-10-CM | POA: Diagnosis not present

## 2021-05-02 DIAGNOSIS — E7800 Pure hypercholesterolemia, unspecified: Secondary | ICD-10-CM | POA: Diagnosis not present

## 2021-05-02 DIAGNOSIS — K219 Gastro-esophageal reflux disease without esophagitis: Secondary | ICD-10-CM | POA: Diagnosis not present

## 2021-05-02 DIAGNOSIS — G4700 Insomnia, unspecified: Secondary | ICD-10-CM | POA: Diagnosis not present

## 2021-05-02 DIAGNOSIS — G8929 Other chronic pain: Secondary | ICD-10-CM | POA: Diagnosis not present

## 2021-05-02 DIAGNOSIS — I10 Essential (primary) hypertension: Secondary | ICD-10-CM | POA: Diagnosis not present

## 2021-06-17 DIAGNOSIS — Z20822 Contact with and (suspected) exposure to covid-19: Secondary | ICD-10-CM | POA: Diagnosis not present

## 2021-07-06 DIAGNOSIS — Z20828 Contact with and (suspected) exposure to other viral communicable diseases: Secondary | ICD-10-CM | POA: Diagnosis not present

## 2021-07-06 DIAGNOSIS — Z1152 Encounter for screening for COVID-19: Secondary | ICD-10-CM | POA: Diagnosis not present

## 2021-08-01 DIAGNOSIS — Z20822 Contact with and (suspected) exposure to covid-19: Secondary | ICD-10-CM | POA: Diagnosis not present

## 2021-08-06 DIAGNOSIS — Z20822 Contact with and (suspected) exposure to covid-19: Secondary | ICD-10-CM | POA: Diagnosis not present

## 2021-08-10 DIAGNOSIS — H2513 Age-related nuclear cataract, bilateral: Secondary | ICD-10-CM | POA: Diagnosis not present

## 2021-08-10 DIAGNOSIS — D3131 Benign neoplasm of right choroid: Secondary | ICD-10-CM | POA: Diagnosis not present

## 2021-08-12 DIAGNOSIS — Z20822 Contact with and (suspected) exposure to covid-19: Secondary | ICD-10-CM | POA: Diagnosis not present

## 2021-08-15 DIAGNOSIS — Z20822 Contact with and (suspected) exposure to covid-19: Secondary | ICD-10-CM | POA: Diagnosis not present

## 2021-08-16 DIAGNOSIS — R7303 Prediabetes: Secondary | ICD-10-CM | POA: Diagnosis not present

## 2021-08-16 DIAGNOSIS — D696 Thrombocytopenia, unspecified: Secondary | ICD-10-CM | POA: Diagnosis not present

## 2021-08-16 DIAGNOSIS — E7800 Pure hypercholesterolemia, unspecified: Secondary | ICD-10-CM | POA: Diagnosis not present

## 2021-08-17 DIAGNOSIS — Z20828 Contact with and (suspected) exposure to other viral communicable diseases: Secondary | ICD-10-CM | POA: Diagnosis not present

## 2021-08-18 DIAGNOSIS — N5201 Erectile dysfunction due to arterial insufficiency: Secondary | ICD-10-CM | POA: Diagnosis not present

## 2021-08-18 DIAGNOSIS — N3943 Post-void dribbling: Secondary | ICD-10-CM | POA: Diagnosis not present

## 2021-08-18 DIAGNOSIS — N401 Enlarged prostate with lower urinary tract symptoms: Secondary | ICD-10-CM | POA: Diagnosis not present

## 2021-08-23 ENCOUNTER — Other Ambulatory Visit: Payer: Self-pay | Admitting: Family Medicine

## 2021-08-23 DIAGNOSIS — M791 Myalgia, unspecified site: Secondary | ICD-10-CM | POA: Diagnosis not present

## 2021-08-23 DIAGNOSIS — R1900 Intra-abdominal and pelvic swelling, mass and lump, unspecified site: Secondary | ICD-10-CM | POA: Diagnosis not present

## 2021-09-19 ENCOUNTER — Ambulatory Visit
Admission: RE | Admit: 2021-09-19 | Discharge: 2021-09-19 | Disposition: A | Discharge status: Home / Self Care | Payer: Medicare Other | Source: Ambulatory Visit | Attending: Family Medicine | Admitting: Family Medicine

## 2021-09-19 DIAGNOSIS — R1900 Intra-abdominal and pelvic swelling, mass and lump, unspecified site: Secondary | ICD-10-CM

## 2021-09-19 DIAGNOSIS — N400 Enlarged prostate without lower urinary tract symptoms: Secondary | ICD-10-CM | POA: Diagnosis not present

## 2021-09-19 DIAGNOSIS — K449 Diaphragmatic hernia without obstruction or gangrene: Secondary | ICD-10-CM | POA: Diagnosis not present

## 2021-09-19 DIAGNOSIS — K429 Umbilical hernia without obstruction or gangrene: Secondary | ICD-10-CM | POA: Diagnosis not present

## 2021-09-19 DIAGNOSIS — K5730 Diverticulosis of large intestine without perforation or abscess without bleeding: Secondary | ICD-10-CM | POA: Diagnosis not present

## 2021-09-19 MED ORDER — IOPAMIDOL (ISOVUE-300) INJECTION 61%
100.0000 mL | Freq: Once | INTRAVENOUS | Status: AC | PRN
  Administered 2021-09-19: 100 mL via INTRAVENOUS

## 2021-09-27 ENCOUNTER — Other Ambulatory Visit: Payer: Self-pay | Admitting: Gastroenterology

## 2021-09-27 DIAGNOSIS — K8689 Other specified diseases of pancreas: Secondary | ICD-10-CM

## 2021-09-28 ENCOUNTER — Encounter (HOSPITAL_COMMUNITY): Payer: Self-pay

## 2021-09-28 ENCOUNTER — Emergency Department (HOSPITAL_COMMUNITY)
Admission: EM | Admit: 2021-09-28 | Discharge: 2021-09-28 | Disposition: A | Discharge status: Home / Self Care | Payer: Medicare Other | Attending: Emergency Medicine | Admitting: Emergency Medicine

## 2021-09-28 DIAGNOSIS — F10120 Alcohol abuse with intoxication, uncomplicated: Secondary | ICD-10-CM | POA: Diagnosis not present

## 2021-09-28 DIAGNOSIS — R0609 Other forms of dyspnea: Secondary | ICD-10-CM | POA: Diagnosis not present

## 2021-09-28 DIAGNOSIS — Z79899 Other long term (current) drug therapy: Secondary | ICD-10-CM | POA: Diagnosis not present

## 2021-09-28 DIAGNOSIS — Y908 Blood alcohol level of 240 mg/100 ml or more: Secondary | ICD-10-CM | POA: Diagnosis not present

## 2021-09-28 DIAGNOSIS — R109 Unspecified abdominal pain: Secondary | ICD-10-CM | POA: Diagnosis not present

## 2021-09-28 DIAGNOSIS — R519 Headache, unspecified: Secondary | ICD-10-CM | POA: Diagnosis not present

## 2021-09-28 DIAGNOSIS — R1110 Vomiting, unspecified: Secondary | ICD-10-CM | POA: Diagnosis present

## 2021-09-28 DIAGNOSIS — R000 Tachycardia, unspecified: Secondary | ICD-10-CM | POA: Insufficient documentation

## 2021-09-28 DIAGNOSIS — R0600 Dyspnea, unspecified: Secondary | ICD-10-CM | POA: Diagnosis not present

## 2021-09-28 DIAGNOSIS — F10920 Alcohol use, unspecified with intoxication, uncomplicated: Secondary | ICD-10-CM

## 2021-09-28 LAB — COMPREHENSIVE METABOLIC PANEL WITH GFR
ALT: 24 U/L (ref 0–44)
AST: 30 U/L (ref 15–41)
Albumin: 4.1 g/dL (ref 3.5–5.0)
Alkaline Phosphatase: 77 U/L (ref 38–126)
Anion gap: 16 — ABNORMAL HIGH (ref 5–15)
BUN: 23 mg/dL (ref 8–23)
CO2: 18 mmol/L — ABNORMAL LOW (ref 22–32)
Calcium: 8.7 mg/dL — ABNORMAL LOW (ref 8.9–10.3)
Chloride: 107 mmol/L (ref 98–111)
Creatinine, Ser: 1.19 mg/dL (ref 0.61–1.24)
GFR, Estimated: >60 mL/min
Glucose, Bld: 110 mg/dL — ABNORMAL HIGH (ref 70–99)
Potassium: 3.8 mmol/L (ref 3.5–5.1)
Sodium: 141 mmol/L (ref 135–145)
Total Bilirubin: 0.9 mg/dL (ref 0.3–1.2)
Total Protein: 7.6 g/dL (ref 6.5–8.1)

## 2021-09-28 LAB — CBC WITH DIFFERENTIAL/PLATELET
Abs Immature Granulocytes: 0.02 10*3/uL (ref 0.00–0.07)
Basophils Absolute: 0.1 10*3/uL (ref 0.0–0.1)
Basophils Relative: 1 %
Eosinophils Absolute: 0.1 10*3/uL (ref 0.0–0.5)
Eosinophils Relative: 2 %
HCT: 48.4 % (ref 39.0–52.0)
Hemoglobin: 16.7 g/dL (ref 13.0–17.0)
Immature Granulocytes: 0 %
Lymphocytes Relative: 30 %
Lymphs Abs: 2 10*3/uL (ref 0.7–4.0)
MCH: 27.6 pg (ref 26.0–34.0)
MCHC: 34.5 g/dL (ref 30.0–36.0)
MCV: 79.9 fL — ABNORMAL LOW (ref 80.0–100.0)
Monocytes Absolute: 0.6 10*3/uL (ref 0.1–1.0)
Monocytes Relative: 8 %
Neutro Abs: 3.9 10*3/uL (ref 1.7–7.7)
Neutrophils Relative %: 59 %
Platelets: 201 10*3/uL (ref 150–400)
RBC: 6.06 MIL/uL — ABNORMAL HIGH (ref 4.22–5.81)
RDW: 13.2 % (ref 11.5–15.5)
WBC: 6.6 10*3/uL (ref 4.0–10.5)
nRBC: 0 % (ref 0.0–0.2)

## 2021-09-28 LAB — ETHANOL: Alcohol, Ethyl (B): 250 mg/dL — ABNORMAL HIGH (ref <10)

## 2021-09-28 LAB — TSH: TSH: 3.136 u[IU]/mL (ref 0.350–4.500)

## 2021-09-28 LAB — MAGNESIUM: Magnesium: 2.4 mg/dL (ref 1.7–2.4)

## 2021-09-28 LAB — LIPASE, BLOOD: Lipase: 30 U/L (ref 11–51)

## 2021-09-28 MED ORDER — METOPROLOL TARTRATE 25 MG PO TABS
50.0000 mg | ORAL_TABLET | Freq: Once | ORAL | Status: AC
Start: 2021-09-28 — End: 2021-09-28
  Administered 2021-09-28: 50 mg via ORAL
  Filled 2021-09-28: qty 2

## 2021-09-28 MED ORDER — LACTATED RINGERS IV BOLUS
1000.0000 mL | Freq: Once | INTRAVENOUS | Status: AC
  Administered 2021-09-28: 1000 mL via INTRAVENOUS

## 2021-09-28 MED ORDER — LACTATED RINGERS IV BOLUS: 1000.0000 mL | Freq: Once | INTRAVENOUS | Status: DC

## 2021-09-28 MED ORDER — LACTATED RINGERS IV SOLN
INTRAVENOUS | Status: DC
Start: 2021-09-28 — End: 2021-09-28

## 2021-09-28 NOTE — ED Triage Notes (Signed)
Pt arrives via [facility] from home originally c/o abdominal pain. Pt states that he was eight months sober, but then he drank 1/5 of liquor today and began having L sided abdominal pain and nausea. Pt denies pain currently. States he has a hx of seizures when withdrawing from alcohol.

## 2021-09-28 NOTE — ED Notes (Signed)
Pt states understanding of dc instructions, importance of follow up. Pt denies questions or concerns upon dc. Pt assisted into wheelchair and wheeled out to ed lobby to await ride. No belongings left in room upon dc.  

## 2021-09-28 NOTE — Discharge Instructions (Signed)
Alcohol level today is .25.  Avoid any further alcohol.  You did receive your metoprolol here so you don't need to take that.  If you haven't had your other medications you can take them when you get home.

## 2021-09-28 NOTE — ED Provider Notes (Signed)
[facility] DEPT Provider Note   CSN: [id number] Arrival date & time: 09/28/21  1406     History  Chief Complaint  Patient presents with   Alcohol Intoxication    Ruben [name] is a 71 y.o. male.  Pt is a 71y/o male with h/o alcohol abuse had been sober for 8 months and relapsed 6 days ago, hiatal hernia, with persistent tachycardia without acute cardiac disease presenting today from home reporting that he needs detox.  Patient reports he has been drinking over 1/5 of liquor daily since Friday when he fell off the wagon.  He does admit earlier today he was having abdominal pain and some vomiting but he drank alcohol and now feels better.  He last had something to drink about 10 AM.  He reports for some time he has had dyspnea on exertion but has followed up with cardiology and they cannot find the cause of his symptoms.  He has not noticed any fever and denies any chest pain.  He has had no lower extremity swelling.  He denies other substance use.  The history is provided by the patient and medical records.  Alcohol Intoxication       Home Medications Prior to Admission medications   Medication Sig Start Date End Date Taking? Authorizing Provider  amLODipine (NORVASC) 5 MG tablet Take 5 mg by mouth daily. 08/30/21  Yes [provider]  hydrOXYzine (ATARAX) 50 MG tablet Take 50 mg by mouth at bedtime as needed for anxiety. 09/07/21  Yes [provider]  ibuprofen (ADVIL) 200 MG tablet Take 200 mg by mouth daily as needed for moderate pain.   Yes [provider]  magnesium hydroxide (MILK OF MAGNESIA) 400 MG/5ML suspension Take 30 mLs by mouth every other day.   Yes [provider]  methocarbamol (ROBAXIN) 500 MG tablet Take 500 mg by mouth daily as needed for muscle spasms. 03/10/21  Yes [provider]  metoprolol tartrate (LOPRESSOR) 50 MG tablet Take 50 mg by mouth 2 (two) times daily.   Yes [provider]  QUEtiapine (SEROQUEL) 100 MG tablet Take 100 mg by mouth at bedtime. 01/27/21  Yes [provider]  rosuvastatin (CRESTOR) 20 MG tablet Take 20 mg by mouth every Monday, Wednesday, and Friday. 08/23/21  Yes [provider]  atorvastatin (LIPITOR) 20 MG tablet Take 1 tablet (20 mg total) by mouth daily. Patient not taking: Reported on 09/28/2021 10/06/19 04/05/21  Alma Friendly, MD  metoprolol tartrate (LOPRESSOR) 25 MG tablet Take 1 tablet (25 mg total) by mouth 2 (two) times daily. Patient not taking: Reported on 09/28/2021 09/14/20 04/05/21  Raiford Noble Latif, DO  ondansetron (ZOFRAN) 4 MG tablet Take 1 tablet (4 mg total) by mouth every 6 (six) hours as needed for nausea. Patient not taking: Reported on 09/28/2021 09/14/20   Raiford Noble Latif, DO  senna-docusate (SENOKOT-S) 8.6-50 MG tablet Take 1 tablet by mouth 2 (two) times daily as needed for moderate constipation. Patient not taking: Reported on 09/28/2021 12/30/19   Mercy Riding, MD      Allergies    Patient has no known allergies.    Review of Systems   Review of Systems  Physical Exam Updated Vital Signs BP (!) 183/127   Pulse (!) 120   Temp 98.4 F (36.9 C) (Oral)   Resp 20   SpO2 97%  Physical Exam Vitals and nursing note reviewed.  Constitutional:      General: He  is not in acute distress.    Appearance: He is well-developed.  HENT:     Head: Normocephalic and atraumatic.     Mouth/Throat:     Mouth: Mucous membranes are moist.  Eyes:     Conjunctiva/sclera: Conjunctivae normal.     Pupils: Pupils are equal, round, and reactive to light.  Cardiovascular:     Rate and Rhythm: Regular rhythm. Tachycardia present.     Pulses: Normal pulses.     Heart sounds: No murmur heard. Pulmonary:     Effort: Pulmonary effort is normal. No respiratory distress.     Breath sounds: Normal breath sounds. No wheezing or rales.  Abdominal:     General: There is no distension.      Palpations: Abdomen is soft.     Tenderness: There is abdominal tenderness in the left upper quadrant. There is no guarding or rebound.  Musculoskeletal:        General: No tenderness. Normal range of motion.     Cervical back: Normal range of motion and neck supple.     Right lower leg: No edema.     Left lower leg: No edema.  Skin:    General: Skin is warm and dry.     Findings: No erythema or rash.  Neurological:     Mental Status: He is alert and oriented to person, place, and time. Mental status is at baseline.  Psychiatric:        Behavior: Behavior normal.     Comments: cooperative     ED Results / Procedures / Treatments   Labs (all labs ordered are listed, but only abnormal results are displayed) Labs Reviewed  CBC WITH DIFFERENTIAL/PLATELET - Abnormal; Notable for the following components:      Result Value   RBC 6.06 (*)    MCV 79.9 (*)    All other components within normal limits  COMPREHENSIVE METABOLIC PANEL - Abnormal; Notable for the following components:   CO2 18 (*)    Glucose, Bld 110 (*)    Calcium 8.7 (*)    Anion gap 16 (*)    All other components within normal limits  ETHANOL - Abnormal; Notable for the following components:   Alcohol, Ethyl (B) 250 (*)    All other components within normal limits  LIPASE, BLOOD  MAGNESIUM  TSH    EKG EKG Interpretation  Date/Time:  Wednesday September 28 2021 15:00:56 EDT Ventricular Rate:  116 PR Interval:  148 QRS Duration: 87 QT Interval:  326 QTC Calculation: 453 R Axis:   -23 Text Interpretation: Sinus tachycardia Borderline left axis deviation Borderline repolarization abnormality Baseline wander in lead(s) V2 Confirmed by Blanchie Dessert [phone]) on 09/28/2021 4:04:48 PM  Radiology No results found.  Procedures Procedures    Medications Ordered in ED Medications  lactated ringers bolus 1,000 mL (has no administration in time range)  lactated ringers infusion (has no administration in time range)   lactated ringers bolus 1,000 mL (0 mLs Intravenous Stopped 09/28/21 1630)  metoprolol tartrate (LOPRESSOR) tablet 50 mg (50 mg Oral Given 09/28/21 1629)    ED Course/ Medical Decision Making/ A&P                           Medical Decision Making Amount and/or Complexity of Data Reviewed External Data Reviewed: notes.    Details: prior cards and hospitalization Labs: ordered. Decision-making details documented in ED Course. ECG/medicine tests: ordered and independent interpretation performed.  Decision-making details documented in ED Course.   Pt with multiple medical problems and comorbidities and presenting today with a complaint that caries a high risk for morbidity and mortality.  Presenting today requesting rehab however patient has been drinking heavily for the last 6 days has had abdominal pain and vomiting is noted to be tachycardic here.  Has not been eating much.  Concern for possible electrolyte abnormality versus dehydration versus pancreatitis.  Patient does have some mild tenderness on the left side of his abdomen.  Patient also has a history of tachycardia of unknown cause but did not show significant evidence of coronary artery disease when he saw cardiology in December.  He denies any chest pain at this time but is noted to have heart rate in the 130s.  Patient's EKG which I independently interpreted shows sinus tachycardia without dysrhythmia.  Also will check a TSH as one has not been in our system for over a year and may be an explanation of patient's unknown tachycardia.  Patient given IV fluids.  Labs are pending.  4:36 PM I independently interpreted patient's labs and CBC today within normal limits, CMP with normal renal function and electrolytes including potassium and magnesium, lipase within normal limits and EtOH elevated at 250.  Is within normal limits.  Also when looking through patient's medications he supposed to take 50 mg of metoprolol twice daily which he has not  had today and may have reflex tachycardia from that.  Tachycardia did improve after 1 L of fluid and is now 110.  We will give a dose of his metoprolol.  No evidence to suggest withdrawal at this time.  Patient reports he has been talking to a lady by the name of Verdis Frederickson from [facility] in [place] and he wanted me to contact them to let them know his lab results.  I spoke with the gentleman on the phone and let him know the patient's alcohol level.  He reports they will call the patient back shortly and do have a spot for him.  He does not need to remain in the emergency room for this.  At this time he appears medically clear and does not show signs of withdrawal or need for admission.  He is tolerating p.o.'s at this time.  Patient reports he can get a ride home.  He is able to ambulate without difficulty.         Final Clinical Impression(s) / ED Diagnoses Final diagnoses:  Alcoholic intoxication without complication [facility])    Rx / DC Orders ED Discharge Orders     None         Blanchie Dessert, MD 09/28/21 1636

## 2021-09-29 DIAGNOSIS — R410 Disorientation, unspecified: Secondary | ICD-10-CM | POA: Diagnosis not present

## 2021-09-29 DIAGNOSIS — R519 Headache, unspecified: Secondary | ICD-10-CM | POA: Diagnosis not present

## 2021-09-29 DIAGNOSIS — R55 Syncope and collapse: Secondary | ICD-10-CM | POA: Diagnosis not present

## 2021-09-29 DIAGNOSIS — W19XXXA Unspecified fall, initial encounter: Secondary | ICD-10-CM | POA: Diagnosis not present

## 2021-09-29 DIAGNOSIS — R0600 Dyspnea, unspecified: Secondary | ICD-10-CM | POA: Diagnosis not present

## 2021-09-29 DIAGNOSIS — S0990XA Unspecified injury of head, initial encounter: Secondary | ICD-10-CM | POA: Diagnosis not present

## 2021-10-22 ENCOUNTER — Ambulatory Visit
Admission: RE | Admit: 2021-10-22 | Discharge: 2021-10-22 | Disposition: A | Discharge status: Home / Self Care | Payer: Medicare Other | Source: Ambulatory Visit | Attending: Gastroenterology | Admitting: Gastroenterology

## 2021-10-22 DIAGNOSIS — K8689 Other specified diseases of pancreas: Secondary | ICD-10-CM

## 2021-10-22 DIAGNOSIS — K760 Fatty (change of) liver, not elsewhere classified: Secondary | ICD-10-CM | POA: Diagnosis not present

## 2021-10-22 MED ORDER — GADOBENATE DIMEGLUMINE 529 MG/ML IV SOLN
20.0000 mL | Freq: Once | INTRAVENOUS | Status: AC | PRN
  Administered 2021-10-22: 20 mL via INTRAVENOUS

## 2021-11-02 DIAGNOSIS — Z86010 Personal history of colonic polyps: Secondary | ICD-10-CM | POA: Diagnosis not present

## 2021-11-02 DIAGNOSIS — K5900 Constipation, unspecified: Secondary | ICD-10-CM | POA: Diagnosis not present

## 2021-11-02 DIAGNOSIS — R140 Abdominal distension (gaseous): Secondary | ICD-10-CM | POA: Diagnosis not present

## 2021-12-07 DIAGNOSIS — E7800 Pure hypercholesterolemia, unspecified: Secondary | ICD-10-CM | POA: Diagnosis not present

## 2021-12-07 DIAGNOSIS — R7303 Prediabetes: Secondary | ICD-10-CM | POA: Diagnosis not present

## 2021-12-07 DIAGNOSIS — I10 Essential (primary) hypertension: Secondary | ICD-10-CM | POA: Diagnosis not present

## 2021-12-07 DIAGNOSIS — K219 Gastro-esophageal reflux disease without esophagitis: Secondary | ICD-10-CM | POA: Diagnosis not present

## 2022-01-10 DIAGNOSIS — Z86010 Personal history of colonic polyps: Secondary | ICD-10-CM | POA: Diagnosis not present

## 2022-01-10 DIAGNOSIS — D120 Benign neoplasm of cecum: Secondary | ICD-10-CM | POA: Diagnosis not present

## 2022-01-10 DIAGNOSIS — Z09 Encounter for follow-up examination after completed treatment for conditions other than malignant neoplasm: Secondary | ICD-10-CM | POA: Diagnosis not present

## 2022-01-12 DIAGNOSIS — D120 Benign neoplasm of cecum: Secondary | ICD-10-CM | POA: Diagnosis not present

## 2022-01-18 DIAGNOSIS — R7303 Prediabetes: Secondary | ICD-10-CM | POA: Diagnosis not present

## 2022-01-18 DIAGNOSIS — I10 Essential (primary) hypertension: Secondary | ICD-10-CM | POA: Diagnosis not present

## 2022-02-03 DIAGNOSIS — R3914 Feeling of incomplete bladder emptying: Secondary | ICD-10-CM | POA: Diagnosis not present

## 2022-02-03 DIAGNOSIS — N401 Enlarged prostate with lower urinary tract symptoms: Secondary | ICD-10-CM | POA: Diagnosis not present

## 2022-02-16 DIAGNOSIS — R3914 Feeling of incomplete bladder emptying: Secondary | ICD-10-CM | POA: Diagnosis not present

## 2022-02-16 DIAGNOSIS — N401 Enlarged prostate with lower urinary tract symptoms: Secondary | ICD-10-CM | POA: Diagnosis not present

## 2022-03-16 DIAGNOSIS — Z23 Encounter for immunization: Secondary | ICD-10-CM | POA: Diagnosis not present

## 2022-04-05 ENCOUNTER — Ambulatory Visit: Payer: PRIVATE HEALTH INSURANCE | Admitting: Cardiology

## 2022-04-13 ENCOUNTER — Ambulatory Visit: Payer: Medicare HMO | Admitting: Cardiology

## 2022-04-13 ENCOUNTER — Ambulatory Visit: Payer: Medicare Other | Admitting: Cardiology

## 2022-04-13 ENCOUNTER — Other Ambulatory Visit: Payer: Medicare HMO

## 2022-04-13 ENCOUNTER — Encounter: Payer: Self-pay | Admitting: Cardiology

## 2022-04-13 VITALS — BP 114/73 | HR 72 | Ht 71.0 in | Wt 225.2 lb

## 2022-04-13 DIAGNOSIS — R002 Palpitations: Secondary | ICD-10-CM

## 2022-04-13 DIAGNOSIS — R0609 Other forms of dyspnea: Secondary | ICD-10-CM | POA: Diagnosis not present

## 2022-04-13 DIAGNOSIS — I700 Atherosclerosis of aorta: Secondary | ICD-10-CM | POA: Diagnosis not present

## 2022-04-13 NOTE — Progress Notes (Signed)
Follow up visit  Subjective:   Ruben [name], male    DOB: [birth year], 72 y.o.   MRN: [id number]   HPI   Chief Complaint  Patient presents with   aortic atherosclerosis   Follow-up    72 y.o. Caucasian male with aortic atherosclerosis, alcohol abuse, exertional dyspnea  Patient has continued to have exertional dyspnea, particularly with walking up the hill. He denies chest pain. Fortunately, he has quit alcohol since 09/2021. He has gained several pounds of weight. He also reprots intermittent episodes of palpitations.    Current Outpatient Medications:    cloNIDine (CATAPRES) 0.1 MG tablet, Take 0.1 mg by mouth 2 (two) times daily., Disp: , Rfl:    escitalopram (LEXAPRO) 20 MG tablet, Take 20 mg by mouth daily., Disp: , Rfl:    finasteride (PROSCAR) 5 MG tablet, Take 5 mg by mouth daily., Disp: , Rfl:    gabapentin (NEURONTIN) 300 MG capsule, Take 300 mg by mouth 3 (three) times daily., Disp: , Rfl:    hydrOXYzine (ATARAX) 50 MG tablet, Take 50 mg by mouth at bedtime as needed for anxiety., Disp: , Rfl:    ibuprofen (ADVIL) 200 MG tablet, Take 200 mg by mouth daily as needed for moderate pain., Disp: , Rfl:    magnesium hydroxide (MILK OF MAGNESIA) 400 MG/5ML suspension, Take 30 mLs by mouth every other day., Disp: , Rfl:    methocarbamol (ROBAXIN) 500 MG tablet, Take 500 mg by mouth daily as needed for muscle spasms., Disp: , Rfl:    metoprolol tartrate (LOPRESSOR) 50 MG tablet, Take 50 mg by mouth 2 (two) times daily., Disp: , Rfl:    ondansetron (ZOFRAN) 4 MG tablet, Take 1 tablet (4 mg total) by mouth every 6 (six) hours as needed for nausea., Disp: 20 tablet, Rfl: 0   QUEtiapine Fumarate (SEROQUEL XR) 150 MG 24 hr tablet, Take 150 mg by mouth at bedtime., Disp: , Rfl:    rosuvastatin (CRESTOR) 20 MG tablet, Take 20 mg by mouth every Monday, Wednesday, and Friday., Disp: , Rfl:    senna-docusate (SENOKOT-S) 8.6-50 MG tablet, Take 1 tablet by mouth 2 (two) times daily as needed  for moderate constipation., Disp: , Rfl:    tamsulosin (FLOMAX) 0.4 MG CAPS capsule, Take 0.4 mg by mouth., Disp: , Rfl:    atorvastatin (LIPITOR) 20 MG tablet, Take 1 tablet (20 mg total) by mouth daily. (Patient not taking: Reported on 09/28/2021), Disp: 30 tablet, Rfl: 0   Cardiovascular & other pertient studies:  Echocardiogram 08/26/2020: Left ventricle cavity is normal in size and wall thickness. Normal global wall motion. Normal LV systolic function with EF 57%. Doppler evidence of grade I (impaired) diastolic dysfunction, normal LAP. No significant valvular abnormality. Trace pericardial effusion. Unable to evaluate PA pressure due to absence of TR and poor visualization of IVC. No significant change compared to previous study in 2018.  EKG 08/16/2020: Sinus tachycardia 110 bpm Frequent PVCs RSR(V1) -nondiagnostic  Event monitor 11/2016: ccasional PAC, no other arrhythmia  Exercise treadmill stress test 01/2014: 7.3 METS No ischemic changes  Recent labs: 09/28/2021: Glucose 110, BUN/Cr 23/1.19. EGFR >60. Na/K 141/3.8. Rest of the CMP normal H/H 16/48. MCV 79. Platelets 201 HbA1C NA Lipid panel NA TSH 3.1 normal  08/09/2020: Glucose 101, BUN/Cr 13/1.17. EGFR 68. Na/K 137/3.6. Rest of the CMP normal H/H 14/42. MCV 88. Platelets 143 HbA1C NaA Lipid panel N/A TSH 3.8 normal BNP 78   Review of Systems  Cardiovascular:  Positive for  dyspnea on exertion and palpitations. Negative for chest pain, leg swelling and syncope.         Vitals:   04/13/22 1041  BP: 114/73  Pulse: 72  SpO2: 94%     Body mass index is 31.41 kg/m. Filed Weights   04/13/22 1041  Weight: 225 lb 3.2 oz (102.2 kg)     Objective:   Physical Exam Vitals and nursing note reviewed.  Constitutional:      General: He is not in acute distress. Neck:     Vascular: No JVD.  Cardiovascular:     Rate and Rhythm: Regular rhythm. Tachycardia present.     Heart sounds: Normal heart sounds.  No murmur heard. Pulmonary:     Effort: Pulmonary effort is normal.     Breath sounds: Normal breath sounds. No wheezing or rales.  Abdominal:     General: Abdomen is protuberant.  Musculoskeletal:     Left lower leg: No edema.           Assessment & Recommendations:   72 y.o. Caucasian male with aortic atherosclerosis, alcohol abuse, exertional dyspnea  Exertional dyspnea: Prior cardiac workup a few years ago had been unremarkable. With aortic atherosclerosis, elevated calcium score, check exercise nuclear stress test. Also recommend echocardiogram.  Palpitations: Left atrial enlargement on EKG. Check cardiac telemetry.   Aortic atherosclerosis, elevated calcium score: Continue statin    [name], MD Pager: [phone] Office: [phone]

## 2022-04-21 IMAGING — CT CT HEAD W/O CM
3 series · 15 of 47 positions shown, 18 images · non-contrast
Comparison: CT head 10/01/2019

CLINICAL DATA: Headache, classic migraine features

EXAM:
CT HEAD WITHOUT CONTRAST
TECHNIQUE: Contiguous axial images were obtained from the base of the skull
through the vertex without intravenous contrast.

[Series 2: head wo · axial · 0.41mm/px · z∈[+1519,+1654]mm · 9 of 33 slices shown, 12 images]
[im 3/33  brain]
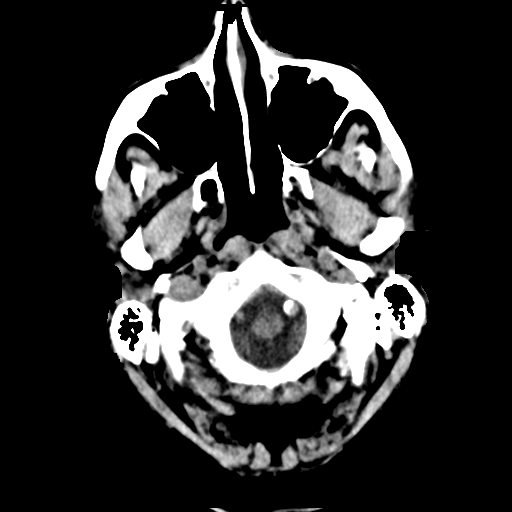
[im 3/33  bone]
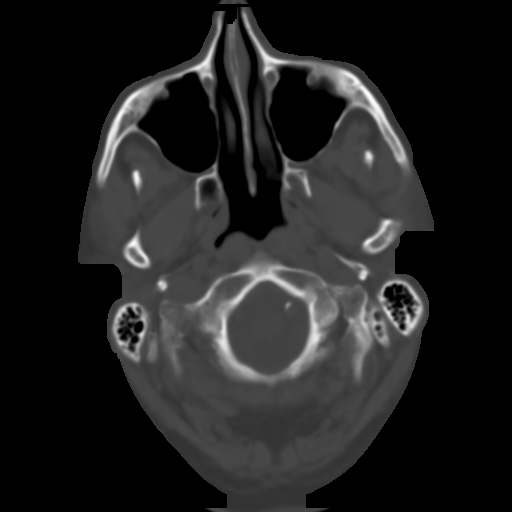
[im 6/33  brain]
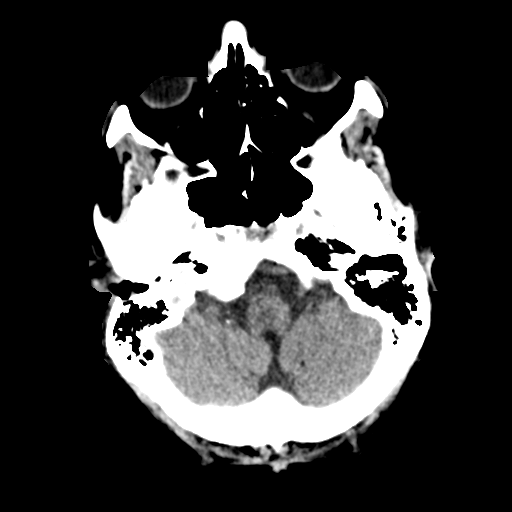
[im 9/33  brain]
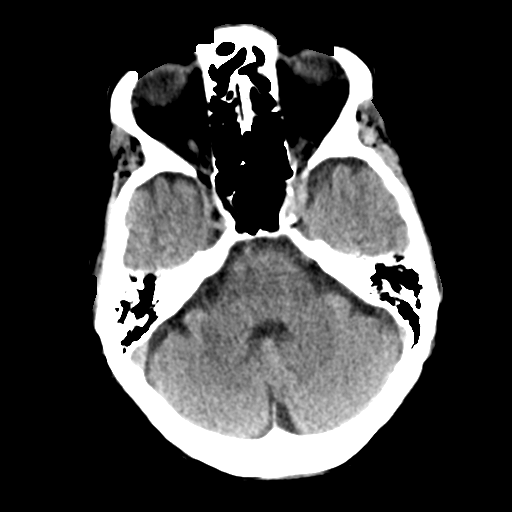
[im 13/33  brain]
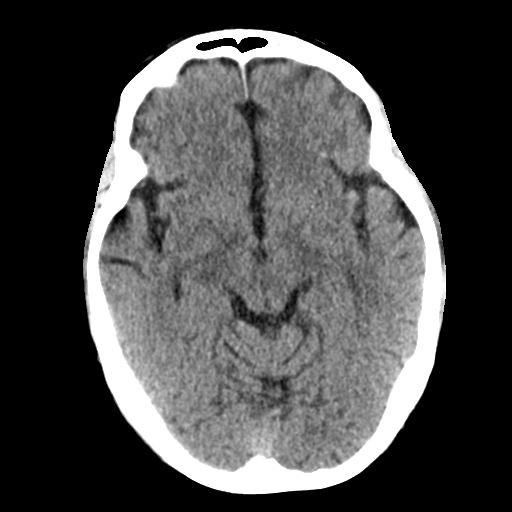
[im 17/33  brain]
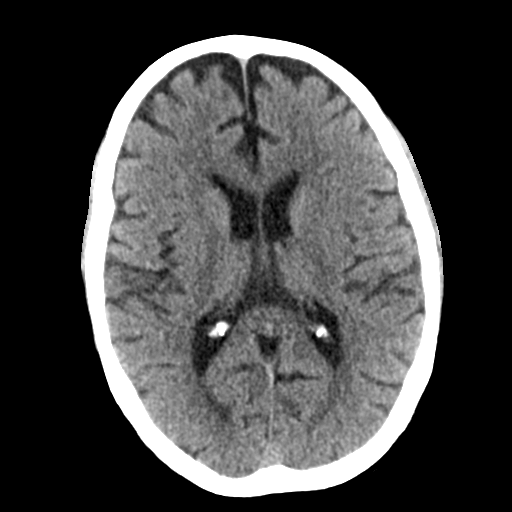
[im 17/33  bone]
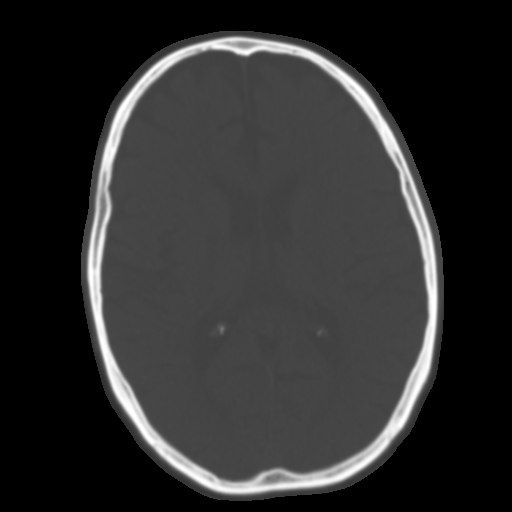
[im 20/33  brain]
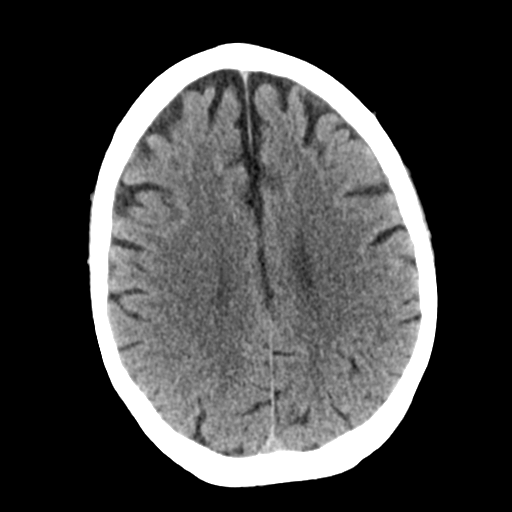
[im 24/33  brain]
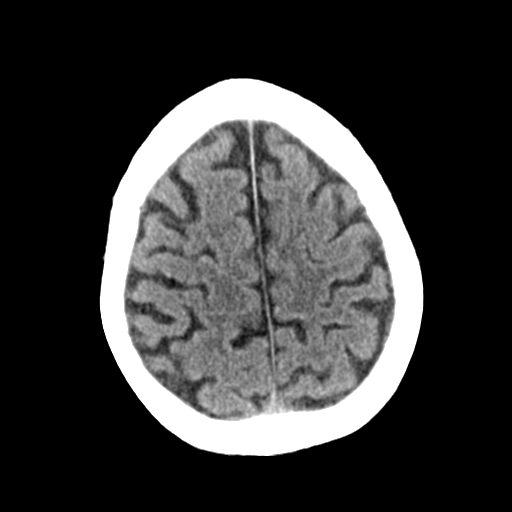
[im 27/33  brain]
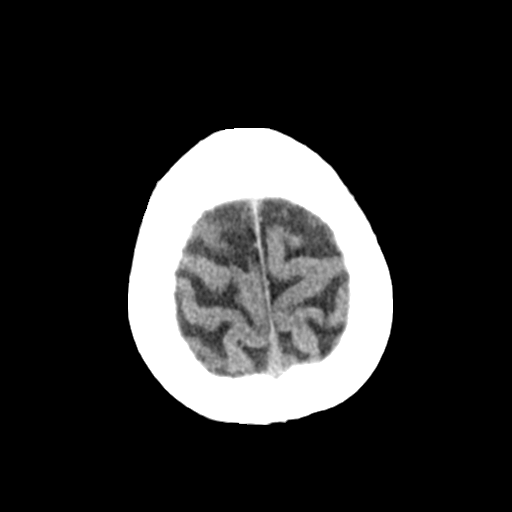
[im 30/33  brain]
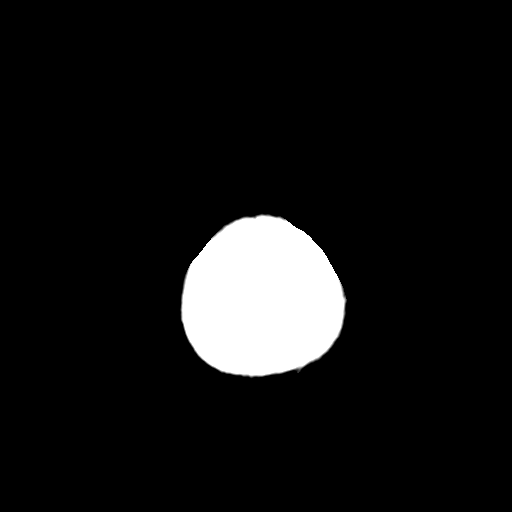
[im 30/33  bone]
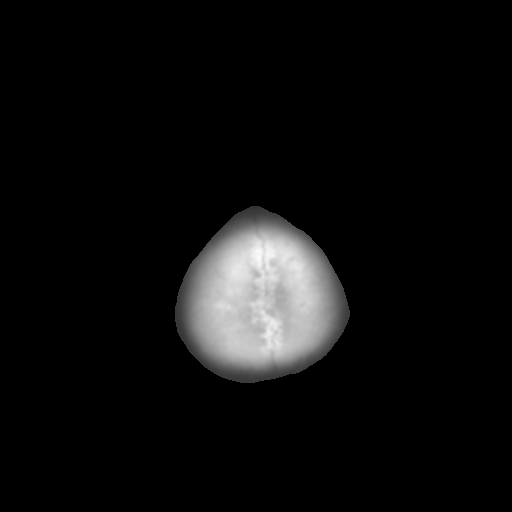

[Series 4: coronal soft tissue · coronal · 0.31mm/px · 3 of 67 slices shown]
[im 23/67  brain]
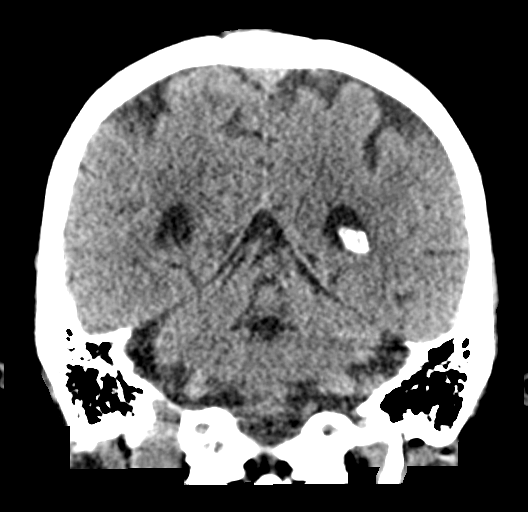
[im 30/67  brain]
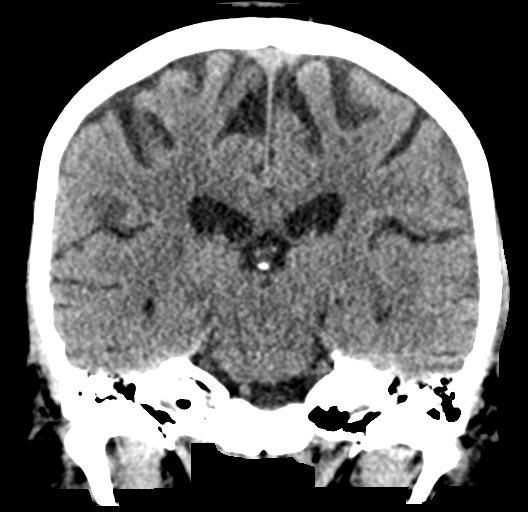
[im 37/67  brain]
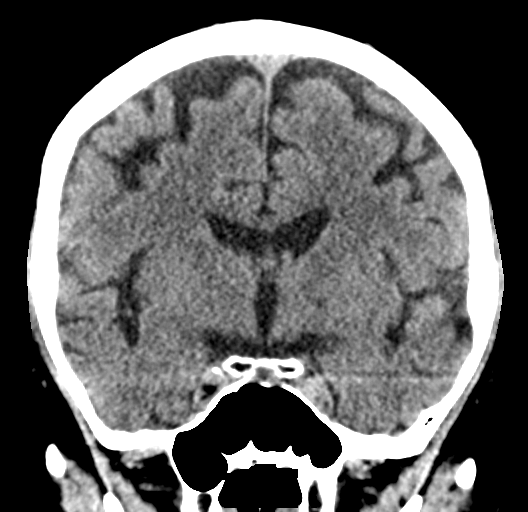

[Series 5: sagittal soft tissue · sagittal · 0.31mm/px · 3 of 56 slices shown]
[im 19/56  brain]
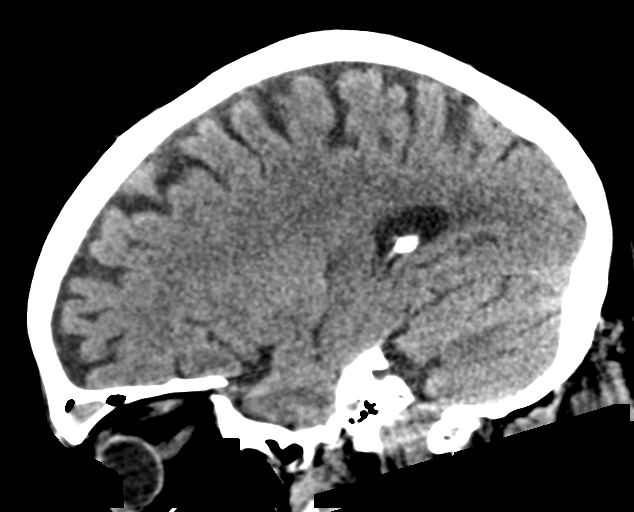
[im 28/56  brain]
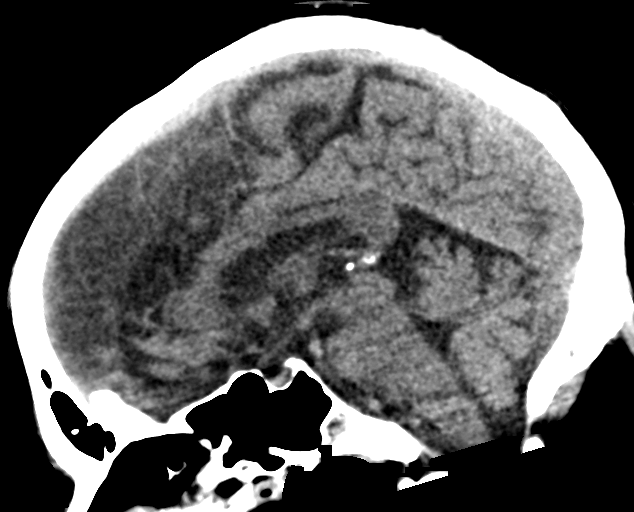
[im 37/56  brain]
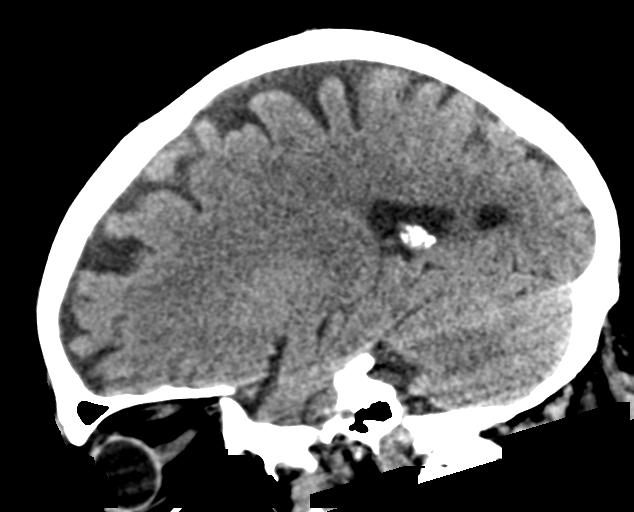

[15 of 47 positions shown; findings below may reference images not displayed]

FINDINGS: Brain: No evidence of acute infarction, hemorrhage, hydrocephalus,
extra-axial collection, visible mass lesion or mass effect.
Symmetric prominence of the ventricles, cisterns and sulci
compatible with parenchymal volume loss. Patchy areas of white
matter hypoattenuation are most compatible with chronic
microvascular angiopathy.

Vascular: Atherosclerotic calcification of the carotid siphons and
intradural vertebral arteries. No hyperdense vessel.

Skull: No calvarial fracture or suspicious osseous lesion. No scalp
swelling or hematoma. Few benign dermal calcifications. Chronic
right high parietal scalp infiltration/scarring.

Sinuses/Orbits: Nodular mural thickening in the right frontal sinus
and ethmoid air cells. Remaining paranasal sinuses and mastoid air
cells are clear without pneumatized secretions or air-fluid levels.
Middle ear cavities are clear. Debris noted in the bilateral
external auditory canals. Included orbital structures are
unremarkable.

Other: None
IMPRESSION: 1. No acute intracranial findings.
2. Mild parenchymal volume loss and chronic microvascular
angiopathy.
3. Intracranial atherosclerosis.
4. Debris in the bilateral external auditory canals, correlate for
cerumen impaction.

## 2022-04-25 ENCOUNTER — Ambulatory Visit: Payer: Medicare HMO

## 2022-04-25 DIAGNOSIS — R0609 Other forms of dyspnea: Secondary | ICD-10-CM

## 2022-04-25 DIAGNOSIS — I700 Atherosclerosis of aorta: Secondary | ICD-10-CM | POA: Diagnosis not present

## 2022-04-25 LAB — PCV MYOCARDIAL PERFUSION WO LEXISCAN
Angina Index: 0
ST Depression (mm): 0 mm

## 2022-04-27 ENCOUNTER — Ambulatory Visit: Payer: Medicare HMO

## 2022-04-27 DIAGNOSIS — R0609 Other forms of dyspnea: Secondary | ICD-10-CM | POA: Diagnosis not present

## 2022-04-27 DIAGNOSIS — I10 Essential (primary) hypertension: Secondary | ICD-10-CM | POA: Diagnosis not present

## 2022-04-27 DIAGNOSIS — R002 Palpitations: Secondary | ICD-10-CM | POA: Diagnosis not present

## 2022-05-02 DIAGNOSIS — Z6831 Body mass index (BMI) 31.0-31.9, adult: Secondary | ICD-10-CM | POA: Diagnosis not present

## 2022-05-02 DIAGNOSIS — I10 Essential (primary) hypertension: Secondary | ICD-10-CM | POA: Diagnosis not present

## 2022-05-02 DIAGNOSIS — F5101 Primary insomnia: Secondary | ICD-10-CM | POA: Diagnosis not present

## 2022-05-02 DIAGNOSIS — R7303 Prediabetes: Secondary | ICD-10-CM | POA: Diagnosis not present

## 2022-05-02 DIAGNOSIS — F3341 Major depressive disorder, recurrent, in partial remission: Secondary | ICD-10-CM | POA: Diagnosis not present

## 2022-05-02 DIAGNOSIS — F419 Anxiety disorder, unspecified: Secondary | ICD-10-CM | POA: Diagnosis not present

## 2022-05-02 DIAGNOSIS — R1310 Dysphagia, unspecified: Secondary | ICD-10-CM | POA: Diagnosis not present

## 2022-05-05 DIAGNOSIS — F3341 Major depressive disorder, recurrent, in partial remission: Secondary | ICD-10-CM | POA: Diagnosis not present

## 2022-05-05 DIAGNOSIS — I10 Essential (primary) hypertension: Secondary | ICD-10-CM | POA: Diagnosis not present

## 2022-05-05 DIAGNOSIS — G8929 Other chronic pain: Secondary | ICD-10-CM | POA: Diagnosis not present

## 2022-05-05 DIAGNOSIS — N400 Enlarged prostate without lower urinary tract symptoms: Secondary | ICD-10-CM | POA: Diagnosis not present

## 2022-05-05 DIAGNOSIS — E782 Mixed hyperlipidemia: Secondary | ICD-10-CM | POA: Diagnosis not present

## 2022-05-05 DIAGNOSIS — K219 Gastro-esophageal reflux disease without esophagitis: Secondary | ICD-10-CM | POA: Diagnosis not present

## 2022-05-09 DIAGNOSIS — N401 Enlarged prostate with lower urinary tract symptoms: Secondary | ICD-10-CM | POA: Diagnosis not present

## 2022-05-09 NOTE — Progress Notes (Signed)
Follow up visit  Subjective:   Ruben [name], male    DOB: 10/24/1950, 72 y.o.   MRN: [id number]   HPI   No chief complaint on file.   72 y.o. Caucasian male with aortic atherosclerosis, alcohol abuse, exertional dyspnea  Patient has continued to have exertional dyspnea, particularly with walking up the hill. He denies chest pain. Fortunately, he has quit alcohol since 09/2021. He has gained several pounds of weight. He also reprots intermittent episodes of palpitations.    Current Outpatient Medications:  .  atorvastatin (LIPITOR) 20 MG tablet, Take 1 tablet (20 mg total) by mouth daily. (Patient not taking: Reported on 09/28/2021), Disp: 30 tablet, Rfl: 0 .  cloNIDine (CATAPRES) 0.1 MG tablet, Take 0.1 mg by mouth 2 (two) times daily., Disp: , Rfl:  .  escitalopram (LEXAPRO) 20 MG tablet, Take 20 mg by mouth daily., Disp: , Rfl:  .  finasteride (PROSCAR) 5 MG tablet, Take 5 mg by mouth daily., Disp: , Rfl:  .  gabapentin (NEURONTIN) 300 MG capsule, Take 300 mg by mouth 3 (three) times daily., Disp: , Rfl:  .  hydrOXYzine (ATARAX) 50 MG tablet, Take 50 mg by mouth at bedtime as needed for anxiety., Disp: , Rfl:  .  ibuprofen (ADVIL) 200 MG tablet, Take 200 mg by mouth daily as needed for moderate pain., Disp: , Rfl:  .  magnesium hydroxide (MILK OF MAGNESIA) 400 MG/5ML suspension, Take 30 mLs by mouth every other day., Disp: , Rfl:  .  methocarbamol (ROBAXIN) 500 MG tablet, Take 500 mg by mouth daily as needed for muscle spasms., Disp: , Rfl:  .  metoprolol tartrate (LOPRESSOR) 50 MG tablet, Take 50 mg by mouth 2 (two) times daily., Disp: , Rfl:  .  ondansetron (ZOFRAN) 4 MG tablet, Take 1 tablet (4 mg total) by mouth every 6 (six) hours as needed for nausea., Disp: 20 tablet, Rfl: 0 .  QUEtiapine Fumarate (SEROQUEL XR) 150 MG 24 hr tablet, Take 150 mg by mouth at bedtime., Disp: , Rfl:  .  rosuvastatin (CRESTOR) 20 MG tablet, Take 20 mg by mouth every Monday, Wednesday, and Friday.,  Disp: , Rfl:  .  senna-docusate (SENOKOT-S) 8.6-50 MG tablet, Take 1 tablet by mouth 2 (two) times daily as needed for moderate constipation., Disp: , Rfl:  .  tamsulosin (FLOMAX) 0.4 MG CAPS capsule, Take 0.4 mg by mouth., Disp: , Rfl:    Cardiovascular & other pertient studies:  Echocardiogram 04/27/2022: Study Quality: Poor Normal LV systolic function with visual EF 60-65%. Left ventricle cavity is normal in size. Mild concentric hypertrophy of the left ventricle. Normal global wall motion. Normal diastolic filling pattern, normal LAP.  No significant valvular heart disease. Compared to 08/26/2020 G1DD is now normal otherwise no significant change.   Exercise nuclear stress test 04/25/2022: Myocardial perfusion reveals mild diaphragmatic attenuation in the inferior wall. No ischemia.  Overall LV systolic function is moderately abnormal with global hypokinesis. Stress LV EF: 39%. The LV cavity being small in rest and stress could be contributing to error in calculation.  Assess LVEF with echocardiogram.   Normal ECG stress. The patient exercised for 2 minutes and 36 seconds of a Bruce protocol, achieving approximately 4.64 METs & 95% MPHR. Stress terminated due to fatigue and THR achieved. Markedly reduced exercise tolerance. The blood pressure response was normal. No previous exam available for comparison. Intermediate risk study due to reduced LVEF.     EKG 08/16/2020: Sinus tachycardia 110 bpm Frequent  PVCs RSR(V1) -nondiagnostic  Event monitor 11/2016: ccasional PAC, no other arrhythmia  Exercise treadmill stress test 01/2014: 7.3 METS No ischemic changes  Recent labs: 09/28/2021: Glucose 110, BUN/Cr 23/1.19. EGFR >60. Na/K 141/3.8. Rest of the CMP normal H/H 16/48. MCV 79. Platelets 201 HbA1C NA Lipid panel NA TSH 3.1 normal  08/09/2020: Glucose 101, BUN/Cr 13/1.17. EGFR 68. Na/K 137/3.6. Rest of the CMP normal H/H 14/42. MCV 88. Platelets 143 HbA1C NaA Lipid panel N/A TSH  3.8 normal BNP 78   Review of Systems  Cardiovascular:  Positive for dyspnea on exertion and palpitations. Negative for chest pain, leg swelling and syncope.         There were no vitals filed for this visit.    There is no height or weight on file to calculate BMI. There were no vitals filed for this visit.    Objective:   Physical Exam Vitals and nursing note reviewed.  Constitutional:      General: He is not in acute distress. Neck:     Vascular: No JVD.  Cardiovascular:     Rate and Rhythm: Regular rhythm. Tachycardia present.     Heart sounds: Normal heart sounds. No murmur heard. Pulmonary:     Effort: Pulmonary effort is normal.     Breath sounds: Normal breath sounds. No wheezing or rales.  Abdominal:     General: Abdomen is protuberant.  Musculoskeletal:     Left lower leg: No edema.          Assessment & Recommendations:   72 y.o. Caucasian male with aortic atherosclerosis, alcohol abuse, exertional dyspnea  Exertional dyspnea: Prior cardiac workup a few years ago had been unremarkable. With aortic atherosclerosis, elevated calcium score, check exercise nuclear stress test. Also recommend echocardiogram.  Palpitations: Left atrial enlargement on EKG. Check cardiac telemetry.   Aortic atherosclerosis, elevated calcium score: Continue statin    Ruben Mormon, MD Pager: [phone] Office: [phone]

## 2022-05-11 ENCOUNTER — Encounter: Payer: Self-pay | Admitting: Cardiology

## 2022-05-11 ENCOUNTER — Ambulatory Visit: Payer: Medicare HMO | Admitting: Cardiology

## 2022-05-11 VITALS — BP 112/67 | HR 77 | Resp 18 | Ht 71.0 in | Wt 226.6 lb

## 2022-05-11 DIAGNOSIS — R9439 Abnormal result of other cardiovascular function study: Secondary | ICD-10-CM | POA: Insufficient documentation

## 2022-05-11 DIAGNOSIS — R931 Abnormal findings on diagnostic imaging of heart and coronary circulation: Secondary | ICD-10-CM

## 2022-05-11 DIAGNOSIS — R0609 Other forms of dyspnea: Secondary | ICD-10-CM | POA: Diagnosis not present

## 2022-05-11 DIAGNOSIS — I700 Atherosclerosis of aorta: Secondary | ICD-10-CM

## 2022-05-11 DIAGNOSIS — R002 Palpitations: Secondary | ICD-10-CM | POA: Diagnosis not present

## 2022-05-12 DIAGNOSIS — R0609 Other forms of dyspnea: Secondary | ICD-10-CM | POA: Diagnosis not present

## 2022-05-13 LAB — CBC
Hematocrit: 48.6 % (ref 37.5–51.0)
Hemoglobin: 16.4 g/dL (ref 13.0–17.7)
MCH: 28.5 pg (ref 26.6–33.0)
MCHC: 33.7 g/dL (ref 31.5–35.7)
MCV: 84 fL (ref 79–97)
Platelets: 219 10*3/uL (ref 150–450)
RBC: 5.76 x10E6/uL (ref 4.14–5.80)
RDW: 13.5 % (ref 11.6–15.4)
WBC: 7.4 10*3/uL (ref 3.4–10.8)

## 2022-05-16 ENCOUNTER — Ambulatory Visit (HOSPITAL_COMMUNITY)
Admission: RE | Admit: 2022-05-16 | Discharge: 2022-05-16 | Disposition: A | Discharge status: Home / Self Care | Payer: Medicare HMO | Source: Ambulatory Visit | Attending: Cardiology | Admitting: Cardiology

## 2022-05-16 ENCOUNTER — Other Ambulatory Visit: Payer: Self-pay

## 2022-05-16 ENCOUNTER — Encounter (HOSPITAL_COMMUNITY)
Admission: RE | Disposition: A | Discharge status: Home / Self Care | Payer: Self-pay | Source: Ambulatory Visit | Attending: Cardiology

## 2022-05-16 ENCOUNTER — Encounter (HOSPITAL_COMMUNITY): Payer: Self-pay | Admitting: Cardiology

## 2022-05-16 DIAGNOSIS — R931 Abnormal findings on diagnostic imaging of heart and coronary circulation: Secondary | ICD-10-CM | POA: Diagnosis not present

## 2022-05-16 DIAGNOSIS — I700 Atherosclerosis of aorta: Secondary | ICD-10-CM | POA: Insufficient documentation

## 2022-05-16 DIAGNOSIS — R002 Palpitations: Secondary | ICD-10-CM | POA: Insufficient documentation

## 2022-05-16 DIAGNOSIS — R0609 Other forms of dyspnea: Secondary | ICD-10-CM | POA: Diagnosis not present

## 2022-05-16 HISTORY — PX: LEFT HEART CATH AND CORONARY ANGIOGRAPHY: CATH118249

## 2022-05-16 LAB — BASIC METABOLIC PANEL
Anion gap: 10 (ref 5–15)
BUN: 17 mg/dL (ref 8–23)
CO2: 22 mmol/L (ref 22–32)
Calcium: 8.6 mg/dL — ABNORMAL LOW (ref 8.9–10.3)
Chloride: 104 mmol/L (ref 98–111)
Creatinine, Ser: 1.3 mg/dL — ABNORMAL HIGH (ref 0.61–1.24)
GFR, Estimated: 59 mL/min — ABNORMAL LOW (ref >60)
Glucose, Bld: 105 mg/dL — ABNORMAL HIGH (ref 70–99)
Potassium: 3.9 mmol/L (ref 3.5–5.1)
Sodium: 136 mmol/L (ref 135–145)

## 2022-05-16 SURGERY — LEFT HEART CATH AND CORONARY ANGIOGRAPHY
Anesthesia: LOCAL

## 2022-05-16 MED ORDER — LIDOCAINE HCL (PF) 1 % IJ SOLN
INTRAMUSCULAR | Status: DC | PRN
  Administered 2022-05-16: 2 mL

## 2022-05-16 MED ORDER — HEPARIN (PORCINE) IN NACL 1000-0.9 UT/500ML-% IV SOLN
INTRAVENOUS | Status: AC
  Filled 2022-05-16: qty 500

## 2022-05-16 MED ORDER — FENTANYL CITRATE (PF) 100 MCG/2ML IJ SOLN
INTRAMUSCULAR | Status: AC
  Filled 2022-05-16: qty 2

## 2022-05-16 MED ORDER — SODIUM CHLORIDE 0.9 % IV SOLN: 250.0000 mL | INTRAVENOUS | Status: DC | PRN

## 2022-05-16 MED ORDER — SODIUM CHLORIDE 0.9 % WEIGHT BASED INFUSION
3.0000 mL/kg/h | INTRAVENOUS | Status: AC
  Administered 2022-05-16: 3 mL/kg/h via INTRAVENOUS

## 2022-05-16 MED ORDER — ASPIRIN 81 MG PO CHEW
81.0000 mg | CHEWABLE_TABLET | ORAL | Status: AC
  Administered 2022-05-16: 81 mg via ORAL
  Filled 2022-05-16: qty 1

## 2022-05-16 MED ORDER — SODIUM CHLORIDE 0.9% FLUSH: 3.0000 mL | INTRAVENOUS | Status: DC | PRN

## 2022-05-16 MED ORDER — ONDANSETRON HCL 4 MG/2ML IJ SOLN: 4.0000 mg | Freq: Four times a day (QID) | INTRAMUSCULAR | Status: DC | PRN

## 2022-05-16 MED ORDER — IOHEXOL 350 MG/ML SOLN
INTRAVENOUS | Status: DC | PRN
  Administered 2022-05-16: 50 mL via INTRA_ARTERIAL

## 2022-05-16 MED ORDER — FENTANYL CITRATE (PF) 100 MCG/2ML IJ SOLN
INTRAMUSCULAR | Status: DC | PRN
  Administered 2022-05-16: 50 ug via INTRAVENOUS

## 2022-05-16 MED ORDER — HEPARIN SODIUM (PORCINE) 1000 UNIT/ML IJ SOLN
INTRAMUSCULAR | Status: DC | PRN
  Administered 2022-05-16: 5000 [IU] via INTRAVENOUS

## 2022-05-16 MED ORDER — LIDOCAINE HCL (PF) 1 % IJ SOLN
INTRAMUSCULAR | Status: AC
  Filled 2022-05-16: qty 30

## 2022-05-16 MED ORDER — MIDAZOLAM HCL 2 MG/2ML IJ SOLN
INTRAMUSCULAR | Status: AC
  Filled 2022-05-16: qty 2

## 2022-05-16 MED ORDER — SODIUM CHLORIDE 0.9% FLUSH: 3.0000 mL | Freq: Two times a day (BID) | INTRAVENOUS | Status: DC

## 2022-05-16 MED ORDER — HEPARIN (PORCINE) IN NACL 1000-0.9 UT/500ML-% IV SOLN
INTRAVENOUS | Status: DC | PRN
  Administered 2022-05-16 (×2): 500 mL

## 2022-05-16 MED ORDER — HEPARIN SODIUM (PORCINE) 1000 UNIT/ML IJ SOLN
INTRAMUSCULAR | Status: AC
  Filled 2022-05-16: qty 10

## 2022-05-16 MED ORDER — LABETALOL HCL 5 MG/ML IV SOLN: 10.0000 mg | INTRAVENOUS | Status: DC | PRN

## 2022-05-16 MED ORDER — SODIUM CHLORIDE 0.9 % IV SOLN: INTRAVENOUS | Status: AC

## 2022-05-16 MED ORDER — MIDAZOLAM HCL 2 MG/2ML IJ SOLN
INTRAMUSCULAR | Status: DC | PRN
  Administered 2022-05-16: 1 mg via INTRAVENOUS

## 2022-05-16 MED ORDER — SODIUM CHLORIDE 0.9 % WEIGHT BASED INFUSION: 1.0000 mL/kg/h | INTRAVENOUS | Status: DC

## 2022-05-16 MED ORDER — VERAPAMIL HCL 2.5 MG/ML IV SOLN
INTRAVENOUS | Status: DC | PRN
  Administered 2022-05-16: 10 mL via INTRA_ARTERIAL

## 2022-05-16 MED ORDER — VERAPAMIL HCL 2.5 MG/ML IV SOLN
INTRAVENOUS | Status: AC
  Filled 2022-05-16: qty 2

## 2022-05-16 MED ORDER — ACETAMINOPHEN 325 MG PO TABS: 650.0000 mg | ORAL_TABLET | ORAL | Status: DC | PRN

## 2022-05-16 MED ORDER — ASPIRIN 81 MG PO CHEW: 81.0000 mg | CHEWABLE_TABLET | ORAL | Status: DC

## 2022-05-16 MED ORDER — HYDRALAZINE HCL 20 MG/ML IJ SOLN: 10.0000 mg | INTRAMUSCULAR | Status: DC | PRN

## 2022-05-16 SURGICAL SUPPLY — 11 items
CATH 5FR JL3.5 JR4 ANG PIG MP (CATHETERS) IMPLANT
CATH LAUNCHER 5F EBU3.0 (CATHETERS) IMPLANT
CATHETER LAUNCHER 5F EBU3.0 (CATHETERS) ×1 IMPLANT
DEVICE RAD COMP TR BAND LRG (VASCULAR PRODUCTS) IMPLANT
GLIDESHEATH SLEND A-KIT 6F 22G (SHEATH) IMPLANT
GUIDEWIRE INQWIRE 1.5J.035X260 (WIRE) IMPLANT
INQWIRE 1.5J .035X260CM (WIRE) ×1 IMPLANT
KIT HEART LEFT (KITS) ×2 IMPLANT
PACK CARDIAC CATHETERIZATION (CUSTOM PROCEDURE TRAY) ×2 IMPLANT
TRANSDUCER W/STOPCOCK (MISCELLANEOUS) ×2 IMPLANT
TUBING CIL FLEX 10 FLL-RA (TUBING) ×2 IMPLANT

## 2022-05-16 NOTE — H&P (Signed)
OV 05/11/2022 copied for documentation    Follow up visit  Subjective:   Ruben [name], male    DOB: Apr 30, 1950, 71 y.o.   MRN: [id number]   HPI   Chief Complaint  Patient presents with   Shortness of Breath   Follow-up    Test results    72 y.o. Caucasian male with aortic atherosclerosis, alcohol abuse, exertional dyspnea  Patient has continued to have exertional dyspnea. Reviewed recent test results with the patient, details below.     Current Outpatient Medications:    atorvastatin (LIPITOR) 20 MG tablet, Take 1 tablet (20 mg total) by mouth daily. (Patient not taking: Reported on 09/28/2021), Disp: 30 tablet, Rfl: 0   cloNIDine (CATAPRES) 0.1 MG tablet, Take 0.1 mg by mouth 2 (two) times daily., Disp: , Rfl:    escitalopram (LEXAPRO) 20 MG tablet, Take 20 mg by mouth daily., Disp: , Rfl:    finasteride (PROSCAR) 5 MG tablet, Take 5 mg by mouth daily., Disp: , Rfl:    gabapentin (NEURONTIN) 300 MG capsule, Take 300 mg by mouth 3 (three) times daily., Disp: , Rfl:    hydrOXYzine (ATARAX) 50 MG tablet, Take 50 mg by mouth at bedtime as needed for anxiety., Disp: , Rfl:    ibuprofen (ADVIL) 200 MG tablet, Take 200 mg by mouth daily as needed for moderate pain., Disp: , Rfl:    magnesium hydroxide (MILK OF MAGNESIA) 400 MG/5ML suspension, Take 30 mLs by mouth every other day., Disp: , Rfl:    methocarbamol (ROBAXIN) 500 MG tablet, Take 500 mg by mouth daily as needed for muscle spasms., Disp: , Rfl:    metoprolol tartrate (LOPRESSOR) 50 MG tablet, Take 50 mg by mouth 2 (two) times daily., Disp: , Rfl:    ondansetron (ZOFRAN) 4 MG tablet, Take 1 tablet (4 mg total) by mouth every 6 (six) hours as needed for nausea., Disp: 20 tablet, Rfl: 0   QUEtiapine Fumarate (SEROQUEL XR) 150 MG 24 hr tablet, Take 150 mg by mouth at bedtime., Disp: , Rfl:    rosuvastatin (CRESTOR) 20 MG tablet, Take 20 mg by mouth every Monday, Wednesday, and Friday., Disp: , Rfl:    senna-docusate (SENOKOT-S)  8.6-50 MG tablet, Take 1 tablet by mouth 2 (two) times daily as needed for moderate constipation., Disp: , Rfl:    tamsulosin (FLOMAX) 0.4 MG CAPS capsule, Take 0.4 mg by mouth., Disp: , Rfl:    Cardiovascular & other pertient studies:  Mobile cardiac telemetry 7 days 04/13/2022 - 04/20/2022: Dominant rhythm: Sinus. HR 47-121 bpm. Avg HR 66 bpm, in sinus rhythm. 5 episodes of probable atrial tachycardia, fastest at 171 bpm for 5 beats, longest for 13.8 secs at 137 bpm. <1% isolated SVE, couplets. 0 episodes of VT. <1% isolated VE, no couplet/triplets. No atrial fibrillation/atrial flutter/VT/high grade AV block, sinus pause >3sec noted. 0 patient triggered events.   Echocardiogram 04/27/2022: Study Quality: Poor Normal LV systolic function with visual EF 60-65%. Left ventricle cavity is normal in size. Mild concentric hypertrophy of the left ventricle. Normal global wall motion. Normal diastolic filling pattern, normal LAP.  No significant valvular heart disease. Compared to 08/26/2020 G1DD is now normal otherwise no significant change.   Exercise nuclear stress test 04/25/2022: Myocardial perfusion reveals mild diaphragmatic attenuation in the inferior wall. No ischemia.  Overall LV systolic function is moderately abnormal with global hypokinesis. Stress LV EF: 39%. The LV cavity being small in rest and stress could be contributing to error in calculation.  Assess LVEF with echocardiogram.   Normal ECG stress. The patient exercised for 2 minutes and 36 seconds of a Bruce protocol, achieving approximately 4.64 METs & 95% MPHR. Stress terminated due to fatigue and THR achieved. Markedly reduced exercise tolerance. The blood pressure response was normal. No previous exam available for comparison. Intermediate risk study due to reduced LVEF.    CT cardiac scoring 03/01/2021: LM: 0 LAD: 35.8 LCx: 0 RCA: 0   Total Agatston Score: 35.8 MESA database percentile: 31  EKG 08/16/2020: Sinus  tachycardia 110 bpm Frequent PVCs RSR(V1) -nondiagnostic  Event monitor 11/2016: ccasional PAC, no other arrhythmia  Exercise treadmill stress test 01/2014: 7.3 METS No ischemic changes  Recent labs: 05/04/2022: Glucose 97, BUN/Cr 19/1.12. EGFR 70. Na/K 137/4.5. Rest of the CMP normal HbA1C 6.2%  09/28/2021: Glucose 110, BUN/Cr 23/1.19. EGFR >60. Na/K 141/3.8. Rest of the CMP normal H/H 16/48. MCV 79. Platelets 201 HbA1C NA Lipid panel NA TSH 3.1 normal  08/09/2020: Glucose 101, BUN/Cr 13/1.17. EGFR 68. Na/K 137/3.6. Rest of the CMP normal H/H 14/42. MCV 88. Platelets 143 HbA1C NaA Lipid panel N/A TSH 3.8 normal BNP 78   Review of Systems  Cardiovascular:  Positive for dyspnea on exertion and palpitations. Negative for chest pain, leg swelling and syncope.         Vitals:   05/11/22 1128  BP: 112/67  Pulse: 77  Resp: 18  SpO2: 95%     Body mass index is 31.6 kg/m. Filed Weights   05/11/22 1128  Weight: 226 lb 9.6 oz (102.8 kg)     Objective:   Physical Exam Vitals and nursing note reviewed.  Constitutional:      General: He is not in acute distress. Neck:     Vascular: No JVD.  Cardiovascular:     Rate and Rhythm: Regular rhythm. Tachycardia present.     Heart sounds: Normal heart sounds. No murmur heard. Pulmonary:     Effort: Pulmonary effort is normal.     Breath sounds: Normal breath sounds. No wheezing or rales.  Abdominal:     General: Abdomen is protuberant.  Musculoskeletal:     Right lower leg: No edema.     Left lower leg: No edema.           Assessment & Recommendations:   72 y.o. Caucasian male with aortic atherosclerosis, alcohol abuse, exertional dyspnea  Exertional dyspnea: Profound symptoms with minimal physical activity.  Echocardiogram with structurally normal heart.  Stress test with very low exercise capacity, reduced stress LVEF, inferior diaphragmatic attenuation, although cannot exclude ischemia. Given  persistent exertional dyspnea, elevated coronary calcium score, episodes of atrial tachycardia, recommend invasive coronary angiography for definitive coronary anatomy evaluation. Risks, benefits, alternate options discussed with the patient in detail. Recent BMP results as above. Will check CBC.  Palpitations: Left atrial enlargement on EKG. Check cardiac telemetry.   Aortic atherosclerosis, elevated calcium score: Continue statin    [name], MD Pager: [phone] Office: [phone]

## 2022-05-16 NOTE — Interval H&P Note (Signed)
History and Physical Interval Note:  05/16/2022 7:42 AM  Ruben [name]  has presented today for surgery, with the diagnosis of positive stress test, shortness of breath.  The various methods of treatment have been discussed with the patient and family. After consideration of risks, benefits and other options for treatment, the patient has consented to  Procedure(s): LEFT HEART CATH AND CORONARY ANGIOGRAPHY (N/A) as a surgical intervention.  The patient's history has been reviewed, patient examined, no change in status, stable for surgery.  I have reviewed the patient's chart and labs.  Questions were answered to the patient's satisfaction.     2016/2017 Appropriate Use Criteria for Coronary Revascularization Symptom Status: Ischemic Symptoms  Non-invasive Testing: Intermediate Risk  If no or indeterminate stress test, FFR/iFR results in all diseased vessels: N/A  Diabetes Mellitus: No  S/P CABG: No  Antianginal therapy (number of long-acting drugs): 1  Patient undergoing renal transplant: No  Patient undergoing percutaneous valve procedure: No  1 Vessel Disease PCI CABG  No proximal LAD involvement, No proximal left dominant LCX involvement M (6); Indication 2 M (4); Indication 2  Proximal left dominant LCX involvement A (7); Indication 5 A (7); Indication 5  Proximal LAD involvement A (7); Indication 5 A (7); Indication 5  2 Vessel Disease  No proximal LAD involvement A (7); Indication 8 M (6); Indication 8  Proximal LAD involvement A (7); Indication 11 A (7); Indication 11  3 Vessel Disease  Low disease complexity (e.g., focal stenoses, SYNTAX <=22) A (7); Indication 17 A (8); Indication 17  Intermediate or high disease complexity (e.g., SYNTAX >=23) M (6); Indication 21 A (8); Indication 21  Left Main Disease  Isolated LMCA disease: ostial or midshaft A (7); Indication 24 A (9); Indication 24  Isolated LMCA disease: bifurcation involvement M (5); Indication 25 A (9); Indication 25   LMCA ostial or midshaft, concurrent low disease burden multivessel disease (e.g., 1-2 additional focal stenoses, SYNTAX <=22) A (7); Indication 26 A (9); Indication 26  LMCA ostial or midshaft, concurrent intermediate or high disease burden multivessel disease (e.g., 1-2 additional bifurcation stenoses, long stenoses, SYNTAX >=23) M (4); Indication 27 A (9); Indication 27  LMCA bifurcation involvement, concurrent low disease burden multivessel disease (e.g., 1-2 additional focal stenoses, SYNTAX <=22) M (5); Indication 28 A (9); Indication 28  LMCA bifurcation involvement, concurrent intermediate or high disease burden multivessel disease (e.g., 1-2 additional bifurcation stenoses, long stenoses, SYNTAX >=23) R (3); Indication 29 A (9); Indication [place]

## 2022-05-16 NOTE — Progress Notes (Signed)
Called into Cath Lab room- asked that Dr. Virgina Jock do Med Rec.

## 2022-05-17 DIAGNOSIS — R3914 Feeling of incomplete bladder emptying: Secondary | ICD-10-CM | POA: Diagnosis not present

## 2022-05-17 DIAGNOSIS — N401 Enlarged prostate with lower urinary tract symptoms: Secondary | ICD-10-CM | POA: Diagnosis not present

## 2022-05-31 DIAGNOSIS — F5101 Primary insomnia: Secondary | ICD-10-CM | POA: Diagnosis not present

## 2022-05-31 DIAGNOSIS — F1021 Alcohol dependence, in remission: Secondary | ICD-10-CM | POA: Diagnosis not present

## 2022-05-31 DIAGNOSIS — R0609 Other forms of dyspnea: Secondary | ICD-10-CM | POA: Diagnosis not present

## 2022-05-31 DIAGNOSIS — F3341 Major depressive disorder, recurrent, in partial remission: Secondary | ICD-10-CM | POA: Diagnosis not present

## 2022-06-15 ENCOUNTER — Ambulatory Visit: Payer: Medicare HMO | Admitting: Internal Medicine

## 2022-06-15 ENCOUNTER — Encounter: Payer: Self-pay | Admitting: Internal Medicine

## 2022-06-15 VITALS — BP 128/80 | HR 67 | Temp 98.7°F | Ht 71.0 in | Wt 228.0 lb

## 2022-06-15 DIAGNOSIS — R0609 Other forms of dyspnea: Secondary | ICD-10-CM | POA: Diagnosis not present

## 2022-06-15 NOTE — Progress Notes (Signed)
Ruben [name]    [id number]    1950/12/07  Primary Care Physician:Brake, Beryle Lathe, FNP  Referring Physician: Kristen Loader, [address] [place],  [place] [address] Reason for Consultation: dyspnea on exertion Date of Consultation: 06/15/2022  Chief complaint:   Chief Complaint  Patient presents with   Consult    SOB on exertion      HPI: Ruben [name] is a 72 y.o. who presents with dyspnea on exertion which has been going on for 10-15 years. Symptoms progressing over the last several months. Has no problem with ADLs, but going up stairs and climbing a hill cause dyspnea.   Has seen Dr. Virgina Jock in cardiology and has had work up including heart montior, echocardiogram, perfusion scan and LHC.   Echocardiogram Jan 2024 Poor quality study Normal LV systolic function with visual EF 60-65%. Left ventricle cavity  is normal in size. Mild concentric hypertrophy of the left ventricle.  Normal global wall motion. Normal diastolic filling pattern, normal LAP.  No significant valvular heart disease.   LHC Feb 2024 Angiographically normal coronary arteries No significant coronary artery disease Normal LVEDP Normal LVEF   He notes proximal muscle weakness of his upper arms and upper thighs which has been going on more recently over the past few weeks.  No fevers, chills, skin rashes, unintentional weight loss, no eye pain or redness. Has history of hiatal hernia and some reflux.   No chest pain, palpitations No coughing or wheezing. No interruptions in his sleep.   No childhood respiratory disease Never had pneumonia or bronchitis.   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  Exposures: lives alone at home.  Smoking history: no tobacco smoking.    Social History   Occupational History   Not on file  Tobacco Use   Smoking status: Never   Smokeless tobacco: Never  Vaping Use   Vaping Use: Never used  Substance  and Sexual Activity   Alcohol use: Not Currently    Comment: 10/07/2021   Drug use: Not Currently    Types: Marijuana    Comment: 1980's   Sexual activity: Not on file    Relevant family history:  Family History  Problem Relation Age of Onset   Heart failure Mother    Stroke Father    Lung disease Neg Hx     Past Medical History:  Diagnosis Date   Alcoholism ([place])    Alcoholism ([place])    Aortic atherosclerosis ([place]) 12/26/2016   Noted on CT   Arthritis    BPH (benign prostatic hyperplasia)    Chronic constipation    takes magnesium   Duodenal ulcer    Dyspnea 12/17/2017   ED (erectile dysfunction)    Foley catheter in place    history of no longer in place   GERD (gastroesophageal reflux disease)    Heart palpitations    Occ   Hiatal hernia    History of adenomatous polyp of colon    tubular adenoma's [id number]   History of Helicobacter pylori infection    2008   Hypercholesteremia    Hypertension    MR (mitral regurgitation)    Mild, noted on ECHO   Pre-diabetes    Pulmonary nodule 12/26/2016   noted on CT, 5 x 3 mm nodular opacity right upper lobe   Rosacea    Schatzki's ring    last dilated 02/ 2016   Urinary retention  history of   Varicocele 02/25/2014   Small to moderate left varicocele    Past Surgical History:  Procedure Laterality Date   BALLOON DILATION N/A 06/08/2014   Procedure: BALLOON DILATION;  Surgeon: Garlan Fair, MD;  Location: [facility];  Service: Endoscopy;  Laterality: N/A;   COLONOSCOPY WITH PROPOFOL N/A 06/08/2014   Procedure: COLONOSCOPY WITH PROPOFOL;  Surgeon: Garlan Fair, MD;  [facility]: [facility];  Service: Endoscopy;  Laterality: N/A;   ESOPHAGOGASTRODUODENOSCOPY N/A 06/08/2014   Procedure: ESOPHAGOGASTRODUODENOSCOPY (EGD);  Surgeon: Garlan Fair, MD;  Location: [facility];  Service: Endoscopy;  Laterality: N/A;   ESOPHAGOGASTRODUODENOSCOPY (EGD) WITH ESOPHAGEAL DILATION N/A 12/31/2012   Procedure:  ESOPHAGOGASTRODUODENOSCOPY (EGD) WITH ESOPHAGEAL DILATION;  Surgeon: Garlan Fair, MD;  [facility];  Service: Endoscopy;  Laterality: N/A;   GREEN LIGHT LASER TURP (TRANSURETHRAL RESECTION OF PROSTATE N/A 12/28/2015   Procedure: GREEN LIGHT LASER TURP (TRANSURETHRAL RESECTION OF PROSTATE;  Surgeon: Festus Aloe, MD;  Location: [facility];  Service: Urology;  Laterality: N/A;   LEFT HEART CATH AND CORONARY ANGIOGRAPHY N/A 05/16/2022   Procedure: LEFT HEART CATH AND CORONARY ANGIOGRAPHY;  Surgeon: [name], MD;  Location: [facility];  Service: Cardiovascular;  Laterality: N/A;     Physical Exam: Blood pressure 128/80, pulse 67, temperature 98.7 F (37.1 C), temperature source Oral, height '5\' 11"'$  (1.803 m), weight 228 lb (103.4 kg), SpO2 95 %. Gen:      No acute distress ENT:  no nasal polyps, mucus membranes moist Lungs:    No increased respiratory effort, symmetric chest wall excursion, clear to auscultation bilaterally, no wheezes or crackles CV:         Regular rate and rhythm; no murmurs, rubs, or gallops.  No pedal edema Abd:      + bowel sounds; soft, non-tender; no distension MSK: no acute synovitis of DIP or PIP joints, no mechanics hands.  Skin:      Warm and dry; no rashes Neuro: normal speech, no focal facial asymmetry. Strength and muscle bulk and tone normal in upper and lower extremities.  Psych: alert and oriented x3, normal mood and affect   Data Reviewed/Medical Decision Making:  Independent interpretation of tests: Imaging:  Review of patient's CT cardiac scoring Nov 2022 images revealed mild peribronchial thickening and lower lobe predominant bronchiectasis in visualized lung windows. The patient's images have been independently reviewed by me.    PFTs:  Labs:  Lab Results  Component Value Date   NA 136 05/16/2022   K 3.9 05/16/2022   CO2 22 05/16/2022   GLUCOSE 105 (H) 05/16/2022   BUN 17 05/16/2022    CREATININE 1.30 (H) 05/16/2022   CALCIUM 8.6 (L) 05/16/2022   EGFR 60 02/09/2021   GFRNONAA 59 (L) 05/16/2022   Lab Results  Component Value Date   WBC 7.4 05/12/2022   HGB 16.4 05/12/2022   HCT 48.6 05/12/2022   MCV 84 05/12/2022   PLT 219 05/12/2022     Immunization status:  Immunization History  Administered Date(s) Administered   Influenza Split 02/17/2009, 01/29/2012, 02/05/2014, 12/11/2014   Influenza,inj,Quad PF,6+ Mos 05/21/2020   Moderna Sars-Covid-2 Vaccination 03/15/2022   PFIZER(Purple Top)SARS-COV-2 Vaccination 07/10/2019, 07/31/2019, 05/21/2020   Pfizer Covid-19 Vaccine Bivalent Booster 52yr & up 04/20/2021   Pneumococcal Polysaccharide-23 02/23/2014, 04/28/2018   Td (Adult) 05/19/2003   Tdap 10/21/2010     I reviewed prior external note(s) from cardiology  I reviewed the result(s) of the labs and  imaging as noted above.   I have ordered PFT   Assessment:  Dyspnea on exertion  Plan/Recommendations:  Cardiac causes have been ruled out. Differential includes neuromuscular weakness, deconditioning, OSAless likely intrinsic lung disease,   Will proceed with full set of PFTs. I will see him back after this.  Discussed trial of albuterol inhaler, but less likely to be of benefit due to symptoms not really being consistent with asthma.   He is c/o some bilateral proximal muscle ache and weakness. Strength is good today. Would consider aldolase/ck and additional work up pending results of PFTS.   We discussed disease management and progression at length today.    Return to Care: Return in about 4 weeks (around 07/13/2022).  Lenice Llamas, MD Pulmonary and [place]  CC: Kristen Loader, FNP

## 2022-06-15 NOTE — Patient Instructions (Signed)
Please schedule follow up scheduled with myself in 1 months.  If my schedule is not open yet, we will contact you with a reminder closer to that time. Please call [phone] if you haven't heard from [place] a month before.   Before your next visit I would like you to have: Full set of PFTs - 1 hour, visit with me afterwards to review results.

## 2022-06-26 DIAGNOSIS — I10 Essential (primary) hypertension: Secondary | ICD-10-CM | POA: Diagnosis not present

## 2022-06-26 DIAGNOSIS — F331 Major depressive disorder, recurrent, moderate: Secondary | ICD-10-CM | POA: Diagnosis not present

## 2022-06-26 DIAGNOSIS — F1021 Alcohol dependence, in remission: Secondary | ICD-10-CM | POA: Diagnosis not present

## 2022-06-26 DIAGNOSIS — F5101 Primary insomnia: Secondary | ICD-10-CM | POA: Diagnosis not present

## 2022-06-30 ENCOUNTER — Other Ambulatory Visit: Payer: Self-pay

## 2022-06-30 DIAGNOSIS — R0609 Other forms of dyspnea: Secondary | ICD-10-CM

## 2022-07-04 ENCOUNTER — Ambulatory Visit (INDEPENDENT_AMBULATORY_CARE_PROVIDER_SITE_OTHER): Payer: Medicare HMO | Admitting: Internal Medicine

## 2022-07-04 ENCOUNTER — Encounter: Payer: Self-pay | Admitting: Internal Medicine

## 2022-07-04 ENCOUNTER — Ambulatory Visit: Payer: Medicare HMO | Admitting: Internal Medicine

## 2022-07-04 VITALS — BP 114/70 | HR 60 | Temp 97.9°F | Ht 71.0 in | Wt 225.0 lb

## 2022-07-04 DIAGNOSIS — R0609 Other forms of dyspnea: Secondary | ICD-10-CM | POA: Diagnosis not present

## 2022-07-04 DIAGNOSIS — G4730 Sleep apnea, unspecified: Secondary | ICD-10-CM | POA: Diagnosis not present

## 2022-07-04 DIAGNOSIS — M6281 Muscle weakness (generalized): Secondary | ICD-10-CM | POA: Diagnosis not present

## 2022-07-04 DIAGNOSIS — R0602 Shortness of breath: Secondary | ICD-10-CM | POA: Diagnosis not present

## 2022-07-04 LAB — PULMONARY FUNCTION TEST
DL/VA % pred: 108 %
DL/VA: 4.37 ml/min/mmHg/L
DLCO cor % pred: 84 %
DLCO cor: 22.5 ml/min/mmHg
DLCO unc % pred: 88 %
DLCO unc: 23.56 ml/min/mmHg
FEF 25-75 Pre: 2.16 L/sec
FEF2575-%Pred-Pre: 84 %
FEV1-%Pred-Pre: 72 %
FEV1-Pre: 2.44 L
FEV1FVC-%Pred-Pre: 104 %
FEV6-%Pred-Pre: 72 %
FEV6-Pre: 3.13 L
FEV6FVC-%Pred-Pre: 105 %
FVC-%Pred-Pre: 68 %
FVC-Pre: 3.16 L
Pre FEV1/FVC ratio: 77 %
Pre FEV6/FVC Ratio: 100 %
RV % pred: 62 %
RV: 1.57 L
TLC % pred: 65 %
TLC: 4.75 L

## 2022-07-04 LAB — CK: Total CK: 87 U/L (ref 7–232)

## 2022-07-04 NOTE — Patient Instructions (Signed)
Please schedule follow up scheduled with myself in 4 months.  If my schedule is not open yet, we will contact you with a reminder closer to that time. Please call [phone] if you haven't heard from [place] a month before.   Before your next visit I would like you to have: Home sleep study  Referral to neurology [place] office will call to schedule both of those.  Please get some blood work on your way out today.

## 2022-07-04 NOTE — Progress Notes (Signed)
Ruben [name], DLCO and Pleth performed today. Pt was unable to complete the Ruben [name], so there is no post [name].

## 2022-07-04 NOTE — Patient Instructions (Signed)
Pre Ruben [name], DLCO and Pleth performed today. Pt was unable to complete the pre Ruben [name], so there is no post Ruben [name].

## 2022-07-04 NOTE — Progress Notes (Signed)
Ruben [name]    [id number]    29-Dec-1950  Primary Care Physician:Brake, Beryle Lathe, FNP Date of Appointment: 07/04/2022 Established Patient Visit  Chief complaint:   Chief Complaint  Patient presents with   Follow-up    SOB with exertion persistent.  Review PFT from today     HPI: Ruben [name] is a 72 y.o. man, non smoker, who presents with shortness of breath and proximal muscle weakness x 15 years.  Interval Updates: Here for follow up after PFTS which show mild restriction to ventilation with no airflow limitation and normal diffusion capacity. No change in dyspnea or proximal muscle weakness which sometimes requires him to use his hands to help him stand from seated position.   He wakes up startled in the middle of the night. He has excessive daytime sleepiness. He does snore in his sleep.     I have reviewed the patient's family social and past medical history and updated as appropriate.   Past Medical History:  Diagnosis Date   Alcoholism ([place])    Alcoholism ([place])    Aortic atherosclerosis ([place]) 12/26/2016   Noted on CT   Arthritis    BPH (benign prostatic hyperplasia)    Chronic constipation    takes magnesium   Duodenal ulcer    Dyspnea 12/17/2017   ED (erectile dysfunction)    Foley catheter in place    history of no longer in place   GERD (gastroesophageal reflux disease)    Heart palpitations    Occ   Hiatal hernia    History of adenomatous polyp of colon    tubular adenoma's [id number]   History of Helicobacter pylori infection    2008   Hypercholesteremia    Hypertension    MR (mitral regurgitation)    Mild, noted on ECHO   Pre-diabetes    Pulmonary nodule 12/26/2016   noted on CT, 5 x 3 mm nodular opacity right upper lobe   Rosacea    Schatzki's ring    last dilated 02/ 2016   Urinary retention    history of   Varicocele 02/25/2014   Small to moderate left varicocele    Past Surgical History:  Procedure Laterality Date    BALLOON DILATION N/A 06/08/2014   Procedure: BALLOON DILATION;  Surgeon: Garlan Fair, MD;  [facility];  Service: Endoscopy;  Laterality: N/A;   COLONOSCOPY WITH PROPOFOL N/A 06/08/2014   Procedure: COLONOSCOPY WITH PROPOFOL;  Surgeon: Garlan Fair, MD;  [facility];  Service: Endoscopy;  Laterality: N/A;   ESOPHAGOGASTRODUODENOSCOPY N/A 06/08/2014   Procedure: ESOPHAGOGASTRODUODENOSCOPY (EGD);  Surgeon: Garlan Fair, MD;  Location: [facility];  Service: Endoscopy;  Laterality: N/A;   ESOPHAGOGASTRODUODENOSCOPY (EGD) WITH ESOPHAGEAL DILATION N/A 12/31/2012   Procedure: ESOPHAGOGASTRODUODENOSCOPY (EGD) WITH ESOPHAGEAL DILATION;  Surgeon: Garlan Fair, MD;  [facility];  Service: Endoscopy;  Laterality: N/A;   GREEN LIGHT LASER TURP (TRANSURETHRAL RESECTION OF PROSTATE N/A 12/28/2015   Procedure: GREEN LIGHT LASER TURP (TRANSURETHRAL RESECTION OF PROSTATE;  Surgeon: Festus Aloe, MD;  Location: [facility];  Service: Urology;  Laterality: N/A;   LEFT HEART CATH AND CORONARY ANGIOGRAPHY N/A 05/16/2022   Procedure: LEFT HEART CATH AND CORONARY ANGIOGRAPHY;  Surgeon: [name], MD;  Location: [facility];  Service: Cardiovascular;  Laterality: N/A;    Family History  Problem Relation Age of Onset   Heart failure Mother    Stroke Father  Lung disease Neg Hx     Social History   Occupational History   Not on file  Tobacco Use   Smoking status: Never   Smokeless tobacco: Never  Vaping Use   Vaping Use: Never used  Substance and Sexual Activity   Alcohol use: Not Currently    Comment: 10/07/2021   Drug use: Not Currently    Types: Marijuana    Comment: 1980's   Sexual activity: Not on file     Physical Exam: Blood pressure 114/70, pulse 60, temperature 97.9 F (36.6 C), temperature source Oral, height 5\' 11"  (1.803 m), weight 225 lb (102.1 kg), SpO2 97 %.  Gen:      No acute distress, truncal  obesity ENT:  no nasal polyps, mucus membranes moist Lungs:    No increased respiratory effort, symmetric chest wall excursion, clear to auscultation bilaterally, no wheezes or crackles CV:         Regular rate and rhythm; no murmurs, rubs, or gallops.  No pedal edema Neuro: strength appears 5/5 in bilateral upper and lower extremities  Data Reviewed: Imaging: I have personally reviewed the CT cardiac scoring - visualized lung windows wnl  PFTs:     Latest Ref Rng & Units 06/30/2022    8:44 AM  PFT Results  FVC-Pre L 3.16  P  FVC-Predicted Pre % 68  P  Pre FEV1/FVC % % 77  P  FEV1-Pre L 2.44  P  FEV1-Predicted Pre % 72  P  DLCO uncorrected ml/min/mmHg 23.56  P  DLCO UNC% % 88  P  DLCO corrected ml/min/mmHg 22.50  P  DLCO COR %Predicted % 84  P  DLVA Predicted % 108  P  TLC L 4.75  P  TLC % Predicted % 65  P  RV % Predicted % 62  P    P Preliminary result   I have personally reviewed the patient's PFTs and mild restriction to ventilation  Labs:  Immunization status: Immunization History  Administered Date(s) Administered   Influenza Split 02/17/2009, 01/29/2012, 02/05/2014, 12/11/2014   Influenza,inj,Quad PF,6+ Mos 05/21/2020   Influenza-Unspecified 12/13/2020   Moderna Sars-Covid-2 Vaccination 03/15/2022   PFIZER(Purple Top)SARS-COV-2 Vaccination 07/10/2019, 07/31/2019, 05/21/2020   Pfizer Covid-19 Vaccine Bivalent Booster 28yrs & up 04/20/2021   Pneumococcal Polysaccharide-23 02/23/2014, 04/28/2018   Td (Adult) 05/19/2003   Tdap 10/21/2010    External Records Personally Reviewed:   Assessment:  Dyspnea on exertion Proximal Muscle Weakness Concern for OSA  Plan/Recommendations: Breathing testing consistent with mild restriction to ventilation. Most likely explanation - body habitus, less likely muscle/diaphragm weakness. In the context of proximal muscle weakness want to r/o inflammatory myopathy. Will check aldolase and CK. Will refer to neurology. Reassuring  that he has normal oxygen levels, no evidence of ILD, and that his symptoms having been going on for 15 years makes it less likely this is an inflammatory myopathy.  I will order a home study to evaluate for sleep apnea.   Return to Care: Return in about 4 months (around 11/03/2022).   Lenice Llamas, MD Pulmonary and [place]

## 2022-07-06 LAB — ALDOLASE: Aldolase: 5.9 U/L (ref <=8.1)

## 2022-07-11 ENCOUNTER — Encounter: Payer: Self-pay | Admitting: Neurology

## 2022-07-23 DIAGNOSIS — G4730 Sleep apnea, unspecified: Secondary | ICD-10-CM | POA: Diagnosis not present

## 2022-07-25 ENCOUNTER — Ambulatory Visit: Payer: Medicare HMO | Admitting: Neurology

## 2022-07-25 ENCOUNTER — Encounter: Payer: Self-pay | Admitting: Neurology

## 2022-07-25 ENCOUNTER — Other Ambulatory Visit (INDEPENDENT_AMBULATORY_CARE_PROVIDER_SITE_OTHER): Payer: Medicare HMO

## 2022-07-25 VITALS — BP 118/68 | HR 77 | Ht 71.0 in | Wt 225.0 lb

## 2022-07-25 DIAGNOSIS — R531 Weakness: Secondary | ICD-10-CM

## 2022-07-25 DIAGNOSIS — R202 Paresthesia of skin: Secondary | ICD-10-CM | POA: Diagnosis not present

## 2022-07-25 LAB — FOLATE: Folate: 7.9 ng/mL (ref >5.9)

## 2022-07-25 LAB — VITAMIN B12: Vitamin B-12: 153 pg/mL — ABNORMAL LOW (ref 211–911)

## 2022-07-25 NOTE — Progress Notes (Signed)
[facility] Clinic Note - Initial Visit   Date: 07/25/2022   Ruben [name] MRN: [id number] DOB: [birth year]   Dear Dr. Celine Mans:  Thank you for your kind referral of Ruben [name] for consultation of generalized weakness. Although his history is well known to you, please allow [place] to reiterate it for the purpose of our medical record. The patient was accompanied to the clinic by self.     Ruben [name] is a 72 y.o. right-handed male with CAD, depression, history of alcoholism (sober since 09/2021) presenting for evaluation of generalized weakness.   IMPRESSION/PLAN: Subjective weakness of the proximal arms and legs.  Exam does not disclose focal weakness or atrophy.  CK is normal, making myopathy unlikely. Symptoms are not consistent with neuromuscular junction disorder.  He is very sedentary and it's possible some of his symptoms may be from deconditioning.  To further evaluate, I will check NCS/EMG of the right arm and leg  2.  Right foot paresthesias ?radiculopathy vs neuropathy although I would expect the latter to be bilateral.  NCS/EMG will help localize symptoms.  Risk factor for neuropathy is alcohol history.   - Check vitamin B12, folate, vitamin B1  Further recommendations pending results.  ------------------------------------------------------------- History of present illness: For the past 10 years, he reports having weakness in the upper arms and legs which has been progressively worsening.  He has difficulty with standing up from the floor.  He has minimal difficulty getting out of a chair or climbing stairs.  He is able to perform ADLs without difficulty, but notices weakness mostly when exerting himself, such as if he was to lift 25lbs.  No neck or back pain.  CK was recently checked and normal. No associated difficulty with speech/swallow, double vision, or cramps.    He is retired IT trainer for [employer]  He lives alone in a  one level home.  No children.  Nonsmoker.  He has been sober since June 2023.  He was previously drinking half-gallon liquor daily x 5 years, and drinking moderately prior to that.    Out-side paper records, electronic medical record, and images have been reviewed where available and summarized as:  Lab Results  Component Value Date   HGBA1C 5.3 12/26/2019   Lab Results  Component Value Date   VITAMINB12 906 09/11/2020   Lab Results  Component Value Date   TSH 3.136 09/28/2021   No results found for: "ESRSEDRATE", "POCTSEDRATE"  Past Medical History:  Diagnosis Date   Alcoholism    Alcoholism    Aortic atherosclerosis 12/26/2016   Noted on CT   Arthritis    BPH (benign prostatic hyperplasia)    Chronic constipation    takes magnesium   Duodenal ulcer    Dyspnea 12/17/2017   ED (erectile dysfunction)    Foley catheter in place    history of no longer in place   GERD (gastroesophageal reflux disease)    Heart palpitations    Occ   Hiatal hernia    History of adenomatous polyp of colon    tubular adenoma's 2005   History of Helicobacter pylori infection    2008   Hypercholesteremia    Hypertension    MR (mitral regurgitation)    Mild, noted on ECHO   Pre-diabetes    Pulmonary nodule 12/26/2016   noted on CT, 5 x 3 mm nodular opacity right upper lobe   Rosacea    Schatzki's ring    last dilated  02/ 2016   Urinary retention    history of   Varicocele 02/25/2014   Small to moderate left varicocele    Past Surgical History:  Procedure Laterality Date   BALLOON DILATION N/A 06/08/2014   Procedure: BALLOON DILATION;  Surgeon: Charolett Bumpers, MD;  [facility]: [facility];  Service: Endoscopy;  Laterality: N/A;   COLONOSCOPY WITH PROPOFOL N/A 06/08/2014   Procedure: COLONOSCOPY WITH PROPOFOL;  Surgeon: Charolett Bumpers, MD;  [facility]: [facility];  Service: Endoscopy;  Laterality: N/A;   ESOPHAGOGASTRODUODENOSCOPY N/A 06/08/2014   Procedure:  ESOPHAGOGASTRODUODENOSCOPY (EGD);  Surgeon: Charolett Bumpers, MD;  Location: [facility];  Service: Endoscopy;  Laterality: N/A;   ESOPHAGOGASTRODUODENOSCOPY (EGD) WITH ESOPHAGEAL DILATION N/A 12/31/2012   Procedure: ESOPHAGOGASTRODUODENOSCOPY (EGD) WITH ESOPHAGEAL DILATION;  Surgeon: Charolett Bumpers, MD;  [facility];  Service: Endoscopy;  Laterality: N/A;   GREEN LIGHT LASER TURP (TRANSURETHRAL RESECTION OF PROSTATE N/A 12/28/2015   Procedure: GREEN LIGHT LASER TURP (TRANSURETHRAL RESECTION OF PROSTATE;  Surgeon: Jerilee Field, MD;  Location: [facility];  Service: Urology;  Laterality: N/A;   LEFT HEART CATH AND CORONARY ANGIOGRAPHY N/A 05/16/2022   Procedure: LEFT HEART CATH AND CORONARY ANGIOGRAPHY;  Surgeon: Elder Negus, MD;  Location: [facility];  Service: Cardiovascular;  Laterality: N/A;     Medications:  Outpatient Encounter Medications as of 07/25/2022  Medication Sig   buPROPion (WELLBUTRIN XL) 150 MG 24 hr tablet Take 150 mg by mouth every morning.   cloNIDine (CATAPRES) 0.1 MG tablet Take 0.1 mg by mouth at bedtime.   finasteride (PROSCAR) 5 MG tablet Take 5 mg by mouth daily.   gabapentin (NEURONTIN) 300 MG capsule Take 300 mg by mouth at bedtime.   hydrOXYzine (ATARAX) 50 MG tablet Take 50 mg by mouth at bedtime.   ibuprofen (ADVIL) 200 MG tablet Take 400 mg by mouth every 6 (six) hours as needed for moderate pain.   magnesium hydroxide (MILK OF MAGNESIA) 400 MG/5ML suspension Take 30 mLs by mouth every other day.   methocarbamol (ROBAXIN) 500 MG tablet Take 500 mg by mouth every 4 (four) hours as needed for muscle spasms.   metoprolol tartrate (LOPRESSOR) 50 MG tablet Take 50 mg by mouth 2 (two) times daily.   QUEtiapine Fumarate (SEROQUEL XR) 150 MG 24 hr tablet Take 150 mg by mouth at bedtime.   rosuvastatin (CRESTOR) 20 MG tablet Take 20 mg by mouth every Monday, Wednesday, and Friday.   senna-docusate (SENOKOT-S) 8.6-50 MG tablet  Take 1 tablet by mouth daily as needed for mild constipation.   tamsulosin (FLOMAX) 0.4 MG CAPS capsule Take 0.4 mg by mouth at bedtime.   No facility-administered encounter medications on file as of 07/25/2022.    Allergies: No Known Allergies  Family History: Family History  Problem Relation Age of Onset   Heart failure Mother    Breast cancer Mother    Stroke Father    Other Father        Colon Issues   Lung disease Neg Hx     Social History: Social History   Tobacco Use   Smoking status: Never   Smokeless tobacco: Never  Vaping Use   Vaping Use: Never used  Substance Use Topics   Alcohol use: Not Currently    Comment: 10/07/2021   Drug use: Not Currently    Types: Marijuana    Comment: 1980's   Social History   Social History Narrative   Right Handed  Lives in one story home. Lives alone     Vital Signs:  BP 118/68   Pulse 77   Ht  (1.803 m)   Wt 225 lb (102.1 kg)   SpO2 95%   BMI 31.38 kg/m    Neurological Exam: MENTAL STATUS including orientation to time, place, person, recent and remote memory, attention span and concentration, language, and fund of knowledge is normal.  Speech is not dysarthric.  CRANIAL NERVES: II:  No visual field defects.     III-IV-VI: Pupils equal round and reactive to light.  Normal conjugate, extra-ocular eye movements in all directions of gaze.  No nystagmus.  No ptosis.   V:  Normal facial sensation.    VII:  Normal facial symmetry and movements.   VIII:  Normal hearing and vestibular function.   IX-X:  Normal palatal movement.   XI:  Normal shoulder shrug and head rotation.   XII:  Normal tongue strength and range of motion, no deviation or fasciculation.  MOTOR:  No atrophy, fasciculations or abnormal movements.  No pronator drift.   Upper Extremity:  Right  Left  Deltoid  5/5   5/5   Biceps  5/5   5/5   Triceps  5/5   5/5   Wrist extensors  5/5   5/5   Wrist flexors  5/5   5/5   Finger extensors  5/5    5/5   Finger flexors  5/5   5/5   Dorsal interossei  5/5   5/5   Abductor pollicis  5/5   5/5   Tone ([place] scale)  0  0   Lower Extremity:  Right  Left  Hip flexors  5/5   5/5   Knee flexors  5/5   5/5   Knee extensors  5/5   5/5   Dorsiflexors  5/5   5/5   Plantarflexors  5/5   5/5   Toe extensors  5/5   5/5   Toe flexors  5/5   5/5   Tone ([place] scale)  0  0   MSRs:                                           Right        Left brachioradialis 2+  2+  biceps 2+  2+  triceps 2+  2+  patellar 2+  2+  ankle jerk 2+  2+  Hoffman no  no  plantar response down  down   SENSORY:  Reduced pin prick and vibration at the right great toe only. Otherwise, normal and symmetric perception of light touch, pinprick, vibration, and temperature.  Romberg's sign mildly positive.   COORDINATION/GAIT: Normal finger-to- nose-finger.  Intact rapid alternating movements bilaterally.  Able to rise from a chair without using arms.  Gait narrow based and stable. Stressed gait intact. Unable to perform tandem gait.    Thank you for allowing me to participate in patient's care.  If I can answer any additional questions, I would be pleased to do so.    Sincerely,    Teja Judice K. Allena Katz, DO

## 2022-07-25 NOTE — Patient Instructions (Signed)
Check labs  Nerve testing of the right arm and leg  ELECTROMYOGRAM AND NERVE CONDUCTION STUDIES (EMG/NCS) INSTRUCTIONS  How to Prepare The neurologist conducting the EMG will need to know if you have certain medical conditions. Tell the neurologist and other EMG lab personnel if you: Have a pacemaker or any other electrical medical device Take blood-thinning medications Have hemophilia, a blood-clotting disorder that causes prolonged bleeding Bathing Take a shower or bath shortly before your exam in order to remove oils from your skin. Don't apply lotions or creams before the exam.  What to Expect You'll likely be asked to change into a hospital gown for the procedure and lie down on an examination table. The following explanations can help you understand what will happen during the exam.  Electrodes. The neurologist or a technician places surface electrodes at various locations on your skin depending on where you're experiencing symptoms. Or the neurologist may insert needle electrodes at different sites depending on your symptoms.  Sensations. The electrodes will at times transmit a tiny electrical current that you may feel as a twinge or spasm. The needle electrode may cause discomfort or pain that usually ends shortly after the needle is removed. If you are concerned about discomfort or pain, you may want to talk to the neurologist about taking a short break during the exam.  Instructions. During the needle EMG, the neurologist will assess whether there is any spontaneous electrical activity when the muscle is at rest - activity that isn't present in healthy muscle tissue - and the degree of activity when you slightly contract the muscle.  He or she will give you instructions on resting and contracting a muscle at appropriate times. Depending on what muscles and nerves the neurologist is examining, he or she may ask you to change positions during the exam.  After your EMG You may experience  some temporary, minor bruising where the needle electrode was inserted into your muscle. This bruising should fade within several days. If it persists, contact your primary care doctor.   

## 2022-07-29 LAB — VITAMIN B1: Vitamin B1 (Thiamine): 13 nmol/L (ref 8–30)

## 2022-08-02 DIAGNOSIS — I10 Essential (primary) hypertension: Secondary | ICD-10-CM | POA: Diagnosis not present

## 2022-08-02 DIAGNOSIS — F5101 Primary insomnia: Secondary | ICD-10-CM | POA: Diagnosis not present

## 2022-08-02 DIAGNOSIS — F1021 Alcohol dependence, in remission: Secondary | ICD-10-CM | POA: Diagnosis not present

## 2022-08-02 DIAGNOSIS — F3341 Major depressive disorder, recurrent, in partial remission: Secondary | ICD-10-CM | POA: Diagnosis not present

## 2022-08-09 ENCOUNTER — Telehealth: Payer: Self-pay

## 2022-08-09 ENCOUNTER — Ambulatory Visit (INDEPENDENT_AMBULATORY_CARE_PROVIDER_SITE_OTHER): Payer: Medicare HMO

## 2022-08-09 DIAGNOSIS — R531 Weakness: Secondary | ICD-10-CM

## 2022-08-09 DIAGNOSIS — E538 Deficiency of other specified B group vitamins: Secondary | ICD-10-CM | POA: Diagnosis not present

## 2022-08-09 DIAGNOSIS — R202 Paresthesia of skin: Secondary | ICD-10-CM

## 2022-08-09 MED ORDER — CYANOCOBALAMIN 1000 MCG/ML IJ SOLN
1000.0000 ug | Freq: Once | INTRAMUSCULAR | Status: AC
  Administered 2022-08-09: 1000 ug via INTRAMUSCULAR

## 2022-08-09 NOTE — Telephone Encounter (Signed)
Dr. Allena Katz recommends Vitamin B12 IM injection daily x 7 days, weekly x 4 weeks, then monthly thereafter x 1 year.   Patient would like injection to be administered in our office.

## 2022-08-11 ENCOUNTER — Encounter: Payer: Self-pay | Admitting: Internal Medicine

## 2022-08-11 ENCOUNTER — Ambulatory Visit (INDEPENDENT_AMBULATORY_CARE_PROVIDER_SITE_OTHER): Payer: Medicare HMO

## 2022-08-11 ENCOUNTER — Ambulatory Visit: Payer: Medicare HMO | Admitting: Internal Medicine

## 2022-08-11 VITALS — BP 114/74 | HR 98 | Temp 98.0°F | Ht 71.0 in | Wt 225.0 lb

## 2022-08-11 DIAGNOSIS — R0602 Shortness of breath: Secondary | ICD-10-CM

## 2022-08-11 DIAGNOSIS — J9811 Atelectasis: Secondary | ICD-10-CM | POA: Diagnosis not present

## 2022-08-11 NOTE — Patient Instructions (Addendum)
Please schedule follow up scheduled with myself in 6 months.  If my schedule is not open yet, we will contact you with a reminder closer to that time. Please call [phone] if you haven't heard from [place] a month before.   Follow up with the neurologist regarding sleep study and the EMG. I will see you back sooner  Right now your chest xray and breathing testing is reassuring. I think your symptoms are most likely related to deconditioning. Start walking everyday even if its just for 15 minutes and work your way up. This will help your mood and breathing

## 2022-08-11 NOTE — Progress Notes (Signed)
Ruben [name]    [id number]    1950-09-06  Primary Care Physician:Brake, Resa Miner, FNP Date of Appointment: 08/11/2022 Established Patient Visit  Chief complaint:   Chief Complaint  Patient presents with   Follow-up    Discuss PFT from 06/30/2022     HPI: Ruben [name] is a 73 y.o. man, non smoker, who presents with shortness of breath and proximal muscle weakness x 15 years.  Interval Updates: Here for follow up. Has seen neurology. EMG and sleep study are pending.CK and aldolase reviewed - not elevated. Muscle weakness was felt to be subjective in nature. Continues to have ongoing palpitations with exertion. No chest tightness or wheezing.  Cardiology records extensively reviewed.  Neurology records reviewed.    I have reviewed the patient's family social and past medical history and updated as appropriate.   Past Medical History:  Diagnosis Date   Alcoholism (HCC)    Alcoholism (HCC)    Aortic atherosclerosis (HCC) 12/26/2016   Noted on CT   Arthritis    BPH (benign prostatic hyperplasia)    Chronic constipation    takes magnesium   Duodenal ulcer    Dyspnea 12/17/2017   ED (erectile dysfunction)    Foley catheter in place    history of no longer in place   GERD (gastroesophageal reflux disease)    Heart palpitations    Occ   Hiatal hernia    History of adenomatous polyp of colon    tubular adenoma's 2005   History of Helicobacter pylori infection    2008   Hypercholesteremia    Hypertension    MR (mitral regurgitation)    Mild, noted on ECHO   Pre-diabetes    Pulmonary nodule 12/26/2016   noted on CT, 5 x 3 mm nodular opacity right upper lobe   Rosacea    Schatzki's ring    last dilated 02/ 2016   Urinary retention    history of   Varicocele 02/25/2014   Small to moderate left varicocele    Past Surgical History:  Procedure Laterality Date   BALLOON DILATION N/A 06/08/2014   Procedure: BALLOON DILATION;  Surgeon: Charolett Bumpers,  MD;  Location: [facility];  Service: Endoscopy;  Laterality: N/A;   COLONOSCOPY WITH PROPOFOL N/A 06/08/2014   Procedure: COLONOSCOPY WITH PROPOFOL;  Surgeon: Charolett Bumpers, MD;  Location: [facility];  Service: Endoscopy;  Laterality: N/A;   ESOPHAGOGASTRODUODENOSCOPY N/A 06/08/2014   Procedure: ESOPHAGOGASTRODUODENOSCOPY (EGD);  Surgeon: Charolett Bumpers, MD;  Location: [facility];  Service: Endoscopy;  Laterality: N/A;   ESOPHAGOGASTRODUODENOSCOPY (EGD) WITH ESOPHAGEAL DILATION N/A 12/31/2012   Procedure: ESOPHAGOGASTRODUODENOSCOPY (EGD) WITH ESOPHAGEAL DILATION;  Surgeon: Charolett Bumpers, MD;  Location: [facility];  Service: Endoscopy;  Laterality: N/A;   GREEN LIGHT LASER TURP (TRANSURETHRAL RESECTION OF PROSTATE N/A 12/28/2015   Procedure: GREEN LIGHT LASER TURP (TRANSURETHRAL RESECTION OF PROSTATE;  Surgeon: Jerilee Field, MD;  Location: [facility];  Service: Urology;  Laterality: N/A;   LEFT HEART CATH AND CORONARY ANGIOGRAPHY N/A 05/16/2022   Procedure: LEFT HEART CATH AND CORONARY ANGIOGRAPHY;  Surgeon: Elder Negus, MD;  Location: [facility];  Service: Cardiovascular;  Laterality: N/A;    Family History  Problem Relation Age of Onset   Heart failure Mother    Breast cancer Mother    Stroke Father    Other Father        Colon Issues   Lung  disease Neg Hx     Social History   Occupational History   Not on file  Tobacco Use   Smoking status: Never   Smokeless tobacco: Never  Vaping Use   Vaping Use: Never used  Substance and Sexual Activity   Alcohol use: Not Currently    Comment: 10/07/2021   Drug use: Not Currently    Types: Marijuana    Comment: 1980's   Sexual activity: Not on file     Physical Exam: Blood pressure 114/74, pulse 98, temperature 98 F (36.7 C), temperature source Oral, height 5\' 11"  (1.803 m), weight 225 lb (102.1 kg), SpO2 98 %.  Gen:      No acute distress, obese Lungs:    CTAB no wheezes or  crackles CV:        RRR   Data Reviewed: Imaging: I have personally reviewed the CT cardiac scoring - visualized lung windows wnl  PFTs:     Latest Ref Rng & Units 06/30/2022    8:44 AM  PFT Results  FVC-Pre L 3.16   FVC-Predicted Pre % 68   Pre FEV1/FVC % % 77   FEV1-Pre L 2.44   FEV1-Predicted Pre % 72   DLCO uncorrected ml/min/mmHg 23.56   DLCO UNC% % 88   DLCO corrected ml/min/mmHg 22.50   DLCO COR %Predicted % 84   DLVA Predicted % 108   TLC L 4.75   TLC % Predicted % 65   RV % Predicted % 62    I have personally reviewed the patient's PFTs and mild restriction to ventilation  Labs:  Immunization status: Immunization History  Administered Date(s) Administered   Influenza Split 02/17/2009, 01/29/2012, 02/05/2014, 12/11/2014   Influenza,inj,Quad PF,6+ Mos 05/21/2020   Influenza-Unspecified 12/13/2020   Moderna Sars-Covid-2 Vaccination 03/15/2022   PFIZER(Purple Top)SARS-COV-2 Vaccination 07/10/2019, 07/31/2019, 05/21/2020   Pfizer Covid-19 Vaccine Bivalent Booster 68yrs & up 04/20/2021   Pneumococcal Polysaccharide-23 02/23/2014, 04/28/2018   Td (Adult) 05/19/2003   Tdap 10/21/2010    External Records Personally Reviewed: neurology  Assessment:  Dyspnea on exertion, most likely deconditioning.  Restriction to ventilation secondary to body habitus.  Subjective Muscle Weakness Concern for OSA  Plan/Recommendations: Chest xray obtained today reassuring. No diaphragmatic eventration. Low lung volumes.  Right now your chest xray and breathing testing is reassuring. I think your symptoms are most likely related to deconditioning. Start walking everyday even if its just for 15 minutes and work your way up. This will help your mood and breathing  Follow up with the neurologist regarding sleep study and the EMG.   Return to Care: Return in about 6 months (around 02/11/2023).   Durel Salts, MD Pulmonary and Critical Care Medicine [facility] Office:[phone]

## 2022-08-20 ENCOUNTER — Encounter (INDEPENDENT_AMBULATORY_CARE_PROVIDER_SITE_OTHER): Payer: Medicare HMO

## 2022-08-20 DIAGNOSIS — G4733 Obstructive sleep apnea (adult) (pediatric): Secondary | ICD-10-CM

## 2022-08-20 DIAGNOSIS — G4730 Sleep apnea, unspecified: Secondary | ICD-10-CM

## 2022-08-24 ENCOUNTER — Ambulatory Visit (INDEPENDENT_AMBULATORY_CARE_PROVIDER_SITE_OTHER): Payer: Medicare HMO | Admitting: Neurology

## 2022-08-24 DIAGNOSIS — R531 Weakness: Secondary | ICD-10-CM | POA: Diagnosis not present

## 2022-08-24 DIAGNOSIS — R202 Paresthesia of skin: Secondary | ICD-10-CM

## 2022-08-24 NOTE — Procedures (Signed)
[facility]  [address] [place], [address]  [place], [place] [address] Tel: [phone] Fax: [phone] Test Date:  08/24/2022  Patient: Ruben [name] DOB: December 26, 1950 Physician: Nita Sickle, DO  Sex: Male Height: 5\' 11"  Ref Phys: Nita Sickle, DO  ID#: [id number]   Technician:    History: This is a 72 year old man referred for evaluation of generalized weakness and left foot paresthesias.  NCV & EMG Findings: Extensive electrodiagnostic testing of the right upper and lower extremity shows:  Right median, ulnar, sural, and superficial peroneal sensory responses are within normal limits.   Right median motor response is within normal limits when accounted for the presence of a Martin-Gruber anastomosis, a normal anatomic variant.  Right ulnar, peroneal, and tibial motor responses are within normal limits. Right tibial H reflex study is within normal limits. There is no evidence of active or chronic motor axonal loss changes affecting any of the tested muscles.  Motor unit configuration and recruitment pattern is within normal limits.  Impression: This is a normal study of the right upper and lower extremities.  In particular, there is no evidence of a myopathy, cervical/lumbosacral radiculopathy, or large fiber sensorimotor polyneuropathy.   ___________________________ Nita Sickle, DO    Nerve Conduction Studies   Stim Site NR Peak (ms) Norm Peak (ms) O-P Amp (V) Norm O-P Amp  Right Median Anti Sensory (2nd Digit)  32 C  Wrist    3.2 <3.8 18.5 >10  Right Sup Peroneal Anti Sensory (Ant Lat Mall)  32 C  12 cm    3.3 <4.6 5.4 >3  Right Sural Anti Sensory (Lat Mall)  32 C  Calf    2.7 <4.6 9.3 >3  Right Ulnar Anti Sensory (5th Digit)  32 C  Wrist    2.8 <3.2 15.3 >5     Stim Site NR Onset (ms) Norm Onset (ms) O-P Amp (mV) Norm O-P Amp Site1 Site2 Delta-0 (ms) Dist (cm) Vel (m/s) Norm Vel (m/s)  Right Median Motor (Abd Poll Brev)  32 C  Wrist    3.2 <4.0 *4.3  >5 Elbow Wrist 4.5 28.0 62 >50  Elbow    7.7  4.1  Ulnar-wrist crossover Elbow 3.3 0.0    Ulnar-wrist crossover    4.4  4.1         Right Peroneal Motor (Ext Dig Brev)  32 C  Ankle    3.8 <6.0 3.0 >2.5 B Fib Ankle 8.2 36.0 44 >40  B Fib    12.0  2.5  Poplt B Fib 2.1 10.0 48 >40  Poplt    14.1  2.5         Right Peroneal TA Motor (Tib Ant)  32 C  Fib Head    2.8 <4.5 5.3 >3 Poplit Fib Head 2.0 10.0 50 >40  Poplit    4.8 <5.7 5.3         Right Tibial Motor (Abd Hall Brev)  32 C  Ankle    3.8 <6.0 9.5 >4 Knee Ankle 9.5 45.0 47 >40  Knee    13.3  6.0         Right Ulnar Motor (Abd Dig Minimi)  32 C  Wrist    2.8 <3.1 8.0 >7 B Elbow Wrist 4.0 22.0 55 >50  B Elbow    6.8  7.9  A Elbow B Elbow 1.6 10.0 62 >50  A Elbow    8.4  7.8          Electromyography  Side Muscle Ins.Act Fibs Fasc Recrt Amp Dur Poly Activation Comment  Right 1stDorInt Nml Nml Nml Nml Nml Nml Nml Nml N/A  Right PronatorTeres Nml Nml Nml Nml Nml Nml Nml Nml N/A  Right Biceps Nml Nml Nml Nml Nml Nml Nml Nml N/A  Right Triceps Nml Nml Nml Nml Nml Nml Nml Nml N/A  Right Deltoid Nml Nml Nml Nml Nml Nml Nml Nml N/A  Right AntTibialis Nml Nml Nml Nml Nml Nml Nml Nml N/A  Right Gastroc Nml Nml Nml Nml Nml Nml Nml Nml N/A  Right Flex Dig Long Nml Nml Nml Nml Nml Nml Nml Nml N/A  Right RectFemoris Nml Nml Nml Nml Nml Nml Nml Nml N/A  Right GluteusMed Nml Nml Nml Nml Nml Nml Nml Nml N/A      Waveforms:

## 2022-08-25 ENCOUNTER — Encounter: Payer: Self-pay | Admitting: Internal Medicine

## 2022-08-25 ENCOUNTER — Other Ambulatory Visit: Payer: Self-pay

## 2022-08-25 ENCOUNTER — Ambulatory Visit: Payer: Medicare HMO | Admitting: Internal Medicine

## 2022-08-25 VITALS — BP 120/80 | HR 73 | Temp 98.3°F | Ht 71.0 in | Wt 228.8 lb

## 2022-08-25 DIAGNOSIS — G4733 Obstructive sleep apnea (adult) (pediatric): Secondary | ICD-10-CM | POA: Diagnosis not present

## 2022-08-25 DIAGNOSIS — G4730 Sleep apnea, unspecified: Secondary | ICD-10-CM

## 2022-08-25 NOTE — Progress Notes (Signed)
Ruben [name]    [id number]    [birth year]  Primary Care Physician:Brake, Resa Miner, FNP Date of Appointment: 08/25/2022 Established Patient Visit  Chief complaint:   Chief Complaint  Patient presents with   Follow-up    Pt is here today to discuss results from recent sleep study.  Pt states he still becomes SOB with exertion.     HPI: Ruben [name] is a 72 y.o. man, non smoker, who presents with shortness of breath and proximal muscle weakness x 15 years.  Interval Updates: Here for follow up after sleep study. Had normal EMG with neurology.    I have reviewed the patient's family social and past medical history and updated as appropriate.   Past Medical History:  Diagnosis Date   Alcoholism (HCC)    Alcoholism (HCC)    Aortic atherosclerosis (HCC) 12/26/2016   Noted on CT   Arthritis    BPH (benign prostatic hyperplasia)    Chronic constipation    takes magnesium   Duodenal ulcer    Dyspnea 12/17/2017   ED (erectile dysfunction)    Foley catheter in place    history of no longer in place   GERD (gastroesophageal reflux disease)    Heart palpitations    Occ   Hiatal hernia    History of adenomatous polyp of colon    tubular adenoma's 2005   History of Helicobacter pylori infection    2008   Hypercholesteremia    Hypertension    MR (mitral regurgitation)    Mild, noted on ECHO   Pre-diabetes    Pulmonary nodule 12/26/2016   noted on CT, 5 x 3 mm nodular opacity right upper lobe   Rosacea    Schatzki's ring    last dilated 02/ 2016   Urinary retention    history of   Varicocele 02/25/2014   Small to moderate left varicocele    Past Surgical History:  Procedure Laterality Date   BALLOON DILATION N/A 06/08/2014   Procedure: BALLOON DILATION;  Surgeon: Charolett Bumpers, MD;  Location: [facility];  Service: Endoscopy;  Laterality: N/A;   COLONOSCOPY WITH PROPOFOL N/A 06/08/2014   Procedure: COLONOSCOPY WITH PROPOFOL;  Surgeon: Charolett Bumpers, MD;  Location: [facility];  Service: Endoscopy;  Laterality: N/A;   ESOPHAGOGASTRODUODENOSCOPY N/A 06/08/2014   Procedure: ESOPHAGOGASTRODUODENOSCOPY (EGD);  Surgeon: Charolett Bumpers, MD;  Location: [facility];  Service: Endoscopy;  Laterality: N/A;   ESOPHAGOGASTRODUODENOSCOPY (EGD) WITH ESOPHAGEAL DILATION N/A 12/31/2012   Procedure: ESOPHAGOGASTRODUODENOSCOPY (EGD) WITH ESOPHAGEAL DILATION;  Surgeon: Charolett Bumpers, MD;  Location: [facility];  Service: Endoscopy;  Laterality: N/A;   GREEN LIGHT LASER TURP (TRANSURETHRAL RESECTION OF PROSTATE N/A 12/28/2015   Procedure: GREEN LIGHT LASER TURP (TRANSURETHRAL RESECTION OF PROSTATE;  Surgeon: Jerilee Field, MD;  Location: [facility];  Service: Urology;  Laterality: N/A;   LEFT HEART CATH AND CORONARY ANGIOGRAPHY N/A 05/16/2022   Procedure: LEFT HEART CATH AND CORONARY ANGIOGRAPHY;  Surgeon: Elder Negus, MD;  Location: [facility];  Service: Cardiovascular;  Laterality: N/A;    Family History  Problem Relation Age of Onset   Heart failure Mother    Breast cancer Mother    Stroke Father    Other Father        Colon Issues   Lung disease Neg Hx     Social History   Occupational History   Not on file  Tobacco Use  Smoking status: Never   Smokeless tobacco: Never  Vaping Use   Vaping Use: Never used  Substance and Sexual Activity   Alcohol use: Not Currently    Comment: 10/07/2021   Drug use: Not Currently    Types: Marijuana    Comment: 1980's   Sexual activity: Not on file     Physical Exam: Blood pressure 120/80, pulse 73, temperature 98.3 F (36.8 C), temperature source Oral, height 5\' 11"  (1.803 m), weight 228 lb 12.8 oz (103.8 kg), SpO2 98 %.  Gen:      No distress, abdominal obesity. Lungs:    ctab no wheeze CV:        RRR   Data Reviewed: Imaging: I have personally reviewed the CT cardiac scoring - visualized lung windows wnl  Sleep study reviewed - AHI >21/hour.  Desaturation down to 79%.   PFTs:     Latest Ref Rng & Units 06/30/2022    8:44 AM  PFT Results  FVC-Pre L 3.16   FVC-Predicted Pre % 68   Pre FEV1/FVC % % 77   FEV1-Pre L 2.44   FEV1-Predicted Pre % 72   DLCO uncorrected ml/min/mmHg 23.56   DLCO UNC% % 88   DLCO corrected ml/min/mmHg 22.50   DLCO COR %Predicted % 84   DLVA Predicted % 108   TLC L 4.75   TLC % Predicted % 65   RV % Predicted % 62    I have personally reviewed the patient's PFTs and mild restriction to ventilation  Labs: Lab Results  Component Value Date   WBC 7.4 05/12/2022   HGB 16.4 05/12/2022   HCT 48.6 05/12/2022   MCV 84 05/12/2022   PLT 219 05/12/2022   Lab Results  Component Value Date   NA 136 05/16/2022   K 3.9 05/16/2022   CL 104 05/16/2022   CO2 22 05/16/2022     Immunization status: Immunization History  Administered Date(s) Administered   Influenza Split 02/17/2009, 01/29/2012, 02/05/2014, 12/11/2014   Influenza, High Dose Seasonal PF 01/21/2019, 01/18/2022   Influenza,inj,Quad PF,6+ Mos 05/21/2020   Influenza-Unspecified 12/13/2020   Moderna Sars-Covid-2 Vaccination 03/15/2022   PFIZER(Purple Top)SARS-COV-2 Vaccination 07/10/2019, 07/31/2019, 05/21/2020   Pfizer Covid-19 Vaccine Bivalent Booster 55yrs & up 04/20/2021   Pneumococcal Polysaccharide-23 02/23/2014, 04/28/2018   Td (Adult) 05/19/2003   Tdap 10/21/2010    External Records Personally Reviewed: neurology  Assessment:  Dyspnea on exertion, most likely deconditioning.  Restriction to ventilation secondary to body habitus.  Severe OSA  Plan/Recommendations: Sleep study consistent with OSA. New prescription for CPAP sent in today.   Start using CPAP machine. Follow up with me within 31-90 days of using machine to demonstrate efficacy.   Return to Care: Return in about 3 months (around 11/25/2022).   Durel Salts, MD Pulmonary and Critical Care Medicine [facility] Office:[phone]

## 2022-08-25 NOTE — Patient Instructions (Signed)
Please schedule follow up scheduled with myself in 3 months.  If my schedule is not open yet, we will contact you with a reminder closer to that time. Please call [phone] if you haven't heard from [place] a month before.   Start using CPAP machine. Follow up with me within 31-90 days of using machine to demonstrate efficacy.

## 2022-08-31 DIAGNOSIS — D3131 Benign neoplasm of right choroid: Secondary | ICD-10-CM | POA: Diagnosis not present

## 2022-09-01 DIAGNOSIS — I10 Essential (primary) hypertension: Secondary | ICD-10-CM | POA: Diagnosis not present

## 2022-09-01 DIAGNOSIS — D696 Thrombocytopenia, unspecified: Secondary | ICD-10-CM | POA: Diagnosis not present

## 2022-09-01 DIAGNOSIS — F5101 Primary insomnia: Secondary | ICD-10-CM | POA: Diagnosis not present

## 2022-09-01 DIAGNOSIS — F1021 Alcohol dependence, in remission: Secondary | ICD-10-CM | POA: Diagnosis not present

## 2022-11-15 DIAGNOSIS — N401 Enlarged prostate with lower urinary tract symptoms: Secondary | ICD-10-CM | POA: Diagnosis not present

## 2022-11-15 DIAGNOSIS — R3914 Feeling of incomplete bladder emptying: Secondary | ICD-10-CM | POA: Diagnosis not present

## 2022-11-15 DIAGNOSIS — R972 Elevated prostate specific antigen [PSA]: Secondary | ICD-10-CM | POA: Diagnosis not present

## 2022-12-07 DIAGNOSIS — R7303 Prediabetes: Secondary | ICD-10-CM | POA: Diagnosis not present

## 2022-12-07 DIAGNOSIS — I10 Essential (primary) hypertension: Secondary | ICD-10-CM | POA: Diagnosis not present

## 2022-12-07 DIAGNOSIS — F1021 Alcohol dependence, in remission: Secondary | ICD-10-CM | POA: Diagnosis not present

## 2022-12-07 DIAGNOSIS — K219 Gastro-esophageal reflux disease without esophagitis: Secondary | ICD-10-CM | POA: Diagnosis not present

## 2022-12-07 DIAGNOSIS — R1310 Dysphagia, unspecified: Secondary | ICD-10-CM | POA: Diagnosis not present

## 2022-12-07 DIAGNOSIS — E782 Mixed hyperlipidemia: Secondary | ICD-10-CM | POA: Diagnosis not present

## 2022-12-07 DIAGNOSIS — F5101 Primary insomnia: Secondary | ICD-10-CM | POA: Diagnosis not present

## 2022-12-07 DIAGNOSIS — Z0000 Encounter for general adult medical examination without abnormal findings: Secondary | ICD-10-CM | POA: Diagnosis not present

## 2022-12-07 DIAGNOSIS — N400 Enlarged prostate without lower urinary tract symptoms: Secondary | ICD-10-CM | POA: Diagnosis not present

## 2022-12-15 DIAGNOSIS — R0609 Other forms of dyspnea: Secondary | ICD-10-CM | POA: Diagnosis not present

## 2022-12-15 DIAGNOSIS — I10 Essential (primary) hypertension: Secondary | ICD-10-CM | POA: Diagnosis not present

## 2022-12-15 DIAGNOSIS — R7303 Prediabetes: Secondary | ICD-10-CM | POA: Diagnosis not present

## 2022-12-15 DIAGNOSIS — E782 Mixed hyperlipidemia: Secondary | ICD-10-CM | POA: Diagnosis not present

## 2022-12-15 DIAGNOSIS — Z0000 Encounter for general adult medical examination without abnormal findings: Secondary | ICD-10-CM | POA: Diagnosis not present

## 2022-12-15 DIAGNOSIS — F1021 Alcohol dependence, in remission: Secondary | ICD-10-CM | POA: Diagnosis not present

## 2022-12-17 ENCOUNTER — Emergency Department (HOSPITAL_COMMUNITY): Payer: Medicare HMO

## 2022-12-17 ENCOUNTER — Encounter (HOSPITAL_COMMUNITY): Payer: Self-pay

## 2022-12-17 ENCOUNTER — Other Ambulatory Visit: Payer: Self-pay

## 2022-12-17 ENCOUNTER — Inpatient Hospital Stay (HOSPITAL_COMMUNITY)
Admission: EM | Admit: 2022-12-17 | Discharge: 2022-12-19 | DRG: 640 | Disposition: A | Discharge status: Home / Self Care | Payer: Medicare HMO | Attending: Internal Medicine | Admitting: Internal Medicine

## 2022-12-17 DIAGNOSIS — R9431 Abnormal electrocardiogram [ECG] [EKG]: Secondary | ICD-10-CM | POA: Diagnosis present

## 2022-12-17 DIAGNOSIS — E669 Obesity, unspecified: Secondary | ICD-10-CM | POA: Diagnosis present

## 2022-12-17 DIAGNOSIS — K5730 Diverticulosis of large intestine without perforation or abscess without bleeding: Secondary | ICD-10-CM | POA: Diagnosis not present

## 2022-12-17 DIAGNOSIS — N3289 Other specified disorders of bladder: Secondary | ICD-10-CM | POA: Diagnosis present

## 2022-12-17 DIAGNOSIS — Y905 Blood alcohol level of 100-119 mg/100 ml: Secondary | ICD-10-CM | POA: Diagnosis present

## 2022-12-17 DIAGNOSIS — F10229 Alcohol dependence with intoxication, unspecified: Secondary | ICD-10-CM | POA: Diagnosis not present

## 2022-12-17 DIAGNOSIS — F10239 Alcohol dependence with withdrawal, unspecified: Principal | ICD-10-CM | POA: Diagnosis present

## 2022-12-17 DIAGNOSIS — D751 Secondary polycythemia: Secondary | ICD-10-CM | POA: Diagnosis present

## 2022-12-17 DIAGNOSIS — J9811 Atelectasis: Secondary | ICD-10-CM | POA: Diagnosis present

## 2022-12-17 DIAGNOSIS — E872 Acidosis, unspecified: Secondary | ICD-10-CM | POA: Diagnosis not present

## 2022-12-17 DIAGNOSIS — R1111 Vomiting without nausea: Secondary | ICD-10-CM | POA: Diagnosis not present

## 2022-12-17 DIAGNOSIS — T507X6A Underdosing of analeptics and opioid receptor antagonists, initial encounter: Secondary | ICD-10-CM | POA: Diagnosis present

## 2022-12-17 DIAGNOSIS — G9341 Metabolic encephalopathy: Secondary | ICD-10-CM | POA: Diagnosis not present

## 2022-12-17 DIAGNOSIS — R112 Nausea with vomiting, unspecified: Secondary | ICD-10-CM | POA: Diagnosis present

## 2022-12-17 DIAGNOSIS — N401 Enlarged prostate with lower urinary tract symptoms: Secondary | ICD-10-CM | POA: Diagnosis present

## 2022-12-17 DIAGNOSIS — A419 Sepsis, unspecified organism: Secondary | ICD-10-CM | POA: Diagnosis not present

## 2022-12-17 DIAGNOSIS — Z91138 Patient's unintentional underdosing of medication regimen for other reason: Secondary | ICD-10-CM

## 2022-12-17 DIAGNOSIS — M6281 Muscle weakness (generalized): Secondary | ICD-10-CM | POA: Diagnosis present

## 2022-12-17 DIAGNOSIS — R519 Headache, unspecified: Secondary | ICD-10-CM | POA: Diagnosis not present

## 2022-12-17 DIAGNOSIS — R338 Other retention of urine: Secondary | ICD-10-CM | POA: Diagnosis present

## 2022-12-17 DIAGNOSIS — E8721 Acute metabolic acidosis: Secondary | ICD-10-CM | POA: Diagnosis not present

## 2022-12-17 DIAGNOSIS — K8689 Other specified diseases of pancreas: Secondary | ICD-10-CM | POA: Diagnosis not present

## 2022-12-17 DIAGNOSIS — G4700 Insomnia, unspecified: Secondary | ICD-10-CM | POA: Diagnosis present

## 2022-12-17 DIAGNOSIS — F32A Depression, unspecified: Secondary | ICD-10-CM | POA: Diagnosis present

## 2022-12-17 DIAGNOSIS — D72828 Other elevated white blood cell count: Secondary | ICD-10-CM | POA: Diagnosis present

## 2022-12-17 DIAGNOSIS — Z6832 Body mass index (BMI) 32.0-32.9, adult: Secondary | ICD-10-CM

## 2022-12-17 DIAGNOSIS — R000 Tachycardia, unspecified: Secondary | ICD-10-CM | POA: Diagnosis not present

## 2022-12-17 DIAGNOSIS — E860 Dehydration: Secondary | ICD-10-CM | POA: Diagnosis present

## 2022-12-17 DIAGNOSIS — E875 Hyperkalemia: Secondary | ICD-10-CM | POA: Diagnosis present

## 2022-12-17 DIAGNOSIS — R0989 Other specified symptoms and signs involving the circulatory and respiratory systems: Secondary | ICD-10-CM | POA: Diagnosis not present

## 2022-12-17 DIAGNOSIS — R6520 Severe sepsis without septic shock: Secondary | ICD-10-CM | POA: Diagnosis not present

## 2022-12-17 DIAGNOSIS — G4733 Obstructive sleep apnea (adult) (pediatric): Secondary | ICD-10-CM | POA: Diagnosis present

## 2022-12-17 DIAGNOSIS — N179 Acute kidney failure, unspecified: Secondary | ICD-10-CM | POA: Diagnosis present

## 2022-12-17 DIAGNOSIS — Z79899 Other long term (current) drug therapy: Secondary | ICD-10-CM

## 2022-12-17 DIAGNOSIS — I10 Essential (primary) hypertension: Secondary | ICD-10-CM | POA: Diagnosis not present

## 2022-12-17 DIAGNOSIS — Z823 Family history of stroke: Secondary | ICD-10-CM

## 2022-12-17 DIAGNOSIS — Z803 Family history of malignant neoplasm of breast: Secondary | ICD-10-CM

## 2022-12-17 DIAGNOSIS — E7800 Pure hypercholesterolemia, unspecified: Secondary | ICD-10-CM | POA: Diagnosis present

## 2022-12-17 DIAGNOSIS — G9340 Encephalopathy, unspecified: Secondary | ICD-10-CM

## 2022-12-17 DIAGNOSIS — Z8711 Personal history of peptic ulcer disease: Secondary | ICD-10-CM

## 2022-12-17 DIAGNOSIS — Z8249 Family history of ischemic heart disease and other diseases of the circulatory system: Secondary | ICD-10-CM

## 2022-12-17 LAB — BASIC METABOLIC PANEL
Anion gap: 14 (ref 5–15)
BUN: 21 mg/dL (ref 8–23)
CO2: 19 mmol/L — ABNORMAL LOW (ref 22–32)
Calcium: 7.5 mg/dL — ABNORMAL LOW (ref 8.9–10.3)
Chloride: 103 mmol/L (ref 98–111)
Creatinine, Ser: 1.59 mg/dL — ABNORMAL HIGH (ref 0.61–1.24)
GFR, Estimated: 46 mL/min — ABNORMAL LOW (ref >60)
Glucose, Bld: 154 mg/dL — ABNORMAL HIGH (ref 70–99)
Potassium: 4 mmol/L (ref 3.5–5.1)
Sodium: 136 mmol/L (ref 135–145)

## 2022-12-17 LAB — CBC WITH DIFFERENTIAL/PLATELET
Abs Immature Granulocytes: 0 10*3/uL (ref 0.00–0.07)
Basophils Absolute: 0.7 10*3/uL — ABNORMAL HIGH (ref 0.0–0.1)
Basophils Relative: 3 %
Eosinophils Absolute: 0 10*3/uL (ref 0.0–0.5)
Eosinophils Relative: 0 %
HCT: 54.9 % — ABNORMAL HIGH (ref 39.0–52.0)
Hemoglobin: 17.2 g/dL — ABNORMAL HIGH (ref 13.0–17.0)
Lymphocytes Relative: 9 %
Lymphs Abs: 2 10*3/uL (ref 0.7–4.0)
MCH: 28.3 pg (ref 26.0–34.0)
MCHC: 31.3 g/dL (ref 30.0–36.0)
MCV: 90.4 fL (ref 80.0–100.0)
Monocytes Absolute: 1.3 10*3/uL — ABNORMAL HIGH (ref 0.1–1.0)
Monocytes Relative: 6 %
Neutro Abs: 18.1 10*3/uL — ABNORMAL HIGH (ref 1.7–7.7)
Neutrophils Relative %: 82 %
Platelets: 334 10*3/uL (ref 150–400)
RBC: 6.07 MIL/uL — ABNORMAL HIGH (ref 4.22–5.81)
RDW: 14.5 % (ref 11.5–15.5)
WBC: 22.1 10*3/uL — ABNORMAL HIGH (ref 4.0–10.5)
nRBC: 0 /100{WBCs}
nRBC: 0.1 % (ref 0.0–0.2)

## 2022-12-17 LAB — I-STAT VENOUS BLOOD GAS, ED
Acid-base deficit: 25 mmol/L — ABNORMAL HIGH (ref 0.0–2.0)
Bicarbonate: 5.5 mmol/L — ABNORMAL LOW (ref 20.0–28.0)
Calcium, Ion: 0.98 mmol/L — ABNORMAL LOW (ref 1.15–1.40)
HCT: 49 % (ref 39.0–52.0)
Hemoglobin: 16.7 g/dL (ref 13.0–17.0)
O2 Saturation: 93 %
Potassium: 5.8 mmol/L — ABNORMAL HIGH (ref 3.5–5.1)
Sodium: 134 mmol/L — ABNORMAL LOW (ref 135–145)
TCO2: 6 mmol/L — ABNORMAL LOW (ref 22–32)
pCO2, Ven: 23.9 mmHg — ABNORMAL LOW (ref 44–60)
pH, Ven: 6.972 — CL (ref 7.25–7.43)
pO2, Ven: 99 mmHg — ABNORMAL HIGH (ref 32–45)

## 2022-12-17 LAB — RAPID URINE DRUG SCREEN, HOSP PERFORMED
Amphetamines: NOT DETECTED
Barbiturates: NOT DETECTED
Benzodiazepines: NOT DETECTED
Cocaine: NOT DETECTED
Opiates: NOT DETECTED
Tetrahydrocannabinol: NOT DETECTED

## 2022-12-17 LAB — GLUCOSE, CAPILLARY
Glucose-Capillary: 165 mg/dL — ABNORMAL HIGH (ref 70–99)
Glucose-Capillary: 161 mg/dL — ABNORMAL HIGH (ref 70–99)
Glucose-Capillary: 152 mg/dL — ABNORMAL HIGH (ref 70–99)

## 2022-12-17 LAB — COMPREHENSIVE METABOLIC PANEL
ALT: 32 U/L (ref 0–44)
ALT: 27 U/L (ref 0–44)
AST: 35 U/L (ref 15–41)
AST: 31 U/L (ref 15–41)
Albumin: 4.1 g/dL (ref 3.5–5.0)
Albumin: 3.5 g/dL (ref 3.5–5.0)
Alkaline Phosphatase: 70 U/L (ref 38–126)
Alkaline Phosphatase: 60 U/L (ref 38–126)
Anion gap: 20 — ABNORMAL HIGH (ref 5–15)
BUN: 21 mg/dL (ref 8–23)
BUN: 21 mg/dL (ref 8–23)
CO2: <7 mmol/L — ABNORMAL LOW (ref 22–32)
CO2: 13 mmol/L — ABNORMAL LOW (ref 22–32)
Calcium: 8.4 mg/dL — ABNORMAL LOW (ref 8.9–10.3)
Calcium: 8 mg/dL — ABNORMAL LOW (ref 8.9–10.3)
Chloride: 100 mmol/L (ref 98–111)
Chloride: 102 mmol/L (ref 98–111)
Creatinine, Ser: 1.99 mg/dL — ABNORMAL HIGH (ref 0.61–1.24)
Creatinine, Ser: 1.66 mg/dL — ABNORMAL HIGH (ref 0.61–1.24)
GFR, Estimated: 35 mL/min — ABNORMAL LOW (ref >60)
GFR, Estimated: 44 mL/min — ABNORMAL LOW (ref >60)
Glucose, Bld: 126 mg/dL — ABNORMAL HIGH (ref 70–99)
Glucose, Bld: 174 mg/dL — ABNORMAL HIGH (ref 70–99)
Potassium: 5.5 mmol/L — ABNORMAL HIGH (ref 3.5–5.1)
Potassium: 5 mmol/L (ref 3.5–5.1)
Sodium: 136 mmol/L (ref 135–145)
Sodium: 135 mmol/L (ref 135–145)
Total Bilirubin: 0.7 mg/dL (ref 0.3–1.2)
Total Bilirubin: 1.5 mg/dL — ABNORMAL HIGH (ref 0.3–1.2)
Total Protein: 7.6 g/dL (ref 6.5–8.1)
Total Protein: 6.6 g/dL (ref 6.5–8.1)

## 2022-12-17 LAB — URINALYSIS, ROUTINE W REFLEX MICROSCOPIC
Bacteria, UA: NONE SEEN
Bilirubin Urine: NEGATIVE
Glucose, UA: NEGATIVE mg/dL
Ketones, ur: 20 mg/dL — AB
Leukocytes,Ua: NEGATIVE
Nitrite: NEGATIVE
Protein, ur: 30 mg/dL — AB
Specific Gravity, Urine: 1.014 (ref 1.005–1.030)
pH: 5 (ref 5.0–8.0)

## 2022-12-17 LAB — LIPASE, BLOOD: Lipase: 23 U/L (ref 11–51)

## 2022-12-17 LAB — ACETAMINOPHEN LEVEL: Acetaminophen (Tylenol), Serum: <10 ug/mL — ABNORMAL LOW (ref 10–30)

## 2022-12-17 LAB — ETHANOL: Alcohol, Ethyl (B): 101 mg/dL — ABNORMAL HIGH (ref <10)

## 2022-12-17 LAB — VOLATILES,BLD-ACETONE,ETHANOL,ISOPROP,METHANOL
Acetone, blood: <0.01 g/dL (ref 0.000–0.010)
Ethanol, blood: 0.045 g/dL — ABNORMAL HIGH (ref 0.000–0.010)
Isopropanol, blood: <0.01 g/dL (ref 0.000–0.010)
Methanol, blood: <0.01 g/dL (ref 0.000–0.010)

## 2022-12-17 LAB — LACTIC ACID, PLASMA
Lactic Acid, Venous: >9 mmol/L (ref 0.5–1.9)
Lactic Acid, Venous: 4.3 mmol/L (ref 0.5–1.9)

## 2022-12-17 LAB — MRSA NEXT GEN BY PCR, NASAL: MRSA by PCR Next Gen: NOT DETECTED

## 2022-12-17 LAB — I-STAT CG4 LACTIC ACID, ED: Lactic Acid, Venous: >15 mmol/L (ref 0.5–1.9)

## 2022-12-17 LAB — PROTIME-INR
INR: 1.2 (ref 0.8–1.2)
Prothrombin Time: 15.7 s — ABNORMAL HIGH (ref 11.4–15.2)

## 2022-12-17 LAB — OSMOLALITY, URINE: Osmolality, Ur: 513 mosm/kg (ref 300–900)

## 2022-12-17 LAB — MAGNESIUM
Magnesium: 2.3 mg/dL (ref 1.7–2.4)
Magnesium: 2 mg/dL (ref 1.7–2.4)

## 2022-12-17 LAB — PROCALCITONIN: Procalcitonin: <0.1 ng/mL

## 2022-12-17 LAB — SALICYLATE LEVEL: Salicylate Lvl: <7 mg/dL — ABNORMAL LOW (ref 7.0–30.0)

## 2022-12-17 LAB — OSMOLALITY: Osmolality: 328 mosm/kg (ref 275–295)

## 2022-12-17 MED ORDER — SODIUM BICARBONATE 8.4 % IV SOLN
INTRAVENOUS | Status: AC
  Filled 2022-12-17: qty 50

## 2022-12-17 MED ORDER — LORAZEPAM 2 MG/ML IJ SOLN
0.0000 mg | Freq: Four times a day (QID) | INTRAMUSCULAR | Status: DC
  Administered 2022-12-17: 2 mg via INTRAVENOUS
  Filled 2022-12-17: qty 1

## 2022-12-17 MED ORDER — DEXMEDETOMIDINE HCL IN NACL 400 MCG/100ML IV SOLN: 0.0000 ug/kg/h | INTRAVENOUS | Status: DC

## 2022-12-17 MED ORDER — LORAZEPAM 2 MG/ML IJ SOLN
1.0000 mg | Freq: Once | INTRAMUSCULAR | Status: AC
  Administered 2022-12-17: 1 mg via INTRAVENOUS

## 2022-12-17 MED ORDER — ONDANSETRON HCL 4 MG/2ML IJ SOLN
4.0000 mg | Freq: Four times a day (QID) | INTRAMUSCULAR | Status: DC | PRN
  Administered 2022-12-17: 4 mg via INTRAVENOUS
  Filled 2022-12-17: qty 2

## 2022-12-17 MED ORDER — LACTATED RINGERS IV BOLUS
1000.0000 mL | Freq: Once | INTRAVENOUS | Status: AC
  Administered 2022-12-17: 1000 mL via INTRAVENOUS

## 2022-12-17 MED ORDER — FOLIC ACID 1 MG PO TABS
1.0000 mg | ORAL_TABLET | Freq: Every day | ORAL | Status: DC
  Administered 2022-12-18 – 2022-12-19 (×2): 1 mg via ORAL
  Filled 2022-12-17 (×2): qty 1

## 2022-12-17 MED ORDER — LORAZEPAM 1 MG PO TABS: 0.0000 mg | ORAL_TABLET | Freq: Four times a day (QID) | ORAL | Status: DC

## 2022-12-17 MED ORDER — ORAL CARE MOUTH RINSE: 15.0000 mL | OROMUCOSAL | Status: DC | PRN

## 2022-12-17 MED ORDER — THIAMINE HCL 100 MG/ML IJ SOLN
100.0000 mg | Freq: Every day | INTRAMUSCULAR | Status: DC
  Administered 2022-12-17: 100 mg via INTRAVENOUS
  Filled 2022-12-17: qty 2

## 2022-12-17 MED ORDER — ADULT MULTIVITAMIN W/MINERALS CH
1.0000 | ORAL_TABLET | Freq: Every day | ORAL | Status: DC
  Administered 2022-12-18 – 2022-12-19 (×2): 1 via ORAL
  Filled 2022-12-17 (×2): qty 1

## 2022-12-17 MED ORDER — SODIUM BICARBONATE 8.4 % IV SOLN
INTRAVENOUS | Status: DC
  Filled 2022-12-17 (×3): qty 1000

## 2022-12-17 MED ORDER — CALCIUM GLUCONATE-NACL 1-0.675 GM/50ML-% IV SOLN
1.0000 g | Freq: Once | INTRAVENOUS | Status: AC
  Administered 2022-12-17: 1000 mg via INTRAVENOUS
  Filled 2022-12-17: qty 50

## 2022-12-17 MED ORDER — SODIUM BICARBONATE 8.4 % IV SOLN
50.0000 meq | Freq: Once | INTRAVENOUS | Status: AC
  Administered 2022-12-17: 50 meq via INTRAVENOUS
  Filled 2022-12-17: qty 50

## 2022-12-17 MED ORDER — PIPERACILLIN-TAZOBACTAM 3.375 G IVPB 30 MIN
3.3750 g | Freq: Once | INTRAVENOUS | Status: AC
  Administered 2022-12-17: 3.375 g via INTRAVENOUS
  Filled 2022-12-17: qty 50

## 2022-12-17 MED ORDER — PROCHLORPERAZINE MALEATE 5 MG PO TABS: 5.0000 mg | ORAL_TABLET | Freq: Four times a day (QID) | ORAL | Status: DC | PRN

## 2022-12-17 MED ORDER — SODIUM BICARBONATE 8.4 % IV SOLN
50.0000 meq | Freq: Once | INTRAVENOUS | Status: AC
  Administered 2022-12-17: 50 meq via INTRAVENOUS

## 2022-12-17 MED ORDER — HYDROXYZINE HCL 25 MG PO TABS
50.0000 mg | ORAL_TABLET | Freq: Every day | ORAL | Status: DC
  Administered 2022-12-17 – 2022-12-18 (×2): 50 mg via ORAL
  Filled 2022-12-17 (×2): qty 2

## 2022-12-17 MED ORDER — POLYETHYLENE GLYCOL 3350 17 G PO PACK: 17.0000 g | PACK | Freq: Every day | ORAL | Status: DC | PRN

## 2022-12-17 MED ORDER — VANCOMYCIN HCL 2000 MG/400ML IV SOLN
2000.0000 mg | Freq: Once | INTRAVENOUS | Status: AC
  Administered 2022-12-17: 2000 mg via INTRAVENOUS
  Filled 2022-12-17: qty 400

## 2022-12-17 MED ORDER — LORAZEPAM 2 MG/ML IJ SOLN
1.0000 mg | Freq: Once | INTRAMUSCULAR | Status: AC
  Administered 2022-12-17: 1 mg via INTRAVENOUS
  Filled 2022-12-17: qty 1

## 2022-12-17 MED ORDER — PROCHLORPERAZINE EDISYLATE 10 MG/2ML IJ SOLN
5.0000 mg | Freq: Four times a day (QID) | INTRAMUSCULAR | Status: DC | PRN
  Administered 2022-12-17: 10 mg via INTRAVENOUS
  Filled 2022-12-17 (×2): qty 2

## 2022-12-17 MED ORDER — ONDANSETRON HCL 4 MG/2ML IJ SOLN: 4.0000 mg | Freq: Once | INTRAMUSCULAR | Status: DC

## 2022-12-17 MED ORDER — SODIUM CHLORIDE 0.9 % IV BOLUS
1000.0000 mL | Freq: Once | INTRAVENOUS | Status: AC
  Administered 2022-12-17: 1000 mL via INTRAVENOUS

## 2022-12-17 MED ORDER — LORAZEPAM 2 MG/ML IJ SOLN: 1.0000 mg | INTRAMUSCULAR | Status: DC | PRN

## 2022-12-17 MED ORDER — LORAZEPAM 1 MG PO TABS: 1.0000 mg | ORAL_TABLET | ORAL | Status: DC | PRN

## 2022-12-17 MED ORDER — THIAMINE MONONITRATE 100 MG PO TABS
100.0000 mg | ORAL_TABLET | Freq: Every day | ORAL | Status: DC
  Administered 2022-12-18 – 2022-12-19 (×2): 100 mg via ORAL
  Filled 2022-12-17 (×2): qty 1

## 2022-12-17 MED ORDER — CHLORHEXIDINE GLUCONATE CLOTH 2 % EX PADS
6.0000 | MEDICATED_PAD | Freq: Every day | CUTANEOUS | Status: DC
  Administered 2022-12-17 – 2022-12-18 (×2): 6 via TOPICAL

## 2022-12-17 MED ORDER — ENOXAPARIN SODIUM 40 MG/0.4ML IJ SOSY: 40.0000 mg | PREFILLED_SYRINGE | INTRAMUSCULAR | Status: DC

## 2022-12-17 MED ORDER — LORAZEPAM 1 MG PO TABS: 0.0000 mg | ORAL_TABLET | Freq: Two times a day (BID) | ORAL | Status: DC

## 2022-12-17 MED ORDER — ONDANSETRON HCL 4 MG/2ML IJ SOLN
INTRAMUSCULAR | Status: AC
  Filled 2022-12-17: qty 2

## 2022-12-17 MED ORDER — ACETAMINOPHEN 325 MG PO TABS
650.0000 mg | ORAL_TABLET | Freq: Four times a day (QID) | ORAL | Status: DC | PRN
  Administered 2022-12-17: 650 mg via ORAL
  Filled 2022-12-17: qty 2

## 2022-12-17 MED ORDER — QUETIAPINE FUMARATE ER 50 MG PO TB24
150.0000 mg | ORAL_TABLET | Freq: Every day | ORAL | Status: DC
  Administered 2022-12-17 – 2022-12-18 (×2): 150 mg via ORAL
  Filled 2022-12-17 (×3): qty 3

## 2022-12-17 MED ORDER — DOCUSATE SODIUM 100 MG PO CAPS: 100.0000 mg | ORAL_CAPSULE | Freq: Two times a day (BID) | ORAL | Status: DC | PRN

## 2022-12-17 MED ORDER — MAGNESIUM SULFATE 2 GM/50ML IV SOLN
2.0000 g | Freq: Once | INTRAVENOUS | Status: AC
  Administered 2022-12-17: 2 g via INTRAVENOUS
  Filled 2022-12-17: qty 50

## 2022-12-17 MED ORDER — LACTATED RINGERS IV SOLN: INTRAVENOUS | Status: AC

## 2022-12-17 MED ORDER — HEPARIN SODIUM (PORCINE) 5000 UNIT/ML IJ SOLN
5000.0000 [IU] | Freq: Three times a day (TID) | INTRAMUSCULAR | Status: DC
  Administered 2022-12-17 – 2022-12-18 (×5): 5000 [IU] via SUBCUTANEOUS
  Filled 2022-12-17 (×5): qty 1

## 2022-12-17 MED ORDER — LORAZEPAM 2 MG/ML IJ SOLN: 0.0000 mg | Freq: Two times a day (BID) | INTRAMUSCULAR | Status: DC

## 2022-12-17 NOTE — ED Provider Notes (Signed)
[place] EMERGENCY DEPARTMENT AT [facility] Provider Note   CSN: [id number] Arrival date & time: 12/17/22  0757     History  Chief Complaint  Patient presents with   Nausea    Ruben [name] is a 72 y.o. male.  Patient with history of HTN, BPH, alcohol abuse, PUD, HLD, presents with headache, vomiting, alcohol intoxication. He was previously sober and started drinking 3 months ago. Denies daily use but was drinking vodka last night. He reports headache and vomiting started during the night. No hematemesis or abdominal pain. No chest pain. He reports mild SOB. He denies falls or trauma. No fever.   The history is provided by the patient. No language interpreter was used.       Home Medications Prior to Admission medications   Medication Sig Start Date End Date Taking? Authorizing Provider  buPROPion (WELLBUTRIN XL) 150 MG 24 hr tablet Take 150 mg by mouth every morning. 05/02/22   [provider]  cloNIDine (CATAPRES) 0.1 MG tablet Take 0.1 mg by mouth at bedtime.    [provider]  finasteride (PROSCAR) 5 MG tablet Take 5 mg by mouth daily.    [provider]  gabapentin (NEURONTIN) 300 MG capsule Take 300 mg by mouth at bedtime.    [provider]  hydrOXYzine (ATARAX) 50 MG tablet Take 50 mg by mouth at bedtime. 09/07/21   [provider]  ibuprofen (ADVIL) 200 MG tablet Take 400 mg by mouth every 6 (six) hours as needed for moderate pain.    [provider]  magnesium hydroxide (MILK OF MAGNESIA) 400 MG/5ML suspension Take 30 mLs by mouth every other day.    [provider]  methocarbamol (ROBAXIN) 500 MG tablet Take 500 mg by mouth every 4 (four) hours as needed for muscle spasms. 03/10/21   [provider]  metoprolol tartrate (LOPRESSOR) 50 MG tablet Take 50 mg by mouth 2 (two) times daily.    [provider]  QUEtiapine Fumarate (SEROQUEL XR) 150 MG 24 hr tablet Take 150 mg by mouth  at bedtime.    [provider]  rosuvastatin (CRESTOR) 20 MG tablet Take 20 mg by mouth every Monday, Wednesday, and Friday. 08/23/21   [provider]  senna-docusate (SENOKOT-S) 8.6-50 MG tablet Take 1 tablet by mouth daily as needed for mild constipation.    [provider]  tamsulosin (FLOMAX) 0.4 MG CAPS capsule Take 0.4 mg by mouth at bedtime.    [provider]      Allergies    Patient has no known allergies.    Review of Systems   Review of Systems  Physical Exam Updated Vital Signs BP 136/73   Pulse (!) 144   Temp 97.9 F (36.6 C) (Oral)   Resp 20   Ht 5\' 11"  (1.803 m)   Wt 103.8 kg   SpO2 100%   BMI 31.92 kg/m  Physical Exam Vitals and nursing note reviewed.  Constitutional:      Appearance: Normal appearance.  HENT:     Head: Normocephalic and atraumatic.     Mouth/Throat:     Mouth: Mucous membranes are dry.  Eyes:     General: No scleral icterus.    Conjunctiva/sclera: Conjunctivae normal.  Cardiovascular:     Rate and Rhythm: Tachycardia present.     Heart sounds: No murmur heard. Pulmonary:     Effort: Pulmonary effort is normal.     Breath sounds: No wheezing, rhonchi or  rales.  Chest:     Chest wall: No tenderness.  Abdominal:     Tenderness: There is abdominal tenderness (Diffuse).  Musculoskeletal:        General: Normal range of motion.     Cervical back: Normal range of motion and neck supple.  Skin:    General: Skin is warm and dry.  Neurological:     Mental Status: He is alert and oriented to person, place, and time.     ED Results / Procedures / Treatments   Labs (all labs ordered are listed, but only abnormal results are displayed) Labs Reviewed  COMPREHENSIVE METABOLIC PANEL - Abnormal; Notable for the following components:      Result Value   Potassium 5.5 (*)    CO2 <7 (*)    Glucose, Bld 126 (*)    Creatinine, Ser 1.99 (*)    Calcium 8.4 (*)    GFR, Estimated 35 (*)    All other  components within normal limits  ETHANOL - Abnormal; Notable for the following components:   Alcohol, Ethyl (B) 101 (*)    All other components within normal limits  CBC WITH DIFFERENTIAL/PLATELET - Abnormal; Notable for the following components:   WBC 22.1 (*)    RBC 6.07 (*)    Hemoglobin 17.2 (*)    HCT 54.9 (*)    Neutro Abs 18.1 (*)    Monocytes Absolute 1.3 (*)    Basophils Absolute 0.7 (*)    All other components within normal limits  I-STAT CG4 LACTIC ACID, ED - Abnormal; Notable for the following components:   Lactic Acid, Venous >15.0 (*)    All other components within normal limits  I-STAT VENOUS BLOOD GAS, ED - Abnormal; Notable for the following components:   pH, Ven 6.972 (*)    pCO2, Ven 23.9 (*)    pO2, Ven 99 (*)    Bicarbonate 5.5 (*)    TCO2 6 (*)    Acid-base deficit 25.0 (*)    Sodium 134 (*)    Potassium 5.8 (*)    Calcium, Ion 0.98 (*)    All other components within normal limits  CULTURE, BLOOD (ROUTINE X 2)  CULTURE, BLOOD (ROUTINE X 2)  LIPASE, BLOOD  MAGNESIUM  URINALYSIS, ROUTINE W REFLEX MICROSCOPIC  RAPID URINE DRUG SCREEN, HOSP PERFORMED  PROTIME-INR  ACETAMINOPHEN LEVEL  SALICYLATE LEVEL  VOLATILES,BLD-ACETONE,ETHANOL,ISOPROP,METHANOL  OSMOLALITY  OSMOLALITY, URINE  COOXEMETRY PANEL   Results for orders placed or performed during the hospital encounter of 12/17/22 (from the past 24 hour(s))  Comprehensive metabolic panel     Status: Abnormal   Collection Time: 12/17/22  8:38 AM  Result Value Ref Range   Sodium 136 135 - 145 mmol/L   Potassium 5.5 (H) 3.5 - 5.1 mmol/L   Chloride 100 98 - 111 mmol/L   CO2 <7 (L) 22 - 32 mmol/L   Glucose, Bld 126 (H) 70 - 99 mg/dL   BUN 21 8 - 23 mg/dL   Creatinine, Ser 1.61 (H) 0.61 - 1.24 mg/dL   Calcium 8.4 (L) 8.9 - 10.3 mg/dL   Total Protein 7.6 6.5 - 8.1 g/dL   Albumin 4.1 3.5 - 5.0 g/dL   AST 35 15 - 41 U/L   ALT 32 0 - 44 U/L   Alkaline Phosphatase 70 38 - 126 U/L   Total Bilirubin 0.7 0.3  - 1.2 mg/dL   GFR, Estimated 35 (L) >60 mL/min   Anion gap NOT CALCULATED 5 - 15  Ethanol     Status: Abnormal   Collection Time: 12/17/22  8:38 AM  Result Value Ref Range   Alcohol, Ethyl (B) 101 (H) <10 mg/dL  CBC with Differential     Status: Abnormal   Collection Time: 12/17/22  8:38 AM  Result Value Ref Range   WBC 22.1 (H) 4.0 - 10.5 K/uL   RBC 6.07 (H) 4.22 - 5.81 MIL/uL   Hemoglobin 17.2 (H) 13.0 - 17.0 g/dL   HCT 96.2 (H) 95.2 - 84.1 %   MCV 90.4 80.0 - 100.0 fL   MCH 28.3 26.0 - 34.0 pg   MCHC 31.3 30.0 - 36.0 g/dL   RDW 32.4 40.1 - 02.7 %   Platelets 334 150 - 400 K/uL   nRBC 0.1 0.0 - 0.2 %   Neutrophils Relative % 82 %   Neutro Abs 18.1 (H) 1.7 - 7.7 K/uL   Lymphocytes Relative 9 %   Lymphs Abs 2.0 0.7 - 4.0 K/uL   Monocytes Relative 6 %   Monocytes Absolute 1.3 (H) 0.1 - 1.0 K/uL   Eosinophils Relative 0 %   Eosinophils Absolute 0.0 0.0 - 0.5 K/uL   Basophils Relative 3 %   Basophils Absolute 0.7 (H) 0.0 - 0.1 K/uL   nRBC 0 0 /100 WBC   Abs Immature Granulocytes 0.00 0.00 - 0.07 K/uL  Lipase, blood     Status: None   Collection Time: 12/17/22  8:38 AM  Result Value Ref Range   Lipase 23 11 - 51 U/L  Magnesium     Status: None   Collection Time: 12/17/22  8:38 AM  Result Value Ref Range   Magnesium 2.3 1.7 - 2.4 mg/dL  I-Stat CG4 Lactic Acid     Status: Abnormal   Collection Time: 12/17/22 10:00 AM  Result Value Ref Range   Lactic Acid, Venous >15.0 (HH) 0.5 - 1.9 mmol/L   Comment NOTIFIED PHYSICIAN   I-Stat venous blood gas, ([facility], [facility], [facility])     Status: Abnormal   Collection Time: 12/17/22 10:14 AM  Result Value Ref Range   pH, Ven 6.972 (LL) 7.25 - 7.43   pCO2, Ven 23.9 (L) 44 - 60 mmHg   pO2, Ven 99 (H) 32 - 45 mmHg   Bicarbonate 5.5 (L) 20.0 - 28.0 mmol/L   TCO2 6 (L) 22 - 32 mmol/L   O2 Saturation 93 %   Acid-base deficit 25.0 (H) 0.0 - 2.0 mmol/L   Sodium 134 (L) 135 - 145 mmol/L   Potassium 5.8 (H) 3.5 - 5.1 mmol/L   Calcium, Ion 0.98 (L)  1.15 - 1.40 mmol/L   HCT 49.0 39.0 - 52.0 %   Hemoglobin 16.7 13.0 - 17.0 g/dL   Sample type VENOUS    Comment NOTIFIED PHYSICIAN      EKG EKG Interpretation Date/Time:  Sunday December 17 2022 09:56:38 EDT Ventricular Rate:  142 PR Interval:  73 QRS Duration:  92 QT Interval:  326 QTC Calculation: 502 R Axis:   75  Text Interpretation: Sinus tachycardia Probable left atrial enlargement RSR' in V1 or V2, probably normal variant Minimal ST elevation, inferior leads Prolonged QT interval Confirmed by Fulton Reek [phone]) on 12/17/2022 10:05:32 AM  Radiology CT Head Wo Contrast  Result Date: 12/17/2022 CLINICAL DATA:  72 year old male with new onset headache. Possible sepsis. EXAM: CT HEAD WITHOUT CONTRAST TECHNIQUE: Contiguous axial images were obtained from the base of the skull through the vertex without intravenous contrast. RADIATION DOSE REDUCTION: This exam  was performed according to the departmental dose-optimization program which includes automated exposure control, adjustment of the mA and/or kV according to patient size and/or use of iterative reconstruction technique. COMPARISON:  Head CT 12/11/2020. FINDINGS: Brain: Cerebral volume remains normal for age. No midline shift, ventriculomegaly, mass effect, evidence of mass lesion, intracranial hemorrhage or evidence of cortically based acute infarction. Gray-white matter differentiation is stable and within normal limits for age. Vascular: No suspicious intracranial vascular hyperdensity. Calcified atherosclerosis at the skull base. Skull: No acute osseous abnormality identified. Sinuses/Orbits: Visualized paranasal sinuses and mastoids are stable and well aerated. Other: Visualized orbits and scalp soft tissues are within normal limits. IMPRESSION: Stable and negative for age noncontrast Head CT. Electronically Signed   By: Odessa Fleming M.D.   On: 12/17/2022 10:39    Procedures Procedures    Medications Ordered in ED Medications   thiamine (VITAMIN B1) tablet 100 mg ( Oral See Alternative 12/17/22 0914)    Or  thiamine (VITAMIN B1) injection 100 mg (100 mg Intravenous Given 12/17/22 0914)  sodium bicarbonate 150 mEq in dextrose 5 % 1,150 mL infusion (has no administration in time range)  piperacillin-tazobactam (ZOSYN) IVPB 3.375 g (has no administration in time range)  vancomycin (VANCOREADY) IVPB 2000 mg/400 mL (has no administration in time range)  calcium gluconate 1 g/ 50 mL sodium chloride IVPB (has no administration in time range)  sodium chloride 0.9 % bolus 1,000 mL (0 mLs Intravenous Stopped 12/17/22 0942)  LORazepam (ATIVAN) injection 1 mg (1 mg Intravenous Given 12/17/22 0839)  magnesium sulfate IVPB 2 g 50 mL (0 g Intravenous Stopped 12/17/22 0942)  LORazepam (ATIVAN) injection 1 mg (1 mg Intravenous Given 12/17/22 0900)  lactated ringers bolus 1,000 mL (1,000 mLs Intravenous New Bag/Given 12/17/22 0941)  sodium bicarbonate injection 50 mEq (50 mEq Intravenous Given 12/17/22 1033)    ED Course/ Medical Decision Making/ A&P Clinical Course as of 12/17/22 1045  Sun Dec 17, 2022  0821 Patient to ED with headache and vomiting since last night. History alcoholism, drinking x 3 months after 1 year of sobriety. Tachycardic on arrival, actively vomiting. EKG NSR, rate 140, prolonged QTc. Ativan ordered for symptom relief, IV fluids. Labs pending. Reviewed with Dr. Earlene Plater.  [SU]  [id number] CIWA protocol initiated.  [SU]  0902 He has received 1 mg Lorazepam, fluids bolusing. He remains tachy to 148. Additional lorazepam ordered. No further vomiting.  [SU]  0934 HR remains 145. Patient reports he feels better, less headache, no nausea. WBC high at 22.1. Lactic acid added to lab panel.  [SU]  0956 CO2 <7, critical value. VBG ordered. Cr 1.99 represents AKI. Patient more somnolent, felt secondary to Ativan, but wakes easily, without confusion. VSS - still tachycardic. Repeat EKG felt sinus rhythm. CT head, abd/pel ordered for further  evaluation.  [SU]  1018 pH on VBG 6.972, HCO3 5.5, PCO2 23.9 c/w partially compensated metabolic acidosis. Ativan orders discontinued to avoid decreased respiratory drive. Critical care paged for consultation. Bicarb drip started, Calcium for mild hyperK.  [SU]  1043 Discussed with Critical care who will see him in the ED.  [SU]  1044 2nd IV started. [SU]    Clinical Course User Index [SU] Elpidio Anis, PA-C                                 Medical Decision Making Amount and/or Complexity of Data Reviewed Labs: ordered. Radiology: ordered.  Risk OTC  drugs. Prescription drug management. Decision regarding hospitalization.           Final Clinical Impression(s) / ED Diagnoses Final diagnoses:  None    Rx / DC Orders ED Discharge Orders     None         Elpidio Anis, PA-C 12/17/22 1047    Laurence Spates, MD 12/17/22 1614

## 2022-12-17 NOTE — Progress Notes (Signed)
PIV consult: Pt with PIV B antecubitals. Receiving irritant medications, recommend PICC vs central line as a forearm site will likely infiltrate at [facility]. Discussed with Osvaldo Shipper, RN.

## 2022-12-17 NOTE — ED Provider Notes (Signed)
.  Critical Care  Performed by: Laurence Spates, MD Authorized by: Laurence Spates, MD   Critical care provider statement:    Critical care time (minutes):  35   Critical care time was exclusive of:  Separately billable procedures and treating other patients   Critical care was necessary to treat or prevent imminent or life-threatening deterioration of the following conditions:  Sepsis and dehydration   Critical care was time spent personally by me on the following activities:  Development of treatment plan with patient or surrogate, discussions with consultants, evaluation of patient's response to treatment, examination of patient, ordering and review of laboratory studies, ordering and review of radiographic studies, ordering and performing treatments and interventions, pulse oximetry, re-evaluation of patient's condition, review of old charts and obtaining history from patient or surrogate   Care discussed with: admitting provider       Laurence Spates, MD 12/17/22 1615

## 2022-12-17 NOTE — Progress Notes (Addendum)
eLink Physician-Brief Progress Note Patient Name: Ruben [name] DOB: 1951/01/07 MRN: [id number]   Date of Service  12/17/2022  HPI/Events of Note  72 year old with a history of alcohol use, OSA, and proximal muscle weakness who presents to the hospital with nausea and vomiting after recent alcohol binge.  Concern for hyperglycemia.  eICU Interventions  CBG goal less than 180.  He has maintained this without intervention.  Add CBG monitoring for hyperglycemia every 6 hours.  Hold insulin for now.    2034 -patient requesting home Seroquel.  Patient has QT prolongation at 504.  Will maintain dose of 150 mg (rather than home dose of 300 mg) and continue home hydroxyzine at the same time.  2127 -advance diet as tolerated  2213 -bicarb improved to 19, transition off bicarb drip to LR infusion.  Intervention Category Minor Interventions: Routine modifications to care plan (e.g. PRN medications for pain, fever)  Rolla Kedzierski 12/17/2022, 8:14 PM

## 2022-12-17 NOTE — ED Notes (Signed)
Provider at bedside attempting to place US guided IV.

## 2022-12-17 NOTE — ED Triage Notes (Addendum)
PT BIB EMS from home for nausea. Pt not taking Naltrexone, has abstained from etoh for 1 year and 2 months and drank about 1/3 of vodka starting last night. EMS placed 22 left hand and gave 4mg  zofran which improved symptoms slights.   EMS VS 144/42 145 HR (ST) 100% spo2 BG 113

## 2022-12-17 NOTE — Progress Notes (Signed)
ED Pharmacy Antibiotic Sign Off An antibiotic consult was received from an ED provider for vancomycin and zosyn per pharmacy dosing for sepsis. A chart review was completed to assess appropriateness.   The following one time order(s) were placed:  Vancomycin 2g IV x 1 Zosyn 3.375g IV x 1  Further antibiotic and/or antibiotic pharmacy consults should be ordered by the admitting provider if indicated.   Thank you for allowing pharmacy to be a part of this patient's care.   Daylene Posey, [facility]  Clinical Pharmacist 12/17/22 10:26 AM

## 2022-12-17 NOTE — Progress Notes (Addendum)
Seen in f/u throughout the day to monitor tx response, case d/w NOK throughout the day and labs reviewed throughout the day.   Labs with improving LA from >15 to >9 now down to 4.3, and improving acidosis w serum bicarb 13 (from <7). Cr 1.66 and K 5  Ethanol 0.045 PCT was <0.1    Has not req initiation of precedex & mentation cont to improve w IVF/supportive care. Some dry heaves, no emesis.   Etoh abuse, early withdrawal Severe lactic acidosis AGMA AKI  Dehydration  Sepsis r/o  P -w improving bicarb, less concerned re BZD admin impacting compensatory resp drive. Will dc precedex (which he has not needed) and order for PRN BZD CIWA -cont micronutrient support  -Add'l 1L NS bolus  -cont Bicarb gtt -repeat metabolic panel tonight 2200 -- hopefully will be able to start weaning bicarb gtt pending this  -no further abx  -PRN antiemetics  NOK Candy has been updated multiple times. Neighbor Arline Asp has been reached and confirms she can care for pt dog in interim.    Add'l CCT   Tessie Fass MSN, AGACNP-BC [facility] Pulmonary/Critical Care Medicine Amion for pager  12/17/2022, 5:14 PM

## 2022-12-17 NOTE — H&P (Signed)
NAME:  Ruben [name], MRN:  [id number], DOB:  02/01/1951, LOS: 0 ADMISSION DATE:  12/17/2022, CONSULTATION DATE:  12/17/22  REFERRING MD:  Earlene Plater EM, CHIEF COMPLAINT:  AMS n/v   History of Present Illness:   72 yo M PMH etoh abuse- on/off sobriety-- maybe started again 68mo, chronic proximal muscle wkness x 45yr with normal EMG, BPH, OSA, DOE (felt of non-cardiac etiology after normal cath 05/2022) physical deconditioning who presented to ED 9/8 with n/v HA. Preceding this, it sounds like he had a bit of an etoh bender. Cousin reported that pt neighbor told her he started drinking again recently.     In ED he presented like etoh withdrawal w tremors, tachycardia n/v etc -- was given PRN ativan x2, Labs w some profound metabolic abnormalities -- severe metabolic acidosis serum bicarb <7 pH 6.9, LA>15. Got 1-2L  IVF, amp of bicarb and started bicarb gtt. Went for CT H and CT a/p non-con -- both of which are non-acute except for depended lung opacity likely atelectasis & significant bladder distention for which foley was subsequently placed   PCCM called for admission in this setting    Pertinent  Medical History  Etoh abuse BPH OSA  Physical deconditioning  Significant Hospital Events: Including procedures, antibiotic start and stop dates in addition to other pertinent events   9/8 admit to ICU with etoh withdrawal, abnormal labs  Interim History / Subjective:  LA >15  Severe metabolic acidosis  Started on bicarb gtt   Objective   Blood pressure 136/73, pulse (!) 144, temperature 97.9 F (36.6 C), temperature source Oral, resp. rate 20, height 5\' 11"  (1.803 m), weight 103.8 kg, SpO2 100%.        Intake/Output Summary (Last 24 hours) at 12/17/2022 1102 Last data filed at 12/17/2022 1055 Gross per 24 hour  Intake 2050 ml  Output --  Net 2050 ml   Filed Weights   12/17/22 0804  Weight: 103.8 kg    Examination: General: chronically and acutely ill older adult M  HENT: NCAT. Upper  dental veneers. Pink tacky mm  Lungs: even unlabored slightly incr RR  Cardiovascular: tachycardic cap refill brisk  Abdomen: round soft non-tender  Extremities: no acute joint deformity  Neuro: Lethargic, variable orientation -- 2-3. No focal neuro deficit  GU: foley, yellow urine   Resolved Hospital Problem list     Assessment & Plan:   Acute encephalopathy -- etoh withdrawal, severe acidosis  EtOH abuse w withdrawal  -no known hx withdrawal sz but he is a poor historian at present  P -NPO -micronutrient support -UDS -if we need chemical support for Dts, would actually favor precedex at low  dose  -aspiration precautions  -sz precautions  Severe metabolic acidosis Severe lactic acidosis -APAP and salicylate WNL, modest AKI, LFTs WNL  -think he is very dry, vomiting preceding admission   P -check volatiles  -cont bicarb gtt as started in ED , addl amp of bicarb  -add'l fluid bolus  -repeat BMP this afternoon  -LA now and q3hr x2  Prolonged qtc P -limit prolonging agents -optimize lytes   AKI Hyperkalemia Hypocalcemia  P -replace Ca  -not additionally correcting mild HyperK right now -- is on bicarb gtt, getting CaGlu  Leukocytosis, r/o sepsis Erythrocytosis -think a lot of this is hemoconcentration -- h/h 17/55 -was started on sepsis w/u in ED -- UA fine. Chest imaging more c/w atelectasis.  P -add'l L NS now, probably will need more  -AM CBC -  PCT -f/u cx data but low suspicion for infection. Got vanc zosyn in ED.  -MRSA swab   N/v -CT a/p without active inflammation, no evidence of obstruction. -likely in setting of etoh use/withdrawal P -supportive care as above -phenergan PRN  BPH  Urinary retention P -foley  -retention meds when taking POs   Hx  depression Hx sleep disturbance  -holding home meds    Best Practice (right click and "Reselect all SmartList Selections" daily)   Diet/type: NPO DVT prophylaxis: prophylactic heparin  GI  prophylaxis: N/A Lines: N/A Foley:  Yes, and it is still needed Code Status:  full code Last date of multidisciplinary goals of care discussion [9/8 talked with pt NOK who is his cousin [name] -- lives in [place], contact in chart.  Locally, there is also contact for his neighbor in chart who will hopefully be able to take care of his dog.]  Labs   CBC: Recent Labs  Lab 12/17/22 0838 12/17/22 1014  WBC 22.1*  --   NEUTROABS 18.1*  --   HGB 17.2* 16.7  HCT 54.9* 49.0  MCV 90.4  --   PLT 334  --     Basic Metabolic Panel: Recent Labs  Lab 12/17/22 0838 12/17/22 1014  NA 136 134*  K 5.5* 5.8*  CL 100  --   CO2 <7*  --   GLUCOSE 126*  --   BUN 21  --   CREATININE 1.99*  --   CALCIUM 8.4*  --   MG 2.3  --    GFR: Estimated Creatinine Clearance: 41.8 mL/min (A) (by C-G formula based on SCr of 1.99 mg/dL (H)). Recent Labs  Lab 12/17/22 0838 12/17/22 1000  WBC 22.1*  --   LATICACIDVEN  --  >15.0*    Liver Function Tests: Recent Labs  Lab 12/17/22 0838  AST 35  ALT 32  ALKPHOS 70  BILITOT 0.7  PROT 7.6  ALBUMIN 4.1   Recent Labs  Lab 12/17/22 0838  LIPASE 23   No results for input(s): "AMMONIA" in the last 168 hours.  ABG    Component Value Date/Time   PHART 7.218 (L) 06/23/2020 0005   PCO2ART 24.5 (L) 06/23/2020 0005   PO2ART 83.8 06/23/2020 0005   HCO3 5.5 (L) 12/17/2022 1014   TCO2 6 (L) 12/17/2022 1014   ACIDBASEDEF 25.0 (H) 12/17/2022 1014   O2SAT 93 12/17/2022 1014     Coagulation Profile: No results for input(s): "INR", "PROTIME" in the last 168 hours.  Cardiac Enzymes: No results for input(s): "CKTOTAL", "CKMB", "CKMBINDEX", "TROPONINI" in the last 168 hours.  HbA1C: Hgb A1c MFr Bld  Date/Time Value Ref Range Status  12/26/2019 05:01 AM 5.3 4.8 - 5.6 % Final    Comment:    (NOTE) Pre diabetes:          5.7%-6.4%  Diabetes:              >6.4%  Glycemic control for   <7.0% adults with diabetes   10/03/2019 04:09 AM 5.7 (H)  4.8 - 5.6 % Final    Comment:    (NOTE) Pre diabetes:          5.7%-6.4%  Diabetes:              >6.4%  Glycemic control for   <7.0% adults with diabetes     CBG: No results for input(s): "GLUCAP" in the last 168 hours.  Review of Systems:   Review of Systems  Constitutional:  Positive  for diaphoresis.  Respiratory:  Positive for shortness of breath.   Cardiovascular: Negative.   Gastrointestinal:  Positive for nausea and vomiting.  Skin:  Negative for itching and rash.  Psychiatric/Behavioral:  Positive for substance abuse.    Limited in setting of encephalopathy   Past Medical History:  He,  has a past medical history of Alcoholism (HCC), Alcoholism (HCC), Aortic atherosclerosis (HCC) (12/26/2016), Arthritis, BPH (benign prostatic hyperplasia), Chronic constipation, Duodenal ulcer, Dyspnea (12/17/2017), ED (erectile dysfunction), Foley catheter in place, GERD (gastroesophageal reflux disease), Heart palpitations, Hiatal hernia, History of adenomatous polyp of colon, History of Helicobacter pylori infection, Hypercholesteremia, Hypertension, MR (mitral regurgitation), Pre-diabetes, Pulmonary nodule (12/26/2016), Rosacea, Schatzki's ring, Urinary retention, and Varicocele (02/25/2014).   Surgical History:   Past Surgical History:  Procedure Laterality Date   BALLOON DILATION N/A 06/08/2014   Procedure: BALLOON DILATION;  Surgeon: Charolett Bumpers, MD;  Location: [facility];  Service: Endoscopy;  Laterality: N/A;   COLONOSCOPY WITH PROPOFOL N/A 06/08/2014   Procedure: COLONOSCOPY WITH PROPOFOL;  Surgeon: Charolett Bumpers, MD;  Location: [facility];  Service: Endoscopy;  Laterality: N/A;   ESOPHAGOGASTRODUODENOSCOPY N/A 06/08/2014   Procedure: ESOPHAGOGASTRODUODENOSCOPY (EGD);  Surgeon: Charolett Bumpers, MD;  Location: [facility];  Service: Endoscopy;  Laterality: N/A;   ESOPHAGOGASTRODUODENOSCOPY (EGD) WITH ESOPHAGEAL DILATION N/A 12/31/2012   Procedure:  ESOPHAGOGASTRODUODENOSCOPY (EGD) WITH ESOPHAGEAL DILATION;  Surgeon: Charolett Bumpers, MD;  Location: [facility];  Service: Endoscopy;  Laterality: N/A;   GREEN LIGHT LASER TURP (TRANSURETHRAL RESECTION OF PROSTATE N/A 12/28/2015   Procedure: GREEN LIGHT LASER TURP (TRANSURETHRAL RESECTION OF PROSTATE;  Surgeon: Jerilee Field, MD;  Location[facility];  Service: Urology;  Laterality: N/A;   LEFT HEART CATH AND CORONARY ANGIOGRAPHY N/A 05/16/2022   Procedure: LEFT HEART CATH AND CORONARY ANGIOGRAPHY;  Surgeon: Elder Negus, MD;  Location: [facility];  Service: Cardiovascular;  Laterality: N/A;     Social History:   reports that he has never smoked. He has never used smokeless tobacco. He reports that he does not currently use alcohol. He reports that he does not currently use drugs after having used the following drugs: Marijuana.   Family History:  His family history includes Breast cancer in his mother; Heart failure in his mother; Other in his father; Stroke in his father. There is no history of Lung disease.   Allergies No Known Allergies   Home Medications  Prior to Admission medications   Medication Sig Start Date End Date Taking? Authorizing Provider  buPROPion (WELLBUTRIN XL) 150 MG 24 hr tablet Take 150 mg by mouth every morning. 05/02/22   [provider]  cloNIDine (CATAPRES) 0.1 MG tablet Take 0.1 mg by mouth at bedtime.    [provider]  finasteride (PROSCAR) 5 MG tablet Take 5 mg by mouth daily.    [provider]  gabapentin (NEURONTIN) 300 MG capsule Take 300 mg by mouth at bedtime.    [provider]  hydrOXYzine (ATARAX) 50 MG tablet Take 50 mg by mouth at bedtime. 09/07/21   [provider]  ibuprofen (ADVIL) 200 MG tablet Take 400 mg by mouth every 6 (six) hours as needed for moderate pain.    [provider]  magnesium hydroxide (MILK OF MAGNESIA) 400 MG/5ML suspension Take 30 mLs by  mouth every other day.    [provider]  methocarbamol (ROBAXIN) 500 MG tablet Take 500 mg by mouth every 4 (four) hours as needed for muscle spasms.  03/10/21   [provider]  metoprolol tartrate (LOPRESSOR) 50 MG tablet Take 50 mg by mouth 2 (two) times daily.    [provider]  QUEtiapine Fumarate (SEROQUEL XR) 150 MG 24 hr tablet Take 150 mg by mouth at bedtime.    [provider]  rosuvastatin (CRESTOR) 20 MG tablet Take 20 mg by mouth every Monday, Wednesday, and Friday. 08/23/21   [provider]  senna-docusate (SENOKOT-S) 8.6-50 MG tablet Take 1 tablet by mouth daily as needed for mild constipation.    [provider]  tamsulosin (FLOMAX) 0.4 MG CAPS capsule Take 0.4 mg by mouth at bedtime.    [provider]     Critical care time: 43 min      CRITICAL CARE Performed by: Lanier Clam   Total critical care time: 42 minutes  Critical care time was exclusive of separately billable procedures and treating other patients.  Critical care was necessary to treat or prevent imminent or life-threatening deterioration.  Critical care was time spent personally by me on the following activities: development of treatment plan with patient and/or surrogate as well as nursing, discussions with consultants, evaluation of patient's response to treatment, examination of patient, obtaining history from patient or surrogate, ordering and performing treatments and interventions, ordering and review of laboratory studies, ordering and review of radiographic studies, pulse oximetry and re-evaluation of patient's condition.  Tessie Fass MSN, AGACNP-BC [place] [facility]/Critical Care Medicine Amion for pager 12/17/2022, 1:00 PM

## 2022-12-18 DIAGNOSIS — N179 Acute kidney failure, unspecified: Secondary | ICD-10-CM | POA: Diagnosis not present

## 2022-12-18 DIAGNOSIS — E872 Acidosis, unspecified: Secondary | ICD-10-CM | POA: Diagnosis not present

## 2022-12-18 LAB — CBC
HCT: 40.4 % (ref 39.0–52.0)
Hemoglobin: 13.8 g/dL (ref 13.0–17.0)
MCH: 27.5 pg (ref 26.0–34.0)
MCHC: 34.2 g/dL (ref 30.0–36.0)
MCV: 80.6 fL (ref 80.0–100.0)
Platelets: 152 10*3/uL (ref 150–400)
RBC: 5.01 MIL/uL (ref 4.22–5.81)
RDW: 14.6 % (ref 11.5–15.5)
WBC: 8.4 10*3/uL (ref 4.0–10.5)
nRBC: 0 % (ref 0.0–0.2)

## 2022-12-18 LAB — GLUCOSE, CAPILLARY
Glucose-Capillary: 109 mg/dL — ABNORMAL HIGH (ref 70–99)
Glucose-Capillary: 117 mg/dL — ABNORMAL HIGH (ref 70–99)
Glucose-Capillary: 128 mg/dL — ABNORMAL HIGH (ref 70–99)
Glucose-Capillary: 139 mg/dL — ABNORMAL HIGH (ref 70–99)
Glucose-Capillary: 98 mg/dL (ref 70–99)

## 2022-12-18 LAB — BASIC METABOLIC PANEL
Anion gap: 8 (ref 5–15)
BUN: 18 mg/dL (ref 8–23)
CO2: 23 mmol/L (ref 22–32)
Calcium: 7.5 mg/dL — ABNORMAL LOW (ref 8.9–10.3)
Chloride: 104 mmol/L (ref 98–111)
Creatinine, Ser: 1.29 mg/dL — ABNORMAL HIGH (ref 0.61–1.24)
GFR, Estimated: 59 mL/min — ABNORMAL LOW (ref >60)
Glucose, Bld: 93 mg/dL (ref 70–99)
Potassium: 3.2 mmol/L — ABNORMAL LOW (ref 3.5–5.1)
Sodium: 135 mmol/L (ref 135–145)

## 2022-12-18 LAB — MAGNESIUM: Magnesium: 2.2 mg/dL (ref 1.7–2.4)

## 2022-12-18 MED ORDER — CLONIDINE HCL 0.1 MG PO TABS
0.1000 mg | ORAL_TABLET | Freq: Two times a day (BID) | ORAL | Status: DC
  Administered 2022-12-18 – 2022-12-19 (×3): 0.1 mg via ORAL
  Filled 2022-12-18 (×3): qty 1

## 2022-12-18 MED ORDER — METOPROLOL TARTRATE 50 MG PO TABS
50.0000 mg | ORAL_TABLET | Freq: Two times a day (BID) | ORAL | Status: DC
  Administered 2022-12-18 – 2022-12-19 (×3): 50 mg via ORAL
  Filled 2022-12-18 (×3): qty 1

## 2022-12-18 MED ORDER — FINASTERIDE 5 MG PO TABS
5.0000 mg | ORAL_TABLET | Freq: Every day | ORAL | Status: DC
  Administered 2022-12-18 – 2022-12-19 (×2): 5 mg via ORAL
  Filled 2022-12-18 (×2): qty 1

## 2022-12-18 MED ORDER — TAMSULOSIN HCL 0.4 MG PO CAPS
0.4000 mg | ORAL_CAPSULE | Freq: Every day | ORAL | Status: DC
  Administered 2022-12-18 – 2022-12-19 (×2): 0.4 mg via ORAL
  Filled 2022-12-18 (×2): qty 1

## 2022-12-18 NOTE — Progress Notes (Signed)
NAME:  Ruben [name], MRN:  [id number], DOB:  May 29, 1950, LOS: 1 ADMISSION DATE:  12/17/2022, CONSULTATION DATE:  12/17/22  REFERRING MD:  Earlene Plater EM, CHIEF COMPLAINT:  AMS n/v   History of Present Illness:   73 yo M PMH etoh abuse- on/off sobriety-- maybe started again 6mo, chronic proximal muscle wkness x 67yr with normal EMG, BPH, OSA, DOE (felt of non-cardiac etiology after normal cath 05/2022) physical deconditioning who presented to ED 9/8 with n/v HA. Preceding this, it sounds like he had a bit of an etoh bender. Cousin reported that pt neighbor told her he started drinking again recently.     In ED he presented like etoh withdrawal w tremors, tachycardia n/v etc -- was given PRN ativan x2, Labs w some profound metabolic abnormalities -- severe metabolic acidosis serum bicarb <7 pH 6.9, LA>15. Got 1-2L  IVF, amp of bicarb and started bicarb gtt. Went for CT H and CT a/p non-con -- both of which are non-acute except for depended lung opacity likely atelectasis & significant bladder distention for which foley was subsequently placed   PCCM called for admission in this setting    Pertinent  Medical History  Etoh abuse BPH OSA  Physical deconditioning  Significant Hospital Events: Including procedures, antibiotic start and stop dates in addition to other pertinent events   9/8 admit to ICU with etoh withdrawal, abnormal labs  Interim History / Subjective:   No overnight issues. Off bicarb, not needing precedex. Ate breakfast.   Objective   Blood pressure 114/72, pulse 92, temperature (!) 97.5 F (36.4 C), temperature source Oral, resp. rate 11, height 5\' 11"  (1.803 m), weight 100.5 kg, SpO2 97%.        Intake/Output Summary (Last 24 hours) at 12/18/2022 0859 Last data filed at 12/18/2022 0840 Gross per 24 hour  Intake 7272.91 ml  Output 3035 ml  Net 4237.91 ml   Filed Weights   12/17/22 0804 12/18/22 0500  Weight: 103.8 kg 100.5 kg    Examination: Gen:     Laying flat resting  comfortably HEENT:  mmm Lungs:   breathing nonlabored, no wheeze CV:         tachycardic, reuglar Abd:      + bowel sounds; soft, non-tender; no palpable masses, no distension Ext:    No edema Skin:      Warm and dry; flushed red in the face Neuro:   normal speech, awake, alert, oriented.   Resolved Hospital Problem list   Hyperkalemia Hypocalcemia   Assessment & Plan:   Acute encephalopathy -- improving EtOH abuse w withdrawal  P - continue ciwa. Scores low at this time.   AKI Severe metabolic acidosis - improving Severe lactic acidosis - resolved P Time out fluids Encourage enteral nutrition Repeat bmet in am  HTN Prolonged qtc P -limit prolonging agents -optimize lytes  - resume metoprolol and clonidine  Leukocytosis, r/o sepsis Erythrocytosis - likely hemoconcentration, improving on repeat H&H P - hold on abx, monitor.  - repeat CBC in am   N/v -CT a/p without active inflammation, no evidence of obstruction. -likely in setting of etoh use/withdrawal P -supportive care as above -phenergan PRN  BPH  Urinary retention P - adding retention meds today, home proscar, flomax  Hx  depression Hx sleep disturbance  -holding home meds    Best Practice (right click and "Reselect all SmartList Selections" daily)   Diet/type: Regular consistency (see orders) DVT prophylaxis: prophylactic heparin  GI prophylaxis: N/A Lines: N/A  Foley:  Yes, and it is no longer needed and removal ordered  Code Status:  full code Last date of multidisciplinary goals of care discussion [9/8 talked with pt NOK who is his cousin [name] -- lives in [place], contact in chart.  Locally, there is also contact for his neighbor in chart who will hopefully be able to take care of his dog.]  Will sign out to [facility] to assume care 9/10. Transfer to med surg.   Durel Salts, MD Pulmonary and Critical Care Medicine [facility] 12/18/2022 9:00 AM Pager: see AMION  If no response  to pager, please call critical care on call (see AMION) until 7pm After 7:00 pm call Elink     Labs   CBC: Recent Labs  Lab 12/17/22 0838 12/17/22 1014  WBC 22.1*  --   NEUTROABS 18.1*  --   HGB 17.2* 16.7  HCT 54.9* 49.0  MCV 90.4  --   PLT 334  --     Basic Metabolic Panel: Recent Labs  Lab 12/17/22 0838 12/17/22 1014 12/17/22 1549 12/17/22 2136  NA 136 134* 135 136  K 5.5* 5.8* 5.0 4.0  CL 100  --  102 103  CO2 <7*  --  13* 19*  GLUCOSE 126*  --  174* 154*  BUN 21  --  21 21  CREATININE 1.99*  --  1.66* 1.59*  CALCIUM 8.4*  --  8.0* 7.5*  MG 2.3  --  2.0  --    GFR: Estimated Creatinine Clearance: 51.5 mL/min (A) (by C-G formula based on SCr of 1.59 mg/dL (H)). Recent Labs  Lab 12/17/22 0838 12/17/22 1000 12/17/22 1246 12/17/22 1549  PROCALCITON  --   --  <0.10  --   WBC 22.1*  --   --   --   LATICACIDVEN  --  >15.0* >9.0* 4.3*    Liver Function Tests: Recent Labs  Lab 12/17/22 0838 12/17/22 1549  AST 35 31  ALT 32 27  ALKPHOS 70 60  BILITOT 0.7 1.5*  PROT 7.6 6.6  ALBUMIN 4.1 3.5   Recent Labs  Lab 12/17/22 0838  LIPASE 23   No results for input(s): "AMMONIA" in the last 168 hours.  ABG    Component Value Date/Time   PHART 7.218 (L) 06/23/2020 0005   PCO2ART 24.5 (L) 06/23/2020 0005   PO2ART 83.8 06/23/2020 0005   HCO3 5.5 (L) 12/17/2022 1014   TCO2 6 (L) 12/17/2022 1014   ACIDBASEDEF 25.0 (H) 12/17/2022 1014   O2SAT 93 12/17/2022 1014     Coagulation Profile: Recent Labs  Lab 12/17/22 1056  INR 1.2    Cardiac Enzymes: No results for input(s): "CKTOTAL", "CKMB", "CKMBINDEX", "TROPONINI" in the last 168 hours.  HbA1C: Hgb A1c MFr Bld  Date/Time Value Ref Range Status  12/26/2019 05:01 AM 5.3 4.8 - 5.6 % Final    Comment:    (NOTE) Pre diabetes:          5.7%-6.4%  Diabetes:              >6.4%  Glycemic control for   <7.0% adults with diabetes   10/03/2019 04:09 AM 5.7 (H) 4.8 - 5.6 % Final    Comment:     (NOTE) Pre diabetes:          5.7%-6.4%  Diabetes:              >6.4%  Glycemic control for   <7.0% adults with diabetes     CBG:  Recent Labs  Lab 12/17/22 1508 12/17/22 1925 12/17/22 2311 12/18/22 0318 12/18/22 0740  GLUCAP 165* 161* 152* 109* 98

## 2022-12-18 NOTE — Plan of Care (Signed)

## 2022-12-19 DIAGNOSIS — E872 Acidosis, unspecified: Secondary | ICD-10-CM | POA: Diagnosis not present

## 2022-12-19 LAB — GLUCOSE, CAPILLARY: Glucose-Capillary: 134 mg/dL — ABNORMAL HIGH (ref 70–99)

## 2022-12-19 NOTE — TOC Transition Note (Signed)
Transition of Care [facility]) - CM/SW Discharge Note   Patient Details  Name: Ruben [name] MRN: [id number] Date of Birth: March 08, 1951  Transition of Care [facility]) CM/SW Contact:  Tom-Johnson, Hershal Coria, RN Phone Number: 12/19/2022, 11:20 AM   Clinical Narrative:     Patient is scheduled for discharge today.  Readmission Risk Assessment done. Hospital f/u and discharge instructions on AVS. No TOC needs or recommendations noted. Patient uses [facility] to transport at discharge.  No further TOC needs noted.         Final next level of care: Home/Self Care Barriers to Discharge: Barriers Resolved   Patient Goals and CMS Choice CMS Medicare.gov Compare Post Acute Care list provided to:: Patient Choice offered to / list presented to : NA  Discharge Placement                  Patient to be transferred to facility by: Devin Going      Discharge Plan and Services Additional resources added to the After Visit Summary for                  DME Arranged: N/A DME Agency: NA       HH Arranged: NA HH Agency: NA        Social Determinants of Health (SDOH) Interventions SDOH Screenings   Tobacco Use: Low Risk  (12/17/2022)     Readmission Risk Interventions    12/19/2022   11:18 AM  Readmission Risk Prevention Plan  Transportation Screening Complete  PCP or Specialist Appt within 5-7 Days Complete  Home Care Screening Complete  Medication Review (RN CM) Referral to Pharmacy

## 2022-12-19 NOTE — Discharge Summary (Signed)
Physician Discharge Summary  Ruben [name]:[id number] DOB: March 17, 1951 DOA: 12/17/2022  PCP: Soundra Pilon, FNP  Admit date: 12/17/2022 Discharge date: 12/19/2022  Admitted From: Home Disposition: Home  Recommendations for Outpatient Follow-up:  Follow up with PCP in 1-2 weeks Continue to encourage abstinence from alcohol use. Recommend repeat BMP 1 week to reassess creatinine and serum bicarbonate level.  Home Health: No Equipment/Devices: None  Discharge Condition: Stable CODE STATUS: Full code Diet recommendation: Heart healthy diet  History of present illness:  Ruben [name] is a 72 year old male with past medical history significant for chronic proximal muscle weakness x 15 years with normal EMG, BPH, OSA, DOE (felt of non-cardiac etiology after normal cath 05/2022), EtOH use disorder with on/off sobriety who presented to [facility] ED via EMS from home on 9/8 with nausea/vomiting, headache.  Cousin reported that patient's neighbor told her he recently started drinking again.  He has not been taking his naltrexone.  In the ED, he presented with EtOH withdrawal with tremors, tachycardia associate with nausea/vomiting.  He was given as needed Ativan x 2.  Lab results with profound metabolic abnormalities with severe metabolic acidosis with serum bicarb less than 7, pH 6.9, lactic acid greater than 15.  [place] received IV fluid resuscitation, amp of bicarb and started on bicarb drip.  CT head and CT abdomen/pelvis without contrast with no acute findings other than atelectasis and significant bladder distention.  Foley catheter was placed.  PCCM consulted for admission and patient was admitted to the intensive care unit.  Significant Hospital events: 9/8: Admit to ICU with EtOH withdrawal, metabolic acidosis 9/10: Transferred to the hospitalist service, symptoms resolved discharging home  Hospital course:  Acute metabolic encephalopathy EtOH withdrawal EtOH intoxication Patient  presenting to ED with nausea/vomiting, headache.  Lactic acid greater than 15.0, VBG with pH 6.9, pCO2 23.9, PaO2 99.  Serum bicarbonate less than 7.  EtOH level 101.  Salicylate level less than 7.0.  UDS negative.  CT head without contrast with no acute intracranial abnormalities.  CT chest/abdomen/pelvis with distended urinary bladder, otherwise unrevealing.  Patient was initially admitted to the intensive care unit, started on a bicarb drip.  Patient's symptoms resolved.  Serum bicarbonate 23 at time of discharge.  Discussed with patient needs complete cessation/abstinence from alcohol.  Acute renal failure Severe metabolic acidosis Severe lactic acidosis Creatinine 1.99 on admission.  CT abdomen/pelvis notable for distended urinary bladder.  Initially required Foley catheterization which was ultimately discontinued.  Serum bicarbonate less than 7 with lactic acid greater than 15.  Etiology likely secondary to severe dehydration in setting of alcohol intoxication/misuse.  Supported with IV fluid hydration and bicarbonate drip with improvement.  Creatinine down to 1.29 at time of discharge.  Outpatient follow-up with PCP.  Recommend repeat BMP 1 week.  Essential hypertension Continue clonidine 0.1 mg p.o. nightly, metoprolol tartrate 50 mg p.o. twice daily.  Leukocytosis, sepsis ruled out Etiology likely secondary to hemoconcentration.  WBC count 22.1 on admission, improved to 8.4 with IV fluid hydration.  Nausea/vomiting Etiology likely secondary to alcohol abuse/withdrawal.  CT abdomen/pelvis without active inflammation, no evidence of obstruction.  Resolved following treatment as above.  BPH Urinary retention Initially required Foley catheter during initial days of hospitalization which has now been discontinued.  Continue tamsulosin 0.4 mg p.o. nightly, finasteride 5 mg p.o. daily.  Depression/insomnia Continue bupropion and Seroquel.  HLD: Continue Crestor 20 mg p.o.  Monday/Wednesday/Friday  Morbid obesity Body mass index is 32.29 kg/m.  Discussed with patient  needs for aggressive lifestyle changes/weight loss as this complicates all facets of care.  Outpatient follow-up with PCP.  May benefit from bariatric evaluation outpatient.   Discharge Diagnoses:  Principal Problem:   Metabolic acidosis Active Problems:   Encephalopathy acute   Lactic acidosis   Dehydration   Acute lactic acidosis    Discharge Instructions  Discharge Instructions     Call MD for:  difficulty breathing, headache or visual disturbances   Complete by: As directed    Call MD for:  extreme fatigue   Complete by: As directed    Call MD for:  persistant dizziness or light-headedness   Complete by: As directed    Call MD for:  persistant nausea and vomiting   Complete by: As directed    Call MD for:  severe uncontrolled pain   Complete by: As directed    Call MD for:  temperature >100.4   Complete by: As directed    Diet - low sodium heart healthy   Complete by: As directed    Increase activity slowly   Complete by: As directed       Allergies as of 12/19/2022   No Known Allergies      Medication List     TAKE these medications    buPROPion 150 MG 24 hr tablet Commonly known as: WELLBUTRIN XL Take 150 mg by mouth every morning.   cloNIDine 0.1 MG tablet Commonly known as: CATAPRES Take 0.1 mg by mouth at bedtime.   finasteride 5 MG tablet Commonly known as: PROSCAR Take 5 mg by mouth daily.   gabapentin 300 MG capsule Commonly known as: NEURONTIN Take 300 mg by mouth at bedtime.   hydrOXYzine 50 MG tablet Commonly known as: ATARAX Take 50 mg by mouth at bedtime.   ibuprofen 200 MG tablet Commonly known as: ADVIL Take 400 mg by mouth every 6 (six) hours as needed for moderate pain.   methocarbamol 500 MG tablet Commonly known as: ROBAXIN Take 500 mg by mouth every 4 (four) hours as needed for muscle spasms.   metoprolol tartrate 50 MG  tablet Commonly known as: LOPRESSOR Take 50 mg by mouth 2 (two) times daily.   Milk of Magnesia 2400 MG/30ML suspension Generic drug: magnesium hydroxide Take 30 mLs by mouth every other day.   QUEtiapine Fumarate 150 MG 24 hr tablet Commonly known as: SEROQUEL XR Take 150 mg by mouth at bedtime.   QUEtiapine 300 MG tablet Commonly known as: SEROQUEL Take 300 mg by mouth at bedtime.   rosuvastatin 20 MG tablet Commonly known as: CRESTOR Take 20 mg by mouth every Monday, Wednesday, and Friday.   senna-docusate 8.6-50 MG tablet Commonly known as: Senokot-S Take 1 tablet by mouth daily as needed for mild constipation.   tamsulosin 0.4 MG Caps capsule Commonly known as: FLOMAX Take 0.4 mg by mouth at bedtime.        Follow-up Information     Soundra Pilon, FNP. Schedule an appointment as soon as possible for a visit in 1 week(s).   Specialty: Family Medicine Contact information: [address] [place] [address] [phone]                No Known Allergies  Consultations: PCCM   Procedures/Studies: CT CHEST ABDOMEN PELVIS WO CONTRAST  Result Date: 12/17/2022 CLINICAL DATA:  72 year old male with new onset headache. Possible sepsis. EXAM: CT CHEST, ABDOMEN AND PELVIS WITHOUT CONTRAST TECHNIQUE: Multidetector CT imaging of the chest, abdomen and pelvis  was performed following the standard protocol without IV contrast. RADIATION DOSE REDUCTION: This exam was performed according to the departmental dose-optimization program which includes automated exposure control, adjustment of the mA and/or kV according to patient size and/or use of iterative reconstruction technique. COMPARISON:  CT Abdomen and Pelvis 09/19/2021. CTA chest 06/22/2020. FINDINGS: CT CHEST FINDINGS Cardiovascular: Calcified aortic atherosclerosis. Cardiac size at the upper limits of normal. No pericardial effusion. Vascular patency is not evaluated in the absence of IV contrast.  Mediastinum/Nodes: Negative noncontrast appearance of the mediastinum aside from chronic gastric hiatal hernia, small to moderate and may have a paraesophageal configuration. Lungs/Pleura: Major airways are patent. Lower lung volumes compared to 2022 and mildly asymmetric but entirely dependent lung opacity most resembles atelectasis. No convincing pleural fluid. Mild respiratory motion. No consolidation or convincing acute lung inflammation. Musculoskeletal: Mild chronic T4 superior endplate compression is stable since 2022. No acute osseous abnormality identified. CT ABDOMEN PELVIS FINDINGS Hepatobiliary: Chronic hepatic steatosis. Negative noncontrast gallbladder. Pancreas: Stable partial pancreatic atrophy. Spleen: Stable, diminutive and negative. Adrenals/Urinary Tract: Stable and negative noncontrast appearance of the adrenal glands and kidneys. Bilateral decompressed ureters. Distended urinary bladder (estimated 545 mL) and lobulated prostate extension into the base of the bladder, not significantly changed since 2023. Stomach/Bowel: Nondilated large bowel and small bowel. Distal large bowel diverticula but no active inflammation. Normal retrocecal appendix. Gas and fluid-filled stomach. Duodenum is decompressed. Small to moderate chronic gastric hiatal hernia does not appear significantly changed. No free air or free fluid. Vascular/Lymphatic: Vascular patency is not evaluated in the absence of IV contrast. Normal caliber abdominal aorta. Aortoiliac calcified atherosclerosis. No lymphadenopathy. Reproductive: Chronic prostatomegaly. Other: No pelvis free fluid. Musculoskeletal: No acute osseous abnormality identified. IMPRESSION: 1. Distended urinary bladder (545 mL) and prostatomegaly with chronic lobular prostate encroachment on the base of the bladder. Query urinary retention and/or bladder outlet obstruction. 2. Atelectasis. No other acute or inflammatory process identified in the noncontrast chest,  abdomen, or pelvis. 3. Chronic gastric hiatal hernia, hepatic steatosis, Aortic Atherosclerosis (ICD10-I70.0). Electronically Signed   By: Odessa Fleming M.D.   On: 12/17/2022 10:58   CT Head Wo Contrast  Result Date: 12/17/2022 CLINICAL DATA:  72 year old male with new onset headache. Possible sepsis. EXAM: CT HEAD WITHOUT CONTRAST TECHNIQUE: Contiguous axial images were obtained from the base of the skull through the vertex without intravenous contrast. RADIATION DOSE REDUCTION: This exam was performed according to the departmental dose-optimization program which includes automated exposure control, adjustment of the mA and/or kV according to patient size and/or use of iterative reconstruction technique. COMPARISON:  Head CT 12/11/2020. FINDINGS: Brain: Cerebral volume remains normal for age. No midline shift, ventriculomegaly, mass effect, evidence of mass lesion, intracranial hemorrhage or evidence of cortically based acute infarction. Gray-white matter differentiation is stable and within normal limits for age. Vascular: No suspicious intracranial vascular hyperdensity. Calcified atherosclerosis at the skull base. Skull: No acute osseous abnormality identified. Sinuses/Orbits: Visualized paranasal sinuses and mastoids are stable and well aerated. Other: Visualized orbits and scalp soft tissues are within normal limits. IMPRESSION: Stable and negative for age noncontrast Head CT. Electronically Signed   By: Odessa Fleming M.D.   On: 12/17/2022 10:39     Subjective: Patient seen examined bedside, resting calmly.  Lying in bed.No complaints.  States ready for discharge home.  Discussed with him extensively needs complete cessation/abstinence from continued alcohol use.  No other questions or concerns at this time.  Denies headache, no dizziness, no chest pain, no palpitations, no  shortness of breath, no abdominal pain, no fever/chills/night sweats, no nausea/vomiting/diarrhea, no focal weakness, no fatigue, no  paresthesias.  Negative vents overnight per nursing staff.  Discharge Exam: Vitals:   12/18/22 2358 12/19/22 0418  BP: 114/77 125/79  Pulse: 74 80  Resp: 17 17  Temp: 98.2 F (36.8 C) 97.7 F (36.5 C)  SpO2: 100% 95%   Vitals:   12/18/22 1558 12/18/22 2030 12/18/22 2358 12/19/22 0418  BP: (!) 118/102 132/81 114/77 125/79  Pulse: 88 82 74 80  Resp: 18 19 17 17   Temp: 98.6 F (37 C) 98.5 F (36.9 C) 98.2 F (36.8 C) 97.7 F (36.5 C)  TempSrc: Oral  Oral Oral  SpO2: 97% 95% 100% 95%  Weight:    105 kg  Height:        Physical Exam: GEN: NAD, alert and oriented x 3, obese HEENT: NCAT, PERRL, EOMI, sclera clear, MMM PULM: CTAB w/o wheezes/crackles, normal respiratory effort, on room air CV: RRR w/o M/G/R GI: abd soft, NTND, NABS, no R/G/M MSK: no peripheral edema, muscle strength globally intact 5/5 bilateral upper/lower extremities NEURO: CN II-XII intact, no focal deficits, sensation to light touch intact PSYCH: normal mood/affect Integumentary: dry/intact, no rashes or wounds    The results of significant diagnostics from this hospitalization (including imaging, microbiology, ancillary and laboratory) are listed below for reference.     Microbiology: Recent Results (from the past 240 hour(s))  Blood culture (routine x 2)     Status: None (Preliminary result)   Collection Time: 12/17/22 10:56 AM   Specimen: BLOOD RIGHT ARM  Result Value Ref Range Status   Specimen Description BLOOD RIGHT ARM  Final   Special Requests   Final    BOTTLES DRAWN AEROBIC AND ANAEROBIC Blood Culture adequate volume   Culture   Final    NO GROWTH 2 DAYS Performed at [facility], [address] [place], [place] [address]    Report Status PENDING  Incomplete  MRSA Next Gen by PCR, Nasal     Status: None   Collection Time: 12/17/22 12:01 PM   Specimen: Nasal Mucosa; Nasal Swab  Result Value Ref Range Status   MRSA by PCR Next Gen NOT DETECTED NOT DETECTED Final    Comment:  (NOTE) The GeneXpert MRSA Assay (FDA approved for NASAL specimens only), is one component of a comprehensive MRSA colonization surveillance program. It is not intended to diagnose MRSA infection nor to guide or monitor treatment for MRSA infections. Test performance is not FDA approved in patients less than 69 years old. Performed at [facility], [address] [place], [place] [address]   Blood culture (routine x 2)     Status: None (Preliminary result)   Collection Time: 12/17/22 12:37 PM   Specimen: BLOOD LEFT HAND  Result Value Ref Range Status   Specimen Description BLOOD LEFT HAND  Final   Special Requests   Final    BOTTLES DRAWN AEROBIC ONLY Blood Culture adequate volume   Culture   Final    NO GROWTH 2 DAYS Performed at [facility], [address] [place], [place] [address]    Report Status PENDING  Incomplete     Labs: BNP (last 3 results) No results for input(s): "BNP" in the last 8760 hours. Basic Metabolic Panel: Recent Labs  Lab 12/17/22 0838 12/17/22 1014 12/17/22 1549 12/17/22 2136 12/18/22 1438  NA 136 134* 135 136 135  K 5.5* 5.8* 5.0 4.0 3.2*  CL 100  --  102 103 104  CO2 <7*  --  13* 19* 23  GLUCOSE 126*  --  174* 154* 93  BUN 21  --  21 21 18   CREATININE 1.99*  --  1.66* 1.59* 1.29*  CALCIUM 8.4*  --  8.0* 7.5* 7.5*  MG 2.3  --  2.0  --  2.2   Liver Function Tests: Recent Labs  Lab 12/17/22 0838 12/17/22 1549  AST 35 31  ALT 32 27  ALKPHOS 70 60  BILITOT 0.7 1.5*  PROT 7.6 6.6  ALBUMIN 4.1 3.5   Recent Labs  Lab 12/17/22 0838  LIPASE 23   No results for input(s): "AMMONIA" in the last 168 hours. CBC: Recent Labs  Lab 12/17/22 0838 12/17/22 1014 12/18/22 1438  WBC 22.1*  --  8.4  NEUTROABS 18.1*  --   --   HGB 17.2* 16.7 13.8  HCT 54.9* 49.0 40.4  MCV 90.4  --  80.6  PLT 334  --  152   Cardiac Enzymes: No results for input(s): "CKTOTAL", "CKMB", "CKMBINDEX", "TROPONINI" in the last 168  hours. BNP: Invalid input(s): "POCBNP" CBG: Recent Labs  Lab 12/18/22 0740 12/18/22 1153 12/18/22 1603 12/18/22 2356 12/19/22 0545  GLUCAP 98 139* 117* 128* 134*   D-Dimer No results for input(s): "DDIMER" in the last 72 hours. Hgb A1c No results for input(s): "HGBA1C" in the last 72 hours. Lipid Profile No results for input(s): "CHOL", "HDL", "LDLCALC", "TRIG", "CHOLHDL", "LDLDIRECT" in the last 72 hours. Thyroid function studies No results for input(s): "TSH", "T4TOTAL", "T3FREE", "THYROIDAB" in the last 72 hours.  Invalid input(s): "FREET3" Anemia work up No results for input(s): "VITAMINB12", "FOLATE", "FERRITIN", "TIBC", "IRON", "RETICCTPCT" in the last 72 hours. Urinalysis    Component Value Date/Time   COLORURINE YELLOW 12/17/2022 1102   APPEARANCEUR HAZY (A) 12/17/2022 1102   LABSPEC 1.014 12/17/2022 1102   PHURINE 5.0 12/17/2022 1102   GLUCOSEU NEGATIVE 12/17/2022 1102   HGBUR MODERATE (A) 12/17/2022 1102   BILIRUBINUR NEGATIVE 12/17/2022 1102   KETONESUR 20 (A) 12/17/2022 1102   PROTEINUR 30 (A) 12/17/2022 1102   NITRITE NEGATIVE 12/17/2022 1102   LEUKOCYTESUR NEGATIVE 12/17/2022 1102   Sepsis Labs Recent Labs  Lab 12/17/22 0838 12/18/22 1438  WBC 22.1* 8.4   Microbiology Recent Results (from the past 240 hour(s))  Blood culture (routine x 2)     Status: None (Preliminary result)   Collection Time: 12/17/22 10:56 AM   Specimen: BLOOD RIGHT ARM  Result Value Ref Range Status   Specimen Description BLOOD RIGHT ARM  Final   Special Requests   Final    BOTTLES DRAWN AEROBIC AND ANAEROBIC Blood Culture adequate volume   Culture   Final    NO GROWTH 2 DAYS Performed at [facility], [address], [place], [place] [address]    Report Status PENDING  Incomplete  MRSA Next Gen by PCR, Nasal     Status: None   Collection Time: 12/17/22 12:01 PM   Specimen: Nasal Mucosa; Nasal Swab  Result Value Ref Range Status   MRSA by PCR Next Gen NOT  DETECTED NOT DETECTED Final    Comment: (NOTE) The GeneXpert MRSA Assay (FDA approved for NASAL specimens only), is one component of a comprehensive MRSA colonization surveillance program. It is not intended to diagnose MRSA infection nor to guide or monitor treatment for MRSA infections. Test performance is not FDA approved in patients less than 49 years old. Performed at  [facility], [address] [place], [place], [place] [address]   Blood culture (routine x 2)     Status: None (Preliminary result)   Collection Time: 12/17/22 12:37 PM   Specimen: BLOOD LEFT HAND  Result Value Ref Range Status   Specimen Description BLOOD LEFT HAND  Final   Special Requests   Final    BOTTLES DRAWN AEROBIC ONLY Blood Culture adequate volume   Culture   Final    NO GROWTH 2 DAYS Performed at [facility], [address], [place], [place] [address]    Report Status PENDING  Incomplete     Time coordinating discharge: Over 30 minutes  SIGNED:   Alvira Philips [place], DO  [facility] 12/19/2022, 10:50 AM

## 2022-12-19 NOTE — TOC Progression Note (Signed)
Transition of Care [facility]) - Progression Note    Patient Details  Name: Ruben [name] MRN: [id number] Date of Birth: 1950/08/07  [facility] CM/SW Contact  Marice Angelino [place], [place] Phone Number: 12/19/2022, 11:14 AM  Clinical Narrative:      Substance use tx resources added to AVS.     Expected Discharge Plan and Services         Expected Discharge Date: 12/19/22                                     Social Determinants of Health (SDOH) Interventions SDOH Screenings   Tobacco Use: Low Risk  (12/17/2022)    Readmission Risk Interventions     No data to display

## 2022-12-19 NOTE — Discharge Instructions (Signed)
Outpatient Substance Use Treatment Services   [facility]  Chemical Dependence Intensive Outpatient Program [address] [place], [place] [address]  [phone] Private insurance, Medicare A&B, and [facility]   [facility] (Alcohol and Drug Services)  [address],  [place], [place] [address] [phone] Medicaid, Self Pay/uninsured   [facility]      [name] Schwartz  [place], [place] [phone] Medicaid, [facility], Self Pay   [facility] [address]  [place], [place]  [phone] Self Pay Outpatient substance use treatment for individuals ages 13-25 Offer scholarships from the [facility] to help pay for treatment   [facility]      [address]    [place], [place] [address]  [phone] or [phone] Private Insurance Only   [facility] [address] [place], [place] [address] Medicaid and uninsure  [facility] Total Access Care [address]  Ginette Otto, [place] [address] [phone] Medicaid, Medicare, Private Insurance  [facility] at the [facility] [address]  [place], [place] [address] [phone] Services are free or reduced  [facility] [place] [address] [place], [place] [address] [phone] Medicaid, Self-Pay/Uninsured  [facility]  [address]  [place], [place] [address] [phone] ([facility]) Self Pay/uninsured, Medicaid Only   [facility] [address], [place] [address] [phone] Medicaid, Medicare, [facility] [address] [place], [place] [address] [phone] Private Insurance Only   [facility] Adolescent Substance use Program Males ages: 12-17 Adolescent Substance use Program     Residential Substance Use Treatment  Services   [facility] ([facility]  [address]  [place], [place] [address]  [phone] or [phone] Detox (Medicare, Medicaid, private insurance, and self pay/uninsured)  Residential Rehab 14 days (Medicare, Medicaid, private insurance, and self pay/uninsured)   [facility] (Residential Treatment Services)  [address] [place], [place]  [phone]  Male and Male Detox (Self Pay/Uninsured and Medicaid limited availability)  Rehab only Male ([place] and self pay/uninsured only)   [facility] [address]      [address]  [place], [place] [address]  [phone] or [phone] Detox and Residential Treatment Private Insurance Only   [facility] Residential Treatment Facility  [address]  [place], [place] [address]  [phone]  Treatment Only, must make assessment appointment, and must be sober for assessment appointment.  Self Pay/uninsured Only, Medicare A&B, [facility] [place] ID only!  [facility]     [address] [place], [place] [address] Walk in interviews M-Sat 8-4p No pending legal charges No insurance requirement [phone]   [facility]:  [facility] Referral  [address] [place], [place] [phone] (Self Pay/uninsured, [facility])  [facility] [address] [place], [place] [address] [phone] Detox and Residential Treatment Medicare and Private Insurance  [facility] [address]  [place], [place] [address] 28 Day Women's Facility: [phone] 28 Day Men's Facility: [phone] Long-term Residential Program:  [phone] Males 25 and Over (No Insurance, upfront fee)  [facility]  [address] [place], [place] [address] [phone] Private Insurance with [place], Private Pay  [facility] [address] [place], [place] [address] Local [phone] Private Insurance Only  [facility] [address]  [place], [place] [address]  [phone] (Males, upfront fee)  [facility] [address]  [address]  [place], [address] [phone] Private Insurance    [facility] Locations  [facility]  [address]  [place], [place]  [phone] [facility] Based Program for individuals experiencing homelessness Self Pay, No insurance  [facility]  Men's program: [facility] [address] [place], [place] [address] [phone]  [facility] Women's program: [facility] [address] [place], [place] [address] [phone] Christian Based Program for individuals experiencing homelessness Self Pay, No insurance  [facility] Men's Division [address] [place], [place] [address]  [phone] [facility] [place] Based Program for individuals experiencing homelessness Self Pay, No insurance  [facility] Women's Division [address] [place], [place] [address] [phone] [facility] [place] Based Program for individuals experiencing homelessness Self Pay, No insurance

## 2022-12-19 NOTE — Progress Notes (Signed)
DISCHARGE NOTE HOME Ruben [name] to be discharged Home per MD order. Discussed prescriptions and follow up appointments with the patient. Prescriptions given to patient; medication list explained in detail. Patient verbalized understanding.  Skin clean, dry and intact without evidence of skin break down, no evidence of skin tears noted. IV catheter discontinued intact. Site without signs and symptoms of complications. Dressing and pressure applied. Pt denies pain at the site currently. No complaints noted.  Patient free of lines, drains, and wounds.   An After Visit Summary (AVS) was printed and given to the patient. Patient escorted via wheelchair to discharge lounge, and discharged home via Arlyn Leak, RN

## 2022-12-22 LAB — CULTURE, BLOOD (ROUTINE X 2)
Culture: NO GROWTH
Culture: NO GROWTH
Special Requests: ADEQUATE
Special Requests: ADEQUATE

## 2022-12-27 DIAGNOSIS — Z23 Encounter for immunization: Secondary | ICD-10-CM | POA: Diagnosis not present

## 2022-12-27 DIAGNOSIS — R7303 Prediabetes: Secondary | ICD-10-CM | POA: Diagnosis not present

## 2022-12-27 DIAGNOSIS — F1021 Alcohol dependence, in remission: Secondary | ICD-10-CM | POA: Diagnosis not present

## 2022-12-27 DIAGNOSIS — F5101 Primary insomnia: Secondary | ICD-10-CM | POA: Diagnosis not present

## 2022-12-27 DIAGNOSIS — K219 Gastro-esophageal reflux disease without esophagitis: Secondary | ICD-10-CM | POA: Diagnosis not present

## 2022-12-27 DIAGNOSIS — E782 Mixed hyperlipidemia: Secondary | ICD-10-CM | POA: Diagnosis not present

## 2022-12-27 DIAGNOSIS — E538 Deficiency of other specified B group vitamins: Secondary | ICD-10-CM | POA: Diagnosis not present

## 2022-12-27 DIAGNOSIS — Z09 Encounter for follow-up examination after completed treatment for conditions other than malignant neoplasm: Secondary | ICD-10-CM | POA: Diagnosis not present

## 2022-12-27 DIAGNOSIS — I10 Essential (primary) hypertension: Secondary | ICD-10-CM | POA: Diagnosis not present

## 2022-12-27 DIAGNOSIS — D696 Thrombocytopenia, unspecified: Secondary | ICD-10-CM | POA: Diagnosis not present

## 2023-01-02 DIAGNOSIS — R3915 Urgency of urination: Secondary | ICD-10-CM | POA: Diagnosis not present

## 2023-01-02 DIAGNOSIS — R3914 Feeling of incomplete bladder emptying: Secondary | ICD-10-CM | POA: Diagnosis not present

## 2023-01-21 DIAGNOSIS — Z811 Family history of alcohol abuse and dependence: Secondary | ICD-10-CM | POA: Diagnosis not present

## 2023-01-21 DIAGNOSIS — R000 Tachycardia, unspecified: Secondary | ICD-10-CM | POA: Diagnosis not present

## 2023-01-21 DIAGNOSIS — I10 Essential (primary) hypertension: Secondary | ICD-10-CM | POA: Diagnosis not present

## 2023-01-21 DIAGNOSIS — Z79899 Other long term (current) drug therapy: Secondary | ICD-10-CM | POA: Diagnosis not present

## 2023-01-21 DIAGNOSIS — F10129 Alcohol abuse with intoxication, unspecified: Secondary | ICD-10-CM | POA: Diagnosis not present

## 2023-01-21 DIAGNOSIS — R110 Nausea: Secondary | ICD-10-CM | POA: Diagnosis not present

## 2023-01-21 DIAGNOSIS — F10929 Alcohol use, unspecified with intoxication, unspecified: Secondary | ICD-10-CM | POA: Diagnosis not present

## 2023-01-21 DIAGNOSIS — F1010 Alcohol abuse, uncomplicated: Secondary | ICD-10-CM | POA: Diagnosis not present

## 2023-01-22 DIAGNOSIS — I499 Cardiac arrhythmia, unspecified: Secondary | ICD-10-CM | POA: Diagnosis not present

## 2023-01-22 DIAGNOSIS — R000 Tachycardia, unspecified: Secondary | ICD-10-CM | POA: Diagnosis not present

## 2023-01-23 ENCOUNTER — Other Ambulatory Visit: Payer: Self-pay | Admitting: Gastroenterology

## 2023-01-23 DIAGNOSIS — R1310 Dysphagia, unspecified: Secondary | ICD-10-CM | POA: Diagnosis not present

## 2023-01-23 DIAGNOSIS — Z860100 Personal history of colon polyps, unspecified: Secondary | ICD-10-CM | POA: Diagnosis not present

## 2023-01-25 ENCOUNTER — Ambulatory Visit: Payer: Medicare HMO | Admitting: Internal Medicine

## 2023-01-25 ENCOUNTER — Encounter: Payer: Self-pay | Admitting: Internal Medicine

## 2023-01-25 VITALS — BP 138/84 | HR 108 | Ht 71.0 in | Wt 225.0 lb

## 2023-01-25 DIAGNOSIS — G4733 Obstructive sleep apnea (adult) (pediatric): Secondary | ICD-10-CM

## 2023-01-25 NOTE — Patient Instructions (Signed)
It was a pleasure to see you today!  Please schedule follow up scheduled with myself in 6 months.  If my schedule is not open yet, we will contact you with a reminder closer to that time. Please call [phone] if you haven't heard from [place] a month before, and always call us sooner if issues or concerns arise. You can also send [place] a message through MyChart, but but aware that this is not to be used for urgent issues and it may take up to 5-7 days to receive a reply. Please be aware that you will likely be able to view your results before I have a chance to respond to them. Please give [place] 5 business days to respond to any non-urgent results.   Shave your beard and try the cpap mask again. I think it might help. Follow up with [facility] if concerns.   I do think you need to quit drinking alcohol.

## 2023-01-25 NOTE — Progress Notes (Signed)
Ruben [name]    [id number]    [birth year]  Primary Care Physician:Brake, Resa Miner, FNP Date of Appointment: 01/25/2023 Established Patient Visit  Chief complaint:   Chief Complaint  Patient presents with   Follow-up    Pt is here for OSA F/U visit. Pt c/o SOB on exertion and Bilateral Leg Weakness.     HPI: Ruben [name] is a 72 y.o. man, non smoker, who presents with shortness of breath and proximal muscle weakness x 15 years.  Interval Updates: Here for follow up after starting cpap. Has not been using consistently. Was also hospitalized for alcohol use disorder complications twice in the last 6 weeks.  Has been using cpap about 50% of the time and can't tell a significant improvement in his breathing. Feels he didn't get much in the way of instruction when he received the device.   CPAP download reviewed 30% use. Significant leak. AHI 8.9  I have reviewed the patient's family social and past medical history and updated as appropriate.   Past Medical History:  Diagnosis Date   Alcoholism (HCC)    Alcoholism (HCC)    Aortic atherosclerosis (HCC) 12/26/2016   Noted on CT   Arthritis    BPH (benign prostatic hyperplasia)    Chronic constipation    takes magnesium   Duodenal ulcer    Dyspnea 12/17/2017   ED (erectile dysfunction)    Foley catheter in place    history of no longer in place   GERD (gastroesophageal reflux disease)    Heart palpitations    Occ   Hiatal hernia    History of adenomatous polyp of colon    tubular adenoma's 2005   History of Helicobacter pylori infection    2008   Hypercholesteremia    Hypertension    MR (mitral regurgitation)    Mild, noted on ECHO   Pre-diabetes    Pulmonary nodule 12/26/2016   noted on CT, 5 x 3 mm nodular opacity right upper lobe   Rosacea    Schatzki's ring    last dilated 02/ 2016   Urinary retention    history of   Varicocele 02/25/2014   Small to moderate left varicocele    Past Surgical  History:  Procedure Laterality Date   BALLOON DILATION N/A 06/08/2014   Procedure: BALLOON DILATION;  Surgeon: Charolett Bumpers, MD;  Location: [facility] ENDOSCOPY;  Service: Endoscopy;  Laterality: N/A;   COLONOSCOPY WITH PROPOFOL N/A 06/08/2014   Procedure: COLONOSCOPY WITH PROPOFOL;  Surgeon: Charolett Bumpers, MD;  Location: [facility] ENDOS[facility];  Service: Endoscopy;  Laterality: N/A;   ESOPHAGOGASTRODUODENOSCOPY N/A 06/08/2014   Procedure: ESOPHAGOGASTRODUODENOSCOPY (EGD);  Surgeon: Charolett Bumpers, MD;  [facility];  Service: Endoscopy;  Laterality: N/A;   ESOPHAGOGASTRODUODENOSCOPY (EGD) WITH ESOPHAGEAL DILATION N/A 12/31/2012   Procedure: ESOPHAGOGASTRODUODENOSCOPY (EGD) WITH ESOPHAGEAL DILATION;  Surgeon: Charolett Bumpers, MD;  Location: [facility];  Service: Endoscopy;  Laterality: N/A;   GREEN LIGHT LASER TURP (TRANSURETHRAL RESECTION OF PROSTATE N/A 12/28/2015   Procedure: GREEN LIGHT LASER TURP (TRANSURETHRAL RESECTION OF PROSTATE;  Surgeon: Jerilee Field, MD;  Location[facility];  Service: Urology;  Laterality: N/A;   LEFT HEART CATH AND CORONARY ANGIOGRAPHY N/A 05/16/2022   Procedure: LEFT HEART CATH AND CORONARY ANGIOGRAPHY;  Surgeon: Elder Negus, MD;  [facility] INVASIVE CV LAB;  Service: Cardiovascular;  Laterality: N/A;    Family History  Problem Relation Age of Onset  Heart failure Mother    Breast cancer Mother    Stroke Father    Other Father        Colon Issues   Lung disease Neg Hx     Social History   Occupational History   Not on file  Tobacco Use   Smoking status: Never   Smokeless tobacco: Never  Vaping Use   Vaping status: Never Used  Substance and Sexual Activity   Alcohol use: Not Currently    Comment: 10/07/2021   Drug use: Not Currently    Types: Marijuana    Comment: 1980's   Sexual activity: Not on file     Physical Exam: Blood pressure 138/84, pulse (!) 108, height 5\' 11"  (1.803 m), weight 225 lb (102.1 kg),  SpO2 96%.  Gen:      Elderly, bearded Lungs:    ctab no wheeze CV:        RRR(on auscultation), no edema   Data Reviewed: Imaging: I have personally reviewed the CT cardiac scoring - visualized lung windows wnl  Sleep study reviewed - AHI >21/hour. Desaturation down to 79%.   PFTs:     Latest Ref Rng & Units 06/30/2022    8:44 AM  PFT Results  FVC-Pre L 3.16   FVC-Predicted Pre % 68   Pre FEV1/FVC % % 77   FEV1-Pre L 2.44   FEV1-Predicted Pre % 72   DLCO uncorrected ml/min/mmHg 23.56   DLCO UNC% % 88   DLCO corrected ml/min/mmHg 22.50   DLCO COR %Predicted % 84   DLVA Predicted % 108   TLC L 4.75   TLC % Predicted % 65   RV % Predicted % 62    I have personally reviewed the patient's PFTs and mild restriction to ventilation  Labs: Lab Results  Component Value Date   WBC 8.4 12/18/2022   HGB 13.8 12/18/2022   HCT 40.4 12/18/2022   MCV 80.6 12/18/2022   PLT 152 12/18/2022   Lab Results  Component Value Date   NA 135 12/18/2022   K 3.2 (L) 12/18/2022   CL 104 12/18/2022   CO2 23 12/18/2022     Immunization status: Immunization History  Administered Date(s) Administered   Influenza Split 02/17/2009, 01/29/2012, 02/05/2014, 12/11/2014   Influenza, High Dose Seasonal PF 01/21/2019, 01/18/2022   Influenza,inj,Quad PF,6+ Mos 05/21/2020   Influenza-Unspecified 12/13/2020   Moderna Sars-Covid-2 Vaccination 03/15/2022   PFIZER(Purple Top)SARS-COV-2 Vaccination 07/10/2019, 07/31/2019, 05/21/2020   Pfizer Covid-19 Vaccine Bivalent Booster 31yrs & up 04/20/2021   Pneumococcal Polysaccharide-23 02/23/2014, 04/28/2018   Td (Adult) 05/19/2003   Tdap 10/21/2010    External Records Personally Reviewed: neurology  Assessment:  Dyspnea on exertion, most likely deconditioning.  Restriction to ventilation secondary to body habitus.  Severe OSA Alcohol   Plan/Recommendations: Advised trouble shooting cpap.  Will shave beard. Will reach out to [facility] for advice.   Has already had flu shot.   Return to Care: Return in about 6 months (around 07/26/2023).   Durel Salts, MD Pulmonary and Critical Care Medicine [facility] Office:[phone]

## 2023-01-30 ENCOUNTER — Other Ambulatory Visit: Payer: Self-pay | Admitting: Urology

## 2023-02-02 ENCOUNTER — Encounter (HOSPITAL_COMMUNITY): Payer: Self-pay | Admitting: Emergency Medicine

## 2023-02-02 ENCOUNTER — Inpatient Hospital Stay (HOSPITAL_COMMUNITY)
Admission: EM | Admit: 2023-02-02 | Discharge: 2023-02-04 | DRG: 897 | Disposition: A | Discharge status: Home / Self Care | Payer: Medicare HMO | Attending: Student | Admitting: Student

## 2023-02-02 ENCOUNTER — Other Ambulatory Visit: Payer: Self-pay

## 2023-02-02 DIAGNOSIS — R7303 Prediabetes: Secondary | ICD-10-CM | POA: Diagnosis present

## 2023-02-02 DIAGNOSIS — E876 Hypokalemia: Secondary | ICD-10-CM | POA: Diagnosis not present

## 2023-02-02 DIAGNOSIS — Z823 Family history of stroke: Secondary | ICD-10-CM | POA: Diagnosis not present

## 2023-02-02 DIAGNOSIS — Z8711 Personal history of peptic ulcer disease: Secondary | ICD-10-CM

## 2023-02-02 DIAGNOSIS — G629 Polyneuropathy, unspecified: Secondary | ICD-10-CM | POA: Diagnosis present

## 2023-02-02 DIAGNOSIS — H532 Diplopia: Secondary | ICD-10-CM | POA: Diagnosis present

## 2023-02-02 DIAGNOSIS — I10 Essential (primary) hypertension: Secondary | ICD-10-CM | POA: Diagnosis present

## 2023-02-02 DIAGNOSIS — E669 Obesity, unspecified: Secondary | ICD-10-CM | POA: Diagnosis not present

## 2023-02-02 DIAGNOSIS — I700 Atherosclerosis of aorta: Secondary | ICD-10-CM | POA: Diagnosis not present

## 2023-02-02 DIAGNOSIS — K219 Gastro-esophageal reflux disease without esophagitis: Secondary | ICD-10-CM | POA: Diagnosis present

## 2023-02-02 DIAGNOSIS — F10229 Alcohol dependence with intoxication, unspecified: Secondary | ICD-10-CM | POA: Diagnosis not present

## 2023-02-02 DIAGNOSIS — E871 Hypo-osmolality and hyponatremia: Secondary | ICD-10-CM | POA: Diagnosis present

## 2023-02-02 DIAGNOSIS — R42 Dizziness and giddiness: Secondary | ICD-10-CM | POA: Diagnosis not present

## 2023-02-02 DIAGNOSIS — N400 Enlarged prostate without lower urinary tract symptoms: Secondary | ICD-10-CM | POA: Diagnosis not present

## 2023-02-02 DIAGNOSIS — F10239 Alcohol dependence with withdrawal, unspecified: Principal | ICD-10-CM | POA: Diagnosis present

## 2023-02-02 DIAGNOSIS — Z8249 Family history of ischemic heart disease and other diseases of the circulatory system: Secondary | ICD-10-CM | POA: Diagnosis not present

## 2023-02-02 DIAGNOSIS — Z79899 Other long term (current) drug therapy: Secondary | ICD-10-CM

## 2023-02-02 DIAGNOSIS — F10930 Alcohol use, unspecified with withdrawal, uncomplicated: Principal | ICD-10-CM

## 2023-02-02 DIAGNOSIS — F10939 Alcohol use, unspecified with withdrawal, unspecified: Principal | ICD-10-CM | POA: Diagnosis present

## 2023-02-02 DIAGNOSIS — Y905 Blood alcohol level of 100-119 mg/100 ml: Secondary | ICD-10-CM | POA: Diagnosis present

## 2023-02-02 DIAGNOSIS — F32A Depression, unspecified: Secondary | ICD-10-CM | POA: Diagnosis not present

## 2023-02-02 DIAGNOSIS — Z803 Family history of malignant neoplasm of breast: Secondary | ICD-10-CM | POA: Diagnosis not present

## 2023-02-02 DIAGNOSIS — Z8489 Family history of other specified conditions: Secondary | ICD-10-CM

## 2023-02-02 DIAGNOSIS — R000 Tachycardia, unspecified: Secondary | ICD-10-CM | POA: Diagnosis not present

## 2023-02-02 DIAGNOSIS — E7800 Pure hypercholesterolemia, unspecified: Secondary | ICD-10-CM | POA: Diagnosis present

## 2023-02-02 DIAGNOSIS — Z6830 Body mass index (BMI) 30.0-30.9, adult: Secondary | ICD-10-CM | POA: Diagnosis not present

## 2023-02-02 DIAGNOSIS — R29818 Other symptoms and signs involving the nervous system: Secondary | ICD-10-CM | POA: Diagnosis not present

## 2023-02-02 DIAGNOSIS — R059 Cough, unspecified: Secondary | ICD-10-CM | POA: Diagnosis not present

## 2023-02-02 LAB — RAPID URINE DRUG SCREEN, HOSP PERFORMED
Amphetamines: NOT DETECTED
Barbiturates: NOT DETECTED
Benzodiazepines: NOT DETECTED
Cocaine: NOT DETECTED
Opiates: NOT DETECTED
Tetrahydrocannabinol: NOT DETECTED

## 2023-02-02 LAB — COMPREHENSIVE METABOLIC PANEL
ALT: 36 U/L (ref 0–44)
AST: 33 U/L (ref 15–41)
Albumin: 4.1 g/dL (ref 3.5–5.0)
Alkaline Phosphatase: 66 U/L (ref 38–126)
Anion gap: 11 (ref 5–15)
BUN: 10 mg/dL (ref 8–23)
CO2: 23 mmol/L (ref 22–32)
Calcium: 9.1 mg/dL (ref 8.9–10.3)
Chloride: 104 mmol/L (ref 98–111)
Creatinine, Ser: 0.7 mg/dL (ref 0.61–1.24)
GFR, Estimated: >60 mL/min (ref >60)
Glucose, Bld: 112 mg/dL — ABNORMAL HIGH (ref 70–99)
Potassium: 3.4 mmol/L — ABNORMAL LOW (ref 3.5–5.1)
Sodium: 138 mmol/L (ref 135–145)
Total Bilirubin: 1 mg/dL (ref 0.3–1.2)
Total Protein: 7.5 g/dL (ref 6.5–8.1)

## 2023-02-02 LAB — CBC WITH DIFFERENTIAL/PLATELET
Abs Immature Granulocytes: 0.06 10*3/uL (ref 0.00–0.07)
Basophils Absolute: 0.1 10*3/uL (ref 0.0–0.1)
Basophils Relative: 1 %
Eosinophils Absolute: 0 10*3/uL (ref 0.0–0.5)
Eosinophils Relative: 0 %
HCT: 51.2 % (ref 39.0–52.0)
Hemoglobin: 16.5 g/dL (ref 13.0–17.0)
Immature Granulocytes: 1 %
Lymphocytes Relative: 14 %
Lymphs Abs: 1.6 10*3/uL (ref 0.7–4.0)
MCH: 28.8 pg (ref 26.0–34.0)
MCHC: 32.2 g/dL (ref 30.0–36.0)
MCV: 89.4 fL (ref 80.0–100.0)
Monocytes Absolute: 0.4 10*3/uL (ref 0.1–1.0)
Monocytes Relative: 4 %
Neutro Abs: 9 10*3/uL — ABNORMAL HIGH (ref 1.7–7.7)
Neutrophils Relative %: 80 %
Platelets: 222 10*3/uL (ref 150–400)
RBC: 5.73 MIL/uL (ref 4.22–5.81)
RDW: 17.1 % — ABNORMAL HIGH (ref 11.5–15.5)
WBC: 11.1 10*3/uL — ABNORMAL HIGH (ref 4.0–10.5)
nRBC: 0 % (ref 0.0–0.2)

## 2023-02-02 LAB — MRSA NEXT GEN BY PCR, NASAL: MRSA by PCR Next Gen: NOT DETECTED

## 2023-02-02 LAB — ETHANOL: Alcohol, Ethyl (B): 100 mg/dL — ABNORMAL HIGH (ref <10)

## 2023-02-02 LAB — LIPASE, BLOOD: Lipase: 22 U/L (ref 11–51)

## 2023-02-02 MED ORDER — METOPROLOL TARTRATE 50 MG PO TABS
50.0000 mg | ORAL_TABLET | Freq: Two times a day (BID) | ORAL | Status: DC
  Administered 2023-02-02 – 2023-02-04 (×4): 50 mg via ORAL
  Filled 2023-02-02: qty 2
  Filled 2023-02-02: qty 1
  Filled 2023-02-02: qty 2
  Filled 2023-02-02: qty 1

## 2023-02-02 MED ORDER — BUPROPION HCL ER (XL) 150 MG PO TB24: 150.0000 mg | ORAL_TABLET | Freq: Every morning | ORAL | Status: DC

## 2023-02-02 MED ORDER — ONDANSETRON HCL 4 MG/2ML IJ SOLN
4.0000 mg | Freq: Once | INTRAMUSCULAR | Status: AC
  Administered 2023-02-02: 4 mg via INTRAVENOUS
  Filled 2023-02-02: qty 2

## 2023-02-02 MED ORDER — LORAZEPAM 2 MG/ML IJ SOLN
1.0000 mg | Freq: Once | INTRAMUSCULAR | Status: AC
  Administered 2023-02-02: 1 mg via INTRAVENOUS
  Filled 2023-02-02: qty 1

## 2023-02-02 MED ORDER — THIAMINE MONONITRATE 100 MG PO TABS
100.0000 mg | ORAL_TABLET | Freq: Every day | ORAL | Status: DC
  Administered 2023-02-03 – 2023-02-04 (×2): 100 mg via ORAL
  Filled 2023-02-02 (×3): qty 1

## 2023-02-02 MED ORDER — ONDANSETRON HCL 4 MG PO TABS: 4.0000 mg | ORAL_TABLET | Freq: Four times a day (QID) | ORAL | Status: DC | PRN

## 2023-02-02 MED ORDER — LACTATED RINGERS IV BOLUS
1000.0000 mL | Freq: Once | INTRAVENOUS | Status: AC
Start: 2023-02-02 — End: 2023-02-02
  Administered 2023-02-02: 1000 mL via INTRAVENOUS

## 2023-02-02 MED ORDER — GABAPENTIN 300 MG PO CAPS
300.0000 mg | ORAL_CAPSULE | Freq: Every day | ORAL | Status: DC
  Administered 2023-02-02 – 2023-02-03 (×2): 300 mg via ORAL
  Filled 2023-02-02 (×2): qty 1

## 2023-02-02 MED ORDER — ROSUVASTATIN CALCIUM 10 MG PO TABS: 20.0000 mg | ORAL_TABLET | ORAL | Status: DC

## 2023-02-02 MED ORDER — THIAMINE MONONITRATE 100 MG PO TABS: 100.0000 mg | ORAL_TABLET | Freq: Every day | ORAL | Status: DC

## 2023-02-02 MED ORDER — LORAZEPAM 2 MG/ML IJ SOLN
2.0000 mg | Freq: Once | INTRAMUSCULAR | Status: DC
  Filled 2023-02-02: qty 1

## 2023-02-02 MED ORDER — SENNOSIDES-DOCUSATE SODIUM 8.6-50 MG PO TABS
1.0000 | ORAL_TABLET | Freq: Every evening | ORAL | Status: DC | PRN
Start: 2023-02-02 — End: 2023-02-04

## 2023-02-02 MED ORDER — ALBUTEROL SULFATE (2.5 MG/3ML) 0.083% IN NEBU: 2.5000 mg | INHALATION_SOLUTION | RESPIRATORY_TRACT | Status: DC | PRN

## 2023-02-02 MED ORDER — LORAZEPAM 2 MG/ML IJ SOLN: 0.0000 mg | Freq: Two times a day (BID) | INTRAMUSCULAR | Status: DC

## 2023-02-02 MED ORDER — ONDANSETRON HCL 4 MG/2ML IJ SOLN: 4.0000 mg | Freq: Four times a day (QID) | INTRAMUSCULAR | Status: DC | PRN

## 2023-02-02 MED ORDER — ENOXAPARIN SODIUM 40 MG/0.4ML IJ SOSY
40.0000 mg | PREFILLED_SYRINGE | INTRAMUSCULAR | Status: DC
  Administered 2023-02-02 – 2023-02-04 (×3): 40 mg via SUBCUTANEOUS
  Filled 2023-02-02 (×3): qty 0.4

## 2023-02-02 MED ORDER — METOPROLOL TARTRATE 25 MG PO TABS
50.0000 mg | ORAL_TABLET | Freq: Once | ORAL | Status: AC
  Administered 2023-02-02: 50 mg via ORAL
  Filled 2023-02-02: qty 2

## 2023-02-02 MED ORDER — ACETAMINOPHEN 650 MG RE SUPP: 650.0000 mg | Freq: Four times a day (QID) | RECTAL | Status: DC | PRN

## 2023-02-02 MED ORDER — HYDRALAZINE HCL 20 MG/ML IJ SOLN
5.0000 mg | Freq: Four times a day (QID) | INTRAMUSCULAR | Status: DC | PRN
  Administered 2023-02-02 – 2023-02-03 (×2): 5 mg via INTRAVENOUS
  Filled 2023-02-02 (×2): qty 1

## 2023-02-02 MED ORDER — SODIUM CHLORIDE 0.9 % IV BOLUS
1000.0000 mL | Freq: Once | INTRAVENOUS | Status: AC
  Administered 2023-02-02: 1000 mL via INTRAVENOUS

## 2023-02-02 MED ORDER — LORAZEPAM 1 MG PO TABS: 0.0000 mg | ORAL_TABLET | ORAL | Status: DC

## 2023-02-02 MED ORDER — THIAMINE HCL 100 MG/ML IJ SOLN
100.0000 mg | Freq: Every day | INTRAMUSCULAR | Status: DC
  Administered 2023-02-02: 100 mg via INTRAVENOUS
  Filled 2023-02-02: qty 2

## 2023-02-02 MED ORDER — LORAZEPAM 1 MG PO TABS: 0.0000 mg | ORAL_TABLET | Freq: Two times a day (BID) | ORAL | Status: DC

## 2023-02-02 MED ORDER — LORAZEPAM 2 MG/ML IJ SOLN
0.0000 mg | Freq: Four times a day (QID) | INTRAMUSCULAR | Status: AC
  Administered 2023-02-02: 2 mg via INTRAVENOUS
  Administered 2023-02-02: 1 mg via INTRAVENOUS
  Administered 2023-02-03: 2 mg via INTRAVENOUS
  Filled 2023-02-02 (×2): qty 1

## 2023-02-02 MED ORDER — ADULT MULTIVITAMIN W/MINERALS CH
1.0000 | ORAL_TABLET | Freq: Every day | ORAL | Status: DC
  Administered 2023-02-02 – 2023-02-04 (×3): 1 via ORAL
  Filled 2023-02-02 (×3): qty 1

## 2023-02-02 MED ORDER — TAMSULOSIN HCL 0.4 MG PO CAPS
0.4000 mg | ORAL_CAPSULE | Freq: Every day | ORAL | Status: DC
  Administered 2023-02-02 – 2023-02-03 (×2): 0.4 mg via ORAL
  Filled 2023-02-02 (×2): qty 1

## 2023-02-02 MED ORDER — FOLIC ACID 1 MG PO TABS
1.0000 mg | ORAL_TABLET | Freq: Every day | ORAL | Status: DC
  Administered 2023-02-02 – 2023-02-04 (×3): 1 mg via ORAL
  Filled 2023-02-02 (×3): qty 1

## 2023-02-02 MED ORDER — LORAZEPAM 1 MG PO TABS
0.0000 mg | ORAL_TABLET | Freq: Four times a day (QID) | ORAL | Status: AC
  Administered 2023-02-02: 2 mg via ORAL
  Administered 2023-02-03: 1 mg via ORAL
  Filled 2023-02-02: qty 1
  Filled 2023-02-02: qty 2

## 2023-02-02 MED ORDER — ACETAMINOPHEN 325 MG PO TABS: 650.0000 mg | ORAL_TABLET | Freq: Four times a day (QID) | ORAL | Status: DC | PRN

## 2023-02-02 MED ORDER — LORAZEPAM 1 MG PO TABS: 1.0000 mg | ORAL_TABLET | ORAL | Status: DC | PRN

## 2023-02-02 MED ORDER — LORAZEPAM 2 MG/ML IJ SOLN
1.0000 mg | INTRAMUSCULAR | Status: DC | PRN
  Filled 2023-02-02: qty 1

## 2023-02-02 MED ORDER — LORAZEPAM 1 MG PO TABS: 0.0000 mg | ORAL_TABLET | Freq: Three times a day (TID) | ORAL | Status: DC

## 2023-02-02 MED ORDER — THIAMINE HCL 100 MG/ML IJ SOLN: 100.0000 mg | Freq: Every day | INTRAMUSCULAR | Status: DC

## 2023-02-02 MED ORDER — CHLORDIAZEPOXIDE HCL 25 MG PO CAPS
25.0000 mg | ORAL_CAPSULE | Freq: Once | ORAL | Status: AC
  Administered 2023-02-02: 25 mg via ORAL
  Filled 2023-02-02: qty 1

## 2023-02-02 MED ORDER — CHLORHEXIDINE GLUCONATE CLOTH 2 % EX PADS
6.0000 | MEDICATED_PAD | Freq: Every day | CUTANEOUS | Status: DC
  Administered 2023-02-02 – 2023-02-03 (×2): 6 via TOPICAL

## 2023-02-02 NOTE — ED Notes (Signed)
Aware of need for urine sample, urinal at bedside 

## 2023-02-02 NOTE — ED Provider Notes (Signed)
Patient care transferred to me.  Unfortunately patient is still tachycardic and a little tremulous.  He does feel better when he first got here but with his significant tachycardia (which seems sinus) I think he will need admission.  Will give further fluids and admit him to the hospital.  I think at least part of this is he has not taken his metoprolol while he has been having the dry heaving and so I will give him a dose now and continue to monitor him.  I think he will need admission and I discussed with Dr. Kirby Crigler.  He does appear to have somewhat avoid withdrawal component but currently has minimal symptoms besides some mild tremor.  It likely is a mixed picture.   Pricilla Loveless, MD 02/02/23 [phone]

## 2023-02-02 NOTE — Care Management (Signed)
Transition of Care [facility]) - Inpatient Brief Assessment   Patient Details  Name: Ruben [name] MRN: [id number] Date of Birth: [birth year]  Transition of Care [facility]) CM/SW Contact:    Lavenia Atlas, RN Phone Number: 02/02/2023, 6:26 PM   Clinical Narrative: Per chart review patient currently in [facility] SDU for alcohol withdrawal. Received TOC consult for SA resources, attached to AVS.   TOC following for discharge needs.   Transition of Care Asessment: Insurance and Status: Insurance coverage has been reviewed Patient has primary care physician: Yes Home environment has been reviewed: from home alone Prior level of function:: independent Prior/Current Home Services: No current home services Social Determinants of Health Reivew: SDOH reviewed no interventions necessary Readmission risk has been reviewed: Yes Transition of care needs: no transition of care needs at this time

## 2023-02-02 NOTE — ED Triage Notes (Signed)
Pt BIBA from home presenting with cough/dry heave, pt has hx of ETOH abuse, pt reports last drink was Wednesday. EMS found empty bottle of vodka found on pt's couch at home. Pt ST 145 bpm on cardiac monitor. A&Ox4.

## 2023-02-02 NOTE — Plan of Care (Signed)
  Problem: Nutrition: Goal: Adequate nutrition will be maintained Outcome: Progressing   Problem: Coping: Goal: Level of anxiety will decrease Outcome: Progressing   

## 2023-02-02 NOTE — ED Notes (Signed)
ED TO INPATIENT HANDOFF REPORT  Name/Age/Gender Ruben [name] 72 y.o. male  Code Status Code Status History     Date Active Date Inactive Code Status Order ID Comments User Context   12/17/2022 1123 12/19/2022 1645 Full Code [id number]  Lanier Clam, NP ED   05/16/2022 0834 05/16/2022 1633 Full Code [id number]  Elder Negus, MD Inpatient   09/08/2020 2343 09/14/2020 1644 Full Code [id number]  Briscoe Deutscher, MD ED   06/23/2020 0017 06/28/2020 1620 Full Code [id number]  Briscoe Deutscher, MD ED   06/22/2020 1508 06/23/2020 0017 Full Code [id number]  Alvira Monday, MD ED   04/03/2020 2039 04/08/2020 2132 Full Code [id number]  Charlsie Quest, MD ED   12/22/2019 0754 12/30/2019 1916 Full Code [id number]  Lucile Shutters, MD ED   10/01/2019 0613 10/06/2019 2141 Full Code [id number]  Eduard Clos, MD ED   06/21/2019 2150 06/27/2019 2228 Full Code [id number]  Rometta Emery, MD Inpatient   04/26/2018 1228 04/28/2018 1851 Full Code [id number]  Axel Filler, MD Inpatient    Questions for Most Recent Historical Code Status (Order [id number])     Question Answer   By: Default: patient does not have capacity for decision making, no surrogate or prior directive available            Home/SNF/Other Home  Chief Complaint Alcohol withdrawal (HCC) [F10.939]  Level of Care/Admitting Diagnosis ED Disposition     ED Disposition  Admit   Condition  --   Comment  [facility]ospital [facility] [[id number]]  Level of Care: Stepdown [14]  Admit to SDU based on following criteria: Severe physiological/psychological symptoms:  Any diagnosis requiring assessment & intervention at least every 4 hours on an ongoing basis to obtain desired patient outcomes including stability and rehabilitation  May [facility] patient to [facility] or [facility] if equivalent level of care is available:: Yes  Covid Evaluation: Asymptomatic - no recent exposure (last 10 days) testing not required  Diagnosis:  Alcohol withdrawal (HCC) [291.81.ICD-9-CM]  Admitting Physician: Maryln Gottron [[id number]]  Attending Physician: Kirby Crigler, MIR [place].Roy [[id number]]  Certification:: I certify this patient will need inpatient services for at least 2 midnights  Expected Medical Readiness: 02/05/2023          Medical History Past Medical History:  Diagnosis Date   Alcoholism (HCC)    Alcoholism (HCC)    Aortic atherosclerosis (HCC) 12/26/2016   Noted on CT   Arthritis    BPH (benign prostatic hyperplasia)    Chronic constipation    takes magnesium   Duodenal ulcer    Dyspnea 12/17/2017   ED (erectile dysfunction)    Foley catheter in place    history of no longer in place   GERD (gastroesophageal reflux disease)    Heart palpitations    Occ   Hiatal hernia    History of adenomatous polyp of colon    tubular adenoma's 2005   History of Helicobacter pylori infection    2008   Hypercholesteremia    Hypertension    MR (mitral regurgitation)    Mild, noted on ECHO   Pre-diabetes    Pulmonary nodule 12/26/2016   noted on CT, 5 x 3 mm nodular opacity right upper lobe   Rosacea    Schatzki's ring    last dilated 02/ 2016   Urinary retention    history of   Varicocele 02/25/2014   Small to moderate left varicocele    Allergies No Known  Allergies  IV Location/Drains/Wounds Patient Lines/Drains/Airways Status     Active Line/Drains/Airways     Name Placement date Placement time Site Days   Peripheral IV 02/02/23 20 G Posterior;Right Hand 02/02/23  0458  Hand  less than 1            Labs/Imaging Results for orders placed or performed during the hospital encounter of 02/02/23 (from the past 48 hour(s))  CBC with Differential     Status: Abnormal   Collection Time: 02/02/23  4:45 AM  Result Value Ref Range   WBC 11.1 (H) 4.0 - 10.5 K/uL   RBC 5.73 4.22 - 5.81 MIL/uL   Hemoglobin 16.5 13.0 - 17.0 g/dL   HCT 21.3 08.6 - 57.8 %   MCV 89.4 80.0 - 100.0 fL   MCH 28.8 26.0 - 34.0 pg    MCHC 32.2 30.0 - 36.0 g/dL   RDW 46.9 (H) 62.9 - 52.8 %   Platelets 222 150 - 400 K/uL   nRBC 0.0 0.0 - 0.2 %   Neutrophils Relative % 80 %   Neutro Abs 9.0 (H) 1.7 - 7.7 K/uL   Lymphocytes Relative 14 %   Lymphs Abs 1.6 0.7 - 4.0 K/uL   Monocytes Relative 4 %   Monocytes Absolute 0.4 0.1 - 1.0 K/uL   Eosinophils Relative 0 %   Eosinophils Absolute 0.0 0.0 - 0.5 K/uL   Basophils Relative 1 %   Basophils Absolute 0.1 0.0 - 0.1 K/uL   Immature Granulocytes 1 %   Abs Immature Granulocytes 0.06 0.00 - 0.07 K/uL    Comment: Performed at [facility], [address] [place], [place] [address]  Comprehensive metabolic panel     Status: Abnormal   Collection Time: 02/02/23  4:45 AM  Result Value Ref Range   Sodium 138 135 - 145 mmol/L    Comment: Electrolytes repeated to confirm. CORRECTED ON 10/25 AT 0538: PREVIOUSLY REPORTED AS 140    Potassium 3.4 (L) 3.5 - 5.1 mmol/L    Comment: Electrolytes repeated to confirm. CORRECTED ON 10/25 AT 0538: PREVIOUSLY REPORTED AS 4.5    Chloride 104 98 - 111 mmol/L    Comment: Electrolytes repeated to confirm. CORRECTED ON 10/25 AT 0538: PREVIOUSLY REPORTED AS 103    CO2 23 22 - 32 mmol/L    Comment: Electrolytes repeated to confirm. CORRECTED ON 10/25 AT 0538: PREVIOUSLY REPORTED AS 13    Glucose, Bld 112 (H) 70 - 99 mg/dL    Comment: Glucose reference range applies only to samples taken after fasting for at least 8 hours. CORRECTED ON 10/25 AT 0538: PREVIOUSLY REPORTED AS 92 Glucose reference range applies only to samples taken after fasting for at least 8 hours.    BUN 10 8 - 23 mg/dL    Comment: CORRECTED ON 10/25 AT 0538: PREVIOUSLY REPORTED AS 13   Creatinine, Ser 0.70 0.61 - 1.24 mg/dL    Comment: CORRECTED ON 10/25 AT 0538: PREVIOUSLY REPORTED AS 1.18   Calcium 9.1 8.9 - 10.3 mg/dL    Comment: Electrolytes repeated to confirm. CORRECTED ON 10/25 AT 0538: PREVIOUSLY REPORTED AS 8.6    Total Protein 7.5 6.5 -  8.1 g/dL   Albumin 4.1 3.5 - 5.0 g/dL   AST 33 15 - 41 U/L   ALT 36 0 - 44 U/L   Alkaline Phosphatase 66 38 - 126 U/L   Total Bilirubin 1.0 0.3 - 1.2 mg/dL   GFR, Estimated >40 >10 mL/min  Comment: (NOTE) Calculated using the CKD-EPI Creatinine Equation (2021)    Anion gap 11 5 - 15    Comment: Performed at [facility], [address] [place], [place] [address] CORRECTED ON 10/25 AT 6789: PREVIOUSLY REPORTED AS 24   Ethanol     Status: Abnormal   Collection Time: 02/02/23  4:45 AM  Result Value Ref Range   Alcohol, Ethyl (B) 100 (H) <10 mg/dL    Comment: (NOTE) Lowest detectable limit for serum alcohol is 10 mg/dL.  For medical purposes only. Performed at [facility], [address], [place], [place] [address]   Lipase, blood     Status: None   Collection Time: 02/02/23  4:45 AM  Result Value Ref Range   Lipase 22 11 - 51 U/L    Comment: Performed at [facility], [address] [place], [place] [address]   No results found.  Pending Labs Unresulted Labs (From admission, onward)     Start     Ordered   02/02/23 0459  Rapid urine drug screen (hospital performed)  ONCE - STAT,   STAT        02/02/23 0458            Vitals/Pain Today's Vitals   02/02/23 0715 02/02/23 0730 02/02/23 0745 02/02/23 0813  BP: (!) 143/96 (!) 128/93 (!) 152/99 (!) 141/109  Pulse: (!) 125 (!) 124 (!) 138 (!) 137  Resp: 20 20 19    Temp:      TempSrc:      SpO2: 96% 95% 98%   PainSc:        Isolation Precautions No active isolations  Medications Medications  LORazepam (ATIVAN) injection 0-4 mg (2 mg Intravenous Given 02/02/23 0505)    Or  LORazepam (ATIVAN) tablet 0-4 mg ( Oral See Alternative 02/02/23 0505)  LORazepam (ATIVAN) injection 0-4 mg (has no administration in time range)    Or  LORazepam (ATIVAN) tablet 0-4 mg (has no administration in time range)  thiamine (VITAMIN B1) tablet 100 mg ( Oral See  Alternative 02/02/23 0806)    Or  thiamine (VITAMIN B1) injection 100 mg (100 mg Intravenous Given 02/02/23 0806)  LORazepam (ATIVAN) tablet 1-4 mg (has no administration in time range)    Or  LORazepam (ATIVAN) injection 1-4 mg (has no administration in time range)  folic acid (FOLVITE) tablet 1 mg (has no administration in time range)  multivitamin with minerals tablet 1 tablet (has no administration in time range)  LORazepam (ATIVAN) tablet 0-4 mg (has no administration in time range)    Followed by  LORazepam (ATIVAN) tablet 0-4 mg (has no administration in time range)  ondansetron (ZOFRAN) injection 4 mg (4 mg Intravenous Given 02/02/23 0505)  sodium chloride 0.9 % bolus 1,000 mL (0 mLs Intravenous Stopped 02/02/23 0608)  chlordiazePOXIDE (LIBRIUM) capsule 25 mg (25 mg Oral Given 02/02/23 0637)  LORazepam (ATIVAN) injection 1 mg (1 mg Intravenous Given 02/02/23 0659)  lactated ringers bolus 1,000 mL (1,000 mLs Intravenous New Bag/Given 02/02/23 0806)  metoprolol tartrate (LOPRESSOR) tablet 50 mg (50 mg Oral Given 02/02/23 0809)    Mobility walks

## 2023-02-02 NOTE — H&P (Signed)
History and Physical  Ruben [name] ZOX:[id number] DOB: 04/29/1950 DOA: 02/02/2023  PCP: Soundra Pilon, FNP   Chief Complaint: Dry heaving, shakes  HPI: Ruben [name] is a 72 y.o. male with medical history significant for prediabetes, hypertension, hyper lipidemia, GERD, BPH, alcohol abuse who reports that he drinks up to a liter of vodka a day and is now being admitted to the hospital with alcohol withdrawal.  Patient states that he wants to quit drinking which he has tried to do several times in the past.  He has never had alcohol withdrawal seizure.  States that he last drank either yesterday or the day before.  Early morning hours today, he started having dry heaving, and feeling shaky.  He recognized these symptoms of alcohol withdrawal and started to come to the ER for evaluation.  Denies any fevers, chest pain, vomiting, diarrhea, abdominal pain.  ED Course: Here in the emergency department he was afebrile, initially found to be tachycardic 143 blood pressure 132/120.  Saturating well on room air.  Told ER provider that he has not been able to keep down his home metoprolol.  This was given to him, and his heart rate has improved to 111.  Also received IV Ativan and says that he feels less shaky.  Review of Systems: Please see HPI for pertinent positives and negatives. A complete 10 system review of systems are otherwise negative.  Past Medical History:  Diagnosis Date   Alcoholism (HCC)    Alcoholism (HCC)    Aortic atherosclerosis (HCC) 12/26/2016   Noted on CT   Arthritis    BPH (benign prostatic hyperplasia)    Chronic constipation    takes magnesium   Duodenal ulcer    Dyspnea 12/17/2017   ED (erectile dysfunction)    Foley catheter in place    history of no longer in place   GERD (gastroesophageal reflux disease)    Heart palpitations    Occ   Hiatal hernia    History of adenomatous polyp of colon    tubular adenoma's 2005   History of Helicobacter pylori infection     2008   Hypercholesteremia    Hypertension    MR (mitral regurgitation)    Mild, noted on ECHO   Pre-diabetes    Pulmonary nodule 12/26/2016   noted on CT, 5 x 3 mm nodular opacity right upper lobe   Rosacea    Schatzki's ring    last dilated 02/ 2016   Urinary retention    history of   Varicocele 02/25/2014   Small to moderate left varicocele   Past Surgical History:  Procedure Laterality Date   BALLOON DILATION N/A 06/08/2014   Procedure: BALLOON DILATION;  Surgeon: Charolett Bumpers, MD;  Location: [facility];  Service: Endoscopy;  Laterality: N/A;   COLONOSCOPY WITH PROPOFOL N/A 06/08/2014   Procedure: COLONOSCOPY WITH PROPOFOL;  Surgeon: Charolett Bumpers, MD;  Location: [facility];  Service: Endoscopy;  Laterality: N/A;   ESOPHAGOGASTRODUODENOSCOPY N/A 06/08/2014   Procedure: ESOPHAGOGASTRODUODENOSCOPY (EGD);  Surgeon: Charolett Bumpers, MD;  Location: [facility];  Service: Endoscopy;  Laterality: N/A;   ESOPHAGOGASTRODUODENOSCOPY (EGD) WITH ESOPHAGEAL DILATION N/A 12/31/2012   Procedure: ESOPHAGOGASTRODUODENOSCOPY (EGD) WITH ESOPHAGEAL DILATION;  Surgeon: Charolett Bumpers, MD;  Location: [facility];  Service: Endoscopy;  Laterality: N/A;   GREEN LIGHT LASER TURP (TRANSURETHRAL RESECTION OF PROSTATE N/A 12/28/2015   Procedure: GREEN LIGHT LASER TURP (TRANSURETHRAL RESECTION OF PROSTATE;  Surgeon: Jerilee Field, MD;  Location: [facility];  Service: Urology;  Laterality: N/A;   LEFT HEART CATH AND CORONARY ANGIOGRAPHY N/A 05/16/2022   Procedure: LEFT HEART CATH AND CORONARY ANGIOGRAPHY;  Surgeon: Elder Negus, MD;  Location: [facility];  Service: Cardiovascular;  Laterality: N/A;    Social History:  reports that he has never smoked. He has never used smokeless tobacco. He reports current alcohol use of about 154.0 standard drinks of alcohol per week. He reports that he does not currently use drugs after having used the following drugs:  Marijuana.   No Known Allergies  Family History  Problem Relation Age of Onset   Heart failure Mother    Breast cancer Mother    Stroke Father    Other Father        Colon Issues   Lung disease Neg Hx      Prior to Admission medications   Medication Sig Start Date End Date Taking? Authorizing Provider  buPROPion (WELLBUTRIN XL) 150 MG 24 hr tablet Take 150 mg by mouth every morning. 05/02/22   [provider]  cloNIDine (CATAPRES) 0.1 MG tablet Take 0.1 mg by mouth at bedtime.    [provider]  finasteride (PROSCAR) 5 MG tablet Take 5 mg by mouth daily.    [provider]  gabapentin (NEURONTIN) 300 MG capsule Take 300 mg by mouth at bedtime.    [provider]  hydrOXYzine (ATARAX) 50 MG tablet Take 50 mg by mouth at bedtime. 09/07/21   [provider]  ibuprofen (ADVIL) 200 MG tablet Take 400 mg by mouth every 6 (six) hours as needed for moderate pain.    [provider]  magnesium hydroxide (MILK OF MAGNESIA) 400 MG/5ML suspension Take 30 mLs by mouth every other day.    [provider]  methocarbamol (ROBAXIN) 500 MG tablet Take 500 mg by mouth every 4 (four) hours as needed for muscle spasms. 03/10/21   [provider]  metoprolol tartrate (LOPRESSOR) 50 MG tablet Take 50 mg by mouth 2 (two) times daily.    [provider]  QUEtiapine (SEROQUEL) 300 MG tablet Take 300 mg by mouth at bedtime. 12/02/22   [provider]  QUEtiapine Fumarate (SEROQUEL XR) 150 MG 24 hr tablet Take 150 mg by mouth at bedtime.    [provider]  rosuvastatin (CRESTOR) 20 MG tablet Take 20 mg by mouth every Monday, Wednesday, and Friday. 08/23/21   [provider]  senna-docusate (SENOKOT-S) 8.6-50 MG tablet Take 1 tablet by mouth daily as needed for mild constipation.    [provider]  tamsulosin (FLOMAX) 0.4 MG CAPS capsule Take 0.4 mg by mouth at bedtime.    [provider]     Physical Exam: BP (!) 141/109   Pulse (!) 137   Temp 98.1 F (36.7 C) (Oral)   Resp 19   SpO2 98%   General:  Alert, orientedx4, pleasant and cooperative, slightly anxious in appearance, but no tremor, in no acute distress  Eyes: EOMI, clear conjuctivae, white sclerea Neck: supple, no masses, trachea mildline  Cardiovascular: RRR, no murmurs or rubs, no peripheral edema  Respiratory: clear to auscultation bilaterally, no wheezes, no crackles  Abdomen: soft, nontender, slightly distended, normal bowel tones heard  Skin: dry, no rashes  Musculoskeletal: no joint effusions, normal range of motion  Psychiatric: appropriate affect, normal speech  Neurologic: extraocular muscles intact, clear speech, moving all extremities with intact sensorium         Labs  on Admission:  Basic Metabolic Panel: Recent Labs  Lab 02/02/23 0445  NA 138  K 3.4*  CL 104  CO2 23  GLUCOSE 112*  BUN 10  CREATININE 0.70  CALCIUM 9.1   Liver Function Tests: Recent Labs  Lab 02/02/23 0445  AST 33  ALT 36  ALKPHOS 66  BILITOT 1.0  PROT 7.5  ALBUMIN 4.1   Recent Labs  Lab 02/02/23 0445  LIPASE 22   No results for input(s): "AMMONIA" in the last 168 hours. CBC: Recent Labs  Lab 02/02/23 0445  WBC 11.1*  NEUTROABS 9.0*  HGB 16.5  HCT 51.2  MCV 89.4  PLT 222   Cardiac Enzymes: No results for input(s): "CKTOTAL", "CKMB", "CKMBINDEX", "TROPONINI" in the last 168 hours.  BNP (last 3 results) No results for input(s): "BNP" in the last 8760 hours.  ProBNP (last 3 results) No results for input(s): "PROBNP" in the last 8760 hours.  CBG: No results for input(s): "GLUCAP" in the last 168 hours.  Radiological Exams on Admission: No results found.  Assessment/Plan CHIDUBEM PAE is a 72 y.o. male with medical history significant for prediabetes, hypertension, hyper lipidemia, GERD, BPH, alcohol abuse who reports that he drinks up to a liter of vodka a day and is now being admitted  to the hospital with alcohol withdrawal.    Alcohol withdrawal-with last drink of alcohol within the last 48 hours, note he still has EtOH level of 100.  Some tremors upon arrival, and tachycardia likely also triggering factor is that he has not kept on his metoprolol at home.-Given history of heavy drinking, and withdrawal symptoms with his present alcohol level, I am concerned about severe withdrawal. -Inpatient admission to stepdown -Scheduled and as needed Ativan per CIWA protocol -Continuous cardiac monitoring -Thiamine, folate, multivitamin daily -TOC consult  Prediabetes-regular diet, blood sugar is not elevated, consider ARB controlled diet and sliding scale if significant hyperglycemia  Hypertension-continue home metoprolol, add IV hydralazine as needed  Peripheral neuropathy-gabapentin at bedtime  Depression-Wellbutrin  BPH-Flomax   DVT prophylaxis: Lovenox     Code Status: Full Code  Consults called: None  Admission status: The appropriate patient status for this patient is INPATIENT. Inpatient status is judged to be reasonable and necessary in order to provide the required intensity of service to ensure the patient's safety. The patient's presenting symptoms, physical exam findings, and initial radiographic and laboratory data in the context of their chronic comorbidities is felt to place them at high risk for further clinical deterioration. Furthermore, it is not anticipated that the patient will be medically stable for discharge from the hospital within 2 midnights of admission.    I certify that at the point of admission it is my clinical judgment that the patient will require inpatient hospital care spanning beyond 2 midnights from the point of admission due to high intensity of service, high risk for further deterioration and high frequency of surveillance required  Time spent: 59 minutes  Deangleo Passage Sharlette Dense MD [facility] Pager [phone]  If 7PM-7AM,  please contact night-coverage [email or web address] Password [facility]  02/02/2023, 8:53 AM

## 2023-02-02 NOTE — ED Provider Notes (Signed)
[place] EMERGENCY DEPARTMENT AT [facility] Provider Note   CSN: [id number] Arrival date & time: 02/02/23  0422     History {Add pertinent medical, surgical, social history, OB history to HPI:1} Chief Complaint  Patient presents with   Withdrawal   Alcohol Intoxication    JAASIEL HURLBUTT is a 72 y.o. male.  HPI     This is a 72 year old male who presents with concern for alcohol withdrawal.  Patient reports that he normally is a daily drinker.  He drinks about a liter of liquor a day.  Last drink was Wednesday night around 7 PM.  Patient reports nausea and vomiting as well as tremors.  He has a history of alcohol withdrawal but no alcohol withdrawal seizures.  Denies any other illicit drug use.  Initial CIWA score 11.  Home Medications Prior to Admission medications   Medication Sig Start Date End Date Taking? Authorizing Provider  buPROPion (WELLBUTRIN XL) 150 MG 24 hr tablet Take 150 mg by mouth every morning. 05/02/22   [provider]  cloNIDine (CATAPRES) 0.1 MG tablet Take 0.1 mg by mouth at bedtime.    [provider]  finasteride (PROSCAR) 5 MG tablet Take 5 mg by mouth daily.    [provider]  gabapentin (NEURONTIN) 300 MG capsule Take 300 mg by mouth at bedtime.    [provider]  hydrOXYzine (ATARAX) 50 MG tablet Take 50 mg by mouth at bedtime. 09/07/21   [provider]  ibuprofen (ADVIL) 200 MG tablet Take 400 mg by mouth every 6 (six) hours as needed for moderate pain.    [provider]  magnesium hydroxide (MILK OF MAGNESIA) 400 MG/5ML suspension Take 30 mLs by mouth every other day.    [provider]  methocarbamol (ROBAXIN) 500 MG tablet Take 500 mg by mouth every 4 (four) hours as needed for muscle spasms. 03/10/21   [provider]  metoprolol tartrate (LOPRESSOR) 50 MG tablet Take 50 mg by mouth 2 (two) times daily.    [provider]  QUEtiapine (SEROQUEL) 300 MG  tablet Take 300 mg by mouth at bedtime. 12/02/22   [provider]  QUEtiapine Fumarate (SEROQUEL XR) 150 MG 24 hr tablet Take 150 mg by mouth at bedtime.    [provider]  rosuvastatin (CRESTOR) 20 MG tablet Take 20 mg by mouth every Monday, Wednesday, and Friday. 08/23/21   [provider]  senna-docusate (SENOKOT-S) 8.6-50 MG tablet Take 1 tablet by mouth daily as needed for mild constipation.    [provider]  tamsulosin (FLOMAX) 0.4 MG CAPS capsule Take 0.4 mg by mouth at bedtime.    [provider]      Allergies    Patient has no known allergies.    Review of Systems   Review of Systems  Constitutional:  Negative for fever.  Respiratory:  Negative for shortness of breath.   Cardiovascular:  Negative for chest pain.  Gastrointestinal:  Positive for nausea and vomiting. Negative for abdominal pain.  All other systems reviewed and are negative.   Physical Exam Updated Vital Signs BP 127/79   Pulse (!) 148   Temp 98.4 F (36.9 C) (Oral)   Resp 19   SpO2 95%  Physical Exam Vitals and nursing note reviewed.  Constitutional:      Appearance: He is well-developed. He is diaphoretic.     Comments: Ill-appearing but nontoxic  HENT:     Head: Normocephalic and atraumatic.  Eyes:     Pupils: Pupils are equal, round, and reactive to light.  Cardiovascular:     Rate and Rhythm: Regular rhythm. Tachycardia present.     Heart sounds: Normal heart sounds. No murmur heard. Pulmonary:     Effort: Pulmonary effort is normal. No respiratory distress.     Breath sounds: Normal breath sounds. No wheezing.  Abdominal:     General: Bowel sounds are normal.     Palpations: Abdomen is soft.     Tenderness: There is no abdominal tenderness. There is no rebound.  Musculoskeletal:     Cervical back: Neck supple.  Lymphadenopathy:     Cervical: No cervical adenopathy.  Skin:    General: Skin is warm.  Neurological:     Mental Status: He is  alert and oriented to person, place, and time.     Comments: Tremulous  Psychiatric:        Mood and Affect: Mood normal.     ED Results / Procedures / Treatments   Labs (all labs ordered are listed, but only abnormal results are displayed) Labs Reviewed  CBC WITH DIFFERENTIAL/PLATELET  COMPREHENSIVE METABOLIC PANEL  ETHANOL  RAPID URINE DRUG SCREEN, HOSP PERFORMED  LIPASE, BLOOD    EKG None  Radiology No results found.  Procedures Procedures  {Document cardiac monitor, telemetry assessment procedure when appropriate:1}  Medications Ordered in ED Medications  LORazepam (ATIVAN) injection 0-4 mg (2 mg Intravenous Given 02/02/23 0505)    Or  LORazepam (ATIVAN) tablet 0-4 mg ( Oral See Alternative 02/02/23 0505)  LORazepam (ATIVAN) injection 0-4 mg (has no administration in time range)    Or  LORazepam (ATIVAN) tablet 0-4 mg (has no administration in time range)  thiamine (VITAMIN B1) tablet 100 mg (has no administration in time range)    Or  thiamine (VITAMIN B1) injection 100 mg (has no administration in time range)  ondansetron (ZOFRAN) injection 4 mg (4 mg Intravenous Given 02/02/23 0505)  sodium chloride 0.9 % bolus 1,000 mL (1,000 mLs Intravenous New Bag/Given 02/02/23 0505)    ED Course/ Medical Decision Making/ A&P Clinical Course as of 02/02/23 0507  Fri Feb 02, 2023  1610 Initial CIWA score 11 [CH]    Clinical Course User Index [CH] Viktorya Arguijo, Mayer Masker, MD   {   Click here for ABCD2, HEART and other calculatorsREFRESH Note before signing :1}                              Medical Decision Making Amount and/or Complexity of Data Reviewed Labs: ordered.  Risk OTC drugs. Prescription drug management.   ***  {Document critical care time when appropriate:1} {Document review of labs and clinical decision tools ie heart score, Chads2Vasc2 etc:1}  {Document your independent review of radiology images, and any outside records:1} {Document your discussion  with family members, caretakers, and with consultants:1} {Document social determinants of health affecting pt's care:1} {Document your decision making why or why not admission, treatments were needed:1} Final Clinical Impression(s) / ED Diagnoses Final diagnoses:  None    Rx / DC Orders ED Discharge Orders     None

## 2023-02-03 ENCOUNTER — Encounter (HOSPITAL_COMMUNITY): Payer: Self-pay | Admitting: Internal Medicine

## 2023-02-03 ENCOUNTER — Inpatient Hospital Stay (HOSPITAL_COMMUNITY): Payer: Medicare HMO

## 2023-02-03 DIAGNOSIS — E876 Hypokalemia: Secondary | ICD-10-CM

## 2023-02-03 DIAGNOSIS — F10930 Alcohol use, unspecified with withdrawal, uncomplicated: Secondary | ICD-10-CM | POA: Diagnosis not present

## 2023-02-03 DIAGNOSIS — H532 Diplopia: Secondary | ICD-10-CM

## 2023-02-03 LAB — CBC
HCT: 46.9 % (ref 39.0–52.0)
Hemoglobin: 15.7 g/dL (ref 13.0–17.0)
MCH: 29 pg (ref 26.0–34.0)
MCHC: 33.5 g/dL (ref 30.0–36.0)
MCV: 86.5 fL (ref 80.0–100.0)
Platelets: 156 10*3/uL (ref 150–400)
RBC: 5.42 MIL/uL (ref 4.22–5.81)
RDW: 16.8 % — ABNORMAL HIGH (ref 11.5–15.5)
WBC: 5.3 10*3/uL (ref 4.0–10.5)
nRBC: 0 % (ref 0.0–0.2)

## 2023-02-03 LAB — BASIC METABOLIC PANEL
Anion gap: 11 (ref 5–15)
BUN: 15 mg/dL (ref 8–23)
CO2: 24 mmol/L (ref 22–32)
Calcium: 8.5 mg/dL — ABNORMAL LOW (ref 8.9–10.3)
Chloride: 99 mmol/L (ref 98–111)
Creatinine, Ser: 1.14 mg/dL (ref 0.61–1.24)
GFR, Estimated: >60 mL/min (ref >60)
Glucose, Bld: 94 mg/dL (ref 70–99)
Potassium: 4 mmol/L (ref 3.5–5.1)
Sodium: 134 mmol/L — ABNORMAL LOW (ref 135–145)

## 2023-02-03 MED ORDER — METHOCARBAMOL 500 MG PO TABS: 500.0000 mg | ORAL_TABLET | Freq: Three times a day (TID) | ORAL | Status: DC | PRN

## 2023-02-03 MED ORDER — CLONIDINE HCL 0.1 MG PO TABS
0.1000 mg | ORAL_TABLET | Freq: Two times a day (BID) | ORAL | Status: DC
  Administered 2023-02-03 – 2023-02-04 (×3): 0.1 mg via ORAL
  Filled 2023-02-03 (×3): qty 1

## 2023-02-03 MED ORDER — FINASTERIDE 5 MG PO TABS
5.0000 mg | ORAL_TABLET | Freq: Every day | ORAL | Status: DC
  Administered 2023-02-03 – 2023-02-04 (×2): 5 mg via ORAL
  Filled 2023-02-03 (×2): qty 1

## 2023-02-03 MED ORDER — QUETIAPINE FUMARATE 100 MG PO TABS
300.0000 mg | ORAL_TABLET | Freq: Every day | ORAL | Status: DC
  Administered 2023-02-03: 300 mg via ORAL
  Filled 2023-02-03: qty 3

## 2023-02-03 MED ORDER — SODIUM CHLORIDE 0.9 % IV SOLN: INTRAVENOUS | Status: DC | PRN

## 2023-02-03 MED ORDER — HYDROXYZINE HCL 25 MG PO TABS
50.0000 mg | ORAL_TABLET | Freq: Every day | ORAL | Status: DC
  Administered 2023-02-03: 50 mg via ORAL
  Filled 2023-02-03: qty 2

## 2023-02-03 NOTE — Plan of Care (Signed)

## 2023-02-03 NOTE — Progress Notes (Signed)
PROGRESS NOTE  Ruben [name] OZD:[id number] DOB: 09-19-1950   PCP: Soundra Pilon, FNP  Patient is from: Home  DOA: 02/02/2023 LOS: 1  Chief complaints Chief Complaint  Patient presents with   Withdrawal   Alcohol Intoxication     Brief Narrative / Interim history: 72 year old M with PMH of alcohol abuse, prediabetes, HTN, HLD, GERD, BPH and obesity presenting with alcohol withdrawal symptoms including shaking and dry heaving and admitted for alcohol withdrawal management.  Reportedly drinks about 1 bottle of vodka every day.  Last drink about 24 to 48 hours prior to arrival.  Prior history of alcohol withdrawal seizure.  In ED, tachycardic to 140s and slightly elevated BP.  EtOH level 100.  Subjective: Seen and examined earlier this morning.  No major events overnight of this morning.  Patient reports double vision since last night.  Denies other focal neurodeficit.  Double vision resolves with covering 1 eye.  Denies nausea, vomiting or abdominal pain.  Last 3 CIWA scores 11, 11 and 2.   Objective: Vitals:   02/03/23 0918 02/03/23 1001 02/03/23 1100 02/03/23 1135  BP: (!) 154/81 (!) 150/81 (!) 145/85   Pulse: 98 77 76 72  Resp:      Temp:      TempSrc:      SpO2:  97% 99% 100%  Weight:      Height:        Examination:  GENERAL: No apparent distress.  Nontoxic. HEENT: MMM.  PERRL.  EOMI.  Binocular diplopia. NECK: Supple.  No apparent JVD.  RESP:  No IWOB.  Fair aeration bilaterally. CVS:  RRR. Heart sounds normal.  ABD/GI/GU: BS+. Abd soft, NTND.  MSK/EXT:  Moves extremities. No apparent deformity. No edema.  SKIN: no apparent skin lesion or wound NEURO: Awake, alert and oriented appropriately.  No apparent focal neuro deficit other than binocular diplopia. PSYCH: Calm. Normal affect.   Procedures:  None  Microbiology summarized: None  Assessment and plan: Alcohol withdrawal: Reports drinking about 1 bottle of vodka a day.  Last drink 24 to 48 hours prior  to arrival.  EtOH level 100.  Last 3 CIWA scores 11, 11, 2.  Interested in alcohol cessation. -Continue CIWA with as needed Ativan -Thiamine, folic acid, multivitamin -Counseled on the importance of alcohol cessation. -TOC consulted for resources.  Binocular diplopia: Reports symptom onset last night.  Diplopia resolves with covering 1 eye.  He has no other focal neurodeficit.  PERRL.  EOMI.  Improving per patient -MRI brain to rule out a stroke -Not a candidate for tPA given mild symptoms and duration greater than 8 hours.   Prediabetes: Not on meds.  BMP glucose within normal. -Monitor with daily labs   Essential hypertension/sinus tachycardia: BP slightly elevated.  Tachycardia resolved. -Resume home clonidine.  Takes at bedtime.  Increase to twice daily -Continue home metoprolol    Peripheral neuropathy -Continue home gabapentin at bedtime   Mood disorder/depression: Stable -Resume home meds   BPH without LUTS -Continue home Flomax and Proscar.  Hypokalemia -Monitor replenish as appropriate  Hyponatremia: Mild.  Likely from alcohol. -Monitor  Obesity: Body mass index is 30.69 kg/m. -Encourage lifestyle change to lose weight          DVT prophylaxis:  enoxaparin (LOVENOX) injection 40 mg Start: 02/02/23 0900 SCDs Start: 02/02/23 2595  Code Status: Full code Family Communication: None at bedside Level of care: Telemetry Status is: Inpatient Remains inpatient appropriate because: Alcohol withdrawal and binocular diplopia   Final  disposition: Home Consultants:  None  55 minutes with more than 50% spent in reviewing records, counseling patient/family and coordinating care.   Sch Meds:  Scheduled Meds:  Chlorhexidine Gluconate Cloth  6 each Topical Daily   enoxaparin (LOVENOX) injection  40 mg Subcutaneous Q24H   folic acid  1 mg Oral Daily   gabapentin  300 mg Oral QHS   LORazepam  0-4 mg Intravenous Q6H   Or   LORazepam  0-4 mg Oral Q6H   [START ON  02/04/2023] LORazepam  0-4 mg Intravenous Q12H   Or   [START ON 02/04/2023] LORazepam  0-4 mg Oral Q12H   metoprolol tartrate  50 mg Oral BID   multivitamin with minerals  1 tablet Oral Daily   rosuvastatin  20 mg Oral Q M,W,F   tamsulosin  0.4 mg Oral QHS   thiamine  100 mg Oral Daily   Or   thiamine  100 mg Intravenous Daily   Continuous Infusions: PRN Meds:.acetaminophen **OR** acetaminophen, albuterol, hydrALAZINE, LORazepam **OR** LORazepam, ondansetron **OR** ondansetron (ZOFRAN) IV, senna-docusate  Antimicrobials: Anti-infectives (From admission, onward)    None        I have personally reviewed the following labs and images: CBC: Recent Labs  Lab 02/02/23 0445 02/03/23 0739  WBC 11.1* 5.3  NEUTROABS 9.0*  --   HGB 16.5 15.7  HCT 51.2 46.9  MCV 89.4 86.5  PLT 222 156   BMP &GFR Recent Labs  Lab 02/02/23 0445 02/03/23 0739  NA 138 134*  K 3.4* 4.0  CL 104 99  CO2 23 24  GLUCOSE 112* 94  BUN 10 15  CREATININE 0.70 1.14  CALCIUM 9.1 8.5*   Estimated Creatinine Clearance: 71.5 mL/min (by C-G formula based on SCr of 1.14 mg/dL). Liver & Pancreas: Recent Labs  Lab 02/02/23 0445  AST 33  ALT 36  ALKPHOS 66  BILITOT 1.0  PROT 7.5  ALBUMIN 4.1   Recent Labs  Lab 02/02/23 0445  LIPASE 22   No results for input(s): "AMMONIA" in the last 168 hours. Diabetic: No results for input(s): "HGBA1C" in the last 72 hours. No results for input(s): "GLUCAP" in the last 168 hours. Cardiac Enzymes: No results for input(s): "CKTOTAL", "CKMB", "CKMBINDEX", "TROPONINI" in the last 168 hours. No results for input(s): "PROBNP" in the last 8760 hours. Coagulation Profile: No results for input(s): "INR", "PROTIME" in the last 168 hours. Thyroid Function Tests: No results for input(s): "TSH", "T4TOTAL", "FREET4", "T3FREE", "THYROIDAB" in the last 72 hours. Lipid Profile: No results for input(s): "CHOL", "HDL", "LDLCALC", "TRIG", "CHOLHDL", "LDLDIRECT" in the last  72 hours. Anemia Panel: No results for input(s): "VITAMINB12", "FOLATE", "FERRITIN", "TIBC", "IRON", "RETICCTPCT" in the last 72 hours. Urine analysis:    Component Value Date/Time   COLORURINE YELLOW 12/17/2022 1102   APPEARANCEUR HAZY (A) 12/17/2022 1102   LABSPEC 1.014 12/17/2022 1102   PHURINE 5.0 12/17/2022 1102   GLUCOSEU NEGATIVE 12/17/2022 1102   HGBUR MODERATE (A) 12/17/2022 1102   BILIRUBINUR NEGATIVE 12/17/2022 1102   KETONESUR 20 (A) 12/17/2022 1102   PROTEINUR 30 (A) 12/17/2022 1102   NITRITE NEGATIVE 12/17/2022 1102   LEUKOCYTESUR NEGATIVE 12/17/2022 1102   Sepsis Labs: Invalid input(s): "PROCALCITONIN", "LACTICIDVEN"  Microbiology: Recent Results (from the past 240 hour(s))  MRSA Next Gen by PCR, Nasal     Status: None   Collection Time: 02/02/23  9:09 AM   Specimen: Nasal Mucosa; Nasal Swab  Result Value Ref Range Status   MRSA by PCR Next  Gen NOT DETECTED NOT DETECTED Final    Comment: (NOTE) The GeneXpert MRSA Assay (FDA approved for NASAL specimens only), is one component of a comprehensive MRSA colonization surveillance program. It is not intended to diagnose MRSA infection nor to guide or monitor treatment for MRSA infections. Test performance is not FDA approved in patients less than 29 years old. Performed at [facility], [address] [place], [place] [address]     Radiology Studies: No results found.    Briona Korpela T. Ayame Rena [facility]  If 7PM-7AM, please contact night-coverage [email or web address] 02/03/2023, 11:38 AM

## 2023-02-04 DIAGNOSIS — F10930 Alcohol use, unspecified with withdrawal, uncomplicated: Secondary | ICD-10-CM | POA: Diagnosis not present

## 2023-02-04 LAB — COMPREHENSIVE METABOLIC PANEL
ALT: 29 U/L (ref 0–44)
AST: 25 U/L (ref 15–41)
Albumin: 3.3 g/dL — ABNORMAL LOW (ref 3.5–5.0)
Alkaline Phosphatase: 54 U/L (ref 38–126)
Anion gap: 10 (ref 5–15)
BUN: 17 mg/dL (ref 8–23)
CO2: 23 mmol/L (ref 22–32)
Calcium: 8.7 mg/dL — ABNORMAL LOW (ref 8.9–10.3)
Chloride: 101 mmol/L (ref 98–111)
Creatinine, Ser: 0.99 mg/dL (ref 0.61–1.24)
GFR, Estimated: >60 mL/min (ref >60)
Glucose, Bld: 108 mg/dL — ABNORMAL HIGH (ref 70–99)
Potassium: 3.6 mmol/L (ref 3.5–5.1)
Sodium: 134 mmol/L — ABNORMAL LOW (ref 135–145)
Total Bilirubin: 0.9 mg/dL (ref 0.3–1.2)
Total Protein: 6 g/dL — ABNORMAL LOW (ref 6.5–8.1)

## 2023-02-04 LAB — CBC
HCT: 44.8 % (ref 39.0–52.0)
Hemoglobin: 15.1 g/dL (ref 13.0–17.0)
MCH: 29.4 pg (ref 26.0–34.0)
MCHC: 33.7 g/dL (ref 30.0–36.0)
MCV: 87.3 fL (ref 80.0–100.0)
Platelets: 116 10*3/uL — ABNORMAL LOW (ref 150–400)
RBC: 5.13 MIL/uL (ref 4.22–5.81)
RDW: 16.2 % — ABNORMAL HIGH (ref 11.5–15.5)
WBC: 4.3 10*3/uL (ref 4.0–10.5)
nRBC: 0 % (ref 0.0–0.2)

## 2023-02-04 LAB — MAGNESIUM: Magnesium: 1.9 mg/dL (ref 1.7–2.4)

## 2023-02-04 LAB — LIPID PANEL
Cholesterol: 190 mg/dL (ref 0–200)
HDL: 68 mg/dL (ref >40)
LDL Cholesterol: 103 mg/dL — ABNORMAL HIGH (ref 0–99)
Total CHOL/HDL Ratio: 2.8 {ratio}
Triglycerides: 97 mg/dL (ref <150)
VLDL: 19 mg/dL (ref 0–40)

## 2023-02-04 LAB — PHOSPHORUS: Phosphorus: 2.9 mg/dL (ref 2.5–4.6)

## 2023-02-04 MED ORDER — CLONIDINE HCL 0.1 MG PO TABS: 0.1000 mg | ORAL_TABLET | Freq: Two times a day (BID) | ORAL | 11 refills | Status: DC

## 2023-02-04 MED ORDER — FOLIC ACID 1 MG PO TABS: 1.0000 mg | ORAL_TABLET | Freq: Every day | ORAL | 1 refills | Status: DC

## 2023-02-04 MED ORDER — VITAMIN B-1 100 MG PO TABS: 100.0000 mg | ORAL_TABLET | Freq: Every day | ORAL | 1 refills | Status: DC

## 2023-02-04 MED ORDER — ADULT MULTIVITAMIN W/MINERALS CH: 1.0000 | ORAL_TABLET | Freq: Every day | ORAL | Status: DC

## 2023-02-04 NOTE — TOC Transition Note (Signed)
Transition of Care [facility]) - CM/SW Discharge Note   Patient Details  Name: Ruben [name] MRN: [id number] Date of Birth: May 20, 1950  Transition of Care [facility]) CM/SW Contact:  Howell Rucks, RN Phone Number: 02/04/2023, 11:26 AM   Clinical Narrative:  Met with pt at bedside to review for dc planning, pt reports he has an established PCP and pharmacy, no current home care services, reports he has all the DME he needs in place, reports he feels safe returning home with support from his neighbor. Reports he will take [place] home. No further TOC needs identified.            Patient Goals and CMS Choice      Discharge Placement                         Discharge Plan and Services Additional resources added to the After Visit Summary for                                       Social Determinants of Health (SDOH) Interventions SDOH Screenings   Food Insecurity: No Food Insecurity (02/03/2023)  Housing: Low Risk  (02/03/2023)  Transportation Needs: No Transportation Needs (02/03/2023)  Utilities: Not At Risk (02/03/2023)  Tobacco Use: Low Risk  (01/25/2023)     Readmission Risk Interventions    02/02/2023    6:23 PM 12/19/2022   11:18 AM  Readmission Risk Prevention Plan  Transportation Screening Complete Complete  PCP or Specialist Appt within 5-7 Days Complete Complete  Home Care Screening Complete Complete  Medication Review (RN CM) Complete Referral to Pharmacy

## 2023-02-04 NOTE — Evaluation (Signed)
Physical Therapy Evaluation Patient Details Name: Ruben [name] MRN: [id number] DOB: 04-09-1951 Today's Date: 02/04/2023  History of Present Illness  72 year old M with presenting with alcohol withdrawal symptoms including shaking and dry heaving and admitted for alcohol withdrawal management.   PMH of alcohol abuse, prediabetes, HTN, HLD, GERD, BPH and obesity  Clinical Impression  Patient evaluated by Physical Therapy with no further acute PT needs identified. All education has been completed and the patient has no further questions.  Pt is pleasant and cooperative; amb hallway distance with no overt LOB and stability improving with incr distance. Pt was disoriented to time/month at start of session, able to recall date/time end of session.  Pt does not feel he needs f/u therapy and PT in agreement. Discussed incr activity gradually once home, he does have a walker if needed.   See below for any follow-up Physical Therapy or equipment needs. PT is signing off. Thank you for this referral.         If plan is discharge home, recommend the following: Assist for transportation   Can travel by private vehicle        Equipment Recommendations None recommended by PT  Recommendations for Other Services       Functional Status Assessment Patient has not had a recent decline in their functional status     Precautions / Restrictions Precautions Precautions: Fall Restrictions Weight Bearing Restrictions: No      Mobility  Bed Mobility Overal bed mobility: Modified Independent             General bed mobility comments: bed flat, incr time, no use of rails    Transfers Overall transfer level: Needs assistance Equipment used: None Transfers: Sit to/from Stand Sit to Stand: Contact guard assist           General transfer comment: for safety; reported initial dizziness which subsided ~ 30 seconds    Ambulation/Gait Ambulation/Gait assistance: Contact guard assist,  Supervision Gait Distance (Feet): 240 Feet Assistive device: None Gait Pattern/deviations: Step-through pattern, Drifts right/left       General Gait Details: mild drifting initially, no overt LOB, stability improved with distance  Stairs            Wheelchair Mobility     Tilt Bed    Modified Rankin (Stroke Patients Only)       Balance   Sitting-balance support: No upper extremity supported, Feet supported Sitting balance-Leahy Scale: Good       Standing balance-Leahy Scale: Fair Standing balance comment: Fair+; unable to tol mod challenges             High level balance activites: Direction changes, Turns, Head turns High Level Balance Comments: no LOB with above             Pertinent Vitals/Pain Pain Assessment Pain Assessment: No/denies pain    Home Living Family/patient expects to be discharged to:: Private residence Living Arrangements: Alone Available Help at Discharge: Family;Available PRN/intermittently Type of Home: House Home Access: Stairs to enter Entrance Stairs-Rails: None Entrance Stairs-Number of Steps: 1   Home Layout: One level Home Equipment: Agricultural consultant (2 wheels)      Prior Function Prior Level of Function : Independent/Modified Independent                     Extremity/Trunk Assessment   Upper Extremity Assessment Upper Extremity Assessment: Overall WFL for tasks assessed    Lower Extremity Assessment Lower Extremity Assessment: Overall WFL for  tasks assessed       Communication   Communication Communication: No apparent difficulties  Cognition Arousal: Alert Behavior During Therapy: WFL for tasks assessed/performed Overall Cognitive Status: Impaired/Different from baseline Area of Impairment: Orientation                 Orientation Level: Disoriented to, Time             General Comments: pt unsure of month, knew is was Sunday; able to recall end of session after oriented by PT         General Comments      Exercises     Assessment/Plan    PT Assessment Patient does not need any further PT services  PT Problem List         PT Treatment Interventions      PT Goals (Current goals can be found in the Care Plan section)  Acute Rehab PT Goals PT Goal Formulation: All assessment and education complete, DC therapy    Frequency       Co-evaluation               AM-PAC PT "6 Clicks" Mobility  Outcome Measure Help needed turning from your back to your side while in a flat bed without using bedrails?: None Help needed moving from lying on your back to sitting on the side of a flat bed without using bedrails?: None Help needed moving to and from a bed to a chair (including a wheelchair)?: None Help needed standing up from a chair using your arms (e.g., wheelchair or bedside chair)?: None Help needed to walk in hospital room?: None Help needed climbing 3-5 steps with a railing? : A Little 6 Click Score: 23    End of Session Equipment Utilized During Treatment: Gait belt Activity Tolerance: Patient tolerated treatment well Patient left: with call bell/phone within reach;in bed;with bed alarm set   PT Visit Diagnosis: Other abnormalities of gait and mobility (R26.89)    Time: 1324-4010 PT Time Calculation (min) (ACUTE ONLY): 16 min   Charges:   PT Evaluation $PT Eval Low Complexity: 1 Low   PT General Charges $$ ACUTE PT VISIT: 1 Visit         Audrionna Lampton, PT  Acute Rehab Dept ([facility]) [phone]  02/04/2023   [facility] 02/04/2023, 11:08 AM

## 2023-02-04 NOTE — Plan of Care (Signed)
No acute events overnight. Problem: Education: Goal: Knowledge of General Education information will improve Description: Including pain rating scale, medication(s)/side effects and non-pharmacologic comfort measures Outcome: Progressing   Problem: Health Behavior/Discharge Planning: Goal: Ability to manage health-related needs will improve Outcome: Progressing   Problem: Clinical Measurements: Goal: Ability to maintain clinical measurements within normal limits will improve Outcome: Progressing Goal: Will remain free from infection Outcome: Progressing Goal: Diagnostic test results will improve Outcome: Progressing Goal: Respiratory complications will improve Outcome: Progressing Goal: Cardiovascular complication will be avoided Outcome: Progressing   Problem: Activity: Goal: Risk for activity intolerance will decrease Outcome: Progressing   Problem: Nutrition: Goal: Adequate nutrition will be maintained Outcome: Progressing   Problem: Coping: Goal: Level of anxiety will decrease Outcome: Progressing   Problem: Elimination: Goal: Will not experience complications related to bowel motility Outcome: Progressing Goal: Will not experience complications related to urinary retention Outcome: Progressing   Problem: Pain Management: Goal: General experience of comfort will improve Outcome: Progressing   Problem: Safety: Goal: Ability to remain free from injury will improve Outcome: Progressing   Problem: Skin Integrity: Goal: Risk for impaired skin integrity will decrease Outcome: Progressing

## 2023-02-04 NOTE — Discharge Summary (Signed)
Physician Discharge Summary  Ruben [name]:[id number] DOB: 06/03/1950 DOA: 02/02/2023  PCP: Soundra Pilon, FNP  Admit date: 02/02/2023 Discharge date: 02/04/2023 Admitted From: Home Disposition: Home Recommendations for Outpatient Follow-up:  Follow up with PCP in 1 week Check CMP and CBC at follow-up Please follow up on the following pending results: None  Home Health: Not indicated Equipment/Devices: Not indicated  Discharge Condition: Stable CODE STATUS: Full code  Follow-up Information     Soundra Pilon, FNP. Schedule an appointment as soon as possible for a visit in 1 week(s).   Specialty: Family Medicine Contact information: [address] [place] [address] [phone]                 Hospital course 72 year old M with PMH of alcohol abuse, prediabetes, HTN, HLD, GERD, BPH and obesity presenting with alcohol withdrawal symptoms including shaking and dry heaving and admitted for alcohol withdrawal management.  Reportedly drinks about 1 bottle of vodka every day.  Last drink about 24 to 48 hours prior to arrival.  Prior history of alcohol withdrawal seizure.   In ED, tachycardic to 140s and slightly elevated BP.  EtOH level 100.  He was started on IV fluid, CIWA with as needed Ativan and vitamin supplementations and admitted.  The next day, patient had no significant alcohol withdrawal symptoms.  However, he reported new onset binocular diplopia.  MRI brain without acute finding.    On the day of discharge, patient remained stable without withdrawal symptoms.  He did not require as needed Ativan.  Binocular diplopia resolved.  He is discharged to follow-up with PCP in 1 week.  Extensively counseled on the importance of alcohol cessation.  Provided with resources.  He is planning to go back to [employer].  See individual problem list below for more.   Problems addressed during this hospitalization Alcohol withdrawal: Reports drinking about 1  bottle of vodka a day.  Last drink 24 to 48 hours prior to arrival.  EtOH level 100.  Last 3 CIWA scores 6, 1 and 0.  Patient did not require Ativan in the last 24 hours.  Extensively counseled on the importance of alcohol cessation.  Provided with resources.  He is planning to go back to AA meeting. -Continue multivitamin, folic acid and thiamine   Binocular diplopia: Diplopia resolves with covering 1 eye.  MRI brain without acute finding. He has no other focal neurodeficit.  PERRL.  EOMI. diplopia resolved on the day of discharge -Encouraged to follow-up with ophthalmology outpatient   Prediabetes: Not on meds.  BMP glucose within normal.   Essential hypertension/sinus tachycardia: BP and tachycardia improved. -Increased clonidine to 0.1 mg twice daily -Continue home Toprol-XL -Reassess outpatient   Peripheral neuropathy -Continue home gabapentin at bedtime -Encourage alcohol cessation   Mood disorder/depression: Stable -Continue home meds   BPH without LUTS -Continue home Flomax and Proscar.   Hypokalemia: Resolved   Hyponatremia: Mild.  Likely from alcohol. -Recheck outpatient   Obesity: Body mass index is 30.69 kg/m.            Time spent 35 minutes  Vital signs Vitals:   02/03/23 1629 02/03/23 2037 02/04/23 0534 02/04/23 0952  BP:  (!) 147/91 110/84 (!) 141/84  Pulse:  (!) 108 76 82  Temp: 98.1 F (36.7 C) (!) 97.4 F (36.3 C) 97.6 F (36.4 C)   Resp:  18 18   Height:      Weight:      SpO2:  97% 96%   TempSrc: Oral Oral Oral   BMI (Calculated):         Discharge exam  GENERAL: No apparent distress.  Nontoxic. HEENT: MMM.  Vision and hearing grossly intact.  NECK: Supple.  No apparent JVD.  RESP:  No IWOB.  Fair aeration bilaterally. CVS:  RRR. Heart sounds normal.  ABD/GI/GU: BS+. Abd soft, NTND.  MSK/EXT:  Moves extremities. No apparent deformity. No edema.  SKIN: no apparent skin lesion or wound NEURO: Awake and alert. Oriented  appropriately.  No apparent focal neuro deficit. PSYCH: Calm. Normal affect.   Discharge Instructions Discharge Instructions     Diet - low sodium heart healthy   Complete by: As directed    Discharge instructions   Complete by: As directed    It has been a pleasure taking care of you!  You were hospitalized due to alcohol withdrawal.  Your symptoms improved.  We strongly encourage you to stop drinking alcohol.  Please use the resources you have been provided for help with alcohol cessation.  Please review your new medication list and the directions on your medications before you take them.  Follow-up with your primary care doctor 1 to 2 weeks or sooner if needed.  We also recommend follow-up with ophthalmology as soon as possible.   Take care,   Increase activity slowly   Complete by: As directed       Allergies as of 02/04/2023   No Known Allergies      Medication List     STOP taking these medications    hydrOXYzine 50 MG tablet Commonly known as: ATARAX   ibuprofen 200 MG tablet Commonly known as: ADVIL       TAKE these medications    cloNIDine 0.1 MG tablet Commonly known as: CATAPRES Take 1 tablet (0.1 mg total) by mouth 2 (two) times daily. What changed: when to take this   finasteride 5 MG tablet Commonly known as: PROSCAR Take 5 mg by mouth daily.   folic acid 1 MG tablet Commonly known as: FOLVITE Take 1 tablet (1 mg total) by mouth daily. Start taking on: February 05, 2023   gabapentin 300 MG capsule Commonly known as: NEURONTIN Take 300 mg by mouth at bedtime.   methocarbamol 500 MG tablet Commonly known as: ROBAXIN Take 500 mg by mouth every 4 (four) hours as needed for muscle spasms.   metoprolol tartrate 50 MG tablet Commonly known as: LOPRESSOR Take 50 mg by mouth 2 (two) times daily.   Milk of Magnesia 2400 MG/30ML suspension Generic drug: magnesium hydroxide Take 30 mLs by mouth every other day.   multivitamin with minerals Tabs  tablet Take 1 tablet by mouth daily. Start taking on: February 05, 2023   QUEtiapine 300 MG tablet Commonly known as: SEROQUEL Take 300 mg by mouth at bedtime.   rosuvastatin 20 MG tablet Commonly known as: CRESTOR Take 20 mg by mouth every Monday, Wednesday, and Friday.   senna-docusate 8.6-50 MG tablet Commonly known as: Senokot-S Take 1 tablet by mouth daily as needed for mild constipation.   tamsulosin 0.4 MG Caps capsule Commonly known as: FLOMAX Take 0.4 mg by mouth at bedtime.   thiamine 100 MG tablet Commonly known as: Vitamin B-1 Take 1 tablet (100 mg total) by mouth daily. Start taking on: February 05, 2023        Consultations: None  Procedures/Studies:   MR BRAIN WO CONTRAST  Result Date: 02/03/2023 CLINICAL DATA:  72 year old male with neurologic  deficit. Blurred vision. EXAM: MRI HEAD WITHOUT CONTRAST TECHNIQUE: Multiplanar, multiecho pulse sequences of the brain and surrounding structures were obtained without intravenous contrast. COMPARISON:  Head CT 12/17/2022. FINDINGS: Brain: No restricted diffusion to suggest acute infarction. No midline shift, mass effect, evidence of mass lesion, ventriculomegaly, extra-axial collection or acute intracranial hemorrhage. Cervicomedullary junction and pituitary are within normal limits. Largely normal for age gray and white matter signal. Scattered minimal nonspecific white matter T2 and FLAIR hyperintensity. No cortical encephalomalacia or chronic cerebral blood products identified. Deep gray nuclei, brainstem, cerebellum appear negative. Vascular: Major intracranial vascular flow voids are preserved. Skull and upper cervical spine: Negative visible cervical spine. Visualized bone marrow signal is within normal limits. Sinuses/Orbits: Chronic right frontal sinus retention cyst (series 9, image 29). Other paranasal sinuses are stable and well aerated. Normal suprasellar cistern. Normal optic chiasm. Negative noncontrast  appearance of the cavernous sinus. Orbits soft tissues appears symmetric and normal. Other: Mastoids are clear. Visible internal auditory structures appear normal. Negative visible scalp and face. IMPRESSION: No acute intracranial abnormality. Essentially normal for age noncontrast MRI appearance of the Brain. Electronically Signed   By: Odessa Fleming M.D.   On: 02/03/2023 13:31       The results of significant diagnostics from this hospitalization (including imaging, microbiology, ancillary and laboratory) are listed below for reference.     Microbiology: Recent Results (from the past 240 hour(s))  MRSA Next Gen by PCR, Nasal     Status: None   Collection Time: 02/02/23  9:09 AM   Specimen: Nasal Mucosa; Nasal Swab  Result Value Ref Range Status   MRSA by PCR Next Gen NOT DETECTED NOT DETECTED Final    Comment: (NOTE) The GeneXpert MRSA Assay (FDA approved for NASAL specimens only), is one component of a comprehensive MRSA colonization surveillance program. It is not intended to diagnose MRSA infection nor to guide or monitor treatment for MRSA infections. Test performance is not FDA approved in patients less than 60 years old. Performed at [facility], [address], [place], [place] [address]      Labs:  CBC: Recent Labs  Lab 02/02/23 0445 02/03/23 0739 02/04/23 0607  WBC 11.1* 5.3 4.3  NEUTROABS 9.0*  --   --   HGB 16.5 15.7 15.1  HCT 51.2 46.9 44.8  MCV 89.4 86.5 87.3  PLT 222 156 116*   BMP &GFR Recent Labs  Lab 02/02/23 0445 02/03/23 0739 02/04/23 0607  NA 138 134* 134*  K 3.4* 4.0 3.6  CL 104 99 101  CO2 23 24 23   GLUCOSE 112* 94 108*  BUN 10 15 17   CREATININE 0.70 1.14 0.99  CALCIUM 9.1 8.5* 8.7*  MG  --   --  1.9  PHOS  --   --  2.9   Estimated Creatinine Clearance: 82.4 mL/min (by C-G formula based on SCr of 0.99 mg/dL). Liver & Pancreas: Recent Labs  Lab 02/02/23 0445 02/04/23 0607  AST 33 25  ALT 36 29  ALKPHOS 66 54   BILITOT 1.0 0.9  PROT 7.5 6.0*  ALBUMIN 4.1 3.3*   Recent Labs  Lab 02/02/23 0445  LIPASE 22   No results for input(s): "AMMONIA" in the last 168 hours. Diabetic: No results for input(s): "HGBA1C" in the last 72 hours. No results for input(s): "GLUCAP" in the last 168 hours. Cardiac Enzymes: No results for input(s): "CKTOTAL", "CKMB", "CKMBINDEX", "TROPONINI" in the last 168 hours. No results for input(s): "PROBNP" in the last 8760  hours. Coagulation Profile: No results for input(s): "INR", "PROTIME" in the last 168 hours. Thyroid Function Tests: No results for input(s): "TSH", "T4TOTAL", "FREET4", "T3FREE", "THYROIDAB" in the last 72 hours. Lipid Profile: Recent Labs    02/04/23 0607  CHOL 190  HDL 68  LDLCALC 103*  TRIG 97  CHOLHDL 2.8   Anemia Panel: No results for input(s): "VITAMINB12", "FOLATE", "FERRITIN", "TIBC", "IRON", "RETICCTPCT" in the last 72 hours. Urine analysis:    Component Value Date/Time   COLORURINE YELLOW 12/17/2022 1102   APPEARANCEUR HAZY (A) 12/17/2022 1102   LABSPEC 1.014 12/17/2022 1102   PHURINE 5.0 12/17/2022 1102   GLUCOSEU NEGATIVE 12/17/2022 1102   HGBUR MODERATE (A) 12/17/2022 1102   BILIRUBINUR NEGATIVE 12/17/2022 1102   KETONESUR 20 (A) 12/17/2022 1102   PROTEINUR 30 (A) 12/17/2022 1102   NITRITE NEGATIVE 12/17/2022 1102   LEUKOCYTESUR NEGATIVE 12/17/2022 1102   Sepsis Labs: Invalid input(s): "PROCALCITONIN", "LACTICIDVEN"   SIGNED:  Almon Hercules, MD  [facility] 02/04/2023, 3:29 PM

## 2023-02-07 ENCOUNTER — Observation Stay (HOSPITAL_COMMUNITY)
Admission: EM | Admit: 2023-02-07 | Discharge: 2023-02-10 | Disposition: A | Discharge status: Home / Self Care | Payer: Medicare HMO | Attending: Internal Medicine | Admitting: Internal Medicine

## 2023-02-07 ENCOUNTER — Encounter (HOSPITAL_COMMUNITY): Payer: Self-pay

## 2023-02-07 ENCOUNTER — Other Ambulatory Visit: Payer: Self-pay

## 2023-02-07 ENCOUNTER — Emergency Department (HOSPITAL_COMMUNITY): Payer: Medicare HMO

## 2023-02-07 DIAGNOSIS — F10929 Alcohol use, unspecified with intoxication, unspecified: Secondary | ICD-10-CM | POA: Diagnosis not present

## 2023-02-07 DIAGNOSIS — F10139 Alcohol abuse with withdrawal, unspecified: Secondary | ICD-10-CM | POA: Diagnosis not present

## 2023-02-07 DIAGNOSIS — S199XXA Unspecified injury of neck, initial encounter: Secondary | ICD-10-CM | POA: Diagnosis not present

## 2023-02-07 DIAGNOSIS — Z79899 Other long term (current) drug therapy: Secondary | ICD-10-CM | POA: Insufficient documentation

## 2023-02-07 DIAGNOSIS — Y908 Blood alcohol level of 240 mg/100 ml or more: Secondary | ICD-10-CM | POA: Insufficient documentation

## 2023-02-07 DIAGNOSIS — F1010 Alcohol abuse, uncomplicated: Secondary | ICD-10-CM | POA: Diagnosis not present

## 2023-02-07 DIAGNOSIS — R000 Tachycardia, unspecified: Secondary | ICD-10-CM | POA: Diagnosis not present

## 2023-02-07 DIAGNOSIS — G629 Polyneuropathy, unspecified: Secondary | ICD-10-CM | POA: Diagnosis not present

## 2023-02-07 DIAGNOSIS — F10939 Alcohol use, unspecified with withdrawal, unspecified: Secondary | ICD-10-CM | POA: Diagnosis present

## 2023-02-07 DIAGNOSIS — I10 Essential (primary) hypertension: Secondary | ICD-10-CM | POA: Insufficient documentation

## 2023-02-07 DIAGNOSIS — S0990XA Unspecified injury of head, initial encounter: Secondary | ICD-10-CM | POA: Diagnosis not present

## 2023-02-07 LAB — I-STAT VENOUS BLOOD GAS, ED
Acid-base deficit: 5 mmol/L — ABNORMAL HIGH (ref 0.0–2.0)
Bicarbonate: 19.1 mmol/L — ABNORMAL LOW (ref 20.0–28.0)
Calcium, Ion: 0.97 mmol/L — ABNORMAL LOW (ref 1.15–1.40)
HCT: 50 % (ref 39.0–52.0)
Hemoglobin: 17 g/dL (ref 13.0–17.0)
O2 Saturation: 99 %
Potassium: 4.3 mmol/L (ref 3.5–5.1)
Sodium: 141 mmol/L (ref 135–145)
TCO2: 20 mmol/L — ABNORMAL LOW (ref 22–32)
pCO2, Ven: 33.4 mm[Hg] — ABNORMAL LOW (ref 44–60)
pH, Ven: 7.364 (ref 7.25–7.43)
pO2, Ven: 125 mm[Hg] — ABNORMAL HIGH (ref 32–45)

## 2023-02-07 LAB — COMPREHENSIVE METABOLIC PANEL
ALT: 70 U/L — ABNORMAL HIGH (ref 0–44)
AST: 59 U/L — ABNORMAL HIGH (ref 15–41)
Albumin: 3.7 g/dL (ref 3.5–5.0)
Alkaline Phosphatase: 68 U/L (ref 38–126)
Anion gap: 16 — ABNORMAL HIGH (ref 5–15)
BUN: 12 mg/dL (ref 8–23)
CO2: 17 mmol/L — ABNORMAL LOW (ref 22–32)
Calcium: 8.4 mg/dL — ABNORMAL LOW (ref 8.9–10.3)
Chloride: 109 mmol/L (ref 98–111)
Creatinine, Ser: 1.19 mg/dL (ref 0.61–1.24)
GFR, Estimated: >60 mL/min (ref >60)
Glucose, Bld: 78 mg/dL (ref 70–99)
Potassium: 4.4 mmol/L (ref 3.5–5.1)
Sodium: 142 mmol/L (ref 135–145)
Total Bilirubin: 0.7 mg/dL (ref 0.3–1.2)
Total Protein: 6.9 g/dL (ref 6.5–8.1)

## 2023-02-07 LAB — CBC WITH DIFFERENTIAL/PLATELET
Abs Immature Granulocytes: 0.01 10*3/uL (ref 0.00–0.07)
Basophils Absolute: 0.1 10*3/uL (ref 0.0–0.1)
Basophils Relative: 1 %
Eosinophils Absolute: 0.1 10*3/uL (ref 0.0–0.5)
Eosinophils Relative: 2 %
HCT: 48.9 % (ref 39.0–52.0)
Hemoglobin: 16.6 g/dL (ref 13.0–17.0)
Immature Granulocytes: 0 %
Lymphocytes Relative: 25 %
Lymphs Abs: 1.6 10*3/uL (ref 0.7–4.0)
MCH: 29.5 pg (ref 26.0–34.0)
MCHC: 33.9 g/dL (ref 30.0–36.0)
MCV: 86.9 fL (ref 80.0–100.0)
Monocytes Absolute: 0.5 10*3/uL (ref 0.1–1.0)
Monocytes Relative: 8 %
Neutro Abs: 4 10*3/uL (ref 1.7–7.7)
Neutrophils Relative %: 64 %
Platelets: 172 10*3/uL (ref 150–400)
RBC: 5.63 MIL/uL (ref 4.22–5.81)
RDW: 16.8 % — ABNORMAL HIGH (ref 11.5–15.5)
WBC: 6.3 10*3/uL (ref 4.0–10.5)
nRBC: 0 % (ref 0.0–0.2)

## 2023-02-07 LAB — CK: Total CK: 88 U/L (ref 49–397)

## 2023-02-07 LAB — CBG MONITORING, ED
Glucose-Capillary: 98 mg/dL (ref 70–99)
Glucose-Capillary: 83 mg/dL (ref 70–99)
Glucose-Capillary: 135 mg/dL — ABNORMAL HIGH (ref 70–99)

## 2023-02-07 LAB — ETHANOL: Alcohol, Ethyl (B): 317 mg/dL (ref <10)

## 2023-02-07 MED ORDER — LORAZEPAM 2 MG/ML IJ SOLN
0.5000 mg | Freq: Once | INTRAMUSCULAR | Status: AC
  Administered 2023-02-07: 0.5 mg via INTRAMUSCULAR
  Filled 2023-02-07: qty 1

## 2023-02-07 MED ORDER — LORAZEPAM 2 MG/ML IJ SOLN: 1.0000 mg | INTRAMUSCULAR | Status: DC | PRN

## 2023-02-07 MED ORDER — FOLIC ACID 1 MG PO TABS: 1.0000 mg | ORAL_TABLET | Freq: Every day | ORAL | Status: DC

## 2023-02-07 MED ORDER — ONDANSETRON HCL 4 MG/2ML IJ SOLN
4.0000 mg | Freq: Once | INTRAMUSCULAR | Status: AC
  Administered 2023-02-07: 4 mg via INTRAVENOUS
  Filled 2023-02-07: qty 2

## 2023-02-07 MED ORDER — METOPROLOL TARTRATE 25 MG PO TABS
50.0000 mg | ORAL_TABLET | Freq: Once | ORAL | Status: AC
  Administered 2023-02-07: 50 mg via ORAL
  Filled 2023-02-07: qty 2

## 2023-02-07 MED ORDER — FOLIC ACID 1 MG PO TABS
1.0000 mg | ORAL_TABLET | Freq: Every day | ORAL | Status: DC
  Administered 2023-02-07 – 2023-02-10 (×4): 1 mg via ORAL
  Filled 2023-02-07 (×4): qty 1

## 2023-02-07 MED ORDER — ADULT MULTIVITAMIN W/MINERALS CH
1.0000 | ORAL_TABLET | Freq: Every day | ORAL | Status: DC
  Administered 2023-02-07 – 2023-02-10 (×4): 1 via ORAL
  Filled 2023-02-07 (×4): qty 1

## 2023-02-07 MED ORDER — LACTATED RINGERS IV BOLUS
1000.0000 mL | Freq: Once | INTRAVENOUS | Status: AC
  Administered 2023-02-07: 1000 mL via INTRAVENOUS

## 2023-02-07 MED ORDER — LORAZEPAM 1 MG PO TABS
1.0000 mg | ORAL_TABLET | ORAL | Status: DC | PRN
Start: 2023-02-07 — End: 2023-02-07
  Administered 2023-02-07: 2 mg via ORAL
  Filled 2023-02-07: qty 2

## 2023-02-07 MED ORDER — THIAMINE MONONITRATE 100 MG PO TABS: 100.0000 mg | ORAL_TABLET | Freq: Every day | ORAL | Status: DC

## 2023-02-07 MED ORDER — LORAZEPAM 2 MG/ML IJ SOLN
1.0000 mg | INTRAMUSCULAR | Status: DC | PRN
  Administered 2023-02-07 (×2): 2 mg via INTRAVENOUS
  Filled 2023-02-07 (×2): qty 1

## 2023-02-07 MED ORDER — ONDANSETRON HCL 4 MG PO TABS: 4.0000 mg | ORAL_TABLET | Freq: Four times a day (QID) | ORAL | Status: DC | PRN

## 2023-02-07 MED ORDER — ACETAMINOPHEN 650 MG RE SUPP: 650.0000 mg | Freq: Four times a day (QID) | RECTAL | Status: DC | PRN

## 2023-02-07 MED ORDER — THIAMINE HCL 100 MG/ML IJ SOLN: 100.0000 mg | Freq: Every day | INTRAMUSCULAR | Status: DC

## 2023-02-07 MED ORDER — LORAZEPAM 1 MG PO TABS
1.0000 mg | ORAL_TABLET | ORAL | Status: DC | PRN
  Filled 2023-02-07: qty 1

## 2023-02-07 MED ORDER — THIAMINE MONONITRATE 100 MG PO TABS
100.0000 mg | ORAL_TABLET | Freq: Every day | ORAL | Status: DC
  Administered 2023-02-07 – 2023-02-10 (×4): 100 mg via ORAL
  Filled 2023-02-07 (×4): qty 1

## 2023-02-07 MED ORDER — ADULT MULTIVITAMIN W/MINERALS CH: 1.0000 | ORAL_TABLET | Freq: Every day | ORAL | Status: DC

## 2023-02-07 MED ORDER — ACETAMINOPHEN 500 MG PO TABS: 500.0000 mg | ORAL_TABLET | Freq: Two times a day (BID) | ORAL | Status: DC | PRN

## 2023-02-07 MED ORDER — HEPARIN SODIUM (PORCINE) 5000 UNIT/ML IJ SOLN
5000.0000 [IU] | Freq: Three times a day (TID) | INTRAMUSCULAR | Status: DC
  Administered 2023-02-07 – 2023-02-09 (×7): 5000 [IU] via SUBCUTANEOUS
  Filled 2023-02-07 (×7): qty 1

## 2023-02-07 MED ORDER — ONDANSETRON HCL 4 MG/2ML IJ SOLN: 4.0000 mg | Freq: Four times a day (QID) | INTRAMUSCULAR | Status: DC | PRN

## 2023-02-07 MED ORDER — BISACODYL 5 MG PO TBEC: 5.0000 mg | DELAYED_RELEASE_TABLET | Freq: Every day | ORAL | Status: DC | PRN

## 2023-02-07 NOTE — ED Provider Notes (Signed)
[place] EMERGENCY DEPARTMENT AT [facility] Provider Note   CSN: [id number] Arrival date & time: 02/07/23  1254     History No chief complaint on file.   Ruben [name] is a 72 y.o. male with history of alcohol intoxication, hypertension, AKI presents emerged from today for evaluation of intoxication.  Patient was found in between his neighbors houses on the front lawn.  Neighbor called EMS.  Patient reports that he has had half a gallon of vodka and additional to 2 pints of vodka this morning.  Patient is polite, however is occasionally agitated, but redirectable.  He currently has no complaints.  Reports that his last drink was around 1 hour prior to arrival.  He currently denies any head, chest, belly, or back pain.  Denies any extremity pain or any shortness of breath.  He is requesting Ativan.  He arrived via EMS with a reported that he was tachycardic.  He did not take any of his daily medications today.  HPI     Home Medications Prior to Admission medications   Medication Sig Start Date End Date Taking? Authorizing Provider  cloNIDine (CATAPRES) 0.1 MG tablet Take 1 tablet (0.1 mg total) by mouth 2 (two) times daily. 02/04/23   Almon Hercules, MD  finasteride (PROSCAR) 5 MG tablet Take 5 mg by mouth daily.    [provider]  folic acid (FOLVITE) 1 MG tablet Take 1 tablet (1 mg total) by mouth daily. 02/05/23   Almon Hercules, MD  gabapentin (NEURONTIN) 300 MG capsule Take 300 mg by mouth at bedtime.    [provider]  magnesium hydroxide (MILK OF MAGNESIA) 400 MG/5ML suspension Take 30 mLs by mouth every other day.    [provider]  methocarbamol (ROBAXIN) 500 MG tablet Take 500 mg by mouth every 4 (four) hours as needed for muscle spasms. 03/10/21   [provider]  metoprolol tartrate (LOPRESSOR) 50 MG tablet Take 50 mg by mouth 2 (two) times daily.    [provider]  Multiple Vitamin (MULTIVITAMIN WITH MINERALS) TABS  tablet Take 1 tablet by mouth daily. 02/05/23   Almon Hercules, MD  QUEtiapine (SEROQUEL) 300 MG tablet Take 300 mg by mouth at bedtime. 12/02/22   [provider]  rosuvastatin (CRESTOR) 20 MG tablet Take 20 mg by mouth every Monday, Wednesday, and Friday. 08/23/21   [provider]  senna-docusate (SENOKOT-S) 8.6-50 MG tablet Take 1 tablet by mouth daily as needed for mild constipation.    [provider]  tamsulosin (FLOMAX) 0.4 MG CAPS capsule Take 0.4 mg by mouth at bedtime.    [provider]  thiamine (VITAMIN B-1) 100 MG tablet Take 1 tablet (100 mg total) by mouth daily. 02/05/23   Almon Hercules, MD      Allergies    Patient has no known allergies.    Review of Systems   Review of Systems  Reason unable to perform ROS: Patient intoxicated.    Physical Exam Updated Vital Signs There were no vitals taken for this visit. Physical Exam Vitals and nursing note reviewed.  Constitutional:      General: He is not in acute distress.    Appearance: He is not toxic-appearing.  HENT:     Head: Normocephalic and atraumatic.     Comments: Nontender to palpation.  No step-offs or deformities.  No battle signs or raccoon eyes. Smells of EtOH.    Nose: Nose normal.  Mouth/Throat:     Mouth: Mucous membranes are dry.  Eyes:     General: No scleral icterus.    Extraocular Movements: Extraocular movements intact.     Pupils: Pupils are equal, round, and reactive to light.  Neck:     Comments: Nontender to palpation.  No step-offs or deformities. Cardiovascular:     Rate and Rhythm: Tachycardia present.  Pulmonary:     Effort: Pulmonary effort is normal. No respiratory distress.  Abdominal:     Palpations: Abdomen is soft.     Tenderness: There is no abdominal tenderness. There is no guarding or rebound.  Musculoskeletal:     Cervical back: Normal range of motion. No rigidity or tenderness.     Right lower leg: No edema.     Left lower leg: No  edema.     Comments: Bilateral upper and lower extremities nontender to palpation.  No signs of trauma to the upper extremities.  Palpable radial pulses.  Palpable DP and PT pulses.  Compartments are soft throughout.  The patient does not endorse any midline or paraspinal cervical or thoracic, or lumbar tension with patient.  No signs of trauma to the back either.  Skin:    General: Skin is warm and dry.  Neurological:     General: No focal deficit present.     Mental Status: He is alert.     Comments: Moving all extremity.  Good strength with and without resistance.  Has some difficulty following commands, likely due to EtOH.  He is alert and conversational however.  No facial droop noted.  Slight tremor with the hands.     ED Results / Procedures / Treatments   Labs (all labs ordered are listed, but only abnormal results are displayed) Labs Reviewed - No data to display  EKG None  Radiology No results found.  Procedures Procedures   Medications Ordered in ED Medications - No data to display  ED Course/ Medical Decision Making/ A&P    Medical Decision Making Amount and/or Complexity of Data Reviewed Labs: ordered. Radiology: ordered.  Risk OTC drugs. Prescription drug management.   72 y.o. male presents to the ER for evaluation of EtOH use. Differential diagnosis includes but is not limited to intoxication, alcohol ketoacidosis, withdrawal, trauma. Vital signs elevated blood pressure and tachycardic in the 120s otherwise unremarkable. Physical exam as noted above.   Patient does not have any signs of trauma to the head or neck or torso.  He is moving all limbs and extremities.  Will order head CT and neck given his age and unknown if he had any falls.  Labs ordered as well.  On previous chart evaluation, patient's been seen multiple times for alcohol and alcohol withdrawals. Usually has to be admitted. It appears that he starts to feel withdrawal symptoms when his  alcohol is in the 100s. Currently he is not experiencing any symptoms.  Denies any nausea, vomiting, chest pain, belly pain, shortness of breath, or headache.  He is still requesting Ativan.  Hand off to oncoming shift to follow up on imaging and labs.  Likely may need admission given his previous chart evaluations, but will leave this to be determined by oncoming team.  Portions of this report may have been transcribed using voice recognition software. Every effort was made to ensure accuracy; however, inadvertent computerized transcription errors may be present.   Final Clinical Impression(s) / ED Diagnoses Final diagnoses:  None    Rx / DC Orders ED Discharge  Orders     None         Achille Rich, PA-C 02/07/23 1701    Virgina Norfolk, DO 02/08/23 1331

## 2023-02-07 NOTE — Discharge Instructions (Addendum)
You have been given medications here to help with your alcohol withdrawal.  You are stable now for discharge.                                       Outpatient Substance Abuse  Treatment- uninsured  Narcotics Anonymous 24-HOUR HELPLINE Pre-recorded for Meeting Schedules [facility] [phone]  [email or web address] ALCOHOLICS ANONYMOUS  [facility]  Answering Service [phone] Please Note: All [place] Meetings are Non-smoking [email or web address].es  Alcohol and Drug Services -  Insurance: Medicaid /State funding/private insurance Methadone, suboxone/Intensive outpatient  [place]   [phone] Fax: [phone] [address], [place], [place], [address] [place] [phone] Fax: [phone]    [address] [place], [place], [address] ([address] [place], [place], [place], [place], [place], [place], [place], [place]) [facility] [email or web address] Accepts State funding/Medicaid Transitional housing, Intensive Outpatient Treatment, Outpatient treatment, Veterans Services  Phone: [phone] Fax: [phone] Address: [address], [place] [address]   [facility] (http://[email or web address].info/) Insurance: Medicaid Case Management, Administrator, arts, Medication Management, Outpatient Therapy, Psychosocial Rehabilitation, Substance Abuse Intensive Outpatient  Phone: [phone] Fax: [phone] [address], [place], [place], [address]  [facility] Medicaid, most private insurance providers Types of Program: Individual/Group Therapy, Substance Abuse Treatment  Phone: [place] [phone] Fax: [phone] [address], [place], [place], [address] [place] [phone] [address], [address] [place], [place], [address]  [facility]  Medicaid Types of Program: SAIOP  Phone: [phone] Fax: [phone] [place], [place], [place], [address]  [facility] Medicaid/state funds Crisis line [phone] [facility] [phone] [place] [phone] [place] [phone]  [facility] [address];  [place], [place] [address] [phone]  Substance Abuse Intensive Outpatient Program [facility] [address] [place], [place] [address] [phone]- Substance abuse treatment  Successful Trans[facility]  Insurance: [facility], [address], sliding scale Types of Program: substance abuse treatment, transportation assistance Phone: [phone] Fax: [phone] Address: [address], [place] [address] [facility] (TrendSwap[employer]ch) Insurance: UHC, [place], [place], [place] Program: addiction counseling, detoxification,  Phone: [phone]  Fax: [phone] Address: [address] [place], [place] [address]  [facility] (statewide facilities/programs) [address] (Medicaid/state funds) [place], [place] [address]                      http://[email or web address]/ [phone] Marcy Panning- [phone] [place]- [phone] [facility] [place] (2 Locations) (Medicaid/state funds) --[address]  walk in 8:30-12 and 1-2:30 [place], [id number]   [facility]- [phone] --[address] [place], [place] [address]  [phone] walk in 8:30-12 and 2-3:30  [facility] state funds/medicaid [address] [place], [place] [address] [phone] [facility] (Suboxone clinic) Medicaid/state funds  [address]  [place], [place] [address] [phone]   [facility]  [address], [place], [place] [address]  [phone] (24 hours) [place]- [address] [place], [place] [address]  [phone] (24 hours) [place]- [address] [place] [phone] [place]- [address] Rosalita Levan [phone] Turner Daniels [address] [place] [phone] [facility]- Medicaid and state  funds  [place]- [address] [place], [place] [address] [phone] (24 hours) [place]- [phone]  Evelene Croon [place], [place] [address] [phone] [facility]- [address] [place], [place] [address] [phone] (24 hours) [place] [address] [place], [place]  [phone] [place]- [id number] [place] [phone]

## 2023-02-07 NOTE — ED Notes (Signed)
Pt unable to answer admission questions. 

## 2023-02-07 NOTE — ED Notes (Signed)
Patient resting with eyes closed, he will respond to voice, however he falls back to sleep, unable to perform CIWA, Arabella Merles, PA at the bedside and is aware un able to perform CIWA at this time

## 2023-02-07 NOTE — ED Provider Notes (Signed)
Last drink this morning.  Patient drinks approximately 1 bottle of vodka every day.  Found down this morning, BIB EMS. Keep monitoring CBG, pending CT head and neck.   Did not take morning metoprolol this morning. Physical Exam  BP 126/78   Pulse 86   Temp 98.8 F (37.1 C) (Oral)   Resp 17   SpO2 93%   Physical Exam Vitals and nursing note reviewed.  Constitutional:      Appearance: Normal appearance.     Comments: Well-appearing, no acute distress.  Very mild tremor with movement.  Difficulty ambulating with two-person assist, unsteady gait.  HENT:     Head: Normocephalic and atraumatic.  Neck:     Comments: Able to rotate neck left and right 45 degrees without difficulty or pain. Cardiovascular:     Rate and Rhythm: Regular rhythm. Tachycardia present.  Pulmonary:     Effort: Pulmonary effort is normal.  Musculoskeletal:     Cervical back: Normal range of motion.  Neurological:     General: No focal deficit present.     Mental Status: He is alert.     Comments: Alert and oriented x 4.  No hallucinations, not reacting to any internal stimuli.  Psychiatric:        Mood and Affect: Mood normal.        Behavior: Behavior normal.     Procedures  Procedures  ED Course / MDM    Medical Decision Making Amount and/or Complexity of Data Reviewed Labs: ordered. Radiology: ordered.  Risk OTC drugs. Prescription drug management. Decision regarding hospitalization.   In short, this patient has a history of alcohol use disorder who is presenting with concern for alcohol withdrawal and unwitnessed fall.  He initially had tachycardia into the 120s upon arrival, and was hypertensive to 163/98.  He was overall well-appearing, able to talk coherently, nontremulous.  Send actual alcohol level was at 317.  A CT head and CT cervical spine was obtained by previous provider which showed no acute abnormalities.  He was given 1 L LR bolus, thiamine, folic acid, multivitamin, and started  on CIWA protocol.  He was given Ativan per CIWA protocol.  He was also given his home dose of metoprolol 50 mg since he did not take this dose earlier today.  Given 4 mg of Zofran through the IV for nausea.  Upon reevaluation after the Ativan, fluid bolus, patient well-appearing. Tachycardia has resolved, heart rate now in the 80s.  Blood pressure has improved to 148/87.  Patient reports feeling well. `  Patient was ambulated by nursing, they reported he was unsteady with his gait and required 2 person assist.  Patient lives by himself at home, I feel he would be unsafe to discharge at this time given he is unable to ambulate on his own and does not have any assistance at home.  He still seems slightly intoxicated with mild tremor.  Hospitalist Dr. Haroldine Laws was consulted, plan for admission to monitor patient overnight and reassess gait in the morning.  She also recommended adding a another fluid bolus.  LR bolus has been ordered.       Arabella Merles, PA-C 02/07/23 2142    Rondel Baton, MD 02/08/23 1057

## 2023-02-07 NOTE — ED Notes (Signed)
Patient was given [place] sandwich and regular sprite

## 2023-02-07 NOTE — [facility] Notes (Signed)
A consult was placed to IV Therapy for new IV access; upon arrival to the pt's room, he has a new 20ga IV near his left thumb area; flushed easily;  RN to dc the 22ga from the left hand.

## 2023-02-07 NOTE — ED Notes (Signed)
Bolus running at 500cc/hr because 20 gauge iv in the left thumb

## 2023-02-07 NOTE — ED Notes (Signed)
ED TO INPATIENT HANDOFF REPORT  ED Nurse Name and Phone #: MJ [phone]  S Name/Age/Gender Ruben [name] 72 y.o. male Room/Bed: 028C/028C  Code Status   Code Status: Full Code  Home/SNF/Other Home Patient oriented to: self and place Is this baseline? No   Triage Complete: Triage complete  Chief Complaint Alcohol withdrawal (HCC) [F10.939]  Triage Note Pt BIB EMS from home.Neighbor found pt in between houses, hx of etoh. Pt drank 2 pint bottles and a half a gallon of vodka. HR 128, HTN 180 systolic.    Allergies No Known Allergies  Level of Care/Admitting Diagnosis ED Disposition     ED Disposition  Admit   Condition  --   Comment  Hospital Area: [facility] [[id number]]  Level of Care: Telemetry Medical [[id number]]  May admit patient to [facility] or [facility] if equivalent level of care is available:: Yes  Covid Evaluation: Asymptomatic - no recent exposure (last 10 days) testing not required  Diagnosis: Alcohol withdrawal (HCC) [291.81.ICD-9-CM]  Admitting Physician: Buena Irish [[id number]]  Attending Physician: Buena Irish [phone]  Certification:: I certify this patient will need inpatient services for at least 2 midnights  Expected Medical Readiness: 02/09/2023          B Medical/Surgery History Past Medical History:  Diagnosis Date   Alcoholism (HCC)    Alcoholism (HCC)    Aortic atherosclerosis (HCC) 12/26/2016   Noted on CT   Arthritis    BPH (benign prostatic hyperplasia)    Chronic constipation    takes magnesium   Duodenal ulcer    Dyspnea 12/17/2017   ED (erectile dysfunction)    Foley catheter in place    history of no longer in place   GERD (gastroesophageal reflux disease)    Heart palpitations    Occ   Hiatal hernia    History of adenomatous polyp of colon    tubular adenoma's 2005   History of Helicobacter pylori infection    2008   Hypercholesteremia    Hypertension    MR (mitral regurgitation)    Mild,  noted on ECHO   Pre-diabetes    Pulmonary nodule 12/26/2016   noted on CT, 5 x 3 mm nodular opacity right upper lobe   Rosacea    Schatzki's ring    last dilated 02/ 2016   Urinary retention    history of   Varicocele 02/25/2014   Small to moderate left varicocele   Past Surgical History:  Procedure Laterality Date   BALLOON DILATION N/A 06/08/2014   Procedure: BALLOON DILATION;  Surgeon: Charolett Bumpers, MD;  Location: [facility];  Service: Endoscopy;  Laterality: N/A;   COLONOSCOPY WITH PROPOFOL N/A 06/08/2014   Procedure: COLONOSCOPY WITH PROPOFOL;  Surgeon: Charolett Bumpers, MD;  Location: [facility];  Service: Endoscopy;  Laterality: N/A;   ESOPHAGOGASTRODUODENOSCOPY N/A 06/08/2014   Procedure: ESOPHAGOGASTRODUODENOSCOPY (EGD);  Surgeon: Charolett Bumpers, MD;  Location: [facility];  Service: Endoscopy;  Laterality: N/A;   ESOPHAGOGASTRODUODENOSCOPY (EGD) WITH ESOPHAGEAL DILATION N/A 12/31/2012   Procedure: ESOPHAGOGASTRODUODENOSCOPY (EGD) WITH ESOPHAGEAL DILATION;  Surgeon: Charolett Bumpers, MD;  Location: [facility];  Service: Endoscopy;  Laterality: N/A;   GREEN LIGHT LASER TURP (TRANSURETHRAL RESECTION OF PROSTATE N/A 12/28/2015   Procedure: GREEN LIGHT LASER TURP (TRANSURETHRAL RESECTION OF PROSTATE;  Surgeon: Jerilee Field, MD;  Location: [facility];  Service: Urology;  Laterality: N/A;   LEFT HEART CATH AND CORONARY ANGIOGRAPHY N/A 05/16/2022   Procedure: LEFT HEART  CATH AND CORONARY ANGIOGRAPHY;  Surgeon: Elder Negus, MD;  Location: [facility];  Service: Cardiovascular;  Laterality: N/A;     A IV Location/Drains/Wounds Patient Lines/Drains/Airways Status     Active Line/Drains/Airways     Name Placement date Placement time Site Days   Peripheral IV 02/07/23 20 G 1" Anterior;Left Hand 02/07/23  1605  Hand  less than 1            Intake/Output Last 24 hours No intake or output data in the 24 hours ending 02/07/23  2320  Labs/Imaging Results for orders placed or performed during the hospital encounter of 02/07/23 (from the past 48 hour(s))  Ethanol     Status: Abnormal   Collection Time: 02/07/23  1:20 PM  Result Value Ref Range   Alcohol, Ethyl (B) 317 (HH) <10 mg/dL    Comment: CRITICAL RESULT CALLED TO, READ BACK BY AND VERIFIED WITH M. ROBERTS RN @ [phone] 02/07/23 [name],A (NOTE) Lowest detectable limit for serum alcohol is 10 mg/dL.  For medical purposes only. Performed at [facility], [address], [place], [place] [address]   CBC with Differential     Status: Abnormal   Collection Time: 02/07/23  1:20 PM  Result Value Ref Range   WBC 6.3 4.0 - 10.5 K/uL   RBC 5.63 4.22 - 5.81 MIL/uL   Hemoglobin 16.6 13.0 - 17.0 g/dL   HCT 40.9 81.1 - 91.4 %   MCV 86.9 80.0 - 100.0 fL   MCH 29.5 26.0 - 34.0 pg   MCHC 33.9 30.0 - 36.0 g/dL   RDW 78.2 (H) 95.6 - 21.3 %   Platelets 172 150 - 400 K/uL   nRBC 0.0 0.0 - 0.2 %   Neutrophils Relative % 64 %   Neutro Abs 4.0 1.7 - 7.7 K/uL   Lymphocytes Relative 25 %   Lymphs Abs 1.6 0.7 - 4.0 K/uL   Monocytes Relative 8 %   Monocytes Absolute 0.5 0.1 - 1.0 K/uL   Eosinophils Relative 2 %   Eosinophils Absolute 0.1 0.0 - 0.5 K/uL   Basophils Relative 1 %   Basophils Absolute 0.1 0.0 - 0.1 K/uL   Immature Granulocytes 0 %   Abs Immature Granulocytes 0.01 0.00 - 0.07 K/uL    Comment: Performed at [facility], [address] [place], [place] [address]  Comprehensive metabolic panel     Status: Abnormal   Collection Time: 02/07/23  1:20 PM  Result Value Ref Range   Sodium 142 135 - 145 mmol/L   Potassium 4.4 3.5 - 5.1 mmol/L   Chloride 109 98 - 111 mmol/L   CO2 17 (L) 22 - 32 mmol/L   Glucose, Bld 78 70 - 99 mg/dL    Comment: Glucose reference range applies only to samples taken after fasting for at least 8 hours.   BUN 12 8 - 23 mg/dL   Creatinine, Ser 8.46 0.61 - 1.24 mg/dL   Calcium 8.4 (L) 8.9 - 10.3 mg/dL   Total Protein 6.9  6.5 - 8.1 g/dL   Albumin 3.7 3.5 - 5.0 g/dL   AST 59 (H) 15 - 41 U/L   ALT 70 (H) 0 - 44 U/L   Alkaline Phosphatase 68 38 - 126 U/L   Total Bilirubin 0.7 0.3 - 1.2 mg/dL   GFR, Estimated >96 >29 mL/min    Comment: (NOTE) Calculated using the CKD-EPI Creatinine Equation (2021)    Anion gap 16 (H) 5 -  15    Comment: Performed at [facility], [address], [place], [place] [address]  CK     Status: None   Collection Time: 02/07/23  1:20 PM  Result Value Ref Range   Total CK 88 49 - 397 U/L    Comment: Performed at [facility], [address] [place], [place] [address]  I-Stat venous blood gas, [facility], [facility], [facility])     Status: Abnormal   Collection Time: 02/07/23  1:32 PM  Result Value Ref Range   pH, Ven 7.364 7.25 - 7.43   pCO2, Ven 33.4 (L) 44 - 60 mmHg   pO2, Ven 125 (H) 32 - 45 mmHg   Bicarbonate 19.1 (L) 20.0 - 28.0 mmol/L   TCO2 20 (L) 22 - 32 mmol/L   O2 Saturation 99 %   Acid-base deficit 5.0 (H) 0.0 - 2.0 mmol/L   Sodium 141 135 - 145 mmol/L   Potassium 4.3 3.5 - 5.1 mmol/L   Calcium, Ion 0.97 (L) 1.15 - 1.40 mmol/L   HCT 50.0 39.0 - 52.0 %   Hemoglobin 17.0 13.0 - 17.0 g/dL   Sample type VENOUS   POC CBG, ED     Status: None   Collection Time: 02/07/23  2:48 PM  Result Value Ref Range   Glucose-Capillary 98 70 - 99 mg/dL    Comment: Glucose reference range applies only to samples taken after fasting for at least 8 hours.  POC CBG, ED     Status: None   Collection Time: 02/07/23  5:47 PM  Result Value Ref Range   Glucose-Capillary 83 70 - 99 mg/dL    Comment: Glucose reference range applies only to samples taken after fasting for at least 8 hours.  POC CBG, ED     Status: Abnormal   Collection Time: 02/07/23  7:10 PM  Result Value Ref Range   Glucose-Capillary 135 (H) 70 - 99 mg/dL    Comment: Glucose reference range applies only to samples taken after fasting for at least 8 hours.   CT Head Wo Contrast  Result Date: 02/07/2023 CLINICAL DATA:   Neck Trauma EXAM: CT HEAD WITHOUT CONTRAST CT CERVICAL SPINE WITHOUT CONTRAST TECHNIQUE: Multidetector CT imaging of the head and cervical spine was performed following the standard protocol without intravenous contrast. Multiplanar CT image reconstructions of the cervical spine were also generated. RADIATION DOSE REDUCTION: This exam was performed according to the departmental dose-optimization program which includes automated exposure control, adjustment of the mA and/or kV according to patient size and/or use of iterative reconstruction technique. COMPARISON:  None Available. FINDINGS: CT HEAD FINDINGS Brain: No evidence of acute infarction, hemorrhage, hydrocephalus, extra-axial collection or mass lesion/mass effect. Vascular: No hyperdense vessel or unexpected calcification. Skull: Normal. Negative for fracture or focal lesion. Sinuses/Orbits: No middle ear or mastoid effusion. Paranasal sinuses are notable for mild mucosal thickening in the right maxillary sinus. Orbits are unremarkable. Other: None. CT CERVICAL SPINE FINDINGS Alignment: Normal. Skull base and vertebrae: No acute fracture. No primary bone lesion or focal pathologic process. Soft tissues and spinal canal: No prevertebral fluid or swelling. No visible canal hematoma. Disc levels: Mild-to-moderate spinal canal narrowing at C5-C6 secondary to OPLL. Upper chest: Negative. Other: None IMPRESSION: 1. No acute intracranial abnormality. 2. No acute fracture or traumatic subluxation of the cervical spine. Electronically Signed   By: Lorenza Cambridge M.D.   On: 02/07/2023 16:05   CT Cervical Spine Wo Contrast  Result Date: 02/07/2023 CLINICAL  DATA:  Neck Trauma EXAM: CT HEAD WITHOUT CONTRAST CT CERVICAL SPINE WITHOUT CONTRAST TECHNIQUE: Multidetector CT imaging of the head and cervical spine was performed following the standard protocol without intravenous contrast. Multiplanar CT image reconstructions of the cervical spine were also generated.  RADIATION DOSE REDUCTION: This exam was performed according to the departmental dose-optimization program which includes automated exposure control, adjustment of the mA and/or kV according to patient size and/or use of iterative reconstruction technique. COMPARISON:  None Available. FINDINGS: CT HEAD FINDINGS Brain: No evidence of acute infarction, hemorrhage, hydrocephalus, extra-axial collection or mass lesion/mass effect. Vascular: No hyperdense vessel or unexpected calcification. Skull: Normal. Negative for fracture or focal lesion. Sinuses/Orbits: No middle ear or mastoid effusion. Paranasal sinuses are notable for mild mucosal thickening in the right maxillary sinus. Orbits are unremarkable. Other: None. CT CERVICAL SPINE FINDINGS Alignment: Normal. Skull base and vertebrae: No acute fracture. No primary bone lesion or focal pathologic process. Soft tissues and spinal canal: No prevertebral fluid or swelling. No visible canal hematoma. Disc levels: Mild-to-moderate spinal canal narrowing at C5-C6 secondary to OPLL. Upper chest: Negative. Other: None IMPRESSION: 1. No acute intracranial abnormality. 2. No acute fracture or traumatic subluxation of the cervical spine. Electronically Signed   By: Lorenza Cambridge M.D.   On: 02/07/2023 16:05    Pending Labs Unresulted Labs (From admission, onward)     Start     Ordered   02/08/23 0500  Magnesium  Tomorrow morning,   R        02/07/23 2139   02/08/23 0500  Basic metabolic panel  Tomorrow morning,   R        02/07/23 2139   02/07/23 1317  Urinalysis, Routine w reflex microscopic -Urine, Clean Catch  Once,   URGENT       Question:  Specimen Source  Answer:  Urine, Clean Catch   02/07/23 1317   02/07/23 1317  Rapid urine drug screen (hospital performed)  ONCE - STAT,   STAT        02/07/23 1317            Vitals/Pain Today's Vitals   02/07/23 2242 02/07/23 2243 02/07/23 2245 02/07/23 2300  BP: 137/80  (!) 142/83 (!) 134/102  Pulse: 84  85 90   Resp:   14 16  Temp:      TempSrc:      SpO2:   100% 99%  PainSc:  0-No pain      Isolation Precautions No active isolations  Medications Medications  thiamine (VITAMIN B1) tablet 100 mg (100 mg Oral Given 02/07/23 1614)    Or  thiamine (VITAMIN B1) injection 100 mg ( Intravenous See Alternative 02/07/23 1614)  folic acid (FOLVITE) tablet 1 mg (1 mg Oral Given 02/07/23 1614)  multivitamin with minerals tablet 1 tablet (1 tablet Oral Given 02/07/23 1614)  LORazepam (ATIVAN) tablet 1-4 mg (has no administration in time range)    Or  LORazepam (ATIVAN) injection 1-4 mg (has no administration in time range)  heparin injection 5,000 Units (5,000 Units Subcutaneous Given 02/07/23 2212)  acetaminophen (TYLENOL) tablet 500 mg (has no administration in time range)    Or  acetaminophen (TYLENOL) suppository 650 mg (has no administration in time range)  ondansetron (ZOFRAN) tablet 4 mg (has no administration in time range)    Or  ondansetron (ZOFRAN) injection 4 mg (has no administration in time range)  bisacodyl (DULCOLAX) EC tablet 5 mg (has no administration in time range)  LORazepam (ATIVAN)  injection 0.5 mg (0.5 mg Intramuscular Given 02/07/23 1520)  lactated ringers bolus 1,000 mL (0 mLs Intravenous Stopped 02/07/23 1933)  metoprolol tartrate (LOPRESSOR) tablet 50 mg (50 mg Oral Given 02/07/23 1657)  ondansetron (ZOFRAN) injection 4 mg (4 mg Intravenous Given 02/07/23 1807)  lactated ringers bolus 1,000 mL (1,000 mLs Intravenous New Bag/Given 02/07/23 2142)    Mobility walks with person assist     Focused Assessments   CIWA Protocol needed frequently with medication and reassessment  R Recommendations: See Admitting Provider Note  Report given to:   Additional Notes: Patient is very unsteady walking and requires 2+ assist with ambulation, requires assistance with ADL. Please call with any other questions.

## 2023-02-07 NOTE — ED Notes (Signed)
Patient is unstable with ambulation with 2 person assist, patient assisted back to bed with assistance.

## 2023-02-07 NOTE — Progress Notes (Signed)
CSW received consult for substance abuse resources. CSW added resources to patient's AVS. ? ?Edwin Dada, MSW, LCSW ?Transitions of Care  Clinical Social Worker II ?[phone] ? ?

## 2023-02-07 NOTE — ED Triage Notes (Signed)
Pt BIB EMS from home.Neighbor found pt in between houses, hx of etoh. Pt drank 2 pint bottles and a half a gallon of vodka. HR 128, HTN 180 systolic.

## 2023-02-08 ENCOUNTER — Other Ambulatory Visit (HOSPITAL_COMMUNITY): Payer: Self-pay

## 2023-02-08 ENCOUNTER — Other Ambulatory Visit: Payer: Medicare HMO

## 2023-02-08 DIAGNOSIS — F10929 Alcohol use, unspecified with intoxication, unspecified: Secondary | ICD-10-CM | POA: Diagnosis not present

## 2023-02-08 DIAGNOSIS — F10930 Alcohol use, unspecified with withdrawal, uncomplicated: Secondary | ICD-10-CM

## 2023-02-08 DIAGNOSIS — R531 Weakness: Secondary | ICD-10-CM

## 2023-02-08 DIAGNOSIS — E785 Hyperlipidemia, unspecified: Secondary | ICD-10-CM

## 2023-02-08 DIAGNOSIS — I10 Essential (primary) hypertension: Secondary | ICD-10-CM | POA: Diagnosis not present

## 2023-02-08 LAB — RAPID URINE DRUG SCREEN, HOSP PERFORMED
Amphetamines: NOT DETECTED
Barbiturates: NOT DETECTED
Benzodiazepines: POSITIVE — AB
Cocaine: NOT DETECTED
Opiates: NOT DETECTED
Tetrahydrocannabinol: NOT DETECTED

## 2023-02-08 LAB — BASIC METABOLIC PANEL
Anion gap: 8 (ref 5–15)
BUN: 11 mg/dL (ref 8–23)
CO2: 24 mmol/L (ref 22–32)
Calcium: 8.1 mg/dL — ABNORMAL LOW (ref 8.9–10.3)
Chloride: 106 mmol/L (ref 98–111)
Creatinine, Ser: 1.03 mg/dL (ref 0.61–1.24)
GFR, Estimated: >60 mL/min (ref >60)
Glucose, Bld: 128 mg/dL — ABNORMAL HIGH (ref 70–99)
Potassium: 3.8 mmol/L (ref 3.5–5.1)
Sodium: 138 mmol/L (ref 135–145)

## 2023-02-08 LAB — URINALYSIS, ROUTINE W REFLEX MICROSCOPIC
Bilirubin Urine: NEGATIVE
Glucose, UA: NEGATIVE mg/dL
Hgb urine dipstick: NEGATIVE
Ketones, ur: 5 mg/dL — AB
Leukocytes,Ua: NEGATIVE
Nitrite: NEGATIVE
Protein, ur: NEGATIVE mg/dL
Specific Gravity, Urine: 1.017 (ref 1.005–1.030)
pH: 5 (ref 5.0–8.0)

## 2023-02-08 LAB — HEMOGLOBIN A1C
Hgb A1c MFr Bld: 6 % — ABNORMAL HIGH (ref 4.8–5.6)
Mean Plasma Glucose: 125.5 mg/dL

## 2023-02-08 LAB — MAGNESIUM: Magnesium: 1.9 mg/dL (ref 1.7–2.4)

## 2023-02-08 LAB — TSH: TSH: 1.212 u[IU]/mL (ref 0.350–4.500)

## 2023-02-08 LAB — ETHANOL: Alcohol, Ethyl (B): <10 mg/dL (ref <10)

## 2023-02-08 MED ORDER — GABAPENTIN 300 MG PO CAPS
300.0000 mg | ORAL_CAPSULE | Freq: Every day | ORAL | Status: DC
  Administered 2023-02-08 – 2023-02-09 (×3): 300 mg via ORAL
  Filled 2023-02-08 (×3): qty 1

## 2023-02-08 MED ORDER — TRAZODONE HCL 50 MG PO TABS: 50.0000 mg | ORAL_TABLET | Freq: Every evening | ORAL | Status: DC | PRN

## 2023-02-08 MED ORDER — QUETIAPINE FUMARATE 100 MG PO TABS
300.0000 mg | ORAL_TABLET | Freq: Every day | ORAL | Status: DC
  Administered 2023-02-08 – 2023-02-09 (×3): 300 mg via ORAL
  Filled 2023-02-08 (×3): qty 3

## 2023-02-08 MED ORDER — ROSUVASTATIN CALCIUM 20 MG PO TABS
20.0000 mg | ORAL_TABLET | ORAL | Status: DC
  Filled 2023-02-08: qty 1

## 2023-02-08 MED ORDER — CHLORDIAZEPOXIDE HCL 25 MG PO CAPS: 25.0000 mg | ORAL_CAPSULE | Freq: Every day | ORAL | Status: DC

## 2023-02-08 MED ORDER — CHLORDIAZEPOXIDE HCL 25 MG PO CAPS
25.0000 mg | ORAL_CAPSULE | Freq: Three times a day (TID) | ORAL | Status: AC
  Administered 2023-02-08 (×3): 25 mg via ORAL
  Filled 2023-02-08 (×3): qty 1

## 2023-02-08 MED ORDER — CLONIDINE HCL 0.1 MG PO TABS
0.1000 mg | ORAL_TABLET | Freq: Two times a day (BID) | ORAL | Status: DC
  Administered 2023-02-08 – 2023-02-10 (×6): 0.1 mg via ORAL
  Filled 2023-02-08 (×6): qty 1

## 2023-02-08 MED ORDER — METOPROLOL TARTRATE 5 MG/5ML IV SOLN: 5.0000 mg | INTRAVENOUS | Status: DC | PRN

## 2023-02-08 MED ORDER — IPRATROPIUM-ALBUTEROL 0.5-2.5 (3) MG/3ML IN SOLN: 3.0000 mL | RESPIRATORY_TRACT | Status: DC | PRN

## 2023-02-08 MED ORDER — MAGNESIUM HYDROXIDE 400 MG/5ML PO SUSP
30.0000 mL | ORAL | Status: DC
  Administered 2023-02-08 – 2023-02-10 (×2): 30 mL via ORAL
  Filled 2023-02-08 (×2): qty 30

## 2023-02-08 MED ORDER — FINASTERIDE 5 MG PO TABS
5.0000 mg | ORAL_TABLET | Freq: Every day | ORAL | Status: DC
  Administered 2023-02-08 – 2023-02-10 (×3): 5 mg via ORAL
  Filled 2023-02-08 (×3): qty 1

## 2023-02-08 MED ORDER — SENNOSIDES-DOCUSATE SODIUM 8.6-50 MG PO TABS: 1.0000 | ORAL_TABLET | Freq: Every day | ORAL | Status: DC | PRN

## 2023-02-08 MED ORDER — HYDRALAZINE HCL 20 MG/ML IJ SOLN: 10.0000 mg | INTRAMUSCULAR | Status: DC | PRN

## 2023-02-08 MED ORDER — TAMSULOSIN HCL 0.4 MG PO CAPS
0.4000 mg | ORAL_CAPSULE | Freq: Every day | ORAL | Status: DC
  Administered 2023-02-08 – 2023-02-09 (×3): 0.4 mg via ORAL
  Filled 2023-02-08 (×3): qty 1

## 2023-02-08 MED ORDER — CHLORDIAZEPOXIDE HCL 25 MG PO CAPS
25.0000 mg | ORAL_CAPSULE | Freq: Two times a day (BID) | ORAL | Status: AC
  Administered 2023-02-09 (×2): 25 mg via ORAL
  Filled 2023-02-08 (×2): qty 1

## 2023-02-08 MED ORDER — METOPROLOL TARTRATE 25 MG PO TABS
50.0000 mg | ORAL_TABLET | Freq: Two times a day (BID) | ORAL | Status: DC
  Administered 2023-02-08 – 2023-02-10 (×6): 50 mg via ORAL
  Filled 2023-02-08 (×6): qty 2

## 2023-02-08 NOTE — Plan of Care (Signed)
Care plan reviewed.

## 2023-02-08 NOTE — Care Management Obs Status (Signed)
MEDICARE OBSERVATION STATUS NOTIFICATION   Patient Details  Name: Ruben [name] MRN: [id number] Date of Birth: 04/06/1951   Medicare Observation Status Notification Given:  Yes    [name], RN 02/08/2023, 2:42 PM

## 2023-02-08 NOTE — Consult Note (Signed)
  I received a psych consult for this patient for alcohol abuse severe, inpatient rehab. The consult indicates severe alcohol abuse, but the chart review does not mention depression or suicidal ideation.  The patient is presenting for alcohol abuse.  Recently discharged on 10/26, and resume drinking post discharge.  Patient was intoxicated on arrival blood alcohol level 317, the patient is currently under the care of hospitalist for management of alcohol detox.    Given this, I recommend considering a TOC consult to address his alcohol use and inpatient rehabilitation resources.  Based on my review, I do not believe an inpatient psychiatric consult will be beneficial at this time.  Instead, inpatient rehabilitation referral is necessary.  However all things considered patient has 3 previous emergency room visit --> admission for alcohol abuse and alcohol withdrawal symptoms/complications in less than 2 months.  With all 3 visits patient initially sought help, however would leave prior to alcohol detox being completed.  Therefore rehabilitation may be more appropriate.  There appear to be no acute indicators that warrant an inpatient psych consult.  Discussed with ordering provider, and will discontinue psychiatric consult at this time.  Thank you for this consult!

## 2023-02-08 NOTE — H&P (Addendum)
History and Physical    Patient: Ruben [name] ZOX:[id number] DOB: July 06, 1950 DOA: 02/07/2023 DOS: the patient was seen and examined on 02/08/2023 PCP: Soundra Pilon, FNP  Patient coming from: Home  Chief Complaint:  Chief Complaint  Patient presents with   Alcohol Intoxication   HPI: [name] is a 72 y.o. male with medical history significant for hypertension, hyperlipidemia, BPH and alcohol abuse who was just discharged 5 days ago.  The patient says he resumed drinking immediately after discharge.  Today he was found passed out between 2 of his neighbors houses.  One of the neighbors called EMS and he was brought in for evaluation.  Patient was intoxicated on arrival.  He remained in the emergency department for several hours.  He did receive fluids and thiamine folate and multivitamin.  When he was alert and appropriate they gave him a walking test but the patient was not steady on his feet.  As a matter fact, he required 2 person standby assist.  At that point it was decided to admit him for further management.  His last drink was this morning.   Review of Systems: As mentioned in the history of present illness. All other systems reviewed and are negative. Past Medical History:  Diagnosis Date   Alcoholism (HCC)    Alcoholism (HCC)    Aortic atherosclerosis (HCC) 12/26/2016   Noted on CT   Arthritis    BPH (benign prostatic hyperplasia)    Chronic constipation    takes magnesium   Duodenal ulcer    Dyspnea 12/17/2017   ED (erectile dysfunction)    Foley catheter in place    history of no longer in place   GERD (gastroesophageal reflux disease)    Heart palpitations    Occ   Hiatal hernia    History of adenomatous polyp of colon    tubular adenoma's 2005   History of Helicobacter pylori infection    2008   Hypercholesteremia    Hypertension    MR (mitral regurgitation)    Mild, noted on ECHO   Pre-diabetes    Pulmonary nodule 12/26/2016   noted on CT, 5 x 3  mm nodular opacity right upper lobe   Rosacea    Schatzki's ring    last dilated 02/ 2016   Urinary retention    history of   Varicocele 02/25/2014   Small to moderate left varicocele   Past Surgical History:  Procedure Laterality Date   BALLOON DILATION N/A 06/08/2014   Procedure: BALLOON DILATION;  Surgeon: Charolett Bumpers, MD;  Location: [facility];  Service: Endoscopy;  Laterality: N/A;   COLONOSCOPY WITH PROPOFOL N/A 06/08/2014   Procedure: COLONOSCOPY WITH PROPOFOL;  Surgeon: Charolett Bumpers, MD;  Location: [facility];  Service: Endoscopy;  Laterality: N/A;   ESOPHAGOGASTRODUODENOSCOPY N/A 06/08/2014   Procedure: ESOPHAGOGASTRODUODENOSCOPY (EGD);  Surgeon: Charolett Bumpers, MD;  Location: [facility];  Service: Endoscopy;  Laterality: N/A;   ESOPHAGOGASTRODUODENOSCOPY (EGD) WITH ESOPHAGEAL DILATION N/A 12/31/2012   Procedure: ESOPHAGOGASTRODUODENOSCOPY (EGD) WITH ESOPHAGEAL DILATION;  Surgeon: Charolett Bumpers, MD;  Location: [facility];  Service: Endoscopy;  Laterality: N/A;   GREEN LIGHT LASER TURP (TRANSURETHRAL RESECTION OF PROSTATE N/A 12/28/2015   Procedure: GREEN LIGHT LASER TURP (TRANSURETHRAL RESECTION OF PROSTATE;  Surgeon: Jerilee Field, MD;  Location: [facility];  Service: Urology;  Laterality: N/A;   LEFT HEART CATH AND CORONARY ANGIOGRAPHY N/A 05/16/2022   Procedure: LEFT HEART CATH AND CORONARY ANGIOGRAPHY;  Surgeon: Rosemary Holms,  Anabel Bene, MD;  Location: [facility];  Service: Cardiovascular;  Laterality: N/A;   Social History:  reports that he has never smoked. He has never used smokeless tobacco. He reports current alcohol use of about 154.0 standard drinks of alcohol per week. He reports that he does not currently use drugs after having used the following drugs: Marijuana.  No Known Allergies  Family History  Problem Relation Age of Onset   Heart failure Mother    Breast cancer Mother    Stroke Father    Other Father        Colon  Issues   Lung disease Neg Hx     Prior to Admission medications   Medication Sig Start Date End Date Taking? Authorizing Provider  cloNIDine (CATAPRES) 0.1 MG tablet Take 1 tablet (0.1 mg total) by mouth 2 (two) times daily. 02/04/23  Yes Almon Hercules, MD  finasteride (PROSCAR) 5 MG tablet Take 5 mg by mouth daily.   Yes [provider]  gabapentin (NEURONTIN) 300 MG capsule Take 300 mg by mouth at bedtime.   Yes [provider]  magnesium hydroxide (MILK OF MAGNESIA) 400 MG/5ML suspension Take 30 mLs by mouth every other day.   Yes [provider]  metoprolol tartrate (LOPRESSOR) 50 MG tablet Take 50 mg by mouth 2 (two) times daily.   Yes [provider]  QUEtiapine (SEROQUEL) 300 MG tablet Take 300 mg by mouth at bedtime. 12/02/22  Yes [provider]  rosuvastatin (CRESTOR) 20 MG tablet Take 20 mg by mouth every Monday, Wednesday, and Friday. 08/23/21  Yes [provider]  senna-docusate (SENOKOT-S) 8.6-50 MG tablet Take 1 tablet by mouth daily as needed for mild constipation.   Yes [provider]  tamsulosin (FLOMAX) 0.4 MG CAPS capsule Take 0.4 mg by mouth at bedtime.   Yes [provider]  folic acid (FOLVITE) 1 MG tablet Take 1 tablet (1 mg total) by mouth daily. Patient not taking: Reported on 02/07/2023 02/05/23   Almon Hercules, MD  Multiple Vitamin (MULTIVITAMIN WITH MINERALS) TABS tablet Take 1 tablet by mouth daily. Patient not taking: Reported on 02/07/2023 02/05/23   Almon Hercules, MD  thiamine (VITAMIN B-1) 100 MG tablet Take 1 tablet (100 mg total) by mouth daily. Patient not taking: Reported on 02/07/2023 02/05/23   Almon Hercules, MD    Physical Exam: Vitals:   02/07/23 2245 02/07/23 2300 02/07/23 2330 02/07/23 2345  BP: (!) 142/83 (!) 134/102  (!) 146/87  Pulse: 85 90  85  Resp: 14 16  15   Temp:   98.3 F (36.8 C)   TempSrc:   Oral   SpO2: 100% 99%  94%   Physical Exam:  General: No acute  distress, well developed, well nourished HEENT: Normocephalic, atraumatic, PERRL Cardiovascular: Normal rate and rhythm. Distal pulses intact. Pulmonary: Normal pulmonary effort, normal breath sounds Gastrointestinal: Distended abdomen, soft, mild diffuse tenderness, normoactive bowel sounds, no organomegaly Musculoskeletal:Normal ROM, no lower ext edema Lymphadenopathy: No cervical LAD. Skin: Skin is warm and dry. Neuro: No focal deficits noted, AAOx3. PSYCH: Attentive and cooperative  Data Reviewed:  Results for orders placed or performed during the hospital encounter of 02/07/23 (from the past 24 hour(s))  Ethanol     Status: Abnormal   Collection Time: 02/07/23  1:20 PM  Result Value Ref Range   Alcohol, Ethyl (B) 317 (HH) <10 mg/dL  CBC with Differential     Status: Abnormal   Collection  Time: 02/07/23  1:20 PM  Result Value Ref Range   WBC 6.3 4.0 - 10.5 K/uL   RBC 5.63 4.22 - 5.81 MIL/uL   Hemoglobin 16.6 13.0 - 17.0 g/dL   HCT 16.1 09.6 - 04.5 %   MCV 86.9 80.0 - 100.0 fL   MCH 29.5 26.0 - 34.0 pg   MCHC 33.9 30.0 - 36.0 g/dL   RDW 40.9 (H) 81.1 - 91.4 %   Platelets 172 150 - 400 K/uL   nRBC 0.0 0.0 - 0.2 %   Neutrophils Relative % 64 %   Neutro Abs 4.0 1.7 - 7.7 K/uL   Lymphocytes Relative 25 %   Lymphs Abs 1.6 0.7 - 4.0 K/uL   Monocytes Relative 8 %   Monocytes Absolute 0.5 0.1 - 1.0 K/uL   Eosinophils Relative 2 %   Eosinophils Absolute 0.1 0.0 - 0.5 K/uL   Basophils Relative 1 %   Basophils Absolute 0.1 0.0 - 0.1 K/uL   Immature Granulocytes 0 %   Abs Immature Granulocytes 0.01 0.00 - 0.07 K/uL  Comprehensive metabolic panel     Status: Abnormal   Collection Time: 02/07/23  1:20 PM  Result Value Ref Range   Sodium 142 135 - 145 mmol/L   Potassium 4.4 3.5 - 5.1 mmol/L   Chloride 109 98 - 111 mmol/L   CO2 17 (L) 22 - 32 mmol/L   Glucose, Bld 78 70 - 99 mg/dL   BUN 12 8 - 23 mg/dL   Creatinine, Ser 7.82 0.61 - 1.24 mg/dL   Calcium 8.4 (L) 8.9 - 10.3 mg/dL    Total Protein 6.9 6.5 - 8.1 g/dL   Albumin 3.7 3.5 - 5.0 g/dL   AST 59 (H) 15 - 41 U/L   ALT 70 (H) 0 - 44 U/L   Alkaline Phosphatase 68 38 - 126 U/L   Total Bilirubin 0.7 0.3 - 1.2 mg/dL   GFR, Estimated >95 >62 mL/min   Anion gap 16 (H) 5 - 15  CK     Status: None   Collection Time: 02/07/23  1:20 PM  Result Value Ref Range   Total CK 88 49 - 397 U/L  I-Stat venous blood gas, ([facility], [facility], [facility])     Status: Abnormal   Collection Time: 02/07/23  1:32 PM  Result Value Ref Range   pH, Ven 7.364 7.25 - 7.43   pCO2, Ven 33.4 (L) 44 - 60 mmHg   pO2, Ven 125 (H) 32 - 45 mmHg   Bicarbonate 19.1 (L) 20.0 - 28.0 mmol/L   TCO2 20 (L) 22 - 32 mmol/L   O2 Saturation 99 %   Acid-base deficit 5.0 (H) 0.0 - 2.0 mmol/L   Sodium 141 135 - 145 mmol/L   Potassium 4.3 3.5 - 5.1 mmol/L   Calcium, Ion 0.97 (L) 1.15 - 1.40 mmol/L   HCT 50.0 39.0 - 52.0 %   Hemoglobin 17.0 13.0 - 17.0 g/dL   Sample type VENOUS   POC CBG, ED     Status: None   Collection Time: 02/07/23  2:48 PM  Result Value Ref Range   Glucose-Capillary 98 70 - 99 mg/dL  POC CBG, ED     Status: None   Collection Time: 02/07/23  5:47 PM  Result Value Ref Range   Glucose-Capillary 83 70 - 99 mg/dL  POC CBG, ED     Status: Abnormal   Collection Time: 02/07/23  7:10 PM  Result Value Ref Range   Glucose-Capillary  135 (H) 70 - 99 mg/dL     Assessment and Plan: ETOH abuse - the patient wants to quit.  He said he had his best success with an inpatient rehab program two years ago.  I asked if he thought it was time to go again.  He reports he has to pay cash for that place and he is not sure he has the money at this time. -Thiamine, folate, multivitamin per protocol - CIWA per protocol - Symptomatic care for insomnia  2. Htn -continue Catapres and metoprolol  3.  Hyperlipidemia - continue Crestor    Advance Care Planning:   Code Status: Full Code  The patient wants to be full code and names his cousin [name] as his  Runner, broadcasting/film/video. Her number is [phone]  Consults: None  Family Communication: None  Severity of Illness: The appropriate patient status for this patient is INPATIENT. Inpatient status is judged to be reasonable and necessary in order to provide the required intensity of service to ensure the patient's safety. The patient's presenting symptoms, physical exam findings, and initial radiographic and laboratory data in the context of their chronic comorbidities is felt to place them at high risk for further clinical deterioration. Furthermore, it is not anticipated that the patient will be medically stable for discharge from the hospital within 2 midnights of admission.   * I certify that at the point of admission it is my clinical judgment that the patient will require inpatient hospital care spanning beyond 2 midnights from the point of admission due to high intensity of service, high risk for further deterioration and high frequency of surveillance required.*  Author: Buena Irish, MD 02/08/2023 12:17 AM  For on call review [email or web address].

## 2023-02-08 NOTE — Progress Notes (Signed)
PROGRESS NOTE    Ruben [name]  YQM:[id number] DOB: January 04, 1951 DOA: 02/07/2023 PCP: Soundra Pilon, FNP   Brief Narrative:  72 year old with history of HTN, HLD, BPH, alcohol abuse was discharged about 5 days ago comes to the hospital after he was passed out between 2 of his neighbor's house and EMS was called.  Patient was intoxicated.  His recent admission was also for alcohol withdrawal.   Assessment & Plan:  Principal Problem:   Alcohol withdrawal (HCC)   Alcohol intoxication - Unfortunately patient continues to keep drinking.  Currently placed on alcohol withdrawal protocol.  Will place him on Librium taper.  Multivitamin, folic acid and thiamine.  I will consult psych to see if he is a candidate for inpatient rehab  Prior history of binocular diplopia - MRI during recent admission was negative.  Follow-up outpatient ophthalmology.  Essential hypertension - On clonidine, Toprol.  IV as needed  Peripheral neuropathy - Gabapentin  History of BPH - Flomax and Proscar   DVT prophylaxis: heparin injection 5,000 Units Start: 02/07/23 2200 Code Status: Full code Family Communication:   Status is: Inpatient Remains inpatient appropriate because: Continue hospital stay for at least 48 hours given concerns of severe alcohol withdrawal    Subjective:  Patient seen at bedside.  Drinks over half a liter of vodka daily.  No other complaints.  Examination:  General exam: Appears calm and comfortable  Respiratory system: Clear to auscultation. Respiratory effort normal. Cardiovascular system: S1 & S2 heard, RRR. No JVD, murmurs, rubs, gallops or clicks. No pedal edema. Gastrointestinal system: Abdomen is nondistended, soft and nontender. No organomegaly or masses felt. Normal bowel sounds heard. Central nervous system: Alert and oriented. No focal neurological deficits. Extremities: Symmetric 4+ x 5 power. Skin: No rashes, lesions or ulcers Psychiatry: Judgement and insight  appear normal. Mood & affect appropriate.      Diet Orders (From admission, onward)     Start     Ordered   02/07/23 2137  Diet regular Room service appropriate? Yes; Fluid consistency: Thin  Diet effective now       Question Answer Comment  Room service appropriate? Yes   Fluid consistency: Thin      02/07/23 2139            Objective: Vitals:   02/07/23 2330 02/07/23 2345 02/08/23 0025 02/08/23 0315  BP:  (!) 146/87 (!) 145/78 117/74  Pulse:  85 82 72  Resp:  15 13 15   Temp: 98.3 F (36.8 C)  98.8 F (37.1 C) 98.6 F (37 C)  TempSrc: Oral  Oral Axillary  SpO2:  94% 94% 96%  Weight:   100.7 kg   Height:   5\' 11"  (1.803 m)     Intake/Output Summary (Last 24 hours) at 02/08/2023 0813 Last data filed at 02/08/2023 0523 Gross per 24 hour  Intake --  Output 300 ml  Net -300 ml   Filed Weights   02/08/23 0025  Weight: 100.7 kg    Scheduled Meds:  cloNIDine  0.1 mg Oral BID   finasteride  5 mg Oral Daily   folic acid  1 mg Oral Daily   gabapentin  300 mg Oral QHS   heparin  5,000 Units Subcutaneous Q8H   magnesium hydroxide  30 mL Oral QODAY   metoprolol tartrate  50 mg Oral BID   multivitamin with minerals  1 tablet Oral Daily   QUEtiapine  300 mg Oral QHS   [START ON 02/10/2023] rosuvastatin  20 mg Oral Q M,W,F   tamsulosin  0.4 mg Oral QHS   thiamine  100 mg Oral Daily   Or   thiamine  100 mg Intravenous Daily   Continuous Infusions:  Nutritional status     Body mass index is 30.96 kg/m.  Data Reviewed:   CBC: Recent Labs  Lab 02/02/23 0445 02/03/23 0739 02/04/23 0607 02/07/23 1320 02/07/23 1332  WBC 11.1* 5.3 4.3 6.3  --   NEUTROABS 9.0*  --   --  4.0  --   HGB 16.5 15.7 15.1 16.6 17.0  HCT 51.2 46.9 44.8 48.9 50.0  MCV 89.4 86.5 87.3 86.9  --   PLT 222 156 116* 172  --    Basic Metabolic Panel: Recent Labs  Lab 02/02/23 0445 02/03/23 0739 02/04/23 0607 02/07/23 1320 02/07/23 1332 02/08/23 0336  NA 138 134* 134* 142 141  138  K 3.4* 4.0 3.6 4.4 4.3 3.8  CL 104 99 101 109  --  106  CO2 23 24 23  17*  --  24  GLUCOSE 112* 94 108* 78  --  128*  BUN 10 15 17 12   --  11  CREATININE 0.70 1.14 0.99 1.19  --  1.03  CALCIUM 9.1 8.5* 8.7* 8.4*  --  8.1*  MG  --   --  1.9  --   --  1.9  PHOS  --   --  2.9  --   --   --    GFR: Estimated Creatinine Clearance: 79.6 mL/min (by C-G formula based on SCr of 1.03 mg/dL). Liver Function Tests: Recent Labs  Lab 02/02/23 0445 02/04/23 0607 02/07/23 1320  AST 33 25 59*  ALT 36 29 70*  ALKPHOS 66 54 68  BILITOT 1.0 0.9 0.7  PROT 7.5 6.0* 6.9  ALBUMIN 4.1 3.3* 3.7   Recent Labs  Lab 02/02/23 0445  LIPASE 22   No results for input(s): "AMMONIA" in the last 168 hours. Coagulation Profile: No results for input(s): "INR", "PROTIME" in the last 168 hours. Cardiac Enzymes: Recent Labs  Lab 02/07/23 1320  CKTOTAL 88   BNP (last 3 results) No results for input(s): "PROBNP" in the last 8760 hours. HbA1C: No results for input(s): "HGBA1C" in the last 72 hours. CBG: Recent Labs  Lab 02/07/23 1448 02/07/23 1747 02/07/23 1910  GLUCAP 98 83 135*   Lipid Profile: No results for input(s): "CHOL", "HDL", "LDLCALC", "TRIG", "CHOLHDL", "LDLDIRECT" in the last 72 hours. Thyroid Function Tests: No results for input(s): "TSH", "T4TOTAL", "FREET4", "T3FREE", "THYROIDAB" in the last 72 hours. Anemia Panel: No results for input(s): "VITAMINB12", "FOLATE", "FERRITIN", "TIBC", "IRON", "RETICCTPCT" in the last 72 hours. Sepsis Labs: No results for input(s): "PROCALCITON", "LATICACIDVEN" in the last 168 hours.  Recent Results (from the past 240 hour(s))  MRSA Next Gen by PCR, Nasal     Status: None   Collection Time: 02/02/23  9:09 AM   Specimen: Nasal Mucosa; Nasal Swab  Result Value Ref Range Status   MRSA by PCR Next Gen NOT DETECTED NOT DETECTED Final    Comment: (NOTE) The GeneXpert MRSA Assay (FDA approved for NASAL specimens only), is one component of a  comprehensive MRSA colonization surveillance program. It is not intended to diagnose MRSA infection nor to guide or monitor treatment for MRSA infections. Test performance is not FDA approved in patients less than 53 years old. Performed at [facility], [address], [place], [place] [address]  Radiology Studies: CT Head Wo Contrast  Result Date: 02/07/2023 CLINICAL DATA:  Neck Trauma EXAM: CT HEAD WITHOUT CONTRAST CT CERVICAL SPINE WITHOUT CONTRAST TECHNIQUE: Multidetector CT imaging of the head and cervical spine was performed following the standard protocol without intravenous contrast. Multiplanar CT image reconstructions of the cervical spine were also generated. RADIATION DOSE REDUCTION: This exam was performed according to the departmental dose-optimization program which includes automated exposure control, adjustment of the mA and/or kV according to patient size and/or use of iterative reconstruction technique. COMPARISON:  None Available. FINDINGS: CT HEAD FINDINGS Brain: No evidence of acute infarction, hemorrhage, hydrocephalus, extra-axial collection or mass lesion/mass effect. Vascular: No hyperdense vessel or unexpected calcification. Skull: Normal. Negative for fracture or focal lesion. Sinuses/Orbits: No middle ear or mastoid effusion. Paranasal sinuses are notable for mild mucosal thickening in the right maxillary sinus. Orbits are unremarkable. Other: None. CT CERVICAL SPINE FINDINGS Alignment: Normal. Skull base and vertebrae: No acute fracture. No primary bone lesion or focal pathologic process. Soft tissues and spinal canal: No prevertebral fluid or swelling. No visible canal hematoma. Disc levels: Mild-to-moderate spinal canal narrowing at C5-C6 secondary to OPLL. Upper chest: Negative. Other: None IMPRESSION: 1. No acute intracranial abnormality. 2. No acute fracture or traumatic subluxation of the cervical spine. Electronically Signed   By:  Lorenza Cambridge M.D.   On: 02/07/2023 16:05   CT Cervical Spine Wo Contrast  Result Date: 02/07/2023 CLINICAL DATA:  Neck Trauma EXAM: CT HEAD WITHOUT CONTRAST CT CERVICAL SPINE WITHOUT CONTRAST TECHNIQUE: Multidetector CT imaging of the head and cervical spine was performed following the standard protocol without intravenous contrast. Multiplanar CT image reconstructions of the cervical spine were also generated. RADIATION DOSE REDUCTION: This exam was performed according to the departmental dose-optimization program which includes automated exposure control, adjustment of the mA and/or kV according to patient size and/or use of iterative reconstruction technique. COMPARISON:  None Available. FINDINGS: CT HEAD FINDINGS Brain: No evidence of acute infarction, hemorrhage, hydrocephalus, extra-axial collection or mass lesion/mass effect. Vascular: No hyperdense vessel or unexpected calcification. Skull: Normal. Negative for fracture or focal lesion. Sinuses/Orbits: No middle ear or mastoid effusion. Paranasal sinuses are notable for mild mucosal thickening in the right maxillary sinus. Orbits are unremarkable. Other: None. CT CERVICAL SPINE FINDINGS Alignment: Normal. Skull base and vertebrae: No acute fracture. No primary bone lesion or focal pathologic process. Soft tissues and spinal canal: No prevertebral fluid or swelling. No visible canal hematoma. Disc levels: Mild-to-moderate spinal canal narrowing at C5-C6 secondary to OPLL. Upper chest: Negative. Other: None IMPRESSION: 1. No acute intracranial abnormality. 2. No acute fracture or traumatic subluxation of the cervical spine. Electronically Signed   By: Lorenza Cambridge M.D.   On: 02/07/2023 16:05           LOS: 1 day   Time spent= 35 mins    Miguel Rota, MD [facility]  If 7PM-7AM, please contact night-coverage  02/08/2023, 8:13 AM

## 2023-02-08 NOTE — TOC Initial Note (Signed)
Transition of Care [facility]) - Initial/Assessment Note    Patient Details  Name: Ruben [name] MRN: [id number] Date of Birth: 09-01-1950  Transition of Care [facility]) CM/SW Contact:    [name], RN Phone Number: 02/08/2023, 2:49 PM  Clinical Narrative:                 From home alone,  has PCP and insurance on file, states has no HH services in place at this time or DME at home.  States he will call him self an uber to go home with at dc.  [name], who is a relative  is support system, states [facility] [address]  Pta self ambulatory .  NCM gave patient SA resources.    Expected Discharge Plan: Home/Self Care Barriers to Discharge: Continued Medical Work up   Patient Goals and CMS Choice Patient states their goals for this hospitalization and ongoing recovery are:: return home   Choice offered to / list presented to : NA      Expected Discharge Plan and Services In-house Referral: NA Discharge Planning Services: CM Consult Post Acute Care Choice: NA Living arrangements for the past 2 months: Single Family Home                 DME Arranged: N/A DME Agency: NA       HH Arranged: NA          Prior Living Arrangements/Services Living arrangements for the past 2 months: Single Family Home Lives with:: Self Patient language and need for interpreter reviewed:: Yes Do you feel safe going back to the place where you live?: Yes      Need for Family Participation in Patient Care: Yes (Comment) Care giver support system in place?: Yes (comment)   Criminal Activity/Legal Involvement Pertinent to Current Situation/Hospitalization: No - Comment as needed  Activities of Daily Living   ADL Screening (condition at time of admission) Independently performs ADLs?: Yes (appropriate for developmental age) Is the patient deaf or have difficulty hearing?: No Does the patient have difficulty seeing, even when wearing glasses/contacts?: No Does the  patient have difficulty concentrating, remembering, or making decisions?: No  Permission Sought/Granted Permission sought to share information with : Case Manager Permission granted to share information with : Yes, Verbal Permission Granted              Emotional Assessment Appearance:: Appears stated age Attitude/Demeanor/Rapport: Engaged Affect (typically observed): Appropriate Orientation: : Oriented to Self, Oriented to Place, Oriented to  Time, Oriented to Situation   Psych Involvement: No (comment)  Admission diagnosis:  Alcohol withdrawal (HCC) [F10.939] Alcohol abuse [F10.10] Patient Active Problem List   Diagnosis Date Noted   Encephalopathy acute 12/17/2022   Lactic acidosis 12/17/2022   Dehydration 12/17/2022   Acute lactic acidosis 12/17/2022   Abnormal stress test 05/11/2022   Elevated coronary artery calcium score 05/11/2022   Aortic atherosclerosis (HCC) 04/13/2022   Elevated LFTs    Acute kidney injury (HCC) 09/08/2020   Hyponatremia 09/08/2020   Hyperbilirubinemia 09/08/2020   Sinus tachycardia 08/16/2020   Palpitations 08/16/2020   Exertional dyspnea 08/16/2020   Mild renal insufficiency 06/23/2020   Weight loss 04/08/2020   Unsteady gait 04/08/2020   Insomnia 04/08/2020   Lump of skin of left upper extremity 04/08/2020   Irregular heart beat 04/08/2020   Pure hypercholesterolemia 04/08/2020   Alcohol use disorder, severe, dependence (HCC) 04/03/2020   Hyperlipidemia 04/03/2020   Suicidal ideation 04/03/2020  Metabolic acidosis 12/22/2019   Thrombocytopenia (HCC) 12/22/2019   ARF (acute renal failure) (HCC) 10/01/2019   Alcohol withdrawal (HCC) 10/01/2019   Normochromic normocytic anemia 10/01/2019   GERD (gastroesophageal reflux disease) 06/22/2019   BPH (benign prostatic hyperplasia) 06/22/2019   Essential hypertension 06/22/2019   AKI (acute kidney injury) (HCC)    Alcohol intoxication (HCC) 06/21/2019   Shoulder joint pain 03/31/2019    S/P Nissen fundoplication (without gastrostomy tube) procedure 04/26/2018   Rosacea 06/24/2011   PCP:  Soundra Pilon, FNP [facility]acy[phone] [place] [place] - [address] [place] [address] Phone: [phone] Fax: [phone]     Social Determinants of Health (SDOH) Social History: SDOH Screenings   Food Insecurity: No Food Insecurity (02/08/2023)  Housing: Low Risk  (02/08/2023)  Transportation Needs: No Transportation Needs (02/08/2023)  Utilities: Not At Risk (02/08/2023)  Tobacco Use: Low Risk  (02/07/2023)   SDOH Interventions:     Readmission Risk Interventions    02/08/2023    2:47 PM 02/02/2023    6:23 PM 12/19/2022   11:18 AM  Readmission Risk Prevention Plan  Transportation Screening Complete Complete Complete  PCP or Specialist Appt within 5-7 Days  Complete Complete  PCP or Specialist Appt within 3-5 Days Complete    Home Care Screening  Complete Complete  Medication Review (RN CM)  Complete Referral to Pharmacy  HRI or Home Care Consult Complete    Palliative Care Screening Not Applicable    Medication Review (RN Care Manager) Complete

## 2023-02-08 NOTE — Care Management CC44 (Signed)
Condition Code 44 Documentation Completed  Patient Details  Name: Ruben [name] MRN: [id number] Date of Birth: 17-Jan-1951   Condition Code 44 given:  Yes Patient signature on Condition Code 44 notice:  Yes Documentation of 2 MD's agreement:  Yes Code 44 added to claim:  Yes    [name], RN 02/08/2023, 2:42 PM

## 2023-02-09 DIAGNOSIS — F10930 Alcohol use, unspecified with withdrawal, uncomplicated: Secondary | ICD-10-CM | POA: Diagnosis not present

## 2023-02-09 LAB — BASIC METABOLIC PANEL
Anion gap: 12 (ref 5–15)
BUN: 14 mg/dL (ref 8–23)
CO2: 22 mmol/L (ref 22–32)
Calcium: 8.4 mg/dL — ABNORMAL LOW (ref 8.9–10.3)
Chloride: 102 mmol/L (ref 98–111)
Creatinine, Ser: 1.15 mg/dL (ref 0.61–1.24)
GFR, Estimated: >60 mL/min (ref >60)
Glucose, Bld: 183 mg/dL — ABNORMAL HIGH (ref 70–99)
Potassium: 4.1 mmol/L (ref 3.5–5.1)
Sodium: 136 mmol/L (ref 135–145)

## 2023-02-09 LAB — CBC
HCT: 41.7 % (ref 39.0–52.0)
Hemoglobin: 14.2 g/dL (ref 13.0–17.0)
MCH: 29.2 pg (ref 26.0–34.0)
MCHC: 34.1 g/dL (ref 30.0–36.0)
MCV: 85.6 fL (ref 80.0–100.0)
Platelets: 124 10*3/uL — ABNORMAL LOW (ref 150–400)
RBC: 4.87 MIL/uL (ref 4.22–5.81)
RDW: 16.6 % — ABNORMAL HIGH (ref 11.5–15.5)
WBC: 3.5 10*3/uL — ABNORMAL LOW (ref 4.0–10.5)
nRBC: 0 % (ref 0.0–0.2)

## 2023-02-09 LAB — PHOSPHORUS: Phosphorus: 2.5 mg/dL (ref 2.5–4.6)

## 2023-02-09 LAB — MAGNESIUM: Magnesium: 2.1 mg/dL (ref 1.7–2.4)

## 2023-02-09 NOTE — Hospital Course (Addendum)
  Brief Narrative:  72 year old with history of HTN, HLD, BPH, alcohol abuse was discharged about 5 days ago comes to the hospital after he was passed out between 2 of his neighbor's house and EMS was called.  Patient was intoxicated.  His recent admission was also for alcohol withdrawal.     Assessment & Plan:  Principal Problem:   Alcohol withdrawal (HCC)   Alcohol intoxication - Unfortunately patient continues to keep drinking.  Currently placed on alcohol withdrawal protocol.  Librium taper.  Multivitamin, folic acid and thiamine.  TOC to provide alcohol rehab resources.   Prior history of binocular diplopia - MRI during recent admission was negative.  Follow-up outpatient ophthalmology.   Essential hypertension - On clonidine, Toprol.  IV as needed   Peripheral neuropathy - Gabapentin   History of BPH - Flomax and Proscar     DVT prophylaxis: heparin injection 5,000 Units Start: 02/07/23 2200 Code Status: Full code Family Communication:   Status is: Inpatient Remains inpatient appropriate because: Tachycardic this morning.  If his vital signs are stabilizing over next 24-48 hours we will discharge him.     Subjective: Tachycardic this morning requiring Ativan.  No other complaints.   Examination:   General exam: Appears calm and comfortable  Respiratory system: Clear to auscultation. Respiratory effort normal. Cardiovascular system: S1 & S2 heard, RRR. No JVD, murmurs, rubs, gallops or clicks. No pedal edema. Gastrointestinal system: Abdomen is nondistended, soft and nontender. No organomegaly or masses felt. Normal bowel sounds heard. Central nervous system: Alert and oriented. No focal neurological deficits. Extremities: Symmetric 4+ x 5 power. Skin: No rashes, lesions or ulcers Psychiatry: Judgement and insight appear normal. Mood & affect appropriate.

## 2023-02-09 NOTE — Progress Notes (Signed)
Mobility Specialist Progress Note;   02/09/23 1020  Mobility  Activity Ambulated with assistance in hallway  Level of Assistance Contact guard assist, steadying assist  Assistive Device None  Distance Ambulated (ft) 400 ft  Activity Response Tolerated well  Mobility Referral Yes  $Mobility charge 1 Mobility  Mobility Specialist Start Time (ACUTE ONLY) 1020  Mobility Specialist Stop Time (ACUTE ONLY) 1035  Mobility Specialist Time Calculation (min) (ACUTE ONLY) 15 min   MD requested ambulation with pt. Pt agreeable to mobility. A bit impulsive w/ STS, slight LOB displayed but recovered quickly. Otherwise, required minG assistance during ambulation for safety. No c/o during session. Displayed fatigue at EOS d/t long distance, but stated he walked his dogs a lot PTA. Pt back in bed with all needs met.   Caesar Bookman Mobility Specialist Please contact via SecureChat or Rehab Office [phone]

## 2023-02-09 NOTE — Progress Notes (Signed)
PROGRESS NOTE    Ruben [name]  YQI:[id number] DOB: Jul 04, 1950 DOA: 02/07/2023 PCP: Soundra Pilon, FNP     Brief Narrative:  72 year old with history of HTN, HLD, BPH, alcohol abuse was discharged about 5 days ago comes to the hospital after he was passed out between 2 of his neighbor's house and EMS was called.  Patient was intoxicated.  His recent admission was also for alcohol withdrawal.     Assessment & Plan:  Principal Problem:   Alcohol withdrawal (HCC)   Alcohol intoxication - Unfortunately patient continues to keep drinking.  Currently placed on alcohol withdrawal protocol.  Librium taper.  Multivitamin, folic acid and thiamine.  TOC to provide alcohol rehab resources.   Prior history of binocular diplopia - MRI during recent admission was negative.  Follow-up outpatient ophthalmology.   Essential hypertension - On clonidine, Toprol.  IV as needed   Peripheral neuropathy - Gabapentin   History of BPH - Flomax and Proscar     DVT prophylaxis: heparin injection 5,000 Units Start: 02/07/23 2200 Code Status: Full code Family Communication:   Status is: Inpatient Remains inpatient appropriate because: Tachycardic this morning.  If his vital signs are stabilizing over next 24-48 hours we will discharge him.     Subjective: Tachycardic this morning requiring Ativan.  No other complaints.   Examination:   General exam: Appears calm and comfortable  Respiratory system: Clear to auscultation. Respiratory effort normal. Cardiovascular system: S1 & S2 heard, RRR. No JVD, murmurs, rubs, gallops or clicks. No pedal edema. Gastrointestinal system: Abdomen is nondistended, soft and nontender. No organomegaly or masses felt. Normal bowel sounds heard. Central nervous system: Alert and oriented. No focal neurological deficits. Extremities: Symmetric 4+ x 5 power. Skin: No rashes, lesions or ulcers Psychiatry: Judgement and insight appear normal. Mood & affect  appropriate.            Diet Orders (From admission, onward)     Start     Ordered   02/07/23 2137  Diet regular Room service appropriate? Yes; Fluid consistency: Thin  Diet effective now       Question Answer Comment  Room service appropriate? Yes   Fluid consistency: Thin      02/07/23 2139            Objective: Vitals:   02/08/23 1934 02/09/23 0140 02/09/23 0429 02/09/23 0846  BP: 125/81 122/71 124/72 126/75  Pulse: 85 69 65 100  Resp: 12 18 18 18   Temp: 98 F (36.7 C) 97.8 F (36.6 C) (!) 97.2 F (36.2 C) 97.6 F (36.4 C)  TempSrc: Oral Oral Oral Oral  SpO2: 98% 96% 91% 96%  Weight:   102.7 kg   Height:        Intake/Output Summary (Last 24 hours) at 02/09/2023 1230 Last data filed at 02/09/2023 1212 Gross per 24 hour  Intake --  Output 950 ml  Net -950 ml   Filed Weights   02/08/23 0025 02/09/23 0429  Weight: 100.7 kg 102.7 kg    Scheduled Meds:  chlordiazePOXIDE  25 mg Oral BID   Followed by   Melene Muller ON 02/10/2023] chlordiazePOXIDE  25 mg Oral Q1500   cloNIDine  0.1 mg Oral BID   finasteride  5 mg Oral Daily   folic acid  1 mg Oral Daily   gabapentin  300 mg Oral QHS   heparin  5,000 Units Subcutaneous Q8H   magnesium hydroxide  30 mL Oral QODAY   metoprolol tartrate  50  mg Oral BID   multivitamin with minerals  1 tablet Oral Daily   QUEtiapine  300 mg Oral QHS   [START ON 02/10/2023] rosuvastatin  20 mg Oral Q M,W,F   tamsulosin  0.4 mg Oral QHS   thiamine  100 mg Oral Daily   Or   thiamine  100 mg Intravenous Daily   Continuous Infusions:  Nutritional status     Body mass index is 31.58 kg/m.  Data Reviewed:   CBC: Recent Labs  Lab 02/03/23 0739 02/04/23 0607 02/07/23 1320 02/07/23 1332 02/09/23 1012  WBC 5.3 4.3 6.3  --  3.5*  NEUTROABS  --   --  4.0  --   --   HGB 15.7 15.1 16.6 17.0 14.2  HCT 46.9 44.8 48.9 50.0 41.7  MCV 86.5 87.3 86.9  --  85.6  PLT 156 116* 172  --  124*   Basic Metabolic Panel: Recent Labs   Lab 02/03/23 0739 02/04/23 0607 02/07/23 1320 02/07/23 1332 02/08/23 0336 02/09/23 0839  NA 134* 134* 142 141 138 136  K 4.0 3.6 4.4 4.3 3.8 4.1  CL 99 101 109  --  106 102  CO2 24 23 17*  --  24 22  GLUCOSE 94 108* 78  --  128* 183*  BUN 15 17 12   --  11 14  CREATININE 1.14 0.99 1.19  --  1.03 1.15  CALCIUM 8.5* 8.7* 8.4*  --  8.1* 8.4*  MG  --  1.9  --   --  1.9 2.1  PHOS  --  2.9  --   --   --  2.5   GFR: Estimated Creatinine Clearance: 71.9 mL/min (by C-G formula based on SCr of 1.15 mg/dL). Liver Function Tests: Recent Labs  Lab 02/04/23 0607 02/07/23 1320  AST 25 59*  ALT 29 70*  ALKPHOS 54 68  BILITOT 0.9 0.7  PROT 6.0* 6.9  ALBUMIN 3.3* 3.7   No results for input(s): "LIPASE", "AMYLASE" in the last 168 hours. No results for input(s): "AMMONIA" in the last 168 hours. Coagulation Profile: No results for input(s): "INR", "PROTIME" in the last 168 hours. Cardiac Enzymes: Recent Labs  Lab 02/07/23 1320  CKTOTAL 88   BNP (last 3 results) No results for input(s): "PROBNP" in the last 8760 hours. HbA1C: Recent Labs    02/08/23 1228  HGBA1C 6.0*   CBG: Recent Labs  Lab 02/07/23 1448 02/07/23 1747 02/07/23 1910  GLUCAP 98 83 135*   Lipid Profile: No results for input(s): "CHOL", "HDL", "LDLCALC", "TRIG", "CHOLHDL", "LDLDIRECT" in the last 72 hours. Thyroid Function Tests: Recent Labs    02/08/23 1228  TSH 1.212   Anemia Panel: No results for input(s): "VITAMINB12", "FOLATE", "FERRITIN", "TIBC", "IRON", "RETICCTPCT" in the last 72 hours. Sepsis Labs: No results for input(s): "PROCALCITON", "LATICACIDVEN" in the last 168 hours.  Recent Results (from the past 240 hour(s))  MRSA Next Gen by PCR, Nasal     Status: None   Collection Time: 02/02/23  9:09 AM   Specimen: Nasal Mucosa; Nasal Swab  Result Value Ref Range Status   MRSA by PCR Next Gen NOT DETECTED NOT DETECTED Final    Comment: (NOTE) The GeneXpert MRSA Assay (FDA approved for NASAL  specimens only), is one component of a comprehensive MRSA colonization surveillance program. It is not intended to diagnose MRSA infection nor to guide or monitor treatment for MRSA infections. Test performance is not FDA approved in patients less than 68 years old. Performed  at [facility], [address], [place], [place] [address]          Radiology Studies: CT Head Wo Contrast  Result Date: 02/07/2023 CLINICAL DATA:  Neck Trauma EXAM: CT HEAD WITHOUT CONTRAST CT CERVICAL SPINE WITHOUT CONTRAST TECHNIQUE: Multidetector CT imaging of the head and cervical spine was performed following the standard protocol without intravenous contrast. Multiplanar CT image reconstructions of the cervical spine were also generated. RADIATION DOSE REDUCTION: This exam was performed according to the departmental dose-optimization program which includes automated exposure control, adjustment of the mA and/or kV according to patient size and/or use of iterative reconstruction technique. COMPARISON:  None Available. FINDINGS: CT HEAD FINDINGS Brain: No evidence of acute infarction, hemorrhage, hydrocephalus, extra-axial collection or mass lesion/mass effect. Vascular: No hyperdense vessel or unexpected calcification. Skull: Normal. Negative for fracture or focal lesion. Sinuses/Orbits: No middle ear or mastoid effusion. Paranasal sinuses are notable for mild mucosal thickening in the right maxillary sinus. Orbits are unremarkable. Other: None. CT CERVICAL SPINE FINDINGS Alignment: Normal. Skull base and vertebrae: No acute fracture. No primary bone lesion or focal pathologic process. Soft tissues and spinal canal: No prevertebral fluid or swelling. No visible canal hematoma. Disc levels: Mild-to-moderate spinal canal narrowing at C5-C6 secondary to OPLL. Upper chest: Negative. Other: None IMPRESSION: 1. No acute intracranial abnormality. 2. No acute fracture or traumatic subluxation of the cervical  spine. Electronically Signed   By: Lorenza Cambridge M.D.   On: 02/07/2023 16:05   CT Cervical Spine Wo Contrast  Result Date: 02/07/2023 CLINICAL DATA:  Neck Trauma EXAM: CT HEAD WITHOUT CONTRAST CT CERVICAL SPINE WITHOUT CONTRAST TECHNIQUE: Multidetector CT imaging of the head and cervical spine was performed following the standard protocol without intravenous contrast. Multiplanar CT image reconstructions of the cervical spine were also generated. RADIATION DOSE REDUCTION: This exam was performed according to the departmental dose-optimization program which includes automated exposure control, adjustment of the mA and/or kV according to patient size and/or use of iterative reconstruction technique. COMPARISON:  None Available. FINDINGS: CT HEAD FINDINGS Brain: No evidence of acute infarction, hemorrhage, hydrocephalus, extra-axial collection or mass lesion/mass effect. Vascular: No hyperdense vessel or unexpected calcification. Skull: Normal. Negative for fracture or focal lesion. Sinuses/Orbits: No middle ear or mastoid effusion. Paranasal sinuses are notable for mild mucosal thickening in the right maxillary sinus. Orbits are unremarkable. Other: None. CT CERVICAL SPINE FINDINGS Alignment: Normal. Skull base and vertebrae: No acute fracture. No primary bone lesion or focal pathologic process. Soft tissues and spinal canal: No prevertebral fluid or swelling. No visible canal hematoma. Disc levels: Mild-to-moderate spinal canal narrowing at C5-C6 secondary to OPLL. Upper chest: Negative. Other: None IMPRESSION: 1. No acute intracranial abnormality. 2. No acute fracture or traumatic subluxation of the cervical spine. Electronically Signed   By: Lorenza Cambridge M.D.   On: 02/07/2023 16:05           LOS: 1 day   Time spent= 35 mins    Miguel Rota, MD [facility]  If 7PM-7AM, please contact night-coverage  02/09/2023, 12:30 PM

## 2023-02-09 NOTE — Progress Notes (Signed)
Mobility Specialist Progress Note;   02/09/23 1550  Mobility  Activity Ambulated independently in hallway  Level of Assistance Modified independent, requires aide device or extra time  Assistive Device None  Distance Ambulated (ft) 400 ft  Activity Response Tolerated well  Mobility Referral Yes  $Mobility charge 1 Mobility  Mobility Specialist Start Time (ACUTE ONLY) 1550  Mobility Specialist Stop Time (ACUTE ONLY) 1600  Mobility Specialist Time Calculation (min) (ACUTE ONLY) 10 min   Pt requesting to ambulate again today. Required no physical assistance during ambulation, ModI. Asx throughout session and no c/o. Pt back in bed with all needs met.   Caesar Bookman Mobility Specialist Please contact via SecureChat or Rehab Office [phone]

## 2023-02-09 NOTE — Plan of Care (Signed)
?  Problem: Education: ?Goal: Knowledge of General Education information will improve ?Description: Including pain rating scale, medication(s)/side effects and non-pharmacologic comfort measures ?Outcome: Progressing ?  ?Problem: Health Behavior/Discharge Planning: ?Goal: Ability to manage health-related needs will improve ?Outcome: Progressing ?  ?Problem: Clinical Measurements: ?Goal: Will remain free from infection ?Outcome: Progressing ?  ?Problem: Clinical Measurements: ?Goal: Diagnostic test results will improve ?Outcome: Progressing ?  ?Problem: Clinical Measurements: ?Goal: Respiratory complications will improve ?Outcome: Progressing ?  ?Problem: Clinical Measurements: ?Goal: Cardiovascular complication will be avoided ?Outcome: Progressing ?  ?

## 2023-02-10 DIAGNOSIS — F10930 Alcohol use, unspecified with withdrawal, uncomplicated: Secondary | ICD-10-CM | POA: Diagnosis not present

## 2023-02-10 LAB — BASIC METABOLIC PANEL
Anion gap: 8 (ref 5–15)
BUN: 9 mg/dL (ref 8–23)
CO2: 24 mmol/L (ref 22–32)
Calcium: 8.5 mg/dL — ABNORMAL LOW (ref 8.9–10.3)
Chloride: 106 mmol/L (ref 98–111)
Creatinine, Ser: 0.96 mg/dL (ref 0.61–1.24)
GFR, Estimated: >60 mL/min (ref >60)
Glucose, Bld: 109 mg/dL — ABNORMAL HIGH (ref 70–99)
Potassium: 3.7 mmol/L (ref 3.5–5.1)
Sodium: 138 mmol/L (ref 135–145)

## 2023-02-10 LAB — CBC
HCT: 40.7 % (ref 39.0–52.0)
Hemoglobin: 13.6 g/dL (ref 13.0–17.0)
MCH: 28.6 pg (ref 26.0–34.0)
MCHC: 33.4 g/dL (ref 30.0–36.0)
MCV: 85.5 fL (ref 80.0–100.0)
Platelets: 128 10*3/uL — ABNORMAL LOW (ref 150–400)
RBC: 4.76 MIL/uL (ref 4.22–5.81)
RDW: 16.2 % — ABNORMAL HIGH (ref 11.5–15.5)
WBC: 3.4 10*3/uL — ABNORMAL LOW (ref 4.0–10.5)
nRBC: 0 % (ref 0.0–0.2)

## 2023-02-10 LAB — MAGNESIUM: Magnesium: 2 mg/dL (ref 1.7–2.4)

## 2023-02-10 NOTE — Discharge Summary (Signed)
Physician Discharge Summary  KRISTIAN MOGG ZOX:[id number] DOB: [birth year] DOA: 02/07/2023  PCP: Soundra Pilon, FNP  Admit date: 02/07/2023 Discharge date: 02/10/2023  Admitted From: Home Disposition: Home  Recommendations for Outpatient Follow-up:  Follow up with PCP in 1-2 weeks Please obtain BMP/CBC in one week your next doctors visit.  Counseled to quit using alcohol   Discharge Condition: Stable CODE STATUS: Full code Diet recommendation: Low-salt    Brief Narrative:  72 year old with history of HTN, HLD, BPH, alcohol abuse was discharged about 5 days ago comes to the hospital after he was passed out between 2 of his neighbor's house and EMS was called.  Patient was intoxicated.  His recent admission was also for alcohol withdrawal.  During the hospitalization he was also started on Librium, multivitamin, folic acid and thiamine.  Resources for alcohol rehab numbers provided.  Medically stable for discharge.     Assessment & Plan:  Principal Problem:   Alcohol withdrawal (HCC)   Alcohol intoxication - Unfortunately patient continues to keep drinking.  Currently placed on alcohol withdrawal protocol.  Librium taper.  Multivitamin, folic acid and thiamine.  TOC to provide alcohol rehab resources.   Prior history of binocular diplopia - MRI during recent admission was negative.  Follow-up outpatient ophthalmology.   Essential hypertension - On clonidine, Toprol.  IV as needed   Peripheral neuropathy - Gabapentin   History of BPH - Flomax and Proscar     DVT prophylaxis: heparin injection 5,000 Units Start: 02/07/23 2200 Code Status: Full code Family Communication:   Status is: Inpatient Will discharge patient today     Subjective: No complaints.  Wishes to go home.   Examination:   General exam: Appears calm and comfortable  Respiratory system: Clear to auscultation. Respiratory effort normal. Cardiovascular system: S1 & S2 heard, RRR. No JVD, murmurs,  rubs, gallops or clicks. No pedal edema. Gastrointestinal system: Abdomen is nondistended, soft and nontender. No organomegaly or masses felt. Normal bowel sounds heard. Central nervous system: Alert and oriented. No focal neurological deficits. Extremities: Symmetric 4+ x 5 power. Skin: No rashes, lesions or ulcers Psychiatry: Judgement and insight appear normal. Mood & affect appropriate.     Discharge Diagnoses:  Principal Problem:   Alcohol withdrawal (HCC)      Discharge Exam: Vitals:   02/10/23 0327 02/10/23 0905  BP: 129/81 126/83  Pulse: (!) 56 96  Resp: 17   Temp: (!) 97.3 F (36.3 C)   SpO2: 95%    Vitals:   02/09/23 2038 02/09/23 2348 02/10/23 0327 02/10/23 0905  BP: 120/79 123/77 129/81 126/83  Pulse: 67 60 (!) 56 96  Resp: 17 16 17    Temp: 97.9 F (36.6 C) 97.9 F (36.6 C) (!) 97.3 F (36.3 C)   TempSrc: Oral Oral Axillary   SpO2: 95% 94% 95%   Weight:   103.7 kg   Height:          Discharge Instructions   Allergies as of 02/10/2023   No Known Allergies      Medication List     TAKE these medications    cloNIDine 0.1 MG tablet Commonly known as: CATAPRES Take 1 tablet (0.1 mg total) by mouth 2 (two) times daily.   finasteride 5 MG tablet Commonly known as: PROSCAR Take 5 mg by mouth daily.   folic acid 1 MG tablet Commonly known as: FOLVITE Take 1 tablet (1 mg total) by mouth daily.   gabapentin 300 MG capsule Commonly known as: NEURONTIN  Take 300 mg by mouth at bedtime.   metoprolol tartrate 50 MG tablet Commonly known as: LOPRESSOR Take 50 mg by mouth 2 (two) times daily.   Milk of Magnesia 2400 MG/30ML suspension Generic drug: magnesium hydroxide Take 30 mLs by mouth every other day.   multivitamin with minerals Tabs tablet Take 1 tablet by mouth daily.   QUEtiapine 300 MG tablet Commonly known as: SEROQUEL Take 300 mg by mouth at bedtime.   rosuvastatin 20 MG tablet Commonly known as: CRESTOR Take 20 mg by mouth  every Monday, Wednesday, and Friday.   senna-docusate 8.6-50 MG tablet Commonly known as: Senokot-S Take 1 tablet by mouth daily as needed for mild constipation.   tamsulosin 0.4 MG Caps capsule Commonly known as: FLOMAX Take 0.4 mg by mouth at bedtime.   thiamine 100 MG tablet Commonly known as: Vitamin B-1 Take 1 tablet (100 mg total) by mouth daily.        Follow-up Information     Soundra Pilon, FNP Follow up.   Specialty: Family Medicine Contact information: [phone] [address] [place] [address] [phone]         Soundra Pilon, FNP .   Specialty: Family Medicine Contact information: [address] [place] [address] [phone]                No Known Allergies  You were cared for by a hospitalist during your hospital stay. If you have any questions about your discharge medications or the care you received while you were in the hospital after you are discharged, you can call the unit and asked to speak with the hospitalist on call if the hospitalist that took care of you is not available. Once you are discharged, your primary care physician will handle any further medical issues. Please note that no refills for any discharge medications will be authorized once you are discharged, as it is imperative that you return to your primary care physician (or establish a relationship with a primary care physician if you do not have one) for your aftercare needs so that they can reassess your need for medications and monitor your lab values.  You were cared for by a hospitalist during your hospital stay. If you have any questions about your discharge medications or the care you received while you were in the hospital after you are discharged, you can call the unit and asked to speak with the hospitalist on call if the hospitalist that took care of you is not available. Once you are discharged, your primary care physician will handle any further  medical issues. Please note that NO REFILLS for any discharge medications will be authorized once you are discharged, as it is imperative that you return to your primary care physician (or establish a relationship with a primary care physician if you do not have one) for your aftercare needs so that they can reassess your need for medications and monitor your lab values.  Please request your Prim.MD to go over all Hospital Tests and Procedure/Radiological results at the follow up, please get all Hospital records sent to your Prim MD by signing hospital release before you go home.  Get CBC, CMP, 2 view Chest X ray checked  by Primary MD during your next visit or SNF MD in 5-7 days ( we routinely change or add medications that can affect your baseline labs and fluid status, therefore we recommend that you get the mentioned basic workup next visit  with your PCP, your PCP may decide not to get them or add new tests based on their clinical decision)  On your next visit with your primary care physician please Get Medicines reviewed and adjusted.  If you experience worsening of your admission symptoms, develop shortness of breath, life threatening emergency, suicidal or homicidal thoughts you must seek medical attention immediately by calling 911 or calling your MD immediately  if symptoms less severe.  You Must read complete instructions/literature along with all the possible adverse reactions/side effects for all the Medicines you take and that have been prescribed to you. Take any new Medicines after you have completely understood and accpet all the possible adverse reactions/side effects.   Do not drive, operate heavy machinery, perform activities at heights, swimming or participation in water activities or provide baby sitting services if your were admitted for syncope or siezures until you have seen by Primary MD or a Neurologist and advised to do so again.  Do not drive when taking Pain  medications.   Procedures/Studies: CT Head Wo Contrast  Result Date: 02/07/2023 CLINICAL DATA:  Neck Trauma EXAM: CT HEAD WITHOUT CONTRAST CT CERVICAL SPINE WITHOUT CONTRAST TECHNIQUE: Multidetector CT imaging of the head and cervical spine was performed following the standard protocol without intravenous contrast. Multiplanar CT image reconstructions of the cervical spine were also generated. RADIATION DOSE REDUCTION: This exam was performed according to the departmental dose-optimization program which includes automated exposure control, adjustment of the mA and/or kV according to patient size and/or use of iterative reconstruction technique. COMPARISON:  None Available. FINDINGS: CT HEAD FINDINGS Brain: No evidence of acute infarction, hemorrhage, hydrocephalus, extra-axial collection or mass lesion/mass effect. Vascular: No hyperdense vessel or unexpected calcification. Skull: Normal. Negative for fracture or focal lesion. Sinuses/Orbits: No middle ear or mastoid effusion. Paranasal sinuses are notable for mild mucosal thickening in the right maxillary sinus. Orbits are unremarkable. Other: None. CT CERVICAL SPINE FINDINGS Alignment: Normal. Skull base and vertebrae: No acute fracture. No primary bone lesion or focal pathologic process. Soft tissues and spinal canal: No prevertebral fluid or swelling. No visible canal hematoma. Disc levels: Mild-to-moderate spinal canal narrowing at C5-C6 secondary to OPLL. Upper chest: Negative. Other: None IMPRESSION: 1. No acute intracranial abnormality. 2. No acute fracture or traumatic subluxation of the cervical spine. Electronically Signed   By: Lorenza Cambridge M.D.   On: 02/07/2023 16:05   CT Cervical Spine Wo Contrast  Result Date: 02/07/2023 CLINICAL DATA:  Neck Trauma EXAM: CT HEAD WITHOUT CONTRAST CT CERVICAL SPINE WITHOUT CONTRAST TECHNIQUE: Multidetector CT imaging of the head and cervical spine was performed following the standard protocol without  intravenous contrast. Multiplanar CT image reconstructions of the cervical spine were also generated. RADIATION DOSE REDUCTION: This exam was performed according to the departmental dose-optimization program which includes automated exposure control, adjustment of the mA and/or kV according to patient size and/or use of iterative reconstruction technique. COMPARISON:  None Available. FINDINGS: CT HEAD FINDINGS Brain: No evidence of acute infarction, hemorrhage, hydrocephalus, extra-axial collection or mass lesion/mass effect. Vascular: No hyperdense vessel or unexpected calcification. Skull: Normal. Negative for fracture or focal lesion. Sinuses/Orbits: No middle ear or mastoid effusion. Paranasal sinuses are notable for mild mucosal thickening in the right maxillary sinus. Orbits are unremarkable. Other: None. CT CERVICAL SPINE FINDINGS Alignment: Normal. Skull base and vertebrae: No acute fracture. No primary bone lesion or focal pathologic process. Soft tissues and spinal canal: No prevertebral fluid or swelling. No visible canal hematoma. Disc levels: Mild-to-moderate  spinal canal narrowing at C5-C6 secondary to OPLL. Upper chest: Negative. Other: None IMPRESSION: 1. No acute intracranial abnormality. 2. No acute fracture or traumatic subluxation of the cervical spine. Electronically Signed   By: Lorenza Cambridge M.D.   On: 02/07/2023 16:05   MR BRAIN WO CONTRAST  Result Date: 02/03/2023 CLINICAL DATA:  72 year old male with neurologic deficit. Blurred vision. EXAM: MRI HEAD WITHOUT CONTRAST TECHNIQUE: Multiplanar, multiecho pulse sequences of the brain and surrounding structures were obtained without intravenous contrast. COMPARISON:  Head CT 12/17/2022. FINDINGS: Brain: No restricted diffusion to suggest acute infarction. No midline shift, mass effect, evidence of mass lesion, ventriculomegaly, extra-axial collection or acute intracranial hemorrhage. Cervicomedullary junction and pituitary are within normal  limits. Largely normal for age gray and white matter signal. Scattered minimal nonspecific white matter T2 and FLAIR hyperintensity. No cortical encephalomalacia or chronic cerebral blood products identified. Deep gray nuclei, brainstem, cerebellum appear negative. Vascular: Major intracranial vascular flow voids are preserved. Skull and upper cervical spine: Negative visible cervical spine. Visualized bone marrow signal is within normal limits. Sinuses/Orbits: Chronic right frontal sinus retention cyst (series 9, image 29). Other paranasal sinuses are stable and well aerated. Normal suprasellar cistern. Normal optic chiasm. Negative noncontrast appearance of the cavernous sinus. Orbits soft tissues appears symmetric and normal. Other: Mastoids are clear. Visible internal auditory structures appear normal. Negative visible scalp and face. IMPRESSION: No acute intracranial abnormality. Essentially normal for age noncontrast MRI appearance of the Brain. Electronically Signed   By: Odessa Fleming M.D.   On: 02/03/2023 13:31     The results of significant diagnostics from this hospitalization (including imaging, microbiology, ancillary and laboratory) are listed below for reference.     Microbiology: Recent Results (from the past 240 hour(s))  MRSA Next Gen by PCR, Nasal     Status: None   Collection Time: 02/02/23  9:09 AM   Specimen: Nasal Mucosa; Nasal Swab  Result Value Ref Range Status   MRSA by PCR Next Gen NOT DETECTED NOT DETECTED Final    Comment: (NOTE) The GeneXpert MRSA Assay (FDA approved for NASAL specimens only), is one component of a comprehensive MRSA colonization surveillance program. It is not intended to diagnose MRSA infection nor to guide or monitor treatment for MRSA infections. Test performance is not FDA approved in patients less than 58 years old. Performed at [facility], [address], [place], [place] [address]      Labs: BNP (last 3 results) No  results for input(s): "BNP" in the last 8760 hours. Basic Metabolic Panel: Recent Labs  Lab 02/04/23 0607 02/07/23 1320 02/07/23 1332 02/08/23 0336 02/09/23 0839 02/10/23 0234  NA 134* 142 141 138 136 138  K 3.6 4.4 4.3 3.8 4.1 3.7  CL 101 109  --  106 102 106  CO2 23 17*  --  24 22 24   GLUCOSE 108* 78  --  128* 183* 109*  BUN 17 12  --  11 14 9   CREATININE 0.99 1.19  --  1.03 1.15 0.96  CALCIUM 8.7* 8.4*  --  8.1* 8.4* 8.5*  MG 1.9  --   --  1.9 2.1 2.0  PHOS 2.9  --   --   --  2.5  --    Liver Function Tests: Recent Labs  Lab 02/04/23 0607 02/07/23 1320  AST 25 59*  ALT 29 70*  ALKPHOS 54 68  BILITOT 0.9 0.7  PROT 6.0* 6.9  ALBUMIN 3.3* 3.7   No results  for input(s): "LIPASE", "AMYLASE" in the last 168 hours. No results for input(s): "AMMONIA" in the last 168 hours. CBC: Recent Labs  Lab 02/04/23 0607 02/07/23 1320 02/07/23 1332 02/09/23 1012 02/10/23 0234  WBC 4.3 6.3  --  3.5* 3.4*  NEUTROABS  --  4.0  --   --   --   HGB 15.1 16.6 17.0 14.2 13.6  HCT 44.8 48.9 50.0 41.7 40.7  MCV 87.3 86.9  --  85.6 85.5  PLT 116* 172  --  124* 128*   Cardiac Enzymes: Recent Labs  Lab 02/07/23 1320  CKTOTAL 88   BNP: Invalid input(s): "POCBNP" CBG: Recent Labs  Lab 02/07/23 1448 02/07/23 1747 02/07/23 1910  GLUCAP 98 83 135*   D-Dimer No results for input(s): "DDIMER" in the last 72 hours. Hgb A1c Recent Labs    02/08/23 1228  HGBA1C 6.0*   Lipid Profile No results for input(s): "CHOL", "HDL", "LDLCALC", "TRIG", "CHOLHDL", "LDLDIRECT" in the last 72 hours. Thyroid function studies Recent Labs    02/08/23 1228  TSH 1.212   Anemia work up No results for input(s): "VITAMINB12", "FOLATE", "FERRITIN", "TIBC", "IRON", "RETICCTPCT" in the last 72 hours. Urinalysis    Component Value Date/Time   COLORURINE YELLOW 02/08/2023 0535   APPEARANCEUR CLEAR 02/08/2023 0535   LABSPEC 1.017 02/08/2023 0535   PHURINE 5.0 02/08/2023 0535   GLUCOSEU NEGATIVE  02/08/2023 0535   HGBUR NEGATIVE 02/08/2023 0535   BILIRUBINUR NEGATIVE 02/08/2023 0535   KETONESUR 5 (A) 02/08/2023 0535   PROTEINUR NEGATIVE 02/08/2023 0535   NITRITE NEGATIVE 02/08/2023 0535   LEUKOCYTESUR NEGATIVE 02/08/2023 0535   Sepsis Labs Recent Labs  Lab 02/04/23 0607 02/07/23 1320 02/09/23 1012 02/10/23 0234  WBC 4.3 6.3 3.5* 3.4*   Microbiology Recent Results (from the past 240 hour(s))  MRSA Next Gen by PCR, Nasal     Status: None   Collection Time: 02/02/23  9:09 AM   Specimen: Nasal Mucosa; Nasal Swab  Result Value Ref Range Status   MRSA by PCR Next Gen NOT DETECTED NOT DETECTED Final    Comment: (NOTE) The GeneXpert MRSA Assay (FDA approved for NASAL specimens only), is one component of a comprehensive MRSA colonization surveillance program. It is not intended to diagnose MRSA infection nor to guide or monitor treatment for MRSA infections. Test performance is not FDA approved in patients less than 80 years old. Performed at [facility], [address] [place], [place] [address]      Time coordinating discharge:  I have spent 35 minutes face to face with the patient and on the ward discussing the patients care, assessment, plan and disposition with other care givers. >50% of the time was devoted counseling the patient about the risks and benefits of treatment/Discharge disposition and coordinating care.   SIGNED:   Miguel Rota, MD  [facility] 02/10/2023, 10:28 AM   If 7PM-7AM, please contact night-coverage

## 2023-02-10 NOTE — Plan of Care (Signed)

## 2023-02-20 NOTE — Patient Instructions (Signed)
DUE TO COVID-19 ONLY TWO VISITORS  (aged 71 and older)  ARE ALLOWED TO COME WITH YOU AND STAY IN THE WAITING ROOM ONLY DURING PRE OP AND PROCEDURE.   **NO VISITORS ARE ALLOWED IN THE SHORT STAY AREA OR RECOVERY ROOM!!**  IF YOU WILL BE ADMITTED INTO THE HOSPITAL YOU ARE ALLOWED ONLY FOUR SUPPORT PEOPLE DURING VISITATION HOURS ONLY (7 AM -8PM)   The support person(s) must pass our screening, gel in and out, and wear a mask at all times, including in the patient's room. Patients must also wear a mask when staff or their support person are in the room. Visitors GUEST BADGE MUST BE WORN VISIBLY  One adult visitor may remain with you overnight and MUST be in the room by 8 P.M.     Your procedure is scheduled on: 02/27/23   Report to [facility] Main Entrance    Report to admitting at: 5:15 AM   Call this number if you have problems the morning of surgery [phone]   Do not eat food or drink: After Midnight.  FOLLOW BOWEL PREP AND ANY ADDITIONAL PRE OP INSTRUCTIONS YOU RECEIVED FROM YOUR SURGEON'S OFFICE!!!   Oral Hygiene is also important to reduce your risk of infection.                                    Remember - BRUSH YOUR TEETH THE MORNING OF SURGERY WITH YOUR REGULAR TOOTHPASTE  DENTURES WILL BE REMOVED PRIOR TO SURGERY PLEASE DO NOT APPLY "Poly grip" OR ADHESIVES!!!   Do NOT smoke after Midnight   Take these medicines the morning of surgery with A SIP OF WATER: clonidine,metoprolol,finasteride.  Bring CPAP mask and tubing day of surgery.                              You may not have any metal on your body including hair pins, jewelry, and body piercing             Do not wear lotions, powders, perfumes/cologne, or deodorant              Men may shave face and neck.   Do not bring valuables to the hospital. [facility] IS NOT             RESPONSIBLE   FOR VALUABLES.   Contacts, glasses, or bridgework may not be worn into surgery.   Bring small overnight bag  day of surgery.   DO NOT BRING YOUR HOME MEDICATIONS TO THE HOSPITAL. PHARMACY WILL DISPENSE MEDICATIONS LISTED ON YOUR MEDICATION LIST TO YOU DURING YOUR ADMISSION IN THE HOSPITAL!    Patients discharged on the day of surgery will not be allowed to drive home.  Someone NEEDS to stay with you for the first 24 hours after anesthesia.   Special Instructions: Bring a copy of your healthcare power of attorney and living will documents         the day of surgery if you haven't scanned them before.              Please read over the following fact sheets you were given: IF YOU HAVE QUESTIONS ABOUT YOUR PRE-OP INSTRUCTIONS PLEASE CALL [phone]    [facility] - Preparing for Surgery Before surgery, you can play an important role.  Because skin is not sterile, your skin needs to  be as free of germs as possible.  You can reduce the number of germs on your skin by washing with CHG (chlorahexidine gluconate) soap before surgery.  CHG is an antiseptic cleaner which kills germs and bonds with the skin to continue killing germs even after washing. Please DO NOT use if you have an allergy to CHG or antibacterial soaps.  If your skin becomes reddened/irritated stop using the CHG and inform your nurse when you arrive at Short Stay. Do not shave (including legs and underarms) for at least 48 hours prior to the first CHG shower.  You may shave your face/neck. Please follow these instructions carefully:  1.  Shower with CHG Soap the night before surgery and the  morning of Surgery.  2.  If you choose to wash your hair, wash your hair first as usual with your  normal  shampoo.  3.  After you shampoo, rinse your hair and body thoroughly to remove the  shampoo.                           4.  Use CHG as you would any other liquid soap.  You can apply chg directly  to the skin and wash                       Gently with a scrungie or clean washcloth.  5.  Apply the CHG Soap to your body ONLY FROM THE NECK DOWN.   Do not  use on face/ open                           Wound or open sores. Avoid contact with eyes, ears mouth and genitals (private parts).                       Wash face,  Genitals (private parts) with your normal soap.             6.  Wash thoroughly, paying special attention to the area where your surgery  will be performed.  7.  Thoroughly rinse your body with warm water from the neck down.  8.  DO NOT shower/wash with your normal soap after using and rinsing off  the CHG Soap.                9.  Pat yourself dry with a clean towel.            10.  Wear clean pajamas.            11.  Place clean sheets on your bed the night of your first shower and do not  sleep with pets. Day of Surgery : Do not apply any lotions/deodorants the morning of surgery.  Please wear clean clothes to the hospital/surgery center.  FAILURE TO FOLLOW THESE INSTRUCTIONS MAY RESULT IN THE CANCELLATION OF YOUR SURGERY PATIENT SIGNATURE_________________________________  NURSE SIGNATURE__________________________________  ________________________________________________________________________

## 2023-02-21 ENCOUNTER — Encounter (HOSPITAL_COMMUNITY): Payer: Self-pay

## 2023-02-21 ENCOUNTER — Other Ambulatory Visit: Payer: Self-pay

## 2023-02-21 ENCOUNTER — Encounter (HOSPITAL_COMMUNITY)
Admission: RE | Admit: 2023-02-21 | Discharge: 2023-02-21 | Disposition: A | Discharge status: Home / Self Care | Payer: Medicare HMO | Source: Ambulatory Visit | Attending: Urology | Admitting: Urology

## 2023-02-21 DIAGNOSIS — K449 Diaphragmatic hernia without obstruction or gangrene: Secondary | ICD-10-CM | POA: Diagnosis not present

## 2023-02-21 DIAGNOSIS — Z01818 Encounter for other preprocedural examination: Secondary | ICD-10-CM | POA: Insufficient documentation

## 2023-02-21 DIAGNOSIS — I10 Essential (primary) hypertension: Secondary | ICD-10-CM | POA: Insufficient documentation

## 2023-02-21 DIAGNOSIS — F419 Anxiety disorder, unspecified: Secondary | ICD-10-CM | POA: Diagnosis not present

## 2023-02-21 DIAGNOSIS — K760 Fatty (change of) liver, not elsewhere classified: Secondary | ICD-10-CM | POA: Insufficient documentation

## 2023-02-21 DIAGNOSIS — I700 Atherosclerosis of aorta: Secondary | ICD-10-CM | POA: Insufficient documentation

## 2023-02-21 DIAGNOSIS — J9811 Atelectasis: Secondary | ICD-10-CM | POA: Insufficient documentation

## 2023-02-21 DIAGNOSIS — K219 Gastro-esophageal reflux disease without esophagitis: Secondary | ICD-10-CM | POA: Insufficient documentation

## 2023-02-21 DIAGNOSIS — N400 Enlarged prostate without lower urinary tract symptoms: Secondary | ICD-10-CM | POA: Insufficient documentation

## 2023-02-21 DIAGNOSIS — G4733 Obstructive sleep apnea (adult) (pediatric): Secondary | ICD-10-CM | POA: Diagnosis not present

## 2023-02-21 HISTORY — DX: Palpitations: R00.2

## 2023-02-21 HISTORY — DX: Depression, unspecified: F32.A

## 2023-02-21 HISTORY — DX: Anxiety disorder, unspecified: F41.9

## 2023-02-21 NOTE — Progress Notes (Signed)
For Anesthesia: PCP - Soundra Pilon, FNP  Cardiologist - Elder Negus, MD  . Theron Arista: 05/11/22 Pulmonologist: Dr. Durel Salts. LOV: 01/25/23   Chest x-ray - 08/15/22. CT Chest: 12/17/22 EKG - 02/02/23 Stress Test - 04/25/22 ECHO - 04/27/22 Cardiac Cath - 05/16/22 Pacemaker/ICD device last checked: Pacemaker orders received: Device Rep notified:  Spinal Cord Stimulator: N/A  Sleep Study - Yes CPAP - Yes  Fasting Blood Sugar - N/A Checks Blood Sugar _____ times a day Date and result of last Hgb A1c-6.0: 02/08/23  Last dose of GLP1 agonist- N/A GLP1 instructions:   Last dose of SGLT-2 inhibitors- N/A SGLT-2 instructions:   Blood Thinner Instructions: N/A Aspirin Instructions: Last Dose:  Activity level: Can go up a flight of stairs and activities of daily living without stopping and without chest pain and/or shortness of breath   Able to exercise without chest pain and/or shortness of breath  Anesthesia review: Hx: HTN,Dyspnea,palpitations,Pre-DIA,OSA(CPAP),Recent admission for alcohol intoxication (2 admissions). As per pt. He has a lot of difficulty swallowing,even liquids,he had several balloon dilations done,but he said it does not help.  Patient denies shortness of breath, fever, cough and chest pain at PAT appointment   Patient verbalized understanding of instructions that were given to them at the PAT appointment. Patient was also instructed that they will need to review over the PAT instructions again at home before surgery.

## 2023-02-22 ENCOUNTER — Encounter (HOSPITAL_COMMUNITY): Payer: Self-pay

## 2023-02-22 ENCOUNTER — Encounter (HOSPITAL_COMMUNITY): Payer: Self-pay | Admitting: Medical

## 2023-02-22 NOTE — Anesthesia Preprocedure Evaluation (Signed)
Anesthesia Evaluation    Airway        Dental   Pulmonary           Cardiovascular hypertension,      Neuro/Psych    GI/Hepatic   Endo/Other    Renal/GU      Musculoskeletal   Abdominal   Peds  Hematology   Anesthesia Other Findings   Reproductive/Obstetrics                              Anesthesia Physical Anesthesia Plan  ASA:   Anesthesia Plan:    Post-op Pain Management:    Induction:   PONV Risk Score and Plan:   Airway Management Planned:   Additional Equipment:   Intra-op Plan:   Post-operative Plan:   Informed Consent:   Plan Discussed with:   Anesthesia Plan Comments: (See PAT note from 11/13 by Sherlie Ban PA-C )         Anesthesia Quick Evaluation

## 2023-02-22 NOTE — Progress Notes (Signed)
Case: [id number] Date/Time: 02/27/23 0715   Procedure: TRANSURETHRAL WATERJET ABLATION OF PROSTATE   Anesthesia type: General   Pre-op diagnosis: BENIGN PROSTATE HYPERPLASIA   Location: [facility]   Surgeons: Jerilee Field, MD       DISCUSSION: Ruben [name] is a 72 yo male who presents to PAT prior to surgery above. PMH of HTN, aortic atherosclerosis, severe OSA (uses CPAP), ETOH abuse, GERD, hiatal hernia, BPH c/b urinary retention and AKI requiring foley in the past, anxiety, depression.  Patient has had multiple ED visits for ETOH intoxication. Has also had a couple admissions. Most recent admission was from 02/07/23-02/10/23 where there was c/f mild withdrawal. Unclear if he's ever had DT/seizures from withdrawals.  Patient follows with Cardiology for hx of aortic atherosclerosis and DOE. Last seen on 05/11/22. He underwent a stress test and echo. Echo showed normal EF with no significant valvular disease. Stress test did not show ischemia but was intermediate risk. Due to ongoing symptoms he had a cardiac cath on 05/16/22 which showed no CAD.   Patient follows with Pulmonology for OSA and DOE. Last seen on 01/25/23. Not consistently using CPAP which also showed significant air leak. SOB/DOE felt to be due to deconditioning with element of restriction due to body habitus. Advised fu in 6 months.  VS: BP 111/76   Pulse 70   Temp 36.7 C (Oral)   Ht 5\' 11"  (1.803 m)   Wt 102.5 kg   SpO2 96%   BMI 31.52 kg/m   PROVIDERS: Soundra Pilon, FNP Cardiology: Truett Mainland, MD Pulmonology: Durel Salts, MD  LABS: Labs reviewed: Acceptable for surgery. Chronic leukpenia (all labs ordered are listed, but only abnormal results are displayed)  Labs Reviewed - No data to display   IMAGES: CT Chest/Abd/Pelvis 12/17/22:  IMPRESSION: 1. Distended urinary bladder (545 mL) and prostatomegaly with chronic lobular prostate encroachment on the base of the bladder. Query  urinary retention and/or bladder outlet obstruction.   2. Atelectasis. No other acute or inflammatory process identified in the noncontrast chest, abdomen, or pelvis.   3. Chronic gastric hiatal hernia, hepatic steatosis, Aortic Atherosclerosis (ICD10-I70.0).  EKG 02/02/23  NSR, rate 83  CV:  Left heart cath 05/16/22:  Angiographically normal coronary arteries No significant coronary artery disease Normal LVEDP Normal LVEF   Consider alternate caused for exertional dyspnea  Mobile cardiac telemetry 7 days 04/13/2022 - 04/20/2022: Dominant rhythm: Sinus. HR 47-121 bpm. Avg HR 66 bpm, in sinus rhythm. 5 episodes of probable atrial tachycardia, fastest at 171 bpm for 5 beats, longest for 13.8 secs at 137 bpm. <1% isolated SVE, couplets. 0 episodes of VT. <1% isolated VE, no couplet/triplets. No atrial fibrillation/atrial flutter/VT/high grade AV block, sinus pause >3sec noted. 0 patient triggered events.   Echo 04/27/22:  Study Quality: Poor  Normal LV systolic function with visual EF 60-65%. Left ventricle cavity is normal in size. Mild concentric hypertrophy of the left ventricle.  Normal global wall motion. Normal diastolic filling pattern, normal LAP.  No significant valvular heart disease.  Compared to 08/26/2020 G1DD is now normal otherwise no significant change.   Exercise nuclear stress test 04/25/2022: Myocardial perfusion reveals mild diaphragmatic attenuation in the inferior wall. No ischemia.  Overall LV systolic function is moderately abnormal with global hypokinesis. Stress LV EF: 39%. The LV cavity being small in rest and stress could be contributing to error in calculation.  Assess LVEF with echocardiogram.   Normal ECG stress. The patient exercised for 2  minutes and 36 seconds of a Bruce protocol, achieving approximately 4.64 METs & 95% MPHR. Stress terminated due to fatigue and THR achieved. Markedly reduced exercise tolerance. The blood pressure response was  normal. No previous exam available for comparison. Intermediate risk study due to reduced LVEF.   Past Medical History:  Diagnosis Date   Alcoholism (HCC)    Anxiety    Aortic atherosclerosis (HCC) 12/26/2016   Noted on CT   Arthritis    BPH (benign prostatic hyperplasia)    Chronic constipation    takes magnesium   Depression    Duodenal ulcer    Dyspnea 12/17/2017   ED (erectile dysfunction)    GERD (gastroesophageal reflux disease)    Heart palpitations    Occ   Hiatal hernia    History of adenomatous polyp of colon    tubular adenoma's 2005   History of Helicobacter pylori infection    2008   Hypercholesteremia    Hypertension    MR (mitral regurgitation)    Mild, noted on ECHO   Palpitations    Pre-diabetes    Pulmonary nodule 12/26/2016   noted on CT, 5 x 3 mm nodular opacity right upper lobe   Rosacea    Schatzki's ring    last dilated 02/ 2016   Urinary retention    history of   Varicocele 02/25/2014   Small to moderate left varicocele    Past Surgical History:  Procedure Laterality Date   BALLOON DILATION N/A 06/08/2014   Procedure: BALLOON DILATION;  Surgeon: Charolett Bumpers, MD;  [facility];  Service: Endoscopy;  Laterality: N/A;   COLONOSCOPY WITH PROPOFOL N/A 06/08/2014   Procedure: COLONOSCOPY WITH PROPOFOL;  Surgeon: Charolett Bumpers, MD;  [facility];  Service: Endoscopy;  Laterality: N/A;   ESOPHAGOGASTRODUODENOSCOPY N/A 06/08/2014   Procedure: ESOPHAGOGASTRODUODENOSCOPY (EGD);  Surgeon: Charolett Bumpers, MD;  Location: [facility];  Service: Endoscopy;  Laterality: N/A;   ESOPHAGOGASTRODUODENOSCOPY (EGD) WITH ESOPHAGEAL DILATION N/A 12/31/2012   Procedure: ESOPHAGOGASTRODUODENOSCOPY (EGD) WITH ESOPHAGEAL DILATION;  Surgeon: Charolett Bumpers, MD;  [facility];  Service: Endoscopy;  Laterality: N/A;   GREEN LIGHT LASER TURP (TRANSURETHRAL RESECTION OF PROSTATE N/A 12/28/2015   Procedure: GREEN LIGHT LASER TURP  (TRANSURETHRAL RESECTION OF PROSTATE;  Surgeon: Jerilee Field, MD;  Location: [facility];  Service: Urology;  Laterality: N/A;   HERNIA REPAIR  04/2018   hiatal   LEFT HEART CATH AND CORONARY ANGIOGRAPHY N/A 05/16/2022   Procedure: LEFT HEART CATH AND CORONARY ANGIOGRAPHY;  Surgeon: Elder Negus, MD;  [facility];  Service: Cardiovascular;  Laterality: N/A;    MEDICATIONS:  cloNIDine (CATAPRES) 0.1 MG tablet   finasteride (PROSCAR) 5 MG tablet   folic acid (FOLVITE) 1 MG tablet   gabapentin (NEURONTIN) 300 MG capsule   magnesium hydroxide (MILK OF MAGNESIA) 400 MG/5ML suspension   metoprolol tartrate (LOPRESSOR) 50 MG tablet   Multiple Vitamin (MULTIVITAMIN WITH MINERALS) TABS tablet   QUEtiapine (SEROQUEL) 300 MG tablet   rosuvastatin (CRESTOR) 20 MG tablet   senna-docusate (SENOKOT-S) 8.6-50 MG tablet   tamsulosin (FLOMAX) 0.4 MG CAPS capsule   thiamine (VITAMIN B-1) 100 MG tablet   No current facility-administered medications for this encounter.   Marcille Blanco [facility] Short Stay/Anesthesiology [facility] Phone [phone] 02/22/2023 1:40 PM

## 2023-02-26 ENCOUNTER — Inpatient Hospital Stay (HOSPITAL_COMMUNITY): Payer: Medicare HMO

## 2023-02-26 ENCOUNTER — Inpatient Hospital Stay (HOSPITAL_COMMUNITY)
Admission: EM | Admit: 2023-02-26 | Discharge: 2023-03-01 | DRG: 896 | Disposition: A | Discharge status: Home / Self Care | Payer: Medicare HMO | Attending: Internal Medicine | Admitting: Internal Medicine

## 2023-02-26 ENCOUNTER — Other Ambulatory Visit: Payer: Self-pay

## 2023-02-26 ENCOUNTER — Emergency Department (HOSPITAL_COMMUNITY): Payer: Medicare HMO

## 2023-02-26 ENCOUNTER — Encounter (HOSPITAL_COMMUNITY): Payer: Self-pay

## 2023-02-26 DIAGNOSIS — F10931 Alcohol use, unspecified with withdrawal delirium: Secondary | ICD-10-CM | POA: Diagnosis not present

## 2023-02-26 DIAGNOSIS — Z8249 Family history of ischemic heart disease and other diseases of the circulatory system: Secondary | ICD-10-CM

## 2023-02-26 DIAGNOSIS — E669 Obesity, unspecified: Secondary | ICD-10-CM | POA: Diagnosis present

## 2023-02-26 DIAGNOSIS — F32A Depression, unspecified: Secondary | ICD-10-CM | POA: Diagnosis present

## 2023-02-26 DIAGNOSIS — Z8619 Personal history of other infectious and parasitic diseases: Secondary | ICD-10-CM

## 2023-02-26 DIAGNOSIS — R7989 Other specified abnormal findings of blood chemistry: Secondary | ICD-10-CM | POA: Diagnosis present

## 2023-02-26 DIAGNOSIS — I10 Essential (primary) hypertension: Secondary | ICD-10-CM | POA: Diagnosis present

## 2023-02-26 DIAGNOSIS — Z79899 Other long term (current) drug therapy: Secondary | ICD-10-CM

## 2023-02-26 DIAGNOSIS — Z6831 Body mass index (BMI) 31.0-31.9, adult: Secondary | ICD-10-CM

## 2023-02-26 DIAGNOSIS — Z1152 Encounter for screening for COVID-19: Secondary | ICD-10-CM

## 2023-02-26 DIAGNOSIS — D6959 Other secondary thrombocytopenia: Secondary | ICD-10-CM | POA: Diagnosis not present

## 2023-02-26 DIAGNOSIS — F419 Anxiety disorder, unspecified: Secondary | ICD-10-CM | POA: Diagnosis present

## 2023-02-26 DIAGNOSIS — E7800 Pure hypercholesterolemia, unspecified: Secondary | ICD-10-CM | POA: Diagnosis present

## 2023-02-26 DIAGNOSIS — R6511 Systemic inflammatory response syndrome (SIRS) of non-infectious origin with acute organ dysfunction: Secondary | ICD-10-CM | POA: Diagnosis present

## 2023-02-26 DIAGNOSIS — F10231 Alcohol dependence with withdrawal delirium: Principal | ICD-10-CM | POA: Diagnosis present

## 2023-02-26 DIAGNOSIS — I700 Atherosclerosis of aorta: Secondary | ICD-10-CM | POA: Diagnosis not present

## 2023-02-26 DIAGNOSIS — Z8711 Personal history of peptic ulcer disease: Secondary | ICD-10-CM

## 2023-02-26 DIAGNOSIS — R112 Nausea with vomiting, unspecified: Secondary | ICD-10-CM | POA: Diagnosis present

## 2023-02-26 DIAGNOSIS — F10939 Alcohol use, unspecified with withdrawal, unspecified: Secondary | ICD-10-CM | POA: Diagnosis not present

## 2023-02-26 DIAGNOSIS — K5909 Other constipation: Secondary | ICD-10-CM | POA: Diagnosis present

## 2023-02-26 DIAGNOSIS — N529 Male erectile dysfunction, unspecified: Secondary | ICD-10-CM | POA: Diagnosis present

## 2023-02-26 DIAGNOSIS — M1990 Unspecified osteoarthritis, unspecified site: Secondary | ICD-10-CM | POA: Diagnosis present

## 2023-02-26 DIAGNOSIS — R7303 Prediabetes: Secondary | ICD-10-CM | POA: Diagnosis present

## 2023-02-26 DIAGNOSIS — K219 Gastro-esophageal reflux disease without esophagitis: Secondary | ICD-10-CM | POA: Diagnosis present

## 2023-02-26 DIAGNOSIS — T510X1A Toxic effect of ethanol, accidental (unintentional), initial encounter: Secondary | ICD-10-CM | POA: Diagnosis present

## 2023-02-26 DIAGNOSIS — Z860101 Personal history of adenomatous and serrated colon polyps: Secondary | ICD-10-CM

## 2023-02-26 DIAGNOSIS — E66811 Obesity, class 1: Secondary | ICD-10-CM | POA: Diagnosis present

## 2023-02-26 DIAGNOSIS — N179 Acute kidney failure, unspecified: Secondary | ICD-10-CM | POA: Diagnosis present

## 2023-02-26 DIAGNOSIS — R000 Tachycardia, unspecified: Secondary | ICD-10-CM | POA: Diagnosis not present

## 2023-02-26 DIAGNOSIS — I340 Nonrheumatic mitral (valve) insufficiency: Secondary | ICD-10-CM | POA: Diagnosis not present

## 2023-02-26 DIAGNOSIS — E872 Acidosis, unspecified: Secondary | ICD-10-CM | POA: Diagnosis not present

## 2023-02-26 DIAGNOSIS — R110 Nausea: Secondary | ICD-10-CM | POA: Diagnosis not present

## 2023-02-26 DIAGNOSIS — Z9079 Acquired absence of other genital organ(s): Secondary | ICD-10-CM

## 2023-02-26 DIAGNOSIS — K5900 Constipation, unspecified: Secondary | ICD-10-CM | POA: Diagnosis not present

## 2023-02-26 DIAGNOSIS — I959 Hypotension, unspecified: Secondary | ICD-10-CM | POA: Diagnosis present

## 2023-02-26 DIAGNOSIS — E785 Hyperlipidemia, unspecified: Secondary | ICD-10-CM | POA: Diagnosis present

## 2023-02-26 DIAGNOSIS — N400 Enlarged prostate without lower urinary tract symptoms: Secondary | ICD-10-CM | POA: Diagnosis present

## 2023-02-26 DIAGNOSIS — E876 Hypokalemia: Secondary | ICD-10-CM | POA: Diagnosis present

## 2023-02-26 DIAGNOSIS — L719 Rosacea, unspecified: Secondary | ICD-10-CM | POA: Diagnosis present

## 2023-02-26 DIAGNOSIS — R918 Other nonspecific abnormal finding of lung field: Secondary | ICD-10-CM | POA: Diagnosis not present

## 2023-02-26 DIAGNOSIS — F10239 Alcohol dependence with withdrawal, unspecified: Secondary | ICD-10-CM | POA: Diagnosis not present

## 2023-02-26 DIAGNOSIS — E871 Hypo-osmolality and hyponatremia: Secondary | ICD-10-CM | POA: Diagnosis not present

## 2023-02-26 DIAGNOSIS — R059 Cough, unspecified: Secondary | ICD-10-CM | POA: Diagnosis not present

## 2023-02-26 LAB — LIPASE, BLOOD: Lipase: 27 U/L (ref 11–51)

## 2023-02-26 LAB — COMPREHENSIVE METABOLIC PANEL
ALT: 64 U/L — ABNORMAL HIGH (ref 0–44)
AST: 60 U/L — ABNORMAL HIGH (ref 15–41)
Albumin: 4.4 g/dL (ref 3.5–5.0)
Alkaline Phosphatase: 76 U/L (ref 38–126)
Anion gap: >20 — ABNORMAL HIGH (ref 5–15)
BUN: 15 mg/dL (ref 8–23)
CO2: 9 mmol/L — ABNORMAL LOW (ref 22–32)
Calcium: 8.9 mg/dL (ref 8.9–10.3)
Chloride: 99 mmol/L (ref 98–111)
Creatinine, Ser: 1.43 mg/dL — ABNORMAL HIGH (ref 0.61–1.24)
GFR, Estimated: 52 mL/min — ABNORMAL LOW (ref >60)
Glucose, Bld: 93 mg/dL (ref 70–99)
Potassium: 4.8 mmol/L (ref 3.5–5.1)
Sodium: 138 mmol/L (ref 135–145)
Total Bilirubin: 1.1 mg/dL (ref <1.2)
Total Protein: 8.3 g/dL — ABNORMAL HIGH (ref 6.5–8.1)

## 2023-02-26 LAB — BLOOD GAS, VENOUS
Acid-base deficit: 19.4 mmol/L — ABNORMAL HIGH (ref 0.0–2.0)
Bicarbonate: 8.5 mmol/L — ABNORMAL LOW (ref 20.0–28.0)
O2 Saturation: 95 %
Patient temperature: 37
pCO2, Ven: 26 mm[Hg] — ABNORMAL LOW (ref 44–60)
pH, Ven: 7.12 — CL (ref 7.25–7.43)
pO2, Ven: 75 mm[Hg] — ABNORMAL HIGH (ref 32–45)

## 2023-02-26 LAB — CBC
HCT: 56.9 % — ABNORMAL HIGH (ref 39.0–52.0)
Hemoglobin: 17.2 g/dL — ABNORMAL HIGH (ref 13.0–17.0)
MCH: 29.6 pg (ref 26.0–34.0)
MCHC: 30.2 g/dL (ref 30.0–36.0)
MCV: 97.8 fL (ref 80.0–100.0)
Platelets: 190 10*3/uL (ref 150–400)
RBC: 5.82 MIL/uL — ABNORMAL HIGH (ref 4.22–5.81)
RDW: 18.8 % — ABNORMAL HIGH (ref 11.5–15.5)
WBC: 14.8 10*3/uL — ABNORMAL HIGH (ref 4.0–10.5)
nRBC: 0.1 % (ref 0.0–0.2)

## 2023-02-26 LAB — LACTIC ACID, PLASMA: Lactic Acid, Venous: 8 mmol/L (ref 0.5–1.9)

## 2023-02-26 LAB — MRSA NEXT GEN BY PCR, NASAL: MRSA by PCR Next Gen: NOT DETECTED

## 2023-02-26 LAB — RESP PANEL BY RT-PCR (RSV, FLU A&B, COVID)  RVPGX2
Influenza A by PCR: NEGATIVE
Influenza B by PCR: NEGATIVE
Resp Syncytial Virus by PCR: NEGATIVE
SARS Coronavirus 2 by RT PCR: NEGATIVE

## 2023-02-26 LAB — I-STAT CG4 LACTIC ACID, ED: Lactic Acid, Venous: 11.4 mmol/L (ref 0.5–1.9)

## 2023-02-26 LAB — PROTIME-INR
INR: 1.1 (ref 0.8–1.2)
Prothrombin Time: 14.6 s (ref 11.4–15.2)

## 2023-02-26 LAB — MAGNESIUM: Magnesium: 2.2 mg/dL (ref 1.7–2.4)

## 2023-02-26 LAB — ETHANOL: Alcohol, Ethyl (B): 57 mg/dL — ABNORMAL HIGH (ref <10)

## 2023-02-26 LAB — AMMONIA: Ammonia: 29 umol/L (ref 9–35)

## 2023-02-26 MED ORDER — LACTATED RINGERS IV SOLN: INTRAVENOUS | Status: AC

## 2023-02-26 MED ORDER — SODIUM CHLORIDE 0.9 % IV BOLUS
1000.0000 mL | Freq: Once | INTRAVENOUS | Status: AC
Start: 2023-02-26 — End: 2023-02-26
  Administered 2023-02-26: 1000 mL via INTRAVENOUS

## 2023-02-26 MED ORDER — THIAMINE MONONITRATE 100 MG PO TABS
100.0000 mg | ORAL_TABLET | Freq: Every day | ORAL | Status: DC
  Administered 2023-02-27 – 2023-02-28 (×2): 100 mg via ORAL
  Filled 2023-02-26 (×2): qty 1

## 2023-02-26 MED ORDER — BISACODYL 10 MG RE SUPP
10.0000 mg | Freq: Every day | RECTAL | Status: DC
  Administered 2023-02-27 – 2023-03-01 (×2): 10 mg via RECTAL
  Filled 2023-02-26 (×2): qty 1

## 2023-02-26 MED ORDER — PHENOBARBITAL SODIUM 130 MG/ML IJ SOLN
65.0000 mg | Freq: Three times a day (TID) | INTRAMUSCULAR | Status: DC
  Filled 2023-02-26: qty 0.5

## 2023-02-26 MED ORDER — LORAZEPAM 2 MG/ML IJ SOLN
2.0000 mg | Freq: Once | INTRAMUSCULAR | Status: AC
  Administered 2023-02-26: 2 mg via INTRAVENOUS
  Filled 2023-02-26: qty 1

## 2023-02-26 MED ORDER — ONDANSETRON HCL 4 MG/2ML IJ SOLN
4.0000 mg | Freq: Once | INTRAMUSCULAR | Status: AC
  Administered 2023-02-26: 4 mg via INTRAVENOUS
  Filled 2023-02-26: qty 2

## 2023-02-26 MED ORDER — SODIUM CHLORIDE 0.9 % IV SOLN: INTRAVENOUS | Status: AC | PRN

## 2023-02-26 MED ORDER — FOLIC ACID 1 MG PO TABS
1.0000 mg | ORAL_TABLET | Freq: Every day | ORAL | Status: DC
  Administered 2023-02-27 – 2023-02-28 (×2): 1 mg via ORAL
  Filled 2023-02-26 (×2): qty 1

## 2023-02-26 MED ORDER — THIAMINE HCL 100 MG/ML IJ SOLN
100.0000 mg | Freq: Every day | INTRAMUSCULAR | Status: DC
  Filled 2023-02-26: qty 2

## 2023-02-26 MED ORDER — LORAZEPAM 1 MG PO TABS
1.0000 mg | ORAL_TABLET | ORAL | Status: DC | PRN
  Administered 2023-02-27 – 2023-02-28 (×3): 1 mg via ORAL
  Filled 2023-02-26: qty 2
  Filled 2023-02-26 (×2): qty 1

## 2023-02-26 MED ORDER — THIAMINE HCL 100 MG/ML IJ SOLN
100.0000 mg | Freq: Once | INTRAMUSCULAR | Status: AC
  Administered 2023-02-26: 100 mg via INTRAVENOUS
  Filled 2023-02-26: qty 2

## 2023-02-26 MED ORDER — PHENOBARBITAL SODIUM 130 MG/ML IJ SOLN: 65.0000 mg | Freq: Every day | INTRAMUSCULAR | Status: DC

## 2023-02-26 MED ORDER — LORAZEPAM 2 MG/ML IJ SOLN
1.0000 mg | INTRAMUSCULAR | Status: DC | PRN
  Administered 2023-02-26: 1 mg via INTRAVENOUS
  Administered 2023-02-26: 4 mg via INTRAVENOUS
  Filled 2023-02-26: qty 2
  Filled 2023-02-26: qty 1

## 2023-02-26 MED ORDER — SODIUM CHLORIDE 0.9 % IV SOLN
260.0000 mg | Freq: Once | INTRAVENOUS | Status: AC
  Administered 2023-02-26: 260 mg via INTRAVENOUS
  Filled 2023-02-26: qty 2

## 2023-02-26 MED ORDER — ENOXAPARIN SODIUM 40 MG/0.4ML IJ SOSY
40.0000 mg | PREFILLED_SYRINGE | INTRAMUSCULAR | Status: DC
  Administered 2023-02-26: 40 mg via SUBCUTANEOUS
  Filled 2023-02-26 (×2): qty 0.4

## 2023-02-26 MED ORDER — ADULT MULTIVITAMIN W/MINERALS CH
1.0000 | ORAL_TABLET | Freq: Every day | ORAL | Status: DC
  Administered 2023-02-27 – 2023-02-28 (×2): 1 via ORAL
  Filled 2023-02-26 (×2): qty 1

## 2023-02-26 MED ORDER — SODIUM BICARBONATE 8.4 % IV SOLN
INTRAVENOUS | Status: AC
  Filled 2023-02-26: qty 150

## 2023-02-26 MED ORDER — LORAZEPAM 2 MG/ML IJ SOLN
1.0000 mg | Freq: Once | INTRAMUSCULAR | Status: DC
  Filled 2023-02-26: qty 1

## 2023-02-26 MED ORDER — LABETALOL HCL 5 MG/ML IV SOLN
10.0000 mg | INTRAVENOUS | Status: DC | PRN
  Filled 2023-02-26: qty 4

## 2023-02-26 MED ORDER — CLONIDINE HCL 0.1 MG/24HR TD PTWK
0.1000 mg | MEDICATED_PATCH | TRANSDERMAL | Status: DC
  Administered 2023-02-26: 0.1 mg via TRANSDERMAL
  Filled 2023-02-26: qty 1

## 2023-02-26 MED ORDER — PHENOBARBITAL SODIUM 130 MG/ML IJ SOLN
130.0000 mg | Freq: Three times a day (TID) | INTRAMUSCULAR | Status: AC
  Administered 2023-02-26 – 2023-02-27 (×3): 130 mg via INTRAVENOUS
  Filled 2023-02-26 (×3): qty 1

## 2023-02-26 MED ORDER — CHLORHEXIDINE GLUCONATE CLOTH 2 % EX PADS
6.0000 | MEDICATED_PAD | Freq: Every day | CUTANEOUS | Status: DC
  Administered 2023-02-26 – 2023-02-28 (×3): 6 via TOPICAL

## 2023-02-26 MED ORDER — LORAZEPAM 2 MG/ML IJ SOLN
2.0000 mg | Freq: Once | INTRAMUSCULAR | Status: AC
  Administered 2023-02-26: 1 mg via INTRAVENOUS

## 2023-02-26 MED ORDER — METOPROLOL TARTRATE 5 MG/5ML IV SOLN
5.0000 mg | Freq: Three times a day (TID) | INTRAVENOUS | Status: DC
  Administered 2023-02-26 – 2023-02-27 (×3): 5 mg via INTRAVENOUS
  Filled 2023-02-26 (×3): qty 5

## 2023-02-26 NOTE — H&P (Addendum)
NAME:  Ruben [name], MRN:  [id number], DOB:  04/22/1950, LOS: 0 ADMISSION DATE:  02/26/2023, CONSULTATION DATE:  02/26/23 REFERRING MD:  EDP, CHIEF COMPLAINT:  N/V   History of Present Illness:  72 year old man with past medical history significant for extensive alcohol use, GERD, mitral regurgitation, HLD, and anxiety who presented to the ED at [facility] 11/18 with complaints of nausea, tachycardia, and cough.  Patient states this is in the setting of recent self discontinuation of alcohol use.  Reports he drinks upwards of 1/5 of liquor a day with last drink 11/16.  Patient does report withdrawal seizures in the past.  On ICU assessment patient is seen tachycardic, tachypneic, hypertensive, and tremorous.  Significant lab work on admission included CO2 9, creatinine 1.43, anion gap greater than 20, AST 60, ALT 64, total protein 8.3, lactic 11.4, WBC 14.8, hemoglobin 17.2.   Pertinent  Medical History  Alcohol use, GERD, mitral regurgitation, HLD, and anxiety   Significant Hospital Events: Including procedures, antibiotic start and stop dates in addition to other pertinent events   11/18 admitted with acute alcohol withdrawal, phenobarb taper started  Interim History / Subjective:  As above  Objective   Blood pressure (!) 170/91, pulse (!) 149, temperature 98.6 F (37 C), temperature source Oral, resp. rate 19, height 5\' 11"  (1.803 m), weight 102 kg, SpO2 97%.       No intake or output data in the 24 hours ending 02/26/23 1026 Filed Weights   02/26/23 0738  Weight: 102 kg    Examination: General: Acute on chronically ill appearing elderly male lying in bed, in NAD HEENT: [place]/AT, MM pink/moist, PERRL,  Neuro: Alert and oriented x3, non-focal  CV: s1s2 regular rate and rhythm, no murmur, rubs, or gallops,  PULM:  Clear to auscultation, no increased work of breathing, no added breath sounds  GI: soft, bowel sounds active in all 4 quadrants, non-tender, non-distended, Extremities:  warm/dry, no edema  Skin: no rashes or lesions  Resolved Hospital Problem list     Assessment & Plan:  Acute alcohol withdrawal with delirium tremens, history of withdrawal seizure -Ethanol level this admission pending but most recent positive result October 2024 as high as 317 Elevated LFTs P: Phenobarbital taper Frequent CIWA checks Seizure precautions IV hydration Supplement thiamine, folate, multivitamin  Lactic acidosis Open anion gap metabolic acidosis P: Bicarb drip IV resuscitation Trend lactic acid  Acute Kidney Injury  -Creatinine 1.43 with GFR 52, renal function normal 02/10/23  P: Follow renal function  Monitor urine output Trend Bmet Avoid nephrotoxins Ensure adequate renal perfusion  IV hydration   Essential HTN HLD P: Resume home medications when able    Best Practice (right click and "Reselect all SmartList Selections" daily)   Diet/type: NPO DVT prophylaxis: SCD Pressure ulcer(s). No pressure injury stage present on admission GI prophylaxis: PPI Lines: N/A Foley:  N/A Code Status:  full code Last date of multidisciplinary goals of care discussion: Pending   Labs   CBC: Recent Labs  Lab 02/26/23 0750  WBC 14.8*  HGB 17.2*  HCT 56.9*  MCV 97.8  PLT 190    Basic Metabolic Panel: Recent Labs  Lab 02/26/23 0750  NA 138  K 4.8  CL 99  CO2 9*  GLUCOSE 93  BUN 15  CREATININE 1.43*  CALCIUM 8.9  MG 2.2   GFR: Estimated Creatinine Clearance: 57.6 mL/min (A) (by C-G formula based on SCr of 1.43 mg/dL (H)). Recent Labs  Lab 02/26/23  0750 02/26/23 0934  WBC 14.8*  --   LATICACIDVEN  --  11.4*    Liver Function Tests: Recent Labs  Lab 02/26/23 0750  AST 60*  ALT 64*  ALKPHOS 76  BILITOT 1.1  PROT 8.3*  ALBUMIN 4.4   Recent Labs  Lab 02/26/23 0750  LIPASE 27   No results for input(s): "AMMONIA" in the last 168 hours.  ABG    Component Value Date/Time   PHART 7.218 (L) 06/23/2020 0005   PCO2ART 24.5 (L)  06/23/2020 0005   PO2ART 83.8 06/23/2020 0005   HCO3 19.1 (L) 02/07/2023 1332   TCO2 20 (L) 02/07/2023 1332   ACIDBASEDEF 5.0 (H) 02/07/2023 1332   O2SAT 99 02/07/2023 1332     Coagulation Profile: No results for input(s): "INR", "PROTIME" in the last 168 hours.  Cardiac Enzymes: No results for input(s): "CKTOTAL", "CKMB", "CKMBINDEX", "TROPONINI" in the last 168 hours.  HbA1C: Hgb A1c MFr Bld  Date/Time Value Ref Range Status  02/08/2023 12:28 PM 6.0 (H) 4.8 - 5.6 % Final    Comment:    (NOTE) Pre diabetes:          5.7%-6.4%  Diabetes:              >6.4%  Glycemic control for   <7.0% adults with diabetes   12/26/2019 05:01 AM 5.3 4.8 - 5.6 % Final    Comment:    (NOTE) Pre diabetes:          5.7%-6.4%  Diabetes:              >6.4%  Glycemic control for   <7.0% adults with diabetes     CBG: No results for input(s): "GLUCAP" in the last 168 hours.  Review of Systems:   Please see the history of present illness. All other systems reviewed and are negative   Past Medical History:  He,  has a past medical history of Alcoholism (HCC), Anxiety, Aortic atherosclerosis (HCC) (12/26/2016), Arthritis, BPH (benign prostatic hyperplasia), Chronic constipation, Depression, Duodenal ulcer, Dyspnea (12/17/2017), ED (erectile dysfunction), GERD (gastroesophageal reflux disease), Heart palpitations, Hiatal hernia, History of adenomatous polyp of colon, History of Helicobacter pylori infection, Hypercholesteremia, Hypertension, MR (mitral regurgitation), Palpitations, Pre-diabetes, Pulmonary nodule (12/26/2016), Rosacea, Schatzki's ring, Urinary retention, and Varicocele (02/25/2014).   Surgical History:   Past Surgical History:  Procedure Laterality Date   BALLOON DILATION N/A 06/08/2014   Procedure: BALLOON DILATION;  Surgeon: Charolett Bumpers, MD;  [facility];  Service: Endoscopy;  Laterality: N/A;   COLONOSCOPY WITH PROPOFOL N/A 06/08/2014   Procedure:  COLONOSCOPY WITH PROPOFOL;  Surgeon: Charolett Bumpers, MD;  [facility];  Service: Endoscopy;  Laterality: N/A;   ESOPHAGOGASTRODUODENOSCOPY N/A 06/08/2014   Procedure: ESOPHAGOGASTRODUODENOSCOPY (EGD);  Surgeon: Charolett Bumpers, MD;  Location: [facility];  Service: Endoscopy;  Laterality: N/A;   ESOPHAGOGASTRODUODENOSCOPY (EGD) WITH ESOPHAGEAL DILATION N/A 12/31/2012   Procedure: ESOPHAGOGASTRODUODENOSCOPY (EGD) WITH ESOPHAGEAL DILATION;  Surgeon: Charolett Bumpers, MD;  [facility];  Service: Endoscopy;  Laterality: N/A;   GREEN LIGHT LASER TURP (TRANSURETHRAL RESECTION OF PROSTATE N/A 12/28/2015   Procedure: GREEN LIGHT LASER TURP (TRANSURETHRAL RESECTION OF PROSTATE;  Surgeon: Jerilee Field, MD;  Location: [facility];  Service: Urology;  Laterality: N/A;   HERNIA REPAIR  04/2018   hiatal   LEFT HEART CATH AND CORONARY ANGIOGRAPHY N/A 05/16/2022   Procedure: LEFT HEART CATH AND CORONARY ANGIOGRAPHY;  Surgeon: Elder Negus, MD;  [facility];  Service: Cardiovascular;  Laterality: N/A;     Social History:   reports that he has never smoked. He has never used smokeless tobacco. He reports current alcohol use of about 154.0 standard drinks of alcohol per week. He reports that he does not currently use drugs after having used the following drugs: Marijuana.   Family History:  His family history includes Breast cancer in his mother; Heart failure in his mother; Other in his father; Stroke in his father. There is no history of Lung disease.   Allergies No Known Allergies   Home Medications  Prior to Admission medications   Medication Sig Start Date End Date Taking? Authorizing Provider  cloNIDine (CATAPRES) 0.1 MG tablet Take 1 tablet (0.1 mg total) by mouth 2 (two) times daily. 02/04/23   Almon Hercules, MD  finasteride (PROSCAR) 5 MG tablet Take 5 mg by mouth daily.    [provider]  folic acid (FOLVITE) 1 MG  tablet Take 1 tablet (1 mg total) by mouth daily. Patient not taking: Reported on 02/07/2023 02/05/23   Almon Hercules, MD  gabapentin (NEURONTIN) 300 MG capsule Take 300 mg by mouth at bedtime.    [provider]  magnesium hydroxide (MILK OF MAGNESIA) 400 MG/5ML suspension Take 30 mLs by mouth every other day.    [provider]  metoprolol tartrate (LOPRESSOR) 50 MG tablet Take 50 mg by mouth 2 (two) times daily.    [provider]  Multiple Vitamin (MULTIVITAMIN WITH MINERALS) TABS tablet Take 1 tablet by mouth daily. Patient not taking: Reported on 02/07/2023 02/05/23   Almon Hercules, MD  QUEtiapine (SEROQUEL) 300 MG tablet Take 300 mg by mouth at bedtime. 12/02/22   [provider]  rosuvastatin (CRESTOR) 20 MG tablet Take 20 mg by mouth every Monday, Wednesday, and Friday. 08/23/21   [provider]  senna-docusate (SENOKOT-S) 8.6-50 MG tablet Take 1 tablet by mouth daily as needed for mild constipation.    [provider]  tamsulosin (FLOMAX) 0.4 MG CAPS capsule Take 0.4 mg by mouth at bedtime.    [provider]  thiamine (VITAMIN B-1) 100 MG tablet Take 1 tablet (100 mg total) by mouth daily. Patient not taking: Reported on 02/07/2023 02/05/23   Almon Hercules, MD     Critical care time:   CRITICAL CARE Performed by: Pavan Bring D. Harris   Total critical care time: 40 minutes  Critical care time was exclusive of separately billable procedures and treating other patients.  Critical care was necessary to treat or prevent imminent or life-threatening deterioration.  Critical care was time spent personally by me on the following activities: development of treatment plan with patient and/or surrogate as well as nursing, discussions with consultants, evaluation of patient's response to treatment, examination of patient, obtaining history from patient or surrogate, ordering and performing treatments and interventions, ordering and  review of laboratory studies, ordering and review of radiographic studies, pulse oximetry and re-evaluation of patient's condition.  Grizel Vesely D. Harris, NP-C [facility] Personal contact information can be found on Amion  If no contact or response made please call [phone] 02/26/2023, 2:40 PM

## 2023-02-26 NOTE — ED Triage Notes (Signed)
N/V since yesterday unable to keep anything down c/o cough that started yesterday as well.  Drinks about "700 ml" a day Saturday is the last time had alcohol states he has had withdrawal seizure in the past years ago.  Denies pain alert oriented lives alone

## 2023-02-26 NOTE — Progress Notes (Signed)
TOC consulted for substance abuse resources. Resources attached to AVS. No further TOC needs.

## 2023-02-26 NOTE — Progress Notes (Signed)
eLink Physician-Brief Progress Note Patient Name: Ruben [name] DOB: 10-Aug-1950 MRN: [id number]   Date of Service  02/26/2023  HPI/Events of Note  72 year old initially presented with alcohol withdrawal with nausea, vomiting and started on continuous crystalloid infusion.  Primary team placed order to not place male purewick.  I suspect this is due to ongoing intermittent delirium  eICU Interventions  Maintain order per primary team.     Intervention Category Minor Interventions: Routine modifications to care plan (e.g. PRN medications for pain, fever)  Ruben [name] 02/26/2023, 9:10 PM

## 2023-02-26 NOTE — ED Notes (Signed)
High risk for falls socks, fall sign in place and falls band on arm

## 2023-02-26 NOTE — ED Provider Notes (Signed)
[place] EMERGENCY DEPARTMENT AT [facility] Provider Note   CSN: [id number] Arrival date & time: 02/26/23  7829     History  No chief complaint on file.   Ruben [name] is a 72 y.o. male with past medical history significant for alcohol abuse, GERD, kidney injury, hyponatremia, hyperbilirubinemia, acute encephalopathy, mitral regurg, hyperlipidemia, anxiety who presents with concern for nausea, tachycardia, cough.  Patient reports that he drinks around 700 mL of liquor per day, reports last drink was on Saturday.  Patient reports that he has had withdrawal seizures sometime in the past years ago.  Patient reports he lives alone, he denies any pain.  He reports no blood in recent episodes of vomit.  He denies any audiovisual hallucinations, reports that he has been having problems with sleep.  Denies any chest pain or shortness of breath.  HPI     Home Medications Prior to Admission medications   Medication Sig Start Date End Date Taking? Authorizing Provider  cloNIDine (CATAPRES) 0.1 MG tablet Take 1 tablet (0.1 mg total) by mouth 2 (two) times daily. 02/04/23  Yes Almon Hercules, MD  finasteride (PROSCAR) 5 MG tablet Take 5 mg by mouth daily.   Yes [provider]  gabapentin (NEURONTIN) 300 MG capsule Take 300 mg by mouth at bedtime.   Yes [provider]  magnesium hydroxide (MILK OF MAGNESIA) 400 MG/5ML suspension Take 30 mLs by mouth every other day.   Yes [provider]  metoprolol tartrate (LOPRESSOR) 50 MG tablet Take 50 mg by mouth 2 (two) times daily.   Yes [provider]  QUEtiapine (SEROQUEL) 300 MG tablet Take 300 mg by mouth at bedtime. 12/02/22  Yes [provider]  rosuvastatin (CRESTOR) 20 MG tablet Take 20 mg by mouth every Monday, Wednesday, and Friday. 08/23/21  Yes [provider]  tamsulosin (FLOMAX) 0.4 MG CAPS capsule Take 0.4 mg by mouth at bedtime.   Yes [provider]  folic acid  (FOLVITE) 1 MG tablet Take 1 tablet (1 mg total) by mouth daily. Patient not taking: Reported on 02/07/2023 02/05/23   Almon Hercules, MD  senna-docusate (SENOKOT-S) 8.6-50 MG tablet Take 1 tablet by mouth daily as needed for mild constipation. Patient not taking: Reported on 02/26/2023    [provider]      Allergies    Patient has no known allergies.    Review of Systems   Review of Systems  All other systems reviewed and are negative.   Physical Exam Updated Vital Signs BP (!) 170/91   Pulse (!) 149   Temp 98.6 F (37 C) (Oral)   Resp 19   Ht 5\' 11"  (1.803 m)   Wt 102 kg   SpO2 97%   BMI 31.36 kg/m  Physical Exam Vitals and nursing note reviewed.  Constitutional:      General: He is not in acute distress.    Appearance: Normal appearance. He is ill-appearing.  HENT:     Head: Normocephalic and atraumatic.  Eyes:     General:        Right eye: No discharge.        Left eye: No discharge.  Cardiovascular:     Rate and Rhythm: Regular rhythm. Tachycardia present.     Heart sounds: No murmur heard.    No friction rub. No gallop.  Pulmonary:     Effort: Pulmonary effort is normal.     Breath sounds: Normal breath sounds.  Comments: Normal respiratory effort, no wheezing, scattered rhonchi, and dry cough Abdominal:     General: Bowel sounds are normal.     Palpations: Abdomen is soft.     Comments: Protuberant abdomen without significant distention or fluid wave  Skin:    General: Skin is warm and dry.     Capillary Refill: Capillary refill takes less than 2 seconds.     Comments: Warm, ruddy skin  Neurological:     Mental Status: He is alert and oriented to person, place, and time.  Psychiatric:        Mood and Affect: Mood normal.        Behavior: Behavior normal.     ED Results / Procedures / Treatments   Labs (all labs ordered are listed, but only abnormal results are displayed) Labs Reviewed  CBC - Abnormal; Notable for the following  components:      Result Value   WBC 14.8 (*)    RBC 5.82 (*)    Hemoglobin 17.2 (*)    HCT 56.9 (*)    RDW 18.8 (*)    All other components within normal limits  COMPREHENSIVE METABOLIC PANEL - Abnormal; Notable for the following components:   CO2 9 (*)    Creatinine, Ser 1.43 (*)    Total Protein 8.3 (*)    AST 60 (*)    ALT 64 (*)    GFR, Estimated 52 (*)    Anion gap >20 (*)    All other components within normal limits  I-STAT CG4 LACTIC ACID, ED - Abnormal; Notable for the following components:   Lactic Acid, Venous 11.4 (*)    All other components within normal limits  RESP PANEL BY RT-PCR (RSV, FLU A&B, COVID)  RVPGX2  LIPASE, BLOOD  MAGNESIUM  PROTIME-INR  AMMONIA  BLOOD GAS, VENOUS  LACTIC ACID, PLASMA  CBG MONITORING, ED    EKG EKG Interpretation Date/Time:  Monday February 26 2023 07:35:01 EST Ventricular Rate:  160 PR Interval:  232 QRS Duration:  95 QT Interval:  343 QTC Calculation: 560 R Axis:   102  Text Interpretation: Sinus tachycardia Right axis deviation Low voltage, extremity leads Baseline wander in lead(s) I III aVL Nonspecific tw changes rate related, since prior ECG rate has increased Confirmed by Alvira Monday ([id number]) on 02/26/2023 7:42:57 AM  Radiology DG Chest Portable 1 View  Result Date: 02/26/2023 CLINICAL DATA:  shob EXAM: PORTABLE CHEST 1 VIEW COMPARISON:  Chest x-ray 08/11/2022, CT chest 12/17/2022, chest x-ray 06/22/2020 FINDINGS: The heart and mediastinal contours are unchanged. Poorly defined left hemidiaphragm and left cardiac border stable and likely due to overlying mediastinal fatty density as seen on CT chest. No focal consolidation. No pulmonary edema. Trace left pleural effusion not excluded. No right pleural effusion. No pneumothorax. No acute osseous abnormality. IMPRESSION: Poorly defined left hemidiaphragm and left cardiac border stable and likely due to overlying mediastinal fatty density as seen on CT chest. Trace left  pleural effusion not excluded- recommend chest x-ray lateral view for further evaluation. Electronically Signed   By: Tish Frederickson M.D.   On: 02/26/2023 09:29    Procedures .Critical Care  Performed by: Olene Floss, PA-C Authorized by: Olene Floss, PA-C   Critical care provider statement:    Critical care time (minutes):  35   Critical care was necessary to treat or prevent imminent or life-threatening deterioration of the following conditions:  Circulatory failure (Acute alcohol withdrawal with severe tachycardia)   Critical care  was time spent personally by me on the following activities:  Development of treatment plan with patient or surrogate, discussions with consultants, evaluation of patient's response to treatment, examination of patient, ordering and review of laboratory studies, ordering and review of radiographic studies, ordering and performing treatments and interventions, pulse oximetry, re-evaluation of patient's condition and review of old charts   Care discussed with: admitting provider       Medications Ordered in ED Medications  sodium chloride 0.9 % bolus 1,000 mL (has no administration in time range)  LORazepam (ATIVAN) injection 2 mg (has no administration in time range)  PHENObarbital (LUMINAL) 260 mg in sodium chloride 0.9 % 100 mL IVPB (has no administration in time range)    Followed by  PHENObarbital (LUMINAL) injection 130 mg (has no administration in time range)    Followed by  PHENObarbital (LUMINAL) injection 65 mg (has no administration in time range)    Followed by  PHENObarbital (LUMINAL) injection 65 mg (has no administration in time range)  LORazepam (ATIVAN) tablet 1-4 mg (has no administration in time range)    Or  LORazepam (ATIVAN) injection 1-4 mg (has no administration in time range)  thiamine (VITAMIN B1) tablet 100 mg (has no administration in time range)    Or  thiamine (VITAMIN B1) injection 100 mg (has no  administration in time range)  folic acid (FOLVITE) tablet 1 mg (has no administration in time range)  multivitamin with minerals tablet 1 tablet (has no administration in time range)  lactated ringers infusion (has no administration in time range)  enoxaparin (LOVENOX) injection 40 mg (has no administration in time range)  bisacodyl (DULCOLAX) suppository 10 mg (has no administration in time range)  ondansetron (ZOFRAN) injection 4 mg (4 mg Intravenous Given 02/26/23 0816)  sodium chloride 0.9 % bolus 1,000 mL (1,000 mLs Intravenous Bolus 02/26/23 0830)  thiamine (VITAMIN B1) injection 100 mg (100 mg Intravenous Given 02/26/23 0832)  LORazepam (ATIVAN) injection 2 mg (1 mg Intravenous Given 02/26/23 0814)  LORazepam (ATIVAN) injection 2 mg (2 mg Intravenous Given 02/26/23 0919)    ED Course/ Medical Decision Making/ A&P Clinical Course as of 02/26/23 1058  Mon Feb 26, 2023  1005 Myrla Halsted with Critical Care will eval at bedside [CP]    Clinical Course User Index [CP] Olene Floss, PA-C                                 Medical Decision Making Amount and/or Complexity of Data Reviewed Labs: ordered. Radiology: ordered.  Risk Prescription drug management.   This patient is a 72 y.o. male who presents to the ED for concern of nausea, tachycardia, withdrawal, this involves an extensive number of treatment options, and is a complaint that carries with it a high risk of complications and morbidity. The emergent differential diagnosis prior to evaluation includes, but is not limited to, acute alcohol withdrawal, delirium tremens, sepsis, intra-abdominal infection, upper respiratory infection, tachydysrhythmia, A-fib, a flutter, SVT, versus other. This is not an exhaustive differential.   Past Medical History / Co-morbidities / Social History: alcohol abuse, GERD, kidney injury, hyponatremia, hyperbilirubinemia, acute encephalopathy, mitral regurg, hyperlipidemia,  anxiety  Additional history: Chart reviewed. Pertinent results include: I reviewed lab work, imaging from recent previous emergency department visits, and hospitalizations for alcohol abuse and withdrawal  Physical Exam: Physical exam performed. The pertinent findings include: Patient ill-appearing at baseline, ready complexion of skin, he has  significant tachycardia heart rate around 1 40-1 50 but with normal rhythm, he has a protuberant abdomen without significant distention, normal respiratory effort, no wheezing, scattered rhonchi are noted and a dry cough.  Lab Tests: I ordered, and personally interpreted labs.  The pertinent results include: CBC notable for leukocytosis, blood cells 14.8, hemoglobin 7.2, suspect some degree of hemoconcentration,/stress response from his severe withdrawal, his metabolic panel is notable for significant bicarb deficit, CO2 9, he has a severe anion gap greater than 20 which is likely secondary to his bicarb deficit as well as his lactic acidosis, lactic acid 11.4.  His creatinine is elevated at 1.43, mild elevation of AST, ALT at 60 and 64 respectively.  RVP negative for COVID, flu, normal magnesium of 2.2, normal lipase of 27.   Imaging Studies: I ordered imaging studies including plain film radiograph of the chest. I independently visualized and interpreted imaging which showed  Poorly defined left hemidiaphragm and left cardiac border stable and  likely due to overlying mediastinal fatty density as seen on CT  chest. Trace left pleural effusion not excluded- recommend chest  x-ray lateral view for further evaluation.  . I agree with the radiologist interpretation.  Will continue to reevaluate, may consider repeat x-ray with lateral view, or additional imaging if no significant improvement with treatment of his acute withdrawal   Cardiac Monitoring:  The patient was maintained on a cardiac monitor.  My attending physician Dr. Dalene Seltzer viewed and  interpreted the cardiac monitored which showed an underlying rhythm of: sinus tachycardia vs svt, suspect sinus tach however. I agree with this interpretation.   Medications: I ordered medication including doses of Ativan, thiamine, multiple fluid bolus for dehydration, tachycardia, acute alcohol withdrawal  Consultations Obtained: I requested consultation with the intensivist, Dr. Katrinka Blazing,  and discussed lab and imaging findings as well as pertinent plan - they recommend: Admission for acute alcohol withdrawal, lactic acidosis   Disposition: After consideration of the diagnostic results and the patients response to treatment, I feel that patient would benefit from ICU admission as discussed above.   I discussed this case with my attending physician Dr. Dalene Seltzer who cosigned this note including patient's presenting symptoms, physical exam, and planned diagnostics and interventions. Attending physician stated agreement with plan or made changes to plan which were implemented.    Final Clinical Impression(s) / ED Diagnoses Final diagnoses:  Alcohol withdrawal syndrome with complication [facility])    Rx / DC Orders ED Discharge Orders     None         Olene Floss, PA-C 02/26/23 1058    Alvira Monday, MD 02/27/23 2306

## 2023-02-27 ENCOUNTER — Ambulatory Visit (HOSPITAL_COMMUNITY): Admission: RE | Admit: 2023-02-27 | Payer: Medicare HMO | Source: Home / Self Care | Admitting: Urology

## 2023-02-27 ENCOUNTER — Encounter (HOSPITAL_COMMUNITY): Admission: RE | Payer: Self-pay | Source: Home / Self Care

## 2023-02-27 DIAGNOSIS — E872 Acidosis, unspecified: Secondary | ICD-10-CM | POA: Diagnosis not present

## 2023-02-27 DIAGNOSIS — F10931 Alcohol use, unspecified with withdrawal delirium: Secondary | ICD-10-CM | POA: Diagnosis not present

## 2023-02-27 LAB — COMPREHENSIVE METABOLIC PANEL
ALT: 40 U/L (ref 0–44)
AST: 38 U/L (ref 15–41)
Albumin: 3.3 g/dL — ABNORMAL LOW (ref 3.5–5.0)
Alkaline Phosphatase: 55 U/L (ref 38–126)
Anion gap: 11 (ref 5–15)
BUN: 13 mg/dL (ref 8–23)
CO2: 21 mmol/L — ABNORMAL LOW (ref 22–32)
Calcium: 7.2 mg/dL — ABNORMAL LOW (ref 8.9–10.3)
Chloride: 99 mmol/L (ref 98–111)
Creatinine, Ser: 1 mg/dL (ref 0.61–1.24)
GFR, Estimated: >60 mL/min (ref >60)
Glucose, Bld: 125 mg/dL — ABNORMAL HIGH (ref 70–99)
Potassium: 4 mmol/L (ref 3.5–5.1)
Sodium: 131 mmol/L — ABNORMAL LOW (ref 135–145)
Total Bilirubin: 2.1 mg/dL — ABNORMAL HIGH (ref <1.2)
Total Protein: 6.1 g/dL — ABNORMAL LOW (ref 6.5–8.1)

## 2023-02-27 LAB — CBC
HCT: 39 % (ref 39.0–52.0)
Hemoglobin: 13.4 g/dL (ref 13.0–17.0)
MCH: 30.7 pg (ref 26.0–34.0)
MCHC: 34.4 g/dL (ref 30.0–36.0)
MCV: 89.2 fL (ref 80.0–100.0)
Platelets: 110 10*3/uL — ABNORMAL LOW (ref 150–400)
RBC: 4.37 MIL/uL (ref 4.22–5.81)
RDW: 17.5 % — ABNORMAL HIGH (ref 11.5–15.5)
WBC: 6.2 10*3/uL (ref 4.0–10.5)
nRBC: 0 % (ref 0.0–0.2)

## 2023-02-27 SURGERY — ABLATION, PROSTATE, TRANSURETHRAL, USING WATERJET
Anesthesia: General

## 2023-02-27 MED ORDER — LACTATED RINGERS IV SOLN: INTRAVENOUS | Status: AC

## 2023-02-27 MED ORDER — FINASTERIDE 5 MG PO TABS
5.0000 mg | ORAL_TABLET | Freq: Every day | ORAL | Status: DC
  Administered 2023-02-27 – 2023-02-28 (×2): 5 mg via ORAL
  Filled 2023-02-27 (×2): qty 1

## 2023-02-27 MED ORDER — QUETIAPINE FUMARATE 100 MG PO TABS
300.0000 mg | ORAL_TABLET | Freq: Every day | ORAL | Status: DC
  Administered 2023-02-27 – 2023-02-28 (×2): 300 mg via ORAL
  Filled 2023-02-27 (×2): qty 3

## 2023-02-27 MED ORDER — MAGNESIUM HYDROXIDE 400 MG/5ML PO SUSP: 30.0000 mL | ORAL | Status: DC

## 2023-02-27 MED ORDER — GABAPENTIN 300 MG PO CAPS
300.0000 mg | ORAL_CAPSULE | Freq: Every day | ORAL | Status: DC
  Administered 2023-02-27 – 2023-02-28 (×2): 300 mg via ORAL
  Filled 2023-02-27 (×2): qty 1

## 2023-02-27 MED ORDER — SENNOSIDES-DOCUSATE SODIUM 8.6-50 MG PO TABS: 1.0000 | ORAL_TABLET | Freq: Every day | ORAL | Status: DC | PRN

## 2023-02-27 MED ORDER — TAMSULOSIN HCL 0.4 MG PO CAPS
0.4000 mg | ORAL_CAPSULE | Freq: Every day | ORAL | Status: DC
  Administered 2023-02-27 – 2023-02-28 (×2): 0.4 mg via ORAL
  Filled 2023-02-27 (×2): qty 1

## 2023-02-27 MED ORDER — METOPROLOL TARTRATE 50 MG PO TABS
50.0000 mg | ORAL_TABLET | Freq: Two times a day (BID) | ORAL | Status: DC
  Administered 2023-02-27 – 2023-02-28 (×4): 50 mg via ORAL
  Filled 2023-02-27 (×3): qty 1
  Filled 2023-02-27: qty 2

## 2023-02-27 NOTE — Plan of Care (Signed)

## 2023-02-27 NOTE — TOC Initial Note (Signed)
Transition of Care [facility]) - Initial/Assessment Note    Patient Details  Name: Ruben [name] MRN: [id number] Date of Birth: October 20, 1950  Transition of [facility]) CM/SW Contact:    Adrian Prows, RN Phone Number: 02/27/2023, 4:17 PM  Clinical Narrative:                 TOC for counseling abuse/education; spoke w/ pt in room; pt says he is from home and plans to return at d/c; he identified POC cousin [name] [phone]); pt says he has transportation; he denies SDOH risks; pt verified he has insurance and PCP; he does not have DME, HH services, or home oxygen; pt says he has a shower chair; pt declines resources for SA; he says he attends AA, and he has a sponsor; TOC will follow.  Expected Discharge Plan: Home/Self Care Barriers to Discharge: Continued Medical Work up   Patient Goals and CMS Choice Patient states their goals for this hospitalization and ongoing recovery are:: home          Expected Discharge Plan and Services   Discharge Planning Services: CM Consult   Living arrangements for the past 2 months: Single Family Home                                      Prior Living Arrangements/Services Living arrangements for the past 2 months: Single Family Home Lives with:: Self   Do you feel safe going back to the place where you live?: Yes      Need for Family Participation in Patient Care: Yes (Comment) Care giver support system in place?: Yes (comment) Current home services:  (n/a) Criminal Activity/Legal Involvement Pertinent to Current Situation/Hospitalization: No - Comment as needed  Activities of Daily Living      Permission Sought/Granted Permission sought to share information with : Case Manager Permission granted to share information with : Yes, Verbal Permission Granted  Share Information with NAME: Case Manager     Permission granted to share info w Relationship: [name] (cousin) [phone]     Emotional  Assessment Appearance:: Appears stated age Attitude/Demeanor/Rapport: Gracious Affect (typically observed): Accepting Orientation: : Oriented to Self, Oriented to Place, Oriented to  Time, Oriented to Situation Alcohol / Substance Use: Alcohol Use Psych Involvement: No (comment)  Admission diagnosis:  Lactic acid acidosis [E87.20] Alcohol withdrawal syndrome with complication (HCC) [F10.939] Patient Active Problem List   Diagnosis Date Noted   Severe alcohol withdrawal delirium (HCC) 02/27/2023   Lactic acid acidosis 02/26/2023   Encephalopathy acute 12/17/2022   Lactic acidosis 12/17/2022   Dehydration 12/17/2022   Acute lactic acidosis 12/17/2022   Abnormal stress test 05/11/2022   Elevated coronary artery calcium score 05/11/2022   Aortic atherosclerosis (HCC) 04/13/2022   Elevated LFTs    Acute kidney injury (HCC) 09/08/2020   Hyponatremia 09/08/2020   Hyperbilirubinemia 09/08/2020   Sinus tachycardia 08/16/2020   Palpitations 08/16/2020   Exertional dyspnea 08/16/2020   Mild renal insufficiency 06/23/2020   Weight loss 04/08/2020   Unsteady gait 04/08/2020   Insomnia 04/08/2020   Lump of skin of left upper extremity 04/08/2020   Irregular heart beat 04/08/2020   Pure hypercholesterolemia 04/08/2020   Alcohol use disorder, severe, dependence (HCC) 04/03/2020   Hyperlipidemia 04/03/2020   Suicidal ideation 04/03/2020   Metabolic acidosis 12/22/2019   Thrombocytopenia (HCC) 12/22/2019   ARF (acute renal failure) (HCC) 10/01/2019   Alcohol  withdrawal (HCC) 10/01/2019   Normochromic normocytic anemia 10/01/2019   GERD (gastroesophageal reflux disease) 06/22/2019   BPH (benign prostatic hyperplasia) 06/22/2019   Essential hypertension 06/22/2019   AKI (acute kidney injury) (HCC)    Alcohol intoxication (HCC) 06/21/2019   Shoulder joint pain 03/31/2019   S/P Nissen fundoplication (without gastrostomy tube) procedure 04/26/2018   Rosacea 06/24/2011   PCP:  Soundra Pilon, FNP Pharmacy:   [facility] [phone] - [place], [place] - [address] [place] [address] Phone: [phone] Fax: [phone]     Social Determinants of Health (SDOH) Social History: SDOH Screenings   Food Insecurity: No Food Insecurity (02/27/2023)  Housing: Low Risk  (02/27/2023)  Transportation Needs: No Transportation Needs (02/27/2023)  Utilities: Not At Risk (02/27/2023)  Tobacco Use: Low Risk  (02/26/2023)   SDOH Interventions: Food Insecurity Interventions: Intervention Not Indicated, Inpatient TOC Housing Interventions: Intervention Not Indicated, Inpatient TOC Transportation Interventions: Intervention Not Indicated, Inpatient TOC Utilities Interventions: Intervention Not Indicated, Inpatient TOC   Readmission Risk Interventions    02/27/2023    4:15 PM 02/08/2023    2:47 PM 02/02/2023    6:23 PM  Readmission Risk Prevention Plan  Transportation Screening Complete Complete Complete  PCP or Specialist Appt within 5-7 Days   Complete  PCP or Specialist Appt within 3-5 Days Complete Complete   Home Care Screening   Complete  Medication Review (RN CM)   Complete  HRI or Home Care Consult Complete Complete   Social Work Consult for Recovery Care Planning/Counseling Complete    Palliative Care Screening Not Applicable Not Applicable   Medication Review [employer]) Complete Complete

## 2023-02-27 NOTE — Hospital Course (Signed)
72 year old male with past medical history of extensive alcohol use, GERD, mitral regurgitation, BPH, HLD, and anxiety who presented to the ED at [facility] 11/18 with complaints of nausea, tachycardia, and cough.  Has been drinking alcohol as outpatient around 700 mL of liquor every day.Marland Kitchen  History of alcohol withdrawal seizures in the past.  Patient was initially admitted to ICU for severe alcohol withdrawal.  In the ED patient was tachycardic and hypotensive.  Initial lab showed CO2 of 9 with creatinine of 1.4 and lactic acid was 11.4.  WBC was elevated at 14.8 with hemoglobin of 17.2.  COVID influenza and RSV was negative.  EKG showed normal sinus tachycardia.  Chest x-ray with no obvious infiltrate.  In the ED patient received IV fluid boluses, started on phenobarbital in CIWA protocol with Ativan and was admitted to ICU.  Assessment plan.  Severe alcohol withdrawal with delirium tremens.   Severe lactic acidosis  History of alcohol withdrawal seizures.has had similar presentations in the past.  Patient wishes to quit. on phenobarbital taper, as needed Ativan.  Continue CIWA protocol.  Continue thiamine folic acid multivitamin.  Initially required bicarb drip.  Off bicarb drip at this time.  Continue to monitor BMP.  Continue seizure precautions.  Repeat lactate at 8.0.  Acute kidney injury.  Creatinine of 1.4 on presentation.  Received IV hydration.  On Ringer lactate 100 mL/h.  Essential hypertension Patient is on metoprolol tamsulosin at home including clonidine.  Hyperlipidemia On Crestor at home.  History of BPH.  On tamsulosin and finasteride at home.

## 2023-02-27 NOTE — Discharge Summary (Signed)
PROGRESS NOTE    Ruben [name]  NFA:[id number] DOB: Aug 10, 1950 DOA: 02/26/2023 PCP: Soundra Pilon, FNP    Brief Narrative:  72 year old male with past medical history of extensive alcohol use, GERD, mitral regurgitation, BPH, HLD, and anxiety who presented to the ED at [facility] 11/18 with complaints of nausea, tachycardia, and cough.  Has been drinking alcohol as outpatient around 700 mL of liquor every day.Marland Kitchen  History of alcohol withdrawal seizures in the past.  Patient was initially admitted to ICU for severe alcohol withdrawal.  In the ED, patient was tachycardic and hypotensive.  Initial lab showed CO2 of 9 with creatinine of 1.4 and lactic acid was 11.4.  WBC was elevated at 14.8 with hemoglobin of 17.2.  COVID influenza and RSV was negative.  EKG showed normal sinus tachycardia.  Chest x-ray with no obvious infiltrate.  In the ED patient received IV fluid boluses, started on phenobarbital in CIWA protocol with Ativan and was admitted to ICU.  Assessment plan.  Severe alcohol withdrawal with delirium tremens.   Severe lactic acidosis  History of alcohol withdrawal seizures.has had similar presentations in the past.  Patient wishes to quit. on phenobarbital taper, as needed Ativan.  Continue CIWA protocol.  Continue thiamine folic acid multivitamin.  Initially required bicarb drip.  Off bicarb drip at this time.  Continue to monitor BMP.  Continue seizure precautions.  Repeat lactate at 8.0.  Will check check lactate levels in AM.  Continue IV fluids for 1 more day.  At this moment patient denies any tremors or hallucinations.  He seems to be motivated to quit but anxiety is an issue.  TOC consultation for substance abuse and assistance.  Acute kidney injury.  Creatinine of 1.4 on presentation.  Received IV hydration.  On Ringer lactate 100 mL/h.  Check BMP in AM.  Essential hypertension Patient is on metoprolol tamsulosin at home including clonidine.  Currently on clonidine patch,  labetalol as needed.  Will resume tamsulosin from home.  Hyperlipidemia On Crestor at home.  Continue to hold for now.  History of BPH.  On tamsulosin and finasteride at home.  Will resume.     DVT prophylaxis: enoxaparin (LOVENOX) injection 40 mg Start: 02/26/23 2200 SCDs Start: 02/26/23 1021   Code Status:     Code Status: Full Code  Disposition: Home likely in 1 to 2 days  Status is: Inpatient  Remains inpatient appropriate because: Severe alcohol withdrawal, on detox protocol   Family Communication: None at bedside  Consultants:  PCCM  Procedures:  None  Antimicrobials:  None  Anti-infectives (From admission, onward)    None        Subjective: Today, patient was seen and examined at bedside.  States that he feels a little better.  Denies any tremors hallucinations.  Feels anxious.  Objective: Vitals:   02/27/23 0700 02/27/23 0727 02/27/23 0748 02/27/23 0800  BP: (!) 151/77   119/74  Pulse: 89 88 84 85  Resp: 15 (!) 23 17 17   Temp:   98 F (36.7 C)   TempSrc:   Oral   SpO2: 94% 96% 99% 98%  Weight:      Height:        Intake/Output Summary (Last 24 hours) at 02/27/2023 1005 Last data filed at 02/27/2023 0800 Gross per 24 hour  Intake 3154.74 ml  Output 850 ml  Net 2304.74 ml   Filed Weights   02/26/23 0738  Weight: 102 kg    Physical Examination:  Body mass index is 31.36 kg/m.   General: Obese built, not in obvious distress, on room air HENT:   No scleral pallor or icterus noted. Oral mucosa is moist.  Chest:  Clear breath sounds.  No crackles or wheezes.  CVS: S1 &S2 heard. No murmur.  Regular rate and rhythm. Abdomen: Soft, nontender, nondistended.  Bowel sounds are heard.   Extremities: No cyanosis, clubbing or edema.  Peripheral pulses are palpable. Psych: Alert, awake and oriented, normal mood CNS:  No cranial nerve deficits.  Moves all extremities Skin: Warm and dry.  No rashes noted.  Data Reviewed:   CBC: Recent Labs   Lab 02/26/23 0750 02/27/23 0557  WBC 14.8* 6.2  HGB 17.2* 13.4  HCT 56.9* 39.0  MCV 97.8 89.2  PLT 190 110*    Basic Metabolic Panel: Recent Labs  Lab 02/26/23 0750 02/27/23 0318  NA 138 131*  K 4.8 4.0  CL 99 99  CO2 9* 21*  GLUCOSE 93 125*  BUN 15 13  CREATININE 1.43* 1.00  CALCIUM 8.9 7.2*  MG 2.2  --     Liver Function Tests: Recent Labs  Lab 02/26/23 0750 02/27/23 0318  AST 60* 38  ALT 64* 40  ALKPHOS 76 55  BILITOT 1.1 2.1*  PROT 8.3* 6.1*  ALBUMIN 4.4 3.3*     Radiology Studies: DG Abd 1 View  Result Date: 02/26/2023 CLINICAL DATA:  Constipation evaluation. EXAM: ABDOMEN - 1 VIEW COMPARISON:  10/01/2019. FINDINGS: The bowel gas pattern is non-obstructive.  No abnormal stool burden. No evidence of pneumoperitoneum, within the limitations of a supine film. No acute osseous abnormalities. The soft tissues are within normal limits. Surgical changes, devices, tubes and lines: None. IMPRESSION: *Nonobstructive bowel gas pattern.  No abnormal stool burden. Electronically Signed   By: Jules Schick M.D.   On: 02/26/2023 12:49   DG Chest Portable 1 View  Result Date: 02/26/2023 CLINICAL DATA:  shob EXAM: PORTABLE CHEST 1 VIEW COMPARISON:  Chest x-ray 08/11/2022, CT chest 12/17/2022, chest x-ray 06/22/2020 FINDINGS: The heart and mediastinal contours are unchanged. Poorly defined left hemidiaphragm and left cardiac border stable and likely due to overlying mediastinal fatty density as seen on CT chest. No focal consolidation. No pulmonary edema. Trace left pleural effusion not excluded. No right pleural effusion. No pneumothorax. No acute osseous abnormality. IMPRESSION: Poorly defined left hemidiaphragm and left cardiac border stable and likely due to overlying mediastinal fatty density as seen on CT chest. Trace left pleural effusion not excluded- recommend chest x-ray lateral view for further evaluation. Electronically Signed   By: Tish Frederickson M.D.   On:  02/26/2023 09:29      LOS: 1 day     Joycelyn Das, MD [facility] Available via Epic secure chat 7am-7pm After these hours, please refer to coverage provider listed on [email or web address] 02/27/2023, 10:05 AM

## 2023-02-27 NOTE — Plan of Care (Signed)

## 2023-02-28 DIAGNOSIS — E876 Hypokalemia: Secondary | ICD-10-CM

## 2023-02-28 DIAGNOSIS — I10 Essential (primary) hypertension: Secondary | ICD-10-CM

## 2023-02-28 DIAGNOSIS — F10931 Alcohol use, unspecified with withdrawal delirium: Secondary | ICD-10-CM | POA: Diagnosis not present

## 2023-02-28 LAB — CBC
HCT: 41.6 % (ref 39.0–52.0)
Hemoglobin: 13.8 g/dL (ref 13.0–17.0)
MCH: 29.6 pg (ref 26.0–34.0)
MCHC: 33.2 g/dL (ref 30.0–36.0)
MCV: 89.3 fL (ref 80.0–100.0)
Platelets: 84 10*3/uL — ABNORMAL LOW (ref 150–400)
RBC: 4.66 MIL/uL (ref 4.22–5.81)
RDW: 17.2 % — ABNORMAL HIGH (ref 11.5–15.5)
WBC: 3 10*3/uL — ABNORMAL LOW (ref 4.0–10.5)
nRBC: 0 % (ref 0.0–0.2)

## 2023-02-28 LAB — COMPREHENSIVE METABOLIC PANEL
ALT: 36 U/L (ref 0–44)
AST: 32 U/L (ref 15–41)
Albumin: 3.2 g/dL — ABNORMAL LOW (ref 3.5–5.0)
Alkaline Phosphatase: 57 U/L (ref 38–126)
Anion gap: 9 (ref 5–15)
BUN: 13 mg/dL (ref 8–23)
CO2: 26 mmol/L (ref 22–32)
Calcium: 8.1 mg/dL — ABNORMAL LOW (ref 8.9–10.3)
Chloride: 101 mmol/L (ref 98–111)
Creatinine, Ser: 0.94 mg/dL (ref 0.61–1.24)
GFR, Estimated: >60 mL/min (ref >60)
Glucose, Bld: 101 mg/dL — ABNORMAL HIGH (ref 70–99)
Potassium: 3.3 mmol/L — ABNORMAL LOW (ref 3.5–5.1)
Sodium: 136 mmol/L (ref 135–145)
Total Bilirubin: 1.1 mg/dL (ref <1.2)
Total Protein: 5.9 g/dL — ABNORMAL LOW (ref 6.5–8.1)

## 2023-02-28 LAB — MAGNESIUM: Magnesium: 2 mg/dL (ref 1.7–2.4)

## 2023-02-28 LAB — LACTIC ACID, PLASMA: Lactic Acid, Venous: 1.2 mmol/L (ref 0.5–1.9)

## 2023-02-28 MED ORDER — POTASSIUM CHLORIDE CRYS ER 20 MEQ PO TBCR
40.0000 meq | EXTENDED_RELEASE_TABLET | Freq: Once | ORAL | Status: AC
  Administered 2023-02-28: 40 meq via ORAL
  Filled 2023-02-28: qty 2

## 2023-02-28 MED ORDER — PHENOBARBITAL SODIUM 65 MG/ML IJ SOLN
65.0000 mg | Freq: Three times a day (TID) | INTRAMUSCULAR | Status: AC
Start: 2023-02-28 — End: 2023-02-28
  Administered 2023-02-28 (×2): 65 mg via INTRAVENOUS
  Filled 2023-02-28 (×2): qty 1

## 2023-02-28 MED ORDER — PHENOBARBITAL SODIUM 65 MG/ML IJ SOLN: 65.0000 mg | Freq: Every day | INTRAMUSCULAR | Status: DC

## 2023-02-28 NOTE — Progress Notes (Signed)
Mobility Specialist - Progress Note   02/28/23 1100  Mobility  Activity Transferred from bed to chair  Level of Assistance Standby assist, set-up cues, supervision of patient - no hands on  Assistive Device None  Range of Motion/Exercises Active  Activity Response Tolerated well  Mobility Referral Yes  $Mobility charge 1 Mobility  Mobility Specialist Start Time (ACUTE ONLY) 1140  Mobility Specialist Stop Time (ACUTE ONLY) 1148  Mobility Specialist Time Calculation (min) (ACUTE ONLY) 8 min   Received in bed and agreed to transfer. No issues and left in chair with all needs met. Alarm on.  Marilynne Halsted Mobility Specialist

## 2023-02-28 NOTE — Plan of Care (Signed)

## 2023-02-28 NOTE — Progress Notes (Signed)
[facility] PROGRESS NOTE   Ruben [name] ZOX:[id number] DOB: Mar 12, 1951 DOA: 02/26/2023  PCP: Soundra Pilon, FNP  Brief History: 72 year old male with past medical history of extensive alcohol use, GERD, mitral regurgitation, BPH, HLD, and anxiety who presented to the ED at [facility] 11/18 with complaints of nausea, tachycardia, and cough.  Has been drinking alcohol as outpatient around 700 mL of liquor every day.Marland Kitchen  History of alcohol withdrawal seizures in the past.  Patient was initially admitted to ICU for severe alcohol withdrawal.  Patient was stabilized and then transferred to the floor.  Consultants: Critical care medicine  Procedures: None yet    Subjective/Interval History: Somnolent this morning.  Denies any chest pain shortness of breath nausea vomiting abdominal pain.    Assessment/Plan:  Severe alcohol withdrawal with delirium tremens Initially admitted to intensive care unit.  Stabilized.  Started on phenobarbital taper.  Withdrawal symptoms appear to be improving.  He is somewhat somnolent this morning but easily arousable.  He will complete phenobarbital taper today.  Noted to be on thiamine folic acid and multivitamins.  He has been counseled. Start mobilizing.  He is on heart healthy diet. His lactic acid level was significantly elevated at 11.4.  Noted to be normal this morning.  Likely or different by his alcoholism.  Hypokalemia Will be repleted.  Magnesium 2.0.  Thrombocytopenia Drop in platelet counts noted.  Platelet count was 190 on 11/18.  However it was 128 on 11/2.  Has been as low as 63 back in 2022.  So there is some chronicity to his thrombocytopenia.  Probably due to his alcoholism.  However will hold his Lovenox.  Essential hypertension On metoprolol and clonidine prior to admission.  Currently on clonidine patch.  Hyperlipidemia Holding statin for now  BPH Continue tamsulosin and finasteride.  Obesity Estimated body mass  index is 31.36 kg/m as calculated from the following:   Height as of this encounter: 5\' 11"  (1.803 m).   Weight as of this encounter: 102 kg.   DVT Prophylaxis: Lovenox will be held due to the drop in platelet counts Code Status: Full code Family Communication: Discussed with patient Disposition Plan: Start mobilizing.  Hopefully return home in 24 to 48 hours     Medications: Scheduled:  bisacodyl  10 mg Rectal Q0600   Chlorhexidine Gluconate Cloth  6 each Topical Daily   cloNIDine  0.1 mg Transdermal Weekly   enoxaparin (LOVENOX) injection  40 mg Subcutaneous Q24H   finasteride  5 mg Oral Daily   folic acid  1 mg Oral Daily   gabapentin  300 mg Oral QHS   metoprolol tartrate  50 mg Oral BID   multivitamin with minerals  1 tablet Oral Daily   PHENObarbital  65 mg Intravenous TID   Followed by   PHENObarbital  65 mg Intravenous QHS   QUEtiapine  300 mg Oral QHS   tamsulosin  0.4 mg Oral QHS   thiamine  100 mg Oral Daily   Or   thiamine  100 mg Intravenous Daily   Continuous: [id number]:[id number], LORazepam **OR** LORazepam, senna-docusate  Antibiotics: Anti-infectives (From admission, onward)    None       Objective:  Vital Signs  Vitals:   02/27/23 2123 02/28/23 0102 02/28/23 0452 02/28/23 0936  BP: (!) 145/88 113/66 128/79 115/75  Pulse: 89 72 70 (!) 107  Resp:  14 15 17   Temp:  98.2 F (36.8 C) 98 F (36.7 C) 97.7 F (36.5  C)  TempSrc:    Oral  SpO2:  96% 95% 97%  Weight:      Height:        Intake/Output Summary (Last 24 hours) at 02/28/2023 0952 Last data filed at 02/27/2023 1800 Gross per 24 hour  Intake 787.78 ml  Output 200 ml  Net 587.78 ml   Filed Weights   02/26/23 0738  Weight: 102 kg    General appearance: Somnolent but easily arousable.  In no distress Resp: Clear to auscultation bilaterally.  Normal effort Cardio: S1-S2 is normal regular.  No S3-S4.  No rubs murmurs or bruit GI: Abdomen is soft.  Nontender nondistended.  Bowel  sounds are present normal.  No masses organomegaly Extremities: No edema.  Full range of motion of lower extremities.  Lab Results:  Data Reviewed: I have personally reviewed following labs and reports of the imaging studies  CBC: Recent Labs  Lab 02/26/23 0750 02/27/23 0557 02/28/23 0538  WBC 14.8* 6.2 3.0*  HGB 17.2* 13.4 13.8  HCT 56.9* 39.0 41.6  MCV 97.8 89.2 89.3  PLT 190 110* 84*    Basic Metabolic Panel: Recent Labs  Lab 02/26/23 0750 02/27/23 0318 02/28/23 0538  NA 138 131* 136  K 4.8 4.0 3.3*  CL 99 99 101  CO2 9* 21* 26  GLUCOSE 93 125* 101*  BUN 15 13 13   CREATININE 1.43* 1.00 0.94  CALCIUM 8.9 7.2* 8.1*  MG 2.2  --  2.0    GFR: Estimated Creatinine Clearance: 87.7 mL/min (by C-G formula based on SCr of 0.94 mg/dL).  Liver Function Tests: Recent Labs  Lab 02/26/23 0750 02/27/23 0318 02/28/23 0538  AST 60* 38 32  ALT 64* 40 36  ALKPHOS 76 55 57  BILITOT 1.1 2.1* 1.1  PROT 8.3* 6.1* 5.9*  ALBUMIN 4.4 3.3* 3.2*    Recent Labs  Lab 02/26/23 0750  LIPASE 27   Recent Labs  Lab 02/26/23 1241  AMMONIA 29    Coagulation Profile: Recent Labs  Lab 02/26/23 1245  INR 1.1     Recent Results (from the past 240 hour(s))  Resp panel by RT-PCR (RSV, Flu A&B, Covid) Anterior Nasal Swab     Status: None   Collection Time: 02/26/23  7:50 AM   Specimen: Anterior Nasal Swab  Result Value Ref Range Status   SARS Coronavirus 2 by RT PCR NEGATIVE NEGATIVE Final    Comment: (NOTE) SARS-CoV-2 target nucleic acids are NOT DETECTED.  The SARS-CoV-2 RNA is generally detectable in upper respiratory specimens during the acute phase of infection. The lowest concentration of SARS-CoV-2 viral copies this assay can detect is 138 copies/mL. A negative result does not preclude SARS-Cov-2 infection and should not be used as the sole basis for treatment or other patient management decisions. A negative result may occur with  improper specimen  collection/handling, submission of specimen other than nasopharyngeal swab, presence of viral mutation(s) within the areas targeted by this assay, and inadequate number of viral copies(<138 copies/mL). A negative result must be combined with clinical observations, patient history, and epidemiological information. The expected result is Negative.  Fact Sheet for Patients:  BloggerCourse.com  Fact Sheet for Healthcare Providers:  SeriousBroker.it  This test is no t yet approved or cleared by the [place] FDA and  has been authorized for detection and/or diagnosis of SARS-CoV-2 by FDA under an Emergency Use Authorization (EUA). This EUA will remain  in effect (meaning this test can be used) for the duration of the  COVID-19 declaration under Section 564(b)(1) of the Act, 21 U.S.C.section 360bbb-3(b)(1), unless the authorization is terminated  or revoked sooner.       Influenza A by PCR NEGATIVE NEGATIVE Final   Influenza B by PCR NEGATIVE NEGATIVE Final    Comment: (NOTE) The Xpert Xpress SARS-CoV-2/FLU/RSV plus assay is intended as an aid in the diagnosis of influenza from Nasopharyngeal swab specimens and should not be used as a sole basis for treatment. Nasal washings and aspirates are unacceptable for Xpert Xpress SARS-CoV-2/FLU/RSV testing.  Fact Sheet for Patients: BloggerCourse.com  Fact Sheet for Healthcare Providers: SeriousBroker.it  This test is not yet approved or cleared by the [place] FDA and has been authorized for detection and/or diagnosis of SARS-CoV-2 by FDA under an Emergency Use Authorization (EUA). This EUA will remain in effect (meaning this test can be used) for the duration of the COVID-19 declaration under Section 564(b)(1) of the Act, 21 U.S.C. section 360bbb-3(b)(1), unless the authorization is terminated or revoked.     Resp Syncytial  Virus by PCR NEGATIVE NEGATIVE Final    Comment: (NOTE) Fact Sheet for Patients: BloggerCourse.com  Fact Sheet for Healthcare Providers: SeriousBroker.it  This test is not yet approved or cleared by the [place] FDA and has been authorized for detection and/or diagnosis of SARS-CoV-2 by FDA under an Emergency Use Authorization (EUA). This EUA will remain in effect (meaning this test can be used) for the duration of the COVID-19 declaration under Section 564(b)(1) of the Act, 21 U.S.C. section 360bbb-3(b)(1), unless the authorization is terminated or revoked.  Performed at [facility], [address], [place], [place] [address]   MRSA Next Gen by PCR, Nasal     Status: None   Collection Time: 02/26/23 12:00 PM   Specimen: Nasal Mucosa; Nasal Swab  Result Value Ref Range Status   MRSA by PCR Next Gen NOT DETECTED NOT DETECTED Final    Comment: (NOTE) The GeneXpert MRSA Assay (FDA approved for NASAL specimens only), is one component of a comprehensive MRSA colonization surveillance program. It is not intended to diagnose MRSA infection nor to guide or monitor treatment for MRSA infections. Test performance is not FDA approved in patients less than 21 years old. Performed at [facility], [address] [place], [place] [address]       Radiology Studies: DG Abd 1 View  Result Date: 02/26/2023 CLINICAL DATA:  Constipation evaluation. EXAM: ABDOMEN - 1 VIEW COMPARISON:  10/01/2019. FINDINGS: The bowel gas pattern is non-obstructive.  No abnormal stool burden. No evidence of pneumoperitoneum, within the limitations of a supine film. No acute osseous abnormalities. The soft tissues are within normal limits. Surgical changes, devices, tubes and lines: None. IMPRESSION: *Nonobstructive bowel gas pattern.  No abnormal stool burden. Electronically Signed   By: Jules Schick M.D.   On: 02/26/2023  12:49       LOS: 2 days   Kyanna Mahrt Rito Ehrlich  [facility] Pager on [email or web address]  02/28/2023, 9:52 AM

## 2023-03-01 ENCOUNTER — Other Ambulatory Visit: Payer: Medicare HMO

## 2023-03-01 DIAGNOSIS — E872 Acidosis, unspecified: Secondary | ICD-10-CM | POA: Diagnosis not present

## 2023-03-01 DIAGNOSIS — F10939 Alcohol use, unspecified with withdrawal, unspecified: Secondary | ICD-10-CM

## 2023-03-01 LAB — COMPREHENSIVE METABOLIC PANEL
ALT: 39 U/L (ref 0–44)
AST: 37 U/L (ref 15–41)
Albumin: 3.2 g/dL — ABNORMAL LOW (ref 3.5–5.0)
Alkaline Phosphatase: 55 U/L (ref 38–126)
Anion gap: 11 (ref 5–15)
BUN: 15 mg/dL (ref 8–23)
CO2: 22 mmol/L (ref 22–32)
Calcium: 8.5 mg/dL — ABNORMAL LOW (ref 8.9–10.3)
Chloride: 103 mmol/L (ref 98–111)
Creatinine, Ser: 0.89 mg/dL (ref 0.61–1.24)
GFR, Estimated: >60 mL/min (ref >60)
Glucose, Bld: 96 mg/dL (ref 70–99)
Potassium: 3.6 mmol/L (ref 3.5–5.1)
Sodium: 136 mmol/L (ref 135–145)
Total Bilirubin: 1.1 mg/dL (ref <1.2)
Total Protein: 6.1 g/dL — ABNORMAL LOW (ref 6.5–8.1)

## 2023-03-01 LAB — MAGNESIUM: Magnesium: 2 mg/dL (ref 1.7–2.4)

## 2023-03-01 LAB — CBC
HCT: 43.2 % (ref 39.0–52.0)
Hemoglobin: 14.4 g/dL (ref 13.0–17.0)
MCH: 30.1 pg (ref 26.0–34.0)
MCHC: 33.3 g/dL (ref 30.0–36.0)
MCV: 90.2 fL (ref 80.0–100.0)
Platelets: 83 10*3/uL — ABNORMAL LOW (ref 150–400)
RBC: 4.79 MIL/uL (ref 4.22–5.81)
RDW: 17 % — ABNORMAL HIGH (ref 11.5–15.5)
WBC: 3.6 10*3/uL — ABNORMAL LOW (ref 4.0–10.5)
nRBC: 0 % (ref 0.0–0.2)

## 2023-03-01 MED ORDER — LORAZEPAM 0.5 MG PO TABS: 0.5000 mg | ORAL_TABLET | Freq: Three times a day (TID) | ORAL | 0 refills | Status: AC | PRN

## 2023-03-01 MED ORDER — VITAMIN B-1 100 MG PO TABS: 100.0000 mg | ORAL_TABLET | Freq: Every day | ORAL | 0 refills | Status: DC

## 2023-03-01 MED ORDER — FOLIC ACID 1 MG PO TABS: 1.0000 mg | ORAL_TABLET | Freq: Every day | ORAL | 1 refills | Status: DC

## 2023-03-01 NOTE — Plan of Care (Signed)

## 2023-03-01 NOTE — Consult Note (Addendum)
[facility] Liaison Consult Note    03/01/2023  Ruben [name] 02/25/1951 [id number]  Addendum Insurance: [employer]   Primary Care Provider: Soundra Pilon, FNP with Deboraha Sprang at [facility], this provider is listed for the transition of care follow up appointments  and Transitional calls. Left patient a HIPAA approved voicemail message via phone number provided requesting a return call.   RN Hospital Liaison screened the patient remotely at [facility].    The patient was screened for 30 day readmission hospitalization with noted medium high risk score for unplanned readmission risk 4 hospital admissions in 6 months.  The patient was assessed for potential Community Care Coordination service needs for post hospital transition for care coordination. Review of patient's electronic medical record reveals patient is transitioning home.   Plan: .  Referral request for community care coordination: will update [facility] transition team of post hospital and potential community follow up needs.   [facility] Community Care, Population Health does not replace or interfere with any arrangements made by the Inpatient Transition of Care team.   For questions contact:   Charlesetta Shanks, RN, BSN, CCM [place]  [facility], Population Health, [facility] Liaison Direct Dial: [phone] or secure chat Email: [email or web address]

## 2023-03-01 NOTE — Discharge Summary (Signed)
[facility]  Physician Discharge Summary   Patient ID: Ruben [name] MRN: [id number] DOB/AGE: 1950-11-15 72 y.o.  Admit date: 02/26/2023 Discharge date: 03/01/2023    PCP: Soundra Pilon, FNP  DISCHARGE DIAGNOSES:    Severe alcohol withdrawal delirium (HCC)   Lactic acid acidosis   GERD (gastroesophageal reflux disease)   BPH (benign prostatic hyperplasia)   Essential hypertension   AKI (acute kidney injury) (HCC)   Hyperlipidemia   RECOMMENDATIONS FOR OUTPATIENT FOLLOW UP: Patient urged to stop drinking alcohol.    Home Health: None Equipment/Devices: None  CODE STATUS: Full code  DISCHARGE CONDITION: fair  Diet recommendation: As before  INITIAL HISTORY: 72 year old male with past medical history of extensive alcohol use, GERD, mitral regurgitation, BPH, HLD, and anxiety who presented to the ED at [facility] 11/18 with complaints of nausea, tachycardia, and cough.  Has been drinking alcohol as outpatient around 700 mL of liquor every day.Marland Kitchen  History of alcohol withdrawal seizures in the past.  Patient was initially admitted to ICU for severe alcohol withdrawal.  Patient was stabilized and then transferred to the floor.   Consultants: Critical care medicine   Procedures: None  HOSPITAL COURSE:   Severe alcohol withdrawal with delirium tremens Initially admitted to intensive care unit.  Stabilized.  Started on phenobarbital taper.   He has been counseled. His lactic acid level was significantly elevated at 11.4.  Noted to be normal subsequently.  Likely secondary to alcoholism. Mentation has significantly improved.  He is tolerating his diet.  Has ambulated.   Hypokalemia Supplemented.  Magnesium 2.0.   Thrombocytopenia Drop in platelet counts noted.  Platelet count was 190 on 11/18.  However it was 128 on 11/2.  Has been as low as 63 back in 2022.  So there is some chronicity to his thrombocytopenia.  Probably due to his alcoholism.  No overt bleeding  noted.   Essential hypertension May resume his home medications   Hyperlipidemia    BPH Continue tamsulosin and finasteride.   Obesity Estimated body mass index is 31.36 kg/m as calculated from the following:   Height as of this encounter: 5\' 11"  (1.803 m).   Weight as of this encounter: 102 kg.   Patient is stable.  Okay for discharge home today.   PERTINENT LABS:  The results of significant diagnostics from this hospitalization (including imaging, microbiology, ancillary and laboratory) are listed below for reference.    Microbiology: Recent Results (from the past 240 hour(s))  Resp panel by RT-PCR (RSV, Flu A&B, Covid) Anterior Nasal Swab     Status: None   Collection Time: 02/26/23  7:50 AM   Specimen: Anterior Nasal Swab  Result Value Ref Range Status   SARS Coronavirus 2 by RT PCR NEGATIVE NEGATIVE Final    Comment: (NOTE) SARS-CoV-2 target nucleic acids are NOT DETECTED.  The SARS-CoV-2 RNA is generally detectable in upper respiratory specimens during the acute phase of infection. The lowest concentration of SARS-CoV-2 viral copies this assay can detect is 138 copies/mL. A negative result does not preclude SARS-Cov-2 infection and should not be used as the sole basis for treatment or other patient management decisions. A negative result may occur with  improper specimen collection/handling, submission of specimen other than nasopharyngeal swab, presence of viral mutation(s) within the areas targeted by this assay, and inadequate number of viral copies(<138 copies/mL). A negative result must be combined with clinical observations, patient history, and epidemiological information. The expected result is Negative.  Fact Sheet  for Patients:  BloggerCourse.com  Fact Sheet for Healthcare Providers:  SeriousBroker.it  This test is no t yet approved or cleared by the [place] FDA and  has been authorized for  detection and/or diagnosis of SARS-CoV-2 by FDA under an Emergency Use Authorization (EUA). This EUA will remain  in effect (meaning this test can be used) for the duration of the COVID-19 declaration under Section 564(b)(1) of the Act, 21 U.S.C.section 360bbb-3(b)(1), unless the authorization is terminated  or revoked sooner.       Influenza A by PCR NEGATIVE NEGATIVE Final   Influenza B by PCR NEGATIVE NEGATIVE Final    Comment: (NOTE) The Xpert Xpress SARS-CoV-2/FLU/RSV plus assay is intended as an aid in the diagnosis of influenza from Nasopharyngeal swab specimens and should not be used as a sole basis for treatment. Nasal washings and aspirates are unacceptable for Xpert Xpress SARS-CoV-2/FLU/RSV testing.  Fact Sheet for Patients: BloggerCourse.com  Fact Sheet for Healthcare Providers: SeriousBroker.it  This test is not yet approved or cleared by the [place] FDA and has been authorized for detection and/or diagnosis of SARS-CoV-2 by FDA under an Emergency Use Authorization (EUA). This EUA will remain in effect (meaning this test can be used) for the duration of the COVID-19 declaration under Section 564(b)(1) of the Act, 21 U.S.C. section 360bbb-3(b)(1), unless the authorization is terminated or revoked.     Resp Syncytial Virus by PCR NEGATIVE NEGATIVE Final    Comment: (NOTE) Fact Sheet for Patients: BloggerCourse.com  Fact Sheet for Healthcare Providers: SeriousBroker.it  This test is not yet approved or cleared by the [place] FDA and has been authorized for detection and/or diagnosis of SARS-CoV-2 by FDA under an Emergency Use Authorization (EUA). This EUA will remain in effect (meaning this test can be used) for the duration of the COVID-19 declaration under Section 564(b)(1) of the Act, 21 U.S.C. section 360bbb-3(b)(1), unless the authorization is  terminated or revoked.  Performed at [facility], [address] [place], [place] [address]   MRSA Next Gen by PCR, Nasal     Status: None   Collection Time: 02/26/23 12:00 PM   Specimen: Nasal Mucosa; Nasal Swab  Result Value Ref Range Status   MRSA by PCR Next Gen NOT DETECTED NOT DETECTED Final    Comment: (NOTE) The GeneXpert MRSA Assay (FDA approved for NASAL specimens only), is one component of a comprehensive MRSA colonization surveillance program. It is not intended to diagnose MRSA infection nor to guide or monitor treatment for MRSA infections. Test performance is not FDA approved in patients less than 67 years old. Performed at [facility], [address], [place], [place] [address]      Labs:   Basic Metabolic Panel: Recent Labs  Lab 02/26/23 0750 02/27/23 0318 02/28/23 0538 03/01/23 0556  NA 138 131* 136 136  K 4.8 4.0 3.3* 3.6  CL 99 99 101 103  CO2 9* 21* 26 22  GLUCOSE 93 125* 101* 96  BUN 15 13 13 15   CREATININE 1.43* 1.00 0.94 0.89  CALCIUM 8.9 7.2* 8.1* 8.5*  MG 2.2  --  2.0 2.0   Liver Function Tests: Recent Labs  Lab 02/26/23 0750 02/27/23 0318 02/28/23 0538 03/01/23 0556  AST 60* 38 32 37  ALT 64* 40 36 39  ALKPHOS 76 55 57 55  BILITOT 1.1 2.1* 1.1 1.1  PROT 8.3* 6.1* 5.9* 6.1*  ALBUMIN 4.4 3.3* 3.2* 3.2*   Recent Labs  Lab 02/26/23  0750  LIPASE 27   Recent Labs  Lab 02/26/23 1241  AMMONIA 29   CBC: Recent Labs  Lab 02/26/23 0750 02/27/23 0557 02/28/23 0538 03/01/23 0556  WBC 14.8* 6.2 3.0* 3.6*  HGB 17.2* 13.4 13.8 14.4  HCT 56.9* 39.0 41.6 43.2  MCV 97.8 89.2 89.3 90.2  PLT 190 110* 84* 83*     IMAGING STUDIES DG Abd 1 View  Result Date: 02/26/2023 CLINICAL DATA:  Constipation evaluation. EXAM: ABDOMEN - 1 VIEW COMPARISON:  10/01/2019. FINDINGS: The bowel gas pattern is non-obstructive.  No abnormal stool burden. No evidence of pneumoperitoneum, within the limitations  of a supine film. No acute osseous abnormalities. The soft tissues are within normal limits. Surgical changes, devices, tubes and lines: None. IMPRESSION: *Nonobstructive bowel gas pattern.  No abnormal stool burden. Electronically Signed   By: Jules Schick M.D.   On: 02/26/2023 12:49   DG Chest Portable 1 View  Result Date: 02/26/2023 CLINICAL DATA:  shob EXAM: PORTABLE CHEST 1 VIEW COMPARISON:  Chest x-ray 08/11/2022, CT chest 12/17/2022, chest x-ray 06/22/2020 FINDINGS: The heart and mediastinal contours are unchanged. Poorly defined left hemidiaphragm and left cardiac border stable and likely due to overlying mediastinal fatty density as seen on CT chest. No focal consolidation. No pulmonary edema. Trace left pleural effusion not excluded. No right pleural effusion. No pneumothorax. No acute osseous abnormality. IMPRESSION: Poorly defined left hemidiaphragm and left cardiac border stable and likely due to overlying mediastinal fatty density as seen on CT chest. Trace left pleural effusion not excluded- recommend chest x-ray lateral view for further evaluation. Electronically Signed   By: Tish Frederickson M.D.   On: 02/26/2023 09:29     DISCHARGE EXAMINATION: Vitals:   02/28/23 1354 02/28/23 1700 02/28/23 1926 03/01/23 0443  BP: 138/82 138/77 137/81 132/79  Pulse: 86 84 75 72  Resp: 17 20 16 16   Temp: 98.6 F (37 C) 98.4 F (36.9 C) 97.6 F (36.4 C) 98 F (36.7 C)  TempSrc: Oral Oral    SpO2: 99% 99% 99% 96%  Weight:      Height:       General appearance: Awake alert.  In no distress Resp: Clear to auscultation bilaterally.  Normal effort Cardio: S1-S2 is normal regular.  No S3-S4.  No rubs murmurs or bruit GI: Abdomen is soft.  Nontender nondistended.  Bowel sounds are present normal.  No masses organomegaly    DISPOSITION: Home  Discharge Instructions     Call MD for:  difficulty breathing, headache or visual disturbances   Complete by: As directed    Call MD for:  extreme  fatigue   Complete by: As directed    Call MD for:  persistant dizziness or light-headedness   Complete by: As directed    Call MD for:  persistant nausea and vomiting   Complete by: As directed    Call MD for:  severe uncontrolled pain   Complete by: As directed    Call MD for:  temperature >100.4   Complete by: As directed    Diet - low sodium heart healthy   Complete by: As directed    Discharge instructions   Complete by: As directed    Please be sure to follow-up with your primary care provider.  Please stop drinking alcohol going forward.  You were cared for by a hospitalist during your hospital stay. If you have any questions about your discharge medications or the care you received while you were in the hospital  after you are discharged, you can call the unit and asked to speak with the hospitalist on call if the hospitalist that took care of you is not available. Once you are discharged, your primary care physician will handle any further medical issues. Please note that NO REFILLS for any discharge medications will be authorized once you are discharged, as it is imperative that you return to your primary care physician (or establish a relationship with a primary care physician if you do not have one) for your aftercare needs so that they can reassess your need for medications and monitor your lab values. If you do not have a primary care physician, you can call [phone] for a physician referral.   Increase activity slowly   Complete by: As directed        Allergies as of 03/01/2023   No Known Allergies      Medication List     TAKE these medications    cloNIDine 0.1 MG tablet Commonly known as: CATAPRES Take 1 tablet (0.1 mg total) by mouth 2 (two) times daily.   finasteride 5 MG tablet Commonly known as: PROSCAR Take 5 mg by mouth daily.   folic acid 1 MG tablet Commonly known as: FOLVITE Take 1 tablet (1 mg total) by mouth daily.   gabapentin 300 MG  capsule Commonly known as: NEURONTIN Take 300 mg by mouth at bedtime.   LORazepam 0.5 MG tablet Commonly known as: Ativan Take 1 tablet (0.5 mg total) by mouth every 8 (eight) hours as needed for up to 5 days for anxiety.   metoprolol tartrate 50 MG tablet Commonly known as: LOPRESSOR Take 50 mg by mouth 2 (two) times daily.   Milk of Magnesia 2400 MG/30ML suspension Generic drug: magnesium hydroxide Take 30 mLs by mouth every other day.   QUEtiapine 300 MG tablet Commonly known as: SEROQUEL Take 300 mg by mouth at bedtime.   rosuvastatin 20 MG tablet Commonly known as: CRESTOR Take 20 mg by mouth every Monday, Wednesday, and Friday.   senna-docusate 8.6-50 MG tablet Commonly known as: Senokot-S Take 1 tablet by mouth daily as needed for mild constipation.   tamsulosin 0.4 MG Caps capsule Commonly known as: FLOMAX Take 0.4 mg by mouth at bedtime.   thiamine 100 MG tablet Commonly known as: Vitamin B-1 Take 1 tablet (100 mg total) by mouth daily.           TOTAL DISCHARGE TIME: 35 minutes  Melesio Mad[employer] on [email or web address]  03/02/2023, 10:54 AM

## 2023-03-05 DIAGNOSIS — K219 Gastro-esophageal reflux disease without esophagitis: Secondary | ICD-10-CM | POA: Diagnosis not present

## 2023-03-05 DIAGNOSIS — N1831 Chronic kidney disease, stage 3a: Secondary | ICD-10-CM | POA: Diagnosis not present

## 2023-03-05 DIAGNOSIS — I10 Essential (primary) hypertension: Secondary | ICD-10-CM | POA: Diagnosis not present

## 2023-03-05 DIAGNOSIS — I700 Atherosclerosis of aorta: Secondary | ICD-10-CM | POA: Diagnosis not present

## 2023-03-05 DIAGNOSIS — F5101 Primary insomnia: Secondary | ICD-10-CM | POA: Diagnosis not present

## 2023-03-05 DIAGNOSIS — F1021 Alcohol dependence, in remission: Secondary | ICD-10-CM | POA: Diagnosis not present

## 2023-03-05 DIAGNOSIS — R7303 Prediabetes: Secondary | ICD-10-CM | POA: Diagnosis not present

## 2023-03-05 DIAGNOSIS — Z09 Encounter for follow-up examination after completed treatment for conditions other than malignant neoplasm: Secondary | ICD-10-CM | POA: Diagnosis not present

## 2023-03-05 DIAGNOSIS — F3341 Major depressive disorder, recurrent, in partial remission: Secondary | ICD-10-CM | POA: Diagnosis not present

## 2023-03-06 ENCOUNTER — Other Ambulatory Visit: Payer: Self-pay | Admitting: Urology

## 2023-03-14 DIAGNOSIS — K219 Gastro-esophageal reflux disease without esophagitis: Secondary | ICD-10-CM | POA: Diagnosis not present

## 2023-03-14 DIAGNOSIS — F1021 Alcohol dependence, in remission: Secondary | ICD-10-CM | POA: Diagnosis not present

## 2023-03-14 DIAGNOSIS — I700 Atherosclerosis of aorta: Secondary | ICD-10-CM | POA: Diagnosis not present

## 2023-03-14 DIAGNOSIS — Z09 Encounter for follow-up examination after completed treatment for conditions other than malignant neoplasm: Secondary | ICD-10-CM | POA: Diagnosis not present

## 2023-03-14 DIAGNOSIS — I10 Essential (primary) hypertension: Secondary | ICD-10-CM | POA: Diagnosis not present

## 2023-03-14 DIAGNOSIS — R7303 Prediabetes: Secondary | ICD-10-CM | POA: Diagnosis not present

## 2023-03-14 DIAGNOSIS — N1831 Chronic kidney disease, stage 3a: Secondary | ICD-10-CM | POA: Diagnosis not present

## 2023-03-15 ENCOUNTER — Ambulatory Visit
Admission: RE | Admit: 2023-03-15 | Discharge: 2023-03-15 | Disposition: A | Discharge status: Home / Self Care | Payer: Medicare HMO | Source: Ambulatory Visit | Attending: Gastroenterology | Admitting: Gastroenterology

## 2023-03-15 DIAGNOSIS — K224 Dyskinesia of esophagus: Secondary | ICD-10-CM | POA: Diagnosis not present

## 2023-03-15 DIAGNOSIS — K219 Gastro-esophageal reflux disease without esophagitis: Secondary | ICD-10-CM | POA: Diagnosis not present

## 2023-03-15 DIAGNOSIS — R1310 Dysphagia, unspecified: Secondary | ICD-10-CM | POA: Diagnosis not present

## 2023-03-19 NOTE — Progress Notes (Addendum)
PCP - Peri Maris, FNP Cardiologist - Patwardhan, Manish LOV 05-11-22 epic Doristine Church, MD lov 01-25-23  PPM/ICD -  Device Orders -  Rep Notified -   Chest x-ray - 02-26-23 epic EKG - 02-26-23 epic Stress Test - Stress 04-25-22 epic ECHO - 04-30-22 epic Cardiac Cath - 05-16-22 epic CBC, CMP- 03-01-23 epic  Sleep Study -  CPAP - y  Fasting Blood Sugar -  Checks Blood Sugar _____ times a day  Blood Thinner Instructions: Aspirin Instructions:  ERAS Protcol - PRE-SURGERY Ensure or G2-    COVID vaccine -  Activity-- Anesthesia review: ETOH abuse , HTN, aortic atherosclerosis, hospitalized for ETOH withdrawal 02-26-23 , OSA, pre DM  Patient denies shortness of breath, fever, cough and chest pain at PAT appointment   All instructions explained to the patient, with a verbal understanding of the material. Patient agrees to go over the instructions while at home for a better understanding. Patient also instructed to self quarantine after being tested for COVID-19. The opportunity to ask questions was provided.

## 2023-03-19 NOTE — Patient Instructions (Signed)
SURGICAL WAITING ROOM VISITATION  Patients having surgery or a procedure may have no more than 2 support people in the waiting area - these visitors may rotate.    Children under the age of 24 must have an adult with them who is not the patient.  If the patient needs to stay at the hospital during part of their recovery, the visitor guidelines for inpatient rooms apply. Pre-op nurse will coordinate an appropriate time for 1 support person to accompany patient in pre-op.  This support person may not rotate.    Please refer to the [facility] website for the visitor guidelines for Inpatients (after your surgery is over and you are in a regular room).       Your procedure is scheduled on: 03-27-23   Report to [facility] Main Entrance    Report to admitting at      11:15 AM   Call this number if you have problems the morning of surgery [phone]   Do not eat food :After Midnight.   After Midnight you may have the following liquids until _0730_____ AM/ D AY OF SURGERY  then nothing by mouth  Water Non-Citrus Juices (without pulp, NO RED-Apple, White grape, White cranberry) Black Coffee (NO MILK/CREAM OR CREAMERS, sugar ok)  Clear Tea (NO MILK/CREAM OR CREAMERS, sugar ok) regular and decaf                             Plain Jell-O (NO RED)                                           Fruit ices (not with fruit pulp, NO RED)                                     Popsicles (NO RED)                                                               Sports drinks like Gatorade (NO RED)                            If you have questions, please contact your surgeon's office.   FOLLOW  ANY ADDITIONAL PRE OP INSTRUCTIONS YOU RECEIVED FROM YOUR SURGEON'S OFFICE!!!     Oral Hygiene is also important to reduce your risk of infection.                                    Remember - BRUSH YOUR TEETH THE MORNING OF SURGERY WITH YOUR REGULAR TOOTHPASTE  DENTURES WILL BE REMOVED PRIOR TO  SURGERY PLEASE DO NOT APPLY "Poly grip" OR ADHESIVES!!!   Do NOT smoke after Midnight   Stop all vitamins and herbal supplements 7 days before surgery.   Take these medicines the morning of surgery with A SIP OF WATER: metoprolol, finasteride, metoprolol    Bring CPAP mask and tubing day of surgery.  You may not have any metal on your body including hair pins, jewelry, and body piercing             Do not wear , lotions, powders, perfumes/cologne, or deodorant                Men may shave face and neck.   Do not bring valuables to the hospital. [facility] IS NOT             RESPONSIBLE   FOR VALUABLES.   Contacts, glasses, dentures or bridgework may not be worn into surgery.   Bring small overnight bag day of surgery.   DO NOT BRING YOUR HOME MEDICATIONS TO THE HOSPITAL. PHARMACY WILL DISPENSE MEDICATIONS LISTED ON YOUR MEDICATION LIST TO YOU DURING YOUR ADMISSION IN THE HOSPITAL!    Patients discharged on the day of surgery will not be allowed to drive home.  Someone NEEDS to stay with you for the first 24 hours after anesthesia.   Special Instructions: Bring a copy of your healthcare power of attorney and living will documents the day of surgery if you haven't scanned them before.              Please read over the following fact sheets you were given: IF YOU HAVE QUESTIONS ABOUT YOUR PRE-OP INSTRUCTIONS PLEASE CALL [phone]    If you test positive for Covid or have been in contact with anyone that has tested positive in the last 10 days please notify you surgeon.    [facility] - Preparing for Surgery Before surgery, you can play an important role.  Because skin is not sterile, your skin needs to be as free of germs as possible.  You can reduce the number of germs on your skin by washing with CHG (chlorahexidine gluconate) soap before surgery.  CHG is an antiseptic cleaner which kills germs and bonds with the skin to continue killing germs  even after washing. Please DO NOT use if you have an allergy to CHG or antibacterial soaps.  If your skin becomes reddened/irritated stop using the CHG and inform your nurse when you arrive at Short Stay. Do not shave (including legs and underarms) for at least 48 hours prior to the first CHG shower.  You may shave your face/neck. Please follow these instructions carefully:  1.  Shower with CHG Soap the night before surgery and the  morning of Surgery.  2.  If you choose to wash your hair, wash your hair first as usual with your  normal  shampoo.  3.  After you shampoo, rinse your hair and body thoroughly to remove the  shampoo.                           4.  Use CHG as you would any other liquid soap.  You can apply chg directly  to the skin and wash                       Gently with a scrungie or clean washcloth.  5.  Apply the CHG Soap to your body ONLY FROM THE NECK DOWN.   Do not use on face/ open                           Wound or open sores. Avoid contact with eyes, ears mouth and genitals (private parts).  Wash face,  Genitals (private parts) with your normal soap.             6.  Wash thoroughly, paying special attention to the area where your surgery  will be performed.  7.  Thoroughly rinse your body with warm water from the neck down.  8.  DO NOT shower/wash with your normal soap after using and rinsing off  the CHG Soap.                9.  Pat yourself dry with a clean towel.            10.  Wear clean pajamas.            11.  Place clean sheets on your bed the night of your first shower and do not  sleep with pets. Day of Surgery : Do not apply any lotions/deodorants the morning of surgery.  Please wear clean clothes to the hospital/surgery center.  FAILURE TO FOLLOW THESE INSTRUCTIONS MAY RESULT IN THE CANCELLATION OF YOUR SURGERY PATIENT SIGNATURE_________________________________  NURSE  SIGNATURE__________________________________  ________________________________________________________________________

## 2023-03-21 ENCOUNTER — Encounter (HOSPITAL_COMMUNITY)
Admission: RE | Admit: 2023-03-21 | Discharge: 2023-03-21 | Disposition: A | Discharge status: Home / Self Care | Payer: Medicare HMO | Source: Ambulatory Visit | Attending: Family Medicine | Admitting: Family Medicine

## 2023-03-22 ENCOUNTER — Inpatient Hospital Stay (HOSPITAL_COMMUNITY)
Admission: EM | Admit: 2023-03-22 | Discharge: 2023-03-25 | DRG: 897 | Disposition: A | Discharge status: Home / Self Care | Payer: Medicare HMO | Attending: Student | Admitting: Student

## 2023-03-22 ENCOUNTER — Other Ambulatory Visit: Payer: Self-pay

## 2023-03-22 ENCOUNTER — Encounter (HOSPITAL_COMMUNITY): Payer: Self-pay

## 2023-03-22 DIAGNOSIS — Z6829 Body mass index (BMI) 29.0-29.9, adult: Secondary | ICD-10-CM

## 2023-03-22 DIAGNOSIS — R7989 Other specified abnormal findings of blood chemistry: Secondary | ICD-10-CM | POA: Diagnosis present

## 2023-03-22 DIAGNOSIS — F10231 Alcohol dependence with withdrawal delirium: Secondary | ICD-10-CM | POA: Diagnosis not present

## 2023-03-22 DIAGNOSIS — E669 Obesity, unspecified: Secondary | ICD-10-CM | POA: Diagnosis not present

## 2023-03-22 DIAGNOSIS — Y908 Blood alcohol level of 240 mg/100 ml or more: Secondary | ICD-10-CM | POA: Diagnosis present

## 2023-03-22 DIAGNOSIS — Z8711 Personal history of peptic ulcer disease: Secondary | ICD-10-CM

## 2023-03-22 DIAGNOSIS — E872 Acidosis, unspecified: Secondary | ICD-10-CM | POA: Diagnosis not present

## 2023-03-22 DIAGNOSIS — R410 Disorientation, unspecified: Secondary | ICD-10-CM | POA: Diagnosis not present

## 2023-03-22 DIAGNOSIS — Z781 Physical restraint status: Secondary | ICD-10-CM | POA: Diagnosis not present

## 2023-03-22 DIAGNOSIS — E871 Hypo-osmolality and hyponatremia: Secondary | ICD-10-CM | POA: Diagnosis not present

## 2023-03-22 DIAGNOSIS — I10 Essential (primary) hypertension: Secondary | ICD-10-CM | POA: Diagnosis not present

## 2023-03-22 DIAGNOSIS — Z79899 Other long term (current) drug therapy: Secondary | ICD-10-CM | POA: Diagnosis not present

## 2023-03-22 DIAGNOSIS — I700 Atherosclerosis of aorta: Secondary | ICD-10-CM | POA: Diagnosis present

## 2023-03-22 DIAGNOSIS — F1020 Alcohol dependence, uncomplicated: Secondary | ICD-10-CM | POA: Diagnosis present

## 2023-03-22 DIAGNOSIS — E860 Dehydration: Secondary | ICD-10-CM | POA: Diagnosis not present

## 2023-03-22 DIAGNOSIS — R17 Unspecified jaundice: Secondary | ICD-10-CM | POA: Diagnosis present

## 2023-03-22 DIAGNOSIS — R000 Tachycardia, unspecified: Secondary | ICD-10-CM | POA: Diagnosis present

## 2023-03-22 DIAGNOSIS — Z823 Family history of stroke: Secondary | ICD-10-CM

## 2023-03-22 DIAGNOSIS — F10931 Alcohol use, unspecified with withdrawal delirium: Secondary | ICD-10-CM | POA: Diagnosis present

## 2023-03-22 DIAGNOSIS — R7303 Prediabetes: Secondary | ICD-10-CM | POA: Diagnosis present

## 2023-03-22 DIAGNOSIS — E876 Hypokalemia: Secondary | ICD-10-CM | POA: Diagnosis not present

## 2023-03-22 DIAGNOSIS — K219 Gastro-esophageal reflux disease without esophagitis: Secondary | ICD-10-CM | POA: Diagnosis present

## 2023-03-22 DIAGNOSIS — E7800 Pure hypercholesterolemia, unspecified: Secondary | ICD-10-CM | POA: Diagnosis present

## 2023-03-22 DIAGNOSIS — F10229 Alcohol dependence with intoxication, unspecified: Principal | ICD-10-CM | POA: Diagnosis present

## 2023-03-22 DIAGNOSIS — E8729 Other acidosis: Principal | ICD-10-CM

## 2023-03-22 DIAGNOSIS — F10921 Alcohol use, unspecified with intoxication delirium: Secondary | ICD-10-CM | POA: Diagnosis not present

## 2023-03-22 DIAGNOSIS — G629 Polyneuropathy, unspecified: Secondary | ICD-10-CM | POA: Diagnosis present

## 2023-03-22 DIAGNOSIS — K5909 Other constipation: Secondary | ICD-10-CM | POA: Diagnosis present

## 2023-03-22 DIAGNOSIS — Z860100 Personal history of colon polyps, unspecified: Secondary | ICD-10-CM

## 2023-03-22 DIAGNOSIS — Z9181 History of falling: Secondary | ICD-10-CM

## 2023-03-22 DIAGNOSIS — N179 Acute kidney failure, unspecified: Secondary | ICD-10-CM | POA: Diagnosis present

## 2023-03-22 DIAGNOSIS — F32A Depression, unspecified: Secondary | ICD-10-CM | POA: Diagnosis present

## 2023-03-22 DIAGNOSIS — Z9079 Acquired absence of other genital organ(s): Secondary | ICD-10-CM

## 2023-03-22 DIAGNOSIS — R112 Nausea with vomiting, unspecified: Secondary | ICD-10-CM | POA: Diagnosis not present

## 2023-03-22 DIAGNOSIS — R1110 Vomiting, unspecified: Secondary | ICD-10-CM | POA: Diagnosis present

## 2023-03-22 DIAGNOSIS — N400 Enlarged prostate without lower urinary tract symptoms: Secondary | ICD-10-CM | POA: Diagnosis not present

## 2023-03-22 DIAGNOSIS — F10932 Alcohol use, unspecified with withdrawal with perceptual disturbance: Secondary | ICD-10-CM | POA: Insufficient documentation

## 2023-03-22 DIAGNOSIS — F10929 Alcohol use, unspecified with intoxication, unspecified: Secondary | ICD-10-CM | POA: Diagnosis not present

## 2023-03-22 DIAGNOSIS — E785 Hyperlipidemia, unspecified: Secondary | ICD-10-CM | POA: Diagnosis present

## 2023-03-22 DIAGNOSIS — Z8249 Family history of ischemic heart disease and other diseases of the circulatory system: Secondary | ICD-10-CM

## 2023-03-22 DIAGNOSIS — Z803 Family history of malignant neoplasm of breast: Secondary | ICD-10-CM

## 2023-03-22 LAB — COMPREHENSIVE METABOLIC PANEL
ALT: 46 U/L — ABNORMAL HIGH (ref 0–44)
AST: 43 U/L — ABNORMAL HIGH (ref 15–41)
Albumin: 4.1 g/dL (ref 3.5–5.0)
Alkaline Phosphatase: 75 U/L (ref 38–126)
Anion gap: >20 — ABNORMAL HIGH (ref 5–15)
BUN: 15 mg/dL (ref 8–23)
CO2: 12 mmol/L — ABNORMAL LOW (ref 22–32)
Calcium: 8.6 mg/dL — ABNORMAL LOW (ref 8.9–10.3)
Chloride: 101 mmol/L (ref 98–111)
Creatinine, Ser: 1.45 mg/dL — ABNORMAL HIGH (ref 0.61–1.24)
GFR, Estimated: 52 mL/min — ABNORMAL LOW (ref >60)
Glucose, Bld: 92 mg/dL (ref 70–99)
Potassium: 4.3 mmol/L (ref 3.5–5.1)
Sodium: 139 mmol/L (ref 135–145)
Total Bilirubin: 0.7 mg/dL (ref <1.2)
Total Protein: 7.7 g/dL (ref 6.5–8.1)

## 2023-03-22 LAB — CBC
HCT: 50.9 % (ref 39.0–52.0)
Hemoglobin: 16.9 g/dL (ref 13.0–17.0)
MCH: 30.5 pg (ref 26.0–34.0)
MCHC: 33.2 g/dL (ref 30.0–36.0)
MCV: 91.7 fL (ref 80.0–100.0)
Platelets: 263 10*3/uL (ref 150–400)
RBC: 5.55 MIL/uL (ref 4.22–5.81)
RDW: 16.2 % — ABNORMAL HIGH (ref 11.5–15.5)
WBC: 13.7 10*3/uL — ABNORMAL HIGH (ref 4.0–10.5)
nRBC: 0 % (ref 0.0–0.2)

## 2023-03-22 LAB — TROPONIN I (HIGH SENSITIVITY)
Troponin I (High Sensitivity): 16 ng/L (ref <18)
Troponin I (High Sensitivity): 18 ng/L — ABNORMAL HIGH (ref <18)

## 2023-03-22 LAB — BASIC METABOLIC PANEL
Anion gap: 9 (ref 5–15)
BUN: 11 mg/dL (ref 8–23)
CO2: 24 mmol/L (ref 22–32)
Calcium: 7.9 mg/dL — ABNORMAL LOW (ref 8.9–10.3)
Chloride: 102 mmol/L (ref 98–111)
Creatinine, Ser: 1.08 mg/dL (ref 0.61–1.24)
GFR, Estimated: >60 mL/min (ref >60)
Glucose, Bld: 118 mg/dL — ABNORMAL HIGH (ref 70–99)
Potassium: 4.9 mmol/L (ref 3.5–5.1)
Sodium: 135 mmol/L (ref 135–145)

## 2023-03-22 LAB — GLUCOSE, CAPILLARY: Glucose-Capillary: 117 mg/dL — ABNORMAL HIGH (ref 70–99)

## 2023-03-22 LAB — MRSA NEXT GEN BY PCR, NASAL: MRSA by PCR Next Gen: NOT DETECTED

## 2023-03-22 LAB — BLOOD GAS, VENOUS
Acid-base deficit: 9.3 mmol/L — ABNORMAL HIGH (ref 0.0–2.0)
Bicarbonate: 17.1 mmol/L — ABNORMAL LOW (ref 20.0–28.0)
O2 Saturation: 85.3 %
Patient temperature: 37
pCO2, Ven: 38 mm[Hg] — ABNORMAL LOW (ref 44–60)
pH, Ven: 7.26 (ref 7.25–7.43)
pO2, Ven: 55 mm[Hg] — ABNORMAL HIGH (ref 32–45)

## 2023-03-22 LAB — LIPASE, BLOOD: Lipase: 26 U/L (ref 11–51)

## 2023-03-22 LAB — MAGNESIUM: Magnesium: 2.4 mg/dL (ref 1.7–2.4)

## 2023-03-22 LAB — ETHANOL: Alcohol, Ethyl (B): 267 mg/dL — ABNORMAL HIGH (ref <10)

## 2023-03-22 MED ORDER — TAMSULOSIN HCL 0.4 MG PO CAPS
0.4000 mg | ORAL_CAPSULE | Freq: Every day | ORAL | Status: DC
  Administered 2023-03-22 – 2023-03-24 (×3): 0.4 mg via ORAL
  Filled 2023-03-22 (×3): qty 1

## 2023-03-22 MED ORDER — PANTOPRAZOLE SODIUM 40 MG PO TBEC
40.0000 mg | DELAYED_RELEASE_TABLET | Freq: Every day | ORAL | Status: DC
  Administered 2023-03-23 – 2023-03-25 (×3): 40 mg via ORAL
  Filled 2023-03-22 (×3): qty 1

## 2023-03-22 MED ORDER — CHLORHEXIDINE GLUCONATE CLOTH 2 % EX PADS
6.0000 | MEDICATED_PAD | Freq: Every day | CUTANEOUS | Status: DC
  Administered 2023-03-22 – 2023-03-23 (×2): 6 via TOPICAL

## 2023-03-22 MED ORDER — FOLIC ACID 1 MG PO TABS
1.0000 mg | ORAL_TABLET | Freq: Every day | ORAL | Status: DC
  Administered 2023-03-22 – 2023-03-25 (×4): 1 mg via ORAL
  Filled 2023-03-22 (×4): qty 1

## 2023-03-22 MED ORDER — LORAZEPAM 1 MG PO TABS
0.0000 mg | ORAL_TABLET | ORAL | Status: DC
  Administered 2023-03-22 (×3): 1 mg via ORAL
  Administered 2023-03-22: 2 mg via ORAL
  Administered 2023-03-23: 1 mg via ORAL
  Filled 2023-03-22: qty 2
  Filled 2023-03-22 (×4): qty 1

## 2023-03-22 MED ORDER — DEXTROSE 50 % IV SOLN
25.0000 g | Freq: Once | INTRAVENOUS | Status: AC
  Administered 2023-03-22: 25 g via INTRAVENOUS
  Filled 2023-03-22: qty 50

## 2023-03-22 MED ORDER — THIAMINE HCL 100 MG/ML IJ SOLN
100.0000 mg | Freq: Every day | INTRAMUSCULAR | Status: DC
  Administered 2023-03-22: 100 mg via INTRAVENOUS
  Filled 2023-03-22 (×2): qty 2

## 2023-03-22 MED ORDER — METOPROLOL TARTRATE 5 MG/5ML IV SOLN
5.0000 mg | Freq: Once | INTRAVENOUS | Status: AC
  Administered 2023-03-22: 5 mg via INTRAVENOUS
  Filled 2023-03-22: qty 5

## 2023-03-22 MED ORDER — ACETAMINOPHEN 650 MG RE SUPP
650.0000 mg | Freq: Four times a day (QID) | RECTAL | Status: AC | PRN
Start: 2023-03-22 — End: ?

## 2023-03-22 MED ORDER — LORAZEPAM 1 MG PO TABS: 0.0000 mg | ORAL_TABLET | Freq: Three times a day (TID) | ORAL | Status: DC

## 2023-03-22 MED ORDER — LORAZEPAM 2 MG/ML IJ SOLN
1.0000 mg | INTRAMUSCULAR | Status: AC | PRN
  Administered 2023-03-22: 1 mg via INTRAVENOUS
  Administered 2023-03-23: 3 mg via INTRAVENOUS
  Filled 2023-03-22: qty 2
  Filled 2023-03-22: qty 1

## 2023-03-22 MED ORDER — PROCHLORPERAZINE EDISYLATE 10 MG/2ML IJ SOLN
10.0000 mg | Freq: Once | INTRAMUSCULAR | Status: AC
  Administered 2023-03-22: 10 mg via INTRAVENOUS
  Filled 2023-03-22: qty 2

## 2023-03-22 MED ORDER — LORAZEPAM 1 MG PO TABS: 1.0000 mg | ORAL_TABLET | ORAL | Status: DC | PRN

## 2023-03-22 MED ORDER — METOPROLOL TARTRATE 50 MG PO TABS
50.0000 mg | ORAL_TABLET | Freq: Two times a day (BID) | ORAL | Status: DC
  Administered 2023-03-22 – 2023-03-25 (×6): 50 mg via ORAL
  Filled 2023-03-22 (×3): qty 1
  Filled 2023-03-22 (×2): qty 2
  Filled 2023-03-22: qty 1

## 2023-03-22 MED ORDER — PANTOPRAZOLE SODIUM 40 MG IV SOLR
40.0000 mg | Freq: Once | INTRAVENOUS | Status: AC
  Administered 2023-03-22: 40 mg via INTRAVENOUS
  Filled 2023-03-22: qty 10

## 2023-03-22 MED ORDER — LORAZEPAM 2 MG/ML IJ SOLN: 1.0000 mg | INTRAMUSCULAR | Status: DC | PRN

## 2023-03-22 MED ORDER — ONDANSETRON HCL 4 MG/2ML IJ SOLN
4.0000 mg | Freq: Once | INTRAMUSCULAR | Status: AC
  Administered 2023-03-22: 4 mg via INTRAVENOUS
  Filled 2023-03-22: qty 2

## 2023-03-22 MED ORDER — ONDANSETRON HCL 4 MG PO TABS: 4.0000 mg | ORAL_TABLET | Freq: Four times a day (QID) | ORAL | Status: DC | PRN

## 2023-03-22 MED ORDER — THIAMINE MONONITRATE 100 MG PO TABS
100.0000 mg | ORAL_TABLET | Freq: Every day | ORAL | Status: DC
  Administered 2023-03-23 – 2023-03-25 (×3): 100 mg via ORAL
  Filled 2023-03-22 (×3): qty 1

## 2023-03-22 MED ORDER — DEXTROSE IN LACTATED RINGERS 5 % IV SOLN: INTRAVENOUS | Status: AC

## 2023-03-22 MED ORDER — LACTATED RINGERS IV BOLUS
1000.0000 mL | Freq: Once | INTRAVENOUS | Status: AC
  Administered 2023-03-22: 1000 mL via INTRAVENOUS

## 2023-03-22 MED ORDER — GABAPENTIN 300 MG PO CAPS
300.0000 mg | ORAL_CAPSULE | Freq: Two times a day (BID) | ORAL | Status: DC
  Administered 2023-03-22 – 2023-03-25 (×6): 300 mg via ORAL
  Filled 2023-03-22 (×6): qty 1

## 2023-03-22 MED ORDER — PHENOBARBITAL 32.4 MG PO TABS
32.4000 mg | ORAL_TABLET | Freq: Two times a day (BID) | ORAL | Status: AC
  Administered 2023-03-23 (×2): 32.4 mg via ORAL
  Filled 2023-03-22 (×2): qty 1

## 2023-03-22 MED ORDER — ACETAMINOPHEN 325 MG PO TABS: 650.0000 mg | ORAL_TABLET | Freq: Four times a day (QID) | ORAL | Status: DC | PRN

## 2023-03-22 MED ORDER — LORAZEPAM 1 MG PO TABS
1.0000 mg | ORAL_TABLET | ORAL | Status: AC | PRN
Start: 2023-03-22 — End: 2023-03-25
  Administered 2023-03-23 (×2): 1 mg via ORAL
  Administered 2023-03-23: 2 mg via ORAL
  Filled 2023-03-22 (×4): qty 1

## 2023-03-22 MED ORDER — DEXTROSE 5 % IV BOLUS
1000.0000 mL | Freq: Once | INTRAVENOUS | Status: AC
  Administered 2023-03-22: 1000 mL via INTRAVENOUS

## 2023-03-22 MED ORDER — INSULIN ASPART 100 UNIT/ML IJ SOLN
5.0000 [IU] | Freq: Once | INTRAMUSCULAR | Status: AC
  Administered 2023-03-22: 5 [IU] via SUBCUTANEOUS
  Filled 2023-03-22: qty 0.05

## 2023-03-22 MED ORDER — FINASTERIDE 5 MG PO TABS
5.0000 mg | ORAL_TABLET | Freq: Every day | ORAL | Status: DC
  Administered 2023-03-22 – 2023-03-25 (×4): 5 mg via ORAL
  Filled 2023-03-22 (×4): qty 1

## 2023-03-22 MED ORDER — QUETIAPINE FUMARATE 200 MG PO TABS
300.0000 mg | ORAL_TABLET | Freq: Every day | ORAL | Status: DC
  Administered 2023-03-22 – 2023-03-24 (×3): 300 mg via ORAL
  Filled 2023-03-22: qty 3
  Filled 2023-03-22 (×2): qty 1

## 2023-03-22 MED ORDER — ORAL CARE MOUTH RINSE: 15.0000 mL | OROMUCOSAL | Status: DC | PRN

## 2023-03-22 MED ORDER — PHENOBARBITAL 32.4 MG PO TABS
16.2000 mg | ORAL_TABLET | Freq: Two times a day (BID) | ORAL | Status: DC
  Administered 2023-03-24 – 2023-03-25 (×2): 16.2 mg via ORAL
  Filled 2023-03-22 (×3): qty 1

## 2023-03-22 MED ORDER — MAGNESIUM SULFATE 2 GM/50ML IV SOLN
2.0000 g | Freq: Once | INTRAVENOUS | Status: AC
Start: 2023-03-22 — End: 2023-03-22
  Administered 2023-03-22: 2 g via INTRAVENOUS
  Filled 2023-03-22: qty 50

## 2023-03-22 MED ORDER — ONDANSETRON HCL 4 MG/2ML IJ SOLN: 4.0000 mg | Freq: Four times a day (QID) | INTRAMUSCULAR | Status: DC | PRN

## 2023-03-22 MED ORDER — PHENOBARBITAL 32.4 MG PO TABS
64.8000 mg | ORAL_TABLET | Freq: Two times a day (BID) | ORAL | Status: AC
  Administered 2023-03-22 (×2): 64.8 mg via ORAL
  Filled 2023-03-22: qty 2
  Filled 2023-03-22: qty 1

## 2023-03-22 MED ORDER — ENOXAPARIN SODIUM 40 MG/0.4ML IJ SOSY
40.0000 mg | PREFILLED_SYRINGE | INTRAMUSCULAR | Status: DC
  Administered 2023-03-22 – 2023-03-24 (×3): 40 mg via SUBCUTANEOUS
  Filled 2023-03-22 (×3): qty 0.4

## 2023-03-22 MED ORDER — ADULT MULTIVITAMIN W/MINERALS CH
1.0000 | ORAL_TABLET | Freq: Every day | ORAL | Status: DC
Start: 2023-03-22 — End: 2023-03-25
  Administered 2023-03-22 – 2023-03-25 (×4): 1 via ORAL
  Filled 2023-03-22 (×4): qty 1

## 2023-03-22 MED ORDER — LORAZEPAM 2 MG/ML IJ SOLN
2.0000 mg | Freq: Once | INTRAMUSCULAR | Status: AC
  Administered 2023-03-22: 2 mg via INTRAVENOUS
  Filled 2023-03-22: qty 1

## 2023-03-22 NOTE — ED Triage Notes (Signed)
Pt BIB [facility] for intoxication and vomiting. Pt states that he drinks a fifth of vodka daily and drank more last night. Pt's HR is elevated during triage and he does report feeling palpitations and doesn't remember if he has a cardiac history. He denies chest pain

## 2023-03-22 NOTE — ED Provider Notes (Signed)
[place] [facility]ERGENCY DEPARTMENT AT [facility] Provider Note   CSN: [id number] Arrival date & time: 03/22/23  9147     History  Chief Complaint  Patient presents with   Emesis    Ruben [name] is a 72 y.o. male with past medical history of alcohol abuse, AKI, alcohol withdrawal delirium, hypertension, GERD, prediabetes reporting to emergency room with intoxication.  Patient reports he has been drinking heavily he is unsure of how much.  Patient normally drinks up a pint - fifth daily. Patient is complaining of significant nausea, vomiting and generalized abdominal pain.  Denies any chest pain, shortness of breath, focal weakness. Denies auditory hallucinations, visual hallucinations.   Emesis      Home Medications Prior to Admission medications   Medication Sig Start Date End Date Taking? Authorizing Provider  cloNIDine (CATAPRES) 0.1 MG tablet Take 1 tablet (0.1 mg total) by mouth 2 (two) times daily. Patient taking differently: Take 0.1 mg by mouth at bedtime. 02/04/23   Almon Hercules, MD  desvenlafaxine (PRISTIQ) 50 MG 24 hr tablet Take 50 mg by mouth daily. 03/05/23   [provider]  finasteride (PROSCAR) 5 MG tablet Take 5 mg by mouth daily.    [provider]  folic acid (FOLVITE) 1 MG tablet Take 1 tablet (1 mg total) by mouth daily. 03/01/23   Osvaldo Shipper, MD  gabapentin (NEURONTIN) 300 MG capsule Take 300 mg by mouth 2 (two) times daily.    [provider]  hydrOXYzine (ATARAX) 50 MG tablet Take 50 mg by mouth daily.    [provider]  LORazepam (ATIVAN) 0.5 MG tablet Take 0.5 mg by mouth every 8 (eight) hours as needed for anxiety.    [provider]  magnesium hydroxide (MILK OF MAGNESIA) 400 MG/5ML suspension Take 30 mLs by mouth every other day.    [provider]  metoprolol tartrate (LOPRESSOR) 50 MG tablet Take 50 mg by mouth 2 (two) times daily.    [provider]  QUEtiapine  (SEROQUEL) 300 MG tablet Take 300 mg by mouth at bedtime. 12/02/22   [provider]  rosuvastatin (CRESTOR) 20 MG tablet Take 20 mg by mouth every Monday, Wednesday, and Friday. 08/23/21   [provider]  tamsulosin (FLOMAX) 0.4 MG CAPS capsule Take 0.4 mg by mouth at bedtime.    [provider]      Allergies    Patient has no known allergies.    Review of Systems   Review of Systems  Gastrointestinal:  Positive for vomiting.    Physical Exam Updated Vital Signs BP 133/76   Pulse (!) 136   Temp 98 F (36.7 C) (Oral)   Resp 13   SpO2 95%  Physical Exam Vitals and nursing note reviewed.  Constitutional:      General: He is not in acute distress.    Appearance: He is ill-appearing. He is not toxic-appearing.     Comments: Patient is alert, oriented answering questions appropriately.  HENT:     Head: Normocephalic and atraumatic.  Eyes:     General: No scleral icterus.    Conjunctiva/sclera: Conjunctivae normal.  Cardiovascular:     Rate and Rhythm: Regular rhythm. Tachycardia present.     Pulses: Normal pulses.     Heart sounds: Normal heart sounds.  Pulmonary:     Effort: Pulmonary effort is normal. No respiratory distress.     Breath sounds: Normal breath sounds.  Abdominal:     General:  Abdomen is flat. Bowel sounds are normal. There is no distension.     Palpations: Abdomen is soft. There is no mass.     Tenderness: There is no abdominal tenderness.     Hernia: No hernia is present.  Musculoskeletal:     Right lower leg: No edema.     Left lower leg: No edema.  Skin:    General: Skin is warm and dry.     Capillary Refill: Capillary refill takes less than 2 seconds.     Findings: No lesion.  Neurological:     General: No focal deficit present.     Mental Status: He is alert and oriented to person, place, and time. Mental status is at baseline.     Comments: Lower extremity strength equal bilaterally.  No change in sensation. Upper  extremity strength equal bilaterally, no change in sensation.  Slightly tremorous. Pupils equal and reactive.     ED Results / Procedures / Treatments   Labs (all labs ordered are listed, but only abnormal results are displayed) Labs Reviewed  COMPREHENSIVE METABOLIC PANEL - Abnormal; Notable for the following components:      Result Value   CO2 12 (*)    Creatinine, Ser 1.45 (*)    Calcium 8.6 (*)    AST 43 (*)    ALT 46 (*)    GFR, Estimated 52 (*)    Anion gap >20 (*)    All other components within normal limits  ETHANOL - Abnormal; Notable for the following components:   Alcohol, Ethyl (B) 267 (*)    All other components within normal limits  CBC - Abnormal; Notable for the following components:   WBC 13.7 (*)    RDW 16.2 (*)    All other components within normal limits  BLOOD GAS, VENOUS - Abnormal; Notable for the following components:   pCO2, Ven 38 (*)    pO2, Ven 55 (*)    Bicarbonate 17.1 (*)    Acid-base deficit 9.3 (*)    All other components within normal limits  LIPASE, BLOOD  MAGNESIUM  RAPID URINE DRUG SCREEN, HOSP PERFORMED  URINALYSIS, ROUTINE W REFLEX MICROSCOPIC  TROPONIN I (HIGH SENSITIVITY)  TROPONIN I (HIGH SENSITIVITY)    EKG None  Radiology No results found.  Procedures Procedures    Medications Ordered in ED Medications  LORazepam (ATIVAN) tablet 1-4 mg (has no administration in time range)    Or  LORazepam (ATIVAN) injection 1-4 mg (has no administration in time range)  thiamine (VITAMIN B1) tablet 100 mg (has no administration in time range)    Or  thiamine (VITAMIN B1) injection 100 mg (has no administration in time range)  folic acid (FOLVITE) tablet 1 mg (has no administration in time range)  ondansetron (ZOFRAN) injection 4 mg (4 mg Intravenous Given 03/22/23 0641)  lactated ringers bolus 1,000 mL (1,000 mLs Intravenous New Bag/Given 03/22/23 0703)    ED Course/ Medical Decision Making/ A&P Clinical Course as of 03/23/23  0742  Thu Mar 22, 2023  0926 Spoke with Dr. Robb Matar given patient's presentation, labs and tachycardia who agrees to admit. [JB]    Clinical Course User Index [JB] Delsie Amador, Horald Chestnut, PA-C                                 Medical Decision Making Amount and/or Complexity of Data Reviewed Labs: ordered.  Risk OTC drugs. Prescription drug management. Decision  regarding hospitalization.   [name] 72 y.o. presented today for nausea. Working DDx includes, but not limited to, gastroenteritis, colitis, SBO, appendicitis, cholecystitis, hepatobiliary pathology, gastritis, PUD, ACS, dissection, pancreatitis, nephrolithiasis, AAA, UTI, pyelonephritis, testicular torsion.  R/o DDx: These are considered less likely than current impression due to history of present illness, physical exam, labs/imaging findings.  Review of prior external notes: 02/26/2023 for same thing  Pmhx: alcohol abuse, AKI, alcohol withdrawal delirium, hypertension, GERD, prediabetes  Unique Tests and My Interpretation:  CBC: Mild leukocytosis of 13.7 no anemia EtOH 267 Initial troponin 16, CMP: Significant anion gap, elevated AST, ALT, Cr 1.45 Lipase: 26 UA: pending  Udrug:   EKG: Rate, rhythm, axis, intervals all examined: Sinus tach  Imaging: none   Problem List / ED Course / Critical interventions / Medication management  Reporting to emergency room after 3 to 4 days of heavy drinking.  Patient is answering questions appropriately with no slurred speech.  No neurological deficit on physical exam.  Patient appears dry.  Labs indicate alcoholic ketoacidosis.  Patient is slightly tremorous experiencing nausea vomiting.  I have ordered Protonix, CIWA precautions, thiamine and folate and fluids.  Given significant anion gap after these are administered ordered dextrose.  Patient also found to have AKI.  Given patient's significant drinking history, labs and sinus tachycardia I feel he would benefit from admission  for further monitoring and care.   Patients vitals assessed. Upon arrival patient is hemodynamically stable, significantly tachycardic. I have reviewed the patients home medicines and have made adjustments as needed  Consult: Hospitalist who agreed to admit  Plan: Alcoholic ketosis          Final Clinical Impression(s) / ED Diagnoses Final diagnoses:  Alcoholic ketoacidosis  AKI (acute kidney injury) [facility])    Rx / DC Orders ED Discharge Orders     None         Smitty Knudsen, PA-C 03/23/23 0745    Royanne Foots, DO 03/25/23 2040

## 2023-03-22 NOTE — ED Notes (Signed)
ED TO INPATIENT HANDOFF REPORT  Name/Age/Gender Ruben [name] 72 y.o. male  Code Status    Code Status Orders  (From admission, onward)           Start     Ordered   03/22/23 0936  Full code  Continuous       Question:  By:  Answer:  Consent: discussion documented in EHR   03/22/23 0938           Code Status History     Date Active Date Inactive Code Status Order ID Comments User Context   02/26/2023 1021 03/01/2023 1458 Full Code [id number]  Lorin Glass, MD ED   02/07/2023 2139 02/10/2023 1742 Full Code [id number]  Buena Irish, MD ED   02/02/2023 0852 02/04/2023 1739 Full Code [id number]  Maryln Gottron, MD ED   12/17/2022 1123 12/19/2022 1645 Full Code [id number]  Lanier Clam, NP ED   05/16/2022 0834 05/16/2022 1633 Full Code [id number]  Elder Negus, MD Inpatient   09/08/2020 2343 09/14/2020 1644 Full Code [id number]  Briscoe Deutscher, MD ED   06/23/2020 0017 06/28/2020 1620 Full Code [id number]  Briscoe Deutscher, MD ED   06/22/2020 1508 06/23/2020 0017 Full Code [id number]  Alvira Monday, MD ED   04/03/2020 2039 04/08/2020 2132 Full Code [id number]  Charlsie Quest, MD ED   12/22/2019 0754 12/30/2019 1916 Full Code [id number]  Lucile Shutters, MD ED   10/01/2019 0613 10/06/2019 2141 Full Code [id number]  Eduard Clos, MD ED   06/21/2019 2150 06/27/2019 2228 Full Code [id number]  Rometta Emery, MD Inpatient   04/26/2018 1228 04/28/2018 1851 Full Code [id number]  Axel Filler, MD Inpatient       Home/SNF/Other Home  Chief Complaint Alcoholic ketoacidosis [E87.29]  Level of Care/Admitting Diagnosis ED Disposition     ED Disposition  Admit   Condition  --   Comment  Hospital Area: [facility] [[id number]]  Level of Care: Stepdown [14]  Admit to SDU based on following criteria: Hemodynamic compromise or significant risk of instability:  Patient requiring short term acute titration and management of vasoactive drips, and invasive  monitoring (i.e., CVP and Arterial line).  May admit patient to [facility] or [facility] if equivalent level of care is available:: No  Covid Evaluation: Asymptomatic - no recent exposure (last 10 days) testing not required  Diagnosis: Alcoholic ketoacidosis [172225]  Admitting Physician: Bobette Mo [[id number]]  Attending Physician: Bobette Mo [[id number]]  Certification:: I certify this patient will need inpatient services for at least 2 midnights  Expected Medical Readiness: 03/26/2023          Medical History Past Medical History:  Diagnosis Date   Alcoholism [facility])    Anxiety    Aortic atherosclerosis (HCC) 12/26/2016   Noted on CT   Arthritis    BPH (benign prostatic hyperplasia)    Chronic constipation    takes magnesium   Depression    Duodenal ulcer    Dyspnea 12/17/2017   ED (erectile dysfunction)    GERD (gastroesophageal reflux disease)    Heart palpitations    Occ   Hiatal hernia    History of adenomatous polyp of colon    tubular adenoma's 2005   History of Helicobacter pylori infection    2008   Hypercholesteremia    Hypertension    MR (mitral regurgitation)    Mild, noted on ECHO   Palpitations    Pre-diabetes  Pulmonary nodule 12/26/2016   noted on CT, 5 x 3 mm nodular opacity right upper lobe   Rosacea    Schatzki's ring    last dilated 02/ 2016   Urinary retention    history of   Varicocele 02/25/2014   Small to moderate left varicocele    Allergies No Known Allergies  IV Location/Drains/Wounds Patient Lines/Drains/Airways Status     Active Line/Drains/Airways     Name Placement date Placement time Site Days   Peripheral IV 03/22/23 20 G Anterior;Distal;Right;Upper Arm 03/22/23  0641  Arm  less than 1            Labs/Imaging Results for orders placed or performed during the hospital encounter of 03/22/23 (from the past 48 hours)  Comprehensive metabolic panel     Status: Abnormal   Collection Time: 03/22/23   6:16 AM  Result Value Ref Range   Sodium 139 135 - 145 mmol/L   Potassium 4.3 3.5 - 5.1 mmol/L   Chloride 101 98 - 111 mmol/L   CO2 12 (L) 22 - 32 mmol/L   Glucose, Bld 92 70 - 99 mg/dL    Comment: Glucose reference range applies only to samples taken after fasting for at least 8 hours.   BUN 15 8 - 23 mg/dL   Creatinine, Ser 4.09 (H) 0.61 - 1.24 mg/dL   Calcium 8.6 (L) 8.9 - 10.3 mg/dL   Total Protein 7.7 6.5 - 8.1 g/dL   Albumin 4.1 3.5 - 5.0 g/dL   AST 43 (H) 15 - 41 U/L   ALT 46 (H) 0 - 44 U/L   Alkaline Phosphatase 75 38 - 126 U/L   Total Bilirubin 0.7 <1.2 mg/dL   GFR, Estimated 52 (L) >60 mL/min    Comment: (NOTE) Calculated using the CKD-EPI Creatinine Equation (2021)    Anion gap >20 (H) 5 - 15    Comment: Performed at [facility], [address], [place], [place] [address]  Ethanol     Status: Abnormal   Collection Time: 03/22/23  6:16 AM  Result Value Ref Range   Alcohol, Ethyl (B) 267 (H) <10 mg/dL    Comment: (NOTE) Lowest detectable limit for serum alcohol is 10 mg/dL.  For medical purposes only. Performed at [facility], [address] [place], [place] [address]   cbc     Status: Abnormal   Collection Time: 03/22/23  6:16 AM  Result Value Ref Range   WBC 13.7 (H) 4.0 - 10.5 K/uL   RBC 5.55 4.22 - 5.81 MIL/uL   Hemoglobin 16.9 13.0 - 17.0 g/dL   HCT 56.2 13.0 - 86.5 %   MCV 91.7 80.0 - 100.0 fL   MCH 30.5 26.0 - 34.0 pg   MCHC 33.2 30.0 - 36.0 g/dL   RDW 78.4 (H) 69.6 - 29.5 %   Platelets 263 150 - 400 K/uL   nRBC 0.0 0.0 - 0.2 %    Comment: Performed at [facility], [address] [place], [place] [address]  Troponin I (High Sensitivity)     Status: None   Collection Time: 03/22/23  6:16 AM  Result Value Ref Range   Troponin I (High Sensitivity) 16 <18 ng/L    Comment: (NOTE) Elevated high sensitivity troponin I (hsTnI) values and significant  changes across serial measurements may  suggest ACS but many other  chronic and acute conditions are known to elevate hsTnI results.  Refer to the "Links" section for chest  pain algorithms and additional  guidance. Performed at [facility], [address], [place], [place] [address]   Lipase, blood     Status: None   Collection Time: 03/22/23  6:16 AM  Result Value Ref Range   Lipase 26 11 - 51 U/L    Comment: Performed at [facility], [address], [place], [place] [address]  Magnesium     Status: None   Collection Time: 03/22/23  6:16 AM  Result Value Ref Range   Magnesium 2.4 1.7 - 2.4 mg/dL    Comment: Performed at [facility], [address], [place], [place] [address]  Blood gas, venous (at [facility] [place])     Status: Abnormal   Collection Time: 03/22/23  8:30 AM  Result Value Ref Range   pH, Ven 7.26 7.25 - 7.43   pCO2, Ven 38 (L) 44 - 60 mmHg   pO2, Ven 55 (H) 32 - 45 mmHg   Bicarbonate 17.1 (L) 20.0 - 28.0 mmol/L   Acid-base deficit 9.3 (H) 0.0 - 2.0 mmol/L   O2 Saturation 85.3 %   Patient temperature 37.0     Comment: Performed at [facility], [address], [place], [place] [address]  Troponin I (High Sensitivity)     Status: Abnormal   Collection Time: 03/22/23 10:33 AM  Result Value Ref Range   Troponin I (High Sensitivity) 18 (H) <18 ng/L    Comment: (NOTE) Elevated high sensitivity troponin I (hsTnI) values and significant  changes across serial measurements may suggest ACS but many other  chronic and acute conditions are known to elevate hsTnI results.  Refer to the "Links" section for chest pain algorithms and additional  guidance. Performed at [facility], [address], [place], [place] [address]    No results found.  Pending Labs Unresulted Labs (From admission, onward)     Start     Ordered   03/23/23 0500  CBC  Tomorrow morning,   R        03/22/23 0938   03/23/23 0500  Comprehensive  metabolic panel  Tomorrow morning,   R        03/22/23 0938   03/22/23 0718  Urinalysis, Routine w reflex microscopic -Urine, Clean Catch  Once,   URGENT       Question:  Specimen Source  Answer:  Urine, Clean Catch   03/22/23 0717   03/22/23 0615  Rapid urine drug screen (hospital performed)  Once,   STAT        03/22/23 0615            Vitals/Pain Today's Vitals   03/22/23 0645 03/22/23 0805 03/22/23 1147 03/22/23 1227  BP: 133/76 132/88  132/87  Pulse: (!) 136 (!) 128  (!) 106  Resp: 13 13  16   Temp:  98.1 F (36.7 C) 98 F (36.7 C) 98.3 F (36.8 C)  TempSrc:  Oral  Oral  SpO2: 95% 93%  96%    Isolation Precautions No active isolations  Medications Medications  thiamine (VITAMIN B1) tablet 100 mg ( Oral See Alternative 03/22/23 0825)    Or  thiamine (VITAMIN B1) injection 100 mg (100 mg Intravenous Given 03/22/23 0825)  folic acid (FOLVITE) tablet 1 mg (1 mg Oral Given 03/22/23 0827)  dextrose 5 % in lactated ringers infusion ( Intravenous New Bag/Given 03/22/23 1031)  PHENobarbital (LUMINAL) tablet 64.8 mg (64.8 mg Oral Given 03/22/23 1026)  PHENobarbital (LUMINAL) tablet  32.4 mg (has no administration in time range)  phenobarbital (LUMINAL) tablet 16.2 mg (has no administration in time range)  LORazepam (ATIVAN) tablet 1-4 mg ( Oral See Alternative 03/22/23 1018)    Or  LORazepam (ATIVAN) injection 1-4 mg (1 mg Intravenous Given 03/22/23 1018)  multivitamin with minerals tablet 1 tablet (1 tablet Oral Given 03/22/23 1025)  LORazepam (ATIVAN) tablet 0-4 mg (2 mg Oral Given 03/22/23 1352)    Followed by  LORazepam (ATIVAN) tablet 0-4 mg (has no administration in time range)  enoxaparin (LOVENOX) injection 40 mg (has no administration in time range)  acetaminophen (TYLENOL) tablet 650 mg (has no administration in time range)    Or  acetaminophen (TYLENOL) suppository 650 mg (has no administration in time range)  ondansetron (ZOFRAN) tablet 4 mg (has no  administration in time range)    Or  ondansetron (ZOFRAN) injection 4 mg (has no administration in time range)  ondansetron (ZOFRAN) injection 4 mg (4 mg Intravenous Given 03/22/23 0641)  lactated ringers bolus 1,000 mL (0 mLs Intravenous Stopped 03/22/23 0829)  LORazepam (ATIVAN) injection 2 mg (2 mg Intravenous Given 03/22/23 0720)  pantoprazole (PROTONIX) injection 40 mg (40 mg Intravenous Given 03/22/23 0725)  dextrose 5 % bolus 1,000 mL (0 mLs Intravenous Stopped 03/22/23 1010)  dextrose 50 % solution 25 g (25 g Intravenous Given 03/22/23 1019)  insulin aspart (novoLOG) injection 5 Units (5 Units Subcutaneous Given 03/22/23 1027)  magnesium sulfate IVPB 2 g 50 mL (0 g Intravenous Stopped 03/22/23 1118)  metoprolol tartrate (LOPRESSOR) injection 5 mg (5 mg Intravenous Given 03/22/23 1019)  prochlorperazine (COMPAZINE) injection 10 mg (10 mg Intravenous Given 03/22/23 1018)    Mobility walks with person assist

## 2023-03-22 NOTE — H&P (Addendum)
History and Physical    Patient: Ruben [name] QIH:[id number] DOB: 07-19-1950 DOA: 03/22/2023 DOS: the patient was seen and examined on 03/22/2023 PCP: Soundra Pilon, FNP  Patient coming from: Home  Chief Complaint:  Chief Complaint  Patient presents with   Emesis   HPI: Ruben [name] is a 72 y.o. male with medical history significant of alcoholism, anxiety, depression, aortic atherosclerosis, osteoarthritis, BPH, chronic constipation, duodenal ulcer, Schatzki's ring, GERD, hiatal hernia, colon polyps, H. pylori infection, hyperlipidemia, hypertension, heart palpitations, ED, rosacea, urinary retention, varicose Selle who presented to the emergency department with complaints of abdominal pain, nausea and emesis in the setting of much heavier than usual liquid consumption.  No diarrhea, constipation, melena or hematochezia. No flank pain, dysuria, frequency or hematuria.He denied fever, chills, rhinorrhea, sore throat, wheezing or hemoptysis. No chest pain, palpitations, diaphoresis, PND, orthopnea or pitting edema of the lower extremities.  No polyuria, polydipsia, polyphagia or blurred vision.   Lab work: CBC showed a white count of 13.7, hemoglobin 16.9 g/dL platelets 875.  CMP showed CO2 of 12 mmol/L with an anion gap greater than 20.  Creatinine was 1.45 and calcium 8.6 mg/deciliter.  The rest of the electrolytes, glucose and BUN were normal.  AST 43 and ALT 46 units/L.  The rest of the transaminases were normal.  Venous blood gas showed a pH of 7.26, pCO2 of 38 and pO2 of 55 mmHg.  Bicarbonate was 17.1 and acid-base deficit 9.3 mmol/L.  Normal lipase, magnesium and first troponin level.  Alcohol level was 267 mg/dL.   ED course: Initial vital signs were temperature 98 F, pulse 160, respiration 18, BP 135/84 mmHg O2 sat 97% on room air.  The patient received lorazepam 2 mg IVP, LR 1000 mL bolus, dextrose 5% 1000 mL bolus, ondansetron 4 mg IVP and pantoprazole 4 mg IVP.  I added Compazine 10  mg IVP, metoprolol tartrate 5 mg IVP, dextrose 25 g IVP, magnesium sulfate 2 g IVPB and started the patient on D5 LR at 125 mL/h.  Review of Systems: As mentioned in the history of present illness. All other systems reviewed and are negative. Past Medical History:  Diagnosis Date   Alcoholism (HCC)    Anxiety    Aortic atherosclerosis (HCC) 12/26/2016   Noted on CT   Arthritis    BPH (benign prostatic hyperplasia)    Chronic constipation    takes magnesium   Depression    Duodenal ulcer    Dyspnea 12/17/2017   ED (erectile dysfunction)    GERD (gastroesophageal reflux disease)    Heart palpitations    Occ   Hiatal hernia    History of adenomatous polyp of colon    tubular adenoma's 2005   History of Helicobacter pylori infection    2008   Hypercholesteremia    Hypertension    MR (mitral regurgitation)    Mild, noted on ECHO   Palpitations    Pre-diabetes    Pulmonary nodule 12/26/2016   noted on CT, 5 x 3 mm nodular opacity right upper lobe   Rosacea    Schatzki's ring    last dilated 02/ 2016   Urinary retention    history of   Varicocele 02/25/2014   Small to moderate left varicocele   Past Surgical History:  Procedure Laterality Date   BALLOON DILATION N/A 06/08/2014   Procedure: BALLOON DILATION;  Surgeon: Charolett Bumpers, MD;  Location: [facility];  Service: Endoscopy;  Laterality: N/A;   COLONOSCOPY WITH  PROPOFOL N/A 06/08/2014   Procedure: COLONOSCOPY WITH PROPOFOL;  Surgeon: Charolett Bumpers, MD;  Location: [facility];  Service: Endoscopy;  Laterality: N/A;   ESOPHAGOGASTRODUODENOSCOPY N/A 06/08/2014   Procedure: ESOPHAGOGASTRODUODENOSCOPY (EGD);  Surgeon: Charolett Bumpers, MD;  Location: [facility];  Service: Endoscopy;  Laterality: N/A;   ESOPHAGOGASTRODUODENOSCOPY (EGD) WITH ESOPHAGEAL DILATION N/A 12/31/2012   Procedure: ESOPHAGOGASTRODUODENOSCOPY (EGD) WITH ESOPHAGEAL DILATION;  Surgeon: Charolett Bumpers, MD;  Location: [facility];  Service:  Endoscopy;  Laterality: N/A;   GREEN LIGHT LASER TURP (TRANSURETHRAL RESECTION OF PROSTATE N/A 12/28/2015   Procedure: GREEN LIGHT LASER TURP (TRANSURETHRAL RESECTION OF PROSTATE;  Surgeon: Jerilee Field, MD;  Location: [facility];  Service: Urology;  Laterality: N/A;   HERNIA REPAIR  04/2018   hiatal   LEFT HEART CATH AND CORONARY ANGIOGRAPHY N/A 05/16/2022   Procedure: LEFT HEART CATH AND CORONARY ANGIOGRAPHY;  Surgeon: Elder Negus, MD;  Location: [facility];  Service: Cardiovascular;  Laterality: N/A;   Social History:  reports that he has never smoked. He has never used smokeless tobacco. He reports current alcohol use of about 154.0 standard drinks of alcohol per week. He reports that he does not currently use drugs after having used the following drugs: Marijuana.  No Known Allergies  Family History  Problem Relation Age of Onset   Heart failure Mother    Breast cancer Mother    Stroke Father    Other Father        Colon Issues   Lung disease Neg Hx     Prior to Admission medications   Medication Sig Start Date End Date Taking? Authorizing Provider  cloNIDine (CATAPRES) 0.1 MG tablet Take 1 tablet (0.1 mg total) by mouth 2 (two) times daily. Patient taking differently: Take 0.1 mg by mouth at bedtime. 02/04/23   Almon Hercules, MD  desvenlafaxine (PRISTIQ) 50 MG 24 hr tablet Take 50 mg by mouth daily. 03/05/23   [provider]  finasteride (PROSCAR) 5 MG tablet Take 5 mg by mouth daily.    [provider]  folic acid (FOLVITE) 1 MG tablet Take 1 tablet (1 mg total) by mouth daily. 03/01/23   Osvaldo Shipper, MD  gabapentin (NEURONTIN) 300 MG capsule Take 300 mg by mouth 2 (two) times daily.    [provider]  hydrOXYzine (ATARAX) 50 MG tablet Take 50 mg by mouth daily.    [provider]  LORazepam (ATIVAN) 0.5 MG tablet Take 0.5 mg by mouth every 8 (eight) hours as needed for anxiety.    [provider]  magnesium hydroxide (MILK OF MAGNESIA) 400 MG/5ML suspension Take 30 mLs by mouth every other day.    [provider]  metoprolol tartrate (LOPRESSOR) 50 MG tablet Take 50 mg by mouth 2 (two) times daily.    [provider]  QUEtiapine (SEROQUEL) 300 MG tablet Take 300 mg by mouth at bedtime. 12/02/22   [provider]  rosuvastatin (CRESTOR) 20 MG tablet Take 20 mg by mouth every Monday, Wednesday, and Friday. 08/23/21   [provider]  tamsulosin (FLOMAX) 0.4 MG CAPS capsule Take 0.4 mg by mouth at bedtime.    [provider]    Physical Exam: Vitals:   03/22/23 0614 03/22/23 0630 03/22/23 0645 03/22/23 0805  BP:  (!) 138/95 133/76 132/88  Pulse:  98 (!) 136 (!) 128  Resp: 13 (!) 23 13 13   Temp:    98.1 F (36.7 C)  TempSrc:    Oral  SpO2:  96% 95% 93%   Physical Exam Vitals reviewed.  Constitutional:      General: He is awake. He is not in acute distress.    Appearance: He is obese. He is ill-appearing.  HENT:     Head: Normocephalic.     Nose: No rhinorrhea.     Mouth/Throat:     Mouth: Mucous membranes are moist.  Eyes:     General: No scleral icterus.    Pupils: Pupils are equal, round, and reactive to light.  Cardiovascular:     Rate and Rhythm: Regular rhythm. Tachycardia present.  Pulmonary:     Effort: Pulmonary effort is normal.     Breath sounds: Normal breath sounds.  Abdominal:     General: Bowel sounds are normal.     Palpations: Abdomen is soft.     Tenderness: There is no abdominal tenderness.  Musculoskeletal:     Cervical back: Neck supple.     Right lower leg: No edema.     Left lower leg: No edema.  Skin:    General: Skin is warm and dry.  Neurological:     General: No focal deficit present.     Mental Status: He is alert and oriented to person, place, and time.  Psychiatric:        Mood and Affect: Mood normal.        Behavior: Behavior normal. Behavior is cooperative.     Data  Reviewed:  Results are pending, will review when available.  EKG: Vent. rate 152 BPM PR interval 108 ms QRS duration 83 ms QT/QTcB 281/447 ms P-R-T axes 29 14 -28 Supraventricular tachycardia Borderline low voltage, extremity leads  Assessment and Plan: Principal Problem:   Alcoholic ketoacidosis Inpatient/stepdown. Continue IV fluids. Clear liquid diet. Dextrose 25 g IVP x 1. Dextrose 5% and LR. Monitor CBG every 4 hours. BMP every this evening. Replace electrolytes as needed.  Active Problems:   Alcohol use disorder, severe, dependence (HCC)   Alcohol intoxication (HCC)   Severe alcohol withdrawal delirium (HCC) CIWA protocol with lorazepam. Also started on 3-day phenobarbital taper. Magnesium sulfate supplementation. Folate, MVI and thiamine. Consult TOC team.    AKI (acute kidney injury) (HCC) Secondary to:   Dehydration Continue IV fluids. Intake and output. Avoid nephrotoxins and hypotension. Follow-up renal function electrolytes in the morning.    Sinus tachycardia Secondary to EtOH withdrawal. Metoprolol 5 mg IVP x 1. Resume metoprolol 50 mg p.o. twice daily.    GERD (gastroesophageal reflux disease) Continue pantoprazole 40 mg p.o. daily.    BPH (benign prostatic hyperplasia) Continue finasteride 5 mg p.o. daily. Continue tamsulosin 0.4 mg p.o. daily.    Essential hypertension Continue metoprolol 50 mg p.o. twice daily.    Hyperlipidemia   Aortic atherosclerosis (HCC) Hold rosuvastatin in the setting of transaminitis.    Hyperbilirubinemia   Elevated LFTs Secondary to alcohol abuse. Follow hepatic functions tomorrow morning.    Advance Care Planning:   Code Status: Full Code   Consults:   Family Communication:   Severity of Illness: The appropriate patient status for this patient is INPATIENT. Inpatient status is judged to be reasonable and necessary in order to provide the required intensity of service to ensure the patient's  safety. The patient's presenting symptoms, physical exam findings, and initial radiographic and laboratory data in the context of their chronic comorbidities is felt to place them at high risk for further clinical deterioration. Furthermore, it is not anticipated  that the patient will be medically stable for discharge from the hospital within 2 midnights of admission.   * I certify that at the point of admission it is my clinical judgment that the patient will require inpatient hospital care spanning beyond 2 midnights from the point of admission due to high intensity of service, high risk for further deterioration and high frequency of surveillance required.*  Author: Bobette Mo, MD 03/22/2023 9:16 AM  For on call review [email or web address].   This document was prepared using Dragon voice recognition software and may contain some unintended transcription errors.

## 2023-03-22 NOTE — Plan of Care (Signed)
  Problem: Health Behavior/Discharge Planning: Goal: Ability to manage health-related needs will improve Outcome: Progressing   Problem: Clinical Measurements: Goal: Ability to maintain clinical measurements within normal limits will improve Outcome: Progressing Goal: Respiratory complications will improve Outcome: Progressing Goal: Cardiovascular complication will be avoided Outcome: Progressing   

## 2023-03-23 DIAGNOSIS — I700 Atherosclerosis of aorta: Secondary | ICD-10-CM

## 2023-03-23 DIAGNOSIS — N400 Enlarged prostate without lower urinary tract symptoms: Secondary | ICD-10-CM

## 2023-03-23 DIAGNOSIS — E860 Dehydration: Secondary | ICD-10-CM

## 2023-03-23 DIAGNOSIS — N179 Acute kidney failure, unspecified: Secondary | ICD-10-CM

## 2023-03-23 DIAGNOSIS — R7989 Other specified abnormal findings of blood chemistry: Secondary | ICD-10-CM

## 2023-03-23 DIAGNOSIS — F10921 Alcohol use, unspecified with intoxication delirium: Secondary | ICD-10-CM | POA: Diagnosis not present

## 2023-03-23 DIAGNOSIS — I10 Essential (primary) hypertension: Secondary | ICD-10-CM

## 2023-03-23 DIAGNOSIS — E8729 Other acidosis: Secondary | ICD-10-CM | POA: Diagnosis not present

## 2023-03-23 DIAGNOSIS — F10931 Alcohol use, unspecified with withdrawal delirium: Secondary | ICD-10-CM

## 2023-03-23 LAB — CBC
HCT: 45.7 % (ref 39.0–52.0)
Hemoglobin: 14.9 g/dL (ref 13.0–17.0)
MCH: 29.9 pg (ref 26.0–34.0)
MCHC: 32.6 g/dL (ref 30.0–36.0)
MCV: 91.8 fL (ref 80.0–100.0)
Platelets: 145 10*3/uL — ABNORMAL LOW (ref 150–400)
RBC: 4.98 MIL/uL (ref 4.22–5.81)
RDW: 15.7 % — ABNORMAL HIGH (ref 11.5–15.5)
WBC: 5 10*3/uL (ref 4.0–10.5)
nRBC: 0 % (ref 0.0–0.2)

## 2023-03-23 LAB — COMPREHENSIVE METABOLIC PANEL
ALT: 41 U/L (ref 0–44)
AST: 37 U/L (ref 15–41)
Albumin: 3.4 g/dL — ABNORMAL LOW (ref 3.5–5.0)
Alkaline Phosphatase: 62 U/L (ref 38–126)
Anion gap: 8 (ref 5–15)
BUN: 10 mg/dL (ref 8–23)
CO2: 26 mmol/L (ref 22–32)
Calcium: 8 mg/dL — ABNORMAL LOW (ref 8.9–10.3)
Chloride: 100 mmol/L (ref 98–111)
Creatinine, Ser: 1.03 mg/dL (ref 0.61–1.24)
GFR, Estimated: >60 mL/min (ref >60)
Glucose, Bld: 159 mg/dL — ABNORMAL HIGH (ref 70–99)
Potassium: 3.9 mmol/L (ref 3.5–5.1)
Sodium: 134 mmol/L — ABNORMAL LOW (ref 135–145)
Total Bilirubin: 1.1 mg/dL (ref <1.2)
Total Protein: 6.5 g/dL (ref 6.5–8.1)

## 2023-03-23 LAB — URINALYSIS, ROUTINE W REFLEX MICROSCOPIC
Bilirubin Urine: NEGATIVE
Glucose, UA: 50 mg/dL — AB
Hgb urine dipstick: NEGATIVE
Ketones, ur: 20 mg/dL — AB
Leukocytes,Ua: NEGATIVE
Nitrite: NEGATIVE
Protein, ur: NEGATIVE mg/dL
Specific Gravity, Urine: 1.013 (ref 1.005–1.030)
pH: 5 (ref 5.0–8.0)

## 2023-03-23 LAB — GLUCOSE, CAPILLARY
Glucose-Capillary: 143 mg/dL — ABNORMAL HIGH (ref 70–99)
Glucose-Capillary: 121 mg/dL — ABNORMAL HIGH (ref 70–99)
Glucose-Capillary: 100 mg/dL — ABNORMAL HIGH (ref 70–99)
Glucose-Capillary: 111 mg/dL — ABNORMAL HIGH (ref 70–99)

## 2023-03-23 LAB — RAPID URINE DRUG SCREEN, HOSP PERFORMED
Amphetamines: NOT DETECTED
Barbiturates: POSITIVE — AB
Benzodiazepines: POSITIVE — AB
Cocaine: NOT DETECTED
Opiates: NOT DETECTED
Tetrahydrocannabinol: NOT DETECTED

## 2023-03-23 NOTE — Progress Notes (Signed)
PROGRESS NOTE  COYE DERAAD MVH:[id number] DOB: [birth year]   PCP: Soundra Pilon, FNP  Patient is from: Home.  Lives alone.  Independently ambulates at baseline  DOA: 03/22/2023 LOS: 1  Chief complaints Chief Complaint  Patient presents with   Emesis     Brief Narrative / Interim history: 72 year old M with PMH of alcohol abuse, recurrent hospitalization for alcohol withdrawal (6 times in the last 3 months) prediabetes, HTN, HLD, GERD, BPH and obesity presenting with nausea, vomiting and abdominal pain in the setting of alcohol intoxication and withdrawal, admitted for alcoholic ketoacidosis, AKI, alcohol intoxication and withdrawal.  EtOH level 267.  Creatinine 1.45.  Bicarb 12 with anion gap of  > 20.  Patient reports drinking about 5th bottle of vodka every day.  Patient has history of alcohol withdrawal seizure.  Patient was started on IV fluid, phenobarbital taper scheduled and as needed Ativan.    Subjective: Seen and examined earlier this morning.  No major events overnight of this morning.  Has no complaints.  No withdrawal symptoms.  Has been receiving Ativan around-the-clock overnight.  Did not receive much of the as needed Ativan.  He is also on phenobarbital taper.  Likes to eat regular food.  Objective: Vitals:   03/23/23 0800 03/23/23 0900 03/23/23 1000 03/23/23 1033  BP: (!) 143/78 (!) 155/85 (!) 142/77 (!) 142/77  Pulse: 72 67 (!) 116 94  Resp: (!) 21 11 17    Temp:      TempSrc:      SpO2: 97% 96% 97%   Weight:      Height:        Examination:  GENERAL: No apparent distress.  Nontoxic. HEENT: MMM.  Vision and hearing grossly intact.  NECK: Supple.  No apparent JVD.  RESP:  No IWOB.  Fair aeration bilaterally. CVS:  RRR. Heart sounds normal.  ABD/GI/GU: BS+. Abd soft, NTND.  MSK/EXT:  Moves extremities. No apparent deformity. No edema.  SKIN: no apparent skin lesion or wound NEURO: Awake, alert and oriented appropriately.  No apparent focal neuro  deficit. PSYCH: Calm. Normal affect.   Procedures:  None  Microbiology summarized: MRSA PCR screen nonreactive  Assessment and plan: Alcoholic ketoacidosis: Resolved. Severe alcohol use disorder: Drinks vodka.  Six admission in the last 3 months.  Tried multiple rehabs in the past. Alcohol withdrawal delirium?No withdrawal symptoms today.  Was getting scheduled Ativan overnight -Discontinue scheduled Ativan -Continue phenobarbital taper -Continue as needed Ativan -Continue multivitamin, thiamine and folic acid -Start soft diet. -Encouraged alcohol cessation -TOC consult    AKI/dehydration: Likely due to dehydration from alcohol.  Resolved. Recent Labs    02/08/23 0336 02/09/23 0839 02/10/23 0234 02/26/23 0750 02/27/23 0318 02/28/23 0538 03/01/23 0556 03/22/23 0616 03/22/23 1843 03/23/23 0308  BUN 11 14 9 15 13 13 15 15 11 10   CREATININE 1.03 1.15 0.96 1.43* 1.00 0.94 0.89 1.45* 1.08 1.03  -Discontinue IV fluid -Start soft diet  Elevated LFTs/hyperbilirubinemia: Likely due to alcohol.  Resolved.   Essential hypertension:  BP slightly elevated.  Tachycardia resolved. -Resume home clonidine.  Takes at bedtime.  Increase to twice daily -Continue home metoprolol   Peripheral neuropathy -Continue home gabapentin at bedtime   Mood disorder/depression: Stable -Continue home meds   BPH without LUTS -Continue home Flomax and Proscar.   Hyponatremia: Mild.  Likely from alcohol. -Monitor   Overweight Body mass index is 29.79 kg/m.           DVT prophylaxis:  enoxaparin (LOVENOX) injection  40 mg Start: 03/22/23 2200  Code Status: Full code Family Communication: None at bedside Level of care: Telemetry Status is: Inpatient Remains inpatient appropriate because: Alcohol withdrawal   Final disposition: Home Consultants:  None  55 minutes with more than 50% spent in reviewing records, counseling patient/family and coordinating care.   Sch Meds:   Scheduled Meds:  Chlorhexidine Gluconate Cloth  6 each Topical Daily   enoxaparin (LOVENOX) injection  40 mg Subcutaneous Q24H   finasteride  5 mg Oral Daily   folic acid  1 mg Oral Daily   gabapentin  300 mg Oral BID   metoprolol tartrate  50 mg Oral BID   multivitamin with minerals  1 tablet Oral Daily   pantoprazole  40 mg Oral Daily   [START ON 03/24/2023] PHENobarbital  16.2 mg Oral BID   phenobarbital  32.4 mg Oral BID   QUEtiapine  300 mg Oral QHS   tamsulosin  0.4 mg Oral QHS   thiamine  100 mg Oral Daily   Or   thiamine  100 mg Intravenous Daily   Continuous Infusions: PRN Meds:.acetaminophen **OR** acetaminophen, LORazepam **OR** LORazepam, ondansetron **OR** ondansetron (ZOFRAN) IV, mouth rinse  Antimicrobials: Anti-infectives (From admission, onward)    None        I have personally reviewed the following labs and images: CBC: Recent Labs  Lab 03/22/23 0616 03/23/23 0308  WBC 13.7* 5.0  HGB 16.9 14.9  HCT 50.9 45.7  MCV 91.7 91.8  PLT 263 145*   BMP &GFR Recent Labs  Lab 03/22/23 0616 03/22/23 1843 03/23/23 0308  NA 139 135 134*  K 4.3 4.9 3.9  CL 101 102 100  CO2 12* 24 26  GLUCOSE 92 118* 159*  BUN 15 11 10   CREATININE 1.45* 1.08 1.03  CALCIUM 8.6* 7.9* 8.0*  MG 2.4  --   --    Estimated Creatinine Clearance: 78.1 mL/min (by C-G formula based on SCr of 1.03 mg/dL). Liver & Pancreas: Recent Labs  Lab 03/22/23 0616 03/23/23 0308  AST 43* 37  ALT 46* 41  ALKPHOS 75 62  BILITOT 0.7 1.1  PROT 7.7 6.5  ALBUMIN 4.1 3.4*   Recent Labs  Lab 03/22/23 0616  LIPASE 26   No results for input(s): "AMMONIA" in the last 168 hours. Diabetic: No results for input(s): "HGBA1C" in the last 72 hours. Recent Labs  Lab 03/22/23 2221 03/23/23 0748 03/23/23 1202  GLUCAP 117* 143* 121*   Cardiac Enzymes: No results for input(s): "CKTOTAL", "CKMB", "CKMBINDEX", "TROPONINI" in the last 168 hours. No results for input(s): "PROBNP" in the last  8760 hours. Coagulation Profile: No results for input(s): "INR", "PROTIME" in the last 168 hours. Thyroid Function Tests: No results for input(s): "TSH", "T4TOTAL", "FREET4", "T3FREE", "THYROIDAB" in the last 72 hours. Lipid Profile: No results for input(s): "CHOL", "HDL", "LDLCALC", "TRIG", "CHOLHDL", "LDLDIRECT" in the last 72 hours. Anemia Panel: No results for input(s): "VITAMINB12", "FOLATE", "FERRITIN", "TIBC", "IRON", "RETICCTPCT" in the last 72 hours. Urine analysis:    Component Value Date/Time   COLORURINE YELLOW 03/23/2023 0627   APPEARANCEUR CLEAR 03/23/2023 0627   LABSPEC 1.013 03/23/2023 0627   PHURINE 5.0 03/23/2023 0627   GLUCOSEU 50 (A) 03/23/2023 0627   HGBUR NEGATIVE 03/23/2023 0627   BILIRUBINUR NEGATIVE 03/23/2023 0627   KETONESUR 20 (A) 03/23/2023 0627   PROTEINUR NEGATIVE 03/23/2023 0627   NITRITE NEGATIVE 03/23/2023 0627   LEUKOCYTESUR NEGATIVE 03/23/2023 0627   Sepsis Labs: Invalid input(s): "PROCALCITONIN", "LACTICIDVEN"  Microbiology: Recent  Results (from the past 240 hours)  MRSA Next Gen by PCR, Nasal     Status: None   Collection Time: 03/22/23  3:05 PM   Specimen: Nasal Mucosa; Nasal Swab  Result Value Ref Range Status   MRSA by PCR Next Gen NOT DETECTED NOT DETECTED Final    Comment: (NOTE) The GeneXpert MRSA Assay (FDA approved for NASAL specimens only), is one component of a comprehensive MRSA colonization surveillance program. It is not intended to diagnose MRSA infection nor to guide or monitor treatment for MRSA infections. Test performance is not FDA approved in patients less than 63 years old. Performed at [facility], [address], [place], [place] [address]     Radiology Studies: No results found.    Janelly Switalski T. Danen Lapaglia [facility]  If 7PM-7AM, please contact night-coverage [email or web address] 03/23/2023, 12:20 PM

## 2023-03-23 NOTE — Plan of Care (Signed)

## 2023-03-23 NOTE — TOC Initial Note (Signed)
Transition of Care [facility]) - Initial/Assessment Note    Patient Details  Name: Ruben [name] MRN: [id number] Date of Birth: [birth year]  Transition of Care [facility]) CM/SW Contact:    Adrian Prows, RN Phone Number: 03/23/2023, 10:03 AM  Clinical Narrative:                 [facility] consult for SA counseling/education; spoke w/ pt in room; pt says he lives at home; he plans to return at d/c; he identified POC [name] (sister) [phone]; pt will arrange transportation; he also verified he has insurance/PCP; he denies SDOH risks; pt has cane, walker, and grab bars in shower; he does not have HH services or home oxygen; pt declines resources for SA counseling/education; no TOC needs identified; TOC is signing off; please place consult if needed.  Expected Discharge Plan: Home/Self Care Barriers to Discharge: Continued Medical Work up   Patient Goals and CMS Choice Patient states their goals for this hospitalization and ongoing recovery are:: home CMS Medicare.gov Compare Post Acute Care list provided to:: Patient    ownership interest in [facility].provided to:: Patient    Expected Discharge Plan and Services   Discharge Planning Services: CM Consult                                          Prior Living Arrangements/Services   Lives with:: Self Patient language and need for interpreter reviewed:: Yes        Need for Family Participation in Patient Care: Yes (Comment) Care giver support system in place?: Yes (comment) Current home services: DME (cane, walker) Criminal Activity/Legal Involvement Pertinent to Current Situation/Hospitalization: No - Comment as needed  Activities of Daily Living   ADL Screening (condition at time of admission) Independently performs ADLs?: Yes (appropriate for developmental age) Is the patient deaf or have difficulty hearing?: No Does the patient have difficulty seeing, even when wearing glasses/contacts?:  No Does the patient have difficulty concentrating, remembering, or making decisions?: No  Permission Sought/Granted Permission sought to share information with : Case Manager Permission granted to share information with : Yes, Verbal Permission Granted  Share Information with NAME: Case Manager     Permission granted to share info w Relationship: [name] (sister) [phone]     Emotional Assessment Appearance:: Appears stated age Attitude/Demeanor/Rapport: Gracious Affect (typically observed): Accepting Orientation: : Oriented to Self, Oriented to Place, Oriented to  Time, Oriented to Situation Alcohol / Substance Use: Alcohol Use Psych Involvement: No (comment)  Admission diagnosis:  Alcoholic ketoacidosis [E87.29] Tachycardia [R00.0] AKI (acute kidney injury) (HCC) [N17.9] Patient Active Problem List   Diagnosis Date Noted   Alcoholic ketoacidosis 03/22/2023   Alcohol withdrawal hallucinosis (HCC) 03/22/2023   Severe alcohol withdrawal delirium (HCC) 02/27/2023   Lactic acid acidosis 02/26/2023   Encephalopathy acute 12/17/2022   Lactic acidosis 12/17/2022   Dehydration 12/17/2022   Acute lactic acidosis 12/17/2022   Abnormal stress test 05/11/2022   Elevated coronary artery calcium score 05/11/2022   Aortic atherosclerosis (HCC) 04/13/2022   Elevated LFTs    Acute kidney injury (HCC) 09/08/2020   Hyponatremia 09/08/2020   Hyperbilirubinemia 09/08/2020   Sinus tachycardia 08/16/2020   Palpitations 08/16/2020   Exertional dyspnea 08/16/2020   Mild renal insufficiency 06/23/2020   Actinic keratosis 06/04/2020   Prediabetes 06/04/2020   MDD (major depressive disorder), recurrent episode, moderate (HCC) 06/01/2020  Weight loss 04/08/2020   Unsteady gait 04/08/2020   Insomnia 04/08/2020   Lump of skin of left upper extremity 04/08/2020   Irregular heart beat 04/08/2020   Pure hypercholesterolemia 04/08/2020   Alcohol use disorder, severe, dependence (HCC)  04/03/2020   Hyperlipidemia 04/03/2020   Suicidal ideation 04/03/2020   Metabolic acidosis 12/22/2019   Thrombocytopenia (HCC) 12/22/2019   ARF (acute renal failure) (HCC) 10/01/2019   Alcohol withdrawal (HCC) 10/01/2019   Normochromic normocytic anemia 10/01/2019   GERD (gastroesophageal reflux disease) 06/22/2019   BPH (benign prostatic hyperplasia) 06/22/2019   Essential hypertension 06/22/2019   AKI (acute kidney injury) (HCC)    Alcohol intoxication (HCC) 06/21/2019   Shoulder joint pain 03/31/2019   S/P Nissen fundoplication (without gastrostomy tube) procedure 04/26/2018   Rosacea 06/24/2011   PCP:  Soundra Pilon, FNP Pharmacy:   [id number] - [place], [place] - [address] [place] [address] Phone: [phone] Fax: [phone]     Social Drivers of Health (SDOH) Social History: SDOH Screenings   Food Insecurity: No Food Insecurity (03/23/2023)  Housing: Low Risk  (03/23/2023)  Transportation Needs: No Transportation Needs (03/23/2023)  Utilities: Not At Risk (03/23/2023)  Tobacco Use: Low Risk  (03/22/2023)   SDOH Interventions: Food Insecurity Interventions: Intervention Not Indicated, Inpatient TOC Housing Interventions: Intervention Not Indicated, Inpatient TOC Transportation Interventions: Intervention Not Indicated, Inpatient TOC Utilities Interventions: Intervention Not Indicated, Inpatient TOC   Readmission Risk Interventions    03/23/2023   10:01 AM 02/27/2023    4:15 PM 02/08/2023    2:47 PM  Readmission Risk Prevention Plan  Transportation Screening Complete Complete Complete  PCP or Specialist Appt within 3-5 Days Complete Complete Complete  HRI or Home Care Consult Complete Complete Complete  Social Work Consult for Recovery Care Planning/Counseling Complete Complete   Palliative Care Screening Not Applicable Not Applicable Not Applicable  Medication Review Oceanographer) Complete Complete Complete

## 2023-03-23 NOTE — Progress Notes (Signed)
Patient transferred to 4W via wheelchair with no complications. Patient carried all personal belongings on his lap during transport.   Beatrix Shipper

## 2023-03-23 NOTE — Progress Notes (Signed)
COVID Vaccine received:  []  No [x]  Yes Date of any COVID positive Test in last 90 days: no PCP - Jessica Priest FNP Cardiologist -   Chest x-ray - 02/26/23 Epic EKG -  02/26/23 Epic Stress Test - 04/25/22 Epic ECHO - 04/27/22 Epic Cardiac Cath - 05/16/22 Epic  Bowel Prep - [x]  No  []   Yes ______  Pacemaker / ICD device [x]  No []  Yes   Spinal Cord Stimulator:[x]  No []  Yes       History of Sleep Apnea? []  No [x]  Yes   CPAP used?- [x]  No []  Yes    Does the patient monitor blood sugar?          [x]  No []  Yes  []  N/A  Patient has: [x]  NO Hx DM   []  Pre-DM                 []  DM1  []   DM2 Does patient have a [employer] or Dexacom? []  No []  Yes   Fasting Blood Sugar Ranges-  Checks Blood Sugar _____ times a day  GLP1 agonist / usual dose - no GLP1 instructions:  SGLT-2 inhibitors / usual dose - no SGLT-2 instructions:   Blood Thinner / Instructions:no Aspirin Instructions:no  Comments:   Activity level: Patient is able  to climb a flight of stairs without difficulty; [x]  No CP  [x]  No SOB,  ___   Patient can perform ADLs without assistance.   Anesthesia review:   Patient denies shortness of breath, fever, cough and chest pain at PAT appointment.  Patient verbalized understanding and agreement to the Pre-Surgical Instructions that were given to them at this PAT appointment. Patient was also educated of the need to review these PAT instructions again prior to his/her surgery.I reviewed the appropriate phone numbers to call if they have any and questions or concerns.

## 2023-03-23 NOTE — Patient Instructions (Signed)
SURGICAL WAITING ROOM VISITATION  Patients having surgery or a procedure may have no more than 2 support people in the waiting area - these visitors may rotate.    Children under the age of 33 must have an adult with them who is not the patient.  Due to an increase in RSV and influenza rates and associated hospitalizations, children ages 96 and under may not visit patients in [facility] hospitals.  If the patient needs to stay at the hospital during part of their recovery, the visitor guidelines for inpatient rooms apply. Pre-op nurse will coordinate an appropriate time for 1 support person to accompany patient in pre-op.  This support person may not rotate.    Please refer to the [facility] website for the visitor guidelines for Inpatients (after your surgery is over and you are in a regular room).       Your procedure is scheduled on: 03/27/23   Report to [facility] Main Entrance    Report to admitting at  11:15 AM   Call this number if you have problems the morning of surgery [phone]   Do not eat food or drink liquids :After Midnight.        Oral Hygiene is also important to reduce your risk of infection.                                    Remember - BRUSH YOUR TEETH THE MORNING OF SURGERY WITH YOUR REGULAR TOOTHPASTE  DENTURES WILL BE REMOVED PRIOR TO SURGERY PLEASE DO NOT APPLY "Poly grip" OR ADHESIVES!!!   Stop all vitamins and herbal supplements 7 days before surgery.   Take these medicines the morning of surgery with A SIP OF WATER: Pristiq, Proscar, Gabapentin, Metoprolol, Rosuvastatin, Tamsulosin             You may not have any metal on your body including hair pins, jewelry, and body piercing             Do not wear make-up, lotions, powders, perfumes/cologne, or deodorant              Men may shave face and neck.   Do not bring valuables to the hospital. [place] IS NOT             RESPONSIBLE   FOR VALUABLES.   Contacts, glasses, dentures  or bridgework may not be worn into surgery.   Bring small overnight bag day of surgery.   DO NOT BRING YOUR HOME MEDICATIONS TO THE HOSPITAL. PHARMACY WILL DISPENSE MEDICATIONS LISTED ON YOUR MEDICATION LIST TO YOU DURING YOUR ADMISSION IN THE HOSPITAL!    Patients discharged on the day of surgery will not be allowed to drive home.  Someone NEEDS to stay with you for the first 24 hours after anesthesia.   Special Instructions: Bring a copy of your healthcare power of attorney and living will documents the day of surgery if you haven't scanned them before.              Please read over the following fact sheets you were given: IF YOU HAVE QUESTIONS ABOUT YOUR PRE-OP INSTRUCTIONS PLEASE CALL [phone] Rosey Bath   If you received a COVID test during your pre-op visit  it is requested that you wear a mask when out in public, stay away from anyone that may not be feeling well and notify your surgeon if you develop  symptoms. If you test positive for Covid or have been in contact with anyone that has tested positive in the last 10 days please notify you surgeon.    [place] - Preparing for Surgery Before surgery, you can play an important role.  Because skin is not sterile, your skin needs to be as free of germs as possible.  You can reduce the number of germs on your skin by washing with CHG (chlorahexidine gluconate) soap before surgery.  CHG is an antiseptic cleaner which kills germs and bonds with the skin to continue killing germs even after washing. Please DO NOT use if you have an allergy to CHG or antibacterial soaps.  If your skin becomes reddened/irritated stop using the CHG and inform your nurse when you arrive at Short Stay. Do not shave (including legs and underarms) for at least 48 hours prior to the first CHG shower.  You may shave your face/neck.  Please follow these instructions carefully:  1.  Shower with CHG Soap the night before surgery and the  morning of surgery.  2.  If you  choose to wash your hair, wash your hair first as usual with your normal  shampoo.  3.  After you shampoo, rinse your hair and body thoroughly to remove the shampoo.                             4.  Use CHG as you would any other liquid soap.  You can apply chg directly to the skin and wash.  Gently with a scrungie or clean washcloth.  5.  Apply the CHG Soap to your body ONLY FROM THE NECK DOWN.   Do   not use on face/ open                           Wound or open sores. Avoid contact with eyes, ears mouth and   genitals (private parts).                       Wash face,  Genitals (private parts) with your normal soap.             6.  Wash thoroughly, paying special attention to the area where your    surgery  will be performed.  7.  Thoroughly rinse your body with warm water from the neck down.  8.  DO NOT shower/wash with your normal soap after using and rinsing off the CHG Soap.                9.  Pat yourself dry with a clean towel.            10.  Wear clean pajamas.            11.  Place clean sheets on your bed the night of your first shower and do not  sleep with pets. Day of Surgery : Do not apply any lotions/deodorants the morning of surgery.  Please wear clean clothes to the hospital/surgery center.  FAILURE TO FOLLOW THESE INSTRUCTIONS MAY RESULT IN THE CANCELLATION OF YOUR SURGERY  PATIENT SIGNATURE_________________________________  NURSE SIGNATURE__________________________________  ________________________________________________________________________

## 2023-03-23 NOTE — Plan of Care (Signed)
  Problem: Education: Goal: Knowledge of General Education information will improve Description: Including pain rating scale, medication(s)/side effects and non-pharmacologic comfort measures Outcome: Progressing   Problem: Clinical Measurements: Goal: Will remain free from infection Outcome: Progressing   Problem: Activity: Goal: Risk for activity intolerance will decrease Outcome: Progressing   Problem: Nutrition: Goal: Adequate nutrition will be maintained Outcome: Progressing   Problem: Elimination: Goal: Will not experience complications related to urinary retention Outcome: Progressing   Problem: Pain Management: Goal: General experience of comfort will improve Outcome: Progressing

## 2023-03-24 DIAGNOSIS — E8729 Other acidosis: Secondary | ICD-10-CM | POA: Diagnosis not present

## 2023-03-24 DIAGNOSIS — I700 Atherosclerosis of aorta: Secondary | ICD-10-CM | POA: Diagnosis not present

## 2023-03-24 DIAGNOSIS — F10921 Alcohol use, unspecified with intoxication delirium: Secondary | ICD-10-CM | POA: Diagnosis not present

## 2023-03-24 DIAGNOSIS — N179 Acute kidney failure, unspecified: Secondary | ICD-10-CM | POA: Diagnosis not present

## 2023-03-24 LAB — CBC
HCT: 42.3 % (ref 39.0–52.0)
Hemoglobin: 13.8 g/dL (ref 13.0–17.0)
MCH: 30.1 pg (ref 26.0–34.0)
MCHC: 32.6 g/dL (ref 30.0–36.0)
MCV: 92.4 fL (ref 80.0–100.0)
Platelets: 130 10*3/uL — ABNORMAL LOW (ref 150–400)
RBC: 4.58 MIL/uL (ref 4.22–5.81)
RDW: 15.3 % (ref 11.5–15.5)
WBC: 3.7 10*3/uL — ABNORMAL LOW (ref 4.0–10.5)
nRBC: 0 % (ref 0.0–0.2)

## 2023-03-24 LAB — COMPREHENSIVE METABOLIC PANEL
ALT: 44 U/L (ref 0–44)
AST: 39 U/L (ref 15–41)
Albumin: 3.3 g/dL — ABNORMAL LOW (ref 3.5–5.0)
Alkaline Phosphatase: 59 U/L (ref 38–126)
Anion gap: 6 (ref 5–15)
BUN: 8 mg/dL (ref 8–23)
CO2: 26 mmol/L (ref 22–32)
Calcium: 8.2 mg/dL — ABNORMAL LOW (ref 8.9–10.3)
Chloride: 101 mmol/L (ref 98–111)
Creatinine, Ser: 0.91 mg/dL (ref 0.61–1.24)
GFR, Estimated: >60 mL/min (ref >60)
Glucose, Bld: 117 mg/dL — ABNORMAL HIGH (ref 70–99)
Potassium: 3.4 mmol/L — ABNORMAL LOW (ref 3.5–5.1)
Sodium: 133 mmol/L — ABNORMAL LOW (ref 135–145)
Total Bilirubin: 0.7 mg/dL (ref <1.2)
Total Protein: 6.1 g/dL — ABNORMAL LOW (ref 6.5–8.1)

## 2023-03-24 LAB — GLUCOSE, CAPILLARY
Glucose-Capillary: 124 mg/dL — ABNORMAL HIGH (ref 70–99)
Glucose-Capillary: 117 mg/dL — ABNORMAL HIGH (ref 70–99)
Glucose-Capillary: 141 mg/dL — ABNORMAL HIGH (ref 70–99)
Glucose-Capillary: 96 mg/dL (ref 70–99)
Glucose-Capillary: 123 mg/dL — ABNORMAL HIGH (ref 70–99)

## 2023-03-24 LAB — MAGNESIUM: Magnesium: 2 mg/dL (ref 1.7–2.4)

## 2023-03-24 LAB — PHOSPHORUS: Phosphorus: 2.3 mg/dL — ABNORMAL LOW (ref 2.5–4.6)

## 2023-03-24 MED ORDER — POTASSIUM CHLORIDE CRYS ER 20 MEQ PO TBCR
40.0000 meq | EXTENDED_RELEASE_TABLET | ORAL | Status: AC
  Administered 2023-03-24 (×2): 40 meq via ORAL
  Filled 2023-03-24 (×2): qty 2

## 2023-03-24 NOTE — Progress Notes (Signed)
    Patient Name: Ruben [name]           DOB: 1951/03/05  MRN: [id number]      Admission Date: 03/22/2023  Attending Provider: Almon Hercules, MD  Primary Diagnosis: Alcoholic ketoacidosis   Level of care: Telemetry    CROSS COVER NOTE   Date of Service   03/24/2023   Ruben [name], 73 y.o. male, was admitted on 03/22/2023 for Alcoholic ketoacidosis.    HPI/Events of Note   Alcohol withdrawal Patient is currently on phenobarbital taper and is receiving as needed Ativan. Withdrawal symptoms appear to be worsening, Ativan use has increased. Bedside RN reports patient is frequently getting out of bed without assistance, wandering in the hall.  He is a high fall risk.  Recommendation was made for a Recruitment consultant, however none are available.   Interventions/ Plan   Tele sitter Posey belt restraint Continue monitoring withdrawal symptoms, use Ativan as needed based on CIWA score.        Anthoney Harada, DNP, ACNPC- AG [facility]

## 2023-03-24 NOTE — Progress Notes (Signed)
PROGRESS NOTE  Ruben [name] JYN:[id number] DOB: Jun 01, 1950   PCP: Soundra Pilon, FNP  Patient is from: Home.  Lives alone.  Independently ambulates at baseline  DOA: 03/22/2023 LOS: 2  Chief complaints Chief Complaint  Patient presents with   Emesis     Brief Narrative / Interim history: 72 year old M with PMH of alcohol abuse, recurrent hospitalization for alcohol withdrawal (6 times in the last 3 months) prediabetes, HTN, HLD, GERD, BPH and obesity presenting with nausea, vomiting and abdominal pain in the setting of alcohol intoxication and withdrawal, admitted for alcoholic ketoacidosis, AKI, alcohol intoxication and withdrawal.  EtOH level 267.  Creatinine 1.45.  Bicarb 12 with anion gap of  > 20.  Patient reports drinking about 5th bottle of vodka every day.  Patient has history of alcohol withdrawal seizure.  Patient was started on IV fluid, phenobarbital taper scheduled and as needed Ativan.   The next day, patient's symptoms improved and he was transferred out of stepdown unit.  Scheduled Ativan discontinued.  Currently on phenobarbital taper and as needed Ativan.  Subjective: Seen and examined earlier this morning.  Seems he had some delirium last night requiring abdominal restraints and as needed Ativan's.  He is CIWA was elevated to 16 about midnight.  Subsequent CIWA scores were 1 x 2.  Patient is sleepy but wakes to voice.  He is not quite alert.  He is oriented x 4 except the date.  He says his cousin put a wire on his face pointing at nasal cannula on his face.  Reports some nausea earlier that has improved.  Objective: Vitals:   03/24/23 0451 03/24/23 0745 03/24/23 1121 03/24/23 1436  BP: 120/77   122/82  Pulse: 77  81 72  Resp: 14 12  16   Temp: 97.9 F (36.6 C)   98.2 F (36.8 C)  TempSrc: Axillary   Oral  SpO2: 96%   96%  Weight:      Height:        Examination:  GENERAL: No apparent distress.  Nontoxic. HEENT: MMM.  Vision and hearing grossly intact.   NECK: Supple.  No apparent JVD.  RESP:  No IWOB.  Fair aeration bilaterally. CVS:  RRR. Heart sounds normal.  ABD/GI/GU: BS+. Abd soft, NTND.  MSK/EXT:  Moves extremities. No apparent deformity. No edema.  SKIN: no apparent skin lesion or wound NEURO: Awake but not quite alert.  Oriented x 4 except date.  No apparent focal neuro deficit. PSYCH: Somewhat sleepy.   Procedures:  None  Microbiology summarized: MRSA PCR screen nonreactive  Assessment and plan: Alcoholic ketoacidosis: Resolved. Severe alcohol use disorder: Drinks vodka.  Six admission in the last 3 months.  Tried multiple rehabs in the past. Alcohol withdrawal delirium.  Some delirium last night and this morning. -Continue phenobarbital taper and as needed Ativan -Continue abdominal restraints and TeleSitter for now -Continue multivitamin, thiamine and folic acid -Continue soft diet -Encouraged alcohol cessation -TOC consulted.    AKI/dehydration: Likely due to dehydration from alcohol.  Resolved. Recent Labs    02/09/23 0839 02/10/23 0234 02/26/23 0750 02/27/23 0318 02/28/23 0538 03/01/23 0556 03/22/23 0616 03/22/23 1843 03/23/23 0308 03/24/23 0446  BUN 14 9 15 13 13 15 15 11 10 8   CREATININE 1.15 0.96 1.43* 1.00 0.94 0.89 1.45* 1.08 1.03 0.91  -Continue soft diet.  Elevated LFTs/hyperbilirubinemia: Likely due to alcohol.  Resolved.   Essential hypertension:  BP slightly elevated.  Tachycardia resolved. -Resume home clonidine.  Takes at bedtime.  Increase to twice daily -Continue home metoprolol  Hypokalemia/hypophosphatemia -Monitor replenish as appropriate   Peripheral neuropathy -Continue home gabapentin at bedtime   Mood disorder/depression: Stable -Continue home meds   BPH without LUTS -Continue home Flomax and Proscar.   Hyponatremia: Mild.  Likely from alcohol. -Monitor   Overweight Body mass index is 29.79 kg/m.           DVT prophylaxis:  enoxaparin (LOVENOX) injection  40 mg Start: 03/22/23 2200  Code Status: Full code Family Communication: None at bedside Level of care: Telemetry Status is: Inpatient Remains inpatient appropriate because: Alcohol withdrawal   Final disposition: Home Consultants:  None  55 minutes with more than 50% spent in reviewing records, counseling patient/family and coordinating care.   Sch Meds:  Scheduled Meds:  Chlorhexidine Gluconate Cloth  6 each Topical Daily   enoxaparin (LOVENOX) injection  40 mg Subcutaneous Q24H   finasteride  5 mg Oral Daily   folic acid  1 mg Oral Daily   gabapentin  300 mg Oral BID   metoprolol tartrate  50 mg Oral BID   multivitamin with minerals  1 tablet Oral Daily   pantoprazole  40 mg Oral Daily   PHENobarbital  16.2 mg Oral BID   QUEtiapine  300 mg Oral QHS   tamsulosin  0.4 mg Oral QHS   thiamine  100 mg Oral Daily   Or   thiamine  100 mg Intravenous Daily   Continuous Infusions: PRN Meds:.acetaminophen **OR** acetaminophen, LORazepam **OR** LORazepam, ondansetron **OR** ondansetron (ZOFRAN) IV, mouth rinse  Antimicrobials: Anti-infectives (From admission, onward)    None        I have personally reviewed the following labs and images: CBC: Recent Labs  Lab 03/22/23 0616 03/23/23 0308 03/24/23 0446  WBC 13.7* 5.0 3.7*  HGB 16.9 14.9 13.8  HCT 50.9 45.7 42.3  MCV 91.7 91.8 92.4  PLT 263 145* 130*   BMP &GFR Recent Labs  Lab 03/22/23 0616 03/22/23 1843 03/23/23 0308 03/24/23 0446  NA 139 135 134* 133*  K 4.3 4.9 3.9 3.4*  CL 101 102 100 101  CO2 12* 24 26 26   GLUCOSE 92 118* 159* 117*  BUN 15 11 10 8   CREATININE 1.45* 1.08 1.03 0.91  CALCIUM 8.6* 7.9* 8.0* 8.2*  MG 2.4  --   --  2.0  PHOS  --   --   --  2.3*   Estimated Creatinine Clearance: 88.4 mL/min (by C-G formula based on SCr of 0.91 mg/dL). Liver & Pancreas: Recent Labs  Lab 03/22/23 0616 03/23/23 0308 03/24/23 0446  AST 43* 37 39  ALT 46* 41 44  ALKPHOS 75 62 59  BILITOT 0.7 1.1  0.7  PROT 7.7 6.5 6.1*  ALBUMIN 4.1 3.4* 3.3*   Recent Labs  Lab 03/22/23 0616  LIPASE 26   No results for input(s): "AMMONIA" in the last 168 hours. Diabetic: No results for input(s): "HGBA1C" in the last 72 hours. Recent Labs  Lab 03/23/23 1715 03/23/23 2109 03/24/23 0134 03/24/23 0725 03/24/23 1157  GLUCAP 100* 111* 124* 117* 141*   Cardiac Enzymes: No results for input(s): "CKTOTAL", "CKMB", "CKMBINDEX", "TROPONINI" in the last 168 hours. No results for input(s): "PROBNP" in the last 8760 hours. Coagulation Profile: No results for input(s): "INR", "PROTIME" in the last 168 hours. Thyroid Function Tests: No results for input(s): "TSH", "T4TOTAL", "FREET4", "T3FREE", "THYROIDAB" in the last 72 hours. Lipid Profile: No results for input(s): "CHOL", "HDL", "LDLCALC", "TRIG", "CHOLHDL", "LDLDIRECT" in the  last 72 hours. Anemia Panel: No results for input(s): "VITAMINB12", "FOLATE", "FERRITIN", "TIBC", "IRON", "RETICCTPCT" in the last 72 hours. Urine analysis:    Component Value Date/Time   COLORURINE YELLOW 03/23/2023 0627   APPEARANCEUR CLEAR 03/23/2023 0627   LABSPEC 1.013 03/23/2023 0627   PHURINE 5.0 03/23/2023 0627   GLUCOSEU 50 (A) 03/23/2023 0627   HGBUR NEGATIVE 03/23/2023 0627   BILIRUBINUR NEGATIVE 03/23/2023 0627   KETONESUR 20 (A) 03/23/2023 0627   PROTEINUR NEGATIVE 03/23/2023 0627   NITRITE NEGATIVE 03/23/2023 0627   LEUKOCYTESUR NEGATIVE 03/23/2023 0627   Sepsis Labs: Invalid input(s): "PROCALCITONIN", "LACTICIDVEN"  Microbiology: Recent Results (from the past 240 hours)  MRSA Next Gen by PCR, Nasal     Status: None   Collection Time: 03/22/23  3:05 PM   Specimen: Nasal Mucosa; Nasal Swab  Result Value Ref Range Status   MRSA by PCR Next Gen NOT DETECTED NOT DETECTED Final    Comment: (NOTE) The GeneXpert MRSA Assay (FDA approved for NASAL specimens only), is one component of a comprehensive MRSA colonization surveillance program. It is not  intended to diagnose MRSA infection nor to guide or monitor treatment for MRSA infections. Test performance is not FDA approved in patients less than 65 years old. Performed at [facility], [address] [place], [place] [address]     Radiology Studies: No results found.    Shaunte Weissinger T. Naaman Curro [facility]  If 7PM-7AM, please contact night-coverage [email or web address] 03/24/2023, 2:39 PM

## 2023-03-24 NOTE — Progress Notes (Signed)
Rapid Response Event Note   Reason for Call : pt has a CIWA 16   Initial Focused Assessment: pt lying in bed sideways, will arouse to voice.  Pt repositioned in bed.  Pt falling back to to sleep after stimulation.  Follow simple commands.  Breath sounds clear and decreased.  Per pt RN he has been very impulsive but currently pt is not.  Vitals see flowsheet. CBG 124   Interventions: 2 L [place] placed d/t sats 92%, pt sats recover to 99%.  RR 15. Mouth care given, also.   [facility], NP at bedside.  See orders.    Plan of Care: pt will remain in current location, RN to monitor closely and report.  Tele monitor placed.  Event Summary:   MD Notified: Yes  Conley Rolls, RN

## 2023-03-24 NOTE — Progress Notes (Signed)
PT Cancellation Note  Patient Details Name: Ruben [name] MRN: [id number] DOB: Feb 29, 1951   Cancelled Treatment:    Reason Eval/Treat Not Completed: Medical issues which prohibited therapy--currently going through withdrawal per chart review. secure chat with RN who recommends holding PT for now. will check back as schedule allows.    Faye Ramsay, PT Acute Rehabilitation  Office: [phone]

## 2023-03-25 DIAGNOSIS — F10921 Alcohol use, unspecified with intoxication delirium: Secondary | ICD-10-CM | POA: Diagnosis not present

## 2023-03-25 DIAGNOSIS — N179 Acute kidney failure, unspecified: Secondary | ICD-10-CM | POA: Diagnosis not present

## 2023-03-25 DIAGNOSIS — I10 Essential (primary) hypertension: Secondary | ICD-10-CM | POA: Diagnosis not present

## 2023-03-25 DIAGNOSIS — R000 Tachycardia, unspecified: Secondary | ICD-10-CM

## 2023-03-25 DIAGNOSIS — E8729 Other acidosis: Secondary | ICD-10-CM | POA: Diagnosis not present

## 2023-03-25 LAB — RENAL FUNCTION PANEL
Albumin: 3.4 g/dL — ABNORMAL LOW (ref 3.5–5.0)
Anion gap: 6 (ref 5–15)
BUN: 9 mg/dL (ref 8–23)
CO2: 23 mmol/L (ref 22–32)
Calcium: 8.3 mg/dL — ABNORMAL LOW (ref 8.9–10.3)
Chloride: 103 mmol/L (ref 98–111)
Creatinine, Ser: 0.91 mg/dL (ref 0.61–1.24)
GFR, Estimated: >60 mL/min (ref >60)
Glucose, Bld: 119 mg/dL — ABNORMAL HIGH (ref 70–99)
Phosphorus: 2.6 mg/dL (ref 2.5–4.6)
Potassium: 3.8 mmol/L (ref 3.5–5.1)
Sodium: 132 mmol/L — ABNORMAL LOW (ref 135–145)

## 2023-03-25 LAB — GLUCOSE, CAPILLARY
Glucose-Capillary: 106 mg/dL — ABNORMAL HIGH (ref 70–99)
Glucose-Capillary: 126 mg/dL — ABNORMAL HIGH (ref 70–99)

## 2023-03-25 LAB — CBC
HCT: 44 % (ref 39.0–52.0)
Hemoglobin: 14.4 g/dL (ref 13.0–17.0)
MCH: 30.3 pg (ref 26.0–34.0)
MCHC: 32.7 g/dL (ref 30.0–36.0)
MCV: 92.6 fL (ref 80.0–100.0)
Platelets: 123 10*3/uL — ABNORMAL LOW (ref 150–400)
RBC: 4.75 MIL/uL (ref 4.22–5.81)
RDW: 15.1 % (ref 11.5–15.5)
WBC: 4.1 10*3/uL (ref 4.0–10.5)
nRBC: 0 % (ref 0.0–0.2)

## 2023-03-25 LAB — MAGNESIUM: Magnesium: 2.1 mg/dL (ref 1.7–2.4)

## 2023-03-25 NOTE — Plan of Care (Signed)
  Problem: Education: Goal: Knowledge of General Education information will improve Description: Including pain rating scale, medication(s)/side effects and non-pharmacologic comfort measures Outcome: Adequate for Discharge   Problem: Health Behavior/Discharge Planning: Goal: Ability to manage health-related needs will improve Outcome: Adequate for Discharge   Problem: Clinical Measurements: Goal: Ability to maintain clinical measurements within normal limits will improve Outcome: Adequate for Discharge Goal: Will remain free from infection Outcome: Adequate for Discharge Goal: Diagnostic test results will improve Outcome: Adequate for Discharge Goal: Respiratory complications will improve Outcome: Adequate for Discharge Goal: Cardiovascular complication will be avoided Outcome: Adequate for Discharge   Problem: Activity: Goal: Risk for activity intolerance will decrease Outcome: Adequate for Discharge   Problem: Nutrition: Goal: Adequate nutrition will be maintained Outcome: Adequate for Discharge   Problem: Coping: Goal: Level of anxiety will decrease Outcome: Adequate for Discharge   Problem: Elimination: Goal: Will not experience complications related to bowel motility Outcome: Adequate for Discharge Goal: Will not experience complications related to urinary retention Outcome: Adequate for Discharge   Problem: Pain Management: Goal: General experience of comfort will improve Outcome: Adequate for Discharge   Problem: Safety: Goal: Ability to remain free from injury will improve Outcome: Adequate for Discharge   Problem: Skin Integrity: Goal: Risk for impaired skin integrity will decrease Outcome: Adequate for Discharge   Problem: Safety: Goal: Non-violent Restraint(s) Outcome: Adequate for Discharge

## 2023-03-25 NOTE — Evaluation (Signed)
Occupational Therapy Evaluation Patient Details Name: Ruben [name] MRN: [id number] DOB: December 25, 1950 Today's Date: 03/25/2023   History of Present Illness 72 yo male admitted with ETOH ketoacidosis, ETOH withdrawal, AKI. Hx of ETOH abuse/ETOH withdrawal (6 ED visits in last 3 months), obesity   Clinical Impression   Pt plans to DC this day and niece and sister will A as needed       If plan is discharge home, recommend the following: Assistance with cooking/housework    Functional Status Assessment  Patient has had a recent decline in their functional status and demonstrates the ability to make significant improvements in function in a reasonable and predictable amount of time.        Precautions / Restrictions Precautions Precautions: Fall Restrictions Weight Bearing Restrictions Per Provider Order: No      Mobility Bed Mobility Overal bed mobility: Independent                  Transfers Overall transfer level: Modified independent                        Balance Overall balance assessment: Mild deficits observed, not formally tested                                         ADL either performed or assessed with clinical judgement   ADL Overall ADL's : Modified independent                                       General ADL Comments: mod I with SIMPLE ADL activity. Pt has a dog that must be taken out.  Pt will need A with IADL activity - pt states niece can help at times.  Encouraged to to reach out to support system for support in terms of accountiblity and his goals.     Vision   Vision Assessment?: No apparent visual deficits            Pertinent Vitals/Pain Pain Assessment Pain Assessment: No/denies pain     Extremity/Trunk Assessment Upper Extremity Assessment Upper Extremity Assessment: Overall WFL for tasks assessed   Lower Extremity Assessment Lower Extremity Assessment: Generalized weakness    Cervical / Trunk Assessment Cervical / Trunk Assessment: Normal   Communication Communication Communication: No apparent difficulties   Cognition Arousal: Alert Behavior During Therapy: WFL for tasks assessed/performed Overall Cognitive Status: Within Functional Limits for tasks assessed                                 General Comments: pleasant. appropriate     General Comments               Home Living Family/patient expects to be discharged to:: Private residence Living Arrangements: Alone Available Help at Discharge: Family;Available PRN/intermittently Type of Home: House Home Access: Stairs to enter Entrance Stairs-Number of Steps: 1 Entrance Stairs-Rails: None Home Layout: One level               Home Equipment: Agricultural consultant (2 wheels)          Prior Functioning/Environment Prior Level of Function : Independent/Modified Independent  OT Goals(Current goals can be found in the care plan section) Acute Rehab OT Goals Patient Stated Goal: home today OT Goal Formulation: With patient         AM-PAC OT "6 Clicks" Daily Activity     Outcome Measure Help from another person eating meals?: None Help from another person taking care of personal grooming?: None Help from another person toileting, which includes using toliet, bedpan, or urinal?: None Help from another person bathing (including washing, rinsing, drying)?: None Help from another person to put on and taking off regular upper body clothing?: None Help from another person to put on and taking off regular lower body clothing?: None 6 Click Score: 24   End of Session Nurse Communication: Mobility status  Activity Tolerance: Patient tolerated treatment well Patient left: in chair;with call bell/phone within reach                   Time: 1205-1222 OT Time Calculation (min): 17 min Charges:  OT General Charges $OT Visit: 1 Visit OT Evaluation $OT Eval  Low Complexity: 1 Low    Luan Maberry, [place]ung 03/25/2023, 1:45 PM

## 2023-03-25 NOTE — Discharge Summary (Signed)
Physician Discharge Summary  Ruben [name] JXB:[id number] DOB: 12-21-1950 DOA: 03/22/2023  PCP: Soundra Pilon, FNP  Admit date: 03/22/2023 Discharge date: 03/25/2023  2:20 PM  Admitted From: Home Disposition: Home Recommendations for Outpatient Follow-up:  Follow up with PCP in 1 week Check CMP and CBC at follow-up Please follow up on the following pending results: None  Home Health: No need identified Equipment/Devices: No need identified  Discharge Condition: Stable CODE STATUS: Full code  Follow-up Information     Soundra Pilon, FNP. Schedule an appointment as soon as possible for a visit in 1 week(s).   Specialty: Family Medicine Contact information: [phone] [place] [address] [phone]                 Hospital course 72 year old M with PMH of alcohol abuse, recurrent hospitalization for alcohol withdrawal (6 times in the last 3 months) prediabetes, HTN, HLD, GERD, BPH and obesity presenting with nausea, vomiting and abdominal pain in the setting of alcohol intoxication and withdrawal, admitted for alcoholic ketoacidosis, AKI, alcohol intoxication and withdrawal.  EtOH level 267.  Creatinine 1.45.  Bicarb 12 with anion gap of  > 20.  Patient reports drinking about 5th bottle of vodka every day.  Patient has history of alcohol withdrawal seizure.  Patient was started on IV fluid, phenobarbital taper scheduled and as needed Ativan.   The next day, patient's symptoms improved and he was transferred out of stepdown unit.  Scheduled Ativan discontinued.  Continued on phenobarbital taper and as needed Ativan.   Patient had brief episode of delirium the night of 12/13-12/14 that has resolved the next day.  On the day of discharge, no withdrawal symptoms.  CIWA score 0 without  Ativan for over 24 hours.  Discharged to follow-up outpatient.  Extensively encouraged to quit drinking alcohol.  He is already on Ativan, clonidine and gabapentin at  home.  If patient needs to remain on benzodiazepines, I would consider long acting benzo such as Klonopin to minimize withdrawal symptoms.   See individual problem list below for more.   Problems addressed during this hospitalization Alcoholic ketoacidosis: Resolved. Severe alcohol use disorder: Drinks vodka.  Six admission in the last 3 months.  Tried multiple rehabs in the past. Alcohol withdrawal delirium: This resolved.  CIWA score 0 without Ativan for over 24 hours.  AKI/dehydration: Likely due to dehydration from alcohol.  Resolved.  Elevated LFTs/hyperbilirubinemia: Likely due to alcohol.  Resolved.   Essential hypertension: Normotensive. -Continue home medications.   Hypokalemia/hypophosphatemia: Resolved.   Peripheral neuropathy -Continue home gabapentin at bedtime   Mood disorder/depression: Stable -Continue home meds   BPH without LUTS -Continue home Flomax and Proscar.   Hyponatremia: Mild.  Likely from alcohol. -Recheck at follow-up.   Overweight Body mass index is 29.79 kg/m.             Time spent 35 minutes  Vital signs Vitals:   03/24/23 1121 03/24/23 1436 03/24/23 2006 03/25/23 0508  BP:  122/82 137/84 118/79  Pulse: 81 72 83 66  Temp:  98.2 F (36.8 C) 98 F (36.7 C) (!) 96.6 F (35.9 C)  Resp:  16 16 16   Height:      Weight:      SpO2:  96% 98% 94%  TempSrc:  Oral Oral Axillary  BMI (Calculated):         Discharge exam  GENERAL: No apparent distress.  Nontoxic. HEENT: MMM.  Vision and hearing grossly intact.  NECK: Supple.  No apparent JVD.  RESP:  No IWOB.  Fair aeration bilaterally. CVS:  RRR. Heart sounds normal.  ABD/GI/GU: BS+. Abd soft, NTND.  MSK/EXT:  Moves extremities. No apparent deformity. No edema.  SKIN: no apparent skin lesion or wound NEURO: Sleepy but wakes to voice.  Oriented x 4.  No apparent focal neuro deficit. PSYCH: Calm. Normal affect.   Discharge Instructions Discharge Instructions     Diet  general   Complete by: As directed    Discharge instructions   Complete by: As directed    It has been a pleasure taking care of you!  You were hospitalized due to alcohol intoxication and withdrawal for which you have been treated.  We strongly recommend you stop drinking alcohol.  Take medications as prescribed.  Follow-up with your primary care doctor in 1 to 2 weeks or sooner if needed.   Take care,   Increase activity slowly   Complete by: As directed       Allergies as of 03/25/2023   No Known Allergies      Medication List     TAKE these medications    cloNIDine 0.1 MG tablet Commonly known as: CATAPRES Take 1 tablet (0.1 mg total) by mouth 2 (two) times daily. What changed: when to take this   desvenlafaxine 50 MG 24 hr tablet Commonly known as: PRISTIQ Take 50 mg by mouth daily.   finasteride 5 MG tablet Commonly known as: PROSCAR Take 5 mg by mouth daily.   folic acid 1 MG tablet Commonly known as: FOLVITE Take 1 tablet (1 mg total) by mouth daily.   gabapentin 300 MG capsule Commonly known as: NEURONTIN Take 300 mg by mouth 2 (two) times daily.   hydrOXYzine 50 MG tablet Commonly known as: ATARAX Take 50 mg by mouth daily.   LORazepam 0.5 MG tablet Commonly known as: ATIVAN Take 0.5 mg by mouth every 8 (eight) hours as needed for anxiety.   metoprolol tartrate 50 MG tablet Commonly known as: LOPRESSOR Take 50 mg by mouth 2 (two) times daily.   Milk of Magnesia 2400 MG/30ML suspension Generic drug: magnesium hydroxide Take 30 mLs by mouth every other day.   QUEtiapine 300 MG tablet Commonly known as: SEROQUEL Take 300 mg by mouth at bedtime.   rosuvastatin 20 MG tablet Commonly known as: CRESTOR Take 20 mg by mouth every Monday, Wednesday, and Friday.   tamsulosin 0.4 MG Caps capsule Commonly known as: FLOMAX Take 0.4 mg by mouth at bedtime.        Consultations: None  Procedures/Studies:   DG ESOPHAGUS W DOUBLE CM  (HD) Result Date: 03/15/2023 CLINICAL DATA:  72 year old gentleman with history of prior Nissen fundoplication returns to radiology with history of difficulty swallowing pills. EXAM: ESOPHAGUS/BARIUM SWALLOW/TABLET STUDY TECHNIQUE: Combined double and single contrast examination was performed using effervescent crystals, high-density barium, and thin liquid barium. FLUOROSCOPY: Radiation Exposure Index (as provided by the fluoroscopic device): 24 mGy Kerma COMPARISON:  04/27/2018 FINDINGS: Swallowing: Appears normal. No vestibular penetration or aspiration seen. Pharynx: Prominent cricopharyngeal bar is noted. Esophagus: No fixed stenosis of the esophagus. Postsurgical changes of Nissen fundoplication again seen at the gastroesophageal junction. Esophageal motility: Moderately reduced esophageal dysmotility with mild esophageal spasm. Hiatal Hernia: None. Gastroesophageal reflux: Severe gastroesophageal reflux to the level of the upper esophagus. Ingested 13 mm barium tablet: Passed normally Other: None. IMPRESSION: 1. Moderate esophageal dysmotility. 2. Severe gastroesophageal reflux. Electronically Signed   By: Harrington Challenger.D.  On: 03/15/2023 12:23   DG Abd 1 View Result Date: 02/26/2023 CLINICAL DATA:  Constipation evaluation. EXAM: ABDOMEN - 1 VIEW COMPARISON:  10/01/2019. FINDINGS: The bowel gas pattern is non-obstructive.  No abnormal stool burden. No evidence of pneumoperitoneum, within the limitations of a supine film. No acute osseous abnormalities. The soft tissues are within normal limits. Surgical changes, devices, tubes and lines: None. IMPRESSION: *Nonobstructive bowel gas pattern.  No abnormal stool burden. Electronically Signed   By: Jules Schick M.D.   On: 02/26/2023 12:49   DG Chest Portable 1 View Result Date: 02/26/2023 CLINICAL DATA:  shob EXAM: PORTABLE CHEST 1 VIEW COMPARISON:  Chest x-ray 08/11/2022, CT chest 12/17/2022, chest x-ray 06/22/2020 FINDINGS: The heart and mediastinal  contours are unchanged. Poorly defined left hemidiaphragm and left cardiac border stable and likely due to overlying mediastinal fatty density as seen on CT chest. No focal consolidation. No pulmonary edema. Trace left pleural effusion not excluded. No right pleural effusion. No pneumothorax. No acute osseous abnormality. IMPRESSION: Poorly defined left hemidiaphragm and left cardiac border stable and likely due to overlying mediastinal fatty density as seen on CT chest. Trace left pleural effusion not excluded- recommend chest x-ray lateral view for further evaluation. Electronically Signed   By: Tish Frederickson M.D.   On: 02/26/2023 09:29       The results of significant diagnostics from this hospitalization (including imaging, microbiology, ancillary and laboratory) are listed below for reference.     Microbiology: Recent Results (from the past 240 hours)  MRSA Next Gen by PCR, Nasal     Status: None   Collection Time: 03/22/23  3:05 PM   Specimen: Nasal Mucosa; Nasal Swab  Result Value Ref Range Status   MRSA by PCR Next Gen NOT DETECTED NOT DETECTED Final    Comment: (NOTE) The GeneXpert MRSA Assay (FDA approved for NASAL specimens only), is one component of a comprehensive MRSA colonization surveillance program. It is not intended to diagnose MRSA infection nor to guide or monitor treatment for MRSA infections. Test performance is not FDA approved in patients less than 58 years old. Performed at [facility], [address] [place], [place] [address]      Labs:  CBC: Recent Labs  Lab 03/22/23 [id number] 03/23/23 0308 03/24/23 0446 03/25/23 0407  WBC 13.7* 5.0 3.7* 4.1  HGB 16.9 14.9 13.8 14.4  HCT 50.9 45.7 42.3 44.0  MCV 91.7 91.8 92.4 92.6  PLT 263 145* 130* 123*   BMP &GFR Recent Labs  Lab 03/22/23 0616 03/22/23 1843 03/23/23 0308 03/24/23 0446 03/25/23 0407  NA 139 135 134* 133* 132*  K 4.3 4.9 3.9 3.4* 3.8  CL 101 102 100 101 103  CO2 12*  24 26 26 23   GLUCOSE 92 118* 159* 117* 119*  BUN 15 11 10 8 9   CREATININE 1.45* 1.08 1.03 0.91 0.91  CALCIUM 8.6* 7.9* 8.0* 8.2* 8.3*  MG 2.4  --   --  2.0 2.1  PHOS  --   --   --  2.3* 2.6   Estimated Creatinine Clearance: 88.4 mL/min (by C-G formula based on SCr of 0.91 mg/dL). Liver & Pancreas: Recent Labs  Lab 03/22/23 0616 03/23/23 0308 03/24/23 0446 03/25/23 0407  AST 43* 37 39  --   ALT 46* 41 44  --   ALKPHOS 75 62 59  --   BILITOT 0.7 1.1 0.7  --   PROT 7.7 6.5 6.1*  --   ALBUMIN 4.1 3.4* 3.3*  3.4*   Recent Labs  Lab 03/22/23 0616  LIPASE 26   No results for input(s): "AMMONIA" in the last 168 hours. Diabetic: No results for input(s): "HGBA1C" in the last 72 hours. Recent Labs  Lab 03/24/23 1157 03/24/23 1659 03/24/23 2047 03/25/23 0737 03/25/23 1152  GLUCAP 141* 96 123* 106* 126*   Cardiac Enzymes: No results for input(s): "CKTOTAL", "CKMB", "CKMBINDEX", "TROPONINI" in the last 168 hours. No results for input(s): "PROBNP" in the last 8760 hours. Coagulation Profile: No results for input(s): "INR", "PROTIME" in the last 168 hours. Thyroid Function Tests: No results for input(s): "TSH", "T4TOTAL", "FREET4", "T3FREE", "THYROIDAB" in the last 72 hours. Lipid Profile: No results for input(s): "CHOL", "HDL", "LDLCALC", "TRIG", "CHOLHDL", "LDLDIRECT" in the last 72 hours. Anemia Panel: No results for input(s): "VITAMINB12", "FOLATE", "FERRITIN", "TIBC", "IRON", "RETICCTPCT" in the last 72 hours. Urine analysis:    Component Value Date/Time   COLORURINE YELLOW 03/23/2023 0627   APPEARANCEUR CLEAR 03/23/2023 0627   LABSPEC 1.013 03/23/2023 0627   PHURINE 5.0 03/23/2023 0627   GLUCOSEU 50 (A) 03/23/2023 0627   HGBUR NEGATIVE 03/23/2023 0627   BILIRUBINUR NEGATIVE 03/23/2023 0627   KETONESUR 20 (A) 03/23/2023 0627   PROTEINUR NEGATIVE 03/23/2023 0627   NITRITE NEGATIVE 03/23/2023 0627   LEUKOCYTESUR NEGATIVE 03/23/2023 0627   Sepsis Labs: Invalid  input(s): "PROCALCITONIN", "LACTICIDVEN"   SIGNED:  Almon Hercules, MD  [facility] 03/25/2023, 2:55 PM

## 2023-03-25 NOTE — TOC Transition Note (Addendum)
Transition of Care [facility]) - Discharge Note   Patient Details  Name: Ruben [name] MRN: [id number] Date of Birth: 1950/07/16  Transition of [facility]) CM/SW Contact:  Darleene Cleaver, LCSW Phone Number: 03/25/2023, 11:42 AM   Clinical Narrative:     Patient will be discharging back home, he is declining substance abuse resources.  TOC to sign off.  Final next level of care: Home/Self Care Barriers to Discharge: Barriers Resolved   Patient Goals and CMS Choice Patient states their goals for this hospitalization and ongoing recovery are:: To return back home. CMS Medicare.gov Compare Post Acute Care list provided to:: Patient   [employer] ownership interest in [facility].provided to:: Patient    Discharge Placement                    Patient and family notified of of transfer: 03/25/23  Discharge Plan and Services Additional resources added to the After Visit Summary for     Discharge Planning Services: CM Consult                                 Social Drivers of Health (SDOH) Interventions SDOH Screenings   Food Insecurity: No Food Insecurity (03/23/2023)  Housing: Low Risk  (03/23/2023)  Transportation Needs: No Transportation Needs (03/23/2023)  Utilities: Not At Risk (03/23/2023)  Tobacco Use: Low Risk  (03/22/2023)     Readmission Risk Interventions    03/23/2023   10:01 AM 02/27/2023    4:15 PM 02/08/2023    2:47 PM  Readmission Risk Prevention Plan  Transportation Screening Complete Complete Complete  PCP or Specialist Appt within 3-5 Days Complete Complete Complete  HRI or Home Care Consult Complete Complete Complete  Social Work Consult for Recovery Care Planning/Counseling Complete Complete   Palliative Care Screening Not Applicable Not Applicable Not Applicable  Medication Review Oceanographer) Complete Complete Complete

## 2023-03-25 NOTE — Evaluation (Addendum)
Physical Therapy Evaluation Patient Details Name: Ruben [name] MRN: [id number] DOB: 01/14/1951 Today's Date: 03/25/2023  History of Present Illness  72 yo male admitted with ETOH ketoacidosis, ETOH withdrawal, AKI. Hx of ETOH abuse/ETOH withdrawal (6 ED visits in last 3 months), obesity  Clinical Impression  On eval, pt was Supervision-CGA for mobility. He walked ~850 feet around the unit. Also had pt perform a few high level balance tasks as well. No overt LOB but he is unsteady at times. Pt is eager to d/c home on today. Do not anticipate any f/u PT needs. He has access to a walker at home if needed. Will continue to follow pt during hospital stay. Recommend daily hallway ambulation with nursing or mobility team supervision.         If plan is discharge home, recommend the following: Assist for transportation;Help with stairs or ramp for entrance   Can travel by private vehicle        Equipment Recommendations None recommended by PT (already has walker at home)  Recommendations for Other Services       Functional Status Assessment       Precautions / Restrictions Precautions Precautions: Fall Restrictions Weight Bearing Restrictions Per Provider Order: No      Mobility  Bed Mobility Overal bed mobility: Independent                  Transfers Overall transfer level: Modified independent                      Ambulation/Gait Ambulation/Gait assistance: Supervision; Contact Guard Assist Gait Distance (Feet): 850 Feet Assistive device: None Gait Pattern/deviations: Step-through pattern, Decreased stride length       General Gait Details: intermittent unsteadiness but no overt LOB. tolerated distance well. mild lightheadedness.  Stairs            Wheelchair Mobility     Tilt Bed    Modified Rankin (Stroke Patients Only)       Balance Overall balance assessment: Needs assistance                           High level  balance activites: Backward walking, Turns, Sudden stops High Level Balance Comments: performed about without significant difficulty. no overt lob or near falls.             Pertinent Vitals/Pain Pain Assessment Pain Assessment: No/denies pain    Home Living Family/patient expects to be discharged to:: Private residence Living Arrangements: Alone Available Help at Discharge: Family;Available PRN/intermittently Type of Home: House Home Access: Stairs to enter Entrance Stairs-Rails: None Entrance Stairs-Number of Steps: 1   Home Layout: One level Home Equipment: Agricultural consultant (2 wheels)      Prior Function Prior Level of Function : Independent/Modified Independent                     Extremity/Trunk Assessment   Upper Extremity Assessment Upper Extremity Assessment: Overall WFL for tasks assessed    Lower Extremity Assessment Lower Extremity Assessment: Generalized weakness    Cervical / Trunk Assessment Cervical / Trunk Assessment: Normal  Communication   Communication Communication: No apparent difficulties  Cognition Arousal: Alert Behavior During Therapy: WFL for tasks assessed/performed Overall Cognitive Status: Within Functional Limits for tasks assessed  General Comments: pleasant. appropriate        General Comments      Exercises     Assessment/Plan    PT Assessment    PT Problem List         PT Treatment Interventions      PT Goals (Current goals can be found in the Care Plan section)  Acute Rehab PT Goals Patient Stated Goal: home today PT Goal Formulation: With patient Time For Goal Achievement: 04/08/23 Potential to Achieve Goals: Good    Frequency       Co-evaluation               AM-PAC PT "6 Clicks" Mobility  Outcome Measure Help needed turning from your back to your side while in a flat bed without using bedrails?: None Help needed moving from lying on your  back to sitting on the side of a flat bed without using bedrails?: None Help needed moving to and from a bed to a chair (including a wheelchair)?: None Help needed standing up from a chair using your arms (e.g., wheelchair or bedside chair)?: None Help needed to walk in hospital room?: A Little Help needed climbing 3-5 steps with a railing? : A Little 6 Click Score: 22    End of Session Equipment Utilized During Treatment: Gait belt Activity Tolerance: Patient tolerated treatment well Patient left: in bed;with call bell/phone within reach   PT Visit Diagnosis: Unsteadiness on feet (R26.81)    Time: 1610-9604 PT Time Calculation (min) (ACUTE ONLY): 17 min   Charges:   PT Evaluation $PT Eval Low Complexity: 1 Low   PT General Charges $$ ACUTE PT VISIT: 1 Visit            Faye Ramsay, PT Acute Rehabilitation  Office: [phone]

## 2023-03-26 ENCOUNTER — Other Ambulatory Visit: Payer: Self-pay

## 2023-03-26 ENCOUNTER — Encounter (HOSPITAL_COMMUNITY): Payer: Self-pay

## 2023-03-26 ENCOUNTER — Encounter (HOSPITAL_COMMUNITY)
Admission: RE | Admit: 2023-03-26 | Discharge: 2023-03-26 | Disposition: A | Discharge status: Home / Self Care | Payer: Medicare HMO | Source: Ambulatory Visit | Attending: Urology

## 2023-03-26 DIAGNOSIS — Z01818 Encounter for other preprocedural examination: Secondary | ICD-10-CM | POA: Diagnosis not present

## 2023-03-26 HISTORY — DX: Other psychoactive substance abuse, uncomplicated: F19.10

## 2023-03-26 NOTE — Progress Notes (Signed)
Nurse from pre surgical testing called over to make me aware that, this patient did not show for his PST visit and that when she called him he seemed to be drunk.  She stated she did not think he understood all of his instructions.  Patient told her he was in the hospital over the weekend for the same issue.  I called over to [facility] with Dr Greer Ee office to make her aware of the most recent admission and the possible current condition of the patient.  Pam stated she will make dr eskridge aware.

## 2023-03-26 NOTE — Anesthesia Preprocedure Evaluation (Addendum)
Anesthesia Evaluation  Patient identified by MRN, date of birth, ID band Patient awake    Reviewed: Allergy & Precautions, H&P , NPO status , Patient's Chart, lab work & pertinent test results, reviewed documented beta blocker date and time   Airway Mallampati: III  TM Distance: >3 FB Neck ROM: Full    Dental  (+) Chipped, Dental Advisory Given,    Pulmonary neg pulmonary ROS   Pulmonary exam normal breath sounds clear to auscultation       Cardiovascular hypertension, Pt. on medications and Pt. on home beta blockers + CAD  Normal cardiovascular exam+ Valvular Problems/Murmurs MR  Rhythm:Regular Rate:Normal     Neuro/Psych  PSYCHIATRIC DISORDERS Anxiety Depression    negative neurological ROS     GI/Hepatic hiatal hernia, PUD,GERD  Medicated,,(+)     substance abuse  alcohol use  Endo/Other  negative endocrine ROS    Renal/GU Renal disease  negative genitourinary   Musculoskeletal  (+) Arthritis , Osteoarthritis,    Abdominal   Peds  Hematology  (+) Blood dyscrasia, anemia Lab Results      Component                Value               Date                      WBC                      4.1                 03/25/2023                HGB                      14.4                03/25/2023                HCT                      44.0                03/25/2023                MCV                      92.6                03/25/2023                PLT                      123 (L)             03/25/2023              Anesthesia Other Findings   Reproductive/Obstetrics negative OB ROS                             Anesthesia Physical Anesthesia Plan  ASA: 3  Anesthesia Plan: General   Post-op Pain Management: Tylenol PO (pre-op)*   Induction: Intravenous  PONV Risk Score and Plan: 3 and Ondansetron, Dexamethasone and Treatment may vary due to age or medical condition  Airway Management  Planned: Oral ETT  Additional Equipment:  Intra-op Plan:   Post-operative Plan: Extubation in OR  Informed Consent: I have reviewed the patients History and Physical, chart, labs and discussed the procedure including the risks, benefits and alternatives for the proposed anesthesia with the patient or authorized representative who has indicated his/her understanding and acceptance.     Dental advisory given  Plan Discussed with: CRNA  Anesthesia Plan Comments: (See PAT note from 12/16 by Sherlie Ban PA-C )        Anesthesia Quick Evaluation

## 2023-03-26 NOTE — Progress Notes (Signed)
DISCUSSION: Ruben [name] is a 72 yo male who is being evaluated prior to TRANSURETHRAL WATERJET ABLATION OF PROSTATE on 03/27/23 with Dr. Mena Goes. PMH of HTN, aortic atherosclerosis, severe OSA (uses CPAP), severe ETOH abuse, GERD, hiatal hernia, BPH c/b urinary retention and AKI requiring foley in the past, anxiety, depression.   Patient has had multiple ED visits and admissions for ETOH intoxication c/b withdrawal (6 times in the last 3 months). Surgery has had to be rescheduled twice due to being admitted. Most recent admission was from 12/12-12/15. Delirium noted. Patient has history of alcohol withdrawal seizure. Patient reports drinking about 5th bottle of vodka every day and reported to PAT RN during phone dall that he was drinking. Dr. Estil Daft office made aware.   Patient follows with Cardiology for hx of aortic atherosclerosis and DOE. Last seen on 05/11/22. He underwent a stress test and echo. Echo showed normal EF with no significant valvular disease. Stress test did not show ischemia but was intermediate risk. Due to ongoing symptoms he had a cardiac cath on 05/16/22 which showed no CAD.    Patient follows with Pulmonology for OSA and DOE. Last seen on 01/25/23. Not consistently using CPAP which also showed significant air leak. SOB/DOE felt to be due to deconditioning with element of restriction due to body habitus. Advised fu in 6 months.  VS: There were no vitals taken for this visit.  PROVIDERS: Soundra Pilon, FNP Cardiology: Truett Mainland, MD Pulmonology: Durel Salts, MD   LABS: Labs reviewed: Acceptable for surgery. (all labs ordered are listed, but only abnormal results are displayed)  Labs Reviewed - No data to display   IMAGES:  CT Chest/Abd/Pelvis 12/17/22:   IMPRESSION: 1. Distended urinary bladder (545 mL) and prostatomegaly with chronic lobular prostate encroachment on the base of the bladder. Query urinary retention and/or bladder outlet obstruction.   2.  Atelectasis. No other acute or inflammatory process identified in the noncontrast chest, abdomen, or pelvis.   3. Chronic gastric hiatal hernia, hepatic steatosis, Aortic Atherosclerosis (ICD10-I70.0).  EKG:   CV: Left heart cath 05/16/22:  Angiographically normal coronary arteries No significant coronary artery disease Normal LVEDP Normal LVEF   Consider alternate caused for exertional dyspnea   Mobile cardiac telemetry 7 days 04/13/2022 - 04/20/2022: Dominant rhythm: Sinus. HR 47-121 bpm. Avg HR 66 bpm, in sinus rhythm. 5 episodes of probable atrial tachycardia, fastest at 171 bpm for 5 beats, longest for 13.8 secs at 137 bpm. <1% isolated SVE, couplets. 0 episodes of VT. <1% isolated VE, no couplet/triplets. No atrial fibrillation/atrial flutter/VT/high grade AV block, sinus pause >3sec noted. 0 patient triggered events.    Echo 04/27/22:  Study Quality: Poor  Normal LV systolic function with visual EF 60-65%. Left ventricle cavity is normal in size. Mild concentric hypertrophy of the left ventricle.  Normal global wall motion. Normal diastolic filling pattern, normal LAP.  No significant valvular heart disease.  Compared to 08/26/2020 G1DD is now normal otherwise no significant change.    Exercise nuclear stress test 04/25/2022: Myocardial perfusion reveals mild diaphragmatic attenuation in the inferior wall. No ischemia.  Overall LV systolic function is moderately abnormal with global hypokinesis. Stress LV EF: 39%. The LV cavity being small in rest and stress could be contributing to error in calculation.  Assess LVEF with echocardiogram.   Normal ECG stress. The patient exercised for 2 minutes and 36 seconds of a Bruce protocol, achieving approximately 4.64 METs & 95% MPHR. Stress terminated due to fatigue and THR achieved.  Markedly reduced exercise tolerance. The blood pressure response was normal. No previous exam available for comparison. Intermediate risk study due to  reduced LVEF.   Past Medical History:  Diagnosis Date   Alcoholism (HCC)    Anxiety    Aortic atherosclerosis (HCC) 12/26/2016   Noted on CT   Arthritis    BPH (benign prostatic hyperplasia)    Chronic constipation    takes magnesium   Depression    Duodenal ulcer    Dyspnea 12/17/2017   ED (erectile dysfunction)    GERD (gastroesophageal reflux disease)    Heart palpitations    Occ   Hiatal hernia    History of adenomatous polyp of colon    tubular adenoma's 2005   History of Helicobacter pylori infection    2008   Hypercholesteremia    Hypertension    MR (mitral regurgitation)    Mild, noted on ECHO   Palpitations    Pre-diabetes    Pulmonary nodule 12/26/2016   noted on CT, 5 x 3 mm nodular opacity right upper lobe   Rosacea    Schatzki's ring    last dilated 02/ 2016   Substance abuse (HCC)    Urinary retention    history of   Varicocele 02/25/2014   Small to moderate left varicocele    Past Surgical History:  Procedure Laterality Date   BALLOON DILATION N/A 06/08/2014   Procedure: BALLOON DILATION;  Surgeon: Charolett Bumpers, MD;  Location: [facility];  Service: Endoscopy;  Laterality: N/A;   COLONOSCOPY WITH PROPOFOL N/A 06/08/2014   Procedure: COLONOSCOPY WITH PROPOFOL;  Surgeon: Charolett Bumpers, MD;  Location: [facility];  Service: Endoscopy;  Laterality: N/A;   ESOPHAGOGASTRODUODENOSCOPY N/A 06/08/2014   Procedure: ESOPHAGOGASTRODUODENOSCOPY (EGD);  Surgeon: Charolett Bumpers, MD;  Location: [facility];  Service: Endoscopy;  Laterality: N/A;   ESOPHAGOGASTRODUODENOSCOPY (EGD) WITH ESOPHAGEAL DILATION N/A 12/31/2012   Procedure: ESOPHAGOGASTRODUODENOSCOPY (EGD) WITH ESOPHAGEAL DILATION;  Surgeon: Charolett Bumpers, MD;  Location: [facility];  Service: Endoscopy;  Laterality: N/A;   GREEN LIGHT LASER TURP (TRANSURETHRAL RESECTION OF PROSTATE N/A 12/28/2015   Procedure: GREEN LIGHT LASER TURP (TRANSURETHRAL RESECTION OF PROSTATE;  Surgeon: Jerilee Field, MD;  Location: [facility];  Service: Urology;  Laterality: N/A;   HERNIA REPAIR  04/2018   hiatal   LEFT HEART CATH AND CORONARY ANGIOGRAPHY N/A 05/16/2022   Procedure: LEFT HEART CATH AND CORONARY ANGIOGRAPHY;  Surgeon: Elder Negus, MD;  Location: [facility];  Service: Cardiovascular;  Laterality: N/A;    MEDICATIONS:  cloNIDine (CATAPRES) 0.1 MG tablet   desvenlafaxine (PRISTIQ) 50 MG 24 hr tablet   finasteride (PROSCAR) 5 MG tablet   folic acid (FOLVITE) 1 MG tablet   gabapentin (NEURONTIN) 300 MG capsule   hydrOXYzine (ATARAX) 50 MG tablet   LORazepam (ATIVAN) 0.5 MG tablet   magnesium hydroxide (MILK OF MAGNESIA) 400 MG/5ML suspension   metoprolol tartrate (LOPRESSOR) 50 MG tablet   QUEtiapine (SEROQUEL) 300 MG tablet   rosuvastatin (CRESTOR) 20 MG tablet   tamsulosin (FLOMAX) 0.4 MG CAPS capsule   No current facility-administered medications for this encounter.   Marcille Blanco [facility] Short Stay/Anesthesiology [facility] Phone [phone] 03/26/2023 11:56 AM

## 2023-03-26 NOTE — Progress Notes (Signed)
Reached pt. at home by phone to go over pre op instructions.Pt. verbalized understanding instructions. Did hear pt. Drinking/gulping a liquid. Pt. Did mess up some words when speaking.

## 2023-03-27 ENCOUNTER — Encounter (HOSPITAL_COMMUNITY)
Admission: RE | Disposition: A | Discharge status: Home / Self Care | Payer: Self-pay | Source: Ambulatory Visit | Attending: Urology

## 2023-03-27 ENCOUNTER — Encounter (HOSPITAL_COMMUNITY): Payer: Self-pay | Admitting: Urology

## 2023-03-27 ENCOUNTER — Observation Stay (HOSPITAL_COMMUNITY)
Admission: RE | Admit: 2023-03-27 | Discharge: 2023-03-28 | Disposition: A | Discharge status: Home / Self Care | Payer: Medicare HMO | Source: Ambulatory Visit | Attending: Urology | Admitting: Urology

## 2023-03-27 ENCOUNTER — Ambulatory Visit (HOSPITAL_COMMUNITY): Payer: Medicare HMO | Admitting: Physician Assistant

## 2023-03-27 ENCOUNTER — Ambulatory Visit (HOSPITAL_COMMUNITY): Payer: Medicare HMO | Admitting: Anesthesiology

## 2023-03-27 DIAGNOSIS — N401 Enlarged prostate with lower urinary tract symptoms: Secondary | ICD-10-CM

## 2023-03-27 DIAGNOSIS — Z79899 Other long term (current) drug therapy: Secondary | ICD-10-CM | POA: Diagnosis not present

## 2023-03-27 DIAGNOSIS — I10 Essential (primary) hypertension: Secondary | ICD-10-CM | POA: Insufficient documentation

## 2023-03-27 DIAGNOSIS — R350 Frequency of micturition: Secondary | ICD-10-CM | POA: Insufficient documentation

## 2023-03-27 DIAGNOSIS — R3912 Poor urinary stream: Secondary | ICD-10-CM | POA: Insufficient documentation

## 2023-03-27 DIAGNOSIS — N400 Enlarged prostate without lower urinary tract symptoms: Principal | ICD-10-CM | POA: Diagnosis present

## 2023-03-27 DIAGNOSIS — I2510 Atherosclerotic heart disease of native coronary artery without angina pectoris: Secondary | ICD-10-CM | POA: Diagnosis not present

## 2023-03-27 SURGERY — ABLATION, PROSTATE, TRANSURETHRAL, USING WATERJET
Anesthesia: General | Site: Prostate

## 2023-03-27 MED ORDER — OXYCODONE HCL 5 MG PO TABS
5.0000 mg | ORAL_TABLET | ORAL | Status: DC | PRN
  Administered 2023-03-27 – 2023-03-28 (×2): 5 mg via ORAL
  Filled 2023-03-27: qty 1

## 2023-03-27 MED ORDER — OXYCODONE HCL 5 MG PO TABS
ORAL_TABLET | ORAL | Status: AC
  Filled 2023-03-27: qty 1

## 2023-03-27 MED ORDER — HYDROMORPHONE HCL 1 MG/ML IJ SOLN
INTRAMUSCULAR | Status: AC
  Filled 2023-03-27: qty 2

## 2023-03-27 MED ORDER — ACETAMINOPHEN 500 MG PO TABS
1000.0000 mg | ORAL_TABLET | Freq: Four times a day (QID) | ORAL | Status: DC
  Administered 2023-03-27 – 2023-03-28 (×3): 1000 mg via ORAL
  Filled 2023-03-27 (×3): qty 2

## 2023-03-27 MED ORDER — CEFAZOLIN SODIUM-DEXTROSE 1-4 GM/50ML-% IV SOLN
1.0000 g | Freq: Three times a day (TID) | INTRAVENOUS | Status: AC
  Administered 2023-03-27 – 2023-03-28 (×2): 1 g via INTRAVENOUS
  Filled 2023-03-27 (×6): qty 50

## 2023-03-27 MED ORDER — FENTANYL CITRATE PF 50 MCG/ML IJ SOSY
PREFILLED_SYRINGE | INTRAMUSCULAR | Status: AC
  Filled 2023-03-27: qty 2

## 2023-03-27 MED ORDER — DEXAMETHASONE SODIUM PHOSPHATE 10 MG/ML IJ SOLN
INTRAMUSCULAR | Status: AC
  Filled 2023-03-27: qty 1

## 2023-03-27 MED ORDER — MIDAZOLAM HCL 2 MG/2ML IJ SOLN
2.0000 mg | Freq: Once | INTRAMUSCULAR | Status: AC
Start: 2023-03-27 — End: 2023-03-27
  Administered 2023-03-27: 2 mg via INTRAVENOUS

## 2023-03-27 MED ORDER — FENTANYL CITRATE PF 50 MCG/ML IJ SOSY
PREFILLED_SYRINGE | INTRAMUSCULAR | Status: AC
  Filled 2023-03-27: qty 1

## 2023-03-27 MED ORDER — CLONIDINE HCL 0.1 MG PO TABS
0.1000 mg | ORAL_TABLET | Freq: Every day | ORAL | Status: DC
  Administered 2023-03-27: 0.1 mg via ORAL
  Filled 2023-03-27: qty 1

## 2023-03-27 MED ORDER — OXYBUTYNIN CHLORIDE 5 MG PO TABS: 5.0000 mg | ORAL_TABLET | Freq: Two times a day (BID) | ORAL | 0 refills | Status: DC | PRN

## 2023-03-27 MED ORDER — ACETAMINOPHEN 500 MG PO TABS
1000.0000 mg | ORAL_TABLET | Freq: Once | ORAL | Status: AC
  Administered 2023-03-27: 1000 mg via ORAL
  Filled 2023-03-27: qty 2

## 2023-03-27 MED ORDER — ORAL CARE MOUTH RINSE: 15.0000 mL | Freq: Once | OROMUCOSAL | Status: AC

## 2023-03-27 MED ORDER — PROPOFOL 10 MG/ML IV BOLUS
INTRAVENOUS | Status: AC
  Filled 2023-03-27: qty 20

## 2023-03-27 MED ORDER — ADULT MULTIVITAMIN W/MINERALS CH
1.0000 | ORAL_TABLET | Freq: Every day | ORAL | Status: DC
  Administered 2023-03-27 – 2023-03-28 (×2): 1 via ORAL
  Filled 2023-03-27 (×2): qty 1

## 2023-03-27 MED ORDER — MIDAZOLAM HCL 2 MG/2ML IJ SOLN
INTRAMUSCULAR | Status: AC
  Filled 2023-03-27: qty 2

## 2023-03-27 MED ORDER — OXYBUTYNIN CHLORIDE 5 MG PO TABS: 5.0000 mg | ORAL_TABLET | Freq: Three times a day (TID) | ORAL | Status: DC | PRN

## 2023-03-27 MED ORDER — THIAMINE MONONITRATE 100 MG PO TABS
100.0000 mg | ORAL_TABLET | Freq: Every day | ORAL | Status: DC
  Administered 2023-03-27 – 2023-03-28 (×2): 100 mg via ORAL
  Filled 2023-03-27 (×2): qty 1

## 2023-03-27 MED ORDER — AMISULPRIDE (ANTIEMETIC) 5 MG/2ML IV SOLN
INTRAVENOUS | Status: AC
  Filled 2023-03-27: qty 2

## 2023-03-27 MED ORDER — CHLORHEXIDINE GLUCONATE 0.12 % MT SOLN
15.0000 mL | Freq: Once | OROMUCOSAL | Status: AC
  Administered 2023-03-27: 15 mL via OROMUCOSAL

## 2023-03-27 MED ORDER — CEPHALEXIN 500 MG PO CAPS: 500.0000 mg | ORAL_CAPSULE | Freq: Every day | ORAL | 0 refills | Status: DC

## 2023-03-27 MED ORDER — FENTANYL CITRATE (PF) 100 MCG/2ML IJ SOLN
INTRAMUSCULAR | Status: AC
  Filled 2023-03-27: qty 2

## 2023-03-27 MED ORDER — PHENAZOPYRIDINE HCL 200 MG PO TABS
200.0000 mg | ORAL_TABLET | Freq: Three times a day (TID) | ORAL | Status: DC
  Administered 2023-03-28 (×2): 200 mg via ORAL
  Filled 2023-03-27: qty 2
  Filled 2023-03-27 (×2): qty 1
  Filled 2023-03-27: qty 2

## 2023-03-27 MED ORDER — METOPROLOL TARTRATE 25 MG PO TABS
50.0000 mg | ORAL_TABLET | Freq: Once | ORAL | Status: AC
  Administered 2023-03-27: 50 mg via ORAL
  Filled 2023-03-27: qty 2

## 2023-03-27 MED ORDER — PHENYLEPHRINE 80 MCG/ML (10ML) SYRINGE FOR IV PUSH (FOR BLOOD PRESSURE SUPPORT)
PREFILLED_SYRINGE | INTRAVENOUS | Status: AC
  Filled 2023-03-27: qty 10

## 2023-03-27 MED ORDER — THIAMINE HCL 100 MG/ML IJ SOLN: 100.0000 mg | Freq: Every day | INTRAMUSCULAR | Status: DC

## 2023-03-27 MED ORDER — SODIUM CHLORIDE 0.9 % IR SOLN
Status: DC | PRN
  Administered 2023-03-27: 15000 mL

## 2023-03-27 MED ORDER — LIDOCAINE HCL (PF) 2 % IJ SOLN
INTRAMUSCULAR | Status: AC
  Filled 2023-03-27: qty 5

## 2023-03-27 MED ORDER — LIDOCAINE HCL (CARDIAC) PF 100 MG/5ML IV SOSY
PREFILLED_SYRINGE | INTRAVENOUS | Status: DC | PRN
  Administered 2023-03-27: 60 mg via INTRAVENOUS

## 2023-03-27 MED ORDER — DEXAMETHASONE SODIUM PHOSPHATE 10 MG/ML IJ SOLN
INTRAMUSCULAR | Status: DC | PRN
  Administered 2023-03-27: 8 mg via INTRAVENOUS

## 2023-03-27 MED ORDER — AMISULPRIDE (ANTIEMETIC) 5 MG/2ML IV SOLN
5.0000 mg | Freq: Once | INTRAVENOUS | Status: AC
Start: 2023-03-27 — End: 2023-03-27
  Administered 2023-03-27: 5 mg via INTRAVENOUS

## 2023-03-27 MED ORDER — FENTANYL CITRATE (PF) 100 MCG/2ML IJ SOLN
INTRAMUSCULAR | Status: DC | PRN
  Administered 2023-03-27 (×2): 50 ug via INTRAVENOUS

## 2023-03-27 MED ORDER — METOPROLOL TARTRATE 50 MG PO TABS
50.0000 mg | ORAL_TABLET | Freq: Two times a day (BID) | ORAL | Status: DC
  Administered 2023-03-27 – 2023-03-28 (×2): 50 mg via ORAL
  Filled 2023-03-27 (×2): qty 1

## 2023-03-27 MED ORDER — TRANEXAMIC ACID-NACL 1000-0.7 MG/100ML-% IV SOLN
1000.0000 mg | INTRAVENOUS | Status: AC
  Administered 2023-03-27: 1000 mg via INTRAVENOUS
  Filled 2023-03-27: qty 100

## 2023-03-27 MED ORDER — LORAZEPAM 2 MG/ML IJ SOLN
1.0000 mg | INTRAMUSCULAR | Status: DC | PRN
Start: 2023-03-27 — End: 2023-03-30
  Administered 2023-03-28: 1 mg via INTRAVENOUS
  Filled 2023-03-27: qty 1

## 2023-03-27 MED ORDER — FOLIC ACID 1 MG PO TABS
1.0000 mg | ORAL_TABLET | Freq: Every day | ORAL | Status: DC
  Administered 2023-03-27 – 2023-03-28 (×2): 1 mg via ORAL
  Filled 2023-03-27 (×2): qty 1

## 2023-03-27 MED ORDER — PROPOFOL 10 MG/ML IV BOLUS
INTRAVENOUS | Status: DC | PRN
  Administered 2023-03-27: 180 mg via INTRAVENOUS

## 2023-03-27 MED ORDER — HYDROMORPHONE HCL 1 MG/ML IJ SOLN
0.5000 mg | INTRAMUSCULAR | Status: AC
  Administered 2023-03-27 (×4): 0.5 mg via INTRAVENOUS

## 2023-03-27 MED ORDER — FENTANYL CITRATE PF 50 MCG/ML IJ SOSY
25.0000 ug | PREFILLED_SYRINGE | INTRAMUSCULAR | Status: DC | PRN
Start: 2023-03-27 — End: 2023-03-27
  Administered 2023-03-27 (×3): 50 ug via INTRAVENOUS

## 2023-03-27 MED ORDER — ONDANSETRON HCL 4 MG/2ML IJ SOLN
INTRAMUSCULAR | Status: DC | PRN
  Administered 2023-03-27: 4 mg via INTRAVENOUS

## 2023-03-27 MED ORDER — STERILE WATER FOR IRRIGATION IR SOLN
Status: DC | PRN
  Administered 2023-03-27: 1000 mL

## 2023-03-27 MED ORDER — HYDROMORPHONE HCL 1 MG/ML IJ SOLN
0.5000 mg | INTRAMUSCULAR | Status: DC | PRN
Start: 2023-03-27 — End: 2023-03-27

## 2023-03-27 MED ORDER — QUETIAPINE FUMARATE 300 MG PO TABS
300.0000 mg | ORAL_TABLET | Freq: Every day | ORAL | Status: DC
  Administered 2023-03-27: 300 mg via ORAL
  Filled 2023-03-27: qty 3
  Filled 2023-03-27: qty 1

## 2023-03-27 MED ORDER — ONDANSETRON HCL 4 MG/2ML IJ SOLN
INTRAMUSCULAR | Status: AC
  Filled 2023-03-27: qty 2

## 2023-03-27 MED ORDER — PHENYLEPHRINE HCL (PRESSORS) 10 MG/ML IV SOLN
INTRAVENOUS | Status: DC | PRN
  Administered 2023-03-27 (×3): 160 ug via INTRAVENOUS

## 2023-03-27 MED ORDER — SODIUM CHLORIDE 0.9 % IR SOLN
3000.0000 mL | Status: DC
  Administered 2023-03-27 – 2023-03-28 (×3): 3000 mL

## 2023-03-27 MED ORDER — LACTATED RINGERS IV SOLN: INTRAVENOUS | Status: DC | PRN

## 2023-03-27 MED ORDER — ROCURONIUM BROMIDE 10 MG/ML (PF) SYRINGE
PREFILLED_SYRINGE | INTRAVENOUS | Status: AC
  Filled 2023-03-27: qty 10

## 2023-03-27 MED ORDER — SODIUM CHLORIDE 0.9 % IV SOLN
2.0000 g | INTRAVENOUS | Status: AC
  Administered 2023-03-27: 2 g via INTRAVENOUS
  Filled 2023-03-27: qty 20

## 2023-03-27 MED ORDER — ROCURONIUM BROMIDE 100 MG/10ML IV SOLN
INTRAVENOUS | Status: DC | PRN
  Administered 2023-03-27: 60 mg via INTRAVENOUS
  Administered 2023-03-27 (×2): 20 mg via INTRAVENOUS

## 2023-03-27 MED ORDER — SUGAMMADEX SODIUM 200 MG/2ML IV SOLN
INTRAVENOUS | Status: DC | PRN
  Administered 2023-03-27: 200 mg via INTRAVENOUS

## 2023-03-27 MED ORDER — LACTATED RINGERS IV SOLN: INTRAVENOUS | Status: DC

## 2023-03-27 MED ORDER — FENTANYL CITRATE (PF) 250 MCG/5ML IJ SOLN
INTRAMUSCULAR | Status: AC
  Filled 2023-03-27: qty 5

## 2023-03-27 SURGICAL SUPPLY — 28 items
BAG URINE DRAIN 2000ML AR STRL (UROLOGICAL SUPPLIES) ×2 IMPLANT
BAND RUBBER #18 3X1/16 STRL (MISCELLANEOUS) IMPLANT
CANISTER SUCT 3000ML PPV (MISCELLANEOUS) ×2 IMPLANT
CATH HEMA 3WAY 30CC 22FR COUDE (CATHETERS) IMPLANT
CATH HEMA 3WAY 30CC 24FR COUDE (CATHETERS) IMPLANT
COVER MAYO STAND STRL (DRAPES) ×2 IMPLANT
DRAPE FOOT SWITCH (DRAPES) ×2 IMPLANT
DRAPE SURG IRRIG POUCH 19X23 (DRAPES) IMPLANT
GEL ULTRASOUND 8.5O AQUASONIC (MISCELLANEOUS) ×2 IMPLANT
GLOVE SURG LX STRL 7.5 STRW (GLOVE) ×2 IMPLANT
GOWN STRL REUS W/ TWL XL LVL3 (GOWN DISPOSABLE) ×2 IMPLANT
HANDPIECE AQUABEAM (MISCELLANEOUS) ×2 IMPLANT
HOLDER FOLEY CATH W/STRAP (MISCELLANEOUS) IMPLANT
KIT TURNOVER KIT A (KITS) IMPLANT
LOOP CUT BIPOLAR 24F LRG (ELECTROSURGICAL) IMPLANT
MANIFOLD NEPTUNE II (INSTRUMENTS) ×2 IMPLANT
MAT ABSORB FLUID 56X50 GRAY (MISCELLANEOUS) ×2 IMPLANT
PACK CYSTO (CUSTOM PROCEDURE TRAY) ×2 IMPLANT
PACK DRAPE AQUABEAM (MISCELLANEOUS) ×2 IMPLANT
PAD PREP 24X48 CUFFED NSTRL (MISCELLANEOUS) ×2 IMPLANT
PIN SAFETY STERILE (MISCELLANEOUS) IMPLANT
SYR 30ML LL (SYRINGE) ×2 IMPLANT
SYR TOOMEY IRRIG 70ML (MISCELLANEOUS) ×2 IMPLANT
SYRINGE TOOMEY IRRIG 70ML (MISCELLANEOUS) ×4 IMPLANT
TOWEL OR 17X26 10 PK STRL BLUE (TOWEL DISPOSABLE) ×2 IMPLANT
TUBING CONNECTING 10 (TUBING) ×4 IMPLANT
TUBING UROLOGY SET (TUBING) ×2 IMPLANT
UNDERPAD 30X36 HEAVY ABSORB (UNDERPADS AND DIAPERS) ×2 IMPLANT

## 2023-03-27 NOTE — Anesthesia Postprocedure Evaluation (Signed)
Anesthesia Post Note  Patient: Ruben [name]  Procedure(s) Performed: TRANSURETHRAL WATERJET ABLATION OF PROSTATE (Prostate)     Patient location during evaluation: PACU Anesthesia Type: General Level of consciousness: awake and alert Pain management: pain level controlled Vital Signs Assessment: post-procedure vital signs reviewed and stable Respiratory status: spontaneous breathing, nonlabored ventilation, respiratory function stable and patient connected to nasal cannula oxygen Cardiovascular status: blood pressure returned to baseline and stable Postop Assessment: no apparent nausea or vomiting Anesthetic complications: no  No notable events documented.  Last Vitals:  Vitals:   03/27/23 1915 03/27/23 1930  BP: (!) 138/91 118/68  Pulse: 95 96  Resp: 13 10  Temp:    SpO2: 97% 98%    Last Pain:  Vitals:   03/27/23 1938  TempSrc:   PainSc: 6                  Karra Pink,W. EDMOND

## 2023-03-27 NOTE — Anesthesia Procedure Notes (Signed)
Procedure Name: Intubation Date/Time: 03/27/2023 3:00 PM  Performed by: Jamelle Rushing, CRNAPre-anesthesia Checklist: Patient identified, Emergency Drugs available, Suction available, Patient being monitored and Timeout performed Patient Re-evaluated:Patient Re-evaluated prior to induction Oxygen Delivery Method: Circle system utilized Preoxygenation: Pre-oxygenation with 100% oxygen Induction Type: IV induction Ventilation: Mask ventilation without difficulty Laryngoscope Size: Mac and 3 Grade View: Grade III Tube type: Oral Tube size: 8.0 mm Number of attempts: 1 Airway Equipment and Method: Stylet Placement Confirmation: ETT inserted through vocal cords under direct vision, positive ETCO2 and breath sounds checked- equal and bilateral Secured at: 24 cm Tube secured with: Tape Dental Injury: Teeth and Oropharynx as per pre-operative assessment

## 2023-03-27 NOTE — H&P (Signed)
H&P  Chief Complaint: BPH, incomplete bladder emptying  History of Present Illness: Ruben [name] is a 72 year old male with a long history of BPH.  He presented in 2017 and urinary retention.  He started tamsulosin and finasteride.  He underwent a KTP/Greenlight photo vaporization of the prostate in 2017.  He did well.  He was able to stop his medications.  However over the past few years his stream is slowed and his postvoid residuals have increased.  He restarted tamsulosin and finasteride.  He would like to get off medications.  He has had urgency and some incontinence.  He reports he'd like to be able to ejaculate but has not been able to since the KTP laser in 2017. Prostate ultrasound revealed 134 g prostate and cystoscopy revealed lateral lobe regrowth.  Also some intravesical component.  He presents today for robotic water jet ablation of the prostate.   He was sober for years but had a relapse and was admitted recently with withdrawal but has been sober he reports for the past week.  No complaints today.  No dysuria or gross hematuria. No fever.  No cough cold or congestion.  He did undergo a UroCuff and had a straining type pattern with a high flow rate and prolonged voiding.  His 03/25/2023 hemoglobin was 14.4.  Mag 2.1.  Creatinine 0.91.  03/23/2023 LFTs normal.  Past Medical History:  Diagnosis Date   Alcoholism (HCC)    Anxiety    Aortic atherosclerosis (HCC) 12/26/2016   Noted on CT   Arthritis    BPH (benign prostatic hyperplasia)    Chronic constipation    takes magnesium   Depression    Duodenal ulcer    Dyspnea 12/17/2017   ED (erectile dysfunction)    GERD (gastroesophageal reflux disease)    Heart palpitations    Occ   Hiatal hernia    History of adenomatous polyp of colon    tubular adenoma's 2005   History of Helicobacter pylori infection    2008   Hypercholesteremia    Hypertension    MR (mitral regurgitation)    Mild, noted on ECHO   Palpitations     Pre-diabetes    Pulmonary nodule 12/26/2016   noted on CT, 5 x 3 mm nodular opacity right upper lobe   Rosacea    Schatzki's ring    last dilated 02/ 2016   Substance abuse (HCC)    Urinary retention    history of   Varicocele 02/25/2014   Small to moderate left varicocele   Past Surgical History:  Procedure Laterality Date   BALLOON DILATION N/A 06/08/2014   Procedure: BALLOON DILATION;  Surgeon: Charolett Bumpers, MD;  Location: [facility];  Service: Endoscopy;  Laterality: N/A;   COLONOSCOPY WITH PROPOFOL N/A 06/08/2014   Procedure: COLONOSCOPY WITH PROPOFOL;  Surgeon: Charolett Bumpers, MD;  Location: [facility];  Service: Endoscopy;  Laterality: N/A;   ESOPHAGOGASTRODUODENOSCOPY N/A 06/08/2014   Procedure: ESOPHAGOGASTRODUODENOSCOPY (EGD);  Surgeon: Charolett Bumpers, MD;  Location: [facility];  Service: Endoscopy;  Laterality: N/A;   ESOPHAGOGASTRODUODENOSCOPY (EGD) WITH ESOPHAGEAL DILATION N/A 12/31/2012   Procedure: ESOPHAGOGASTRODUODENOSCOPY (EGD) WITH ESOPHAGEAL DILATION;  Surgeon: Charolett Bumpers, MD;  Location: [facility];  Service: Endoscopy;  Laterality: N/A;   GREEN LIGHT LASER TURP (TRANSURETHRAL RESECTION OF PROSTATE N/A 12/28/2015   Procedure: GREEN LIGHT LASER TURP (TRANSURETHRAL RESECTION OF PROSTATE;  Surgeon: Jerilee Field, MD;  Location: [facility];  Service: Urology;  Laterality: N/A;  HERNIA REPAIR  04/2018   hiatal   LEFT HEART CATH AND CORONARY ANGIOGRAPHY N/A 05/16/2022   Procedure: LEFT HEART CATH AND CORONARY ANGIOGRAPHY;  Surgeon: Elder Negus, MD;  [facility]: [facility];  Service: Cardiovascular;  Laterality: N/A;    Home Medications:  Medications Prior to Admission  Medication Sig Dispense Refill Last Dose/Taking   cloNIDine (CATAPRES) 0.1 MG tablet Take 1 tablet (0.1 mg total) by mouth 2 (two) times daily. (Patient taking differently: Take 0.1 mg by mouth at bedtime.) 60 tablet 11 03/26/2023   desvenlafaxine  (PRISTIQ) 50 MG 24 hr tablet Take 50 mg by mouth daily.   Past Month   finasteride (PROSCAR) 5 MG tablet Take 5 mg by mouth daily.   03/26/2023   folic acid (FOLVITE) 1 MG tablet Take 1 tablet (1 mg total) by mouth daily. 30 tablet 1 03/26/2023   gabapentin (NEURONTIN) 300 MG capsule Take 300 mg by mouth 2 (two) times daily.   03/25/2023   hydrOXYzine (ATARAX) 50 MG tablet Take 50 mg by mouth daily.   Past Week   LORazepam (ATIVAN) 0.5 MG tablet Take 0.5 mg by mouth every 8 (eight) hours as needed for anxiety.   Past Month   magnesium hydroxide (MILK OF MAGNESIA) 400 MG/5ML suspension Take 30 mLs by mouth every other day.   Past Week   metoprolol tartrate (LOPRESSOR) 50 MG tablet Take 50 mg by mouth 2 (two) times daily.   03/26/2023 at  9:00 AM   QUEtiapine (SEROQUEL) 300 MG tablet Take 300 mg by mouth at bedtime.   03/25/2023   rosuvastatin (CRESTOR) 20 MG tablet Take 20 mg by mouth every Monday, Wednesday, and Friday.   03/23/2023   tamsulosin (FLOMAX) 0.4 MG CAPS capsule Take 0.4 mg by mouth at bedtime.   03/26/2023   Allergies: No Known Allergies  Family History  Problem Relation Age of Onset   Heart failure Mother    Breast cancer Mother    Stroke Father    Other Father        Colon Issues   Lung disease Neg Hx    Social History:  reports that he has never smoked. He has never used smokeless tobacco. He reports current alcohol use of about 154.0 standard drinks of alcohol per week. He reports that he does not currently use drugs after having used the following drugs: Marijuana.  ROS: A complete review of systems was performed.  All systems are negative except for pertinent findings as noted. Review of Systems  All other systems reviewed and are negative.    Physical Exam:  Vital signs in last 24 hours: Temp:  [97.9 F (36.6 C)] 97.9 F (36.6 C) (12/17 1050) Pulse Rate:  [60-107] 107 (12/17 1251) Resp:  [16] 16 (12/17 1050) BP: (152)/(94) 152/94 (12/17 1050) SpO2:  [95  %-96 %] 95 % (12/17 1251) Weight:  [97.1 kg] 97.1 kg (12/17 1050) General:  Alert and oriented, No acute distress HEENT: Normocephalic, atraumatic Cardiovascular: Regular rate and rhythm Lungs: Regular rate and effort Abdomen: Soft, nontender, nondistended, no abdominal masses Back: No CVA tenderness Extremities: No edema Neurologic: Grossly intact  Laboratory Data:  No results found for this or any previous visit (from the past 24 hours). Recent Results (from the past 240 hours)  MRSA Next Gen by PCR, Nasal     Status: None   Collection Time: 03/22/23  3:05 PM   Specimen: Nasal Mucosa; Nasal Swab  Result Value Ref Range Status  MRSA by PCR Next Gen NOT DETECTED NOT DETECTED Final    Comment: (NOTE) The GeneXpert MRSA Assay (FDA approved for NASAL specimens only), is one component of a comprehensive MRSA colonization surveillance program. It is not intended to diagnose MRSA infection nor to guide or monitor treatment for MRSA infections. Test performance is not FDA approved in patients less than 44 years old. Performed at [facility], [address] [place], [place] [address]    Creatinine: Recent Labs    03/22/23 5366 03/22/23 1843 03/23/23 0308 03/24/23 0446 03/25/23 0407  CREATININE 1.45* 1.08 1.03 0.91 0.91    Impression/Assessment:  BPH with incomplete bladder emptying-  Plan:  I discussed with the patient the nature, potential benefits, risks and alternatives to robotic water jet ablation of the prostate, including side effects of the proposed treatment, the likelihood of the patient achieving the goals of the procedure, and any potential problems that might occur during the procedure or recuperation.  We again discussed flow symptoms typically and proven emptying typically improves.  Frequency urgency and incontinence can persist or worsen.  Also discussed he may have continued incomplete bladder emptying due to a bladder issue or a weak  bladder.  Also discussed we will treat him with an ejaculation preserving plan, but he may not be able to ejaculate after if he doesn't currently have the ability. All questions answered. Patient elects to proceed.   Jerilee Field 03/27/2023, 2:11 PM

## 2023-03-27 NOTE — Op Note (Signed)
Preoperative diagnosis: BPH with lower urinary tract symptoms, weak stream, frequency  Postoperative diagnosis: Same   Procedure: Robotic water jet ablation of the prostate   Surgeon: Mena Goes   Anesthesia: General   Indication for procedure:   Findings:  Penis was circumcised.  The meatus and the glans appeared normal without lesion.  On DRE the prostate was about 50 g and smooth without hard area or nodule.  Final rectal exam when we removed the probe noted there to be no rectal bleeding and no rectal injury on palpation.  Cystoscopy revealed lateral lobe hypertrophy especially anteriorly that was fused.  There was a channel under the lobes and the bladder neck from about 3:00 to 9:00 was clear of prostate tissue as well as clear over the trigone.  Most of the tissue was anteriorly.  And then obstructing laterally getting down toward the verumontanum.  Therefore we started the case by doing a TURP from the bladder neck to the mid prostate only anteriorly to separate the lobes and create a tract for the robotic water jet.  Description of procedure:  He was brought to the operating room and placed supine on the operating table.  After adequate anesthesia he was placed lithotomy position. Timeout was performed to confirm the patient and procedure. The TRUS Stepper was mounted to the Articulating Arm and secured to OR bed. The ultrasound probe was attached to the stepper. Exam under anesthesia was performed and the TRUS was inserted per rectum.  There was no resistance. The ultrasound probe was aligned, and confirmation made that the prostate is centered and aligned using both transverse and sagittal views. The bladder neck, verumontanum and the central/transition zones were identified.  Genitalia were prepped and draped in the usual sterile fashion. The 60F AQUABEAM Handpiece is inserted into the prostatic urethra and a complete cystoscopic evaluation was performed by inspecting the prostate,  bladder, and identifying the location of the verumontanum/external sphincter. The AQUABEAM Handpiece was secured to the Handpiece Articulating Arm. Confirmed alignment of AQUABEAM Handpiece and TRUS Probe to be parallel and colinear.  However, we could not get the aqua beam anterior to the lateral lobe tissue.  Therefore we removed the aqua beam water jet and placed the continuous-flow sheath with visual obturator and then swapped that out for the loop and handle.  We then started at 12:00 at the bladder neck and resected between the lateral lobes all the way up to separate the lobes and brought this incision down into the mid prostatic urethra where then it was in line with the lateral lobes heading toward the verumontanum.  This created an excellent anterior channel to slide and the aqua beam.  The loop and handle were removed.  The 60F AQUABEAM Handpiece is inserted into the prostatic urethra. We again identified the bladder neck and identified the location of the verumontanum/external sphincter. The AQUABEAM Handpiece was secured to the Handpiece Articulating Arm. Confirmed alignment of AQUABEAM Handpiece and TRUS Probe to be parallel and colinear. Confirmation that AQUABEAM nozzle is centered and anterior of the bladder neck tissue. The cystoscope was then retracted to visualize the verumontanum and external sphincter and the cystoscope tip was positioned just proximal to the external sphincter. Reconfirmed alignment of the TRUS probe with the AQUABEAM Handpiece and compression applied with TRUS probe. Horizontal alignment of the Handpiece waterjet nozzle was performed. The Aquablation treatment zones were planned utilizing real-time TRUS to visualize the contour of the prostate and the depth and radial angles of resection were  defined in the transverse view. In the sagittal view, the AQUABEAM nozzle is identified and position registered with software. The treatment contours were then adjusted to conform to  the intended resection margins. The median lobe, bladder neck and verumontanum were marked and confirmed in the treatment contour. The Aquablation Treatment was then started following the resection contour confirmed under ultrasound guidance.    About two thirds of the way through the first pass we lost ultrasound visualization and therefore halted the treatment.  The ultrasound probe was interrogated and it appeared the cover had burst and we lost ultrasound gel from around the probe.  Therefore the ultrasound probe was removed and a new cover was placed with ultrasound jelly.  I then did another rectal exam which was normal and injected further ultrasound jelly per rectum.  The TRUS was then carefully inserted again per rectum.  Again there was no resistance.  The ultrasound probe was aligned and confirmation made that the prostate was centered and aligned using both the transverse and sagittal views.  The bladder neck to verumontanum and the central/transition zones were identified again.  We then confirmed alignment again with the aqua beam handpiece and the TRUS probe to be parallel and collinear.  We centered the probe and guided anterior to the bladder neck tissue again.  We made a plan to treat the median lobe tissue which was really only right lateral lobe tissue.  We narrowed the angles only the right side was treated intravesically and nothing inferior lateral left.  The horizontal alignment of the handpiece and the water jet nozzle was performed.  The treatment zones were planned again using real-time TRUS to visualize the contour the prostate and the depth and radial angles of resection were identified again in the transverse view.  On the sagittal view we went back to identify the aqua beam nozzle in the position registered with the software.  The treatment contours were again adjusted to conform to the intended resection margins.  The median lobe plan (right intravesical component), bladder neck and  verumontanum were again marked and confirmed in the treatment contour.  The aqua laser treatment was then started for the second pass following the resection contour confirmed on the ultrasound guidance.  TOTAL AQUABLATION RESECTION TIME: About 4 minutes x 2 passes  Once Aquablation resection was complete the 24 French aqua beam handpiece was carefully removed.  The continuous-flow sheath with the visual obturator was passed and then the loop and handle.  The trigone and the ureteral orifices were identified.  Resection of some of the residual lateral bladder neck tissue was done on the right side.  The bladder neck was identified at 6:00 and this was taken up to 12:00 with fulguration of the bladder neck and prostate for hemostasis on the right side.  Slight amount of anterior tissue was resected.  Similarly from 6:00 up to 12:00 on the left side of the bladder neck was identified by resecting some of the ablated tissue to identify the bladder neck and cauterize any bleeding.  Some anterior tissue on the left was resected.  This created excellent hemostasis.  All the chips were evacuated.  Ureteral orifices again identified and noted to be normal without injury.  The scope was backed out and a 22 [place] hematuria catheter was placed with 30 cc in the balloon.  The balloon was seated at the bladder neck and it was irrigated on light traction and noted to be clear to pink.  He was  hooked up to CBI.  He was cleaned up and placed supine.  Catheter was placed on traction.  He was awakened and taken to the cover room in stable condition.  Complications: None  Blood loss: 50 mL  Specimens: None  Drains: 22 French three-way hematuria catheter with 30 cc in the balloon  Disposition: Patient stable to PACU.  Given the time we will plan to admit for overnight traction and CBI with discharge in the morning with Foley catheter.

## 2023-03-27 NOTE — Discharge Instructions (Signed)
Water Jet Ablation Prostate of the Prostate, Care After The following information offers guidance on how to care for yourself after your procedure. Your health care provider may also give you more specific instructions. If you have problems or questions, contact your health care provider. What can I expect after the procedure? After the procedure, it is common to have: Mild pain in your lower abdomen. Soreness or mild discomfort in your penis or when you urinate. This is from having the catheter inserted during the procedure. A sudden urge to urinate (urgency). A need to urinate often. A small amount of blood in your urine. You may notice some small blood clots in your urine. These are normal. Follow these instructions at home: Medicines Take over-the-counter and prescription medicines only as told by your health care provider. If you were prescribed an antibiotic medicine, take it as told by your health care provider. Do not stop taking the antibiotic even if you start to feel better. Activity  Rest as told by your health care provider. Avoid sitting for a long time without moving. Get up to take short walks every 1-2 hours. This is important to improve blood flow and breathing. Ask for help if you feel weak or unsteady. You may increase your physical activity gradually as you start to feel better. Do not drive or operate machinery until your health care provider says that it is safe. Do not ride in a car for long periods of time, or as told by your health care provider. Avoid intense physical activity for as long as told by your health care provider. Do not lift anything that is heavier than 10 lb (4.5 kg), or the limit that you are told, until your health care provider says that it is safe. Do not have sex until your health care provider approves. Return to your normal activities as told by your health care provider. Ask your health care provider what activities are safe for you. Preventing  constipation  You may need to take these actions to prevent or treat constipation: Drink enough fluid to keep your urine pale yellow. Take over-the-counter or prescription medicines. Eat foods that are high in fiber, such as beans, whole grains, and fresh fruits and vegetables. Limit foods that are high in fat and processed sugars, such as fried or sweet foods.   General instructions Do not strain when you have a bowel movement. Straining may lead to bleeding from the prostate. This may cause blood clots and trouble urinating. Do not use any products that contain nicotine or tobacco. These products include cigarettes, chewing tobacco, and vaping devices, such as e-cigarettes. If you need help quitting, ask your health care provider. If you go home with a tube draining your urine (urinary catheter), care for the catheter as told by your health care provider. Wear compression stockings as told by your health care provider. These stockings help to prevent blood clots and reduce swelling in your legs. Keep all follow-up visits. This is important. Contact a health care provider if: You have signs of infection, such as: Fever or chills. Urine that smells very bad. Swelling around your urethra that is getting worse. Swelling in your penis or testicles. You have difficulty urinating. You have pain that gets worse or does not improve with medicine. You have blood in your urine that does not go away after 1 week of resting and drinking more fluids. You have trouble having a bowel movement. You have trouble having or keeping an erection. No  semen comes out during orgasm (dry ejaculation). You have a urinary catheter in place, and you have: Spasms or pain. Problems with your catheter or your catheter is blocked. Get help right away if: You are unable to urinate. You are having more blood clots in your urine instead of fewer. You have: Large blood clots. A lot of blood in your urine. Pain in  your back or lower abdomen. You have difficulty breathing or shortness of breath. You develop swelling or pain in your leg. These symptoms may be an emergency. Get help right away. Call 911. Do not wait to see if the symptoms will go away. Do not drive yourself to the hospital. Summary After the procedure, it is common to have a small amount of blood in your urine. Follow restrictions about lifting and sexual activity as told by your health care provider. Ask what activities are safe for you. Keep all follow-up visits. This is important. This information is not intended to replace advice given to you by your health care provider. Make sure you discuss any questions you have with your health care provider. Document Revised: 12/21/2020 Document Reviewed: 12/21/2020 Elsevier Patient Education  2024 [employer].

## 2023-03-27 NOTE — Transfer of Care (Signed)
Immediate Anesthesia Transfer of Care Note  Patient: [name]  Procedure(s) Performed: TRANSURETHRAL WATERJET ABLATION OF PROSTATE (Prostate)  Patient Location: PACU  Anesthesia Type:General  Level of Consciousness: awake and alert   Airway & Oxygen Therapy: Patient Spontanous Breathing and Patient connected to face mask oxygen  Post-op Assessment: Report given to RN and Post -op Vital signs reviewed and stable  Post vital signs: Reviewed and stable  Last Vitals:  Vitals Value Taken Time  BP 146/77 03/27/23 1709  Temp    Pulse 83 03/27/23 1712  Resp 11 03/27/23 1712  SpO2 100 % 03/27/23 1712  Vitals shown include unfiled device data.  Last Pain:  Vitals:   03/27/23 1243  TempSrc:   PainSc: 0-No pain         Complications: No notable events documented.

## 2023-03-28 DIAGNOSIS — R350 Frequency of micturition: Secondary | ICD-10-CM | POA: Diagnosis not present

## 2023-03-28 DIAGNOSIS — R3912 Poor urinary stream: Secondary | ICD-10-CM | POA: Diagnosis not present

## 2023-03-28 DIAGNOSIS — Z79899 Other long term (current) drug therapy: Secondary | ICD-10-CM | POA: Diagnosis not present

## 2023-03-28 DIAGNOSIS — N401 Enlarged prostate with lower urinary tract symptoms: Secondary | ICD-10-CM | POA: Diagnosis not present

## 2023-03-28 DIAGNOSIS — I10 Essential (primary) hypertension: Secondary | ICD-10-CM | POA: Diagnosis not present

## 2023-03-28 LAB — HEMOGLOBIN AND HEMATOCRIT, BLOOD
HCT: 42 % (ref 39.0–52.0)
Hemoglobin: 13.9 g/dL (ref 13.0–17.0)

## 2023-03-28 LAB — BASIC METABOLIC PANEL
Anion gap: 11 (ref 5–15)
BUN: 17 mg/dL (ref 8–23)
CO2: 21 mmol/L — ABNORMAL LOW (ref 22–32)
Calcium: 7.9 mg/dL — ABNORMAL LOW (ref 8.9–10.3)
Chloride: 99 mmol/L (ref 98–111)
Creatinine, Ser: 1.21 mg/dL (ref 0.61–1.24)
GFR, Estimated: >60 mL/min (ref >60)
Glucose, Bld: 201 mg/dL — ABNORMAL HIGH (ref 70–99)
Potassium: 4.2 mmol/L (ref 3.5–5.1)
Sodium: 131 mmol/L — ABNORMAL LOW (ref 135–145)

## 2023-03-28 MED ORDER — CHLORHEXIDINE GLUCONATE CLOTH 2 % EX PADS
6.0000 | MEDICATED_PAD | Freq: Every day | CUTANEOUS | Status: DC
  Administered 2023-03-28: 6 via TOPICAL

## 2023-03-28 NOTE — Plan of Care (Signed)
  Problem: Education: Goal: Knowledge of General Education information will improve Description: Including pain rating scale, medication(s)/side effects and non-pharmacologic comfort measures 03/28/2023 1111 by Margit Banda, RN Outcome: Adequate for Discharge 03/28/2023 1110 by Rondell Frick, Darcus Austin, RN Outcome: Adequate for Discharge   Problem: Health Behavior/Discharge Planning: Goal: Ability to manage health-related needs will improve 03/28/2023 1111 by Margit Banda, RN Outcome: Adequate for Discharge 03/28/2023 1110 by BausDarcus Austin, RN Outcome: Adequate for Discharge   Problem: Clinical Measurements: Goal: Ability to maintain clinical measurements within normal limits will improve 03/28/2023 1111 by Margit Banda, RN Outcome: Adequate for Discharge 03/28/2023 1110 by BausDarcus Austin, RN Outcome: Adequate for Discharge Goal: Will remain free from infection 03/28/2023 1111 by Margit Banda, RN Outcome: Adequate for Discharge 03/28/2023 1110 by BausDarcus Austin, RN Outcome: Adequate for Discharge Goal: Diagnostic test results will improve 03/28/2023 1111 by Margit Banda, RN Outcome: Adequate for Discharge 03/28/2023 1110 by Margit Banda, RN Outcome: Adequate for Discharge Goal: Respiratory complications will improve 03/28/2023 1111 by Margit Banda, RN Outcome: Adequate for Discharge 03/28/2023 1110 by BausDarcus Austin, RN Outcome: Adequate for Discharge Goal: Cardiovascular complication will be avoided 03/28/2023 1111 by Margit Banda, RN Outcome: Adequate for Discharge 03/28/2023 1110 by BausDarcus Austin, RN Outcome: Adequate for Discharge   Problem: Pain Management: Goal: General experience of comfort will improve 03/28/2023 1111 by Margit Banda, RN Outcome: Adequate for Discharge 03/28/2023 1110 by Analicia Skibinski, Darcus Austin, RN Outcome: Adequate for Discharge

## 2023-03-28 NOTE — Care Management Obs Status (Signed)
MEDICARE OBSERVATION STATUS NOTIFICATION   Patient Details  Name: Ruben [name] MRN: [id number] Date of Birth: [birth year]   Medicare Observation Status Notification Given:  Yes    Larrie Kass, LCSW 03/28/2023, 10:38 AM

## 2023-03-28 NOTE — Discharge Summary (Addendum)
Date of admission: 03/27/2023  Date of discharge: 03/28/2023  Admission diagnosis:  BPH (benign prostatic hyperplasia) [N40.0]   Discharge diagnosis:  BPH (benign prostatic hyperplasia) [N40.0]  Secondary diagnoses:   Active Ambulatory Problems    Diagnosis Date Noted   S/P Nissen fundoplication (without gastrostomy tube) procedure 04/26/2018   Alcohol intoxication (HCC) 06/21/2019   GERD (gastroesophageal reflux disease) 06/22/2019   BPH (benign prostatic hyperplasia) 06/22/2019   Essential hypertension 06/22/2019   AKI (acute kidney injury) (HCC)    ARF (acute renal failure) (HCC) 10/01/2019   Alcohol withdrawal (HCC) 10/01/2019   Normochromic normocytic anemia 10/01/2019   Metabolic acidosis 12/22/2019   Thrombocytopenia (HCC) 12/22/2019   Alcohol use disorder, severe, dependence (HCC) 04/03/2020   Hyperlipidemia 04/03/2020   Suicidal ideation 04/03/2020   Weight loss 04/08/2020   Unsteady gait 04/08/2020   Shoulder joint pain 03/31/2019   Insomnia 04/08/2020   Lump of skin of left upper extremity 04/08/2020   Irregular heart beat 04/08/2020   Rosacea 06/24/2011   Pure hypercholesterolemia 04/08/2020   Mild renal insufficiency 06/23/2020   Sinus tachycardia 08/16/2020   Palpitations 08/16/2020   Exertional dyspnea 08/16/2020   Acute kidney injury (HCC) 09/08/2020   Hyponatremia 09/08/2020   Hyperbilirubinemia 09/08/2020   Elevated LFTs    Aortic atherosclerosis (HCC) 04/13/2022   Abnormal stress test 05/11/2022   Elevated coronary artery calcium score 05/11/2022   Encephalopathy acute 12/17/2022   Lactic acidosis 12/17/2022   Dehydration 12/17/2022   Acute lactic acidosis 12/17/2022   Lactic acid acidosis 02/26/2023   Severe alcohol withdrawal delirium (HCC) 02/27/2023   Alcoholic ketoacidosis 03/22/2023   Actinic keratosis 06/04/2020   Alcohol withdrawal hallucinosis (HCC) 03/22/2023   MDD (major depressive disorder), recurrent episode, moderate (HCC)  06/01/2020   Prediabetes 06/04/2020   Resolved Ambulatory Problems    Diagnosis Date Noted   No Resolved Ambulatory Problems   Past Medical History:  Diagnosis Date   Alcoholism (HCC)    Anxiety    Arthritis    Chronic constipation    Depression    Duodenal ulcer    Dyspnea 12/17/2017   ED (erectile dysfunction)    Heart palpitations    Hiatal hernia    History of adenomatous polyp of colon    History of Helicobacter pylori infection    Hypercholesteremia    Hypertension    MR (mitral regurgitation)    Pre-diabetes    Pulmonary nodule 12/26/2016   Schatzki's ring    Substance abuse (HCC)    Urinary retention    Varicocele 02/25/2014     History and Physical: For full details, please see admission history and physical. Briefly, Ruben [name] is a 72 y.o. year old patient who was admitted with BPH.   Hospital Course: Pt admitted and underwent aquajet ablation on 03/27/2023. Their hospital course was unremarkable. By POD1, they were tolerating a regular diet, pain was controlled with oral medications, and they were deemed appropriate for discharge.   Their course was complicated by: none  Pt discharged home on foley, will take keflex until foley catheter removal appointment.   On the day of discharge, the patient was tolerating a regular diet and their pain was well controlled. They were determined to be stable for discharge home  Laboratory values:  Recent Labs    03/28/23 0433  HGB 13.9  HCT 42.0   Recent Labs    03/28/23 0433  CREATININE 1.21    Disposition: Home  Discharge medications:  Allergies as of 03/28/2023  No Known Allergies      Medication List     TAKE these medications    cephALEXin 500 MG capsule Commonly known as: KEFLEX Take 1 capsule (500 mg total) by mouth at bedtime.   cloNIDine 0.1 MG tablet Commonly known as: CATAPRES Take 1 tablet (0.1 mg total) by mouth 2 (two) times daily. What changed: when to take this    desvenlafaxine 50 MG 24 hr tablet Commonly known as: PRISTIQ Take 50 mg by mouth daily.   finasteride 5 MG tablet Commonly known as: PROSCAR Take 5 mg by mouth daily.   folic acid 1 MG tablet Commonly known as: FOLVITE Take 1 tablet (1 mg total) by mouth daily.   gabapentin 300 MG capsule Commonly known as: NEURONTIN Take 300 mg by mouth 2 (two) times daily.   hydrOXYzine 50 MG tablet Commonly known as: ATARAX Take 50 mg by mouth daily.   LORazepam 0.5 MG tablet Commonly known as: ATIVAN Take 0.5 mg by mouth every 8 (eight) hours as needed for anxiety.   metoprolol tartrate 50 MG tablet Commonly known as: LOPRESSOR Take 50 mg by mouth 2 (two) times daily.   Milk of Magnesia 2400 MG/30ML suspension Generic drug: magnesium hydroxide Take 30 mLs by mouth every other day.   oxybutynin 5 MG tablet Commonly known as: DITROPAN Take 1 tablet (5 mg total) by mouth 2 (two) times daily as needed for bladder spasms.   QUEtiapine 300 MG tablet Commonly known as: SEROQUEL Take 300 mg by mouth at bedtime.   rosuvastatin 20 MG tablet Commonly known as: CRESTOR Take 20 mg by mouth every Monday, Wednesday, and Friday.   tamsulosin 0.4 MG Caps capsule Commonly known as: FLOMAX Take 0.4 mg by mouth at bedtime.        Followup:   Follow-up Information     Jerilee Field, MD Follow up on 04/06/2023.   Specialty: Urology Why: at 9:30 AM Contact information: [address] [place] [address] [phone]               Attending attestation - Pt seen and examined on rounds this afternoon. Doing well. Catheter draining. Urine clear. Discussed procedure and post-op care. No complaints. Looks well.

## 2023-03-28 NOTE — TOC CM/SW Note (Signed)
Transition of Care [facility]) - Inpatient Brief Assessment   Patient Details  Name: Ruben [name] MRN: [id number] Date of Birth: Aug 27, 1950  [facility] CM/SW Contact:    Larrie Kass, LCSW Phone Number: 03/28/2023, 10:48 AM   Clinical Narrative:  CSW met with the pt at bedside to explain [facility] form and discuss substance use resources. Pt has agreed and resources were attached to pt's AVS. Pt deines need for DME and transportation assistance.TOC sign off.  Transition of Care Asessment: Insurance and Status: Insurance coverage has been reviewed Patient has primary care physician: Yes Home environment has been reviewed: home with self Prior level of function:: independent Prior/Current Home Services: No current home services Social Drivers of Health Review: SDOH reviewed no interventions necessary Readmission risk has been reviewed: Yes Transition of care needs: no transition of care needs at this time

## 2023-03-28 NOTE — Consult Note (Signed)
WOC Nurse Consult Note: Reason for Consult:Left hand abrasion.  Patient states he hit it on something and a "blood blister" formed.  Area is now dry and healing.   Wound type:trauma Pressure Injury POA: NA Measurement: 0.5 cm scabbed abrasion to left lateral hand Wound bed: scabbed Drainage (amount, consistency, odor) none  dry Periwound: intact Dressing procedure/placement/frequency: dry dressing (silicone foam) to left hand abrasion.  Change every other day.  Will not follow at this time.  Please re-consult if needed.  Mike Gip MSN, RN, FNP-BC CWON Wound, Ostomy, Continence Nurse Outpatient [facility] [phone] Pager [phone]

## 2023-04-06 DIAGNOSIS — R338 Other retention of urine: Secondary | ICD-10-CM | POA: Diagnosis not present

## 2023-04-11 ENCOUNTER — Emergency Department (HOSPITAL_COMMUNITY): Payer: Medicare HMO

## 2023-04-11 ENCOUNTER — Observation Stay (HOSPITAL_COMMUNITY)
Admission: EM | Admit: 2023-04-11 | Discharge: 2023-04-13 | Disposition: A | Discharge status: Home / Self Care | Payer: Medicare HMO | Attending: Internal Medicine | Admitting: Internal Medicine

## 2023-04-11 DIAGNOSIS — F10939 Alcohol use, unspecified with withdrawal, unspecified: Principal | ICD-10-CM

## 2023-04-11 DIAGNOSIS — Y906 Blood alcohol level of 120-199 mg/100 ml: Secondary | ICD-10-CM | POA: Insufficient documentation

## 2023-04-11 DIAGNOSIS — N400 Enlarged prostate without lower urinary tract symptoms: Secondary | ICD-10-CM | POA: Diagnosis present

## 2023-04-11 DIAGNOSIS — I10 Essential (primary) hypertension: Secondary | ICD-10-CM | POA: Diagnosis not present

## 2023-04-11 DIAGNOSIS — Z79899 Other long term (current) drug therapy: Secondary | ICD-10-CM | POA: Insufficient documentation

## 2023-04-11 DIAGNOSIS — E785 Hyperlipidemia, unspecified: Secondary | ICD-10-CM | POA: Diagnosis present

## 2023-04-11 DIAGNOSIS — F10230 Alcohol dependence with withdrawal, uncomplicated: Secondary | ICD-10-CM | POA: Diagnosis not present

## 2023-04-11 DIAGNOSIS — R0789 Other chest pain: Secondary | ICD-10-CM | POA: Diagnosis not present

## 2023-04-11 DIAGNOSIS — R110 Nausea: Secondary | ICD-10-CM | POA: Diagnosis present

## 2023-04-11 DIAGNOSIS — R0602 Shortness of breath: Secondary | ICD-10-CM | POA: Diagnosis not present

## 2023-04-11 DIAGNOSIS — F10239 Alcohol dependence with withdrawal, unspecified: Secondary | ICD-10-CM | POA: Diagnosis not present

## 2023-04-11 DIAGNOSIS — R319 Hematuria, unspecified: Secondary | ICD-10-CM | POA: Diagnosis not present

## 2023-04-11 DIAGNOSIS — R000 Tachycardia, unspecified: Secondary | ICD-10-CM | POA: Diagnosis not present

## 2023-04-11 DIAGNOSIS — R001 Bradycardia, unspecified: Secondary | ICD-10-CM | POA: Diagnosis not present

## 2023-04-11 DIAGNOSIS — F32A Depression, unspecified: Secondary | ICD-10-CM

## 2023-04-11 LAB — CBC WITH DIFFERENTIAL/PLATELET
Abs Immature Granulocytes: 0.02 10*3/uL (ref 0.00–0.07)
Basophils Absolute: 0.1 10*3/uL (ref 0.0–0.1)
Basophils Relative: 2 %
Eosinophils Absolute: 0.3 10*3/uL (ref 0.0–0.5)
Eosinophils Relative: 3 %
HCT: 46.6 % (ref 39.0–52.0)
Hemoglobin: 16.1 g/dL (ref 13.0–17.0)
Immature Granulocytes: 0 %
Lymphocytes Relative: 30 %
Lymphs Abs: 2.5 10*3/uL (ref 0.7–4.0)
MCH: 30.9 pg (ref 26.0–34.0)
MCHC: 34.5 g/dL (ref 30.0–36.0)
MCV: 89.4 fL (ref 80.0–100.0)
Monocytes Absolute: 0.5 10*3/uL (ref 0.1–1.0)
Monocytes Relative: 6 %
Neutro Abs: 5 10*3/uL (ref 1.7–7.7)
Neutrophils Relative %: 59 %
Platelets: 326 10*3/uL (ref 150–400)
RBC: 5.21 MIL/uL (ref 4.22–5.81)
RDW: 14.7 % (ref 11.5–15.5)
WBC: 8.3 10*3/uL (ref 4.0–10.5)
nRBC: 0 % (ref 0.0–0.2)

## 2023-04-11 LAB — HEPATIC FUNCTION PANEL
ALT: 34 U/L (ref 0–44)
AST: 31 U/L (ref 15–41)
Albumin: 4.1 g/dL (ref 3.5–5.0)
Alkaline Phosphatase: 68 U/L (ref 38–126)
Bilirubin, Direct: 0.2 mg/dL (ref 0.0–0.2)
Indirect Bilirubin: 0.4 mg/dL (ref 0.3–0.9)
Total Bilirubin: 0.6 mg/dL (ref 0.0–1.2)
Total Protein: 7.7 g/dL (ref 6.5–8.1)

## 2023-04-11 LAB — BASIC METABOLIC PANEL
Anion gap: 16 — ABNORMAL HIGH (ref 5–15)
BUN: 12 mg/dL (ref 8–23)
CO2: 17 mmol/L — ABNORMAL LOW (ref 22–32)
Calcium: 8.3 mg/dL — ABNORMAL LOW (ref 8.9–10.3)
Chloride: 102 mmol/L (ref 98–111)
Creatinine, Ser: 1.16 mg/dL (ref 0.61–1.24)
GFR, Estimated: >60 mL/min (ref >60)
Glucose, Bld: 110 mg/dL — ABNORMAL HIGH (ref 70–99)
Potassium: 3.9 mmol/L (ref 3.5–5.1)
Sodium: 135 mmol/L (ref 135–145)

## 2023-04-11 LAB — ETHANOL: Alcohol, Ethyl (B): 196 mg/dL — ABNORMAL HIGH (ref <10)

## 2023-04-11 LAB — LIPASE, BLOOD: Lipase: 25 U/L (ref 11–51)

## 2023-04-11 MED ORDER — CHLORDIAZEPOXIDE HCL 25 MG PO CAPS: 25.0000 mg | ORAL_CAPSULE | Freq: Four times a day (QID) | ORAL | Status: DC | PRN

## 2023-04-11 MED ORDER — LORAZEPAM 2 MG/ML IJ SOLN
2.0000 mg | Freq: Once | INTRAMUSCULAR | Status: AC
  Administered 2023-04-11: 2 mg via INTRAVENOUS
  Filled 2023-04-11: qty 1

## 2023-04-11 MED ORDER — THIAMINE MONONITRATE 100 MG PO TABS
100.0000 mg | ORAL_TABLET | Freq: Every day | ORAL | Status: DC
  Administered 2023-04-11 – 2023-04-13 (×3): 100 mg via ORAL
  Filled 2023-04-11 (×2): qty 1

## 2023-04-11 MED ORDER — ADULT MULTIVITAMIN W/MINERALS CH
1.0000 | ORAL_TABLET | Freq: Every day | ORAL | Status: DC
  Administered 2023-04-11 – 2023-04-13 (×4): 1 via ORAL
  Filled 2023-04-11 (×2): qty 1

## 2023-04-11 MED ORDER — SENNOSIDES-DOCUSATE SODIUM 8.6-50 MG PO TABS: 1.0000 | ORAL_TABLET | Freq: Every evening | ORAL | Status: DC | PRN

## 2023-04-11 MED ORDER — ONDANSETRON HCL 4 MG/2ML IJ SOLN
4.0000 mg | Freq: Once | INTRAMUSCULAR | Status: AC
  Administered 2023-04-11: 4 mg via INTRAVENOUS
  Filled 2023-04-11: qty 2

## 2023-04-11 MED ORDER — ONDANSETRON HCL 4 MG/2ML IJ SOLN: 4.0000 mg | Freq: Four times a day (QID) | INTRAMUSCULAR | Status: DC | PRN

## 2023-04-11 MED ORDER — LOPERAMIDE HCL 2 MG PO CAPS: 2.0000 mg | ORAL_CAPSULE | ORAL | Status: DC | PRN

## 2023-04-11 MED ORDER — CHLORDIAZEPOXIDE HCL 25 MG PO CAPS: 25.0000 mg | ORAL_CAPSULE | ORAL | Status: DC

## 2023-04-11 MED ORDER — ONDANSETRON HCL 4 MG PO TABS: 4.0000 mg | ORAL_TABLET | Freq: Four times a day (QID) | ORAL | Status: DC | PRN

## 2023-04-11 MED ORDER — HYDROXYZINE HCL 25 MG PO TABS: 25.0000 mg | ORAL_TABLET | Freq: Four times a day (QID) | ORAL | Status: DC | PRN

## 2023-04-11 MED ORDER — LORAZEPAM 1 MG PO TABS: 1.0000 mg | ORAL_TABLET | ORAL | Status: DC | PRN

## 2023-04-11 MED ORDER — LORAZEPAM 2 MG/ML IJ SOLN: 1.0000 mg | INTRAMUSCULAR | Status: DC | PRN

## 2023-04-11 MED ORDER — LORAZEPAM 2 MG/ML IJ SOLN
2.0000 mg | Freq: Once | INTRAMUSCULAR | Status: AC
  Administered 2023-04-11: 2 mg via INTRAVENOUS

## 2023-04-11 MED ORDER — PHENOBARBITAL SODIUM 65 MG/ML IJ SOLN: 130.0000 mg | Freq: Once | INTRAMUSCULAR | Status: DC

## 2023-04-11 MED ORDER — CHLORDIAZEPOXIDE HCL 25 MG PO CAPS: 25.0000 mg | ORAL_CAPSULE | Freq: Three times a day (TID) | ORAL | Status: DC

## 2023-04-11 MED ORDER — ENOXAPARIN SODIUM 40 MG/0.4ML IJ SOSY
40.0000 mg | PREFILLED_SYRINGE | INTRAMUSCULAR | Status: DC
  Administered 2023-04-12 – 2023-04-13 (×2): 40 mg via SUBCUTANEOUS
  Filled 2023-04-11 (×2): qty 0.4

## 2023-04-11 MED ORDER — SODIUM CHLORIDE 0.9 % IV BOLUS
1000.0000 mL | Freq: Once | INTRAVENOUS | Status: AC
  Administered 2023-04-11: 1000 mL via INTRAVENOUS

## 2023-04-11 MED ORDER — SODIUM CHLORIDE 0.9% FLUSH
3.0000 mL | Freq: Two times a day (BID) | INTRAVENOUS | Status: DC
  Administered 2023-04-11 – 2023-04-12 (×3): 3 mL via INTRAVENOUS

## 2023-04-11 MED ORDER — THIAMINE HCL 100 MG/ML IJ SOLN: 100.0000 mg | Freq: Every day | INTRAMUSCULAR | Status: DC

## 2023-04-11 MED ORDER — FOLIC ACID 1 MG PO TABS
1.0000 mg | ORAL_TABLET | Freq: Every day | ORAL | Status: DC
  Administered 2023-04-11 – 2023-04-13 (×3): 1 mg via ORAL
  Filled 2023-04-11 (×2): qty 1

## 2023-04-11 MED ORDER — CHLORDIAZEPOXIDE HCL 25 MG PO CAPS: 25.0000 mg | ORAL_CAPSULE | Freq: Every day | ORAL | Status: DC

## 2023-04-11 MED ORDER — PHENOBARBITAL SODIUM 65 MG/ML IJ SOLN: 260.0000 mg | Freq: Once | INTRAMUSCULAR | Status: DC

## 2023-04-11 MED ORDER — ACETAMINOPHEN 325 MG PO TABS: 650.0000 mg | ORAL_TABLET | Freq: Four times a day (QID) | ORAL | Status: DC | PRN

## 2023-04-11 MED ORDER — ACETAMINOPHEN 650 MG RE SUPP: 650.0000 mg | Freq: Four times a day (QID) | RECTAL | Status: DC | PRN

## 2023-04-11 MED ORDER — CHLORDIAZEPOXIDE HCL 25 MG PO CAPS
25.0000 mg | ORAL_CAPSULE | Freq: Four times a day (QID) | ORAL | Status: AC
  Administered 2023-04-11 – 2023-04-13 (×6): 25 mg via ORAL
  Filled 2023-04-11 (×6): qty 1

## 2023-04-11 NOTE — ED Provider Notes (Signed)
 [place] EMERGENCY DEPARTMENT AT [facility] Provider Note   CSN: [id number] Arrival date & time: 04/11/23  2006     History  Chief Complaint  Patient presents with   Hematuria   Alcohol  Problem    DOREN KASPAR is a 73 y.o. male.  Patient here with concern for alcohol  withdrawal.  He drinks about 700 cc of liquor a day.  He has been dry heaving has been drinking today but he is having tremors does not think he has been able to keep up with his alcohol  drinking and now starting to have withdrawals.  He has been having some chronic hematuria as well but no retention.  He had Foley catheter placed a while back.  He had urology process going on that he has a follow-up with.  But his main concern today is about his dry heaving his tremors and his concern for alcohol  withdrawal which she has had several admissions for.  He has been to rehab several times but continues to struggle with drinking.  He denies any chest pain shortness of breath abdominal pain.  Denies any trauma.  The history is provided by the patient.       Home Medications Prior to Admission medications   Medication Sig Start Date End Date Taking? Authorizing Provider  cephALEXin  (KEFLEX ) 500 MG capsule Take 1 capsule (500 mg total) by mouth at bedtime. 03/27/23   Nieves Cough, MD  cloNIDine  (CATAPRES ) 0.1 MG tablet Take 1 tablet (0.1 mg total) by mouth 2 (two) times daily. Patient taking differently: Take 0.1 mg by mouth at bedtime. 02/04/23   Gonfa, Taye T, MD  desvenlafaxine (PRISTIQ) 50 MG 24 hr tablet Take 50 mg by mouth daily. 03/05/23   [provider]  finasteride  (PROSCAR ) 5 MG tablet Take 5 mg by mouth daily.    [provider]  folic acid  (FOLVITE ) 1 MG tablet Take 1 tablet (1 mg total) by mouth daily. 03/01/23   Krishnan, Gokul, MD  gabapentin  (NEURONTIN ) 300 MG capsule Take 300 mg by mouth 2 (two) times daily.    [provider]  hydrOXYzine  (ATARAX ) 50 MG tablet  Take 50 mg by mouth daily.    [provider]  LORazepam  (ATIVAN ) 0.5 MG tablet Take 0.5 mg by mouth every 8 (eight) hours as needed for anxiety.    [provider]  magnesium  hydroxide (MILK OF MAGNESIA) 400 MG/5ML suspension Take 30 mLs by mouth every other day.    [provider]  metoprolol  tartrate (LOPRESSOR ) 50 MG tablet Take 50 mg by mouth 2 (two) times daily.    [provider]  oxybutynin  (DITROPAN ) 5 MG tablet Take 1 tablet (5 mg total) by mouth 2 (two) times daily as needed for bladder spasms. 03/27/23   Nieves Cough, MD  QUEtiapine  (SEROQUEL ) 300 MG tablet Take 300 mg by mouth at bedtime. 12/02/22   [provider]  rosuvastatin  (CRESTOR ) 20 MG tablet Take 20 mg by mouth every Monday, Wednesday, and Friday. 08/23/21   [provider]  tamsulosin  (FLOMAX ) 0.4 MG CAPS capsule Take 0.4 mg by mouth at bedtime.    [provider]      Allergies    Patient has no known allergies.    Review of Systems   Review of Systems  Physical Exam Updated Vital Signs BP (!) 156/81   Pulse (!) 106   Temp 98.4 F (36.9 C) (Oral)   Resp 14   SpO2 95%  Physical Exam  Vitals and nursing note reviewed.  Constitutional:      General: He is not in acute distress.    Appearance: He is well-developed. He is ill-appearing.  HENT:     Head: Normocephalic and atraumatic.     Nose: Nose normal.     Mouth/Throat:     Mouth: Mucous membranes are moist.  Eyes:     Extraocular Movements: Extraocular movements intact.     Conjunctiva/sclera: Conjunctivae normal.     Pupils: Pupils are equal, round, and reactive to light.  Cardiovascular:     Rate and Rhythm: Regular rhythm. Tachycardia present.     Pulses: Normal pulses.     Heart sounds: No murmur heard. Pulmonary:     Effort: Pulmonary effort is normal. No respiratory distress.     Breath sounds: Normal breath sounds.  Abdominal:     Palpations: Abdomen is soft.     Tenderness:  There is no abdominal tenderness.  Musculoskeletal:        General: No swelling.     Cervical back: Neck supple.  Skin:    General: Skin is warm and dry.     Capillary Refill: Capillary refill takes less than 2 seconds.  Neurological:     Mental Status: He is alert.     Comments: Moves all extremities but he has tremors 5+ out of 5 strength, normal sensation, normal speech  Psychiatric:        Mood and Affect: Mood normal.     ED Results / Procedures / Treatments   Labs (all labs ordered are listed, but only abnormal results are displayed) Labs Reviewed  BASIC METABOLIC PANEL - Abnormal; Notable for the following components:      Result Value   CO2 17 (*)    Glucose, Bld 110 (*)    Calcium  8.3 (*)    Anion gap 16 (*)    All other components within normal limits  ETHANOL - Abnormal; Notable for the following components:   Alcohol , Ethyl (B) 196 (*)    All other components within normal limits  CBC WITH DIFFERENTIAL/PLATELET  HEPATIC FUNCTION PANEL  LIPASE, BLOOD  URINALYSIS, ROUTINE W REFLEX MICROSCOPIC    EKG EKG Interpretation Date/Time:  Wednesday April 11 2023 20:28:52 EST Ventricular Rate:  153 PR Interval:  120 QRS Duration:  93 QT Interval:  272 QTC Calculation: 434 R Axis:   -11  Text Interpretation: Supraventricular tachycardia Abnormal R-wave progression, late transition Baseline wander in lead(s) I II aVR V2 Confirmed by Ruthe Cornet [phone]) on 04/11/2023 8:55:21 PM  Radiology No results found.  Procedures Procedures    Medications Ordered in ED Medications  LORazepam  (ATIVAN ) tablet 1-4 mg (has no administration in time range)    Or  LORazepam  (ATIVAN ) injection 1-4 mg (has no administration in time range)  thiamine  (VITAMIN B1) tablet 100 mg (100 mg Oral Given 04/11/23 2134)    Or  thiamine  (VITAMIN B1) injection 100 mg ( Intravenous See Alternative 04/11/23 2134)  folic acid  (FOLVITE ) tablet 1 mg (1 mg Oral Given 04/11/23 2134)  multivitamin  with minerals tablet 1 tablet (1 tablet Oral Given 04/11/23 2134)  LORazepam  (ATIVAN ) injection 2 mg (has no administration in time range)  ondansetron  (ZOFRAN ) injection 4 mg (4 mg Intravenous Given 04/11/23 2105)  sodium chloride  0.9 % bolus 1,000 mL (1,000 mLs Intravenous New Bag/Given 04/11/23 2105)  LORazepam  (ATIVAN ) injection 2 mg (2 mg Intravenous Given 04/11/23 2135)    ED Course/ Medical Decision Making/ A&P  Medical Decision Making Amount and/or Complexity of Data Reviewed Labs: ordered. Radiology: ordered.  Risk OTC drugs. Prescription drug management. Decision regarding hospitalization.   Charlie FORBES Gainer is here with alcohol  withdrawal symptoms.  He is tachycardic with SVT it looks like in the 150s on EKG but on my evaluation is back in a sinus rhythm in the 120s.  He has tremors from diaphoresis.  He has been dry heaving not able to tolerate alcohol  as well today despite drinking today.  He feels like he is having withdrawals which she has a history of.  I gave him 2 mg IV Ativan  and symptoms have improved.  He has had several admissions for alcohol  withdrawal.  He seems to become very tremulous and symptomatic pretty quickly but he has not had any seizures today or hallucinations.  Overall he does not really feel comfortable given that he is dry heaving but not having any abdominal pain.  He states that he has been to rehab several times but continues to not do well when he finishes rehab.  He has been dealing with some hematuria recently.  He supposed to follow-up with urology.  He denies any retention.  Lab work was collected including CBC BMP chest x-ray urinalysis.  Overall differential diagnosis likely symptoms from alcohol  use.  He is exhibiting some withdrawal but does not appear to be in DTs but he is very high risk for this given his clinical history.  He has been very nauseous and gagging.  Has been given Zofran .  I think it is probably  reasonable to observe him overnight on CIWA protocol make sure he can tolerate p.o. and given my opportunity to maybe try to get sober which she states that he is motivated to do.  Overall he is not having any abdominal pain chest pain or shortness of breath.  Lab work today is actually reassuring.  No significant AKI or hyponatremia.  Alcohol  level is 196.  Will talk with medicine about admission for further care.  Patient to be admitted to medicine for further care.  This chart was dictated using voice recognition software.  Despite best efforts to proofread,  errors can occur which can change the documentation meaning.         Final Clinical Impression(s) / ED Diagnoses Final diagnoses:  Alcohol  withdrawal syndrome with complication [facility])    Rx / DC Orders ED Discharge Orders     None         Ruthe Cornet, DO 04/11/23 2233

## 2023-04-11 NOTE — Hospital Course (Signed)
 Ruben [name] is a 73 y.o. male with medical history significant for severe alcohol  use disorder with history of withdrawals (admitted 6 times since September for alcohol -related complications), HTN, HLD, BPH (s/p transurethral water  jet ablation of prostate 03/27/2023), OSA who is admitted with alcohol  withdrawal.

## 2023-04-11 NOTE — ED Triage Notes (Addendum)
 Pt from home, has catheter removed x 1 weeks , reports blood in urine 2 days after since. Pt with ETOH, per EMS. Pt c/o nausea and vomiting   PER PT , HE IS HERE FOR SOB, AND RACING HR. PT HR IN 160/170 IN TRIAGE, IV ATTEMPTED. PT STRAIGHT BACK TO ROOM , PT REPORTS CONSUMED 100 ML OF VODKA

## 2023-04-11 NOTE — H&P (Signed)
 History and Physical    Ruben [name] FMW:[id number] DOB: [birth year] DOA: 04/11/2023  PCP: [name], FNP  Patient coming from: Home  I have personally briefly reviewed patient's old medical records in [facility]  Chief Complaint: Nausea, alcohol  withdrawal  HPI: Ruben [name] is a 73 y.o. male with medical history significant for severe alcohol  use disorder with history of withdrawals (admitted 6 times since September for alcohol -related complications), HTN, HLD, BPH (s/p transurethral water  jet ablation of prostate 03/27/2023), OSA who presented to the ED for evaluation of nausea, tremors.  Patient reports drinking approximately 700 cc of vodka daily.  He says his last drink was this morning.  He states he has been having significant nausea with frequent dry heaves today.  He has not vomited.  He has had some chills and tremors.  He feels like he is beginning to withdraw from alcohol .  He denies any hallucinations but does report hallucinating in the past during severe withdrawals.  He denies abdominal pain.  He reported seeing some blood in his urine to the ED provider.  ED Course  Labs/Imaging on admission: I have personally reviewed following labs and imaging studies.  Initial vitals showed BP 132/82, pulse 156, RR 15, temp 98.4 F, SpO2 97% on room air.  Labs showed WBC 8.3, hemoglobin 16.1, platelets 326,000, sodium 135, potassium 3.9, bicarb 17, BUN 12, creatinine 1.16, serum glucose 110, LFTs within normal limits, lipase 25.  Serum ethanol 196.  UA pending collection.  Portable chest x-ray negative for focal consolidation, edema, effusion.  Patient was given 1 L normal saline, IV Ativan  2 mg.  The hospitalist service was consulted to admit for further evaluation and management.  Review of Systems: All systems reviewed and are negative except as documented in history of present illness above.   Past Medical History:  Diagnosis Date   Alcoholism (HCC)     Anxiety    Aortic atherosclerosis (HCC) 12/26/2016   Noted on CT   Arthritis    BPH (benign prostatic hyperplasia)    Chronic constipation    takes magnesium    Depression    Duodenal ulcer    Dyspnea 12/17/2017   ED (erectile dysfunction)    GERD (gastroesophageal reflux disease)    Heart palpitations    Occ   Hiatal hernia    History of adenomatous polyp of colon    tubular adenoma's 2005   History of Helicobacter pylori infection    2008   Hypercholesteremia    Hypertension    MR (mitral regurgitation)    Mild, noted on ECHO   Palpitations    Pre-diabetes    Pulmonary nodule 12/26/2016   noted on CT, 5 x 3 mm nodular opacity right upper lobe   Rosacea    Schatzki's ring    last dilated 02/ 2016   Substance abuse (HCC)    Urinary retention    history of   Varicocele 02/25/2014   Small to moderate left varicocele    Past Surgical History:  Procedure Laterality Date   BALLOON DILATION N/A 06/08/2014   Procedure: BALLOON DILATION;  Surgeon: Gladis MARLA Louder, MD;  Location: [facility];  Service: Endoscopy;  Laterality: N/A;   COLONOSCOPY WITH PROPOFOL  N/A 06/08/2014   Procedure: COLONOSCOPY WITH PROPOFOL ;  Surgeon: Gladis MARLA Louder, MD;  Location: [facility];  Service: Endoscopy;  Laterality: N/A;   ESOPHAGOGASTRODUODENOSCOPY N/A 06/08/2014   Procedure: ESOPHAGOGASTRODUODENOSCOPY (EGD);  Surgeon: Gladis MARLA Louder, MD;  Location: [facility];  Service: Endoscopy;  Laterality: N/A;   ESOPHAGOGASTRODUODENOSCOPY (EGD) WITH ESOPHAGEAL DILATION N/A 12/31/2012   Procedure: ESOPHAGOGASTRODUODENOSCOPY (EGD) WITH ESOPHAGEAL DILATION;  Surgeon: Gladis MARLA Louder, MD;  Location: [facility];  Service: Endoscopy;  Laterality: N/A;   GREEN LIGHT LASER TURP (TRANSURETHRAL RESECTION OF PROSTATE N/A 12/28/2015   Procedure: GREEN LIGHT LASER TURP (TRANSURETHRAL RESECTION OF PROSTATE;  Surgeon: Donnice Brooks, MD;  Location: [facility];  Service: Urology;  Laterality:  N/A;   HERNIA REPAIR  04/2018   hiatal   LEFT HEART CATH AND CORONARY ANGIOGRAPHY N/A 05/16/2022   Procedure: LEFT HEART CATH AND CORONARY ANGIOGRAPHY;  Surgeon: Elmira Newman PARAS, MD;  Location: [facility]AS[facility];  Service: Cardiovascular;  Laterality: N/A;    Social History:  reports that he has never smoked. He has never used smokeless tobacco. He reports current alcohol  use of about 154.0 standard drinks of alcohol  per week. He reports that he does not currently use drugs after having used the following drugs: Marijuana.  No Known Allergies  Family History  Problem Relation Age of Onset   Heart failure Mother    Breast cancer Mother    Stroke Father    Other Father        Colon Issues   Lung disease Neg Hx      Prior to Admission medications   Medication Sig Start Date End Date Taking? Authorizing Provider  cephALEXin  (KEFLEX ) 500 MG capsule Take 1 capsule (500 mg total) by mouth at bedtime. 03/27/23   Brooks Donnice, MD  cloNIDine  (CATAPRES ) 0.1 MG tablet Take 1 tablet (0.1 mg total) by mouth 2 (two) times daily. Patient taking differently: Take 0.1 mg by mouth at bedtime. 02/04/23   Gonfa, Taye T, MD  desvenlafaxine (PRISTIQ) 50 MG 24 hr tablet Take 50 mg by mouth daily. 03/05/23   [provider]  finasteride  (PROSCAR ) 5 MG tablet Take 5 mg by mouth daily.    [provider]  folic acid  (FOLVITE ) 1 MG tablet Take 1 tablet (1 mg total) by mouth daily. 03/01/23   Krishnan, Gokul, MD  gabapentin  (NEURONTIN ) 300 MG capsule Take 300 mg by mouth 2 (two) times daily.    [provider]  hydrOXYzine  (ATARAX ) 50 MG tablet Take 50 mg by mouth daily.    [provider]  LORazepam  (ATIVAN ) 0.5 MG tablet Take 0.5 mg by mouth every 8 (eight) hours as needed for anxiety.    [provider]  magnesium  hydroxide (MILK OF MAGNESIA) 400 MG/5ML suspension Take 30 mLs by mouth every other day.    [provider]  metoprolol  tartrate  (LOPRESSOR ) 50 MG tablet Take 50 mg by mouth 2 (two) times daily.    [provider]  oxybutynin  (DITROPAN ) 5 MG tablet Take 1 tablet (5 mg total) by mouth 2 (two) times daily as needed for bladder spasms. 03/27/23   Brooks Donnice, MD  QUEtiapine  (SEROQUEL ) 300 MG tablet Take 300 mg by mouth at bedtime. 12/02/22   [provider]  rosuvastatin  (CRESTOR ) 20 MG tablet Take 20 mg by mouth every Monday, Wednesday, and Friday. 08/23/21   [provider]  tamsulosin  (FLOMAX ) 0.4 MG CAPS capsule Take 0.4 mg by mouth at bedtime.    [provider]    Physical Exam: Vitals:   04/11/23 2029 04/11/23 2030 04/11/23 2100 04/11/23 2130  BP: 132/82 132/82 (!) 147/90 (!) 156/81  Pulse: (!) 160 (!) 159 (!) 111 (!) 106  Resp: 15 15 17  14  Temp: 98.4 F (36.9 C)     TempSrc: Oral     SpO2: 97% 97% 96% 95%   Constitutional: Resting in bed, NAD Eyes: EOMI, lids and conjunctivae normal ENMT: Mucous membranes are moist. Posterior pharynx clear of any exudate or lesions.Normal dentition.  Neck: normal, supple, no masses. Respiratory: clear to auscultation bilaterally, no wheezing, no crackles. Normal respiratory effort. No accessory muscle use.  Cardiovascular: Tachycardic, no murmurs / rubs / gallops. No extremity edema. 2+ pedal pulses. Abdomen: no tenderness, no masses palpated.  Musculoskeletal: no clubbing / cyanosis. No joint deformity upper and lower extremities. Good ROM, no contractures. Normal muscle tone.  Skin: no rashes, lesions, ulcers. No induration Neurologic: Mild tremor of hands and legs.  Sensation intact. Strength 5/5 in all 4.  Psychiatric: Alert and oriented x 3. Normal mood.  Denies hallucinations.  EKG: Personally reviewed. Sinus tachycardia, rate 153.  Similar to previous.  Assessment/Plan Principal Problem:   Alcohol  dependence with withdrawal, unspecified (HCC) Active Problems:   BPH (benign prostatic hyperplasia)   Essential  hypertension   Hyperlipidemia   Depression   Ruben [name] is a 73 y.o. male with medical history significant for severe alcohol  use disorder with history of withdrawals (admitted 6 times since September for alcohol -related complications), HTN, HLD, BPH (s/p transurethral water  jet ablation of prostate 03/27/2023), OSA who is admitted with alcohol  withdrawal.  Assessment and Plan: Severe alcohol  use disorder with withdrawal: Patient reports drinking approximately 7 or cc vodka daily, last drink morning of 04/11/2023.  Has been experiencing chronic use and tremors.  Denies hallucinations.  Feels like he is withdrawing from alcohol .  This is his seventh admission for alcohol -related issues since September 2024. -Started on Librium  taper -CIWA protocol with Ativan  as needed -Continue thiamine , folate, MVM -Consult to [facility]  BPH: S/p aquajet ablation 03/27/2023.  Patient reports Foley catheter removed about 1 week ago and reports some recent hematuria.  Hemoglobin is stable. -Obtain urinalysis -Continue tamsulosin  and finasteride   Hypertension: BP is stable.  Continue Lopressor  50 mg twice daily.  Holding clonidine .  Hyperlipidemia: Continue rosuvastatin .  Mood disorder/depression: Continue desvenlafaxine and Seroquel .  OSA: Patient reports he has a CPAP machine at home but not using it routinely.   DVT prophylaxis: enoxaparin  (LOVENOX ) injection 40 mg Start: 04/12/23 1000 Code Status: Full code, discussed with patient on admission Family Communication: Discussed with patient, he has discussed with family Disposition Plan: From home and likely discharge to home pending clinical progress Consults called: None Severity of Illness: The appropriate patient status for this patient is OBSERVATION. Observation status is judged to be reasonable and necessary in order to provide the required intensity of service to ensure the patient's safety. The patient's presenting symptoms, physical exam  findings, and initial radiographic and laboratory data in the context of their medical condition is felt to place them at decreased risk for further clinical deterioration. Furthermore, it is anticipated that the patient will be medically stable for discharge from the hospital within 2 midnights of admission.   Jorie Blanch MD [facility]  If 7PM-7AM, please contact night-coverage [email or web address]  04/12/2023, 12:16 AM

## 2023-04-12 DIAGNOSIS — F10230 Alcohol dependence with withdrawal, uncomplicated: Secondary | ICD-10-CM | POA: Diagnosis not present

## 2023-04-12 LAB — BASIC METABOLIC PANEL
Anion gap: 9 (ref 5–15)
BUN: 12 mg/dL (ref 8–23)
CO2: 21 mmol/L — ABNORMAL LOW (ref 22–32)
Calcium: 7.6 mg/dL — ABNORMAL LOW (ref 8.9–10.3)
Chloride: 103 mmol/L (ref 98–111)
Creatinine, Ser: 1 mg/dL (ref 0.61–1.24)
GFR, Estimated: >60 mL/min (ref >60)
Glucose, Bld: 94 mg/dL (ref 70–99)
Potassium: 4.2 mmol/L (ref 3.5–5.1)
Sodium: 133 mmol/L — ABNORMAL LOW (ref 135–145)

## 2023-04-12 LAB — URINALYSIS, ROUTINE W REFLEX MICROSCOPIC
Bacteria, UA: NONE SEEN
Bilirubin Urine: NEGATIVE
Glucose, UA: NEGATIVE mg/dL
Ketones, ur: NEGATIVE mg/dL
Nitrite: NEGATIVE
Protein, ur: 100 mg/dL — AB
RBC / HPF: >50 RBC/hpf (ref 0–5)
Specific Gravity, Urine: 1.014 (ref 1.005–1.030)
pH: 6 (ref 5.0–8.0)

## 2023-04-12 LAB — CBC
HCT: 39.7 % (ref 39.0–52.0)
Hemoglobin: 13.4 g/dL (ref 13.0–17.0)
MCH: 30.8 pg (ref 26.0–34.0)
MCHC: 33.8 g/dL (ref 30.0–36.0)
MCV: 91.3 fL (ref 80.0–100.0)
Platelets: 212 10*3/uL (ref 150–400)
RBC: 4.35 MIL/uL (ref 4.22–5.81)
RDW: 14.9 % (ref 11.5–15.5)
WBC: 6 10*3/uL (ref 4.0–10.5)
nRBC: 0 % (ref 0.0–0.2)

## 2023-04-12 MED ORDER — VENLAFAXINE HCL ER 75 MG PO CP24
75.0000 mg | ORAL_CAPSULE | Freq: Every day | ORAL | Status: DC
  Administered 2023-04-12: 75 mg via ORAL
  Filled 2023-04-12: qty 1

## 2023-04-12 MED ORDER — METOPROLOL TARTRATE 25 MG PO TABS
50.0000 mg | ORAL_TABLET | Freq: Two times a day (BID) | ORAL | Status: DC
  Administered 2023-04-12 – 2023-04-13 (×4): 50 mg via ORAL
  Filled 2023-04-12 (×4): qty 2

## 2023-04-12 MED ORDER — ROSUVASTATIN CALCIUM 20 MG PO TABS
20.0000 mg | ORAL_TABLET | ORAL | Status: DC
Start: 2023-04-13 — End: 2023-04-13
  Administered 2023-04-13: 20 mg via ORAL
  Filled 2023-04-12: qty 1

## 2023-04-12 MED ORDER — FINASTERIDE 5 MG PO TABS
5.0000 mg | ORAL_TABLET | Freq: Every day | ORAL | Status: DC
  Administered 2023-04-12 – 2023-04-13 (×2): 5 mg via ORAL
  Filled 2023-04-12 (×2): qty 1

## 2023-04-12 MED ORDER — TAMSULOSIN HCL 0.4 MG PO CAPS
0.4000 mg | ORAL_CAPSULE | Freq: Every day | ORAL | Status: DC
  Administered 2023-04-12 (×2): 0.4 mg via ORAL
  Filled 2023-04-12 (×2): qty 1

## 2023-04-12 MED ORDER — QUETIAPINE FUMARATE 100 MG PO TABS
300.0000 mg | ORAL_TABLET | Freq: Every day | ORAL | Status: DC
Start: 2023-04-12 — End: 2023-04-13
  Administered 2023-04-12 (×2): 300 mg via ORAL
  Filled 2023-04-12 (×2): qty 3

## 2023-04-12 NOTE — Progress Notes (Signed)
 [facility] Progress Note Patient: Ruben [name] FMW:[id number] DOB: [birth year] DOA: 04/11/2023  DOS: the patient was seen and examined on 04/12/2023  Brief Hospital Course: Ruben [name] is a 73 y.o. male with medical history significant for severe alcohol  use disorder with history of withdrawals (admitted 6 times since September for alcohol -related complications), HTN, HLD, BPH (s/p transurethral water  jet ablation of prostate 03/27/2023), OSA who is admitted with alcohol  withdrawal.   Assessment and plan.  Severe alcohol  use disorder with withdrawal: Patient reports drinking approximately 7 or cc vodka daily, last drink morning of 04/11/2023.  Has been experiencing chronic use and tremors.  Denies hallucinations.  Feels like he is withdrawing from alcohol .  This is his seventh admission for alcohol -related issues since September 2024. Continue Librium  taper. Continue Ativan  as needed. Has significant hallucination mostly visual. Monitor. At risk for delirium tremens.   BPH: S/p aquajet ablation 03/27/2023.  Patient reports Foley catheter removed about 1 week ago and reports some recent hematuria.  Hemoglobin is stable. Continue tamsulosin  and finasteride    Hypertension: BP is stable.  Continue Lopressor  50 mg twice daily.  Holding clonidine .   Hyperlipidemia: Continue rosuvastatin .   Mood disorder/depression: Continue desvenlafaxine and Seroquel .   OSA: Patient reports he has a CPAP machine at home but not using it routinely.  Subjective: No nausea or vomiting.  Reports that he actually had a visual hallucination where he felt that the floor was upside down.  Currently denies any other acute complaint.  Was able to stay off of the oxygen with saturation at 97%.  Physical Exam: General: in Mild distress, No Rash Cardiovascular: S1 and S2 Present, No Murmur Respiratory: Good respiratory effort, Bilateral Air entry present. No Crackles, No wheezes Abdomen: Bowel Sound present,  No tenderness Extremities: No edema Neuro: Alert and oriented x3, no new focal deficit  Data Reviewed: I have Reviewed nursing notes, Vitals, and Lab results. Since last encounter, pertinent lab results CBC and BMP   . I have ordered test including CBC and BMP  .   Disposition: Status is: Observation enoxaparin  (LOVENOX ) injection 40 mg Start: 04/12/23 1000   Family Communication: No one at bedside Level of care: Progressive continue to observe in progressive care. Vitals:   04/12/23 1700 04/12/23 1800 04/12/23 1803 04/12/23 2000  BP: 106/75 (!) 125/114  123/79  Pulse: 66 74  73  Resp: 16   13  Temp:   98.2 F (36.8 C)   TempSrc:   Oral   SpO2: 96%   94%     Author: Yetta Blanch, MD 04/12/2023 9:07 PM  Please look on [email or web address] to find out who is on call.

## 2023-04-13 ENCOUNTER — Other Ambulatory Visit: Payer: Self-pay

## 2023-04-13 ENCOUNTER — Encounter (HOSPITAL_COMMUNITY): Payer: Self-pay | Admitting: Internal Medicine

## 2023-04-13 DIAGNOSIS — F10230 Alcohol dependence with withdrawal, uncomplicated: Secondary | ICD-10-CM | POA: Diagnosis not present

## 2023-04-13 DIAGNOSIS — R310 Gross hematuria: Secondary | ICD-10-CM | POA: Diagnosis not present

## 2023-04-13 DIAGNOSIS — N401 Enlarged prostate with lower urinary tract symptoms: Secondary | ICD-10-CM | POA: Diagnosis not present

## 2023-04-13 DIAGNOSIS — R3914 Feeling of incomplete bladder emptying: Secondary | ICD-10-CM | POA: Diagnosis not present

## 2023-04-13 LAB — COMPREHENSIVE METABOLIC PANEL
ALT: 27 U/L (ref 0–44)
AST: 34 U/L (ref 15–41)
Albumin: 3.1 g/dL — ABNORMAL LOW (ref 3.5–5.0)
Alkaline Phosphatase: 51 U/L (ref 38–126)
Anion gap: 11 (ref 5–15)
BUN: 15 mg/dL (ref 8–23)
CO2: 23 mmol/L (ref 22–32)
Calcium: 8.4 mg/dL — ABNORMAL LOW (ref 8.9–10.3)
Chloride: 104 mmol/L (ref 98–111)
Creatinine, Ser: 1 mg/dL (ref 0.61–1.24)
GFR, Estimated: >60 mL/min (ref >60)
Glucose, Bld: 94 mg/dL (ref 70–99)
Potassium: 4.7 mmol/L (ref 3.5–5.1)
Sodium: 138 mmol/L (ref 135–145)
Total Bilirubin: 1.5 mg/dL — ABNORMAL HIGH (ref 0.0–1.2)
Total Protein: 5.9 g/dL — ABNORMAL LOW (ref 6.5–8.1)

## 2023-04-13 LAB — CBC
HCT: 39.4 % (ref 39.0–52.0)
Hemoglobin: 13.3 g/dL (ref 13.0–17.0)
MCH: 30.9 pg (ref 26.0–34.0)
MCHC: 33.8 g/dL (ref 30.0–36.0)
MCV: 91.4 fL (ref 80.0–100.0)
Platelets: 198 10*3/uL (ref 150–400)
RBC: 4.31 MIL/uL (ref 4.22–5.81)
RDW: 14.7 % (ref 11.5–15.5)
WBC: 4.9 10*3/uL (ref 4.0–10.5)
nRBC: 0 % (ref 0.0–0.2)

## 2023-04-13 LAB — MAGNESIUM: Magnesium: 2.1 mg/dL (ref 1.7–2.4)

## 2023-04-13 MED ORDER — CHLORDIAZEPOXIDE HCL 25 MG PO CAPS: ORAL_CAPSULE | ORAL | 0 refills | Status: AC

## 2023-04-13 MED ORDER — VITAMIN B-1 100 MG PO TABS: 100.0000 mg | ORAL_TABLET | Freq: Every day | ORAL | 1 refills | Status: DC

## 2023-04-13 NOTE — ED Notes (Signed)
 Patient is resting comfortably.

## 2023-04-13 NOTE — Care Management Obs Status (Signed)
 MEDICARE OBSERVATION STATUS NOTIFICATION   Patient Details  Name: Ruben [name] MRN: [id number] Date of Birth: 01/16/1951   Medicare Observation Status Notification Given:  Yes    Princella Ion, LCSW 04/13/2023, 8:49 AM

## 2023-04-13 NOTE — Discharge Summary (Signed)
 [facility]  Physician Discharge Summary   Patient ID: Ruben [name] MRN: [id number] DOB/AGE: 1950/06/12 73 y.o.  Admit date: 04/11/2023 Discharge date: 04/13/2023    PCP: [name], FNP  DISCHARGE DIAGNOSES:    Alcohol  dependence with withdrawal, unspecified (HCC)   BPH (benign prostatic hyperplasia)   Essential hypertension   Hyperlipidemia   Depression   RECOMMENDATIONS FOR OUTPATIENT FOLLOW UP: Follow-up with outpatient provider   Home Health: None Equipment/Devices: None  CODE STATUS: Full code  DISCHARGE CONDITION: fair  Diet recommendation: As before  INITIAL HISTORY: 73 y.o. male with medical history significant for severe alcohol  use disorder with history of withdrawals (admitted 6 times since September for alcohol -related complications), HTN, HLD, BPH (s/p transurethral water  jet ablation of prostate 03/27/2023), OSA who is admitted with alcohol  withdrawal.   HOSPITAL COURSE:   Severe alcohol  use disorder with withdrawal: Patient reports drinking approximately 7 or cc vodka daily, last drink morning of 04/11/2023.  Has been experiencing chronic use and tremors.  Started on CIWA protocol.  Changed over to Librium .  Feels better this morning.  Okay for discharge.     BPH: S/p aquajet ablation 03/27/2023.  Patient reports Foley catheter removed about 1 week ago and reports some recent hematuria. Continue tamsulosin  and finasteride    Hypertension: Hyperlipidemia:  Mood disorder/depression: Continue desvenlafaxine and Seroquel .   OSA: Patient reports he has a CPAP machine at home but not using it routinely.  Patient is stable.  Okay discharge home today.  PERTINENT LABS:  The results of significant diagnostics from this hospitalization (including imaging, microbiology, ancillary and laboratory) are listed below for reference.    Labs:   Basic Metabolic Panel: Recent Labs  Lab 04/11/23 2049 04/12/23 0533 04/13/23 0445  NA 135 133* 138   K 3.9 4.2 4.7  CL 102 103 104  CO2 17* 21* 23  GLUCOSE 110* 94 94  BUN 12 12 15   CREATININE 1.16 1.00 1.00  CALCIUM  8.3* 7.6* 8.4*  MG  --   --  2.1   Liver Function Tests: Recent Labs  Lab 04/11/23 2049 04/13/23 0445  AST 31 34  ALT 34 27  ALKPHOS 68 51  BILITOT 0.6 1.5*  PROT 7.7 5.9*  ALBUMIN 4.1 3.1*   Recent Labs  Lab 04/11/23 2049  LIPASE 25    CBC: Recent Labs  Lab 04/11/23 2049 04/12/23 0533 04/13/23 0445  WBC 8.3 6.0 4.9  NEUTROABS 5.0  --   --   HGB 16.1 13.4 13.3  HCT 46.6 39.7 39.4  MCV 89.4 91.3 91.4  PLT 326 212 198     IMAGING STUDIES DG Chest Portable 1 View Result Date: 04/11/2023 CLINICAL DATA:  Short of breath, chest tightness EXAM: PORTABLE CHEST 1 VIEW COMPARISON:  02/26/2023 FINDINGS: Single frontal view of the chest demonstrates a stable cardiac silhouette. No acute airspace disease, effusion, or pneumothorax. No acute bony abnormality. IMPRESSION: 1. No acute intrathoracic process. Electronically Signed   By: Ozell Daring M.D.   On: 04/11/2023 21:45   DG ESOPHAGUS W DOUBLE CM (HD) Result Date: 03/15/2023 CLINICAL DATA:  73 year old gentleman with history of prior Nissen fundoplication returns to radiology with history of difficulty swallowing pills. EXAM: ESOPHAGUS/BARIUM SWALLOW/TABLET STUDY TECHNIQUE: Combined double and single contrast examination was performed using effervescent crystals, high-density barium, and thin liquid barium. FLUOROSCOPY: Radiation Exposure Index (as provided by the fluoroscopic device): 24 mGy Kerma COMPARISON:  04/27/2018 FINDINGS: Swallowing: Appears normal. No vestibular penetration or aspiration seen. Pharynx: Prominent cricopharyngeal  bar is noted. Esophagus: No fixed stenosis of the esophagus. Postsurgical changes of Nissen fundoplication again seen at the gastroesophageal junction. Esophageal motility: Moderately reduced esophageal dysmotility with mild esophageal spasm. Hiatal Hernia: None. Gastroesophageal  reflux: Severe gastroesophageal reflux to the level of the upper esophagus. Ingested 13 mm barium tablet: Passed normally Other: None. IMPRESSION: 1. Moderate esophageal dysmotility. 2. Severe gastroesophageal reflux. Electronically Signed   By: Aliene Lloyd M.D.   On: 03/15/2023 12:23    DISCHARGE EXAMINATION: Vitals:   04/13/23 0914 04/13/23 1043 04/13/23 1049 04/13/23 1053  BP:  (!) 99/52 134/74 134/74  Pulse:  72 89 89  Resp:  17 17   Temp: 98.6 F (37 C)  98.4 F (36.9 C)   TempSrc: Oral  Oral   SpO2:  94% 96%   Weight:      Height:       General appearance: Awake alert.  In no distress Resp: Clear to auscultation bilaterally.  Normal effort Cardio: S1-S2 is normal regular.  No S3-S4.  No rubs murmurs or bruit GI: Abdomen is soft.  Nontender nondistended.  Bowel sounds are present normal.  No masses organomegaly .    DISPOSITION: Home  Discharge Instructions     Call MD for:  difficulty breathing, headache or visual disturbances   Complete by: As directed    Call MD for:  extreme fatigue   Complete by: As directed    Call MD for:  persistant dizziness or light-headedness   Complete by: As directed    Call MD for:  persistant nausea and vomiting   Complete by: As directed    Call MD for:  severe uncontrolled pain   Complete by: As directed    Call MD for:  temperature >100.4   Complete by: As directed    Diet - low sodium heart healthy   Complete by: As directed    Discharge instructions   Complete by: As directed    Take your medications as prescribed.  Please be sure to follow-up with your urologist and your primary care provider.  You were cared for by a hospitalist during your hospital stay. If you have any questions about your discharge medications or the care you received while you were in the hospital after you are discharged, you can call the unit and asked to speak with the hospitalist on call if the hospitalist that took care of you is not available. Once  you are discharged, your primary care physician will handle any further medical issues. Please note that NO REFILLS for any discharge medications will be authorized once you are discharged, as it is imperative that you return to your primary care physician (or establish a relationship with a primary care physician if you do not have one) for your aftercare needs so that they can reassess your need for medications and monitor your lab values. If you do not have a primary care physician, you can call [phone] for a physician referral.   Increase activity slowly   Complete by: As directed          Allergies as of 04/13/2023   No Known Allergies      Medication List     STOP taking these medications    LORazepam  0.5 MG tablet Commonly known as: ATIVAN        TAKE these medications    cephALEXin  500 MG capsule Commonly known as: KEFLEX  Take 1 capsule (500 mg total) by mouth at bedtime.   chlordiazePOXIDE  25 MG  capsule Commonly known as: LIBRIUM  Take 1 capsule (25 mg total) by mouth 3 (three) times daily for 3 days, THEN 1 capsule (25 mg total) 2 (two) times daily for 3 days, THEN 1 capsule (25 mg total) daily for 3 days. Start taking on: April 13, 2023   cloNIDine  0.1 MG tablet Commonly known as: CATAPRES  Take 1 tablet (0.1 mg total) by mouth 2 (two) times daily. What changed: when to take this   desvenlafaxine 50 MG 24 hr tablet Commonly known as: PRISTIQ Take 50 mg by mouth daily.   finasteride  5 MG tablet Commonly known as: PROSCAR  Take 5 mg by mouth daily.   folic acid  1 MG tablet Commonly known as: FOLVITE  Take 1 tablet (1 mg total) by mouth daily.   gabapentin  300 MG capsule Commonly known as: NEURONTIN  Take 300 mg by mouth 2 (two) times daily.   hydrOXYzine  50 MG tablet Commonly known as: ATARAX  Take 50 mg by mouth daily.   metoprolol  tartrate 50 MG tablet Commonly known as: LOPRESSOR  Take 50 mg by mouth 2 (two) times daily.   Milk of Magnesia 2400  MG/30ML suspension Generic drug: magnesium  hydroxide Take 30 mLs by mouth every other day.   oxybutynin  5 MG tablet Commonly known as: DITROPAN  Take 1 tablet (5 mg total) by mouth 2 (two) times daily as needed for bladder spasms.   QUEtiapine  300 MG tablet Commonly known as: SEROQUEL  Take 300 mg by mouth at bedtime.   rosuvastatin  20 MG tablet Commonly known as: CRESTOR  Take 20 mg by mouth every Monday, Wednesday, and Friday.   tamsulosin  0.4 MG Caps capsule Commonly known as: FLOMAX  Take 0.4 mg by mouth at bedtime.   thiamine  100 MG tablet Commonly known as: Vitamin B-1 Take 1 tablet (100 mg total) by mouth daily.          Follow-up Information     [name], FNP. Schedule an appointment as soon as possible for a visit in 1 week(s).   Specialty: Family Medicine Why: post hospitalization follow up Contact information: [address] [place] [address] [phone]                 TOTAL DISCHARGE TIME: 35 minutes  Fabienne Nolasco Verdene  [facility] Pager on [email or web address]  04/14/2023, 11:16 AM

## 2023-04-13 NOTE — Progress Notes (Signed)
 TOC consulted for substance abuse resources. Resources attached to AVS. No further TOC needs.

## 2023-04-15 DIAGNOSIS — F10939 Alcohol use, unspecified with withdrawal, unspecified: Secondary | ICD-10-CM | POA: Diagnosis not present

## 2023-04-15 DIAGNOSIS — I498 Other specified cardiac arrhythmias: Secondary | ICD-10-CM | POA: Diagnosis not present

## 2023-04-15 DIAGNOSIS — N401 Enlarged prostate with lower urinary tract symptoms: Secondary | ICD-10-CM | POA: Diagnosis not present

## 2023-04-15 DIAGNOSIS — F1020 Alcohol dependence, uncomplicated: Secondary | ICD-10-CM | POA: Diagnosis not present

## 2023-04-15 DIAGNOSIS — E8729 Other acidosis: Secondary | ICD-10-CM | POA: Diagnosis not present

## 2023-04-15 DIAGNOSIS — R0902 Hypoxemia: Secondary | ICD-10-CM | POA: Diagnosis not present

## 2023-04-15 DIAGNOSIS — R1084 Generalized abdominal pain: Secondary | ICD-10-CM | POA: Diagnosis not present

## 2023-04-15 DIAGNOSIS — K5900 Constipation, unspecified: Secondary | ICD-10-CM | POA: Diagnosis not present

## 2023-04-15 DIAGNOSIS — N400 Enlarged prostate without lower urinary tract symptoms: Secondary | ICD-10-CM | POA: Diagnosis not present

## 2023-04-15 DIAGNOSIS — R338 Other retention of urine: Secondary | ICD-10-CM | POA: Diagnosis not present

## 2023-04-15 DIAGNOSIS — F10239 Alcohol dependence with withdrawal, unspecified: Secondary | ICD-10-CM | POA: Diagnosis not present

## 2023-04-15 DIAGNOSIS — R000 Tachycardia, unspecified: Secondary | ICD-10-CM | POA: Diagnosis not present

## 2023-04-15 DIAGNOSIS — F1090 Alcohol use, unspecified, uncomplicated: Secondary | ICD-10-CM | POA: Diagnosis not present

## 2023-04-15 DIAGNOSIS — I10 Essential (primary) hypertension: Secondary | ICD-10-CM | POA: Diagnosis not present

## 2023-04-15 DIAGNOSIS — E785 Hyperlipidemia, unspecified: Secondary | ICD-10-CM | POA: Diagnosis not present

## 2023-04-15 DIAGNOSIS — Z79899 Other long term (current) drug therapy: Secondary | ICD-10-CM | POA: Diagnosis not present

## 2023-04-15 DIAGNOSIS — G4700 Insomnia, unspecified: Secondary | ICD-10-CM | POA: Diagnosis not present

## 2023-04-15 DIAGNOSIS — R4182 Altered mental status, unspecified: Secondary | ICD-10-CM | POA: Diagnosis not present

## 2023-04-15 DIAGNOSIS — F1010 Alcohol abuse, uncomplicated: Secondary | ICD-10-CM | POA: Diagnosis not present

## 2023-04-20 DIAGNOSIS — R000 Tachycardia, unspecified: Secondary | ICD-10-CM | POA: Diagnosis not present

## 2023-04-20 DIAGNOSIS — F1020 Alcohol dependence, uncomplicated: Secondary | ICD-10-CM | POA: Diagnosis not present

## 2023-04-20 DIAGNOSIS — F10121 Alcohol abuse with intoxication delirium: Secondary | ICD-10-CM | POA: Diagnosis not present

## 2023-04-20 DIAGNOSIS — E871 Hypo-osmolality and hyponatremia: Secondary | ICD-10-CM | POA: Diagnosis not present

## 2023-04-20 DIAGNOSIS — I10 Essential (primary) hypertension: Secondary | ICD-10-CM | POA: Diagnosis not present

## 2023-04-20 DIAGNOSIS — K219 Gastro-esophageal reflux disease without esophagitis: Secondary | ICD-10-CM | POA: Diagnosis not present

## 2023-04-20 DIAGNOSIS — E8729 Other acidosis: Secondary | ICD-10-CM | POA: Diagnosis not present

## 2023-04-20 DIAGNOSIS — I491 Atrial premature depolarization: Secondary | ICD-10-CM | POA: Diagnosis not present

## 2023-04-20 DIAGNOSIS — F10239 Alcohol dependence with withdrawal, unspecified: Secondary | ICD-10-CM | POA: Diagnosis not present

## 2023-04-20 DIAGNOSIS — E860 Dehydration: Secondary | ICD-10-CM | POA: Diagnosis not present

## 2023-04-20 DIAGNOSIS — E785 Hyperlipidemia, unspecified: Secondary | ICD-10-CM | POA: Diagnosis not present

## 2023-04-20 DIAGNOSIS — F10221 Alcohol dependence with intoxication delirium: Secondary | ICD-10-CM | POA: Diagnosis not present

## 2023-04-20 DIAGNOSIS — F10929 Alcohol use, unspecified with intoxication, unspecified: Secondary | ICD-10-CM | POA: Diagnosis not present

## 2023-04-25 ENCOUNTER — Other Ambulatory Visit: Payer: Self-pay

## 2023-04-25 ENCOUNTER — Emergency Department (HOSPITAL_COMMUNITY)
Admission: EM | Admit: 2023-04-25 | Discharge: 2023-04-26 | Disposition: A | Discharge status: Home / Self Care | Payer: Medicare HMO | Attending: Emergency Medicine | Admitting: Emergency Medicine

## 2023-04-25 ENCOUNTER — Encounter (HOSPITAL_COMMUNITY): Payer: Self-pay

## 2023-04-25 DIAGNOSIS — F10129 Alcohol abuse with intoxication, unspecified: Secondary | ICD-10-CM | POA: Diagnosis not present

## 2023-04-25 DIAGNOSIS — F10920 Alcohol use, unspecified with intoxication, uncomplicated: Secondary | ICD-10-CM

## 2023-04-25 DIAGNOSIS — F19939 Other psychoactive substance use, unspecified with withdrawal, unspecified: Secondary | ICD-10-CM | POA: Diagnosis not present

## 2023-04-25 DIAGNOSIS — F10120 Alcohol abuse with intoxication, uncomplicated: Secondary | ICD-10-CM | POA: Diagnosis not present

## 2023-04-25 DIAGNOSIS — R059 Cough, unspecified: Secondary | ICD-10-CM | POA: Diagnosis not present

## 2023-04-25 DIAGNOSIS — R000 Tachycardia, unspecified: Secondary | ICD-10-CM | POA: Insufficient documentation

## 2023-04-25 DIAGNOSIS — Y907 Blood alcohol level of 200-239 mg/100 ml: Secondary | ICD-10-CM | POA: Insufficient documentation

## 2023-04-25 DIAGNOSIS — R079 Chest pain, unspecified: Secondary | ICD-10-CM | POA: Diagnosis present

## 2023-04-25 DIAGNOSIS — R58 Hemorrhage, not elsewhere classified: Secondary | ICD-10-CM | POA: Diagnosis not present

## 2023-04-25 DIAGNOSIS — F1020 Alcohol dependence, uncomplicated: Secondary | ICD-10-CM | POA: Diagnosis present

## 2023-04-25 DIAGNOSIS — F10929 Alcohol use, unspecified with intoxication, unspecified: Secondary | ICD-10-CM | POA: Diagnosis not present

## 2023-04-25 LAB — CBC WITH DIFFERENTIAL/PLATELET
Abs Immature Granulocytes: 0.02 10*3/uL (ref 0.00–0.07)
Basophils Absolute: 0.1 10*3/uL (ref 0.0–0.1)
Basophils Relative: 1 %
Eosinophils Absolute: 0.2 10*3/uL (ref 0.0–0.5)
Eosinophils Relative: 2 %
HCT: 47.2 % (ref 39.0–52.0)
Hemoglobin: 15.6 g/dL (ref 13.0–17.0)
Immature Granulocytes: 0 %
Lymphocytes Relative: 19 %
Lymphs Abs: 1.7 10*3/uL (ref 0.7–4.0)
MCH: 29.5 pg (ref 26.0–34.0)
MCHC: 33.1 g/dL (ref 30.0–36.0)
MCV: 89.4 fL (ref 80.0–100.0)
Monocytes Absolute: 0.5 10*3/uL (ref 0.1–1.0)
Monocytes Relative: 6 %
Neutro Abs: 6.3 10*3/uL (ref 1.7–7.7)
Neutrophils Relative %: 72 %
Platelets: 188 10*3/uL (ref 150–400)
RBC: 5.28 MIL/uL (ref 4.22–5.81)
RDW: 13.9 % (ref 11.5–15.5)
WBC: 8.7 10*3/uL (ref 4.0–10.5)
nRBC: 0 % (ref 0.0–0.2)

## 2023-04-25 LAB — COMPREHENSIVE METABOLIC PANEL
ALT: 35 U/L (ref 0–44)
AST: 39 U/L (ref 15–41)
Albumin: 3.7 g/dL (ref 3.5–5.0)
Alkaline Phosphatase: 67 U/L (ref 38–126)
Anion gap: 17 — ABNORMAL HIGH (ref 5–15)
BUN: 11 mg/dL (ref 8–23)
CO2: 19 mmol/L — ABNORMAL LOW (ref 22–32)
Calcium: 8.5 mg/dL — ABNORMAL LOW (ref 8.9–10.3)
Chloride: 107 mmol/L (ref 98–111)
Creatinine, Ser: 0.88 mg/dL (ref 0.61–1.24)
GFR, Estimated: >60 mL/min (ref >60)
Glucose, Bld: 82 mg/dL (ref 70–99)
Potassium: 4.2 mmol/L (ref 3.5–5.1)
Sodium: 143 mmol/L (ref 135–145)
Total Bilirubin: 0.7 mg/dL (ref 0.0–1.2)
Total Protein: 7.4 g/dL (ref 6.5–8.1)

## 2023-04-25 LAB — MAGNESIUM: Magnesium: 2.1 mg/dL (ref 1.7–2.4)

## 2023-04-25 LAB — ETHANOL: Alcohol, Ethyl (B): 223 mg/dL — ABNORMAL HIGH (ref <10)

## 2023-04-25 NOTE — ED Triage Notes (Signed)
 Patient brought in by EMS due to alcohol  intoxication and ripping his foley cath out of him. EMS reports lots of blood all over the house upon their arrival. Pt unsure why he has a catheter. Per EMS, patient's HR in the 120's, unsure if this is baseline.

## 2023-04-25 NOTE — ED Provider Notes (Signed)
 [place] EMERGENCY DEPARTMENT AT [facility] Provider Note   CSN: [id number] Arrival date & time: 04/25/23  1737     History Chief Complaint  Patient presents with   Alcohol  Intoxication    HPI Ruben [name] is a 73 y.o. male presenting for chief complaint of intoxication.  History of heavy alcohol  use.  Allegedly multiple drinks today. Foley got caught on something and pulled out the patient does not remember how.  He denies fevers chills nausea vomiting syncope shortness of breath.  Otherwise ambulatory tolerating p.o. intake.   Patient's recorded medical, surgical, social, medication list and allergies were reviewed in the Snapshot window as part of the initial history.   Review of Systems   Review of Systems  Constitutional:  Negative for chills and fever.  HENT:  Negative for ear pain and sore throat.   Eyes:  Negative for pain and visual disturbance.  Respiratory:  Negative for cough and shortness of breath.   Cardiovascular:  Negative for chest pain and palpitations.  Gastrointestinal:  Negative for abdominal pain and vomiting.  Genitourinary:  Negative for dysuria and hematuria.  Musculoskeletal:  Negative for arthralgias and back pain.  Skin:  Negative for color change and rash.  Neurological:  Negative for seizures and syncope.  All other systems reviewed and are negative.   Physical Exam Updated Vital Signs BP (!) 149/90   Pulse (!) 123   Temp 98.5 F (36.9 C) (Oral)   Resp 18   Ht 5\' 11"  (1.803 m)   Wt 97 kg   SpO2 94%   BMI 29.83 kg/m  Physical Exam Vitals and nursing note reviewed.  Constitutional:      General: He is not in acute distress.    Appearance: He is well-developed.  HENT:     Head: Normocephalic and atraumatic.  Eyes:     Conjunctiva/sclera: Conjunctivae normal.  Cardiovascular:     Rate and Rhythm: Normal rate and regular rhythm.  Pulmonary:     Effort: Pulmonary effort is normal. No respiratory distress.   Abdominal:     General: Abdomen is flat. There is no distension.  Musculoskeletal:        General: No swelling or deformity.  Skin:    General: Skin is warm and dry.     Capillary Refill: Capillary refill takes less than 2 seconds.  Neurological:     Mental Status: He is alert and oriented to person, place, and time. Mental status is at baseline.      ED Course/ Medical Decision Making/ A&P    Procedures Procedures   Medications Ordered in ED Medications - No data to display  Medical Decision Making:   2 separate chief complaints.  First concerning alcohol  intoxication, levels drawn, elevated.  No evidence of hepatitis on lab work nor other complication. Will observe till clinical sobriety.  Concerning patient's traumatic foley removal, external exam benign, will replace Foley as it has been placed for chronic urinary retention.  Patient will need to follow-up with urology in the outpatient setting.  BMP to evaluate for renal injury from the period of obstruction, reviewed and benign.  Reassessment: Lab work all focally benign. Foley catheter to be replaced in emergency room. Patient has had 6 admissions for alcohol  withdrawal in the last month and gone through alcohol  detox each time, he states that he does not intend to quit drinking at this time.  Admitting patient to hospital for repeat detox admission when he does not intend to  continue with detox in outpatient setting exposes patient to unnecessary risk of hospitalization and risk of alcohol  withdrawals.  On my repeat evaluation, he is clinically sober stable for outpatient care management.     Clinical Impression:  1. Alcoholic intoxication without complication [facility])      Discharge   Final Clinical Impression(s) / ED Diagnoses Final diagnoses:  Alcoholic intoxication without complication [facility])    Rx / DC Orders ED Discharge Orders     None         Onetha Bile, MD 04/25/23 2315

## 2023-04-26 MED ORDER — LORAZEPAM 0.5 MG PO TABS
0.5000 mg | ORAL_TABLET | Freq: Once | ORAL | Status: AC
  Administered 2023-04-26: 0.5 mg via ORAL
  Filled 2023-04-26: qty 1

## 2023-04-26 MED ORDER — CHLORDIAZEPOXIDE HCL 25 MG PO CAPS
ORAL_CAPSULE | ORAL | 0 refills | Status: DC
Start: 2023-04-26 — End: 2023-07-12

## 2023-04-26 NOTE — ED Notes (Signed)
Cab arranged for pt to ride home, 20 min out.

## 2023-04-26 NOTE — ED Notes (Signed)
Patient verbalizes understanding of discharge instructions. Opportunity for questioning and answers were provided. Armband removed by staff, pt discharged from ED. Awaiting cab ride home

## 2023-04-26 NOTE — ED Provider Notes (Signed)
  Physical Exam  BP (!) 150/96   Pulse (!) 119   Temp 98.2 F (36.8 C) (Oral)   Resp 17   Ht 5\' 11"  (1.803 m)   Wt 97 kg   SpO2 97%   BMI 29.83 kg/m   Physical Exam  Procedures  Procedures  ED Course / MDM    Medical Decision Making Amount and/or Complexity of Data Reviewed Labs: ordered.   Prior to discharge patient requested assistance with alcohol cessation. Prescription sent for librium taper. Patient voiced understanding that he could not drink while taking the librium       Pamala Duffel 04/26/23 0013    Nira Conn, MD 04/26/23 [phone]

## 2023-05-01 DIAGNOSIS — R1310 Dysphagia, unspecified: Secondary | ICD-10-CM | POA: Diagnosis not present

## 2023-05-01 DIAGNOSIS — Z860100 Personal history of colon polyps, unspecified: Secondary | ICD-10-CM | POA: Diagnosis not present

## 2023-05-01 DIAGNOSIS — K219 Gastro-esophageal reflux disease without esophagitis: Secondary | ICD-10-CM | POA: Diagnosis not present

## 2023-05-03 DIAGNOSIS — Z09 Encounter for follow-up examination after completed treatment for conditions other than malignant neoplasm: Secondary | ICD-10-CM | POA: Diagnosis not present

## 2023-05-03 DIAGNOSIS — N1831 Chronic kidney disease, stage 3a: Secondary | ICD-10-CM | POA: Diagnosis not present

## 2023-05-03 DIAGNOSIS — S39012A Strain of muscle, fascia and tendon of lower back, initial encounter: Secondary | ICD-10-CM | POA: Diagnosis not present

## 2023-05-03 DIAGNOSIS — F1021 Alcohol dependence, in remission: Secondary | ICD-10-CM | POA: Diagnosis not present

## 2023-05-03 DIAGNOSIS — K219 Gastro-esophageal reflux disease without esophagitis: Secondary | ICD-10-CM | POA: Diagnosis not present

## 2023-05-03 DIAGNOSIS — I10 Essential (primary) hypertension: Secondary | ICD-10-CM | POA: Diagnosis not present

## 2023-05-03 DIAGNOSIS — E782 Mixed hyperlipidemia: Secondary | ICD-10-CM | POA: Diagnosis not present

## 2023-05-03 DIAGNOSIS — I700 Atherosclerosis of aorta: Secondary | ICD-10-CM | POA: Diagnosis not present

## 2023-05-03 DIAGNOSIS — F331 Major depressive disorder, recurrent, moderate: Secondary | ICD-10-CM | POA: Diagnosis not present

## 2023-05-07 DIAGNOSIS — N401 Enlarged prostate with lower urinary tract symptoms: Secondary | ICD-10-CM | POA: Diagnosis not present

## 2023-05-07 DIAGNOSIS — R3914 Feeling of incomplete bladder emptying: Secondary | ICD-10-CM | POA: Diagnosis not present

## 2023-05-09 DIAGNOSIS — Z9889 Other specified postprocedural states: Secondary | ICD-10-CM | POA: Diagnosis not present

## 2023-05-09 DIAGNOSIS — K2930 Chronic superficial gastritis without bleeding: Secondary | ICD-10-CM | POA: Diagnosis not present

## 2023-05-09 DIAGNOSIS — Q399 Congenital malformation of esophagus, unspecified: Secondary | ICD-10-CM | POA: Diagnosis not present

## 2023-05-09 DIAGNOSIS — R1310 Dysphagia, unspecified: Secondary | ICD-10-CM | POA: Diagnosis not present

## 2023-05-11 DIAGNOSIS — K2930 Chronic superficial gastritis without bleeding: Secondary | ICD-10-CM | POA: Diagnosis not present

## 2023-05-24 DIAGNOSIS — R053 Chronic cough: Secondary | ICD-10-CM | POA: Diagnosis not present

## 2023-05-24 DIAGNOSIS — R062 Wheezing: Secondary | ICD-10-CM | POA: Diagnosis not present

## 2023-05-24 DIAGNOSIS — Z6830 Body mass index (BMI) 30.0-30.9, adult: Secondary | ICD-10-CM | POA: Diagnosis not present

## 2023-05-27 ENCOUNTER — Emergency Department (HOSPITAL_COMMUNITY): Payer: Medicare HMO

## 2023-05-27 ENCOUNTER — Encounter (HOSPITAL_COMMUNITY): Payer: Self-pay

## 2023-05-27 ENCOUNTER — Other Ambulatory Visit: Payer: Self-pay

## 2023-05-27 ENCOUNTER — Emergency Department (HOSPITAL_COMMUNITY)
Admission: EM | Admit: 2023-05-27 | Discharge: 2023-05-28 | Disposition: A | Discharge status: Home / Self Care | Payer: Medicare HMO | Attending: Student | Admitting: Student

## 2023-05-27 DIAGNOSIS — F10129 Alcohol abuse with intoxication, unspecified: Secondary | ICD-10-CM | POA: Diagnosis not present

## 2023-05-27 DIAGNOSIS — R0602 Shortness of breath: Secondary | ICD-10-CM | POA: Diagnosis not present

## 2023-05-27 DIAGNOSIS — F10920 Alcohol use, unspecified with intoxication, uncomplicated: Secondary | ICD-10-CM

## 2023-05-27 DIAGNOSIS — J9811 Atelectasis: Secondary | ICD-10-CM | POA: Diagnosis not present

## 2023-05-27 DIAGNOSIS — I517 Cardiomegaly: Secondary | ICD-10-CM | POA: Diagnosis not present

## 2023-05-27 DIAGNOSIS — Z79899 Other long term (current) drug therapy: Secondary | ICD-10-CM | POA: Insufficient documentation

## 2023-05-27 DIAGNOSIS — R4182 Altered mental status, unspecified: Secondary | ICD-10-CM | POA: Insufficient documentation

## 2023-05-27 DIAGNOSIS — I10 Essential (primary) hypertension: Secondary | ICD-10-CM | POA: Insufficient documentation

## 2023-05-27 DIAGNOSIS — R062 Wheezing: Secondary | ICD-10-CM | POA: Diagnosis not present

## 2023-05-27 DIAGNOSIS — F10120 Alcohol abuse with intoxication, uncomplicated: Secondary | ICD-10-CM | POA: Insufficient documentation

## 2023-05-27 DIAGNOSIS — T17920A Food in respiratory tract, part unspecified causing asphyxiation, initial encounter: Secondary | ICD-10-CM | POA: Diagnosis not present

## 2023-05-27 DIAGNOSIS — R051 Acute cough: Secondary | ICD-10-CM | POA: Diagnosis not present

## 2023-05-27 DIAGNOSIS — Y907 Blood alcohol level of 200-239 mg/100 ml: Secondary | ICD-10-CM | POA: Insufficient documentation

## 2023-05-27 DIAGNOSIS — R000 Tachycardia, unspecified: Secondary | ICD-10-CM | POA: Diagnosis not present

## 2023-05-27 DIAGNOSIS — R0902 Hypoxemia: Secondary | ICD-10-CM | POA: Diagnosis not present

## 2023-05-27 LAB — COMPREHENSIVE METABOLIC PANEL
ALT: 28 U/L (ref 0–44)
AST: 33 U/L (ref 15–41)
Albumin: 3.6 g/dL (ref 3.5–5.0)
Alkaline Phosphatase: 61 U/L (ref 38–126)
Anion gap: 18 — ABNORMAL HIGH (ref 5–15)
BUN: 13 mg/dL (ref 8–23)
CO2: 17 mmol/L — ABNORMAL LOW (ref 22–32)
Calcium: 8.3 mg/dL — ABNORMAL LOW (ref 8.9–10.3)
Chloride: 103 mmol/L (ref 98–111)
Creatinine, Ser: 1.31 mg/dL — ABNORMAL HIGH (ref 0.61–1.24)
GFR, Estimated: 58 mL/min — ABNORMAL LOW (ref >60)
Glucose, Bld: 114 mg/dL — ABNORMAL HIGH (ref 70–99)
Potassium: 4.5 mmol/L (ref 3.5–5.1)
Sodium: 138 mmol/L (ref 135–145)
Total Bilirubin: 1.3 mg/dL — ABNORMAL HIGH (ref 0.0–1.2)
Total Protein: 7.5 g/dL (ref 6.5–8.1)

## 2023-05-27 LAB — CBC
HCT: 47 % (ref 39.0–52.0)
Hemoglobin: 15.4 g/dL (ref 13.0–17.0)
MCH: 29.4 pg (ref 26.0–34.0)
MCHC: 32.8 g/dL (ref 30.0–36.0)
MCV: 89.7 fL (ref 80.0–100.0)
Platelets: 315 10*3/uL (ref 150–400)
RBC: 5.24 MIL/uL (ref 4.22–5.81)
RDW: 13.1 % (ref 11.5–15.5)
WBC: 8.5 10*3/uL (ref 4.0–10.5)
nRBC: 0 % (ref 0.0–0.2)

## 2023-05-27 LAB — MAGNESIUM: Magnesium: 2 mg/dL (ref 1.7–2.4)

## 2023-05-27 LAB — ETHANOL: Alcohol, Ethyl (B): 218 mg/dL — ABNORMAL HIGH (ref <10)

## 2023-05-27 MED ORDER — ALBUTEROL SULFATE HFA 108 (90 BASE) MCG/ACT IN AERS: 2.0000 | INHALATION_SPRAY | RESPIRATORY_TRACT | Status: DC | PRN

## 2023-05-27 MED ORDER — SODIUM CHLORIDE 0.9 % IV BOLUS
1000.0000 mL | Freq: Once | INTRAVENOUS | Status: AC
  Administered 2023-05-28: 1000 mL via INTRAVENOUS

## 2023-05-27 MED ORDER — IPRATROPIUM-ALBUTEROL 0.5-2.5 (3) MG/3ML IN SOLN
3.0000 mL | Freq: Once | RESPIRATORY_TRACT | Status: AC
  Administered 2023-05-28: 3 mL via RESPIRATORY_TRACT
  Filled 2023-05-27: qty 3

## 2023-05-27 NOTE — ED Triage Notes (Signed)
 Pt arrived from [employer] via [facility] originally called out for choking when EMS arrived pt was coughing.  Per restaurant pt had 5 liquor drinks. Pt states that he feels sob

## 2023-05-27 NOTE — ED Notes (Signed)
 Pt unable to void at this time.

## 2023-05-28 LAB — RESP PANEL BY RT-PCR (RSV, FLU A&B, COVID)  RVPGX2
Influenza A by PCR: NEGATIVE
Influenza B by PCR: NEGATIVE
Resp Syncytial Virus by PCR: NEGATIVE
SARS Coronavirus 2 by RT PCR: NEGATIVE

## 2023-05-28 MED ORDER — IPRATROPIUM-ALBUTEROL 0.5-2.5 (3) MG/3ML IN SOLN
3.0000 mL | Freq: Once | RESPIRATORY_TRACT | Status: AC
  Administered 2023-05-28: 3 mL via RESPIRATORY_TRACT
  Filled 2023-05-28: qty 3

## 2023-05-28 MED ORDER — LORAZEPAM 1 MG PO TABS
1.0000 mg | ORAL_TABLET | Freq: Once | ORAL | Status: AC
  Administered 2023-05-28: 1 mg via ORAL
  Filled 2023-05-28: qty 1

## 2023-05-28 NOTE — Discharge Instructions (Addendum)
 Your workup tonight was reassuring.  I have provided a resource guide for outpatient substance abuse counseling.  Please reach out to resources when you are ready to cease alcohol consumption.  If you develop any life threatening symptoms please return to the emergency department.

## 2023-05-28 NOTE — ED Notes (Signed)
 Pt alert and oriented x4, ambulated with an even and steady gait with this RN. Pt evaluated by PA McCauley and deemed safe for discharge at this time

## 2023-05-28 NOTE — ED Provider Notes (Signed)
  EMERGENCY DEPARTMENT AT [facility] Provider Note   CSN: [id number] Arrival date & time: 05/27/23  2255     History  Chief Complaint  Patient presents with   Altered Mental Status   Cough    Ruben [name] is a 73 y.o. male. Patient presents to the emergency department via EMS due to coughing and intoxication.  Patient with past medical history significant for hypertension, GERD, alcohol abuse.  EMS initially called out for choking but when they arrived patient was coughing.  The restaurant stated the patient had 5 "liquor drinks".  He denies any complaints upon my examination.  He does endorse drinking alcohol and states "I have a problem".  He is audibly wheezing.  He denies abdominal pain, nausea, vomiting with chest pain, shortness of breath.   Altered Mental Status Cough      Home Medications Prior to Admission medications   Medication Sig Start Date End Date Taking? Authorizing Provider  cephALEXin (KEFLEX) 500 MG capsule Take 1 capsule (500 mg total) by mouth at bedtime. 03/27/23   Jerilee Field, MD  chlordiazePOXIDE (LIBRIUM) 25 MG capsule 50mg  PO TID x 1D, then 25-50mg  PO BID X 1D, then 25-50mg  PO QD X 1D 04/26/23   Barrie Dunker B, PA-C  cloNIDine (CATAPRES) 0.1 MG tablet Take 1 tablet (0.1 mg total) by mouth 2 (two) times daily. Patient taking differently: Take 0.1 mg by mouth at bedtime. 02/04/23   Almon Hercules, MD  desvenlafaxine (PRISTIQ) 50 MG 24 hr tablet Take 50 mg by mouth daily. 03/05/23   [provider]  finasteride (PROSCAR) 5 MG tablet Take 5 mg by mouth daily. Patient not taking: Reported on 04/12/2023    [provider]  folic acid (FOLVITE) 1 MG tablet Take 1 tablet (1 mg total) by mouth daily. 03/01/23   Osvaldo Shipper, MD  gabapentin (NEURONTIN) 300 MG capsule Take 300 mg by mouth 2 (two) times daily.    [provider]  hydrOXYzine (ATARAX) 50 MG tablet Take 50 mg by mouth daily.    [provider]  magnesium hydroxide (MILK OF MAGNESIA) 400 MG/5ML suspension Take 30 mLs by mouth every other day.    [provider]  metoprolol tartrate (LOPRESSOR) 50 MG tablet Take 50 mg by mouth 2 (two) times daily.    [provider]  oxybutynin (DITROPAN) 5 MG tablet Take 1 tablet (5 mg total) by mouth 2 (two) times daily as needed for bladder spasms. 03/27/23   Jerilee Field, MD  QUEtiapine (SEROQUEL) 300 MG tablet Take 300 mg by mouth at bedtime. 12/02/22   [provider]  rosuvastatin (CRESTOR) 20 MG tablet Take 20 mg by mouth every Monday, Wednesday, and Friday. 08/23/21   [provider]  tamsulosin (FLOMAX) 0.4 MG CAPS capsule Take 0.4 mg by mouth at bedtime. Patient not taking: Reported on 04/12/2023    [provider]  thiamine (VITAMIN B-1) 100 MG tablet Take 1 tablet (100 mg total) by mouth daily. 04/13/23   Osvaldo Shipper, MD      Allergies    Patient has no known allergies.    Review of Systems   Review of Systems  Respiratory:  Positive for cough.     Physical Exam Updated Vital Signs BP (!) 141/79   Pulse 95   Temp (!) 97.5 F (36.4 C) (Oral)   Resp 15   Ht 5\' 11"  (1.803 m)   Wt 97 kg   SpO2 97%  BMI 29.83 kg/m  Physical Exam Vitals and nursing note reviewed.  Constitutional:      General: He is not in acute distress.    Appearance: He is well-developed.  HENT:     Head: Normocephalic and atraumatic.  Eyes:     Conjunctiva/sclera: Conjunctivae normal.  Cardiovascular:     Rate and Rhythm: Normal rate and regular rhythm.     Heart sounds: No murmur heard. Pulmonary:     Effort: Pulmonary effort is normal. No respiratory distress.     Breath sounds: Wheezing present.     Comments: Wheezes improved significantly after DuoNeb Abdominal:     Palpations: Abdomen is soft.     Tenderness: There is no abdominal tenderness.  Musculoskeletal:        General: No swelling.     Cervical back: Neck supple.   Skin:    General: Skin is warm and dry.     Capillary Refill: Capillary refill takes less than 2 seconds.  Neurological:     General: No focal deficit present.     Mental Status: He is alert.  Psychiatric:        Mood and Affect: Mood normal.     ED Results / Procedures / Treatments   Labs (all labs ordered are listed, but only abnormal results are displayed) Labs Reviewed  COMPREHENSIVE METABOLIC PANEL - Abnormal; Notable for the following components:      Result Value   CO2 17 (*)    Glucose, Bld 114 (*)    Creatinine, Ser 1.31 (*)    Calcium 8.3 (*)    Total Bilirubin 1.3 (*)    GFR, Estimated 58 (*)    Anion gap 18 (*)    All other components within normal limits  ETHANOL - Abnormal; Notable for the following components:   Alcohol, Ethyl (B) 218 (*)    All other components within normal limits  RESP PANEL BY RT-PCR (RSV, FLU A&B, COVID)  RVPGX2  CBC  MAGNESIUM  RAPID URINE DRUG SCREEN, HOSP PERFORMED  URINALYSIS, ROUTINE W REFLEX MICROSCOPIC    EKG None  Radiology DG Chest 2 View Result Date: 05/27/2023 CLINICAL DATA:  Shortness of breath EXAM: CHEST - 2 VIEW COMPARISON:  04/11/2023 FINDINGS: Cardiac shadow is enlarged but stable. Poor inspiratory effort is noted with mild left basilar atelectasis. No effusion is seen. No bony abnormality is noted. IMPRESSION: Mild left basilar atelectasis. Electronically Signed   By: Alcide Clever M.D.   On: 05/27/2023 23:41    Procedures Procedures    Medications Ordered in ED Medications  albuterol (VENTOLIN HFA) 108 (90 Base) MCG/ACT inhaler 2 puff (has no administration in time range)  ipratropium-albuterol (DUONEB) 0.5-2.5 (3) MG/3ML nebulizer solution 3 mL (3 mLs Nebulization Given 05/28/23 0016)  sodium chloride 0.9 % bolus 1,000 mL (0 mLs Intravenous Stopped 05/28/23 0321)  ipratropium-albuterol (DUONEB) 0.5-2.5 (3) MG/3ML nebulizer solution 3 mL (3 mLs Nebulization Given 05/28/23 0351)  LORazepam (ATIVAN) tablet 1 mg  (1 mg Oral Given 05/28/23 0522)    ED Course/ Medical Decision Making/ A&P                                 Medical Decision Making Amount and/or Complexity of Data Reviewed Radiology: ordered.  Risk Prescription drug management.   This patient presents to the ED for concern of coughing, this involves an extensive number of treatment options, and is a complaint that carries  with it a high risk of complications and morbidity.  The differential diagnosis includes viral infection, pneumonia, choking, others.  Patient also endorses alcohol consumption   Co morbidities that complicate the patient evaluation  Alcohol intoxication   Additional history obtained:  Additional history obtained from EMS External records from outside source obtained and reviewed including internal medicine notes   Lab Tests:  I Ordered, and personally interpreted labs.  The pertinent results include: Ethanol 218; mildly elevated creatinine 1.31, small anion gap likely due to alcohol at 18   Imaging Studies ordered:  I ordered imaging studies including chest x-ray I independently visualized and interpreted imaging which showed mild left-sided basilar atelectasis  I agree with the radiologist interpretation   Cardiac Monitoring: / EKG:  The patient was maintained on a cardiac monitor.  I personally viewed and interpreted the cardiac monitored which showed an underlying rhythm of: Sinus tachycardia on EKG (sinus rhythm with a rate of 95 on most recent bedside read)    Problem List / ED Course / Critical interventions / Medication management   I ordered medication including DuoNeb for wheezing, saline bolus for mild AKI, Ativan for mild tremors Reevaluation of the patient after these medicines showed that the patient improved I have reviewed the patients home medicines and have made adjustments as needed   Social Determinants of Health:  Patient with history of alcohol abuse   Test /  Admission - Considered:  Patient presents to the emergency room clinically intoxicated with a cough and mild wheezing.  Wheezing improved significantly with DuoNeb treatments.  No pneumonia on chest x-ray, no leukocytosis on lab workup.  Respiratory panel was negative.  Patient has been ambulated in the hallway without difficulty.  Patient passed p.o. challenge.  Patient with no signs of acute withdrawal at this time.  Patient feels ready to discharge home.  Clinically he appears sober.  Discharged home with return precautions and resources for substance abuse treatment.  Patient does not voice willingness to participate in alcohol cessation at this time.  Will not prescribe Librium.          Final Clinical Impression(s) / ED Diagnoses Final diagnoses:  Acute cough  Wheezes  Alcoholic intoxication without complication [facility])    Rx / DC Orders ED Discharge Orders     None         Pamala Duffel 05/28/23 0556    Glendora Score, MD 05/28/23 2111

## 2023-06-01 DIAGNOSIS — J90 Pleural effusion, not elsewhere classified: Secondary | ICD-10-CM | POA: Diagnosis not present

## 2023-06-01 DIAGNOSIS — R000 Tachycardia, unspecified: Secondary | ICD-10-CM | POA: Diagnosis not present

## 2023-06-01 DIAGNOSIS — J189 Pneumonia, unspecified organism: Secondary | ICD-10-CM | POA: Diagnosis not present

## 2023-06-01 DIAGNOSIS — R6889 Other general symptoms and signs: Secondary | ICD-10-CM | POA: Diagnosis not present

## 2023-06-01 DIAGNOSIS — Z743 Need for continuous supervision: Secondary | ICD-10-CM | POA: Diagnosis not present

## 2023-06-01 DIAGNOSIS — E872 Acidosis, unspecified: Secondary | ICD-10-CM | POA: Diagnosis not present

## 2023-06-01 DIAGNOSIS — F10139 Alcohol abuse with withdrawal, unspecified: Secondary | ICD-10-CM | POA: Diagnosis not present

## 2023-06-02 DIAGNOSIS — F10239 Alcohol dependence with withdrawal, unspecified: Secondary | ICD-10-CM | POA: Diagnosis not present

## 2023-06-02 DIAGNOSIS — I10 Essential (primary) hypertension: Secondary | ICD-10-CM | POA: Diagnosis not present

## 2023-06-02 DIAGNOSIS — K5900 Constipation, unspecified: Secondary | ICD-10-CM | POA: Diagnosis not present

## 2023-06-02 DIAGNOSIS — F10229 Alcohol dependence with intoxication, unspecified: Secondary | ICD-10-CM | POA: Diagnosis not present

## 2023-06-02 DIAGNOSIS — E872 Acidosis, unspecified: Secondary | ICD-10-CM | POA: Diagnosis not present

## 2023-06-02 DIAGNOSIS — J90 Pleural effusion, not elsewhere classified: Secondary | ICD-10-CM | POA: Diagnosis not present

## 2023-06-02 DIAGNOSIS — F17290 Nicotine dependence, other tobacco product, uncomplicated: Secondary | ICD-10-CM | POA: Diagnosis not present

## 2023-06-02 DIAGNOSIS — K760 Fatty (change of) liver, not elsewhere classified: Secondary | ICD-10-CM | POA: Diagnosis not present

## 2023-06-02 DIAGNOSIS — R339 Retention of urine, unspecified: Secondary | ICD-10-CM | POA: Diagnosis not present

## 2023-06-02 DIAGNOSIS — D49512 Neoplasm of unspecified behavior of left kidney: Secondary | ICD-10-CM | POA: Diagnosis not present

## 2023-06-02 DIAGNOSIS — N390 Urinary tract infection, site not specified: Secondary | ICD-10-CM | POA: Diagnosis not present

## 2023-06-02 DIAGNOSIS — J189 Pneumonia, unspecified organism: Secondary | ICD-10-CM | POA: Diagnosis not present

## 2023-06-02 DIAGNOSIS — F10139 Alcohol abuse with withdrawal, unspecified: Secondary | ICD-10-CM | POA: Diagnosis not present

## 2023-06-02 DIAGNOSIS — J918 Pleural effusion in other conditions classified elsewhere: Secondary | ICD-10-CM | POA: Diagnosis not present

## 2023-06-02 DIAGNOSIS — N401 Enlarged prostate with lower urinary tract symptoms: Secondary | ICD-10-CM | POA: Diagnosis not present

## 2023-06-02 DIAGNOSIS — R000 Tachycardia, unspecified: Secondary | ICD-10-CM | POA: Diagnosis not present

## 2023-06-03 DIAGNOSIS — F10139 Alcohol abuse with withdrawal, unspecified: Secondary | ICD-10-CM | POA: Diagnosis not present

## 2023-06-03 DIAGNOSIS — I10 Essential (primary) hypertension: Secondary | ICD-10-CM | POA: Diagnosis not present

## 2023-06-03 DIAGNOSIS — J189 Pneumonia, unspecified organism: Secondary | ICD-10-CM | POA: Diagnosis not present

## 2023-06-04 DIAGNOSIS — R339 Retention of urine, unspecified: Secondary | ICD-10-CM | POA: Diagnosis not present

## 2023-06-04 DIAGNOSIS — J189 Pneumonia, unspecified organism: Secondary | ICD-10-CM | POA: Diagnosis not present

## 2023-06-04 DIAGNOSIS — F10139 Alcohol abuse with withdrawal, unspecified: Secondary | ICD-10-CM | POA: Diagnosis not present

## 2023-06-04 DIAGNOSIS — I10 Essential (primary) hypertension: Secondary | ICD-10-CM | POA: Diagnosis not present

## 2023-06-05 DIAGNOSIS — R339 Retention of urine, unspecified: Secondary | ICD-10-CM | POA: Diagnosis not present

## 2023-06-05 DIAGNOSIS — J189 Pneumonia, unspecified organism: Secondary | ICD-10-CM | POA: Diagnosis not present

## 2023-06-05 DIAGNOSIS — F10139 Alcohol abuse with withdrawal, unspecified: Secondary | ICD-10-CM | POA: Diagnosis not present

## 2023-06-05 DIAGNOSIS — I10 Essential (primary) hypertension: Secondary | ICD-10-CM | POA: Diagnosis not present

## 2023-06-05 DIAGNOSIS — D49512 Neoplasm of unspecified behavior of left kidney: Secondary | ICD-10-CM | POA: Diagnosis not present

## 2023-06-06 DIAGNOSIS — F10139 Alcohol abuse with withdrawal, unspecified: Secondary | ICD-10-CM | POA: Diagnosis not present

## 2023-06-06 DIAGNOSIS — I10 Essential (primary) hypertension: Secondary | ICD-10-CM | POA: Diagnosis not present

## 2023-06-06 DIAGNOSIS — J189 Pneumonia, unspecified organism: Secondary | ICD-10-CM | POA: Diagnosis not present

## 2023-06-06 DIAGNOSIS — R339 Retention of urine, unspecified: Secondary | ICD-10-CM | POA: Diagnosis not present

## 2023-06-07 DIAGNOSIS — I10 Essential (primary) hypertension: Secondary | ICD-10-CM | POA: Diagnosis not present

## 2023-06-07 DIAGNOSIS — F10139 Alcohol abuse with withdrawal, unspecified: Secondary | ICD-10-CM | POA: Diagnosis not present

## 2023-06-07 DIAGNOSIS — R339 Retention of urine, unspecified: Secondary | ICD-10-CM | POA: Diagnosis not present

## 2023-06-07 DIAGNOSIS — J189 Pneumonia, unspecified organism: Secondary | ICD-10-CM | POA: Diagnosis not present

## 2023-06-08 DIAGNOSIS — I10 Essential (primary) hypertension: Secondary | ICD-10-CM | POA: Diagnosis not present

## 2023-06-08 DIAGNOSIS — F10139 Alcohol abuse with withdrawal, unspecified: Secondary | ICD-10-CM | POA: Diagnosis not present

## 2023-06-08 DIAGNOSIS — R339 Retention of urine, unspecified: Secondary | ICD-10-CM | POA: Diagnosis not present

## 2023-06-08 DIAGNOSIS — J189 Pneumonia, unspecified organism: Secondary | ICD-10-CM | POA: Diagnosis not present

## 2023-06-09 DIAGNOSIS — R339 Retention of urine, unspecified: Secondary | ICD-10-CM | POA: Diagnosis not present

## 2023-06-09 DIAGNOSIS — F10139 Alcohol abuse with withdrawal, unspecified: Secondary | ICD-10-CM | POA: Diagnosis not present

## 2023-06-09 DIAGNOSIS — J189 Pneumonia, unspecified organism: Secondary | ICD-10-CM | POA: Diagnosis not present

## 2023-06-09 DIAGNOSIS — I10 Essential (primary) hypertension: Secondary | ICD-10-CM | POA: Diagnosis not present

## 2023-06-09 DIAGNOSIS — K5900 Constipation, unspecified: Secondary | ICD-10-CM | POA: Diagnosis not present

## 2023-06-10 DIAGNOSIS — I10 Essential (primary) hypertension: Secondary | ICD-10-CM | POA: Diagnosis not present

## 2023-06-10 DIAGNOSIS — J189 Pneumonia, unspecified organism: Secondary | ICD-10-CM | POA: Diagnosis not present

## 2023-06-10 DIAGNOSIS — R339 Retention of urine, unspecified: Secondary | ICD-10-CM | POA: Diagnosis not present

## 2023-06-10 DIAGNOSIS — K5900 Constipation, unspecified: Secondary | ICD-10-CM | POA: Diagnosis not present

## 2023-06-10 DIAGNOSIS — F10139 Alcohol abuse with withdrawal, unspecified: Secondary | ICD-10-CM | POA: Diagnosis not present

## 2023-06-11 DIAGNOSIS — I10 Essential (primary) hypertension: Secondary | ICD-10-CM | POA: Diagnosis not present

## 2023-06-11 DIAGNOSIS — K5900 Constipation, unspecified: Secondary | ICD-10-CM | POA: Diagnosis not present

## 2023-06-11 DIAGNOSIS — J189 Pneumonia, unspecified organism: Secondary | ICD-10-CM | POA: Diagnosis not present

## 2023-06-11 DIAGNOSIS — F10139 Alcohol abuse with withdrawal, unspecified: Secondary | ICD-10-CM | POA: Diagnosis not present

## 2023-06-11 DIAGNOSIS — R339 Retention of urine, unspecified: Secondary | ICD-10-CM | POA: Diagnosis not present

## 2023-06-12 DIAGNOSIS — R339 Retention of urine, unspecified: Secondary | ICD-10-CM | POA: Diagnosis not present

## 2023-06-12 DIAGNOSIS — I10 Essential (primary) hypertension: Secondary | ICD-10-CM | POA: Diagnosis not present

## 2023-06-12 DIAGNOSIS — F10139 Alcohol abuse with withdrawal, unspecified: Secondary | ICD-10-CM | POA: Diagnosis not present

## 2023-06-12 DIAGNOSIS — J189 Pneumonia, unspecified organism: Secondary | ICD-10-CM | POA: Diagnosis not present

## 2023-06-12 DIAGNOSIS — K5900 Constipation, unspecified: Secondary | ICD-10-CM | POA: Diagnosis not present

## 2023-06-13 DIAGNOSIS — F10139 Alcohol abuse with withdrawal, unspecified: Secondary | ICD-10-CM | POA: Diagnosis not present

## 2023-06-13 DIAGNOSIS — I10 Essential (primary) hypertension: Secondary | ICD-10-CM | POA: Diagnosis not present

## 2023-06-13 DIAGNOSIS — J189 Pneumonia, unspecified organism: Secondary | ICD-10-CM | POA: Diagnosis not present

## 2023-06-13 DIAGNOSIS — K5900 Constipation, unspecified: Secondary | ICD-10-CM | POA: Diagnosis not present

## 2023-06-13 DIAGNOSIS — R339 Retention of urine, unspecified: Secondary | ICD-10-CM | POA: Diagnosis not present

## 2023-06-14 ENCOUNTER — Emergency Department (HOSPITAL_COMMUNITY): Admission: EM | Admit: 2023-06-14 | Discharge: 2023-06-14 | Disposition: A | Discharge status: Home / Self Care

## 2023-06-14 DIAGNOSIS — Y907 Blood alcohol level of 200-239 mg/100 ml: Secondary | ICD-10-CM | POA: Diagnosis not present

## 2023-06-14 DIAGNOSIS — F10129 Alcohol abuse with intoxication, unspecified: Secondary | ICD-10-CM | POA: Diagnosis not present

## 2023-06-14 DIAGNOSIS — F10929 Alcohol use, unspecified with intoxication, unspecified: Secondary | ICD-10-CM | POA: Diagnosis not present

## 2023-06-14 DIAGNOSIS — R000 Tachycardia, unspecified: Secondary | ICD-10-CM | POA: Diagnosis not present

## 2023-06-14 DIAGNOSIS — R456 Violent behavior: Secondary | ICD-10-CM | POA: Diagnosis present

## 2023-06-14 DIAGNOSIS — I959 Hypotension, unspecified: Secondary | ICD-10-CM | POA: Diagnosis not present

## 2023-06-14 DIAGNOSIS — I10 Essential (primary) hypertension: Secondary | ICD-10-CM | POA: Diagnosis not present

## 2023-06-14 DIAGNOSIS — R001 Bradycardia, unspecified: Secondary | ICD-10-CM | POA: Diagnosis not present

## 2023-06-14 DIAGNOSIS — F10920 Alcohol use, unspecified with intoxication, uncomplicated: Secondary | ICD-10-CM | POA: Insufficient documentation

## 2023-06-14 LAB — COMPREHENSIVE METABOLIC PANEL
ALT: 33 U/L (ref 0–44)
AST: 26 U/L (ref 15–41)
Albumin: 3.6 g/dL (ref 3.5–5.0)
Alkaline Phosphatase: 66 U/L (ref 38–126)
Anion gap: 14 (ref 5–15)
BUN: 12 mg/dL (ref 8–23)
CO2: 17 mmol/L — ABNORMAL LOW (ref 22–32)
Calcium: 8.5 mg/dL — ABNORMAL LOW (ref 8.9–10.3)
Chloride: 110 mmol/L (ref 98–111)
Creatinine, Ser: 1.08 mg/dL (ref 0.61–1.24)
GFR, Estimated: >60 mL/min (ref >60)
Glucose, Bld: 105 mg/dL — ABNORMAL HIGH (ref 70–99)
Potassium: 3.9 mmol/L (ref 3.5–5.1)
Sodium: 141 mmol/L (ref 135–145)
Total Bilirubin: 0.4 mg/dL (ref 0.0–1.2)
Total Protein: 7.1 g/dL (ref 6.5–8.1)

## 2023-06-14 LAB — CBC WITH DIFFERENTIAL/PLATELET
Abs Immature Granulocytes: 0.01 10*3/uL (ref 0.00–0.07)
Basophils Absolute: 0.1 10*3/uL (ref 0.0–0.1)
Basophils Relative: 1 %
Eosinophils Absolute: 0.2 10*3/uL (ref 0.0–0.5)
Eosinophils Relative: 4 %
HCT: 46.3 % (ref 39.0–52.0)
Hemoglobin: 15 g/dL (ref 13.0–17.0)
Immature Granulocytes: 0 %
Lymphocytes Relative: 37 %
Lymphs Abs: 1.8 10*3/uL (ref 0.7–4.0)
MCH: 29.4 pg (ref 26.0–34.0)
MCHC: 32.4 g/dL (ref 30.0–36.0)
MCV: 90.6 fL (ref 80.0–100.0)
Monocytes Absolute: 0.7 10*3/uL (ref 0.1–1.0)
Monocytes Relative: 14 %
Neutro Abs: 2.2 10*3/uL (ref 1.7–7.7)
Neutrophils Relative %: 44 %
Platelets: 204 10*3/uL (ref 150–400)
RBC: 5.11 MIL/uL (ref 4.22–5.81)
RDW: 13.4 % (ref 11.5–15.5)
WBC: 5 10*3/uL (ref 4.0–10.5)
nRBC: 0 % (ref 0.0–0.2)

## 2023-06-14 LAB — MAGNESIUM: Magnesium: 2 mg/dL (ref 1.7–2.4)

## 2023-06-14 LAB — ETHANOL: Alcohol, Ethyl (B): 219 mg/dL — ABNORMAL HIGH (ref <10)

## 2023-06-14 NOTE — ED Provider Notes (Addendum)
 [place] EMERGENCY DEPARTMENT AT [facility] Provider Note   CSN: [id number] Arrival date & time: 06/14/23  1133     History  Chief Complaint  Patient presents with   Alcohol Intoxication    Ruben [name] is a 73 y.o. male.   Alcohol Intoxication  Patient is a 73 year old male who presents to the ED today complaining of alcohol intoxication.  Was aggressive with staff upon arrival to the ED and was placed in restraints.  Previous medical history of alcohol intoxication/withdrawal/DT, GERD, AKI, metabolic acidosis, MDD, SI.  Reports having drink vodka which she says he normally has this daily.  Last drink was earlier this a.m.  Denies nausea, vomiting, A/V hallucinations, fall/injury.     Home Medications Prior to Admission medications   Medication Sig Start Date End Date Taking? Authorizing Provider  cephALEXin (KEFLEX) 500 MG capsule Take 1 capsule (500 mg total) by mouth at bedtime. 03/27/23   Jerilee Field, MD  chlordiazePOXIDE (LIBRIUM) 25 MG capsule 50mg  PO TID x 1D, then 25-50mg  PO BID X 1D, then 25-50mg  PO QD X 1D 04/26/23   Barrie Dunker B, PA-C  cloNIDine (CATAPRES) 0.1 MG tablet Take 1 tablet (0.1 mg total) by mouth 2 (two) times daily. Patient taking differently: Take 0.1 mg by mouth at bedtime. 02/04/23   Almon Hercules, MD  desvenlafaxine (PRISTIQ) 50 MG 24 hr tablet Take 50 mg by mouth daily. 03/05/23   [provider]  finasteride (PROSCAR) 5 MG tablet Take 5 mg by mouth daily. Patient not taking: Reported on 04/12/2023    [provider]  folic acid (FOLVITE) 1 MG tablet Take 1 tablet (1 mg total) by mouth daily. 03/01/23   Osvaldo Shipper, MD  gabapentin (NEURONTIN) 300 MG capsule Take 300 mg by mouth 2 (two) times daily.    [provider]  hydrOXYzine (ATARAX) 50 MG tablet Take 50 mg by mouth daily.    [provider]  magnesium hydroxide (MILK OF MAGNESIA) 400 MG/5ML suspension Take 30 mLs by mouth every  other day.    [provider]  metoprolol tartrate (LOPRESSOR) 50 MG tablet Take 50 mg by mouth 2 (two) times daily.    [provider]  oxybutynin (DITROPAN) 5 MG tablet Take 1 tablet (5 mg total) by mouth 2 (two) times daily as needed for bladder spasms. 03/27/23   Jerilee Field, MD  QUEtiapine (SEROQUEL) 300 MG tablet Take 300 mg by mouth at bedtime. 12/02/22   [provider]  rosuvastatin (CRESTOR) 20 MG tablet Take 20 mg by mouth every Monday, Wednesday, and Friday. 08/23/21   [provider]  tamsulosin (FLOMAX) 0.4 MG CAPS capsule Take 0.4 mg by mouth at bedtime. Patient not taking: Reported on 04/12/2023    [provider]  thiamine (VITAMIN B-1) 100 MG tablet Take 1 tablet (100 mg total) by mouth daily. 04/13/23   Osvaldo Shipper, MD      Allergies    Patient has no known allergies.    Review of Systems   Review of Systems  Psychiatric/Behavioral:  Positive for agitation.   All other systems reviewed and are negative.   Physical Exam Updated Vital Signs BP (!) 141/90   Pulse 97   Temp 98.2 F (36.8 C) (Oral)   Resp 18   SpO2 99%  Physical Exam Vitals and nursing note reviewed.  Constitutional:      General: He is not in acute distress.    Appearance: Normal appearance. He  is not ill-appearing.     Comments: Breath smells notably of alcohol.  HENT:     Head: Normocephalic and atraumatic.     Mouth/Throat:     Mouth: Mucous membranes are moist.     Pharynx: Oropharynx is clear. No oropharyngeal exudate or posterior oropharyngeal erythema.  Eyes:     General: No scleral icterus.       Right eye: No discharge.        Left eye: No discharge.     Extraocular Movements: Extraocular movements intact.     Conjunctiva/sclera: Conjunctivae normal.  Cardiovascular:     Rate and Rhythm: Normal rate and regular rhythm.     Pulses: Normal pulses.     Heart sounds: Normal heart sounds. No murmur heard.    No friction rub. No gallop.   Pulmonary:     Effort: Pulmonary effort is normal. No respiratory distress.     Breath sounds: Normal breath sounds. No stridor. No wheezing, rhonchi or rales.  Abdominal:     General: Abdomen is flat. There is no distension.     Palpations: Abdomen is soft.     Tenderness: There is no abdominal tenderness. There is no right CVA tenderness, left CVA tenderness or guarding.  Musculoskeletal:        General: No tenderness. Normal range of motion.     Cervical back: Normal range of motion and neck supple. No rigidity.     Right lower leg: No edema.     Left lower leg: No edema.  Lymphadenopathy:     Cervical: No cervical adenopathy.  Skin:    General: Skin is warm and dry.     Coloration: Skin is not jaundiced or pale.     Findings: No bruising or erythema.  Neurological:     General: No focal deficit present.     Mental Status: He is alert and oriented to person, place, and time. Mental status is at baseline.     Sensory: No sensory deficit.     Motor: No weakness.     Coordination: Coordination normal.     Gait: Gait normal.     Comments: No tongue fasciculations noted.  Psychiatric:        Mood and Affect: Mood normal.        Behavior: Behavior normal.        Thought Content: Thought content normal.        Judgment: Judgment normal.     ED Results / Procedures / Treatments   Labs (all labs ordered are listed, but only abnormal results are displayed) Labs Reviewed  COMPREHENSIVE METABOLIC PANEL - Abnormal; Notable for the following components:      Result Value   CO2 17 (*)    Glucose, Bld 105 (*)    Calcium 8.5 (*)    All other components within normal limits  ETHANOL - Abnormal; Notable for the following components:   Alcohol, Ethyl (B) 219 (*)    All other components within normal limits  CBC WITH DIFFERENTIAL/PLATELET  MAGNESIUM    EKG EKG Interpretation Date/Time:  Thursday June 14 2023 13:12:03 EST Ventricular Rate:  105 PR Interval:  170 QRS  Duration:  78 QT Interval:  336 QTC Calculation: 444 R Axis:   1  Text Interpretation: Sinus tachycardia Otherwise normal ECG When compared with ECG of 27-May-2023 23:08, PREVIOUS ECG IS PRESENT Confirmed by Estanislado Pandy [phone]) on 06/14/2023 2:40:54 PM  Radiology No results found.  Procedures Procedures   Medications  Ordered in ED Medications - No data to display  ED Course/ Medical Decision Making/ A&P                                 Medical Decision Making Amount and/or Complexity of Data Reviewed Labs: ordered. ECG/medicine tests: ordered.   Patient is a 73 year old male who presents to the ED today complaining of alcohol intoxication.  Was aggressive with staff upon arrival to the ED and was placed in restraints.  Previous medical history of alcohol intoxication/withdrawal/DT, GERD, AKI, metabolic acidosis, MDD, SI.  Reports having drink vodka which she says he normally has this daily.  Last drink was earlier this a.m.  Denies nausea, vomiting, A/V hallucinations.  He reports having recently come out of a alcohol detox facility last week.  And has not drink within the last 10 days with this being his first alcoholic drink and that.  States that he has a Foley catheter in place.  Also reports that he had not previously taken the Librium that was given to him from previous visit.  On physical exam, patient is noted to be in no acute distress, afebrile, in restraints.  He is polite and answering questions appropriately.  Noted to be tachycardic on exam. LCTAB, no abdominal tenderness/distention noted.  Normal sensation bilaterally.  Alert and oriented x 4.  No tongue fasciculations noted.  Foley catheter in place per Foley bag is overflowed and has wet his pants.  Restraints were then removed at that time.  On reevaluation patient was noted to go along to be tachycardic, continue to answer questions appropriately.  States that he wishes to go home as he does not wish to undergo  any further treatments at this time.  Do not believe that the patient is in alcohol withdrawal as his current alcohol level was 219 upon 13:00 when labs were drawn.  Also have low suspicion for injury as patient was noted to have been conscious throughout the whole time but had been seen to be something in the chair due to alcohol withdrawal while at the [employer].  Low suspicion for head injury and no sign of injury during exam.  Considered CT imaging however do not believe that is necessary at this time.  He was able to ambulate without difficulty, continuing not to have any sort of tongue fasciculations as well as instability.  Patient vitals have become normal with no tachycardia, no tachypnea present.  Provided behavioral urgent care information as well as recommend that he follow-up with PCP for further care.  Provided strict return to ED precautions for withdrawal.  Patient wished to leave and did not wish to undergo any further treatment at this time.  I believe patient safe to be discharged at this time.  Case was discussed with attending who agreed with plan.  Differential diagnoses prior to evaluation: The emergent differential diagnosis includes, but is not limited to, DT, alcohol withdrawal, UTI, CVA,. This is not an exhaustive differential.   Past Medical History / Co-morbidities / Social History: HTN, GERD, alcoholic intoxication/withdrawal, metabolic acidosis, suicidal ideation, AKI, DT, MDD.  Additional history: Chart reviewed. Pertinent results include:   Last seen for alcohol intoxication on 04/25/2023.  Was given a Librium taper for wishing to come off alcohol at that time.  Lab Tests/Imaging studies: I personally interpreted labs/imaging and the pertinent results include:   CBC unremarkable Magnesium unremarkable Ethanol was 219 CMP noted a  decreased bicarb 17 with a mild hypocalcemia of 8.5  Cardiac monitoring: EKG obtained and interpreted by myself and attending  physician which shows: Sinus tachycardia  EKG Interpretation Date/Time:  Thursday June 14 2023 13:12:03 EST Ventricular Rate:  105 PR Interval:  170 QRS Duration:  78 QT Interval:  336 QTC Calculation: 444 R Axis:   1  Text Interpretation: Sinus tachycardia Otherwise normal ECG When compared with ECG of 27-May-2023 23:08, PREVIOUS ECG IS PRESENT Confirmed by Estanislado Pandy [phone]) on 06/14/2023 2:40:54 PM          Medications: No medications required for this visit.  I have reviewed the patients home medicines and have made adjustments as needed.   Disposition: After consideration of the diagnostic results and the patients response to treatment, I feel that the patient benefit from discharge and treatment as above.   emergency department workup does not suggest an emergent condition requiring admission or immediate intervention beyond what has been performed at this time. The plan is: Monitor symptoms for withdrawal, return for any new or worsening symptoms, follow-up with PCP. The patient is safe for discharge and has been instructed to return immediately for worsening symptoms, change in symptoms or any other concerns.  Final Clinical Impression(s) / ED Diagnoses Final diagnoses:  Alcoholic intoxication without complication [facility])    Rx / DC Orders ED Discharge Orders     None         Lunette Stands, PA-C 06/14/23 1510    Lunette Stands, [place] 06/14/23 1513    Coral Spikes, DO 06/14/23 1600

## 2023-06-14 NOTE — Discharge Instructions (Addendum)
 You were seen today for alcohol intoxication.  Recommend that you continue to follow-up with the behavioral health urgent care for further consultation on how to cease alcohol.  If you begin to develop having any sort of nausea, vomiting, increasing tremors, tongue starting to tremor, hallucinate, return to the ED immediately for possible withdrawal.  Recommend the also follow-up with your PCP for further evaluation.  As well as a better resource for quitting alcohol.

## 2023-06-14 NOTE — ED Triage Notes (Signed)
 Pt  [facility] where he slumped over in the chair a couple of times , pt smells of etoh with known hx of same , upon arrival to the ED pt began auguring and fight staff

## 2023-06-15 ENCOUNTER — Encounter (HOSPITAL_COMMUNITY): Payer: Self-pay

## 2023-06-15 ENCOUNTER — Emergency Department (HOSPITAL_COMMUNITY)
Admission: EM | Admit: 2023-06-15 | Discharge: 2023-06-15 | Disposition: A | Discharge status: Home / Self Care | Attending: Emergency Medicine | Admitting: Emergency Medicine

## 2023-06-15 ENCOUNTER — Other Ambulatory Visit: Payer: Self-pay

## 2023-06-15 DIAGNOSIS — R000 Tachycardia, unspecified: Secondary | ICD-10-CM | POA: Diagnosis not present

## 2023-06-15 DIAGNOSIS — F10221 Alcohol dependence with intoxication delirium: Secondary | ICD-10-CM | POA: Diagnosis not present

## 2023-06-15 DIAGNOSIS — E872 Acidosis, unspecified: Secondary | ICD-10-CM | POA: Diagnosis not present

## 2023-06-15 DIAGNOSIS — F10929 Alcohol use, unspecified with intoxication, unspecified: Secondary | ICD-10-CM | POA: Diagnosis not present

## 2023-06-15 DIAGNOSIS — Z79899 Other long term (current) drug therapy: Secondary | ICD-10-CM | POA: Insufficient documentation

## 2023-06-15 DIAGNOSIS — F10920 Alcohol use, unspecified with intoxication, uncomplicated: Secondary | ICD-10-CM

## 2023-06-15 DIAGNOSIS — F10232 Alcohol dependence with withdrawal with perceptual disturbance: Secondary | ICD-10-CM | POA: Diagnosis not present

## 2023-06-15 DIAGNOSIS — E785 Hyperlipidemia, unspecified: Secondary | ICD-10-CM | POA: Diagnosis not present

## 2023-06-15 DIAGNOSIS — F10129 Alcohol abuse with intoxication, unspecified: Secondary | ICD-10-CM | POA: Diagnosis not present

## 2023-06-15 DIAGNOSIS — F10120 Alcohol abuse with intoxication, uncomplicated: Secondary | ICD-10-CM | POA: Insufficient documentation

## 2023-06-15 DIAGNOSIS — F10931 Alcohol use, unspecified with withdrawal delirium: Secondary | ICD-10-CM | POA: Diagnosis not present

## 2023-06-15 DIAGNOSIS — I10 Essential (primary) hypertension: Secondary | ICD-10-CM | POA: Diagnosis not present

## 2023-06-15 DIAGNOSIS — Y908 Blood alcohol level of 240 mg/100 ml or more: Secondary | ICD-10-CM | POA: Diagnosis not present

## 2023-06-15 DIAGNOSIS — N401 Enlarged prostate with lower urinary tract symptoms: Secondary | ICD-10-CM | POA: Diagnosis not present

## 2023-06-15 DIAGNOSIS — Z87820 Personal history of traumatic brain injury: Secondary | ICD-10-CM | POA: Diagnosis not present

## 2023-06-15 DIAGNOSIS — F1020 Alcohol dependence, uncomplicated: Secondary | ICD-10-CM | POA: Diagnosis not present

## 2023-06-15 DIAGNOSIS — F10231 Alcohol dependence with withdrawal delirium: Secondary | ICD-10-CM | POA: Diagnosis not present

## 2023-06-15 DIAGNOSIS — F331 Major depressive disorder, recurrent, moderate: Secondary | ICD-10-CM | POA: Diagnosis not present

## 2023-06-15 LAB — COMPREHENSIVE METABOLIC PANEL
ALT: 33 U/L (ref 0–44)
AST: 31 U/L (ref 15–41)
Albumin: 3.5 g/dL (ref 3.5–5.0)
Alkaline Phosphatase: 72 U/L (ref 38–126)
Anion gap: 17 — ABNORMAL HIGH (ref 5–15)
BUN: 10 mg/dL (ref 8–23)
CO2: 20 mmol/L — ABNORMAL LOW (ref 22–32)
Calcium: 8.4 mg/dL — ABNORMAL LOW (ref 8.9–10.3)
Chloride: 106 mmol/L (ref 98–111)
Creatinine, Ser: 1.06 mg/dL (ref 0.61–1.24)
GFR, Estimated: >60 mL/min (ref >60)
Glucose, Bld: 98 mg/dL (ref 70–99)
Potassium: 4 mmol/L (ref 3.5–5.1)
Sodium: 143 mmol/L (ref 135–145)
Total Bilirubin: 0.5 mg/dL (ref 0.0–1.2)
Total Protein: 7.2 g/dL (ref 6.5–8.1)

## 2023-06-15 LAB — CBC WITH DIFFERENTIAL/PLATELET
Abs Immature Granulocytes: 0.01 10*3/uL (ref 0.00–0.07)
Basophils Absolute: 0.1 10*3/uL (ref 0.0–0.1)
Basophils Relative: 2 %
Eosinophils Absolute: 0.2 10*3/uL (ref 0.0–0.5)
Eosinophils Relative: 3 %
HCT: 45.8 % (ref 39.0–52.0)
Hemoglobin: 14.7 g/dL (ref 13.0–17.0)
Immature Granulocytes: 0 %
Lymphocytes Relative: 43 %
Lymphs Abs: 2.6 10*3/uL (ref 0.7–4.0)
MCH: 28.9 pg (ref 26.0–34.0)
MCHC: 32.1 g/dL (ref 30.0–36.0)
MCV: 90 fL (ref 80.0–100.0)
Monocytes Absolute: 0.6 10*3/uL (ref 0.1–1.0)
Monocytes Relative: 10 %
Neutro Abs: 2.6 10*3/uL (ref 1.7–7.7)
Neutrophils Relative %: 42 %
Platelets: 214 10*3/uL (ref 150–400)
RBC: 5.09 MIL/uL (ref 4.22–5.81)
RDW: 13.6 % (ref 11.5–15.5)
WBC: 6.1 10*3/uL (ref 4.0–10.5)
nRBC: 0 % (ref 0.0–0.2)

## 2023-06-15 LAB — ETHANOL: Alcohol, Ethyl (B): 241 mg/dL — ABNORMAL HIGH (ref <10)

## 2023-06-15 LAB — MAGNESIUM: Magnesium: 1.8 mg/dL (ref 1.7–2.4)

## 2023-06-15 MED ORDER — ADULT MULTIVITAMIN W/MINERALS CH: 1.0000 | ORAL_TABLET | Freq: Every day | ORAL | Status: DC

## 2023-06-15 MED ORDER — THIAMINE HCL 100 MG/ML IJ SOLN: 100.0000 mg | Freq: Every day | INTRAMUSCULAR | Status: DC

## 2023-06-15 MED ORDER — FOLIC ACID 1 MG PO TABS
1.0000 mg | ORAL_TABLET | Freq: Every day | ORAL | Status: DC
  Administered 2023-06-15: 1 mg via ORAL
  Filled 2023-06-15: qty 1

## 2023-06-15 MED ORDER — ADULT MULTIVITAMIN W/MINERALS CH
1.0000 | ORAL_TABLET | Freq: Every day | ORAL | Status: DC
Start: 2023-06-15 — End: 2023-06-15
  Administered 2023-06-15: 1 via ORAL
  Filled 2023-06-15: qty 1

## 2023-06-15 MED ORDER — FOLIC ACID 1 MG PO TABS: 1.0000 mg | ORAL_TABLET | Freq: Every day | ORAL | Status: DC

## 2023-06-15 NOTE — ED Triage Notes (Addendum)
 Pt BIBA from home, c/o Alcohol intoxication seen earlier today for the same thing, pt says he's been drinking since he has been discharged. Pt also stated "he needs ativan"

## 2023-06-15 NOTE — ED Notes (Signed)
 Pt ambulated around the department without incident.

## 2023-06-15 NOTE — Discharge Instructions (Signed)
 Please follow-up with your primary care provider closely in regards to recent visit and symptoms.  Today your labs are reassuring and you are not in acute alcohol withdrawal.  Please take medication as prescribed if symptoms change worsen please return to the ER.

## 2023-06-15 NOTE — ED Notes (Signed)
 Pt threw pulse ox at nurse.

## 2023-06-15 NOTE — ED Notes (Signed)
Pt provided a sprite

## 2023-06-15 NOTE — ED Provider Notes (Signed)
 [place] EMERGENCY DEPARTMENT AT [facility] Provider Note   CSN: [id number] Arrival date & time: 06/15/23  0043     History  Chief Complaint  Patient presents with   Alcohol Intoxication    Ruben [name] is a 73 y.o. male history of alcohol use with alcohol withdrawal, DT, thrombocytopenia, GERD presented for alcohol intoxication.  Patient was seen earlier today and ultimately discharged as he wanted to leave.  Patient states that he had 700 mL of vodka today since being discharged his last drink was 2 hours ago.  Patient denies any weakness, falls, chest pain, shortness of breath, vomiting since being discharged.  Patient states has been able to walk.  Home Medications Prior to Admission medications   Medication Sig Start Date End Date Taking? Authorizing Provider  cephALEXin (KEFLEX) 500 MG capsule Take 1 capsule (500 mg total) by mouth at bedtime. 03/27/23   Jerilee Field, MD  chlordiazePOXIDE (LIBRIUM) 25 MG capsule 50mg  PO TID x 1D, then 25-50mg  PO BID X 1D, then 25-50mg  PO QD X 1D 04/26/23   Barrie Dunker B, PA-C  cloNIDine (CATAPRES) 0.1 MG tablet Take 1 tablet (0.1 mg total) by mouth 2 (two) times daily. Patient taking differently: Take 0.1 mg by mouth at bedtime. 02/04/23   Almon Hercules, MD  desvenlafaxine (PRISTIQ) 50 MG 24 hr tablet Take 50 mg by mouth daily. 03/05/23   [provider]  finasteride (PROSCAR) 5 MG tablet Take 5 mg by mouth daily. Patient not taking: Reported on 04/12/2023    [provider]  folic acid (FOLVITE) 1 MG tablet Take 1 tablet (1 mg total) by mouth daily. 03/01/23   Osvaldo Shipper, MD  gabapentin (NEURONTIN) 300 MG capsule Take 300 mg by mouth 2 (two) times daily.    [provider]  hydrOXYzine (ATARAX) 50 MG tablet Take 50 mg by mouth daily.    [provider]  magnesium hydroxide (MILK OF MAGNESIA) 400 MG/5ML suspension Take 30 mLs by mouth every other day.    [provider]   metoprolol tartrate (LOPRESSOR) 50 MG tablet Take 50 mg by mouth 2 (two) times daily.    [provider]  oxybutynin (DITROPAN) 5 MG tablet Take 1 tablet (5 mg total) by mouth 2 (two) times daily as needed for bladder spasms. 03/27/23   Jerilee Field, MD  QUEtiapine (SEROQUEL) 300 MG tablet Take 300 mg by mouth at bedtime. 12/02/22   [provider]  rosuvastatin (CRESTOR) 20 MG tablet Take 20 mg by mouth every Monday, Wednesday, and Friday. 08/23/21   [provider]  tamsulosin (FLOMAX) 0.4 MG CAPS capsule Take 0.4 mg by mouth at bedtime. Patient not taking: Reported on 04/12/2023    [provider]  thiamine (VITAMIN B-1) 100 MG tablet Take 1 tablet (100 mg total) by mouth daily. 04/13/23   Osvaldo Shipper, MD      Allergies    Patient has no known allergies.    Review of Systems   Review of Systems  Physical Exam Updated Vital Signs BP 136/80   Pulse 99   Temp 98.8 F (37.1 C) (Oral)   Resp 18   Ht 5\' 11"  (1.803 m)   Wt 97 kg   SpO2 100%   BMI 29.83 kg/m  Physical Exam Constitutional:      General: He is not in acute distress.    Comments: Appears intoxicated  facial flushing  Eyes:     General: No scleral icterus.  Extraocular Movements: Extraocular movements intact.     Conjunctiva/sclera: Conjunctivae normal.     Pupils: Pupils are equal, round, and reactive to light.  Cardiovascular:     Rate and Rhythm: Normal rate and regular rhythm.     Pulses: Normal pulses.     Heart sounds: Normal heart sounds.  Pulmonary:     Effort: Pulmonary effort is normal. No respiratory distress.     Breath sounds: Normal breath sounds.  Abdominal:     Palpations: Abdomen is soft.     Tenderness: There is no abdominal tenderness. There is no guarding or rebound.  Musculoskeletal:        General: Normal range of motion.  Skin:    General: Skin is warm and dry.     Capillary Refill: Capillary refill takes less than 2 seconds.     Coloration:  Skin is not jaundiced.  Neurological:     Mental Status: He is alert and oriented to person, place, and time.     Comments: No tongue fasciculations Normal finger-to-nose test No tremor     ED Results / Procedures / Treatments   Labs (all labs ordered are listed, but only abnormal results are displayed) Labs Reviewed  COMPREHENSIVE METABOLIC PANEL - Abnormal; Notable for the following components:      Result Value   CO2 20 (*)    Calcium 8.4 (*)    Anion gap 17 (*)    All other components within normal limits  ETHANOL - Abnormal; Notable for the following components:   Alcohol, Ethyl (B) 241 (*)    All other components within normal limits  CBC WITH DIFFERENTIAL/PLATELET  MAGNESIUM    EKG None  Radiology No results found.  Procedures Procedures    Medications Ordered in ED Medications  thiamine (VITAMIN B1) injection 100 mg (has no administration in time range)  folic acid (FOLVITE) tablet 1 mg (1 mg Oral Given 06/15/23 0401)  multivitamin with minerals tablet 1 tablet (1 tablet Oral Given 06/15/23 0401)    ED Course/ Medical Decision Making/ A&P                                 Medical Decision Making Amount and/or Complexity of Data Reviewed Labs: ordered.  Risk Prescription drug management.   Ruben [name] 73 y.o. presented today for alcohol intoxication. Working DDx that I considered at this time includes, but not limited to, alcohol intoxication, withdrawal, DT, seizures, electrolyte abnormalities.  R/o DDx: withdrawal, DT, seizures, electrolyte abnormalities: These are considered less likely due to history of present illness, physical exam, labs/imaging findings  Review of prior external notes: 04/06/2023 office visit  Unique Tests and My Independent Interpretation:  CBC: Unremarkable CMP: Anion gap 17 most likely from alcohol use Magnesium: Unremarkable Ethanol: 241  Social Determinants of Health: EtOH/Substance Abuse  Discussion with  Independent Historian: None  Discussion of Management of Tests: None  Risk: Medium: prescription drug management  Risk Stratification Score: None  Staffed with [place], MD  Plan: On exam patient was in no acute distress but clearly intoxicated.  Patient facial flushing on exam and did tend to stumble over his words however was able to conduct a conversation with appropriate responses.  Patient has no tongue fasciculations or tremors on exam and had reassuring finger-to-nose test.  Patient is his last alcohol intake was 2 hours ago.  Patient tells me he has had 700 mL of vodka  since being discharged and upon chart review from this morning when patient was seen in discharge this is the same amount that he told the previous provider as well.  Will obtain basic labs and give vitamins however given patient's presentation do not feel patient is in acute alcohol withdrawal at the moment as patient does appear acutely intoxicated.  Patient also tells me that he would like to be discharged before his urology appointment at 1030 later this morning.  Patient's labs came back showing small anion gap most likely from alcohol use.  Patient's ethanol is 241 and given that this is his baseline and his presentation do not feel patient is having alcohol withdrawals.  Will continue to monitor the patient once he is able to ambulate and tolerate p.o. do feel he would be safe to be discharged.  Patient asked for Ativan however was on Librium last week and is thus failed benzodiazepine trial.  Will not give Ativan or Librium at this time as patient failed his trial and is not in acute withdrawal.  Patient is clinically sober and asking to be discharged. They are talking coherently, gait is normal, and is demonstrating rational thought process.  Patient has successfully passed p.o. challenge.  We shall discharge them shortly, and we have discussed the warning signs of alcohol withdrawal with them verbally, and the  information will be provided with the discharge instructions as well. At this time patient is stable to be discharged. Patient verbalized their understanding and acceptance of the plan.  Patient was given return precautions. Patient stable for discharge at this time.  Patient verbalized understanding of plan.  This chart was dictated using voice recognition software.  Despite best efforts to proofread,  errors can occur which can change the documentation meaning.         Final Clinical Impression(s) / ED Diagnoses Final diagnoses:  Alcoholic intoxication without complication [facility])    Rx / DC Orders ED Discharge Orders     None         Remi Deter 06/15/23 0452    Nira Conn, MD 06/15/23 [phone]

## 2023-06-15 NOTE — ED Notes (Signed)
 Reviewed D/C information with the patient, pt verbalized understanding. No additional concerns at this time.

## 2023-06-16 DIAGNOSIS — F10931 Alcohol use, unspecified with withdrawal delirium: Secondary | ICD-10-CM | POA: Diagnosis not present

## 2023-06-17 DIAGNOSIS — F10931 Alcohol use, unspecified with withdrawal delirium: Secondary | ICD-10-CM | POA: Diagnosis not present

## 2023-06-18 DIAGNOSIS — F10931 Alcohol use, unspecified with withdrawal delirium: Secondary | ICD-10-CM | POA: Diagnosis not present

## 2023-06-18 DIAGNOSIS — F1020 Alcohol dependence, uncomplicated: Secondary | ICD-10-CM | POA: Diagnosis not present

## 2023-06-25 DIAGNOSIS — R3914 Feeling of incomplete bladder emptying: Secondary | ICD-10-CM | POA: Diagnosis not present

## 2023-06-25 DIAGNOSIS — N401 Enlarged prostate with lower urinary tract symptoms: Secondary | ICD-10-CM | POA: Diagnosis not present

## 2023-07-03 DIAGNOSIS — R3914 Feeling of incomplete bladder emptying: Secondary | ICD-10-CM | POA: Diagnosis not present

## 2023-07-07 ENCOUNTER — Emergency Department (HOSPITAL_BASED_OUTPATIENT_CLINIC_OR_DEPARTMENT_OTHER)

## 2023-07-07 ENCOUNTER — Inpatient Hospital Stay (HOSPITAL_BASED_OUTPATIENT_CLINIC_OR_DEPARTMENT_OTHER)
Admission: EM | Admit: 2023-07-07 | Discharge: 2023-07-12 | DRG: 897 | Disposition: A | Discharge status: Home / Self Care | Attending: Family Medicine | Admitting: Family Medicine

## 2023-07-07 ENCOUNTER — Encounter (HOSPITAL_BASED_OUTPATIENT_CLINIC_OR_DEPARTMENT_OTHER): Payer: Self-pay

## 2023-07-07 DIAGNOSIS — Y907 Blood alcohol level of 200-239 mg/100 ml: Secondary | ICD-10-CM | POA: Diagnosis present

## 2023-07-07 DIAGNOSIS — R000 Tachycardia, unspecified: Secondary | ICD-10-CM | POA: Diagnosis present

## 2023-07-07 DIAGNOSIS — E860 Dehydration: Secondary | ICD-10-CM | POA: Diagnosis present

## 2023-07-07 DIAGNOSIS — Z79899 Other long term (current) drug therapy: Secondary | ICD-10-CM | POA: Diagnosis not present

## 2023-07-07 DIAGNOSIS — R4182 Altered mental status, unspecified: Secondary | ICD-10-CM | POA: Diagnosis not present

## 2023-07-07 DIAGNOSIS — R531 Weakness: Secondary | ICD-10-CM

## 2023-07-07 DIAGNOSIS — N400 Enlarged prostate without lower urinary tract symptoms: Secondary | ICD-10-CM | POA: Diagnosis not present

## 2023-07-07 DIAGNOSIS — G312 Degeneration of nervous system due to alcohol: Secondary | ICD-10-CM | POA: Diagnosis not present

## 2023-07-07 DIAGNOSIS — N390 Urinary tract infection, site not specified: Secondary | ICD-10-CM | POA: Insufficient documentation

## 2023-07-07 DIAGNOSIS — F10939 Alcohol use, unspecified with withdrawal, unspecified: Secondary | ICD-10-CM | POA: Diagnosis present

## 2023-07-07 DIAGNOSIS — E8729 Other acidosis: Secondary | ICD-10-CM | POA: Diagnosis not present

## 2023-07-07 DIAGNOSIS — Z8711 Personal history of peptic ulcer disease: Secondary | ICD-10-CM | POA: Diagnosis not present

## 2023-07-07 DIAGNOSIS — E785 Hyperlipidemia, unspecified: Secondary | ICD-10-CM | POA: Diagnosis present

## 2023-07-07 DIAGNOSIS — Z8249 Family history of ischemic heart disease and other diseases of the circulatory system: Secondary | ICD-10-CM | POA: Diagnosis not present

## 2023-07-07 DIAGNOSIS — Z9079 Acquired absence of other genital organ(s): Secondary | ICD-10-CM

## 2023-07-07 DIAGNOSIS — K219 Gastro-esophageal reflux disease without esophagitis: Secondary | ICD-10-CM | POA: Diagnosis present

## 2023-07-07 DIAGNOSIS — F10220 Alcohol dependence with intoxication, uncomplicated: Secondary | ICD-10-CM | POA: Diagnosis present

## 2023-07-07 DIAGNOSIS — F10920 Alcohol use, unspecified with intoxication, uncomplicated: Principal | ICD-10-CM

## 2023-07-07 DIAGNOSIS — K449 Diaphragmatic hernia without obstruction or gangrene: Secondary | ICD-10-CM | POA: Diagnosis not present

## 2023-07-07 DIAGNOSIS — R0989 Other specified symptoms and signs involving the circulatory and respiratory systems: Secondary | ICD-10-CM | POA: Diagnosis not present

## 2023-07-07 DIAGNOSIS — R7303 Prediabetes: Secondary | ICD-10-CM | POA: Diagnosis present

## 2023-07-07 DIAGNOSIS — F10988 Alcohol use, unspecified with other alcohol-induced disorder: Secondary | ICD-10-CM | POA: Diagnosis not present

## 2023-07-07 DIAGNOSIS — Z823 Family history of stroke: Secondary | ICD-10-CM | POA: Diagnosis not present

## 2023-07-07 DIAGNOSIS — Z860101 Personal history of adenomatous and serrated colon polyps: Secondary | ICD-10-CM

## 2023-07-07 DIAGNOSIS — F39 Unspecified mood [affective] disorder: Secondary | ICD-10-CM | POA: Diagnosis present

## 2023-07-07 DIAGNOSIS — F10239 Alcohol dependence with withdrawal, unspecified: Secondary | ICD-10-CM | POA: Diagnosis not present

## 2023-07-07 DIAGNOSIS — F419 Anxiety disorder, unspecified: Secondary | ICD-10-CM | POA: Diagnosis present

## 2023-07-07 DIAGNOSIS — S199XXA Unspecified injury of neck, initial encounter: Secondary | ICD-10-CM | POA: Diagnosis not present

## 2023-07-07 DIAGNOSIS — F10929 Alcohol use, unspecified with intoxication, unspecified: Secondary | ICD-10-CM | POA: Diagnosis not present

## 2023-07-07 DIAGNOSIS — I951 Orthostatic hypotension: Secondary | ICD-10-CM | POA: Diagnosis present

## 2023-07-07 DIAGNOSIS — S0990XA Unspecified injury of head, initial encounter: Secondary | ICD-10-CM | POA: Diagnosis not present

## 2023-07-07 DIAGNOSIS — Z803 Family history of malignant neoplasm of breast: Secondary | ICD-10-CM

## 2023-07-07 DIAGNOSIS — F10129 Alcohol abuse with intoxication, unspecified: Secondary | ICD-10-CM | POA: Diagnosis not present

## 2023-07-07 DIAGNOSIS — J341 Cyst and mucocele of nose and nasal sinus: Secondary | ICD-10-CM | POA: Diagnosis not present

## 2023-07-07 DIAGNOSIS — R251 Tremor, unspecified: Secondary | ICD-10-CM | POA: Diagnosis present

## 2023-07-07 DIAGNOSIS — I10 Essential (primary) hypertension: Secondary | ICD-10-CM | POA: Diagnosis present

## 2023-07-07 DIAGNOSIS — F10930 Alcohol use, unspecified with withdrawal, uncomplicated: Secondary | ICD-10-CM | POA: Diagnosis not present

## 2023-07-07 DIAGNOSIS — J90 Pleural effusion, not elsewhere classified: Secondary | ICD-10-CM | POA: Diagnosis not present

## 2023-07-07 DIAGNOSIS — F32A Depression, unspecified: Secondary | ICD-10-CM | POA: Diagnosis not present

## 2023-07-07 DIAGNOSIS — R109 Unspecified abdominal pain: Secondary | ICD-10-CM | POA: Diagnosis not present

## 2023-07-07 DIAGNOSIS — F10120 Alcohol abuse with intoxication, uncomplicated: Secondary | ICD-10-CM | POA: Diagnosis not present

## 2023-07-07 DIAGNOSIS — N3000 Acute cystitis without hematuria: Secondary | ICD-10-CM | POA: Diagnosis not present

## 2023-07-07 LAB — COMPREHENSIVE METABOLIC PANEL WITH GFR
ALT: 27 U/L (ref 0–44)
AST: 30 U/L (ref 15–41)
Albumin: 4 g/dL (ref 3.5–5.0)
Alkaline Phosphatase: 78 U/L (ref 38–126)
Anion gap: 23 — ABNORMAL HIGH (ref 5–15)
BUN: 18 mg/dL (ref 8–23)
CO2: 12 mmol/L — ABNORMAL LOW (ref 22–32)
Calcium: 8.6 mg/dL — ABNORMAL LOW (ref 8.9–10.3)
Chloride: 102 mmol/L (ref 98–111)
Creatinine, Ser: 1.11 mg/dL (ref 0.61–1.24)
GFR, Estimated: >60 mL/min (ref >60)
Glucose, Bld: 86 mg/dL (ref 70–99)
Potassium: 4.5 mmol/L (ref 3.5–5.1)
Sodium: 137 mmol/L (ref 135–145)
Total Bilirubin: 0.8 mg/dL (ref 0.0–1.2)
Total Protein: 8.1 g/dL (ref 6.5–8.1)

## 2023-07-07 LAB — URINALYSIS, ROUTINE W REFLEX MICROSCOPIC
Bilirubin Urine: NEGATIVE
Glucose, UA: NEGATIVE mg/dL
Ketones, ur: 40 mg/dL — AB
Nitrite: NEGATIVE
Protein, ur: 30 mg/dL — AB
Specific Gravity, Urine: 1.025 (ref 1.005–1.030)
pH: 5.5 (ref 5.0–8.0)

## 2023-07-07 LAB — CBC
HCT: 48.7 % (ref 39.0–52.0)
Hemoglobin: 16.4 g/dL (ref 13.0–17.0)
MCH: 28.9 pg (ref 26.0–34.0)
MCHC: 33.7 g/dL (ref 30.0–36.0)
MCV: 85.7 fL (ref 80.0–100.0)
Platelets: 272 10*3/uL (ref 150–400)
RBC: 5.68 MIL/uL (ref 4.22–5.81)
RDW: 13.9 % (ref 11.5–15.5)
WBC: 9.2 10*3/uL (ref 4.0–10.5)
nRBC: 0 % (ref 0.0–0.2)

## 2023-07-07 LAB — URINALYSIS, MICROSCOPIC (REFLEX): WBC, UA: >50 WBC/hpf (ref 0–5)

## 2023-07-07 LAB — ETHANOL: Alcohol, Ethyl (B): 215 mg/dL — ABNORMAL HIGH (ref <10)

## 2023-07-07 LAB — LIPASE, BLOOD: Lipase: 23 U/L (ref 11–51)

## 2023-07-07 LAB — MAGNESIUM: Magnesium: 1.9 mg/dL (ref 1.7–2.4)

## 2023-07-07 MED ORDER — LORAZEPAM 1 MG PO TABS: 1.0000 mg | ORAL_TABLET | Freq: Two times a day (BID) | ORAL | Status: DC

## 2023-07-07 MED ORDER — ADULT MULTIVITAMIN W/MINERALS CH
1.0000 | ORAL_TABLET | Freq: Every day | ORAL | Status: DC
  Administered 2023-07-07: 1 via ORAL
  Filled 2023-07-07: qty 1

## 2023-07-07 MED ORDER — LORAZEPAM 1 MG PO TABS: 1.0000 mg | ORAL_TABLET | Freq: Four times a day (QID) | ORAL | Status: DC | PRN

## 2023-07-07 MED ORDER — LORAZEPAM 2 MG/ML IJ SOLN
1.0000 mg | Freq: Once | INTRAMUSCULAR | Status: AC
  Administered 2023-07-07: 1 mg via INTRAVENOUS
  Filled 2023-07-07: qty 1

## 2023-07-07 MED ORDER — HYDROXYZINE HCL 25 MG PO TABS: 25.0000 mg | ORAL_TABLET | Freq: Four times a day (QID) | ORAL | Status: DC | PRN

## 2023-07-07 MED ORDER — SODIUM CHLORIDE 0.9 % IV BOLUS
1000.0000 mL | Freq: Once | INTRAVENOUS | Status: AC
  Administered 2023-07-07: 1000 mL via INTRAVENOUS

## 2023-07-07 MED ORDER — FOLIC ACID 1 MG PO TABS
1.0000 mg | ORAL_TABLET | Freq: Every day | ORAL | Status: DC
  Administered 2023-07-07: 1 mg via ORAL
  Filled 2023-07-07: qty 1

## 2023-07-07 MED ORDER — LORAZEPAM 1 MG PO TABS: 1.0000 mg | ORAL_TABLET | Freq: Three times a day (TID) | ORAL | Status: DC

## 2023-07-07 MED ORDER — LOPERAMIDE HCL 2 MG PO CAPS: 2.0000 mg | ORAL_CAPSULE | ORAL | Status: DC | PRN

## 2023-07-07 MED ORDER — LORAZEPAM 1 MG PO TABS: 1.0000 mg | ORAL_TABLET | Freq: Every day | ORAL | Status: DC

## 2023-07-07 MED ORDER — ONDANSETRON 4 MG PO TBDP: 4.0000 mg | ORAL_TABLET | Freq: Four times a day (QID) | ORAL | Status: AC | PRN

## 2023-07-07 MED ORDER — THIAMINE HCL 100 MG/ML IJ SOLN
100.0000 mg | Freq: Every day | INTRAMUSCULAR | Status: DC
  Administered 2023-07-07: 100 mg via INTRAVENOUS
  Filled 2023-07-07: qty 2

## 2023-07-07 MED ORDER — LORAZEPAM 1 MG PO TABS
1.0000 mg | ORAL_TABLET | Freq: Three times a day (TID) | ORAL | Status: DC
Start: 2023-07-09 — End: 2023-07-07

## 2023-07-07 MED ORDER — ONDANSETRON HCL 4 MG/2ML IJ SOLN
4.0000 mg | Freq: Once | INTRAMUSCULAR | Status: AC
  Administered 2023-07-07: 4 mg via INTRAVENOUS
  Filled 2023-07-07: qty 2

## 2023-07-07 MED ORDER — LORAZEPAM 1 MG PO TABS: 1.0000 mg | ORAL_TABLET | Freq: Four times a day (QID) | ORAL | Status: DC

## 2023-07-07 MED ORDER — IOHEXOL 300 MG/ML  SOLN
100.0000 mL | Freq: Once | INTRAMUSCULAR | Status: AC | PRN
  Administered 2023-07-07: 100 mL via INTRAVENOUS

## 2023-07-07 MED ORDER — THIAMINE MONONITRATE 100 MG PO TABS: 100.0000 mg | ORAL_TABLET | Freq: Every day | ORAL | Status: DC

## 2023-07-07 MED ORDER — LORAZEPAM 1 MG PO TABS
1.0000 mg | ORAL_TABLET | Freq: Four times a day (QID) | ORAL | Status: DC
Start: 2023-07-07 — End: 2023-07-07

## 2023-07-07 MED ORDER — LORAZEPAM 2 MG/ML IJ SOLN
2.0000 mg | Freq: Once | INTRAMUSCULAR | Status: AC
  Administered 2023-07-07: 2 mg via INTRAVENOUS
  Filled 2023-07-07: qty 1

## 2023-07-07 NOTE — ED Provider Notes (Signed)
 [place] [facility] Provider Note   CSN: [id number] Arrival date & time: 07/07/23  1542     History  Chief Complaint  Patient presents with   Alcohol Intoxication    Ruben [name] is a 73 y.o. male with history of hypertension, erectile dysfunction, GERD, acute renal failure, BPH, alcohol use disorder.  The patient presents to the ED for evaluation.  Per EMS, neighbors called EMS today due to concern that the patient did not been seen moving around the house as "he normally does" for quite some time.  According to triage note, the patient states that he has been drinking more alcohol than usual today.  He endorses depression recently as the cause of his increased alcohol intake.  Patient reports he has been to detox in the past, has been hospitalized due to alcohol withdrawal.  He reports his last alcoholic beverage was 1 hour ago.  The patient is alert and oriented x 3 on my examination however is having a hard time following commands.  There are movements with the patient seems to be staring off into space and is unable to follow commands.  He reports he has drank "700 mL" of alcohol today but this is a similar story that he provides each time he comes to the ED.  He reports that he drinks vodka.   Alcohol Intoxication       Home Medications Prior to Admission medications   Medication Sig Start Date End Date Taking? Authorizing Provider  cephALEXin (KEFLEX) 500 MG capsule Take 1 capsule (500 mg total) by mouth at bedtime. 03/27/23   Jerilee Field, MD  chlordiazePOXIDE (LIBRIUM) 25 MG capsule 50mg  PO TID x 1D, then 25-50mg  PO BID X 1D, then 25-50mg  PO QD X 1D 04/26/23   Barrie Dunker B, PA-C  cloNIDine (CATAPRES) 0.1 MG tablet Take 1 tablet (0.1 mg total) by mouth 2 (two) times daily. Patient taking differently: Take 0.1 mg by mouth at bedtime. 02/04/23   Almon Hercules, MD  desvenlafaxine (PRISTIQ) 50 MG 24 hr tablet Take 50 mg by mouth  daily. 03/05/23   [provider]  finasteride (PROSCAR) 5 MG tablet Take 5 mg by mouth daily. Patient not taking: Reported on 04/12/2023    [provider]  folic acid (FOLVITE) 1 MG tablet Take 1 tablet (1 mg total) by mouth daily. 03/01/23   Osvaldo Shipper, MD  gabapentin (NEURONTIN) 300 MG capsule Take 300 mg by mouth 2 (two) times daily.    [provider]  hydrOXYzine (ATARAX) 50 MG tablet Take 50 mg by mouth daily.    [provider]  magnesium hydroxide (MILK OF MAGNESIA) 400 MG/5ML suspension Take 30 mLs by mouth every other day.    [provider]  metoprolol tartrate (LOPRESSOR) 50 MG tablet Take 50 mg by mouth 2 (two) times daily.    [provider]  oxybutynin (DITROPAN) 5 MG tablet Take 1 tablet (5 mg total) by mouth 2 (two) times daily as needed for bladder spasms. 03/27/23   Jerilee Field, MD  QUEtiapine (SEROQUEL) 300 MG tablet Take 300 mg by mouth at bedtime. 12/02/22   [provider]  rosuvastatin (CRESTOR) 20 MG tablet Take 20 mg by mouth every Monday, Wednesday, and Friday. 08/23/21   [provider]  tamsulosin (FLOMAX) 0.4 MG CAPS capsule Take 0.4 mg by mouth at bedtime. Patient not taking: Reported on 04/12/2023    [provider]  thiamine (VITAMIN B-1) 100  MG tablet Take 1 tablet (100 mg total) by mouth daily. 04/13/23   Osvaldo Shipper, MD      Allergies    Patient has no known allergies.    Review of Systems   Review of Systems  Unable to perform ROS: Other (Level 5 caveat)  All other systems reviewed and are negative.   Physical Exam Updated Vital Signs BP 138/80   Pulse (!) 113   Temp 97.8 F (36.6 C) (Rectal)   Resp 19   Ht 5\' 11"  (1.803 m)   Wt 97 kg   SpO2 93%   BMI 29.83 kg/m  Physical Exam Vitals and nursing note reviewed.  Constitutional:      General: He is not in acute distress.    Appearance: He is well-developed.  HENT:     Head: Normocephalic and  atraumatic.  Eyes:     Conjunctiva/sclera: Conjunctivae normal.  Cardiovascular:     Rate and Rhythm: Regular rhythm. Tachycardia present.     Heart sounds: No murmur heard. Pulmonary:     Effort: Pulmonary effort is normal. No respiratory distress.     Breath sounds: Normal breath sounds.  Abdominal:     Palpations: Abdomen is soft.     Tenderness: There is abdominal tenderness. There is rebound.     Comments: TTP of abdomen, no overlying skin change  Musculoskeletal:        General: No swelling.     Cervical back: Neck supple.  Skin:    General: Skin is warm and dry.     Capillary Refill: Capillary refill takes less than 2 seconds.  Neurological:     Mental Status: He is alert and oriented to person, place, and time.     Comments: Patient intermittently follows commands.  Equal grip strength upper extremities bilaterally.  Equal strength bilateral lower extremities.  Moves all extremities in a symmetric and coordinated fashion.  Psychiatric:        Mood and Affect: Mood normal.     ED Results / Procedures / Treatments   Labs (all labs ordered are listed, but only abnormal results are displayed) Labs Reviewed  COMPREHENSIVE METABOLIC PANEL WITH GFR - Abnormal; Notable for the following components:      Result Value   CO2 12 (*)    Calcium 8.6 (*)    Anion gap 23 (*)    All other components within normal limits  ETHANOL - Abnormal; Notable for the following components:   Alcohol, Ethyl (B) 215 (*)    All other components within normal limits  CBC  URINALYSIS, ROUTINE W REFLEX MICROSCOPIC  LIPASE, BLOOD  MAGNESIUM    EKG EKG Interpretation Date/Time:  Saturday July 07 2023 16:07:00 EDT Ventricular Rate:  111 PR Interval:  139 QRS Duration:  84 QT Interval:  345 QTC Calculation: 469 R Axis:   172  Text Interpretation: Sinus tachycardia Inferior infarct, old since last tracing no significant change Confirmed by Rolan Bucco [id number]) on 07/07/2023 4:53:16  PM  Radiology DG Chest Port 1 View Result Date: 07/07/2023 CLINICAL DATA:  Altered mental status.  Alcohol consumption. EXAM: PORTABLE CHEST 1 VIEW COMPARISON:  Radiographs 05/27/2023 and 04/11/2023.  CT 12/17/2022. FINDINGS: 1727 hours. Incomplete visualization of the left costophrenic angle on this portable examination. The heart size is stable at the upper limits of normal. The overall aeration of the left lung base appears improved from the most recent prior study. The right lung is clear. No evidence of pneumothorax or significant pleural  effusion. The bones appear unchanged. Telemetry leads overlie the chest. IMPRESSION: Improved aeration of the left lung base compared with recent prior study. No new findings. Electronically Signed   By: Carey Bullocks M.D.   On: 07/07/2023 17:42    Procedures Procedures   Medications Ordered in ED Medications  thiamine (VITAMIN B1) tablet 100 mg ( Oral See Alternative 07/07/23 1633)    Or  thiamine (VITAMIN B1) injection 100 mg (100 mg Intravenous Given 07/07/23 1633)  folic acid (FOLVITE) tablet 1 mg (1 mg Oral Given 07/07/23 1636)  multivitamin with minerals tablet 1 tablet (1 tablet Oral Given 07/07/23 1636)  sodium chloride 0.9 % bolus 1,000 mL (has no administration in time range)  ondansetron (ZOFRAN) injection 4 mg (4 mg Intravenous Given 07/07/23 1633)  sodium chloride 0.9 % bolus 1,000 mL (0 mLs Intravenous Stopped 07/07/23 1708)  LORazepam (ATIVAN) injection 1 mg (1 mg Intravenous Given 07/07/23 1633)  iohexol (OMNIPAQUE) 300 MG/ML solution 100 mL (100 mLs Intravenous Contrast Given 07/07/23 1800)    ED Course/ Medical Decision Making/ A&P Clinical Course as of 07/07/23 1841  Sat Jul 07, 2023  1704 CIWA 2 pre-ativan [CG]  1820 Patient repeatedly requesting ativan, CIWA score 2, EtOH 215. No signs of withdrawal to indicate ativan administration. [CG]    Clinical Course User Index [CG] Al Decant, PA-C   Medical Decision  Making Amount and/or Complexity of Data Reviewed Labs: ordered. Radiology: ordered.  Risk OTC drugs. Prescription drug management.   73 year old male presents for evaluation.  Please see HPI for further details.  On examination patient is afebrile, tachycardic.  Lung sounds are clear bilaterally, not hypoxic.  Abdomen has tenderness in the left lower quadrant of her right lower quadrant with rebound.  Neuroexam reveals patient intermittently able to follow commands.  Has equal grip strength upper extremities bilaterally.  Equal strength bilateral lower extremities.  Moving all extremities in a coordinated fashion.  Will provide patient with 1 mg of Ativan, 4 of Zofran for nausea.  Will provide patient 1 L of fluid for tachycardia.  Will also provide with folic acid, multivitamin and thiamine.  Will collect labs to include CBC, ethanol, urinalysis, CMP.  Will also image patient with CT scan of head, cervical spine, abdomen and pelvis and also chest x-ray due to concern of aspiration.  CBC without leukocytosis or anemia.  CMP without electrolyte derangement, no elevated LFTs.  The patient does have an anion gap to 23 raising concern for alcoholic ketoacidosis.  Patient ethanol elevated to 215.  Chest x-ray shows no consolidations or effusions.  Patient CIWA score 2.  The patient is repeatedly requesting Ativan.  There is no indications for Ativan at this time.  He has a CIWA score of 2 and his EtOH is 215.  He has already received 1 mg of Ativan.  CT scan abdomen pelvis, CT head, CT cervical pending at this time.  Patient given additional liter of fluid for persistent tachycardia.  Patient will most likely need to be reevaluated to determine ultimate disposition.  In my shift, patient workup not complete.  Signed out to attending Dr. Fredderick Phenix.   Final Clinical Impression(s) / ED Diagnoses Final diagnoses:  Alcoholic intoxication without complication [facility])  Alcoholic ketoacidosis    Rx / DC  Orders ED Discharge Orders     None         Al Decant, PA-C 07/07/23 1841    Rolan Bucco, MD 07/07/23 [phone]

## 2023-07-07 NOTE — ED Notes (Signed)
 Patient is a hard stick. PA aware and will attempt an US guided IV.

## 2023-07-07 NOTE — ED Notes (Signed)
 Patient transported to CT

## 2023-07-07 NOTE — ED Notes (Signed)
 Called lab to re add on labs.

## 2023-07-07 NOTE — ED Triage Notes (Addendum)
 BIB EMS, states he drank more alcohol than usual today. Has had more depression recently, which has caused him to drink more. States has done detox previously but doesn't want to do that today. Neighbors called EMS because they were concerned he hasn't been moving around the house as he normally does.  Patient States he drank a bottle of liquor today, hasn't eaten anything, feels nauseous. Denies SI/HI, drinks to numb the pain. Denies other complaints. Arrives in pants soiled with urine. Alert and oriented x1. Denies drug use today

## 2023-07-07 NOTE — ED Notes (Signed)
 Fall prevention sign on door Fall socks Fall wristband

## 2023-07-07 NOTE — Plan of Care (Addendum)
 Plan of Care Note for accepted transfer  Patient: Ruben [name]              WJX:[id number]  DOA: 07/07/2023     Facility requesting transfer: [facility] emergency department Requesting Provider: Rolan Bucco, MD    Reason for transfer: Alcohol withdrawal  ED triage note:BIB EMS, states he drank more alcohol than usual today. Has had more depression recently, which has caused him to drink more. States has done detox previously but doesn't want to do that today. Neighbors called EMS because they were concerned he hasn't been moving around the house as he normally does.   Patient States he drank a bottle of liquor today, hasn't eaten anything, feels nauseous. Denies SI/HI, drinks to numb the pain. Denies other complaints. Arrives in pants soiled with urine. Alert and oriented x1. Denies drug use today  Facility course: 73 year old man history of chronic alcohol abuse, essential hypertension, GERD, BPH and hyperlipidemia presented to emergency department via EMS for evaluation for extensive alcohol use.  Per EMS, neighbors called EMS today due to concern that the patient did not been seen moving around the house as "he normally does" for quite some time.  According to triage note, the patient states that he has been drinking more alcohol than usual today.  He endorses depression recently as the cause of his increased alcohol intake.  Patient reports he has been to detox in the past, has been hospitalized due to alcohol withdrawal.  He reports his last alcoholic beverage was 1 hour ago.  The patient is alert. There are movements with the patient seems to be staring off into space and is unable to follow commands.  He reports he has drank "700 mL" of alcohol today but this is a similar story that he provides each time he comes to the ED.  He reports that he drinks vodka.    At presentation to ED patient is significantly tachycardic heart rate went up to 136.  Hypertensive blood pressure is  156/88.  EKG showing sinus tachycardia 111. Blood alcohol level is 215. CMP showing low bicarb 12, elevated anion gap 23.  Normal mag level.  Normal lipase level. CBC unremarkable.  CT abdomen pelvis showing hepatic steatosis.  CT cervical spine no acute intracranial abnormality or cervical fracture.  CT head no acute endocrine abnormality.  Chest x-ray Improved aeration of the left lung base compared with recent prior study. No new findings.   In the ED patient has been given Ativan total 3 mg due to alcohol withdrawal and 2 L of NS bolus.  Hospitalist has been consulted for further management of acute alcohol withdrawal.   Plan of care: The patient is accepted for admission for observation status to Telemetry unit, at [facility].  [facility] will assume care on arrival to accepting facility. Until arrival, care as per EDP. However, [facility] available 24/7 for questions and assistance.   Check [email or web address] for on-call coverage.   Nursing staff, please call [facility] Admits & Consults System-Wide number under Amion on patient's arrival so appropriate admitting provider can evaluate the pt.    Author: Tereasa Coop, MD  07/07/2023  [facility]

## 2023-07-07 NOTE — ED Notes (Signed)
 [facility] called for transport.

## 2023-07-07 NOTE — ED Notes (Signed)
 ED Provider at bedside.

## 2023-07-07 NOTE — ED Notes (Signed)
 2 Chad @ [facility] was called to inform them that the patient was leaving the ED and since nobody had checked off the handoff to do so soon and if they have any questions to just call me per protocol.

## 2023-07-08 ENCOUNTER — Encounter (HOSPITAL_COMMUNITY): Payer: Self-pay | Admitting: Internal Medicine

## 2023-07-08 DIAGNOSIS — Z79899 Other long term (current) drug therapy: Secondary | ICD-10-CM | POA: Diagnosis not present

## 2023-07-08 DIAGNOSIS — R0989 Other specified symptoms and signs involving the circulatory and respiratory systems: Secondary | ICD-10-CM | POA: Diagnosis not present

## 2023-07-08 DIAGNOSIS — F10939 Alcohol use, unspecified with withdrawal, unspecified: Secondary | ICD-10-CM | POA: Diagnosis present

## 2023-07-08 DIAGNOSIS — R7303 Prediabetes: Secondary | ICD-10-CM | POA: Diagnosis present

## 2023-07-08 DIAGNOSIS — I951 Orthostatic hypotension: Secondary | ICD-10-CM | POA: Diagnosis present

## 2023-07-08 DIAGNOSIS — F39 Unspecified mood [affective] disorder: Secondary | ICD-10-CM | POA: Diagnosis present

## 2023-07-08 DIAGNOSIS — F10930 Alcohol use, unspecified with withdrawal, uncomplicated: Secondary | ICD-10-CM | POA: Diagnosis not present

## 2023-07-08 DIAGNOSIS — Z8249 Family history of ischemic heart disease and other diseases of the circulatory system: Secondary | ICD-10-CM | POA: Diagnosis not present

## 2023-07-08 DIAGNOSIS — G312 Degeneration of nervous system due to alcohol: Secondary | ICD-10-CM | POA: Diagnosis present

## 2023-07-08 DIAGNOSIS — N390 Urinary tract infection, site not specified: Secondary | ICD-10-CM | POA: Insufficient documentation

## 2023-07-08 DIAGNOSIS — E785 Hyperlipidemia, unspecified: Secondary | ICD-10-CM | POA: Diagnosis present

## 2023-07-08 DIAGNOSIS — Z823 Family history of stroke: Secondary | ICD-10-CM | POA: Diagnosis not present

## 2023-07-08 DIAGNOSIS — R000 Tachycardia, unspecified: Secondary | ICD-10-CM | POA: Diagnosis present

## 2023-07-08 DIAGNOSIS — F419 Anxiety disorder, unspecified: Secondary | ICD-10-CM | POA: Diagnosis present

## 2023-07-08 DIAGNOSIS — N3000 Acute cystitis without hematuria: Secondary | ICD-10-CM | POA: Diagnosis not present

## 2023-07-08 DIAGNOSIS — J90 Pleural effusion, not elsewhere classified: Secondary | ICD-10-CM | POA: Diagnosis not present

## 2023-07-08 DIAGNOSIS — Z9079 Acquired absence of other genital organ(s): Secondary | ICD-10-CM | POA: Diagnosis not present

## 2023-07-08 DIAGNOSIS — Y907 Blood alcohol level of 200-239 mg/100 ml: Secondary | ICD-10-CM | POA: Diagnosis present

## 2023-07-08 DIAGNOSIS — N400 Enlarged prostate without lower urinary tract symptoms: Secondary | ICD-10-CM | POA: Diagnosis present

## 2023-07-08 DIAGNOSIS — Z860101 Personal history of adenomatous and serrated colon polyps: Secondary | ICD-10-CM | POA: Diagnosis not present

## 2023-07-08 DIAGNOSIS — E8729 Other acidosis: Secondary | ICD-10-CM | POA: Diagnosis present

## 2023-07-08 DIAGNOSIS — R251 Tremor, unspecified: Secondary | ICD-10-CM | POA: Diagnosis present

## 2023-07-08 DIAGNOSIS — I10 Essential (primary) hypertension: Secondary | ICD-10-CM | POA: Diagnosis present

## 2023-07-08 DIAGNOSIS — E860 Dehydration: Secondary | ICD-10-CM | POA: Diagnosis present

## 2023-07-08 DIAGNOSIS — K219 Gastro-esophageal reflux disease without esophagitis: Secondary | ICD-10-CM | POA: Diagnosis present

## 2023-07-08 DIAGNOSIS — F10239 Alcohol dependence with withdrawal, unspecified: Secondary | ICD-10-CM | POA: Diagnosis present

## 2023-07-08 DIAGNOSIS — Z803 Family history of malignant neoplasm of breast: Secondary | ICD-10-CM | POA: Diagnosis not present

## 2023-07-08 DIAGNOSIS — Z8711 Personal history of peptic ulcer disease: Secondary | ICD-10-CM | POA: Diagnosis not present

## 2023-07-08 DIAGNOSIS — F10220 Alcohol dependence with intoxication, uncomplicated: Secondary | ICD-10-CM | POA: Diagnosis present

## 2023-07-08 LAB — MAGNESIUM: Magnesium: 1.9 mg/dL (ref 1.7–2.4)

## 2023-07-08 LAB — LACTIC ACID, PLASMA: Lactic Acid, Venous: 1.6 mmol/L (ref 0.5–1.9)

## 2023-07-08 LAB — BLOOD GAS, VENOUS
Acid-base deficit: 4.4 mmol/L — ABNORMAL HIGH (ref 0.0–2.0)
Bicarbonate: 21 mmol/L (ref 20.0–28.0)
Drawn by: 59174
O2 Saturation: 80.4 %
Patient temperature: 36.7
pCO2, Ven: 38 mmHg — ABNORMAL LOW (ref 44–60)
pH, Ven: 7.34 (ref 7.25–7.43)
pO2, Ven: 45 mmHg (ref 32–45)

## 2023-07-08 LAB — PHOSPHORUS: Phosphorus: 2 mg/dL — ABNORMAL LOW (ref 2.5–4.6)

## 2023-07-08 LAB — CBC
HCT: 46.2 % (ref 39.0–52.0)
Hemoglobin: 15.4 g/dL (ref 13.0–17.0)
MCH: 28.5 pg (ref 26.0–34.0)
MCHC: 33.3 g/dL (ref 30.0–36.0)
MCV: 85.4 fL (ref 80.0–100.0)
Platelets: 224 10*3/uL (ref 150–400)
RBC: 5.41 MIL/uL (ref 4.22–5.81)
RDW: 13.8 % (ref 11.5–15.5)
WBC: 6.2 10*3/uL (ref 4.0–10.5)
nRBC: 0 % (ref 0.0–0.2)

## 2023-07-08 LAB — BASIC METABOLIC PANEL WITH GFR
Anion gap: 14 (ref 5–15)
BUN: 13 mg/dL (ref 8–23)
CO2: 16 mmol/L — ABNORMAL LOW (ref 22–32)
Calcium: 8.4 mg/dL — ABNORMAL LOW (ref 8.9–10.3)
Chloride: 106 mmol/L (ref 98–111)
Creatinine, Ser: 1.05 mg/dL (ref 0.61–1.24)
GFR, Estimated: >60 mL/min (ref >60)
Glucose, Bld: 110 mg/dL — ABNORMAL HIGH (ref 70–99)
Potassium: 4.4 mmol/L (ref 3.5–5.1)
Sodium: 136 mmol/L (ref 135–145)

## 2023-07-08 MED ORDER — LORAZEPAM 2 MG/ML IJ SOLN: 1.0000 mg | INTRAMUSCULAR | Status: AC | PRN

## 2023-07-08 MED ORDER — GABAPENTIN 300 MG PO CAPS
300.0000 mg | ORAL_CAPSULE | Freq: Two times a day (BID) | ORAL | Status: DC
  Administered 2023-07-08 – 2023-07-12 (×9): 300 mg via ORAL
  Filled 2023-07-08 (×9): qty 1

## 2023-07-08 MED ORDER — THIAMINE HCL 100 MG/ML IJ SOLN
100.0000 mg | Freq: Every day | INTRAMUSCULAR | Status: DC
  Administered 2023-07-12: 100 mg via INTRAVENOUS
  Filled 2023-07-08 (×2): qty 2

## 2023-07-08 MED ORDER — QUETIAPINE FUMARATE 100 MG PO TABS
300.0000 mg | ORAL_TABLET | Freq: Every day | ORAL | Status: DC
  Administered 2023-07-08 – 2023-07-11 (×5): 300 mg via ORAL
  Filled 2023-07-08 (×5): qty 3

## 2023-07-08 MED ORDER — CLONIDINE HCL 0.1 MG PO TABS
0.1000 mg | ORAL_TABLET | Freq: Two times a day (BID) | ORAL | Status: DC
  Administered 2023-07-08 – 2023-07-12 (×10): 0.1 mg via ORAL
  Filled 2023-07-08 (×10): qty 1

## 2023-07-08 MED ORDER — ADULT MULTIVITAMIN W/MINERALS CH
1.0000 | ORAL_TABLET | Freq: Every day | ORAL | Status: DC
  Administered 2023-07-08 – 2023-07-12 (×5): 1 via ORAL
  Filled 2023-07-08 (×5): qty 1

## 2023-07-08 MED ORDER — THIAMINE MONONITRATE 100 MG PO TABS
100.0000 mg | ORAL_TABLET | Freq: Every day | ORAL | Status: DC
  Administered 2023-07-08 – 2023-07-12 (×5): 100 mg via ORAL
  Filled 2023-07-08 (×6): qty 1

## 2023-07-08 MED ORDER — HYDROXYZINE HCL 25 MG PO TABS
50.0000 mg | ORAL_TABLET | Freq: Every evening | ORAL | Status: DC | PRN
  Administered 2023-07-08: 50 mg via ORAL
  Filled 2023-07-08: qty 2

## 2023-07-08 MED ORDER — ROSUVASTATIN CALCIUM 20 MG PO TABS
20.0000 mg | ORAL_TABLET | ORAL | Status: DC
  Administered 2023-07-09 – 2023-07-12 (×3): 20 mg via ORAL
  Filled 2023-07-08 (×3): qty 1

## 2023-07-08 MED ORDER — SODIUM CHLORIDE 0.9% FLUSH
3.0000 mL | Freq: Two times a day (BID) | INTRAVENOUS | Status: DC
  Administered 2023-07-08 – 2023-07-11 (×4): 3 mL via INTRAVENOUS

## 2023-07-08 MED ORDER — CHLORDIAZEPOXIDE HCL 25 MG PO CAPS
25.0000 mg | ORAL_CAPSULE | Freq: Two times a day (BID) | ORAL | Status: AC
  Administered 2023-07-11 (×2): 25 mg via ORAL
  Filled 2023-07-08 (×2): qty 1

## 2023-07-08 MED ORDER — MELATONIN 3 MG PO TABS
6.0000 mg | ORAL_TABLET | Freq: Every evening | ORAL | Status: DC | PRN
  Administered 2023-07-08 – 2023-07-09 (×2): 6 mg via ORAL
  Filled 2023-07-08 (×3): qty 2

## 2023-07-08 MED ORDER — SODIUM CHLORIDE 0.9 % IV SOLN
INTRAVENOUS | Status: AC
  Administered 2023-07-08: 500 mL via INTRAVENOUS

## 2023-07-08 MED ORDER — LORAZEPAM 1 MG PO TABS: 1.0000 mg | ORAL_TABLET | ORAL | Status: AC | PRN

## 2023-07-08 MED ORDER — SODIUM CHLORIDE 0.9 % IV SOLN
1.0000 g | INTRAVENOUS | Status: DC
  Administered 2023-07-08: 1 g via INTRAVENOUS
  Filled 2023-07-08: qty 10

## 2023-07-08 MED ORDER — ACETAMINOPHEN 500 MG PO TABS
1000.0000 mg | ORAL_TABLET | Freq: Four times a day (QID) | ORAL | Status: DC | PRN
Start: 2023-07-08 — End: 2023-07-12

## 2023-07-08 MED ORDER — K PHOS MONO-SOD PHOS DI & MONO 155-852-130 MG PO TABS
250.0000 mg | ORAL_TABLET | Freq: Two times a day (BID) | ORAL | Status: AC
Start: 2023-07-08 — End: 2023-07-11
  Administered 2023-07-08 – 2023-07-10 (×6): 250 mg via ORAL
  Filled 2023-07-08 (×6): qty 1

## 2023-07-08 MED ORDER — CHLORDIAZEPOXIDE HCL 25 MG PO CAPS
50.0000 mg | ORAL_CAPSULE | Freq: Two times a day (BID) | ORAL | Status: AC
  Administered 2023-07-10 (×2): 50 mg via ORAL
  Filled 2023-07-08 (×2): qty 2

## 2023-07-08 MED ORDER — POLYETHYLENE GLYCOL 3350 17 G PO PACK
17.0000 g | PACK | Freq: Every day | ORAL | Status: DC | PRN
  Administered 2023-07-11: 17 g via ORAL
  Filled 2023-07-08: qty 1

## 2023-07-08 MED ORDER — ONDANSETRON HCL 4 MG/2ML IJ SOLN: 4.0000 mg | Freq: Four times a day (QID) | INTRAMUSCULAR | Status: DC | PRN

## 2023-07-08 MED ORDER — TAMSULOSIN HCL 0.4 MG PO CAPS
0.4000 mg | ORAL_CAPSULE | Freq: Every day | ORAL | Status: DC
  Administered 2023-07-08 – 2023-07-11 (×5): 0.4 mg via ORAL
  Filled 2023-07-08 (×5): qty 1

## 2023-07-08 MED ORDER — CHLORDIAZEPOXIDE HCL 25 MG PO CAPS
50.0000 mg | ORAL_CAPSULE | Freq: Three times a day (TID) | ORAL | Status: AC
  Administered 2023-07-09 (×3): 50 mg via ORAL
  Filled 2023-07-08 (×3): qty 2

## 2023-07-08 MED ORDER — SODIUM PHOSPHATES 45 MMOLE/15ML IV SOLN
15.0000 mmol | Freq: Once | INTRAVENOUS | Status: DC
  Administered 2023-07-08: 15 mmol via INTRAVENOUS
  Filled 2023-07-08: qty 5

## 2023-07-08 MED ORDER — ENOXAPARIN SODIUM 40 MG/0.4ML IJ SOSY
40.0000 mg | PREFILLED_SYRINGE | INTRAMUSCULAR | Status: DC
  Administered 2023-07-08 – 2023-07-12 (×5): 40 mg via SUBCUTANEOUS
  Filled 2023-07-08 (×5): qty 0.4

## 2023-07-08 MED ORDER — CHLORDIAZEPOXIDE HCL 25 MG PO CAPS
50.0000 mg | ORAL_CAPSULE | Freq: Four times a day (QID) | ORAL | Status: AC
Start: 2023-07-08 — End: 2023-07-08
  Administered 2023-07-08 (×4): 50 mg via ORAL
  Filled 2023-07-08 (×5): qty 2

## 2023-07-08 MED ORDER — METOPROLOL TARTRATE 50 MG PO TABS
50.0000 mg | ORAL_TABLET | Freq: Two times a day (BID) | ORAL | Status: DC
  Administered 2023-07-08 – 2023-07-12 (×10): 50 mg via ORAL
  Filled 2023-07-08 (×10): qty 1

## 2023-07-08 MED ORDER — FOLIC ACID 1 MG PO TABS
1.0000 mg | ORAL_TABLET | Freq: Every day | ORAL | Status: DC
  Administered 2023-07-08 – 2023-07-12 (×5): 1 mg via ORAL
  Filled 2023-07-08 (×5): qty 1

## 2023-07-08 MED ORDER — FINASTERIDE 5 MG PO TABS
5.0000 mg | ORAL_TABLET | Freq: Every day | ORAL | Status: DC
  Administered 2023-07-08 – 2023-07-12 (×5): 5 mg via ORAL
  Filled 2023-07-08 (×5): qty 1

## 2023-07-08 NOTE — H&P (Addendum)
 History and Physical    Ruben [name] WUJ:[id number] DOB: 25-Aug-1950 DOA: 07/07/2023  PCP: Soundra Pilon, FNP   Patient coming from:  Transfer from [facility] ED   Chief Complaint:  Chief Complaint  Patient presents with   Alcohol Intoxication    HPI:  Ruben [name] is a 73 y.o. male with hx of alcohol use disorder, hypertension, hyperlipidemia, BPH, mood disorder,  who was transferred from [facility] ED for alcohol withdrawal.  He was brought in by EMS after neighbors called for wellness check because they had not seen him moving around the house as he normally does.  Patient acknowledges heavy alcohol use ever since retiring in 2020.  Drinking approximately 700 mL of liquor per day.  No history of seizure or DTs per his knowledge.  He is interested in quitting drinking.  He feels depressed but has no suicidal ideation.  Has tried acamprosate but not currently taking.  Also previously on naltrexone but states he "did not give it a good go".  Otherwise has nausea but no vomiting.  No other recent illness.  Very bothered by BPH symptoms.  Was followed by urology outpatient.   Review of Systems:  ROS complete and negative except as marked above   No Known Allergies  Prior to Admission medications   Medication Sig Start Date End Date Taking? Authorizing Provider  cephALEXin (KEFLEX) 500 MG capsule Take 1 capsule (500 mg total) by mouth at bedtime. 03/27/23   Jerilee Field, MD  chlordiazePOXIDE (LIBRIUM) 25 MG capsule 50mg  PO TID x 1D, then 25-50mg  PO BID X 1D, then 25-50mg  PO QD X 1D 04/26/23   Barrie Dunker B, PA-C  cloNIDine (CATAPRES) 0.1 MG tablet Take 1 tablet (0.1 mg total) by mouth 2 (two) times daily. Patient taking differently: Take 0.1 mg by mouth at bedtime. 02/04/23   Almon Hercules, MD  desvenlafaxine (PRISTIQ) 50 MG 24 hr tablet Take 50 mg by mouth daily. 03/05/23   [provider]  finasteride (PROSCAR) 5 MG tablet Take 5 mg by mouth daily. Patient not taking:  Reported on 04/12/2023    [provider]  folic acid (FOLVITE) 1 MG tablet Take 1 tablet (1 mg total) by mouth daily. 03/01/23   Osvaldo Shipper, MD  gabapentin (NEURONTIN) 300 MG capsule Take 300 mg by mouth 2 (two) times daily.    [provider]  hydrOXYzine (ATARAX) 50 MG tablet Take 50 mg by mouth daily.    [provider]  magnesium hydroxide (MILK OF MAGNESIA) 400 MG/5ML suspension Take 30 mLs by mouth every other day.    [provider]  metoprolol tartrate (LOPRESSOR) 50 MG tablet Take 50 mg by mouth 2 (two) times daily.    [provider]  oxybutynin (DITROPAN) 5 MG tablet Take 1 tablet (5 mg total) by mouth 2 (two) times daily as needed for bladder spasms. 03/27/23   Jerilee Field, MD  QUEtiapine (SEROQUEL) 300 MG tablet Take 300 mg by mouth at bedtime. 12/02/22   [provider]  rosuvastatin (CRESTOR) 20 MG tablet Take 20 mg by mouth every Monday, Wednesday, and Friday. 08/23/21   [provider]  tamsulosin (FLOMAX) 0.4 MG CAPS capsule Take 0.4 mg by mouth at bedtime. Patient not taking: Reported on 04/12/2023    [provider]  thiamine (VITAMIN B-1) 100 MG tablet Take 1 tablet (100 mg total) by mouth daily. 04/13/23   Osvaldo Shipper, MD    Past Medical History:  Diagnosis Date  Alcoholism (HCC)    Anxiety    Aortic atherosclerosis (HCC) 12/26/2016   Noted on CT   Arthritis    BPH (benign prostatic hyperplasia)    Chronic constipation    takes magnesium   Depression    Duodenal ulcer    Dyspnea 12/17/2017   ED (erectile dysfunction)    GERD (gastroesophageal reflux disease)    Heart palpitations    Occ   Hiatal hernia    History of adenomatous polyp of colon    tubular adenoma's 2005   History of Helicobacter pylori infection    2008   Hypercholesteremia    Hypertension    MR (mitral regurgitation)    Mild, noted on ECHO   Palpitations    Pre-diabetes    Pulmonary nodule 12/26/2016    noted on CT, 5 x 3 mm nodular opacity right upper lobe   Rosacea    Schatzki's ring    last dilated 02/ 2016   Substance abuse (HCC)    Urinary retention    history of   Varicocele 02/25/2014   Small to moderate left varicocele    Past Surgical History:  Procedure Laterality Date   BALLOON DILATION N/A 06/08/2014   Procedure: BALLOON DILATION;  Surgeon: Charolett Bumpers, MD;  Location: [facility];  Service: Endoscopy;  Laterality: N/A;   COLONOSCOPY WITH PROPOFOL N/A 06/08/2014   Procedure: COLONOSCOPY WITH PROPOFOL;  Surgeon: Charolett Bumpers, MD;  Location: [facility];  Service: Endoscopy;  Laterality: N/A;   ESOPHAGOGASTRODUODENOSCOPY N/A 06/08/2014   Procedure: ESOPHAGOGASTRODUODENOSCOPY (EGD);  Surgeon: Charolett Bumpers, MD;  Location: [facility];  Service: Endoscopy;  Laterality: N/A;   ESOPHAGOGASTRODUODENOSCOPY (EGD) WITH ESOPHAGEAL DILATION N/A 12/31/2012   Procedure: ESOPHAGOGASTRODUODENOSCOPY (EGD) WITH ESOPHAGEAL DILATION;  Surgeon: Charolett Bumpers, MD;  Location: [facility];  Service: Endoscopy;  Laterality: N/A;   GREEN LIGHT LASER TURP (TRANSURETHRAL RESECTION OF PROSTATE N/A 12/28/2015   Procedure: GREEN LIGHT LASER TURP (TRANSURETHRAL RESECTION OF PROSTATE;  Surgeon: Jerilee Field, MD;  Location: [facility];  Service: Urology;  Laterality: N/A;   HERNIA REPAIR  04/2018   hiatal   LEFT HEART CATH AND CORONARY ANGIOGRAPHY N/A 05/16/2022   Procedure: LEFT HEART CATH AND CORONARY ANGIOGRAPHY;  Surgeon: Elder Negus, MD;  Location: [facility];  Service: Cardiovascular;  Laterality: N/A;     reports that he has never smoked. He has never used smokeless tobacco. He reports current alcohol use of about 154.0 standard drinks of alcohol per week. He reports that he does not currently use drugs after having used the following drugs: Marijuana.  Family History  Problem Relation Age of Onset   Heart failure Mother    Breast cancer  Mother    Stroke Father    Other Father        Colon Issues   Lung disease Neg Hx      Physical Exam: Vitals:   07/07/23 2200 07/07/23 2215 07/07/23 2230 07/07/23 2336  BP: 137/80 (!) 148/82 (!) 141/94 (!) 142/92  Pulse: (!) 114 (!) 111 (!) 111 (!) 123  Resp: 16 17 15 16   Temp:    98 F (36.7 C)  TempSrc:    Oral  SpO2: 92% 96% 98% 94%  Weight:    97.5 kg  Height:    5\' 11"  (1.803 m)    Gen: Awake, alert,  chronically ill-appearing CV: Regular, tachycardic, normal S1, S2, no murmurs  Resp: Normal WOB, CTAB  Abd: Flat, normoactive,  mild epigastric tenderness MSK: Symmetric, no edema  Skin: No rashes or lesions to exposed skin  Neuro: Alert and interactive, jaw and extremity tremor Psych: Anxious, depressed, no SI   Data review:   Labs reviewed, notable for:   Bicarb 12, anion gap 23 VBG 7.3/38, repeat bicarb pending Lactate 1.6 UA with few bacteria, positive leukocyte, negative nitrite, greater than 50 WBC Alcohol positive  Micro:  Results for orders placed or performed during the hospital encounter of 05/27/23  Resp panel by RT-PCR (RSV, Flu A&B, Covid) Anterior Nasal Swab     Status: None   Collection Time: 05/27/23 11:24 PM   Specimen: Anterior Nasal Swab  Result Value Ref Range Status   SARS Coronavirus 2 by RT PCR NEGATIVE NEGATIVE Final   Influenza A by PCR NEGATIVE NEGATIVE Final   Influenza B by PCR NEGATIVE NEGATIVE Final    Comment: (NOTE) The Xpert Xpress SARS-CoV-2/FLU/RSV plus assay is intended as an aid in the diagnosis of influenza from Nasopharyngeal swab specimens and should not be used as a sole basis for treatment. Nasal washings and aspirates are unacceptable for Xpert Xpress SARS-CoV-2/FLU/RSV testing.  Fact Sheet for Patients: BloggerCourse.com  Fact Sheet for Healthcare Providers: SeriousBroker.it  This test is not yet approved or cleared by the [place] FDA and has been  authorized for detection and/or diagnosis of SARS-CoV-2 by FDA under an Emergency Use Authorization (EUA). This EUA will remain in effect (meaning this test can be used) for the duration of the COVID-19 declaration under Section 564(b)(1) of the Act, 21 U.S.C. section 360bbb-3(b)(1), unless the authorization is terminated or revoked.     Resp Syncytial Virus by PCR NEGATIVE NEGATIVE Final    Comment: (NOTE) Fact Sheet for Patients: BloggerCourse.com  Fact Sheet for Healthcare Providers: SeriousBroker.it  This test is not yet approved or cleared by the [place] FDA and has been authorized for detection and/or diagnosis of SARS-CoV-2 by FDA under an Emergency Use Authorization (EUA). This EUA will remain in effect (meaning this test can be used) for the duration of the COVID-19 declaration under Section 564(b)(1) of the Act, 21 U.S.C. section 360bbb-3(b)(1), unless the authorization is terminated or revoked.  Performed at [facility], [address] [place], [place] [address]     Imaging reviewed:  CT Head Wo Contrast Result Date: 07/07/2023 CLINICAL DATA:  Mental status change, unknown cause; Neck trauma (Age >= 65y). EXAM: CT HEAD WITHOUT CONTRAST CT CERVICAL SPINE WITHOUT CONTRAST TECHNIQUE: Multidetector CT imaging of the head and cervical spine was performed following the standard protocol without intravenous contrast. Multiplanar CT image reconstructions of the cervical spine were also generated. RADIATION DOSE REDUCTION: This exam was performed according to the departmental dose-optimization program which includes automated exposure control, adjustment of the mA and/or kV according to patient size and/or use of iterative reconstruction technique. COMPARISON:  CT head and cervical spine 02/07/2023. MRI head 02/03/2023. FINDINGS: CT HEAD FINDINGS Brain: There is no evidence of an acute infarct, intracranial hemorrhage,  mass, midline shift, or extra-axial fluid collection. Mild cerebral atrophy is within normal limits for age. Vascular: Calcified atherosclerosis at the skull base. No hyperdense vessel. Skull: No acute fracture or suspicious lesion. Sinuses/Orbits: Unchanged mucous retention cyst in the right frontal sinus. Clear mastoid air cells. Unremarkable orbits. Other: None. CT CERVICAL SPINE FINDINGS Alignment: Cervical spine straightening.  No significant listhesis. Skull base and vertebrae: No acute fracture or suspicious lesion. Soft tissues and spinal canal: No prevertebral fluid or swelling.  No visible canal hematoma. Disc levels: Moderately advanced disc degeneration at C5-6 and C6-7 with broad-based posterior disc osteophyte complexes and ossification of the posterior longitudinal ligament resulting in mild-to-moderate spinal stenosis at both levels. Advanced upper cervical facet arthrosis. Partial facet ankylosis at C2-3. Upper chest: The included lung apices are clear. Other: None. IMPRESSION: 1. No evidence of acute intracranial abnormality or cervical spine fracture. 2. Cervical disc and facet degeneration with mild-to-moderate spinal stenosis at C5-6 and C6-7. Electronically Signed   By: Sebastian Ache M.D.   On: 07/07/2023 18:53   CT Cervical Spine Wo Contrast Result Date: 07/07/2023 CLINICAL DATA:  Mental status change, unknown cause; Neck trauma (Age >= 65y). EXAM: CT HEAD WITHOUT CONTRAST CT CERVICAL SPINE WITHOUT CONTRAST TECHNIQUE: Multidetector CT imaging of the head and cervical spine was performed following the standard protocol without intravenous contrast. Multiplanar CT image reconstructions of the cervical spine were also generated. RADIATION DOSE REDUCTION: This exam was performed according to the departmental dose-optimization program which includes automated exposure control, adjustment of the mA and/or kV according to patient size and/or use of iterative reconstruction technique. COMPARISON:  CT  head and cervical spine 02/07/2023. MRI head 02/03/2023. FINDINGS: CT HEAD FINDINGS Brain: There is no evidence of an acute infarct, intracranial hemorrhage, mass, midline shift, or extra-axial fluid collection. Mild cerebral atrophy is within normal limits for age. Vascular: Calcified atherosclerosis at the skull base. No hyperdense vessel. Skull: No acute fracture or suspicious lesion. Sinuses/Orbits: Unchanged mucous retention cyst in the right frontal sinus. Clear mastoid air cells. Unremarkable orbits. Other: None. CT CERVICAL SPINE FINDINGS Alignment: Cervical spine straightening.  No significant listhesis. Skull base and vertebrae: No acute fracture or suspicious lesion. Soft tissues and spinal canal: No prevertebral fluid or swelling. No visible canal hematoma. Disc levels: Moderately advanced disc degeneration at C5-6 and C6-7 with broad-based posterior disc osteophyte complexes and ossification of the posterior longitudinal ligament resulting in mild-to-moderate spinal stenosis at both levels. Advanced upper cervical facet arthrosis. Partial facet ankylosis at C2-3. Upper chest: The included lung apices are clear. Other: None. IMPRESSION: 1. No evidence of acute intracranial abnormality or cervical spine fracture. 2. Cervical disc and facet degeneration with mild-to-moderate spinal stenosis at C5-6 and C6-7. Electronically Signed   By: Sebastian Ache M.D.   On: 07/07/2023 18:53   CT ABDOMEN PELVIS W CONTRAST Result Date: 07/07/2023 CLINICAL DATA:  Nausea.  Acute generalized abdominal pain. EXAM: CT ABDOMEN AND PELVIS WITH CONTRAST TECHNIQUE: Multidetector CT imaging of the abdomen and pelvis was performed using the standard protocol following bolus administration of intravenous contrast. RADIATION DOSE REDUCTION: This exam was performed according to the departmental dose-optimization program which includes automated exposure control, adjustment of the mA and/or kV according to patient size and/or use of  iterative reconstruction technique. CONTRAST:  OMNIPAQUE IOHEXOL 300 MG/ML  SOLN COMPARISON:  December 17, 2022. FINDINGS: Lower chest: Minimal right basilar subsegmental atelectasis. Hepatobiliary: No cholelithiasis or biliary dilatation is noted. Hepatic steatosis. Pancreas: Unremarkable. No pancreatic ductal dilatation or surrounding inflammatory changes. Spleen: Normal in size without focal abnormality. Adrenals/Urinary Tract: Adrenal glands are unremarkable. Kidneys are normal, without renal calculi, focal lesion, or hydronephrosis. Bladder is unremarkable. Stomach/Bowel: Stable findings consistent with prior hiatal hernia repair. There is no evidence of bowel obstruction or inflammation. The appendix appears normal. Vascular/Lymphatic: Aortic atherosclerosis. No enlarged abdominal or pelvic lymph nodes. Reproductive: Stable mild prostatic enlargement. Other: No ascites or hernia. Musculoskeletal: No acute or significant osseous findings. IMPRESSION: Hepatic steatosis. Stable  mild prostatic enlargement. Status post prior hiatal hernia repair. Aortic Atherosclerosis (ICD10-I70.0). Electronically Signed   By: Lupita Raider M.D.   On: 07/07/2023 18:44   DG Chest Port 1 View Result Date: 07/07/2023 CLINICAL DATA:  Altered mental status.  Alcohol consumption. EXAM: PORTABLE CHEST 1 VIEW COMPARISON:  Radiographs 05/27/2023 and 04/11/2023.  CT 12/17/2022. FINDINGS: 1727 hours. Incomplete visualization of the left costophrenic angle on this portable examination. The heart size is stable at the upper limits of normal. The overall aeration of the left lung base appears improved from the most recent prior study. The right lung is clear. No evidence of pneumothorax or significant pleural effusion. The bones appear unchanged. Telemetry leads overlie the chest. IMPRESSION: Improved aeration of the left lung base compared with recent prior study. No new findings. Electronically Signed   By: Carey Bullocks M.D.    On: 07/07/2023 17:42     ED Course:  at [facility] ED treated with Ativan, 2 L IV fluid, thiamine, multivitamin.   Assessment/Plan:  73 y.o. male with hx alcohol use disorder, hypertension, hyperlipidemia, BPH, mood disorder, who was transferred from [facility] ED for alcohol withdrawal.  Alcohol withdrawal, moderate to severe Alcohol use disorder Heavy alcohol use with 700 mL of liquor per day, last drink 3/29.  No history of seizure/DT in the past. On initial evaluation tachycardic into the 130s. Positive alcohol level despite developing withdrawal.  -Start scheduled Librium taper 50 mg q6-> q8-> q12 -> 25 mg q12 then off  -CIWA with Ativan as needed in addition -He is interested in medication assisted treatment for alcohol cessation.  Due to frequent dosing of acamprosate would consider him for restarting naltrexone 50 mg daily. LFT wnl.  -Thiamine, multivitamin, folate -TOC for substance abuse resources.  Alcoholic /starvation ketoacidosis Bicarb 12 with anion gap 23, improving to 16 with anion gap of 14 after IV fluids.  UA with ketones.  Lactate is within normal limits. -S/p 2 L IV fluid and thiamine per above -If remains with acidosis and unable to tolerate p.o.'s can give D5 half-normal maintenance fluids  Possible UTI Reports lower urinary tract symptoms although may be related to BPH with UA questionable for UTI. - Obtain urine culture. - Start ceftriaxone 1 g IV every 24 hours  Chronic medical problems: Hypertension: Continue home clonidine 0.1 mg twice daily, metoprolol 50 mg twice daily. Hyperlipidemia: Continue home rosuvastatin 3 times weekly  BPH: Continue home finasteride, tamsulosin.  Outpatient urology follow-up. Mood disorder: Continue home Seroquel 300 mg nightly, hydroxyzine prn.  Recommend outpatient psychiatry follow-up    Body mass index is 29.98 kg/m.    DVT prophylaxis:  Lovenox Code Status:  Full Code Diet:  Diet Orders (From admission, onward)     Start      Ordered   07/08/23 0014  Diet regular Room service appropriate? Yes; Fluid consistency: Thin  Diet effective now       Question Answer Comment  Room service appropriate? Yes   Fluid consistency: Thin      07/08/23 0017           Family Communication: No Consults: None Admission status:   Inpatient, Telemetry bed  Severity of Illness: The appropriate patient status for this patient is INPATIENT. Inpatient status is judged to be reasonable and necessary in order to provide the required intensity of service to ensure the patient's safety. The patient's presenting symptoms, physical exam findings, and initial radiographic and laboratory data in the context of their chronic comorbidities  is felt to place them at high risk for further clinical deterioration. Furthermore, it is not anticipated that the patient will be medically stable for discharge from the hospital within 2 midnights of admission.   * I certify that at the point of admission it is my clinical judgment that the patient will require inpatient hospital care spanning beyond 2 midnights from the point of admission due to high intensity of service, high risk for further deterioration and high frequency of surveillance required.*   Dolly Rias, MD [facility]  How to contact the [facility] Attending or Consulting provider 7A - 7P or covering provider during after hours 7P -7A, for this patient.  Check the care team in [facility] and look for a) attending/consulting [facility] provider listed and b) the [facility] team listed Log into [email or web address] and use [facility] universal password to access. If you do not have the password, please contact the hospital operator. Locate the [facility] provider you are looking for under [facility] and page to a number that you can be directly reached. If you still have difficulty reaching the provider, please page the [facility] (Director on Call) for the Hospitalists listed on amion for assistance.  07/08/2023,  2:08 AM

## 2023-07-08 NOTE — Progress Notes (Signed)
 PROGRESS NOTE    Ruben [name]  ZOX:[id number] DOB: March 20, 1951 DOA: 07/07/2023 PCP: Soundra Pilon, FNP   Brief Narrative:  Ruben [name] is a 73 y.o. male with hx of alcohol use disorder, hypertension, hyperlipidemia, BPH, mood disorder,  who was  was brought in by EMS after neighbors called for wellness check because they had not seen him moving around the house as he normally does.  Patient acknowledges heavy alcohol use ever since retiring in 2020.  Drinking approximately 700 mL of liquor per day.  No history of seizure or DTs per his knowledge.  He is interested in quitting drinking.  He feels depressed but has no suicidal ideation.  Has tried acamprosate but not currently taking.  Also previously on naltrexone but states he "did not give it a good go".  Otherwise has nausea but no vomiting.  No other recent illness.  Very bothered by BPH symptoms.  Was followed by urology outpatient.  Assessment & Plan:   Principal Problem:   Alcohol withdrawal (HCC) Active Problems:   Alcoholic ketoacidosis   UTI (urinary tract infection)  Alcohol use disorder/alcohol withdrawal: Admits to drinking around 700 mL of liquor each day, last drink on 07/07/2023.  EtOH 215 upon arrival.  Will continue Librium tapering protocol along with as needed Ativan, multivitamin and folate.  Alcoholic/high anion gap metabolic and starvation ketosis/dehydration: Bicarb 12 upon arrival which has improved to 16.  Will start him on gentle hydration for another 24 hours.  Repeat labs in the morning.  UTI ruled out: Patient's symptoms are more consistent with BPH.  No dysuria.  UA is not impressive for UTI.  Will discontinue Rocephin.  Hypophosphatemia: Will replenish.  Essential hypertension: Controlled.  Continue home medications which is clonidine 0.1 mg twice daily and metoprolol 50 mg twice daily.  Hyperlipidemia: Continue rosuvastatin.  BPH: Continue Flomax and finasteride and follow-up with urology as  outpatient.  Mood disorder: Continue home dose of Seroquel and follow-up with psychiatry as outpatient.  Does not have any suicidal thoughts or plans.  DVT prophylaxis: enoxaparin (LOVENOX) injection 40 mg Start: 07/08/23 1000   Code Status: Full Code  Family Communication:  None present at bedside.  Plan of care discussed with patient in length and he/she verbalized understanding and agreed with it.  Status is: Inpatient Remains inpatient appropriate because: Needs inpatient management of alcohol withdrawal.   Estimated body mass index is 29.98 kg/m as calculated from the following:   Height as of this encounter: 5\' 11"  (1.803 m).   Weight as of this encounter: 97.5 kg.    Nutritional Assessment: Body mass index is 29.98 kg/m.Marland Kitchen Seen by dietician.  I agree with the assessment and plan as outlined below: Nutrition Status:        . Skin Assessment: I have examined the patient's skin and I agree with the wound assessment as performed by the wound care RN as outlined below:    Consultants:  None  Procedures:  None  Antimicrobials:  Anti-infectives (From admission, onward)    Start     Dose/Rate Route Frequency Ordered Stop   07/08/23 0600  cefTRIAXone (ROCEPHIN) 1 g in sodium chloride 0.9 % 100 mL IVPB  Status:  Discontinued        1 g 200 mL/hr over 30 Minutes Intravenous Every 24 hours 07/08/23 0227 07/08/23 8119         Subjective: Patient seen and examined.  He says that he feels better other than just generalized weakness.  Complains of some anxiety but no palpitation, chest pain or shortness of breath.  He is currently alert and oriented.  Objective: Vitals:   07/07/23 2230 07/07/23 2336 07/08/23 0551 07/08/23 0746  BP: (!) 141/94 (!) 142/92 110/67 110/68  Pulse: (!) 111 (!) 123 84 80  Resp: 15 16 19 18   Temp:  98 F (36.7 C) (!) 97.3 F (36.3 C) 98.9 F (37.2 C)  TempSrc:  Oral Oral   SpO2: 98% 94% 94% 94%  Weight:  97.5 kg    Height:  5\' 11"   (1.803 m)      Intake/Output Summary (Last 24 hours) at 07/08/2023 0904 Last data filed at 07/08/2023 0600 Gross per 24 hour  Intake 2177.11 ml  Output 600 ml  Net 1577.11 ml   Filed Weights   07/07/23 1552 07/07/23 2336  Weight: 97 kg 97.5 kg    Examination:  General exam: Appears calm and comfortable, very mild fine upper extremity tremors Respiratory system: Clear to auscultation. Respiratory effort normal. Cardiovascular system: S1 & S2 heard, RRR. No JVD, murmurs, rubs, gallops or clicks. No pedal edema. Gastrointestinal system: Abdomen is nondistended, soft and nontender. No organomegaly or masses felt. Normal bowel sounds heard. Central nervous system: Alert and oriented. No focal neurological deficits. Extremities: Symmetric 5 x 5 power. Skin: No rashes, lesions or ulcers Psychiatry: Judgement and insight appear normal. Mood & affect appropriate.    Data Reviewed: I have personally reviewed following labs and imaging studies  CBC: Recent Labs  Lab 07/07/23 1626 07/08/23 0132  WBC 9.2 6.2  HGB 16.4 15.4  HCT 48.7 46.2  MCV 85.7 85.4  PLT 272 224   Basic Metabolic Panel: Recent Labs  Lab 07/07/23 1626 07/08/23 0132  NA 137 136  K 4.5 4.4  CL 102 106  CO2 12* 16*  GLUCOSE 86 110*  BUN 18 13  CREATININE 1.11 1.05  CALCIUM 8.6* 8.4*  MG 1.9 1.9  PHOS  --  2.0*   GFR: Estimated Creatinine Clearance: 76.8 mL/min (by C-G formula based on SCr of 1.05 mg/dL). Liver Function Tests: Recent Labs  Lab 07/07/23 1626  AST 30  ALT 27  ALKPHOS 78  BILITOT 0.8  PROT 8.1  ALBUMIN 4.0   Recent Labs  Lab 07/07/23 1626  LIPASE 23   No results for input(s): "AMMONIA" in the last 168 hours. Coagulation Profile: No results for input(s): "INR", "PROTIME" in the last 168 hours. Cardiac Enzymes: No results for input(s): "CKTOTAL", "CKMB", "CKMBINDEX", "TROPONINI" in the last 168 hours. BNP (last 3 results) No results for input(s): "PROBNP" in the last 8760  hours. HbA1C: No results for input(s): "HGBA1C" in the last 72 hours. CBG: No results for input(s): "GLUCAP" in the last 168 hours. Lipid Profile: No results for input(s): "CHOL", "HDL", "LDLCALC", "TRIG", "CHOLHDL", "LDLDIRECT" in the last 72 hours. Thyroid Function Tests: No results for input(s): "TSH", "T4TOTAL", "FREET4", "T3FREE", "THYROIDAB" in the last 72 hours. Anemia Panel: No results for input(s): "VITAMINB12", "FOLATE", "FERRITIN", "TIBC", "IRON", "RETICCTPCT" in the last 72 hours. Sepsis Labs: Recent Labs  Lab 07/08/23 0132  LATICACIDVEN 1.6    No results found for this or any previous visit (from the past 240 hours).   Radiology Studies: CT Head Wo Contrast Result Date: 07/07/2023 CLINICAL DATA:  Mental status change, unknown cause; Neck trauma (Age >= 65y). EXAM: CT HEAD WITHOUT CONTRAST CT CERVICAL SPINE WITHOUT CONTRAST TECHNIQUE: Multidetector CT imaging of the head and cervical spine was performed following the standard protocol  without intravenous contrast. Multiplanar CT image reconstructions of the cervical spine were also generated. RADIATION DOSE REDUCTION: This exam was performed according to the departmental dose-optimization program which includes automated exposure control, adjustment of the mA and/or kV according to patient size and/or use of iterative reconstruction technique. COMPARISON:  CT head and cervical spine 02/07/2023. MRI head 02/03/2023. FINDINGS: CT HEAD FINDINGS Brain: There is no evidence of an acute infarct, intracranial hemorrhage, mass, midline shift, or extra-axial fluid collection. Mild cerebral atrophy is within normal limits for age. Vascular: Calcified atherosclerosis at the skull base. No hyperdense vessel. Skull: No acute fracture or suspicious lesion. Sinuses/Orbits: Unchanged mucous retention cyst in the right frontal sinus. Clear mastoid air cells. Unremarkable orbits. Other: None. CT CERVICAL SPINE FINDINGS Alignment: Cervical spine  straightening.  No significant listhesis. Skull base and vertebrae: No acute fracture or suspicious lesion. Soft tissues and spinal canal: No prevertebral fluid or swelling. No visible canal hematoma. Disc levels: Moderately advanced disc degeneration at C5-6 and C6-7 with broad-based posterior disc osteophyte complexes and ossification of the posterior longitudinal ligament resulting in mild-to-moderate spinal stenosis at both levels. Advanced upper cervical facet arthrosis. Partial facet ankylosis at C2-3. Upper chest: The included lung apices are clear. Other: None. IMPRESSION: 1. No evidence of acute intracranial abnormality or cervical spine fracture. 2. Cervical disc and facet degeneration with mild-to-moderate spinal stenosis at C5-6 and C6-7. Electronically Signed   By: Sebastian Ache M.D.   On: 07/07/2023 18:53   CT Cervical Spine Wo Contrast Result Date: 07/07/2023 CLINICAL DATA:  Mental status change, unknown cause; Neck trauma (Age >= 65y). EXAM: CT HEAD WITHOUT CONTRAST CT CERVICAL SPINE WITHOUT CONTRAST TECHNIQUE: Multidetector CT imaging of the head and cervical spine was performed following the standard protocol without intravenous contrast. Multiplanar CT image reconstructions of the cervical spine were also generated. RADIATION DOSE REDUCTION: This exam was performed according to the departmental dose-optimization program which includes automated exposure control, adjustment of the mA and/or kV according to patient size and/or use of iterative reconstruction technique. COMPARISON:  CT head and cervical spine 02/07/2023. MRI head 02/03/2023. FINDINGS: CT HEAD FINDINGS Brain: There is no evidence of an acute infarct, intracranial hemorrhage, mass, midline shift, or extra-axial fluid collection. Mild cerebral atrophy is within normal limits for age. Vascular: Calcified atherosclerosis at the skull base. No hyperdense vessel. Skull: No acute fracture or suspicious lesion. Sinuses/Orbits: Unchanged  mucous retention cyst in the right frontal sinus. Clear mastoid air cells. Unremarkable orbits. Other: None. CT CERVICAL SPINE FINDINGS Alignment: Cervical spine straightening.  No significant listhesis. Skull base and vertebrae: No acute fracture or suspicious lesion. Soft tissues and spinal canal: No prevertebral fluid or swelling. No visible canal hematoma. Disc levels: Moderately advanced disc degeneration at C5-6 and C6-7 with broad-based posterior disc osteophyte complexes and ossification of the posterior longitudinal ligament resulting in mild-to-moderate spinal stenosis at both levels. Advanced upper cervical facet arthrosis. Partial facet ankylosis at C2-3. Upper chest: The included lung apices are clear. Other: None. IMPRESSION: 1. No evidence of acute intracranial abnormality or cervical spine fracture. 2. Cervical disc and facet degeneration with mild-to-moderate spinal stenosis at C5-6 and C6-7. Electronically Signed   By: Sebastian Ache M.D.   On: 07/07/2023 18:53   CT ABDOMEN PELVIS W CONTRAST Result Date: 07/07/2023 CLINICAL DATA:  Nausea.  Acute generalized abdominal pain. EXAM: CT ABDOMEN AND PELVIS WITH CONTRAST TECHNIQUE: Multidetector CT imaging of the abdomen and pelvis was performed using the standard protocol following bolus administration of  intravenous contrast. RADIATION DOSE REDUCTION: This exam was performed according to the departmental dose-optimization program which includes automated exposure control, adjustment of the mA and/or kV according to patient size and/or use of iterative reconstruction technique. CONTRAST:  OMNIPAQUE IOHEXOL 300 MG/ML  SOLN COMPARISON:  December 17, 2022. FINDINGS: Lower chest: Minimal right basilar subsegmental atelectasis. Hepatobiliary: No cholelithiasis or biliary dilatation is noted. Hepatic steatosis. Pancreas: Unremarkable. No pancreatic ductal dilatation or surrounding inflammatory changes. Spleen: Normal in size without focal abnormality.  Adrenals/Urinary Tract: Adrenal glands are unremarkable. Kidneys are normal, without renal calculi, focal lesion, or hydronephrosis. Bladder is unremarkable. Stomach/Bowel: Stable findings consistent with prior hiatal hernia repair. There is no evidence of bowel obstruction or inflammation. The appendix appears normal. Vascular/Lymphatic: Aortic atherosclerosis. No enlarged abdominal or pelvic lymph nodes. Reproductive: Stable mild prostatic enlargement. Other: No ascites or hernia. Musculoskeletal: No acute or significant osseous findings. IMPRESSION: Hepatic steatosis. Stable mild prostatic enlargement. Status post prior hiatal hernia repair. Aortic Atherosclerosis (ICD10-I70.0). Electronically Signed   By: Lupita Raider M.D.   On: 07/07/2023 18:44   DG Chest Port 1 View Result Date: 07/07/2023 CLINICAL DATA:  Altered mental status.  Alcohol consumption. EXAM: PORTABLE CHEST 1 VIEW COMPARISON:  Radiographs 05/27/2023 and 04/11/2023.  CT 12/17/2022. FINDINGS: 1727 hours. Incomplete visualization of the left costophrenic angle on this portable examination. The heart size is stable at the upper limits of normal. The overall aeration of the left lung base appears improved from the most recent prior study. The right lung is clear. No evidence of pneumothorax or significant pleural effusion. The bones appear unchanged. Telemetry leads overlie the chest. IMPRESSION: Improved aeration of the left lung base compared with recent prior study. No new findings. Electronically Signed   By: Carey Bullocks M.D.   On: 07/07/2023 17:42    Scheduled Meds:  chlordiazePOXIDE  50 mg Oral Q6H   Followed by   Melene Muller ON 07/09/2023] chlordiazePOXIDE  50 mg Oral Q8H   Followed by   Melene Muller ON 07/10/2023] chlordiazePOXIDE  50 mg Oral Q12H   Followed by   Melene Muller ON 07/11/2023] chlordiazePOXIDE  25 mg Oral Q12H   cloNIDine  0.1 mg Oral BID   enoxaparin (LOVENOX) injection  40 mg Subcutaneous Q24H   finasteride  5 mg Oral Daily    folic acid  1 mg Oral Daily   gabapentin  300 mg Oral BID   metoprolol tartrate  50 mg Oral BID   multivitamin with minerals  1 tablet Oral Daily   QUEtiapine  300 mg Oral QHS   [START ON 07/09/2023] rosuvastatin  20 mg Oral Q M,W,F   sodium chloride flush  3 mL Intravenous Q12H   tamsulosin  0.4 mg Oral QHS   thiamine  100 mg Oral Daily   Or   thiamine  100 mg Intravenous Daily   Continuous Infusions:  sodium PHOSPHATE IVPB (in mmol) 15 mmol (07/08/23 0409)     LOS: 0 days   Hughie Closs, MD [facility]  07/08/2023, 9:04 AM   *Please note that this is a verbal dictation therefore any spelling or grammatical errors are due to the "Dragon [employer] One" system interpretation.  Please page via Amion and do not message via secure chat for urgent patient care matters. Secure chat can be used for non urgent patient care matters.  How to contact the [facility] Attending or Consulting provider 7A - 7P or covering provider during after hours 7P -7A, for this patient?  Check the  care team in [facility] and look for a) attending/consulting [facility] provider listed and b) the [facility] team listed. Page or secure chat 7A-7P. Log into [email or web address] and use [place] universal password to access. If you do not have the password, please contact the hospital operator. Locate the [facility] provider you are looking for under [facility] and page to a number that you can be directly reached. If you still have difficulty reaching the provider, please page the [facility] (Director on Call) for the Hospitalists listed on amion for assistance.

## 2023-07-08 NOTE — Plan of Care (Signed)

## 2023-07-09 DIAGNOSIS — F10930 Alcohol use, unspecified with withdrawal, uncomplicated: Secondary | ICD-10-CM | POA: Diagnosis not present

## 2023-07-09 LAB — COMPREHENSIVE METABOLIC PANEL WITH GFR
ALT: 21 U/L (ref 0–44)
AST: 22 U/L (ref 15–41)
Albumin: 2.9 g/dL — ABNORMAL LOW (ref 3.5–5.0)
Alkaline Phosphatase: 58 U/L (ref 38–126)
Anion gap: 10 (ref 5–15)
BUN: 12 mg/dL (ref 8–23)
CO2: 22 mmol/L (ref 22–32)
Calcium: 8.4 mg/dL — ABNORMAL LOW (ref 8.9–10.3)
Chloride: 106 mmol/L (ref 98–111)
Creatinine, Ser: 1.08 mg/dL (ref 0.61–1.24)
GFR, Estimated: >60 mL/min (ref >60)
Glucose, Bld: 152 mg/dL — ABNORMAL HIGH (ref 70–99)
Potassium: 3.8 mmol/L (ref 3.5–5.1)
Sodium: 138 mmol/L (ref 135–145)
Total Bilirubin: 0.9 mg/dL (ref 0.0–1.2)
Total Protein: 6 g/dL — ABNORMAL LOW (ref 6.5–8.1)

## 2023-07-09 NOTE — Plan of Care (Signed)

## 2023-07-09 NOTE — Evaluation (Signed)
 Physical Therapy Evaluation Patient Details Name: Ruben [name] MRN: [id number] DOB: June 17, 1950 Today's Date: 07/09/2023  History of Present Illness  73 y.o. male admitted 3/29 with Alcohol use disorder/alcohol withdrawal, Alcoholic/high anion gap metabolic and starvation ketosis/dehydration. PMHx: alcohol use disorder, hypertension, hyperlipidemia, BPH, mood disorder.  Clinical Impression  Pt admitted with above diagnosis. Feels close to baseline which is independent PTA. Ambulates with supervision, minor instability but able to self correct without an assistive device. DGI score 21/24 indicating low fall risk today. Tolerated activities well. Some minor deficits with insight. Will continue to follow and progress during admission. Pt currently with functional limitations due to the deficits listed below (see PT Problem List). Pt will benefit from acute skilled PT to increase their independence and safety with mobility to allow discharge.           If plan is discharge home, recommend the following: Supervision due to cognitive status;Help with stairs or ramp for entrance;Assist for transportation   Can travel by private vehicle        Equipment Recommendations None recommended by PT  Recommendations for Other Services       Functional Status Assessment Patient has had a recent decline in their functional status and demonstrates the ability to make significant improvements in function in a reasonable and predictable amount of time.     Precautions / Restrictions Precautions Precautions: Fall Recall of Precautions/Restrictions: Intact Restrictions Weight Bearing Restrictions Per Provider Order: No      Mobility  Bed Mobility Overal bed mobility: Modified Independent             General bed mobility comments: no assist    Transfers Overall transfer level: Modified independent Equipment used: None               General transfer comment: Stands without support.  Sits somewhat uncontrolled on bed. Performed multiple times.    Ambulation/Gait Ambulation/Gait assistance: Supervision Gait Distance (Feet): 115 Feet Assistive device: None Gait Pattern/deviations: Step-through pattern, Wide base of support, Drifts right/left Gait velocity: dec Gait velocity interpretation: 1.31 - 2.62 ft/sec, indicative of limited community ambulator   General Gait Details: Minor drift without loss of balance today while challenged by dynamic tasks. Mild instability but able to self correct . No buckling. feels close to baseline.  Stairs            Wheelchair Mobility     Tilt Bed    Modified Rankin (Stroke Patients Only)       Balance Overall balance assessment: Modified Independent                               Standardized Balance Assessment Standardized Balance Assessment : Dynamic Gait Index   Dynamic Gait Index Level Surface: Normal Change in Gait Speed: Normal Gait with Horizontal Head Turns: Normal Gait with Vertical Head Turns: Mild Impairment Gait and Pivot Turn: Mild Impairment Step Over Obstacle: Normal Step Around Obstacles: Normal Steps: Mild Impairment Total Score: 21       Pertinent Vitals/Pain Pain Assessment Pain Assessment: No/denies pain    Home Living Family/patient expects to be discharged to:: Private residence Living Arrangements: Other relatives Available Help at Discharge: Family;Available 24 hours/day Type of Home: House Home Access: Stairs to enter Entrance Stairs-Rails: None Entrance Stairs-Number of Steps: 1   Home Layout: One level Home Equipment: Rollator (4 wheels) Additional Comments: Plans to stay with his cousin Candy at discharge; in  [place].    Prior Function Prior Level of Function : Independent/Modified Independent             Mobility Comments: reports ind at baseline. denies falls ADLs Comments: States ind.     Extremity/Trunk Assessment   Upper Extremity  Assessment Upper Extremity Assessment: Defer to OT evaluation    Lower Extremity Assessment Lower Extremity Assessment: Overall WFL for tasks assessed       Communication   Communication Communication: No apparent difficulties    Cognition Arousal: Alert Behavior During Therapy: WFL for tasks assessed/performed   PT - Cognitive impairments: No family/caregiver present to determine baseline                         Following commands: Intact       Cueing Cueing Techniques: Verbal cues     General Comments General comments (skin integrity, edema, etc.): Reviewed findings, pt requests i update his cousin Drue Flirt - provided phone number. Will call before end of day.    Exercises     Assessment/Plan    PT Assessment Patient needs continued PT services  PT Problem List Decreased activity tolerance;Decreased balance;Decreased mobility;Decreased cognition;Decreased knowledge of use of DME       PT Treatment Interventions DME instruction;Gait training;Stair training;Functional mobility training;Therapeutic activities;Therapeutic exercise;Balance training;Neuromuscular re-education;Cognitive remediation;Patient/family education    PT Goals (Current goals can be found in the Care Plan section)  Acute Rehab PT Goals Patient Stated Goal: Get out of hospital, stay with cousin PT Goal Formulation: With patient Time For Goal Achievement: 07/23/23 Potential to Achieve Goals: Good    Frequency Min 2X/week     Co-evaluation               AM-PAC PT "6 Clicks" Mobility  Outcome Measure Help needed turning from your back to your side while in a flat bed without using bedrails?: None Help needed moving from lying on your back to sitting on the side of a flat bed without using bedrails?: None Help needed moving to and from a bed to a chair (including a wheelchair)?: None Help needed standing up from a chair using your arms (e.g., wheelchair or bedside chair)?:  None Help needed to walk in hospital room?: A Little Help needed climbing 3-5 steps with a railing? : A Little 6 Click Score: 22    End of Session Equipment Utilized During Treatment: Gait belt Activity Tolerance: Patient tolerated treatment well Patient left: in bed;with call bell/phone within reach;with bed alarm set   PT Visit Diagnosis: Other abnormalities of gait and mobility (R26.89);Other symptoms and signs involving the nervous system (R29.898)    Time: 9562-1308 PT Time Calculation (min) (ACUTE ONLY): 16 min   Charges:   PT Evaluation $PT Eval Low Complexity: 1 Low   PT General Charges $$ ACUTE PT VISIT: 1 Visit         Kathlyn Sacramento, PT, DPT [facility]  Rehabilitation Services Physical Therapist Office: [phone] Website: [email or web address]   Berton Mount 07/09/2023, 5:41 PM

## 2023-07-09 NOTE — Plan of Care (Signed)

## 2023-07-09 NOTE — TOC Initial Note (Signed)
 Transition of Care [facility]) - Initial/Assessment Note    Patient Details  Name: Ruben [name] MRN: [id number] Date of Birth: 15-Oct-1950  Transition of [facility]) CM/SW Contact:    Bridney Guadarrama A [place], LCSW Phone Number: 07/09/2023, 3:47 PM  Clinical Narrative:                  CSW met with pt at bedside. He stated that he was struggling with finding community connection since he's been retired and has hx of alcohol use. He said that he would be open to CSW providing resources, CSW provided information for [employer] [facility] [place].   He said that he had follow up scheduled with outpatient community mental health, CSW unable to find on pt's chart.   CSW reached out to pt's cousin, Ruben [name]. Provided information for outpatient follow up, CSW added to pt's AVS. She also reports pt has been to rehab numerous times in the past, says pt has challenges with sobriety. Provided resources for AA meetings, as pt has been has utilized in the past had positive impact on pt's alcohol use.    TOC will continue to follow.   Expected Discharge Plan: Home/Self Care Barriers to Discharge: Continued Medical Work up   Patient Goals and CMS Choice            Expected Discharge Plan and Services                                              Prior Living Arrangements/Services                Care giver support system in place?: No (comment)      Activities of Daily Living   ADL Screening (condition at time of admission) Independently performs ADLs?: Yes (appropriate for developmental age) Is the patient deaf or have difficulty hearing?: No Does the patient have difficulty seeing, even when wearing glasses/contacts?: No Does the patient have difficulty concentrating, remembering, or making decisions?: No  Permission Sought/Granted                  Emotional Assessment Appearance:: Appears younger than stated age Attitude/Demeanor/Rapport: Engaged Affect (typically observed):  Calm, Appropriate Orientation: : Oriented to Self, Oriented to Place, Oriented to Situation Alcohol / Substance Use: Alcohol Use Psych Involvement: No (comment)  Admission diagnosis:  Alcohol withdrawal (HCC) [F10.939] Alcoholic ketoacidosis [E87.29] Alcoholic intoxication without complication (HCC) [F10.920] Patient Active Problem List   Diagnosis Date Noted   UTI (urinary tract infection) 07/08/2023   Alcohol dependence with withdrawal, unspecified (HCC) 04/11/2023   Depression    Alcoholic ketoacidosis 03/22/2023   Alcohol withdrawal hallucinosis (HCC) 03/22/2023   Severe alcohol withdrawal delirium (HCC) 02/27/2023   Lactic acid acidosis 02/26/2023   Encephalopathy acute 12/17/2022   Lactic acidosis 12/17/2022   Dehydration 12/17/2022   Acute lactic acidosis 12/17/2022   Abnormal stress test 05/11/2022   Elevated coronary artery calcium score 05/11/2022   Aortic atherosclerosis (HCC) 04/13/2022   Elevated LFTs    Acute kidney injury (HCC) 09/08/2020   Hyponatremia 09/08/2020   Hyperbilirubinemia 09/08/2020   Sinus tachycardia 08/16/2020   Palpitations 08/16/2020   Exertional dyspnea 08/16/2020   Mild renal insufficiency 06/23/2020   Actinic keratosis 06/04/2020   Prediabetes 06/04/2020   MDD (major depressive disorder), recurrent episode, moderate (HCC) 06/01/2020   Weight loss 04/08/2020   Unsteady gait  04/08/2020   Insomnia 04/08/2020   Lump of skin of left upper extremity 04/08/2020   Irregular heart beat 04/08/2020   Pure hypercholesterolemia 04/08/2020   Alcohol use disorder, severe, dependence (HCC) 04/03/2020   Hyperlipidemia 04/03/2020   Suicidal ideation 04/03/2020   Metabolic acidosis 12/22/2019   Thrombocytopenia (HCC) 12/22/2019   ARF (acute renal failure) (HCC) 10/01/2019   Alcohol withdrawal (HCC) 10/01/2019   Normochromic normocytic anemia 10/01/2019   GERD (gastroesophageal reflux disease) 06/22/2019   BPH (benign prostatic hyperplasia) 06/22/2019    Essential hypertension 06/22/2019   AKI (acute kidney injury) (HCC)    Alcohol intoxication (HCC) 06/21/2019   Shoulder joint pain 03/31/2019   S/P Nissen fundoplication (without gastrostomy tube) procedure 04/26/2018   Rosacea 06/24/2011   PCP:  Soundra Pilon, FNP Pharmacy:   [id number] - [place], [place] - [address] [place] [address] Phone: [phone] Fax: [phone]     Social Drivers of Health (SDOH) Social History: SDOH Screenings   Food Insecurity: No Food Insecurity (07/07/2023)  Housing: Patient Declined (07/07/2023)  Transportation Needs: No Transportation Needs (07/07/2023)  Utilities: Not At Risk (07/07/2023)  Social Connections: Socially Isolated (07/08/2023)  Tobacco Use: Low Risk  (07/08/2023)   SDOH Interventions:     Readmission Risk Interventions    07/09/2023    3:45 PM 03/23/2023   10:01 AM 02/27/2023    4:15 PM  Readmission Risk Prevention Plan  Transportation Screening Complete Complete Complete  PCP or Specialist Appt within 3-5 Days  Complete Complete  HRI or Home Care Consult  Complete Complete  Social Work Consult for Recovery Care Planning/Counseling  Complete Complete  Palliative Care Screening  Not Applicable Not Applicable  Medication Review Oceanographer) Complete Complete Complete  PCP or Specialist appointment within 3-5 days of discharge Complete    HRI or Home Care Consult Complete    SW Recovery Care/Counseling Consult Complete    Palliative Care Screening Not Applicable    Skilled Nursing Facility Not Applicable

## 2023-07-09 NOTE — Progress Notes (Signed)
 PROGRESS NOTE    Ruben [name]  WUJ:[id number] DOB: May 17, 1950 DOA: 07/07/2023 PCP: Soundra Pilon, FNP   Brief Narrative:  Ruben [name] is a 73 y.o. male with hx of alcohol use disorder, hypertension, hyperlipidemia, BPH, mood disorder,  who was  was brought in by EMS after neighbors called for wellness check because they had not seen him moving around the house as he normally does.  Patient acknowledges heavy alcohol use ever since retiring in 2020.  Drinking approximately 700 mL of liquor per day.  No history of seizure or DTs per his knowledge.  He is interested in quitting drinking.  He feels depressed but has no suicidal ideation.  Has tried acamprosate but not currently taking.  Also previously on naltrexone but states he "did not give it a good go".  Otherwise has nausea but no vomiting.  No other recent illness.  Very bothered by BPH symptoms.  Was followed by urology outpatient.  Assessment & Plan:   Principal Problem:   Alcohol withdrawal (HCC) Active Problems:   Alcoholic ketoacidosis   UTI (urinary tract infection)  Alcohol use disorder/alcohol withdrawal: Admits to drinking around 700 mL of liquor each day, last drink on 07/07/2023.  EtOH 215 upon arrival.  CIWA under 5 and has not required any as needed Ativan last 24 hours.  Will continue Librium tapering protocol along with as needed Ativan, multivitamin and folate.  Alcoholic/high anion gap metabolic and starvation ketosis/dehydration: Bicarb 12 upon arrival which has improved to 16.  Continue with hydration.  Awaiting labs this morning.  UTI ruled out: Patient's symptoms are more consistent with BPH.  No dysuria.  UA is not impressive for UTI.  Will discontinue Rocephin.  Hypophosphatemia: Continue replenishment and recheck in the morning.  Essential hypertension: Controlled.  Continue home medications which is clonidine 0.1 mg twice daily and metoprolol 50 mg twice daily.  Hyperlipidemia: Continue  rosuvastatin.  BPH: Continue Flomax and finasteride and follow-up with urology as outpatient.  Mood disorder: Continue home dose of Seroquel and follow-up with psychiatry as outpatient.  Does not have any suicidal thoughts or plans.  DVT prophylaxis: enoxaparin (LOVENOX) injection 40 mg Start: 07/08/23 1000   Code Status: Full Code  Family Communication:  None present at bedside.  Plan of care discussed with patient in length and he/she verbalized understanding and agreed with it.  Status is: Inpatient Remains inpatient appropriate because: Needs inpatient management of alcohol withdrawal.   Estimated body mass index is 29.98 kg/m as calculated from the following:   Height as of this encounter: 5\' 11"  (1.803 m).   Weight as of this encounter: 97.5 kg.    Nutritional Assessment: Body mass index is 29.98 kg/m.Marland Kitchen Seen by dietician.  I agree with the assessment and plan as outlined below: Nutrition Status:        . Skin Assessment: I have examined the patient's skin and I agree with the wound assessment as performed by the wound care RN as outlined below:    Consultants:  None  Procedures:  None  Antimicrobials:  Anti-infectives (From admission, onward)    Start     Dose/Rate Route Frequency Ordered Stop   07/08/23 0600  cefTRIAXone (ROCEPHIN) 1 g in sodium chloride 0.9 % 100 mL IVPB  Status:  Discontinued        1 g 200 mL/hr over 30 Minutes Intravenous Every 24 hours 07/08/23 0227 07/08/23 9562         Subjective: Patient seen and examined.  Continues to complain of anxiety but improving.  Appears slightly weaker than yesterday but less tremors today.  Objective: Vitals:   07/08/23 0746 07/08/23 1439 07/08/23 2053 07/09/23 0427  BP: 110/68 128/68 108/66 (!) 148/62  Pulse: 80 72 72 64  Resp: 18 18 17 17   Temp: 98.9 F (37.2 C) 98 F (36.7 C) 97.6 F (36.4 C) 97.8 F (36.6 C)  TempSrc:   Oral Oral  SpO2: 94%  95% 94%  Weight:      Height:         Intake/Output Summary (Last 24 hours) at 07/09/2023 0818 Last data filed at 07/09/2023 0400 Gross per 24 hour  Intake 1951.19 ml  Output 650 ml  Net 1301.19 ml   Filed Weights   07/07/23 1552 07/07/23 2336  Weight: 97 kg 97.5 kg    Examination:  General exam: Appears calm and comfortable, appears very weak Respiratory system: Clear to auscultation. Respiratory effort normal. Cardiovascular system: S1 & S2 heard, RRR. No JVD, murmurs, rubs, gallops or clicks. No pedal edema. Gastrointestinal system: Abdomen is nondistended, soft and nontender. No organomegaly or masses felt. Normal bowel sounds heard. Central nervous system: Alert and oriented. No focal neurological deficits. Extremities: Symmetric 5 x 5 power. Skin: No rashes, lesions or ulcers.  Psychiatry: Judgement and insight appear normal. Mood & affect appropriate.    Data Reviewed: I have personally reviewed following labs and imaging studies  CBC: Recent Labs  Lab 07/07/23 1626 07/08/23 0132  WBC 9.2 6.2  HGB 16.4 15.4  HCT 48.7 46.2  MCV 85.7 85.4  PLT 272 224   Basic Metabolic Panel: Recent Labs  Lab 07/07/23 1626 07/08/23 0132  NA 137 136  K 4.5 4.4  CL 102 106  CO2 12* 16*  GLUCOSE 86 110*  BUN 18 13  CREATININE 1.11 1.05  CALCIUM 8.6* 8.4*  MG 1.9 1.9  PHOS  --  2.0*   GFR: Estimated Creatinine Clearance: 76.8 mL/min (by C-G formula based on SCr of 1.05 mg/dL). Liver Function Tests: Recent Labs  Lab 07/07/23 1626  AST 30  ALT 27  ALKPHOS 78  BILITOT 0.8  PROT 8.1  ALBUMIN 4.0   Recent Labs  Lab 07/07/23 1626  LIPASE 23   No results for input(s): "AMMONIA" in the last 168 hours. Coagulation Profile: No results for input(s): "INR", "PROTIME" in the last 168 hours. Cardiac Enzymes: No results for input(s): "CKTOTAL", "CKMB", "CKMBINDEX", "TROPONINI" in the last 168 hours. BNP (last 3 results) No results for input(s): "PROBNP" in the last 8760 hours. HbA1C: No results for  input(s): "HGBA1C" in the last 72 hours. CBG: No results for input(s): "GLUCAP" in the last 168 hours. Lipid Profile: No results for input(s): "CHOL", "HDL", "LDLCALC", "TRIG", "CHOLHDL", "LDLDIRECT" in the last 72 hours. Thyroid Function Tests: No results for input(s): "TSH", "T4TOTAL", "FREET4", "T3FREE", "THYROIDAB" in the last 72 hours. Anemia Panel: No results for input(s): "VITAMINB12", "FOLATE", "FERRITIN", "TIBC", "IRON", "RETICCTPCT" in the last 72 hours. Sepsis Labs: Recent Labs  Lab 07/08/23 0132  LATICACIDVEN 1.6    No results found for this or any previous visit (from the past 240 hours).   Radiology Studies: CT Head Wo Contrast Result Date: 07/07/2023 CLINICAL DATA:  Mental status change, unknown cause; Neck trauma (Age >= 65y). EXAM: CT HEAD WITHOUT CONTRAST CT CERVICAL SPINE WITHOUT CONTRAST TECHNIQUE: Multidetector CT imaging of the head and cervical spine was performed following the standard protocol without intravenous contrast. Multiplanar CT image reconstructions of the cervical  spine were also generated. RADIATION DOSE REDUCTION: This exam was performed according to the departmental dose-optimization program which includes automated exposure control, adjustment of the mA and/or kV according to patient size and/or use of iterative reconstruction technique. COMPARISON:  CT head and cervical spine 02/07/2023. MRI head 02/03/2023. FINDINGS: CT HEAD FINDINGS Brain: There is no evidence of an acute infarct, intracranial hemorrhage, mass, midline shift, or extra-axial fluid collection. Mild cerebral atrophy is within normal limits for age. Vascular: Calcified atherosclerosis at the skull base. No hyperdense vessel. Skull: No acute fracture or suspicious lesion. Sinuses/Orbits: Unchanged mucous retention cyst in the right frontal sinus. Clear mastoid air cells. Unremarkable orbits. Other: None. CT CERVICAL SPINE FINDINGS Alignment: Cervical spine straightening.  No significant  listhesis. Skull base and vertebrae: No acute fracture or suspicious lesion. Soft tissues and spinal canal: No prevertebral fluid or swelling. No visible canal hematoma. Disc levels: Moderately advanced disc degeneration at C5-6 and C6-7 with broad-based posterior disc osteophyte complexes and ossification of the posterior longitudinal ligament resulting in mild-to-moderate spinal stenosis at both levels. Advanced upper cervical facet arthrosis. Partial facet ankylosis at C2-3. Upper chest: The included lung apices are clear. Other: None. IMPRESSION: 1. No evidence of acute intracranial abnormality or cervical spine fracture. 2. Cervical disc and facet degeneration with mild-to-moderate spinal stenosis at C5-6 and C6-7. Electronically Signed   By: Sebastian Ache M.D.   On: 07/07/2023 18:53   CT Cervical Spine Wo Contrast Result Date: 07/07/2023 CLINICAL DATA:  Mental status change, unknown cause; Neck trauma (Age >= 65y). EXAM: CT HEAD WITHOUT CONTRAST CT CERVICAL SPINE WITHOUT CONTRAST TECHNIQUE: Multidetector CT imaging of the head and cervical spine was performed following the standard protocol without intravenous contrast. Multiplanar CT image reconstructions of the cervical spine were also generated. RADIATION DOSE REDUCTION: This exam was performed according to the departmental dose-optimization program which includes automated exposure control, adjustment of the mA and/or kV according to patient size and/or use of iterative reconstruction technique. COMPARISON:  CT head and cervical spine 02/07/2023. MRI head 02/03/2023. FINDINGS: CT HEAD FINDINGS Brain: There is no evidence of an acute infarct, intracranial hemorrhage, mass, midline shift, or extra-axial fluid collection. Mild cerebral atrophy is within normal limits for age. Vascular: Calcified atherosclerosis at the skull base. No hyperdense vessel. Skull: No acute fracture or suspicious lesion. Sinuses/Orbits: Unchanged mucous retention cyst in the right  frontal sinus. Clear mastoid air cells. Unremarkable orbits. Other: None. CT CERVICAL SPINE FINDINGS Alignment: Cervical spine straightening.  No significant listhesis. Skull base and vertebrae: No acute fracture or suspicious lesion. Soft tissues and spinal canal: No prevertebral fluid or swelling. No visible canal hematoma. Disc levels: Moderately advanced disc degeneration at C5-6 and C6-7 with broad-based posterior disc osteophyte complexes and ossification of the posterior longitudinal ligament resulting in mild-to-moderate spinal stenosis at both levels. Advanced upper cervical facet arthrosis. Partial facet ankylosis at C2-3. Upper chest: The included lung apices are clear. Other: None. IMPRESSION: 1. No evidence of acute intracranial abnormality or cervical spine fracture. 2. Cervical disc and facet degeneration with mild-to-moderate spinal stenosis at C5-6 and C6-7. Electronically Signed   By: Sebastian Ache M.D.   On: 07/07/2023 18:53   CT ABDOMEN PELVIS W CONTRAST Result Date: 07/07/2023 CLINICAL DATA:  Nausea.  Acute generalized abdominal pain. EXAM: CT ABDOMEN AND PELVIS WITH CONTRAST TECHNIQUE: Multidetector CT imaging of the abdomen and pelvis was performed using the standard protocol following bolus administration of intravenous contrast. RADIATION DOSE REDUCTION: This exam was performed according  to the departmental dose-optimization program which includes automated exposure control, adjustment of the mA and/or kV according to patient size and/or use of iterative reconstruction technique. CONTRAST:  OMNIPAQUE IOHEXOL 300 MG/ML  SOLN COMPARISON:  December 17, 2022. FINDINGS: Lower chest: Minimal right basilar subsegmental atelectasis. Hepatobiliary: No cholelithiasis or biliary dilatation is noted. Hepatic steatosis. Pancreas: Unremarkable. No pancreatic ductal dilatation or surrounding inflammatory changes. Spleen: Normal in size without focal abnormality. Adrenals/Urinary Tract: Adrenal  glands are unremarkable. Kidneys are normal, without renal calculi, focal lesion, or hydronephrosis. Bladder is unremarkable. Stomach/Bowel: Stable findings consistent with prior hiatal hernia repair. There is no evidence of bowel obstruction or inflammation. The appendix appears normal. Vascular/Lymphatic: Aortic atherosclerosis. No enlarged abdominal or pelvic lymph nodes. Reproductive: Stable mild prostatic enlargement. Other: No ascites or hernia. Musculoskeletal: No acute or significant osseous findings. IMPRESSION: Hepatic steatosis. Stable mild prostatic enlargement. Status post prior hiatal hernia repair. Aortic Atherosclerosis (ICD10-I70.0). Electronically Signed   By: Lupita Raider M.D.   On: 07/07/2023 18:44   DG Chest Port 1 View Result Date: 07/07/2023 CLINICAL DATA:  Altered mental status.  Alcohol consumption. EXAM: PORTABLE CHEST 1 VIEW COMPARISON:  Radiographs 05/27/2023 and 04/11/2023.  CT 12/17/2022. FINDINGS: 1727 hours. Incomplete visualization of the left costophrenic angle on this portable examination. The heart size is stable at the upper limits of normal. The overall aeration of the left lung base appears improved from the most recent prior study. The right lung is clear. No evidence of pneumothorax or significant pleural effusion. The bones appear unchanged. Telemetry leads overlie the chest. IMPRESSION: Improved aeration of the left lung base compared with recent prior study. No new findings. Electronically Signed   By: Carey Bullocks M.D.   On: 07/07/2023 17:42    Scheduled Meds:  chlordiazePOXIDE  50 mg Oral Q8H   Followed by   Melene Muller ON 07/10/2023] chlordiazePOXIDE  50 mg Oral Q12H   Followed by   Melene Muller ON 07/11/2023] chlordiazePOXIDE  25 mg Oral Q12H   cloNIDine  0.1 mg Oral BID   enoxaparin (LOVENOX) injection  40 mg Subcutaneous Q24H   finasteride  5 mg Oral Daily   folic acid  1 mg Oral Daily   gabapentin  300 mg Oral BID   metoprolol tartrate  50 mg Oral BID    multivitamin with minerals  1 tablet Oral Daily   phosphorus  250 mg Oral BID   QUEtiapine  300 mg Oral QHS   rosuvastatin  20 mg Oral Q M,W,F   sodium chloride flush  3 mL Intravenous Q12H   tamsulosin  0.4 mg Oral QHS   thiamine  100 mg Oral Daily   Or   thiamine  100 mg Intravenous Daily   Continuous Infusions:     LOS: 1 day   Hughie Closs, MD [facility]  07/09/2023, 8:18 AM   *Please note that this is a verbal dictation therefore any spelling or grammatical errors are due to the "[employer]agon [employer] One" system interpretation.  Please page via Amion and do not message via secure chat for urgent patient care matters. Secure chat can be used for non urgent patient care matters.  How to contact the [facility] Attending or Consulting provider 7A - 7P or covering provider during after hours 7P -7A, for this patient?  Check the care team in [facility] and look for a) attending/consulting TRH provider listed and b) the [facility] team listed. Page or secure chat 7A-7P. Log into [email or web address] and use [place]'s  universal password to access. If you do not have the password, please contact the hospital operator. Locate the [facility] provider you are looking for under [facility] and page to a number that you can be directly reached. If you still have difficulty reaching the provider, please page the [facility] (Director on Call) for the Hospitalists listed on amion for assistance.

## 2023-07-10 DIAGNOSIS — F10930 Alcohol use, unspecified with withdrawal, uncomplicated: Secondary | ICD-10-CM | POA: Diagnosis not present

## 2023-07-10 LAB — URINE CULTURE: Culture: >=100000 — AB

## 2023-07-10 LAB — PHOSPHORUS: Phosphorus: 4.1 mg/dL (ref 2.5–4.6)

## 2023-07-10 NOTE — Evaluation (Signed)
 Occupational Therapy Evaluation Patient Details Name: Ruben [name] MRN: [id number] DOB: Oct 10, 1950 Today's Date: 07/10/2023   History of Present Illness   73 y.o. male admitted 3/29 with Alcohol use disorder/alcohol withdrawal, Alcoholic/high anion gap metabolic and starvation ketosis/dehydration. PMHx: alcohol use disorder, hypertension, hyperlipidemia, BPH, mood disorder.     Clinical Impressions Pt reports ind at baseline with ADLs/functional mobility, has assist for med mgmt from cousin Drue Flirt) whom he's been living with the past month. Pt currently needing up to min A for ADLs, CGA for bed mobility and supervision for transfers without AD. Pt with impaired cognition, but suspect close to baseline. Pt presenting with impairments listed below, will follow acutely. Recommend HHOT at d/c pending progression.     If plan is discharge home, recommend the following:   A little help with walking and/or transfers;A little help with bathing/dressing/bathroom;Assistance with cooking/housework;Direct supervision/assist for financial management;Direct supervision/assist for medications management;Assist for transportation     Functional Status Assessment   Patient has had a recent decline in their functional status and demonstrates the ability to make significant improvements in function in a reasonable and predictable amount of time.     Equipment Recommendations   Tub/shower seat (if pt's cousin does not have)     Recommendations for Other Services         Precautions/Restrictions   Precautions Precautions: Fall Recall of Precautions/Restrictions: Intact Restrictions Weight Bearing Restrictions Per Provider Order: No     Mobility Bed Mobility Overal bed mobility: Needs Assistance Bed Mobility: Supine to Sit, Sit to Supine     Supine to sit: Contact guard Sit to supine: Contact guard assist        Transfers Overall transfer level: Needs assistance Equipment  used: None Transfers: Sit to/from Stand Sit to Stand: Supervision                  Balance Overall balance assessment: Mild deficits observed, not formally tested                                         ADL either performed or assessed with clinical judgement   ADL Overall ADL's : Needs assistance/impaired Eating/Feeding: Set up;Sitting   Grooming: Set up;Sitting   Upper Body Bathing: Minimal assistance;Sitting   Lower Body Bathing: Minimal assistance;Sitting/lateral leans   Upper Body Dressing : Minimal assistance   Lower Body Dressing: Minimal assistance   Toilet Transfer: Contact guard assist;Ambulation   Toileting- Clothing Manipulation and Hygiene: Contact guard assist       Functional mobility during ADLs: Contact guard assist       Vision   Vision Assessment?: No apparent visual deficits Additional Comments: wears glasses for reading     Perception Perception: Not tested       Praxis Praxis: Not tested       Pertinent Vitals/Pain Pain Assessment Pain Assessment: No/denies pain     Extremity/Trunk Assessment Upper Extremity Assessment Upper Extremity Assessment: Overall WFL for tasks assessed   Lower Extremity Assessment Lower Extremity Assessment: Defer to PT evaluation       Communication Communication Communication: No apparent difficulties   Cognition Arousal: Alert Behavior During Therapy: WFL for tasks assessed/performed Cognition: Cognition impaired       Memory impairment (select all impairments): Short-term memory  Following commands: Intact       Cueing  General Comments   Cueing Techniques: Verbal cues  VSS   Exercises     Shoulder Instructions      Home Living Family/patient expects to be discharged to:: Private residence Living Arrangements: Other relatives (cousin Candy) Available Help at Discharge: Family;Available PRN/intermittently Type of Home:  House Home Access: Stairs to enter [employer] of Steps: 1 Entrance Stairs-Rails: None Home Layout: One level     Bathroom Shower/Tub: Tub/shower unit;Walk-in shower   Bathroom Toilet: Handicapped height     Home Equipment: Rollator (4 wheels);Shower seat   Additional Comments: Plans to stay with his cousin Candy at discharge; in [place]. Was living with his cousin 1 month PTA      Prior Functioning/Environment Prior Level of Function : Independent/Modified Independent;Driving             Mobility Comments: reports ind at baseline. denies falls ADLs Comments: States ind, cousin Drue Flirt assists with med mgmt    OT Problem List: Decreased strength;Decreased range of motion;Decreased activity tolerance;Impaired balance (sitting and/or standing);Decreased cognition   OT Treatment/Interventions: Self-care/ADL training;Therapeutic exercise;Energy conservation;DME and/or AE instruction;Therapeutic activities;Balance training;Patient/family education      OT Goals(Current goals can be found in the care plan section)   Acute Rehab OT Goals Patient Stated Goal: none stated OT Goal Formulation: With patient Time For Goal Achievement: 07/24/23 Potential to Achieve Goals: Good ADL Goals Pt Will Perform Lower Body Dressing: Independently;sitting/lateral leans Pt Will Perform Tub/Shower Transfer: Tub transfer;Shower transfer;Independently;ambulating Additional ADL Goal #1: pt will tolerate OOB activity x15 min in order to improve activity olerance for ADLs   OT Frequency:  Min 1X/week    Co-evaluation              AM-PAC OT "6 Clicks" Daily Activity     Outcome Measure Help from another person eating meals?: None Help from another person taking care of personal grooming?: None Help from another person toileting, which includes using toliet, bedpan, or urinal?: A Little Help from another person bathing (including washing, rinsing, drying)?: A Little Help from  another person to put on and taking off regular upper body clothing?: A Little Help from another person to put on and taking off regular lower body clothing?: A Little 6 Click Score: 20   End of Session Equipment Utilized During Treatment: Gait belt Nurse Communication: Mobility status  Activity Tolerance: Patient tolerated treatment well Patient left: in bed;with call bell/phone within reach;with bed alarm set  OT Visit Diagnosis: Unsteadiness on feet (R26.81);Other abnormalities of gait and mobility (R26.89);Muscle weakness (generalized) (M62.81)                Time: 8657-8469 OT Time Calculation (min): 16 min Charges:  OT General Charges $OT Visit: 1 Visit OT Evaluation $OT Eval Low Complexity: 1 Low  Hajra Port K, OTD, OTR/L SecureChat Preferred Acute Rehab [phone]   Taeshawn Helfman K Koonce 07/10/2023, 8:32 AM

## 2023-07-10 NOTE — Progress Notes (Signed)
 PROGRESS NOTE    Ruben [name]  NFA:[id number] DOB: Jan 06, 1951 DOA: 07/07/2023 PCP: Ruben Pilon, FNP   Brief Narrative:  Ruben [name] is a 73 y.o. male with hx of alcohol use disorder, hypertension, hyperlipidemia, BPH, mood disorder,  who was  was brought in by EMS after neighbors called for wellness check because they had not seen him moving around the house as he normally does.  Patient acknowledges heavy alcohol use ever since retiring in 2020.  Drinking approximately 700 mL of liquor per day.  No history of seizure or DTs per his knowledge.  He is interested in quitting drinking.  He feels depressed but has no suicidal ideation.  Has tried acamprosate but not currently taking.  Also previously on naltrexone but states he "did not give it a good go".  Otherwise has nausea but no vomiting.  No other recent illness.  Very bothered by BPH symptoms.  Was followed by urology outpatient.  Assessment & Plan:   Principal Problem:   Alcohol withdrawal (HCC) Active Problems:   Alcoholic ketoacidosis   UTI (urinary tract infection)  Alcohol use disorder/alcohol withdrawal: Admits to drinking around 700 mL of liquor each day, last drink on 07/07/2023.  EtOH 215 upon arrival.  CIWA under 5 and has not required any as needed Ativan since admission.  Will continue Librium tapering protocol along with as needed Ativan, multivitamin and folate.  Since patient is still within 3 to 5-day window for withdrawal and thus at high risk so we will continue inpatient management for next 1 to 2 days.  Also patient did have some alcohol withdrawal encephalopathy which was transient and resolved while I was still in the room.  Alcoholic/high anion gap metabolic and starvation ketosis/dehydration: Bicarb 12 upon arrival which has improved with IV fluids.  UTI ruled out: Patient's symptoms are more consistent with BPH.  No dysuria.  UA is not impressive for UTI.  Will discontinue Rocephin.  Hypophosphatemia:  Replenished and resolved.  Essential hypertension: Controlled.  Continue home medications which is clonidine 0.1 mg twice daily and metoprolol 50 mg twice daily.  Hyperlipidemia: Continue rosuvastatin.  BPH: Continue Flomax and finasteride and follow-up with urology as outpatient.  Mood disorder: Continue home dose of Seroquel and follow-up with psychiatry as outpatient.  Does not have any suicidal thoughts or plans.  DVT prophylaxis: enoxaparin (LOVENOX) injection 40 mg Start: 07/08/23 1000   Code Status: Full Code  Family Communication:  None present at bedside.  Plan of care discussed with patient in length and he/she verbalized understanding and agreed with it.  Status is: Inpatient Remains inpatient appropriate because: Needs inpatient management of alcohol withdrawal.   Estimated body mass index is 29.98 kg/m as calculated from the following:   Height as of this encounter: 5\' 11"  (1.803 m).   Weight as of this encounter: 97.5 kg.    Nutritional Assessment: Body mass index is 29.98 kg/m.Marland Kitchen Seen by dietician.  I agree with the assessment and plan as outlined below: Nutrition Status:        . Skin Assessment: I have examined the patient's skin and I agree with the wound assessment as performed by the wound care RN as outlined below:    Consultants:  None  Procedures:  None  Antimicrobials:  Anti-infectives (From admission, onward)    Start     Dose/Rate Route Frequency Ordered Stop   07/08/23 0600  cefTRIAXone (ROCEPHIN) 1 g in sodium chloride 0.9 % 100 mL IVPB  Status:  Discontinued        1 g 200 mL/hr over 30 Minutes Intravenous Every 24 hours 07/08/23 0227 07/08/23 1610         Subjective: Patient seen and examined.  Sleepy but arousable.  When he woke up, briefly, he was having altered mental status.  No other complaints.  Anxiety has improved.  Appears weaker than yesterday, I will order mobilize twice daily.  Objective: Vitals:   07/09/23 2138  07/09/23 2235 07/09/23 2237 07/10/23 0518  BP: 125/72 125/72 125/72 110/64  Pulse: 77  77 66  Resp: 18   18  Temp: (!) 97.5 F (36.4 C)   97.8 F (36.6 C)  TempSrc: Oral     SpO2: 97%   95%  Weight:      Height:        Intake/Output Summary (Last 24 hours) at 07/10/2023 0855 Last data filed at 07/10/2023 0724 Gross per 24 hour  Intake 720 ml  Output 1825 ml  Net -1105 ml   Filed Weights   07/07/23 1552 07/07/23 2336  Weight: 97 kg 97.5 kg    Examination:  General exam: Appears calm and comfortable, appears weaker than yesterday. Respiratory system: Clear to auscultation. Respiratory effort normal. Cardiovascular system: S1 & S2 heard, RRR. No JVD, murmurs, rubs, gallops or clicks. No pedal edema. Gastrointestinal system: Abdomen is nondistended, soft and nontender. No organomegaly or masses felt. Normal bowel sounds heard. Central nervous system: Sleepy arousable, when awake, temporally disoriented but became oriented quickly. No focal neurological deficits. Extremities: Symmetric 5 x 5 power. Skin: No rashes, lesions or ulcers.   Data Reviewed: I have personally reviewed following labs and imaging studies  CBC: Recent Labs  Lab 07/07/23 1626 07/08/23 0132  WBC 9.2 6.2  HGB 16.4 15.4  HCT 48.7 46.2  MCV 85.7 85.4  PLT 272 224   Basic Metabolic Panel: Recent Labs  Lab 07/07/23 1626 07/08/23 0132 07/09/23 0955 07/10/23 0456  NA 137 136 138  --   K 4.5 4.4 3.8  --   CL 102 106 106  --   CO2 12* 16* 22  --   GLUCOSE 86 110* 152*  --   BUN 18 13 12   --   CREATININE 1.11 1.05 1.08  --   CALCIUM 8.6* 8.4* 8.4*  --   MG 1.9 1.9  --   --   PHOS  --  2.0*  --  4.1   GFR: Estimated Creatinine Clearance: 74.7 mL/min (by C-G formula based on SCr of 1.08 mg/dL). Liver Function Tests: Recent Labs  Lab 07/07/23 1626 07/09/23 0955  AST 30 22  ALT 27 21  ALKPHOS 78 58  BILITOT 0.8 0.9  PROT 8.1 6.0*  ALBUMIN 4.0 2.9*   Recent Labs  Lab 07/07/23 1626   LIPASE 23   No results for input(s): "AMMONIA" in the last 168 hours. Coagulation Profile: No results for input(s): "INR", "PROTIME" in the last 168 hours. Cardiac Enzymes: No results for input(s): "CKTOTAL", "CKMB", "CKMBINDEX", "TROPONINI" in the last 168 hours. BNP (last 3 results) No results for input(s): "PROBNP" in the last 8760 hours. HbA1C: No results for input(s): "HGBA1C" in the last 72 hours. CBG: No results for input(s): "GLUCAP" in the last 168 hours. Lipid Profile: No results for input(s): "CHOL", "HDL", "LDLCALC", "TRIG", "CHOLHDL", "LDLDIRECT" in the last 72 hours. Thyroid Function Tests: No results for input(s): "TSH", "T4TOTAL", "FREET4", "T3FREE", "THYROIDAB" in the last 72 hours. Anemia Panel: No results for input(s): "VITAMINB12", "  FOLATE", "FERRITIN", "TIBC", "IRON", "RETICCTPCT" in the last 72 hours. Sepsis Labs: Recent Labs  Lab 07/08/23 0132  LATICACIDVEN 1.6    Recent Results (from the past 240 hours)  Urine Culture     Status: Abnormal (Preliminary result)   Collection Time: 07/08/23  1:35 AM   Specimen: Urine, Clean Catch  Result Value Ref Range Status   Specimen Description URINE, CLEAN CATCH  Final   Special Requests   Final    NONE Performed at [facility], [address], [place], [place] [address]    Culture >=100,000 COLONIES/mL STAPHYLOCOCCUS EPIDERMIDIS (A)  Final   Report Status PENDING  Incomplete     Radiology Studies: No results found.   Scheduled Meds:  chlordiazePOXIDE  50 mg Oral Q12H   Followed by   Melene Muller ON 07/11/2023] chlordiazePOXIDE  25 mg Oral Q12H   cloNIDine  0.1 mg Oral BID   enoxaparin (LOVENOX) injection  40 mg Subcutaneous Q24H   finasteride  5 mg Oral Daily   folic acid  1 mg Oral Daily   gabapentin  300 mg Oral BID   metoprolol tartrate  50 mg Oral BID   multivitamin with minerals  1 tablet Oral Daily   phosphorus  250 mg Oral BID   QUEtiapine  300 mg Oral QHS   rosuvastatin  20 mg Oral Q M,W,F    sodium chloride flush  3 mL Intravenous Q12H   tamsulosin  0.4 mg Oral QHS   thiamine  100 mg Oral Daily   Or   thiamine  100 mg Intravenous Daily   Continuous Infusions:     LOS: 2 days   Hughie Closs, MD [facility]  07/10/2023, 8:55 AM   *Please note that this is a verbal dictation therefore any spelling or grammatical errors are due to the "Dragon [employer] One" system interpretation.  Please page via Amion and do not message via secure chat for urgent patient care matters. Secure chat can be used for non urgent patient care matters.  How to contact the [facility] Attending or Consulting provider 7A - 7P or covering provider during after hours 7P -7A, for this patient?  Check the care team in [facility] and look for a) attending/consulting TRH provider listed and b) the [facility] team listed. Page or secure chat 7A-7P. Log into [email or web address] and use [facility]'s universal password to access. If you do not have the password, please contact the hospital operator. Locate the [facility] provider you are looking for under [facility] and page to a number that you can be directly reached. If you still have difficulty reaching the provider, please page the [facility] (Director on Call) for the Hospitalists listed on amion for assistance.

## 2023-07-10 NOTE — TOC Progression Note (Signed)
 Transition of Care [facility]) - Progression Note    Patient Details  Name: Ruben [name] MRN: [id number] Date of Birth: [birth year]  Transition of [facility]) CM/SW Contact  Janae Bridgeman, RN Phone Number: 07/10/2023, 12:32 PM  Clinical Narrative:    CM met with the patient at the bedside to offer Medicare choice regarding OUtpatient therapy services and patient preferred referral to [facility] in [facility].  Outpatient referral placed and clinicals were faxed to [facility] to fax # [phone].   Expected Discharge Plan: Home/Self Care Barriers to Discharge: Continued Medical Work up  Expected Discharge Plan and Services                                               Social Determinants of Health (SDOH) Interventions SDOH Screenings   Food Insecurity: No Food Insecurity (07/07/2023)  Housing: Patient Declined (07/07/2023)  Transportation Needs: No Transportation Needs (07/07/2023)  Utilities: Not At Risk (07/07/2023)  Social Connections: Socially Isolated (07/08/2023)  Tobacco Use: Low Risk  (07/08/2023)    Readmission Risk Interventions    07/09/2023    3:45 PM 03/23/2023   10:01 AM 02/27/2023    4:15 PM  Readmission Risk Prevention Plan  Transportation Screening Complete Complete Complete  PCP or Specialist Appt within 3-5 Days  Complete Complete  HRI or Home Care Consult  Complete Complete  Social Work Consult for Recovery Care Planning/Counseling  Complete Complete  Palliative Care Screening  Not Applicable Not Applicable  Medication Review [employer]) Complete Complete Complete  PCP or Specialist appointment within 3-5 days of discharge Complete    HRI or Home Care Consult Complete    SW Recovery Care/Counseling Consult Complete    Palliative Care Screening Not Applicable    Skilled Nursing Facility Not Applicable

## 2023-07-10 NOTE — Plan of Care (Signed)

## 2023-07-11 ENCOUNTER — Inpatient Hospital Stay (HOSPITAL_COMMUNITY)

## 2023-07-11 DIAGNOSIS — F10930 Alcohol use, unspecified with withdrawal, uncomplicated: Secondary | ICD-10-CM | POA: Diagnosis not present

## 2023-07-11 LAB — CBC WITH DIFFERENTIAL/PLATELET
Abs Immature Granulocytes: 0.02 10*3/uL (ref 0.00–0.07)
Basophils Absolute: 0.1 10*3/uL (ref 0.0–0.1)
Basophils Relative: 1 %
Eosinophils Absolute: 0.4 10*3/uL (ref 0.0–0.5)
Eosinophils Relative: 6 %
HCT: 45.2 % (ref 39.0–52.0)
Hemoglobin: 15 g/dL (ref 13.0–17.0)
Immature Granulocytes: 0 %
Lymphocytes Relative: 16 %
Lymphs Abs: 1.1 10*3/uL (ref 0.7–4.0)
MCH: 28.4 pg (ref 26.0–34.0)
MCHC: 33.2 g/dL (ref 30.0–36.0)
MCV: 85.6 fL (ref 80.0–100.0)
Monocytes Absolute: 0.4 10*3/uL (ref 0.1–1.0)
Monocytes Relative: 6 %
Neutro Abs: 4.8 10*3/uL (ref 1.7–7.7)
Neutrophils Relative %: 71 %
Platelets: 154 10*3/uL (ref 150–400)
RBC: 5.28 MIL/uL (ref 4.22–5.81)
RDW: 13.6 % (ref 11.5–15.5)
WBC: 6.7 10*3/uL (ref 4.0–10.5)
nRBC: 0 % (ref 0.0–0.2)

## 2023-07-11 LAB — BASIC METABOLIC PANEL WITH GFR
Anion gap: 13 (ref 5–15)
BUN: 13 mg/dL (ref 8–23)
CO2: 21 mmol/L — ABNORMAL LOW (ref 22–32)
Calcium: 8.5 mg/dL — ABNORMAL LOW (ref 8.9–10.3)
Chloride: 103 mmol/L (ref 98–111)
Creatinine, Ser: 1.15 mg/dL (ref 0.61–1.24)
GFR, Estimated: >60 mL/min (ref >60)
Glucose, Bld: 171 mg/dL — ABNORMAL HIGH (ref 70–99)
Potassium: 3.8 mmol/L (ref 3.5–5.1)
Sodium: 137 mmol/L (ref 135–145)

## 2023-07-11 MED ORDER — DOCUSATE SODIUM 100 MG PO CAPS
100.0000 mg | ORAL_CAPSULE | Freq: Every day | ORAL | Status: DC
  Administered 2023-07-11 – 2023-07-12 (×2): 100 mg via ORAL
  Filled 2023-07-11 (×2): qty 1

## 2023-07-11 NOTE — Plan of Care (Signed)
  Problem: Elimination: Goal: Will not experience complications related to bowel motility Outcome: Not Progressing Goal: Will not experience complications related to urinary retention Outcome: Progressing   Problem: Clinical Measurements: Goal: Ability to maintain clinical measurements within normal limits will improve Outcome: Completed/Met Goal: Will remain free from infection Outcome: Completed/Met Goal: Diagnostic test results will improve Outcome: Completed/Met Goal: Respiratory complications will improve Outcome: Completed/Met Goal: Cardiovascular complication will be avoided Outcome: Completed/Met   Problem: Education: Goal: Knowledge of General Education information will improve Description: Including pain rating scale, medication(s)/side effects and non-pharmacologic comfort measures Outcome: Progressing   Problem: Health Behavior/Discharge Planning: Goal: Ability to manage health-related needs will improve Outcome: Progressing   Problem: Nutrition: Goal: Adequate nutrition will be maintained Outcome: Progressing   Problem: Activity: Goal: Risk for activity intolerance will decrease Outcome: Progressing

## 2023-07-11 NOTE — Progress Notes (Signed)
 Mobility Specialist: Progress Note   07/11/23 1247  Mobility  Activity Ambulated with assistance in hallway  Level of Assistance Standby assist, set-up cues, supervision of patient - no hands on  Assistive Device None  Distance Ambulated (ft) 200 ft  Activity Response Tolerated well  Mobility Referral Yes  Mobility visit 1 Mobility  Mobility Specialist Start Time (ACUTE ONLY) 1031  Mobility Specialist Stop Time (ACUTE ONLY) 1052  Mobility Specialist Time Calculation (min) (ACUTE ONLY) 21 min    Pt was agreeable to mobility session - received in bed. SV throughout. Initially unsteady d/t bathroom urgency but remainder of the session no unsteadiness or LOB. Returned to room without fault. Able to doff wet socks but needed assistance to help put new ones on. Observed some wheezing at EOS, SpO2 WFL on RA and HR 125-127 sitting EOB after activity. Reports feeling sluggish today and seemingly worn out after mobility session. Left supine in bed with all needs met, call bell in reach. Bed alarm on.   Maurene Capes Mobility Specialist Please contact via SecureChat or Rehab office at [phone]

## 2023-07-11 NOTE — Discharge Summary (Signed)
 Physician Discharge Summary   Patient: Ruben [name] MRN: [id number] DOB: June 28, 1950  Admit date:     07/07/2023  Discharge date: {dischdate:26783}  Discharge Physician: Hughie Closs   PCP: Soundra Pilon, FNP   Recommendations at discharge:  {Tip this will not be part of the note when signed- Example include specific recommendations for outpatient follow-up, pending tests to follow-up on. (Optional):26781}  ***  Discharge Diagnoses: Principal Problem:   Alcohol withdrawal (HCC) Active Problems:   Alcohol intoxication (HCC)   GERD (gastroesophageal reflux disease)   BPH (benign prostatic hyperplasia)   Essential hypertension   Alcoholic ketoacidosis  Resolved Problems:   * No resolved hospital problems. [facility] Course: No notes on file  Assessment and Plan: No notes have been filed under this hospital service. Service: Hospitalist     {Tip this will not be part of the note when signed Body mass index is 29.98 kg/m. , ,  (Optional):26781}  {(NOTE) Pain control PDMP Statment (Optional):26782} Consultants: *** Procedures performed: ***  Disposition: {Plan; Disposition:26390} Diet recommendation:  {Diet_Plan:26776} DISCHARGE MEDICATION: Allergies as of 07/11/2023   No Known Allergies      Medication List     STOP taking these medications    chlordiazePOXIDE 25 MG capsule Commonly known as: LIBRIUM       TAKE these medications    acamprosate 333 MG tablet Commonly known as: CAMPRAL Take by mouth.   albuterol 108 (90 Base) MCG/ACT inhaler Commonly known as: VENTOLIN HFA Inhale 2 puffs into the lungs every 6 (six) hours as needed.   cloNIDine 0.1 MG tablet Commonly known as: CATAPRES Take 1 tablet (0.1 mg total) by mouth 2 (two) times daily. What changed: when to take this   desvenlafaxine 50 MG 24 hr tablet Commonly known as: PRISTIQ Take 50 mg by mouth daily.   finasteride 5 MG tablet Commonly known as: PROSCAR Take 5 mg by mouth at  bedtime.   folic acid 1 MG tablet Commonly known as: FOLVITE Take 1 tablet (1 mg total) by mouth daily.   gabapentin 300 MG capsule Commonly known as: NEURONTIN Take 300 mg by mouth at bedtime.   hydrOXYzine 50 MG tablet Commonly known as: ATARAX Take 50 mg by mouth at bedtime.   metoprolol tartrate 50 MG tablet Commonly known as: LOPRESSOR Take 50 mg by mouth 2 (two) times daily.   Milk of Magnesia 2400 MG/30ML suspension Generic drug: magnesium hydroxide Take 30 mLs by mouth every other day.   oxybutynin 5 MG tablet Commonly known as: DITROPAN Take 1 tablet (5 mg total) by mouth 2 (two) times daily as needed for bladder spasms.   QUEtiapine 300 MG tablet Commonly known as: SEROQUEL Take 300 mg by mouth at bedtime.   rosuvastatin 20 MG tablet Commonly known as: CRESTOR Take 20 mg by mouth every Monday, Wednesday, and Friday.   tamsulosin 0.4 MG Caps capsule Commonly known as: FLOMAX Take 0.4 mg by mouth in the morning and at bedtime.   thiamine 100 MG tablet Commonly known as: Vitamin B-1 Take 1 tablet (100 mg total) by mouth daily.        Follow-up Information     [facility] up on 07/24/2023.   Why: You have an appointment scheduled on Tuesday April 15th at 11am with your provider, Joella Prince. Please contact your facility if you need to change your appt. time. Thanks! Contact information: [address] [place] [address] [phone]  Soundra Pilon, FNP Follow up in 1 week(s).   Specialty: Family Medicine Contact information: [phone] [address] [place] [address] [phone]                Discharge Exam: Ceasar Mons Weights   07/07/23 1552 07/07/23 2336  Weight: 97 kg 97.5 kg   ***  Condition at discharge: {DC Condition:26389}  The results of significant diagnostics from this hospitalization (including imaging, microbiology, ancillary and laboratory) are listed below for reference.    Imaging Studies: CT Head Wo Contrast Result Date: 07/07/2023 CLINICAL DATA:  Mental status change, unknown cause; Neck trauma (Age >= 65y). EXAM: CT HEAD WITHOUT CONTRAST CT CERVICAL SPINE WITHOUT CONTRAST TECHNIQUE: Multidetector CT imaging of the head and cervical spine was performed following the standard protocol without intravenous contrast. Multiplanar CT image reconstructions of the cervical spine were also generated. RADIATION DOSE REDUCTION: This exam was performed according to the departmental dose-optimization program which includes automated exposure control, adjustment of the mA and/or kV according to patient size and/or use of iterative reconstruction technique. COMPARISON:  CT head and cervical spine 02/07/2023. MRI head 02/03/2023. FINDINGS: CT HEAD FINDINGS Brain: There is no evidence of an acute infarct, intracranial hemorrhage, mass, midline shift, or extra-axial fluid collection. Mild cerebral atrophy is within normal limits for age. Vascular: Calcified atherosclerosis at the skull base. No hyperdense vessel. Skull: No acute fracture or suspicious lesion. Sinuses/Orbits: Unchanged mucous retention cyst in the right frontal sinus. Clear mastoid air cells. Unremarkable orbits. Other: None. CT CERVICAL SPINE FINDINGS Alignment: Cervical spine straightening.  No significant listhesis. Skull base and vertebrae: No acute fracture or suspicious lesion. Soft tissues and spinal canal: No prevertebral fluid or swelling. No visible canal hematoma. Disc levels: Moderately advanced disc degeneration at C5-6 and C6-7 with broad-based posterior disc osteophyte complexes and ossification of the posterior longitudinal ligament resulting in mild-to-moderate spinal stenosis at both levels. Advanced upper cervical facet arthrosis. Partial facet ankylosis at C2-3. Upper chest: The included lung apices are clear. Other: None. IMPRESSION: 1. No evidence of acute intracranial abnormality or cervical spine  fracture. 2. Cervical disc and facet degeneration with mild-to-moderate spinal stenosis at C5-6 and C6-7. Electronically Signed   By: Sebastian Ache M.D.   On: 07/07/2023 18:53   CT Cervical Spine Wo Contrast Result Date: 07/07/2023 CLINICAL DATA:  Mental status change, unknown cause; Neck trauma (Age >= 65y). EXAM: CT HEAD WITHOUT CONTRAST CT CERVICAL SPINE WITHOUT CONTRAST TECHNIQUE: Multidetector CT imaging of the head and cervical spine was performed following the standard protocol without intravenous contrast. Multiplanar CT image reconstructions of the cervical spine were also generated. RADIATION DOSE REDUCTION: This exam was performed according to the departmental dose-optimization program which includes automated exposure control, adjustment of the mA and/or kV according to patient size and/or use of iterative reconstruction technique. COMPARISON:  CT head and cervical spine 02/07/2023. MRI head 02/03/2023. FINDINGS: CT HEAD FINDINGS Brain: There is no evidence of an acute infarct, intracranial hemorrhage, mass, midline shift, or extra-axial fluid collection. Mild cerebral atrophy is within normal limits for age. Vascular: Calcified atherosclerosis at the skull base. No hyperdense vessel. Skull: No acute fracture or suspicious lesion. Sinuses/Orbits: Unchanged mucous retention cyst in the right frontal sinus. Clear mastoid air cells. Unremarkable orbits. Other: None. CT CERVICAL SPINE FINDINGS Alignment: Cervical spine straightening.  No significant listhesis. Skull base and vertebrae: No acute fracture or suspicious lesion. Soft tissues and spinal canal: No prevertebral fluid or swelling. No visible canal hematoma.  Disc levels: Moderately advanced disc degeneration at C5-6 and C6-7 with broad-based posterior disc osteophyte complexes and ossification of the posterior longitudinal ligament resulting in mild-to-moderate spinal stenosis at both levels. Advanced upper cervical facet arthrosis. Partial facet  ankylosis at C2-3. Upper chest: The included lung apices are clear. Other: None. IMPRESSION: 1. No evidence of acute intracranial abnormality or cervical spine fracture. 2. Cervical disc and facet degeneration with mild-to-moderate spinal stenosis at C5-6 and C6-7. Electronically Signed   By: Sebastian Ache M.D.   On: 07/07/2023 18:53   CT ABDOMEN PELVIS W CONTRAST Result Date: 07/07/2023 CLINICAL DATA:  Nausea.  Acute generalized abdominal pain. EXAM: CT ABDOMEN AND PELVIS WITH CONTRAST TECHNIQUE: Multidetector CT imaging of the abdomen and pelvis was performed using the standard protocol following bolus administration of intravenous contrast. RADIATION DOSE REDUCTION: This exam was performed according to the departmental dose-optimization program which includes automated exposure control, adjustment of the mA and/or kV according to patient size and/or use of iterative reconstruction technique. CONTRAST:  OMNIPAQUE IOHEXOL 300 MG/ML  SOLN COMPARISON:  December 17, 2022. FINDINGS: Lower chest: Minimal right basilar subsegmental atelectasis. Hepatobiliary: No cholelithiasis or biliary dilatation is noted. Hepatic steatosis. Pancreas: Unremarkable. No pancreatic ductal dilatation or surrounding inflammatory changes. Spleen: Normal in size without focal abnormality. Adrenals/Urinary Tract: Adrenal glands are unremarkable. Kidneys are normal, without renal calculi, focal lesion, or hydronephrosis. Bladder is unremarkable. Stomach/Bowel: Stable findings consistent with prior hiatal hernia repair. There is no evidence of bowel obstruction or inflammation. The appendix appears normal. Vascular/Lymphatic: Aortic atherosclerosis. No enlarged abdominal or pelvic lymph nodes. Reproductive: Stable mild prostatic enlargement. Other: No ascites or hernia. Musculoskeletal: No acute or significant osseous findings. IMPRESSION: Hepatic steatosis. Stable mild prostatic enlargement. Status post prior hiatal hernia repair.  Aortic Atherosclerosis (ICD10-I70.0). Electronically Signed   By: Lupita Raider M.D.   On: 07/07/2023 18:44   DG Chest Port 1 View Result Date: 07/07/2023 CLINICAL DATA:  Altered mental status.  Alcohol consumption. EXAM: PORTABLE CHEST 1 VIEW COMPARISON:  Radiographs 05/27/2023 and 04/11/2023.  CT 12/17/2022. FINDINGS: 1727 hours. Incomplete visualization of the left costophrenic angle on this portable examination. The heart size is stable at the upper limits of normal. The overall aeration of the left lung base appears improved from the most recent prior study. The right lung is clear. No evidence of pneumothorax or significant pleural effusion. The bones appear unchanged. Telemetry leads overlie the chest. IMPRESSION: Improved aeration of the left lung base compared with recent prior study. No new findings. Electronically Signed   By: Carey Bullocks M.D.   On: 07/07/2023 17:42    Microbiology: Results for orders placed or performed during the hospital encounter of 07/07/23  Urine Culture     Status: Abnormal   Collection Time: 07/08/23  1:35 AM   Specimen: Urine, Clean Catch  Result Value Ref Range Status   Specimen Description URINE, CLEAN CATCH  Final   Special Requests   Final    NONE Performed at [facility], [address] [place], [place] [address]    Culture >=100,000 COLONIES/mL STAPHYLOCOCCUS EPIDERMIDIS (A)  Final   Report Status 07/10/2023 FINAL  Final   Organism ID, Bacteria STAPHYLOCOCCUS EPIDERMIDIS (A)  Final      Susceptibility   Staphylococcus epidermidis - MIC*    CIPROFLOXACIN <=0.5 SENSITIVE Sensitive     GENTAMICIN <=0.5 SENSITIVE Sensitive     NITROFURANTOIN <=16 SENSITIVE Sensitive     OXACILLIN >=4 RESISTANT Resistant  TETRACYCLINE 2 SENSITIVE Sensitive     VANCOMYCIN 2 SENSITIVE Sensitive     TRIMETH/SULFA 20 SENSITIVE Sensitive     RIFAMPIN <=0.5 SENSITIVE Sensitive     Inducible Clindamycin NEGATIVE Sensitive     * >=100,000 COLONIES/mL  STAPHYLOCOCCUS EPIDERMIDIS    Labs: CBC: Recent Labs  Lab 07/07/23 1626 07/08/23 0132 07/11/23 1118  WBC 9.2 6.2 6.7  NEUTROABS  --   --  4.8  HGB 16.4 15.4 15.0  HCT 48.7 46.2 45.2  MCV 85.7 85.4 85.6  PLT 272 224 154   Basic Metabolic Panel: Recent Labs  Lab 07/07/23 1626 07/08/23 0132 07/09/23 0955 07/10/23 0456 07/11/23 1118  NA 137 136 138  --  137  K 4.5 4.4 3.8  --  3.8  CL 102 106 106  --  103  CO2 12* 16* 22  --  21*  GLUCOSE 86 110* 152*  --  171*  BUN 18 13 12   --  13  CREATININE 1.11 1.05 1.08  --  1.15  CALCIUM 8.6* 8.4* 8.4*  --  8.5*  MG 1.9 1.9  --   --   --   PHOS  --  2.0*  --  4.1  --    Liver Function Tests: Recent Labs  Lab 07/07/23 1626 07/09/23 0955  AST 30 22  ALT 27 21  ALKPHOS 78 58  BILITOT 0.8 0.9  PROT 8.1 6.0*  ALBUMIN 4.0 2.9*   CBG: No results for input(s): "GLUCAP" in the last 168 hours.  Discharge time spent: {LESS THAN/GREATER HQIO:96295} 30 minutes.  Signed: Hughie Closs, MD [facility] 07/11/2023

## 2023-07-11 NOTE — TOC Progression Note (Addendum)
 Transition of Care [facility]) - Progression Note    Patient Details  Name: Ruben [name] MRN: [id number] Date of Birth: 15-Apr-1950  [facility] CM/SW Contact  Janae Bridgeman, RN Phone Number: 07/11/2023, 4:10 PM  Clinical Narrative:    CM spoke with PT and the patient will need a Rolator for home.  Patient was provided with Medicare choice regarding DME and the patient states that he already has a RW at the home and declines me ordering a rolator.   Expected Discharge Plan: Home/Self Care Barriers to Discharge: Continued Medical Work up  Expected Discharge Plan and Services                                               Social Determinants of Health (SDOH) Interventions SDOH Screenings   Food Insecurity: No Food Insecurity (07/07/2023)  Housing: Patient Declined (07/07/2023)  Transportation Needs: No Transportation Needs (07/07/2023)  Utilities: Not At Risk (07/07/2023)  Social Connections: Socially Isolated (07/08/2023)  Tobacco Use: Low Risk  (07/08/2023)    Readmission Risk Interventions    07/09/2023    3:45 PM 03/23/2023   10:01 AM 02/27/2023    4:15 PM  Readmission Risk Prevention Plan  Transportation Screening Complete Complete Complete  PCP or Specialist Appt within 3-5 Days  Complete Complete  HRI or Home Care Consult  Complete Complete  Social Work Consult for Recovery Care Planning/Counseling  Complete Complete  Palliative Care Screening  Not Applicable Not Applicable  Medication Review [employer]) Complete Complete Complete  PCP or Specialist appointment within 3-5 days of discharge Complete    HRI or Home Care Consult Complete    SW Recovery Care/Counseling Consult Complete    Palliative Care Screening Not Applicable    Skilled Nursing Facility Not Applicable

## 2023-07-11 NOTE — Progress Notes (Signed)
    Durable Medical Equipment  (From admission, onward)           Start     Ordered   07/11/23 1608  For home use only DME 4 wheeled rolling walker with seat  Once       Question:  Patient needs a walker to treat with the following condition  Answer:  Generalized weakness   07/11/23 1608

## 2023-07-11 NOTE — Progress Notes (Addendum)
 PROGRESS NOTE    Ruben [name]  JXB:[id number] DOB: 1950-07-05 DOA: 07/07/2023 PCP: Soundra Pilon, FNP   Brief Narrative:  Ruben [name] is a 73 y.o. male with hx of alcohol use disorder, hypertension, hyperlipidemia, BPH, mood disorder,  who was  was brought in by EMS after neighbors called for wellness check because they had not seen him moving around the house as he normally does.  Patient acknowledges heavy alcohol use ever since retiring in 2020.  Drinking approximately 700 mL of liquor per day.  No history of seizure or DTs per his knowledge.  He is interested in quitting drinking.  He feels depressed but has no suicidal ideation.  Has tried acamprosate but not currently taking.  Also previously on naltrexone but states he "did not give it a good go".  Otherwise has nausea but no vomiting.  No other recent illness.  Very bothered by BPH symptoms.  Was followed by urology outpatient.  Assessment & Plan:   Principal Problem:   Alcohol withdrawal (HCC) Active Problems:   Alcoholic ketoacidosis   UTI (urinary tract infection)  Alcohol use disorder/alcohol withdrawal: Admits to drinking around 700 mL of liquor each day, last drink on 07/07/2023.  EtOH 215 upon arrival.  CIWA under 5 and has not required any as needed Ativan since admission.  Will continue Librium tapering protocol along with as needed Ativan, multivitamin and folate.  Since patient is still within 3 to 5-day window for withdrawal and thus at high risk so we will continue inpatient management for next 1 to 2 days.  Patient appears very weak, I asked RN to walk him, per reports, with walking, he felt and appeared very weak and had tachycardia with heart rate up to 140.  Asking PT to reassess him.  Continue Librium tapering protocol which will and tomorrow.  Wheezing/dyspnea: Patient was noted to have audible wheezes, complaint of shortness of breath but not hypoxic.  CBC with no leukocytosis.  He is afebrile.  Checking chest  x-ray, could be either pneumonia or atelectasis.  Order incentive telemetry, RN advised to provide 1.  Alcoholic/high anion gap metabolic and starvation ketosis/dehydration: Bicarb 12 upon arrival which has improved with IV fluids.  UTI ruled out: Patient's symptoms are more consistent with BPH.  No dysuria.  UA is not impressive for UTI.  Rocephin discontinued.  Hypophosphatemia: Replenished and resolved.  Essential hypertension: Controlled.  Continue home medications which is clonidine 0.1 mg twice daily and metoprolol 50 mg twice daily.  Hyperlipidemia: Continue rosuvastatin.  BPH: Continue Flomax and finasteride and follow-up with urology as outpatient.  Mood disorder: Continue home dose of Seroquel and follow-up with psychiatry as outpatient.  Does not have any suicidal thoughts or plans.  Generalized weakness: Appears much weaker than 2 days ago when he was assessed by PT OT.  Wonder if he is going to require SNF discharge.  Not ready for discharge with tachycardia with minimal mobility today.  DVT prophylaxis: enoxaparin (LOVENOX) injection 40 mg Start: 07/08/23 1000   Code Status: Full Code  Family Communication:  None present at bedside.  Plan of care discussed with patient in length and he/she verbalized understanding and agreed with it.  Status is: Inpatient Remains inpatient appropriate because: Very weak, needs reassessment by PT OT, tachycardic and dyspneic, not ready for discharge.  Estimated body mass index is 29.98 kg/m as calculated from the following:   Height as of this encounter: 5\' 11"  (1.803 m).   Weight as of  this encounter: 97.5 kg.    Nutritional Assessment: Body mass index is 29.98 kg/m.Marland Kitchen Seen by dietician.  I agree with the assessment and plan as outlined below: Nutrition Status:        . Skin Assessment: I have examined the patient's skin and I agree with the wound assessment as performed by the wound care RN as outlined below:    Consultants:   None  Procedures:  None  Antimicrobials:  Anti-infectives (From admission, onward)    Start     Dose/Rate Route Frequency Ordered Stop   07/08/23 0600  cefTRIAXone (ROCEPHIN) 1 g in sodium chloride 0.9 % 100 mL IVPB  Status:  Discontinued        1 g 200 mL/hr over 30 Minutes Intravenous Every 24 hours 07/08/23 0227 07/08/23 4132         Subjective: Seen and examined.  He was sleeping but easily arousable.  He had audible wheezes when he woke up.  No complaints other than mild shortness of breath and that to when I asked specific and direct question.  Objective: Vitals:   07/10/23 2133 07/10/23 2237 07/11/23 0306 07/11/23 0832  BP: (!) 140/77 (!) 140/77 105/70 131/79  Pulse: 65  67 74  Resp: 18  18 16   Temp: 97.8 F (36.6 C)  97.8 F (36.6 C) 98 F (36.7 C)  TempSrc:   Oral   SpO2: 95%  93% 97%  Weight:      Height:        Intake/Output Summary (Last 24 hours) at 07/11/2023 1246 Last data filed at 07/10/2023 2325 Gross per 24 hour  Intake 500 ml  Output 900 ml  Net -400 ml   Filed Weights   07/07/23 1552 07/07/23 2336  Weight: 97 kg 97.5 kg    Examination:  General exam: Appears calm but very weak  Respiratory system: Audible wheezes with poor inspiratory effort.  Rhonchi at the left base.   Cardiovascular system: S1 & S2 heard, RRR. No JVD, murmurs, rubs, gallops or clicks. No pedal edema. Gastrointestinal system: Abdomen is nondistended, soft and nontender. No organomegaly or masses felt. Normal bowel sounds heard. Central nervous system: Alert and oriented. No focal neurological deficits. Extremities: Symmetric 5 x 5 power. Skin: No rashes, lesions or ulcers.   Data Reviewed: I have personally reviewed following labs and imaging studies  CBC: Recent Labs  Lab 07/07/23 1626 07/08/23 0132 07/11/23 1118  WBC 9.2 6.2 6.7  NEUTROABS  --   --  4.8  HGB 16.4 15.4 15.0  HCT 48.7 46.2 45.2  MCV 85.7 85.4 85.6  PLT 272 224 154   Basic Metabolic  Panel: Recent Labs  Lab 07/07/23 1626 07/08/23 0132 07/09/23 0955 07/10/23 0456 07/11/23 1118  NA 137 136 138  --  137  K 4.5 4.4 3.8  --  3.8  CL 102 106 106  --  103  CO2 12* 16* 22  --  21*  GLUCOSE 86 110* 152*  --  171*  BUN 18 13 12   --  13  CREATININE 1.11 1.05 1.08  --  1.15  CALCIUM 8.6* 8.4* 8.4*  --  8.5*  MG 1.9 1.9  --   --   --   PHOS  --  2.0*  --  4.1  --    GFR: Estimated Creatinine Clearance: 70.2 mL/min (by C-G formula based on SCr of 1.15 mg/dL). Liver Function Tests: Recent Labs  Lab 07/07/23 1626 07/09/23 0955  AST 30 22  ALT 27 21  ALKPHOS 78 58  BILITOT 0.8 0.9  PROT 8.1 6.0*  ALBUMIN 4.0 2.9*   Recent Labs  Lab 07/07/23 1626  LIPASE 23   No results for input(s): "AMMONIA" in the last 168 hours. Coagulation Profile: No results for input(s): "INR", "PROTIME" in the last 168 hours. Cardiac Enzymes: No results for input(s): "CKTOTAL", "CKMB", "CKMBINDEX", "TROPONINI" in the last 168 hours. BNP (last 3 results) No results for input(s): "PROBNP" in the last 8760 hours. HbA1C: No results for input(s): "HGBA1C" in the last 72 hours. CBG: No results for input(s): "GLUCAP" in the last 168 hours. Lipid Profile: No results for input(s): "CHOL", "HDL", "LDLCALC", "TRIG", "CHOLHDL", "LDLDIRECT" in the last 72 hours. Thyroid Function Tests: No results for input(s): "TSH", "T4TOTAL", "FREET4", "T3FREE", "THYROIDAB" in the last 72 hours. Anemia Panel: No results for input(s): "VITAMINB12", "FOLATE", "FERRITIN", "TIBC", "IRON", "RETICCTPCT" in the last 72 hours. Sepsis Labs: Recent Labs  Lab 07/08/23 0132  LATICACIDVEN 1.6    Recent Results (from the past 240 hours)  Urine Culture     Status: Abnormal   Collection Time: 07/08/23  1:35 AM   Specimen: Urine, Clean Catch  Result Value Ref Range Status   Specimen Description URINE, CLEAN CATCH  Final   Special Requests   Final    NONE Performed at [facility], [address]  [place], [place] [address]    Culture >=100,000 COLONIES/mL STAPHYLOCOCCUS EPIDERMIDIS (A)  Final   Report Status 07/10/2023 FINAL  Final   Organism ID, Bacteria STAPHYLOCOCCUS EPIDERMIDIS (A)  Final      Susceptibility   Staphylococcus epidermidis - MIC*    CIPROFLOXACIN <=0.5 SENSITIVE Sensitive     GENTAMICIN <=0.5 SENSITIVE Sensitive     NITROFURANTOIN <=16 SENSITIVE Sensitive     OXACILLIN >=4 RESISTANT Resistant     TETRACYCLINE 2 SENSITIVE Sensitive     VANCOMYCIN 2 SENSITIVE Sensitive     TRIMETH/SULFA 20 SENSITIVE Sensitive     RIFAMPIN <=0.5 SENSITIVE Sensitive     Inducible Clindamycin NEGATIVE Sensitive     * >=100,000 COLONIES/mL STAPHYLOCOCCUS EPIDERMIDIS     Radiology Studies: No results found.   Scheduled Meds:  chlordiazePOXIDE  25 mg Oral Q12H   cloNIDine  0.1 mg Oral BID   docusate sodium  100 mg Oral Daily   enoxaparin (LOVENOX) injection  40 mg Subcutaneous Q24H   finasteride  5 mg Oral Daily   folic acid  1 mg Oral Daily   gabapentin  300 mg Oral BID   metoprolol tartrate  50 mg Oral BID   multivitamin with minerals  1 tablet Oral Daily   QUEtiapine  300 mg Oral QHS   rosuvastatin  20 mg Oral Q M,W,F   sodium chloride flush  3 mL Intravenous Q12H   tamsulosin  0.4 mg Oral QHS   thiamine  100 mg Oral Daily   Or   thiamine  100 mg Intravenous Daily   Continuous Infusions:     LOS: 3 days   Hughie Closs, MD [facility]  07/11/2023, 12:46 PM   *Please note that this is a verbal dictation therefore any spelling or grammatical errors are due to the "Dragon [employer] One" system interpretation.  Please page via Amion and do not message via secure chat for urgent patient care matters. Secure chat can be used for non urgent patient care matters.  How to contact the [facility] Attending or Consulting provider 7A - 7P or covering provider during after  hours 7P -7A, for this patient?  Check the care team in [facility] and look for a) attending/consulting [facility] provider  listed and b) the [facility] team listed. Page or secure chat 7A-7P. Log into [email or web address] and use [place]'s universal password to access. If you do not have the password, please contact the hospital operator. Locate the [facility] provider you are looking for under [facility] and page to a number that you can be directly reached. If you still have difficulty reaching the provider, please page the [facility] (Director on Call) for the Hospitalists listed on amion for assistance.

## 2023-07-11 NOTE — Care Management Important Message (Signed)
 Important Message  Patient Details  Name: Ruben [name] MRN: [id number] Date of Birth: 1950-07-19   Important Message Given:  Yes - Medicare IM     Dorena Bodo 07/11/2023, 2:58 PM

## 2023-07-11 NOTE — Progress Notes (Signed)
 Physical Therapy Treatment Patient Details Name: Ruben [name] MRN: [id number] DOB: Mar 25, 1951 Today's Date: 07/11/2023   History of Present Illness 73 y.o. male admitted 3/29 with Alcohol use disorder/alcohol withdrawal, Alcoholic/high anion gap metabolic and starvation ketosis/dehydration. PMHx: alcohol use disorder, hypertension, hyperlipidemia, BPH, mood disorder.    PT Comments  Pt received in supine, agreeable to therapy session and with good participation and tolerance for longer household distance gait trial and porch step negotiation. Pt needing increased assist, up to modA today to ascend/descend 7" step with only HHA (unclear if he is going to his house with step upon DC or cousin's home). Pt Supervision for gait and transfers and Independent with bed mobility, making good progress toward his goals. Of note, pt with mildly symptomatic orthostatic hypotension upon standing (pt reports only symptoms is UE/LE fatigue) but after standing exercise ~3 mins, BP more stable. DME recommendations updated to recommend rollator which may be helpful for energy conservation with orthostatic hypotension and pt was steadier using device, but may need some supervision (from Cousin who may help him?) to remind him to use brakes. Discussed DME update with supervising PT [facility]. Pt continues to benefit from PT services to progress toward functional mobility goals.    If plan is discharge home, recommend the following: Supervision due to cognitive status;Help with stairs or ramp for entrance;Assist for transportation   Can travel by private vehicle        Equipment Recommendations  Rollator (4 wheels)    Recommendations for Other Services       Precautions / Restrictions Precautions Precautions: Fall Recall of Precautions/Restrictions: Intact Precaution/Restrictions Comments: watch BP, orthostatic but only mild symptoms Restrictions Weight Bearing Restrictions Per Provider Order: No     Mobility   Bed Mobility Overal bed mobility: Independent Bed Mobility: Supine to Sit, Sit to Supine     Supine to sit: Independent Sit to supine: Independent   General bed mobility comments: no assist, flat bed/no rails    Transfers Overall transfer level: Needs assistance Equipment used: None, Rollator (4 wheels) Transfers: Sit to/from Stand Sit to Stand: Supervision           General transfer comment: Stands without support, slightly decreased eccentric control to sit. Pt reminded to reach back when sitting for safety, receptive. No lightheadedness when standing but found to be orthsotatic when checked (pt does c/o UE/LE fatigue sensation). Supervision for lateral scooting on EOB toward HOB. Cues for rollator brakes prior to sitting/standing when using 4WW for portion of session, (needs reminders)    Ambulation/Gait Ambulation/Gait assistance: Supervision Gait Distance (Feet): 350 Feet Assistive device: Rollator (4 wheels), None Gait Pattern/deviations: Step-through pattern, Decreased stride length, Ataxic Gait velocity: dec     General Gait Details: Minor drift without loss of balance today while challenged by dynamic tasks. Mild instability without AD but able to self correct . No buckling. feels close to baseline. Pt with improved upright posture and cadence using rollator for first 254ft, needs reminders for use of brakes. Pt defers need for seated rest break. 66ft without AD and Supervision after rollator taken away.   Stairs Stairs: Yes Stairs assistance: Mod assist Stair Management: One rail Right, Step to pattern, Forwards, Backwards Number of Stairs: 1 General stair comments: Pt with difficulty flexing L and R hip enough to place foot on step with only LUE HHA, so pt used rollator handle as railing with RUE. Pt ascends forward/backward descends single 7" platform step in hallway to simulate home entry.  Pt states his porch step at home is not this high and that he has bil  rails, but per google maps the address listed ([address]) does not have rails and appears standard height 5-7". Pt may have been referring to entry way for his cousin Candy's home.   Wheelchair Mobility     Tilt Bed    Modified Rankin (Stroke Patients Only)       Balance Overall balance assessment: Mild deficits observed, not formally tested                                          Communication Communication Communication: No apparent difficulties  Cognition Arousal: Alert Behavior During Therapy: WFL for tasks assessed/performed   PT - Cognitive impairments: No family/caregiver present to determine baseline, Sequencing, Memory                       PT - Cognition Comments: Pt unable to recall how long ago he got RW or where he got it. Pt states he does not use it, and does not have a rollator. Pt has difficulty describing physical sensation when orthostatic (just c/o fatigue) but was + for orthostatic hypotension, RN/MD notified. Following commands: Intact      Cueing Cueing Techniques: Verbal cues  Exercises Other Exercises Other Exercises: standing hip flexion x30 reps ea leg without AD (supervision)    General Comments General comments (skin integrity, edema, etc.): mildly symptomatic orthostatic hypotension, see comments above, RN/MD notified.      Pertinent Vitals/Pain Pain Assessment Pain Assessment: No/denies pain    Home Living                          Prior Function            PT Goals (current goals can now be found in the care plan section) Acute Rehab PT Goals Patient Stated Goal: Get out of hospital, stay with cousin Candy PT Goal Formulation: With patient Time For Goal Achievement: 07/23/23 Progress towards PT goals: Progressing toward goals    Frequency    Min 2X/week      PT Plan      Co-evaluation              AM-PAC PT "6 Clicks" Mobility   Outcome Measure  Help needed turning  from your back to your side while in a flat bed without using bedrails?: None Help needed moving from lying on your back to sitting on the side of a flat bed without using bedrails?: None Help needed moving to and from a bed to a chair (including a wheelchair)?: None Help needed standing up from a chair using your arms (e.g., wheelchair or bedside chair)?: A Little (safety cues only) Help needed to walk in hospital room?: A Little Help needed climbing 3-5 steps with a railing? : A Lot (modA with HHA today) 6 Click Score: 20    End of Session Equipment Utilized During Treatment: Gait belt Activity Tolerance: Patient tolerated treatment well;Treatment limited secondary to medical complications (Comment);Other (comment) (mildly symptomatic orthostatic hypotension) Patient left: in bed;with call bell/phone within reach;with bed alarm set;Other (comment) (bed in chair posture (no recliner in his room)) Nurse Communication: Mobility status;Other (comment) (orthostatic BP, may need AD) PT Visit Diagnosis: Other abnormalities of gait and mobility (R26.89);Other symptoms and signs involving  the nervous system (R29.898)     Time: 1610-9604 PT Time Calculation (min) (ACUTE ONLY): 23 min  Charges:    $Gait Training: 8-22 mins $Therapeutic Activity: 8-22 mins PT General Charges $$ ACUTE PT VISIT: 1 Visit                     Blanchie Zeleznik P., PTA Acute Rehabilitation Services Secure Chat Preferred 9a-5:30pm Office: [phone]    Dorathy Kinsman [facility] 07/11/2023, 3:57 PM

## 2023-07-12 DIAGNOSIS — F10930 Alcohol use, unspecified with withdrawal, uncomplicated: Secondary | ICD-10-CM | POA: Diagnosis not present

## 2023-07-12 NOTE — Progress Notes (Signed)
 Explained discharge instructions to patient. Reviewed follow up appointment and next medication administration times. Also reviewed education. Patient verbalized having an understanding for instructions given. All belongings are in the patient's possession. IV was removed by unit staff. No other needs verbalized. Transporting downstairs to the discharge lounge to await uber.

## 2023-07-12 NOTE — Plan of Care (Signed)
  Problem: Acute Rehab PT Goals(only PT should resolve) Goal: Pt Will Ambulate Outcome: Adequate for Discharge Goal: Pt Will Go Up/Down Stairs Outcome: Adequate for Discharge   Problem: Acute Rehab OT Goals (only OT should resolve) Goal: Pt. Will Perform Lower Body Dressing Outcome: Adequate for Discharge Goal: Pt. Will Perform Tub/Shower Transfer Outcome: Adequate for Discharge Goal: OT Additional ADL Goal #1 Outcome: Adequate for Discharge

## 2023-07-19 DIAGNOSIS — F10929 Alcohol use, unspecified with intoxication, unspecified: Secondary | ICD-10-CM | POA: Diagnosis not present

## 2023-07-19 DIAGNOSIS — R410 Disorientation, unspecified: Secondary | ICD-10-CM | POA: Diagnosis not present

## 2023-07-19 DIAGNOSIS — F10129 Alcohol abuse with intoxication, unspecified: Secondary | ICD-10-CM | POA: Diagnosis not present

## 2023-07-19 DIAGNOSIS — F1010 Alcohol abuse, uncomplicated: Secondary | ICD-10-CM | POA: Diagnosis not present

## 2023-07-19 DIAGNOSIS — Z781 Physical restraint status: Secondary | ICD-10-CM | POA: Diagnosis not present

## 2023-07-20 DIAGNOSIS — R000 Tachycardia, unspecified: Secondary | ICD-10-CM | POA: Diagnosis not present

## 2023-07-31 DIAGNOSIS — F10239 Alcohol dependence with withdrawal, unspecified: Secondary | ICD-10-CM | POA: Diagnosis not present

## 2023-07-31 DIAGNOSIS — F10229 Alcohol dependence with intoxication, unspecified: Secondary | ICD-10-CM | POA: Diagnosis not present

## 2023-08-01 DIAGNOSIS — F1320 Sedative, hypnotic or anxiolytic dependence, uncomplicated: Secondary | ICD-10-CM | POA: Diagnosis not present

## 2023-08-01 DIAGNOSIS — G312 Degeneration of nervous system due to alcohol: Secondary | ICD-10-CM | POA: Diagnosis not present

## 2023-08-01 DIAGNOSIS — F10229 Alcohol dependence with intoxication, unspecified: Secondary | ICD-10-CM | POA: Diagnosis not present

## 2023-08-01 DIAGNOSIS — J90 Pleural effusion, not elsewhere classified: Secondary | ICD-10-CM | POA: Diagnosis not present

## 2023-08-01 DIAGNOSIS — E785 Hyperlipidemia, unspecified: Secondary | ICD-10-CM | POA: Diagnosis not present

## 2023-08-01 DIAGNOSIS — F10929 Alcohol use, unspecified with intoxication, unspecified: Secondary | ICD-10-CM | POA: Diagnosis not present

## 2023-08-01 DIAGNOSIS — I10 Essential (primary) hypertension: Secondary | ICD-10-CM | POA: Diagnosis not present

## 2023-08-01 DIAGNOSIS — J9811 Atelectasis: Secondary | ICD-10-CM | POA: Diagnosis not present

## 2023-08-01 DIAGNOSIS — Z91128 Patient's intentional underdosing of medication regimen for other reason: Secondary | ICD-10-CM | POA: Diagnosis not present

## 2023-08-01 DIAGNOSIS — F10239 Alcohol dependence with withdrawal, unspecified: Secondary | ICD-10-CM | POA: Diagnosis not present

## 2023-08-01 DIAGNOSIS — G4700 Insomnia, unspecified: Secondary | ICD-10-CM | POA: Diagnosis not present

## 2023-08-06 DIAGNOSIS — I10 Essential (primary) hypertension: Secondary | ICD-10-CM | POA: Diagnosis not present

## 2023-08-06 DIAGNOSIS — F10929 Alcohol use, unspecified with intoxication, unspecified: Secondary | ICD-10-CM | POA: Diagnosis not present

## 2023-08-06 DIAGNOSIS — E785 Hyperlipidemia, unspecified: Secondary | ICD-10-CM | POA: Diagnosis not present

## 2023-08-06 DIAGNOSIS — F1320 Sedative, hypnotic or anxiolytic dependence, uncomplicated: Secondary | ICD-10-CM | POA: Diagnosis not present

## 2023-08-10 DIAGNOSIS — I10 Essential (primary) hypertension: Secondary | ICD-10-CM | POA: Diagnosis not present

## 2023-08-10 DIAGNOSIS — F1020 Alcohol dependence, uncomplicated: Secondary | ICD-10-CM | POA: Diagnosis not present

## 2023-08-10 DIAGNOSIS — E785 Hyperlipidemia, unspecified: Secondary | ICD-10-CM | POA: Diagnosis not present

## 2023-08-14 DIAGNOSIS — F1020 Alcohol dependence, uncomplicated: Secondary | ICD-10-CM | POA: Diagnosis not present

## 2023-08-14 DIAGNOSIS — I10 Essential (primary) hypertension: Secondary | ICD-10-CM | POA: Diagnosis not present

## 2023-08-14 DIAGNOSIS — E785 Hyperlipidemia, unspecified: Secondary | ICD-10-CM | POA: Diagnosis not present

## 2023-08-21 DIAGNOSIS — I10 Essential (primary) hypertension: Secondary | ICD-10-CM | POA: Diagnosis not present

## 2023-08-21 DIAGNOSIS — F1020 Alcohol dependence, uncomplicated: Secondary | ICD-10-CM | POA: Diagnosis not present

## 2023-08-21 DIAGNOSIS — F332 Major depressive disorder, recurrent severe without psychotic features: Secondary | ICD-10-CM | POA: Diagnosis not present

## 2023-08-21 DIAGNOSIS — E785 Hyperlipidemia, unspecified: Secondary | ICD-10-CM | POA: Diagnosis not present

## 2023-08-31 ENCOUNTER — Other Ambulatory Visit: Payer: Self-pay

## 2023-08-31 ENCOUNTER — Emergency Department (HOSPITAL_COMMUNITY)

## 2023-08-31 ENCOUNTER — Emergency Department (HOSPITAL_COMMUNITY)
Admission: EM | Admit: 2023-08-31 | Discharge: 2023-08-31 | Disposition: A | Discharge status: Home / Self Care | Attending: Emergency Medicine | Admitting: Emergency Medicine

## 2023-08-31 DIAGNOSIS — F1090 Alcohol use, unspecified, uncomplicated: Secondary | ICD-10-CM

## 2023-08-31 DIAGNOSIS — R1084 Generalized abdominal pain: Secondary | ICD-10-CM | POA: Diagnosis not present

## 2023-08-31 DIAGNOSIS — F1010 Alcohol abuse, uncomplicated: Secondary | ICD-10-CM | POA: Diagnosis not present

## 2023-08-31 DIAGNOSIS — K279 Peptic ulcer, site unspecified, unspecified as acute or chronic, without hemorrhage or perforation: Secondary | ICD-10-CM | POA: Insufficient documentation

## 2023-08-31 DIAGNOSIS — K5900 Constipation, unspecified: Secondary | ICD-10-CM | POA: Diagnosis not present

## 2023-08-31 DIAGNOSIS — I517 Cardiomegaly: Secondary | ICD-10-CM | POA: Diagnosis not present

## 2023-08-31 DIAGNOSIS — R109 Unspecified abdominal pain: Secondary | ICD-10-CM | POA: Diagnosis not present

## 2023-08-31 DIAGNOSIS — R0989 Other specified symptoms and signs involving the circulatory and respiratory systems: Secondary | ICD-10-CM | POA: Diagnosis not present

## 2023-08-31 LAB — COMPREHENSIVE METABOLIC PANEL WITH GFR
ALT: 38 U/L (ref 0–44)
AST: 34 U/L (ref 15–41)
Albumin: 4.2 g/dL (ref 3.5–5.0)
Alkaline Phosphatase: 75 U/L (ref 38–126)
Anion gap: 13 (ref 5–15)
BUN: 12 mg/dL (ref 8–23)
CO2: 26 mmol/L (ref 22–32)
Calcium: 8.6 mg/dL — ABNORMAL LOW (ref 8.9–10.3)
Chloride: 102 mmol/L (ref 98–111)
Creatinine, Ser: 0.88 mg/dL (ref 0.61–1.24)
GFR, Estimated: >60 mL/min (ref >60)
Glucose, Bld: 126 mg/dL — ABNORMAL HIGH (ref 70–99)
Potassium: 4.2 mmol/L (ref 3.5–5.1)
Sodium: 141 mmol/L (ref 135–145)
Total Bilirubin: 0.5 mg/dL (ref 0.0–1.2)
Total Protein: 8 g/dL (ref 6.5–8.1)

## 2023-08-31 LAB — CBC WITH DIFFERENTIAL/PLATELET
Abs Immature Granulocytes: 0.02 10*3/uL (ref 0.00–0.07)
Basophils Absolute: 0.1 10*3/uL (ref 0.0–0.1)
Basophils Relative: 1 %
Eosinophils Absolute: 0.1 10*3/uL (ref 0.0–0.5)
Eosinophils Relative: 2 %
HCT: 48.5 % (ref 39.0–52.0)
Hemoglobin: 15.7 g/dL (ref 13.0–17.0)
Immature Granulocytes: 0 %
Lymphocytes Relative: 18 %
Lymphs Abs: 1.4 10*3/uL (ref 0.7–4.0)
MCH: 27.7 pg (ref 26.0–34.0)
MCHC: 32.4 g/dL (ref 30.0–36.0)
MCV: 85.5 fL (ref 80.0–100.0)
Monocytes Absolute: 0.6 10*3/uL (ref 0.1–1.0)
Monocytes Relative: 8 %
Neutro Abs: 5.3 10*3/uL (ref 1.7–7.7)
Neutrophils Relative %: 71 %
Platelets: 158 10*3/uL (ref 150–400)
RBC: 5.67 MIL/uL (ref 4.22–5.81)
RDW: 15.3 % (ref 11.5–15.5)
WBC: 7.4 10*3/uL (ref 4.0–10.5)
nRBC: 0 % (ref 0.0–0.2)

## 2023-08-31 LAB — LIPASE, BLOOD: Lipase: 29 U/L (ref 11–51)

## 2023-08-31 MED ORDER — LORAZEPAM 1 MG PO TABS
2.0000 mg | ORAL_TABLET | Freq: Once | ORAL | Status: AC
  Administered 2023-08-31: 2 mg via ORAL
  Filled 2023-08-31: qty 2

## 2023-08-31 MED ORDER — LORAZEPAM 2 MG/ML IJ SOLN
0.0000 mg | Freq: Four times a day (QID) | INTRAMUSCULAR | Status: DC
  Administered 2023-08-31: 2 mg via INTRAVENOUS
  Filled 2023-08-31: qty 1

## 2023-08-31 MED ORDER — FENTANYL CITRATE PF 50 MCG/ML IJ SOSY
50.0000 ug | PREFILLED_SYRINGE | Freq: Once | INTRAMUSCULAR | Status: AC
  Administered 2023-08-31: 50 ug via INTRAVENOUS
  Filled 2023-08-31: qty 1

## 2023-08-31 MED ORDER — THIAMINE MONONITRATE 100 MG PO TABS
100.0000 mg | ORAL_TABLET | Freq: Every day | ORAL | Status: DC
  Administered 2023-08-31: 100 mg via ORAL
  Filled 2023-08-31: qty 1

## 2023-08-31 MED ORDER — LORAZEPAM 1 MG PO TABS: 0.0000 mg | ORAL_TABLET | Freq: Four times a day (QID) | ORAL | Status: DC

## 2023-08-31 MED ORDER — LORAZEPAM 1 MG PO TABS: 0.0000 mg | ORAL_TABLET | Freq: Two times a day (BID) | ORAL | Status: DC

## 2023-08-31 MED ORDER — MAGNESIUM CITRATE PO SOLN
1.0000 | Freq: Once | ORAL | Status: AC
  Administered 2023-08-31: 1 via ORAL
  Filled 2023-08-31: qty 296

## 2023-08-31 MED ORDER — OMEPRAZOLE 20 MG PO CPDR: 20.0000 mg | DELAYED_RELEASE_CAPSULE | Freq: Two times a day (BID) | ORAL | 0 refills | Status: DC

## 2023-08-31 MED ORDER — SUCRALFATE 1 G PO TABS: 1.0000 g | ORAL_TABLET | Freq: Two times a day (BID) | ORAL | 0 refills | Status: DC

## 2023-08-31 MED ORDER — POLYETHYLENE GLYCOL 3350 17 G PO PACK: 17.0000 g | PACK | Freq: Every day | ORAL | 0 refills | Status: DC

## 2023-08-31 MED ORDER — LACTATED RINGERS IV BOLUS
1000.0000 mL | Freq: Once | INTRAVENOUS | Status: AC
  Administered 2023-08-31: 1000 mL via INTRAVENOUS

## 2023-08-31 MED ORDER — THIAMINE HCL 100 MG/ML IJ SOLN: 100.0000 mg | Freq: Every day | INTRAMUSCULAR | Status: DC

## 2023-08-31 MED ORDER — LORAZEPAM 2 MG/ML IJ SOLN: 0.0000 mg | Freq: Two times a day (BID) | INTRAMUSCULAR | Status: DC

## 2023-08-31 MED ORDER — PANTOPRAZOLE SODIUM 40 MG IV SOLR
40.0000 mg | Freq: Once | INTRAVENOUS | Status: AC
  Administered 2023-08-31: 40 mg via INTRAVENOUS
  Filled 2023-08-31: qty 10

## 2023-08-31 MED ORDER — SUCRALFATE 1 G PO TABS
1.0000 g | ORAL_TABLET | Freq: Once | ORAL | Status: AC
  Administered 2023-08-31: 1 g via ORAL
  Filled 2023-08-31: qty 1

## 2023-08-31 NOTE — ED Provider Notes (Signed)
 Ruben [name] EMERGENCY DEPARTMENT AT [facility] Provider Note   CSN: [id number] Arrival date & time: 08/31/23  4098     History  Chief Complaint  Patient presents with   Abdominal Pain   Alcohol  Intoxication    Ruben [name] is a 73 y.o. male.  HPI    73 year old male comes in with chief complaint of abdominal pain, constipation.  Patient has history of alcohol  use disorder, GERD, alcoholic liver disease.  Patient comes in the ER because he has not had a bowel movement in 4 to 5 days.  Additionally he is having generalized abdominal pain.  Pain to him is different than his pancreatitis pain.  Pain is diffuse, worse over the upper quadrants and it is nonradiating.  Patient denies any vomiting, but has been nauseous.  He denies any abdominal surgeries.  Home Medications Prior to Admission medications   Medication Sig Start Date End Date Taking? Authorizing Provider  lactulose  (CHRONULAC ) 10 GM/15ML solution SMARTSIG:Milliliter(s) By Mouth 08/24/23  Yes [provider]  mirtazapine  (REMERON ) 7.5 MG tablet Take 7.5 mg by mouth at bedtime. 08/22/23  Yes [provider]  omeprazole (PRILOSEC) 20 MG capsule Take 1 capsule (20 mg total) by mouth 2 (two) times daily before a meal. 08/31/23  Yes Aleya Durnell, MD  sucralfate (CARAFATE) 1 g tablet Take 1 tablet (1 g total) by mouth 2 (two) times daily before a meal. 08/31/23  Yes Carmel Garfield, MD  albuterol  (VENTOLIN  HFA) 108 (90 Base) MCG/ACT inhaler Inhale 2 puffs into the lungs every 6 (six) hours as needed. Patient not taking: Reported on 07/09/2023 05/24/23   [provider]  cloNIDine  (CATAPRES ) 0.1 MG tablet Take 1 tablet (0.1 mg total) by mouth 2 (two) times daily. Patient taking differently: Take 0.1 mg by mouth at bedtime. 02/04/23   Gonfa, Taye T, MD  desvenlafaxine (PRISTIQ) 50 MG 24 hr tablet Take 50 mg by mouth daily. Patient not taking: Reported on 07/09/2023 03/05/23   [provider]  finasteride  (PROSCAR ) 5 MG tablet Take 5 mg by mouth at bedtime.    [provider]  folic acid  (FOLVITE ) 1 MG tablet Take 1 tablet (1 mg total) by mouth daily. 03/01/23   Krishnan, Gokul, MD  gabapentin  (NEURONTIN ) 300 MG capsule Take 300 mg by mouth at bedtime.    [provider]  hydrOXYzine  (ATARAX ) 50 MG tablet Take 50 mg by mouth at bedtime.    [provider]  magnesium  hydroxide (MILK OF MAGNESIA) 400 MG/5ML suspension Take 30 mLs by mouth every other day.    [provider]  metoprolol  tartrate (LOPRESSOR ) 50 MG tablet Take 50 mg by mouth 2 (two) times daily.    [provider]  oxybutynin  (DITROPAN ) 5 MG tablet Take 1 tablet (5 mg total) by mouth 2 (two) times daily as needed for bladder spasms. Patient not taking: Reported on 07/09/2023 03/27/23   Christina Coyer, MD  QUEtiapine  (SEROQUEL ) 300 MG tablet Take 300 mg by mouth at bedtime. 12/02/22   [provider]  rosuvastatin  (CRESTOR ) 20 MG tablet Take 20 mg by mouth every Monday, Wednesday, and Friday. 08/23/21   [provider]  tamsulosin  (FLOMAX ) 0.4 MG CAPS capsule Take 0.4 mg by mouth in the morning and at bedtime.    [provider]  thiamine  (VITAMIN B-1) 100 MG tablet Take 1 tablet (100 mg total) by mouth daily. 04/13/23   Krishnan, Gokul, MD      Allergies  Patient has no known allergies.    Review of Systems   Review of Systems  All other systems reviewed and are negative.   Physical Exam Updated Vital Signs BP (!) 157/85   Pulse (!) 109   Temp 98.1 F (36.7 C) (Oral)   Resp 20   SpO2 95%  Physical Exam Vitals and nursing note reviewed.  Constitutional:      Appearance: He is well-developed.  HENT:     Head: Atraumatic.  Cardiovascular:     Rate and Rhythm: Normal rate.  Pulmonary:     Effort: Pulmonary effort is normal.  Abdominal:     Tenderness: There is generalized abdominal tenderness. There is no guarding or  rebound.  Musculoskeletal:     Cervical back: Neck supple.  Skin:    General: Skin is warm.  Neurological:     Mental Status: He is alert and oriented to person, place, and time.     ED Results / Procedures / Treatments   Labs (all labs ordered are listed, but only abnormal results are displayed) Labs Reviewed  COMPREHENSIVE METABOLIC PANEL WITH GFR - Abnormal; Notable for the following components:      Result Value   Glucose, Bld 126 (*)    Calcium  8.6 (*)    All other components within normal limits  CBC WITH DIFFERENTIAL/PLATELET  LIPASE, BLOOD  URINALYSIS, ROUTINE W REFLEX MICROSCOPIC    EKG None  Radiology No results found.  Procedures Procedures    Medications Ordered in ED Medications  LORazepam  (ATIVAN ) injection 0-4 mg (2 mg Intravenous Given 08/31/23 0804)    Or  LORazepam  (ATIVAN ) tablet 0-4 mg ( Oral See Alternative 08/31/23 0804)  LORazepam  (ATIVAN ) injection 0-4 mg (has no administration in time range)    Or  LORazepam  (ATIVAN ) tablet 0-4 mg (has no administration in time range)  thiamine  (VITAMIN B1) tablet 100 mg (100 mg Oral Given 08/31/23 0916)    Or  thiamine  (VITAMIN B1) injection 100 mg ( Intravenous See Alternative 08/31/23 0916)  magnesium  citrate solution 1 Bottle (has no administration in time range)  LORazepam  (ATIVAN ) tablet 2 mg (has no administration in time range)  lactated ringers  bolus 1,000 mL (1,000 mLs Intravenous New Bag/Given 08/31/23 0756)  pantoprazole  (PROTONIX ) injection 40 mg (40 mg Intravenous Given 08/31/23 0912)  fentaNYL  (SUBLIMAZE ) injection 50 mcg (50 mcg Intravenous Given 08/31/23 0914)  sucralfate (CARAFATE) tablet 1 g (1 g Oral Given 08/31/23 5621)    ED Course/ Medical Decision Making/ A&P                                 Medical Decision Making Amount and/or Complexity of Data Reviewed Labs: ordered. Radiology: ordered.  Risk OTC drugs. Prescription drug management.   This patient presents to the ED with  chief complaint(s) of abdominal pain with pertinent past medical history of alcohol  use disorder, GERD/esophagitis, hernia.The complaint involves an extensive differential diagnosis and also carries with it a high risk of complications and morbidity.    The differential diagnosis includes : Peptic ulcer disease, ventral hernia related pain, esophageal spasm, esophagitis, pancreatitis, small bowel obstruction, ileus, severe constipation, protal and thrombosis.  The initial plan is to get basic labs. Although he has diffuse abdominal tenderness, there is no peritonitis.  Patient had a CT scan done on 07-07-2023 which did not show any major concerns of portal hypertension or hepatobiliary disease.  He had normal pancreas and  no pseudocyst at this time.  I do not think a repeat CT is needed, unless patient's symptoms worsen.   Additional history obtained: Records reviewed previous admission documents and previous CT scan from March  Independent labs interpretation:  The following labs were independently interpreted: Patient's CBC and CMP are normal.  Lipase is normal.  Independent visualization and interpretation of imaging: - I independently visualized the following imaging with scope of interpretation limited to determining acute life threatening conditions related to emergency care: Acute abdominal series, which revealed no evidence of free air.  Treatment and Reassessment: I had ordered mag citrate for the patient, however nursing staff indicated that patient had a bowel movement in the ER.  They question if he still needs mag citrate.  I requested them to hold the mag citrate.  Patient's acute abdominal series was completed.  There is no free air.  There are no multiple air-fluid levels, but it does appear that he is constipated.  Will give him a dose of mag citrate now.  His vital signs are stable and within normal limits, besides transient tachycardia, that could be because of his withdrawals.   His lab workup is reassuring.  At this time a CT is not indicated.  Patient has passed oral challenge.  He stable for discharge with return precautions.    Final Clinical Impression(s) / ED Diagnoses Final diagnoses:  PUD (peptic ulcer disease)  Constipation, unspecified constipation type  Alcohol  use disorder  Generalized abdominal pain    Rx / DC Orders ED Discharge Orders          Ordered    omeprazole (PRILOSEC) 20 MG capsule  2 times daily before meals        08/31/23 1009    sucralfate (CARAFATE) 1 g tablet  2 times daily before meals        08/31/23 1009              Deatra Face, MD 08/31/23 1009

## 2023-08-31 NOTE — ED Notes (Signed)
 Patient is resting comfortably.

## 2023-08-31 NOTE — ED Notes (Signed)
 Tech Jackson cleaned pt up dt BM in bed. Pt medicated according to [facility]

## 2023-08-31 NOTE — ED Notes (Signed)
 Patient is resting comfortably. Placed on 3LNC for o2 on room air 87%

## 2023-08-31 NOTE — ED Notes (Signed)
 Changed patient again for stooling occurrence with assistance from [facility] tech. Dressed and placed in wheelchair  to be dc

## 2023-08-31 NOTE — Discharge Instructions (Signed)
 You were seen in the ER for constipation and abdominal pain.  The workup in the emergency room does confirm concerns for constipation.  We have prescribed you MiraLAX  for your constipation.  Additionally, we suspect that your pain is because of stomach ulcer or gastritis.  Follow-up with your primary care doctor for it.  Start taking the medications prescribed for it.  Return to the ER if your symptoms get worse.

## 2023-08-31 NOTE — ED Triage Notes (Addendum)
 Pt came in via EMS from home with c/o of abd pain with ETOH on board. Pt reports drinking 3 bottles of kettle one last night. Pt denies N/V/D

## 2023-08-31 NOTE — ED Notes (Signed)
 Asked provider for more pain medication for pts stomach per pt request

## 2023-08-31 NOTE — ED Notes (Signed)
Notified provider that patient is still in pain.

## 2023-09-01 ENCOUNTER — Emergency Department (HOSPITAL_COMMUNITY)

## 2023-09-01 ENCOUNTER — Emergency Department (HOSPITAL_COMMUNITY)
Admission: EM | Admit: 2023-09-01 | Discharge: 2023-09-01 | Disposition: A | Discharge status: Home / Self Care | Attending: Emergency Medicine | Admitting: Emergency Medicine

## 2023-09-01 DIAGNOSIS — I10 Essential (primary) hypertension: Secondary | ICD-10-CM | POA: Diagnosis not present

## 2023-09-01 DIAGNOSIS — R4182 Altered mental status, unspecified: Secondary | ICD-10-CM | POA: Diagnosis not present

## 2023-09-01 DIAGNOSIS — Y908 Blood alcohol level of 240 mg/100 ml or more: Secondary | ICD-10-CM | POA: Insufficient documentation

## 2023-09-01 DIAGNOSIS — E785 Hyperlipidemia, unspecified: Secondary | ICD-10-CM | POA: Insufficient documentation

## 2023-09-01 DIAGNOSIS — Z79899 Other long term (current) drug therapy: Secondary | ICD-10-CM | POA: Insufficient documentation

## 2023-09-01 DIAGNOSIS — N400 Enlarged prostate without lower urinary tract symptoms: Secondary | ICD-10-CM | POA: Diagnosis not present

## 2023-09-01 DIAGNOSIS — F10120 Alcohol abuse with intoxication, uncomplicated: Secondary | ICD-10-CM | POA: Insufficient documentation

## 2023-09-01 DIAGNOSIS — R404 Transient alteration of awareness: Secondary | ICD-10-CM | POA: Diagnosis not present

## 2023-09-01 DIAGNOSIS — F10920 Alcohol use, unspecified with intoxication, uncomplicated: Secondary | ICD-10-CM

## 2023-09-01 DIAGNOSIS — K5900 Constipation, unspecified: Secondary | ICD-10-CM | POA: Insufficient documentation

## 2023-09-01 DIAGNOSIS — F10929 Alcohol use, unspecified with intoxication, unspecified: Secondary | ICD-10-CM | POA: Diagnosis not present

## 2023-09-01 DIAGNOSIS — F10129 Alcohol abuse with intoxication, unspecified: Secondary | ICD-10-CM | POA: Diagnosis not present

## 2023-09-01 DIAGNOSIS — R7303 Prediabetes: Secondary | ICD-10-CM | POA: Diagnosis not present

## 2023-09-01 DIAGNOSIS — R4781 Slurred speech: Secondary | ICD-10-CM | POA: Insufficient documentation

## 2023-09-01 LAB — COMPREHENSIVE METABOLIC PANEL WITH GFR
ALT: 36 U/L (ref 0–44)
AST: 37 U/L (ref 15–41)
Albumin: 3.8 g/dL (ref 3.5–5.0)
Alkaline Phosphatase: 67 U/L (ref 38–126)
Anion gap: 12 (ref 5–15)
BUN: 10 mg/dL (ref 8–23)
CO2: 28 mmol/L (ref 22–32)
Calcium: 8.4 mg/dL — ABNORMAL LOW (ref 8.9–10.3)
Chloride: 101 mmol/L (ref 98–111)
Creatinine, Ser: 0.99 mg/dL (ref 0.61–1.24)
GFR, Estimated: >60 mL/min (ref >60)
Glucose, Bld: 88 mg/dL (ref 70–99)
Potassium: 4.1 mmol/L (ref 3.5–5.1)
Sodium: 141 mmol/L (ref 135–145)
Total Bilirubin: 0.7 mg/dL (ref 0.0–1.2)
Total Protein: 7.5 g/dL (ref 6.5–8.1)

## 2023-09-01 LAB — CBC WITH DIFFERENTIAL/PLATELET
Abs Immature Granulocytes: 0.02 10*3/uL (ref 0.00–0.07)
Basophils Absolute: 0.1 10*3/uL (ref 0.0–0.1)
Basophils Relative: 1 %
Eosinophils Absolute: 0.3 10*3/uL (ref 0.0–0.5)
Eosinophils Relative: 5 %
HCT: 51.5 % (ref 39.0–52.0)
Hemoglobin: 16.3 g/dL (ref 13.0–17.0)
Immature Granulocytes: 0 %
Lymphocytes Relative: 33 %
Lymphs Abs: 1.8 10*3/uL (ref 0.7–4.0)
MCH: 27.8 pg (ref 26.0–34.0)
MCHC: 31.7 g/dL (ref 30.0–36.0)
MCV: 87.7 fL (ref 80.0–100.0)
Monocytes Absolute: 0.4 10*3/uL (ref 0.1–1.0)
Monocytes Relative: 8 %
Neutro Abs: 2.8 10*3/uL (ref 1.7–7.7)
Neutrophils Relative %: 53 %
Platelets: 157 10*3/uL (ref 150–400)
RBC: 5.87 MIL/uL — ABNORMAL HIGH (ref 4.22–5.81)
RDW: 15.4 % (ref 11.5–15.5)
WBC: 5.4 10*3/uL (ref 4.0–10.5)
nRBC: 0 % (ref 0.0–0.2)

## 2023-09-01 LAB — TROPONIN I (HIGH SENSITIVITY): Troponin I (High Sensitivity): 24 ng/L — ABNORMAL HIGH (ref <18)

## 2023-09-01 LAB — RESP PANEL BY RT-PCR (RSV, FLU A&B, COVID)  RVPGX2
Influenza A by PCR: NEGATIVE
Influenza B by PCR: NEGATIVE
Resp Syncytial Virus by PCR: NEGATIVE
SARS Coronavirus 2 by RT PCR: NEGATIVE

## 2023-09-01 LAB — BRAIN NATRIURETIC PEPTIDE: B Natriuretic Peptide: 43.5 pg/mL (ref 0.0–100.0)

## 2023-09-01 LAB — LIPASE, BLOOD: Lipase: 30 U/L (ref 11–51)

## 2023-09-01 LAB — ETHANOL: Alcohol, Ethyl (B): 320 mg/dL (ref <15)

## 2023-09-01 MED ORDER — HALOPERIDOL LACTATE 5 MG/ML IJ SOLN
2.0000 mg | Freq: Once | INTRAMUSCULAR | Status: AC
  Administered 2023-09-01: 2 mg via INTRAVENOUS
  Filled 2023-09-01: qty 1

## 2023-09-01 MED ORDER — HALOPERIDOL LACTATE 5 MG/ML IJ SOLN: 2.0000 mg | Freq: Once | INTRAMUSCULAR | Status: DC

## 2023-09-01 MED ORDER — LACTATED RINGERS IV BOLUS
1000.0000 mL | Freq: Once | INTRAVENOUS | Status: AC
  Administered 2023-09-01: 1000 mL via INTRAVENOUS

## 2023-09-01 MED ORDER — LORAZEPAM 2 MG/ML IJ SOLN: 1.0000 mg | Freq: Once | INTRAMUSCULAR | Status: DC

## 2023-09-01 MED ORDER — LORAZEPAM 2 MG/ML IJ SOLN
1.0000 mg | Freq: Once | INTRAMUSCULAR | Status: AC
  Administered 2023-09-01: 1 mg via INTRAVENOUS
  Filled 2023-09-01: qty 1

## 2023-09-01 MED ORDER — THIAMINE HCL 100 MG/ML IJ SOLN
100.0000 mg | Freq: Once | INTRAMUSCULAR | Status: AC
  Administered 2023-09-01: 100 mg via INTRAVENOUS
  Filled 2023-09-01: qty 2

## 2023-09-01 NOTE — ED Notes (Addendum)
 Pt ambulated. Steady gate. No falls. Pt AMA.

## 2023-09-01 NOTE — ED Notes (Signed)
 When I arrived first of shift, the patient was intoxicated and his clothes were cut off of him and thrown in the trash. I called [facility] and they said all the trash was in a compactor and would take a day to find anything if at possible. Patient also stated he "may have left it at home." He still asks for it when I go in the room.

## 2023-09-01 NOTE — ED Triage Notes (Signed)
 Pt BIB [facility] from home. Pt reports not feeling good and wanting to come here because he likes the food. Pt had 3 empty kettle one bottles next to him.  72HR 110 Palpated 117CBG

## 2023-09-01 NOTE — Discharge Instructions (Signed)
 Minimize alcohol  use.  Continue home medications as prescribed.  Follow-up with your primary doctor.  Return to the emergency department for any new or worsening symptoms of concern.

## 2023-09-01 NOTE — ED Provider Notes (Signed)
 [place] EMERGENCY DEPARTMENT AT [facility] Provider Note   CSN: [id number] Arrival date & time: 09/01/23  4696     History  Chief Complaint  Patient presents with   Alcohol  Intoxication    MARQUAL MI is a 73 y.o. male.   Alcohol  Intoxication Pertinent negatives include no chest pain, no abdominal pain, no headaches and no shortness of breath.  Patient presents for alcohol  intoxication.  Medical history includes alcohol  abuse, BPH, GERD, HLD, depression, prediabetes, anxiety.  Patient reports 3-day stay in rehab recently.  He returned home on Tuesday.  On Wednesday, he resumed daily alcohol  use.  He was seen in the ED yesterday morning with complaint of generalized abdominal pain and constipation.  His lab work was unremarkable.  He had a bowel movement while in the ER.  He was prescribed Prilosec and Carafate for treatment of gastritis/PUD.  Today, patient denies any physical symptoms.  He states that he is here simply because of "alcohol ".  He reports that his last drink was 9 PM.     Home Medications Prior to Admission medications   Medication Sig Start Date End Date Taking? Authorizing Provider  albuterol  (VENTOLIN  HFA) 108 (90 Base) MCG/ACT inhaler Inhale 2 puffs into the lungs every 6 (six) hours as needed. Patient not taking: Reported on 07/09/2023 05/24/23   [provider]  cloNIDine  (CATAPRES ) 0.1 MG tablet Take 1 tablet (0.1 mg total) by mouth 2 (two) times daily. Patient taking differently: Take 0.1 mg by mouth at bedtime. 02/04/23   Gonfa, Taye T, MD  desvenlafaxine (PRISTIQ) 50 MG 24 hr tablet Take 50 mg by mouth daily. Patient not taking: Reported on 07/09/2023 03/05/23   [provider]  finasteride  (PROSCAR ) 5 MG tablet Take 5 mg by mouth at bedtime.    [provider]  folic acid  (FOLVITE ) 1 MG tablet Take 1 tablet (1 mg total) by mouth daily. 03/01/23   Krishnan, Gokul, MD  gabapentin  (NEURONTIN ) 300 MG capsule Take 300  mg by mouth at bedtime.    [provider]  hydrOXYzine  (ATARAX ) 50 MG tablet Take 50 mg by mouth at bedtime.    [provider]  lactulose  (CHRONULAC ) 10 GM/15ML solution SMARTSIG:Milliliter(s) By Mouth 08/24/23   [provider]  magnesium  hydroxide (MILK OF MAGNESIA) 400 MG/5ML suspension Take 30 mLs by mouth every other day.    [provider]  metoprolol  tartrate (LOPRESSOR ) 50 MG tablet Take 50 mg by mouth 2 (two) times daily.    [provider]  mirtazapine  (REMERON ) 7.5 MG tablet Take 7.5 mg by mouth at bedtime. 08/22/23   [provider]  omeprazole (PRILOSEC) 20 MG capsule Take 1 capsule (20 mg total) by mouth 2 (two) times daily before a meal. 08/31/23   Deatra Face, MD  oxybutynin  (DITROPAN ) 5 MG tablet Take 1 tablet (5 mg total) by mouth 2 (two) times daily as needed for bladder spasms. Patient not taking: Reported on 07/09/2023 03/27/23   Christina Coyer, MD  polyethylene glycol (MIRALAX ) 17 g packet Take 17 g by mouth daily. 08/31/23   Deatra Face, MD  QUEtiapine  (SEROQUEL ) 300 MG tablet Take 300 mg by mouth at bedtime. 12/02/22   [provider]  rosuvastatin  (CRESTOR ) 20 MG tablet Take 20 mg by mouth every Monday, Wednesday, and Friday. 08/23/21   [provider]  sucralfate (CARAFATE) 1 g tablet Take 1 tablet (1 g total) by mouth 2 (two) times daily before a meal. 08/31/23  Deatra Face, MD  tamsulosin  (FLOMAX ) 0.4 MG CAPS capsule Take 0.4 mg by mouth in the morning and at bedtime.    [provider]  thiamine  (VITAMIN B-1) 100 MG tablet Take 1 tablet (100 mg total) by mouth daily. 04/13/23   Krishnan, Gokul, MD      Allergies    Patient has no known allergies.    Review of Systems   Review of Systems  Respiratory:  Negative for shortness of breath.   Cardiovascular:  Negative for chest pain.  Gastrointestinal:  Negative for abdominal pain and nausea.  Neurological:  Negative for headaches.   All other systems reviewed and are negative.   Physical Exam Updated Vital Signs BP (!) 144/81 (BP Location: Right Arm)   Pulse 75   Temp 98.4 F (36.9 C) (Oral)   Resp 11   SpO2 94%  Physical Exam Vitals and nursing note reviewed.  Constitutional:      General: He is not in acute distress.    Appearance: Normal appearance. He is well-developed. He is not ill-appearing, toxic-appearing or diaphoretic.  HENT:     Head: Normocephalic and atraumatic.     Right Ear: External ear normal.     Left Ear: External ear normal.     Nose: Nose normal.     Mouth/Throat:     Mouth: Mucous membranes are moist.  Eyes:     Extraocular Movements: Extraocular movements intact.     Conjunctiva/sclera: Conjunctivae normal.  Cardiovascular:     Rate and Rhythm: Normal rate and regular rhythm.  Pulmonary:     Effort: Pulmonary effort is normal. No respiratory distress.  Abdominal:     General: There is no distension.     Palpations: Abdomen is soft.     Tenderness: There is no abdominal tenderness.  Musculoskeletal:        General: No swelling. Normal range of motion.     Cervical back: Normal range of motion and neck supple.  Skin:    General: Skin is warm and dry.     Coloration: Skin is not jaundiced or pale.  Neurological:     General: No focal deficit present.     Mental Status: He is alert and oriented to person, place, and time.  Psychiatric:        Mood and Affect: Mood normal. Affect is labile.        Speech: Speech is slurred.        Behavior: Behavior normal. Behavior is cooperative.     ED Results / Procedures / Treatments   Labs (all labs ordered are listed, but only abnormal results are displayed) Labs Reviewed  COMPREHENSIVE METABOLIC PANEL WITH GFR - Abnormal; Notable for the following components:      Result Value   Calcium  8.4 (*)    All other components within normal limits  CBC WITH DIFFERENTIAL/PLATELET - Abnormal; Notable for the following components:   RBC  5.87 (*)    All other components within normal limits  ETHANOL - Abnormal; Notable for the following components:   Alcohol , Ethyl (B) 320 (*)    All other components within normal limits  TROPONIN I (HIGH SENSITIVITY) - Abnormal; Notable for the following components:   Troponin I (High Sensitivity) 24 (*)    All other components within normal limits  RESP PANEL BY RT-PCR (RSV, FLU A&B, COVID)  RVPGX2  LIPASE, BLOOD  BRAIN NATRIURETIC PEPTIDE  URINALYSIS, ROUTINE W REFLEX MICROSCOPIC  AMMONIA     EKG EKG  Interpretation Date/Time:  Saturday Sep 01 2023 06:44:25 EDT Ventricular Rate:  71 PR Interval:  162 QRS Duration:  91 QT Interval:  425 QTC Calculation: 462 R Axis:   -20  Text Interpretation: Sinus rhythm Borderline left axis deviation No significant change was found Confirmed by Earma Gloss [phone]) on 09/01/2023 7:07:20 AM  Radiology CT Head Wo Contrast Result Date: 09/01/2023 CLINICAL DATA:  Mental status change with unknown cause EXAM: CT HEAD WITHOUT CONTRAST TECHNIQUE: Contiguous axial images were obtained from the base of the skull through the vertex without intravenous contrast. RADIATION DOSE REDUCTION: This exam was performed according to the departmental dose-optimization program which includes automated exposure control, adjustment of the mA and/or kV according to patient size and/or use of iterative reconstruction technique. COMPARISON:  09/06/2023 FINDINGS: Brain: No evidence of acute infarction, hemorrhage, hydrocephalus, extra-axial collection or mass lesion/mass effect. Vascular: No hyperdense vessel or unexpected calcification. Skull: Normal. Negative for fracture or focal lesion. Sinuses/Orbits: No acute finding. IMPRESSION: Stable, negative head CT. Electronically Signed   By: Ronnette Coke M.D.   On: 09/01/2023 12:59   DG Abdomen Acute W/Chest Result Date: 08/31/2023 CLINICAL DATA:  Constipation.  Abdominal pain. EXAM: DG ABDOMEN ACUTE WITH 1 VIEW CHEST  COMPARISON:  07/07/2023 chest x-ray FINDINGS: Low lung volumes with cardiomegaly. Vascular congestion with basilar atelectasis or infiltrate. Upright film shows no evidence for intraperitoneal free air. Gas-filled nondilated small bowel loops noted with some gas and stool visible in the right colon and rectum. No acute bony abnormality evident. IMPRESSION: 1. Low lung volumes with cardiomegaly and vascular congestion. 2. Nonobstructive bowel gas pattern. Electronically Signed   By: Donnal Fusi M.D.   On: 08/31/2023 11:30    Procedures Procedures    Medications Ordered in ED Medications  haloperidol  lactate (HALDOL ) injection 2 mg (0 mg Intravenous Hold 09/01/23 1000)  LORazepam  (ATIVAN ) injection 1 mg (0 mg Intravenous Hold 09/01/23 1000)  lactated ringers  bolus 1,000 mL (1,000 mLs Intravenous New Bag/Given 09/01/23 0835)  thiamine  (VITAMIN B1) injection 100 mg (100 mg Intravenous Given 09/01/23 0836)  LORazepam  (ATIVAN ) injection 1 mg (1 mg Intravenous Given 09/01/23 0939)  haloperidol  lactate (HALDOL ) injection 2 mg (2 mg Intravenous Given 09/01/23 5621)    ED Course/ Medical Decision Making/ A&P                                 Medical Decision Making Amount and/or Complexity of Data Reviewed Labs: ordered. Radiology: ordered.  Risk Prescription drug management.   This patient presents to the ED for concern of alcohol  abuse, this involves an extensive number of treatment options, and is a complaint that carries with it a high risk of complications and morbidity.  The differential diagnosis includes intoxication, withdrawal, metabolic derangements   Co morbidities that complicate the patient evaluation  alcohol  abuse, BPH, GERD, HLD, depression, prediabetes, anxiety   Additional history obtained:  Additional history obtained from N/A External records from outside source obtained and reviewed including EMR   Lab Tests:  I Ordered, and personally interpreted labs.  The  pertinent results include: Kidney function, normal electrolytes, markedly elevated ethanol level, normal hemoglobin, no leukocytosis   Imaging Studies ordered:  I ordered imaging studies including CT head, x-ray of chest and abdomen I independently visualized and interpreted imaging which showed no acute findings on CT I agree with the radiologist interpretation   Cardiac Monitoring: / EKG:  The patient was maintained  on a cardiac monitor.  I personally viewed and interpreted the cardiac monitored which showed an underlying rhythm of: Sinus rhythm   Problem List / ED Course / Critical interventions / Medication management  Patient presents for alcohol  abuse.  On arrival in the ED, vital signs are normal.  On exam, patient is overall well-appearing.  He does have slurred speech and labile affect, alternating between anger and tearful remorse about his alcoholic relapse.  Although patient reports last EtOH intake 10 hours ago, he does seem acutely intoxicated at this time.  Will check lab work and monitor.  Lab work notable for markedly elevated ethanol level, consistent with intoxication.  While in the ED, patient had agitation.  He was treated with Haldol  and Ativan .  He was subsequently sleeping.  When he woke, he does appear clinically sober.  He does request discharge home.  He was able to ambulate with steady gait.  He was discharged in stable condition. I ordered medication including IV fluids for hydration; thiamine  for alcohol  abuse, Haldol  and Ativan  for agitation Reevaluation of the patient after these medicines showed that the patient improved I have reviewed the patients home medicines and have made adjustments as needed   Social Determinants of Health:  Ongoing alcohol  abuse, frequent ED visits         Final Clinical Impression(s) / ED Diagnoses Final diagnoses:  Alcoholic intoxication without complication [facility])    Rx / DC Orders ED Discharge Orders     None          Iva Mariner, MD 09/01/23 1520

## 2023-09-02 ENCOUNTER — Other Ambulatory Visit: Payer: Self-pay

## 2023-09-02 ENCOUNTER — Encounter (HOSPITAL_COMMUNITY): Payer: Self-pay | Admitting: Emergency Medicine

## 2023-09-02 ENCOUNTER — Emergency Department (HOSPITAL_COMMUNITY)
Admission: EM | Admit: 2023-09-02 | Discharge: 2023-09-02 | Disposition: A | Discharge status: Home / Self Care | Attending: Emergency Medicine | Admitting: Emergency Medicine

## 2023-09-02 DIAGNOSIS — Y908 Blood alcohol level of 240 mg/100 ml or more: Secondary | ICD-10-CM | POA: Diagnosis not present

## 2023-09-02 DIAGNOSIS — F419 Anxiety disorder, unspecified: Secondary | ICD-10-CM | POA: Diagnosis not present

## 2023-09-02 DIAGNOSIS — E785 Hyperlipidemia, unspecified: Secondary | ICD-10-CM | POA: Diagnosis not present

## 2023-09-02 DIAGNOSIS — R7303 Prediabetes: Secondary | ICD-10-CM | POA: Diagnosis not present

## 2023-09-02 DIAGNOSIS — F10920 Alcohol use, unspecified with intoxication, uncomplicated: Secondary | ICD-10-CM

## 2023-09-02 DIAGNOSIS — F32A Depression, unspecified: Secondary | ICD-10-CM | POA: Diagnosis not present

## 2023-09-02 DIAGNOSIS — R456 Violent behavior: Secondary | ICD-10-CM | POA: Diagnosis not present

## 2023-09-02 DIAGNOSIS — K219 Gastro-esophageal reflux disease without esophagitis: Secondary | ICD-10-CM | POA: Diagnosis not present

## 2023-09-02 DIAGNOSIS — F10129 Alcohol abuse with intoxication, unspecified: Secondary | ICD-10-CM | POA: Insufficient documentation

## 2023-09-02 DIAGNOSIS — R410 Disorientation, unspecified: Secondary | ICD-10-CM | POA: Diagnosis not present

## 2023-09-02 DIAGNOSIS — N400 Enlarged prostate without lower urinary tract symptoms: Secondary | ICD-10-CM | POA: Insufficient documentation

## 2023-09-02 DIAGNOSIS — I10 Essential (primary) hypertension: Secondary | ICD-10-CM | POA: Diagnosis not present

## 2023-09-02 LAB — CBC WITH DIFFERENTIAL/PLATELET
Abs Immature Granulocytes: 0.01 10*3/uL (ref 0.00–0.07)
Basophils Absolute: 0.1 10*3/uL (ref 0.0–0.1)
Basophils Relative: 2 %
Eosinophils Absolute: 0.2 10*3/uL (ref 0.0–0.5)
Eosinophils Relative: 4 %
HCT: 48.2 % (ref 39.0–52.0)
Hemoglobin: 15.1 g/dL (ref 13.0–17.0)
Immature Granulocytes: 0 %
Lymphocytes Relative: 32 %
Lymphs Abs: 1.7 10*3/uL (ref 0.7–4.0)
MCH: 27.5 pg (ref 26.0–34.0)
MCHC: 31.3 g/dL (ref 30.0–36.0)
MCV: 87.8 fL (ref 80.0–100.0)
Monocytes Absolute: 0.4 10*3/uL (ref 0.1–1.0)
Monocytes Relative: 7 %
Neutro Abs: 3 10*3/uL (ref 1.7–7.7)
Neutrophils Relative %: 55 %
Platelets: 109 10*3/uL — ABNORMAL LOW (ref 150–400)
RBC: 5.49 MIL/uL (ref 4.22–5.81)
RDW: 15.3 % (ref 11.5–15.5)
WBC: 5.4 10*3/uL (ref 4.0–10.5)
nRBC: 0 % (ref 0.0–0.2)

## 2023-09-02 LAB — COMPREHENSIVE METABOLIC PANEL WITH GFR
ALT: 34 U/L (ref 0–44)
AST: 35 U/L (ref 15–41)
Albumin: 3.7 g/dL (ref 3.5–5.0)
Alkaline Phosphatase: 61 U/L (ref 38–126)
Anion gap: 20 — ABNORMAL HIGH (ref 5–15)
BUN: 11 mg/dL (ref 8–23)
CO2: 20 mmol/L — ABNORMAL LOW (ref 22–32)
Calcium: 8.3 mg/dL — ABNORMAL LOW (ref 8.9–10.3)
Chloride: 101 mmol/L (ref 98–111)
Creatinine, Ser: 0.95 mg/dL (ref 0.61–1.24)
GFR, Estimated: >60 mL/min (ref >60)
Glucose, Bld: 108 mg/dL — ABNORMAL HIGH (ref 70–99)
Potassium: 3.7 mmol/L (ref 3.5–5.1)
Sodium: 141 mmol/L (ref 135–145)
Total Bilirubin: 0.8 mg/dL (ref 0.0–1.2)
Total Protein: 6.9 g/dL (ref 6.5–8.1)

## 2023-09-02 LAB — RAPID URINE DRUG SCREEN, HOSP PERFORMED
Amphetamines: NOT DETECTED
Barbiturates: NOT DETECTED
Benzodiazepines: POSITIVE — AB
Cocaine: NOT DETECTED
Opiates: NOT DETECTED
Tetrahydrocannabinol: NOT DETECTED

## 2023-09-02 LAB — ETHANOL: Alcohol, Ethyl (B): 310 mg/dL (ref <15)

## 2023-09-02 MED ORDER — THIAMINE HCL 100 MG/ML IJ SOLN
100.0000 mg | Freq: Once | INTRAMUSCULAR | Status: AC
  Administered 2023-09-02: 100 mg via INTRAVENOUS
  Filled 2023-09-02: qty 2

## 2023-09-02 MED ORDER — LACTATED RINGERS IV BOLUS
1000.0000 mL | Freq: Once | INTRAVENOUS | Status: AC
  Administered 2023-09-02: 1000 mL via INTRAVENOUS

## 2023-09-02 MED ORDER — LORAZEPAM 2 MG/ML IJ SOLN
2.0000 mg | Freq: Once | INTRAMUSCULAR | Status: AC
  Administered 2023-09-02: 2 mg via INTRAMUSCULAR
  Filled 2023-09-02: qty 1

## 2023-09-02 NOTE — ED Notes (Signed)
 Pt ambulating, steady gait noted

## 2023-09-02 NOTE — ED Notes (Signed)
 Pt requesting to be discharged, EDP notified

## 2023-09-02 NOTE — ED Provider Triage Note (Signed)
 Emergency Medicine Provider Triage Evaluation Note  Ruben [name] , a 73 y.o. male  was evaluated in triage.  Pt complains of alcohol  intoxication and aggressive behavior   Review of Systems  Positive: Aggressive behavior  Negative: Vomiting   Physical Exam  BP (!) 141/83   Pulse 85   Temp 98.2 F (36.8 C) (Oral)   Resp 18   Ht 5\' 11"  (1.803 m)   Wt 95.3 kg   SpO2 96%   BMI 29.29 kg/m  Gen:   Awake Resp:  Normal effort  MSK:   Moves extremities without difficulty  Other:  Aggressive kicking   Medical Decision Making  Medically screening exam initiated at 6:44 AM.  Appropriate orders placed.  Ruben [name] was informed that the remainder of the evaluation will be completed by another provider, this initial triage assessment does not replace that evaluation, and the importance of remaining in the ED until their evaluation is complete.     Ruben Mesch, MD 09/02/23 7829

## 2023-09-02 NOTE — ED Notes (Signed)
 Pt desatted to 87% while sleeping,  placed on 2L [place] with rebound to 94%

## 2023-09-02 NOTE — ED Provider Notes (Signed)
 [place] EMERGENCY DEPARTMENT AT [facility] Provider Note   CSN: [id number] Arrival date & time: 09/02/23  4098     History  Chief Complaint  Patient presents with   Alcohol  Intoxication    Ruben [name] is a 73 y.o. male.   Alcohol  Intoxication  Patient presents for alcohol  intoxication.  Medical history includes alcohol  abuse, BPH, GERD, HLD, depression, prediabetes, anxiety.  He was seen in the ED yesterday for alcohol  intoxication.  Workup at the time was notable for markedly elevated ethanol level.  Today, he again presents to the ED stating that he drank too much.  He was agitated on arrival and was given Ativan .  He is currently sleeping and unable to provide any further history.     Home Medications Prior to Admission medications   Medication Sig Start Date End Date Taking? Authorizing Provider  albuterol  (VENTOLIN  HFA) 108 (90 Base) MCG/ACT inhaler Inhale 2 puffs into the lungs every 6 (six) hours as needed. Patient not taking: Reported on 07/09/2023 05/24/23   [provider]  cloNIDine  (CATAPRES ) 0.1 MG tablet Take 1 tablet (0.1 mg total) by mouth 2 (two) times daily. Patient taking differently: Take 0.1 mg by mouth at bedtime. 02/04/23   Gonfa, Taye T, MD  desvenlafaxine (PRISTIQ) 50 MG 24 hr tablet Take 50 mg by mouth daily. Patient not taking: Reported on 07/09/2023 03/05/23   [provider]  finasteride  (PROSCAR ) 5 MG tablet Take 5 mg by mouth at bedtime.    [provider]  folic acid  (FOLVITE ) 1 MG tablet Take 1 tablet (1 mg total) by mouth daily. 03/01/23   Krishnan, Gokul, MD  gabapentin  (NEURONTIN ) 300 MG capsule Take 300 mg by mouth at bedtime.    [provider]  hydrOXYzine  (ATARAX ) 50 MG tablet Take 50 mg by mouth at bedtime.    [provider]  lactulose  (CHRONULAC ) 10 GM/15ML solution SMARTSIG:Milliliter(s) By Mouth 08/24/23   [provider]  magnesium  hydroxide (MILK OF MAGNESIA) 400  MG/5ML suspension Take 30 mLs by mouth every other day.    [provider]  metoprolol  tartrate (LOPRESSOR ) 50 MG tablet Take 50 mg by mouth 2 (two) times daily.    [provider]  mirtazapine  (REMERON ) 7.5 MG tablet Take 7.5 mg by mouth at bedtime. 08/22/23   [provider]  omeprazole (PRILOSEC) 20 MG capsule Take 1 capsule (20 mg total) by mouth 2 (two) times daily before a meal. 08/31/23   Deatra Face, MD  oxybutynin  (DITROPAN ) 5 MG tablet Take 1 tablet (5 mg total) by mouth 2 (two) times daily as needed for bladder spasms. Patient not taking: Reported on 07/09/2023 03/27/23   Christina Coyer, MD  polyethylene glycol (MIRALAX ) 17 g packet Take 17 g by mouth daily. 08/31/23   Deatra Face, MD  QUEtiapine  (SEROQUEL ) 300 MG tablet Take 300 mg by mouth at bedtime. 12/02/22   [provider]  rosuvastatin  (CRESTOR ) 20 MG tablet Take 20 mg by mouth every Monday, Wednesday, and Friday. 08/23/21   [provider]  sucralfate (CARAFATE) 1 g tablet Take 1 tablet (1 g total) by mouth 2 (two) times daily before a meal. 08/31/23   Deatra Face, MD  tamsulosin  (FLOMAX ) 0.4 MG CAPS capsule Take 0.4 mg by mouth in the morning and at bedtime.    [provider]  thiamine  (VITAMIN B-1) 100 MG tablet Take 1 tablet (100 mg total) by mouth daily. 04/13/23   Krishnan, Gokul, MD  Allergies    Patient has no known allergies.    Review of Systems   Review of Systems  Unable to perform ROS: Mental status change    Physical Exam Updated Vital Signs BP (!) 140/76   Pulse (!) 110   Temp 98.2 F (36.8 C) (Oral)   Resp (!) 22   Ht 5\' 11"  (1.803 m)   Wt 95.3 kg   SpO2 96%   BMI 29.29 kg/m  Physical Exam Vitals and nursing note reviewed.  Constitutional:      General: He is not in acute distress.    Appearance: Normal appearance. He is well-developed. He is not ill-appearing, toxic-appearing or diaphoretic.  HENT:     Head: Normocephalic and  atraumatic.     Right Ear: External ear normal.     Left Ear: External ear normal.     Nose: Nose normal.     Mouth/Throat:     Mouth: Mucous membranes are moist.  Eyes:     Extraocular Movements: Extraocular movements intact.     Conjunctiva/sclera: Conjunctivae normal.  Cardiovascular:     Rate and Rhythm: Normal rate and regular rhythm.  Pulmonary:     Effort: Pulmonary effort is normal. No respiratory distress.  Abdominal:     General: There is no distension.     Palpations: Abdomen is soft.  Musculoskeletal:        General: No swelling or deformity.     Cervical back: Normal range of motion and neck supple.  Skin:    General: Skin is warm and dry.     Coloration: Skin is not jaundiced or pale.  Neurological:     Mental Status: He is alert.     ED Results / Procedures / Treatments   Labs (all labs ordered are listed, but only abnormal results are displayed) Labs Reviewed  COMPREHENSIVE METABOLIC PANEL WITH GFR - Abnormal; Notable for the following components:      Result Value   CO2 20 (*)    Glucose, Bld 108 (*)    Calcium  8.3 (*)    Anion gap 20 (*)    All other components within normal limits  ETHANOL - Abnormal; Notable for the following components:   Alcohol , Ethyl (B) 310 (*)    All other components within normal limits  CBC WITH DIFFERENTIAL/PLATELET - Abnormal; Notable for the following components:   Platelets 109 (*)    All other components within normal limits  RAPID URINE DRUG SCREEN, HOSP PERFORMED    EKG None  Radiology CT Head Wo Contrast Result Date: 09/01/2023 CLINICAL DATA:  Mental status change with unknown cause EXAM: CT HEAD WITHOUT CONTRAST TECHNIQUE: Contiguous axial images were obtained from the base of the skull through the vertex without intravenous contrast. RADIATION DOSE REDUCTION: This exam was performed according to the departmental dose-optimization program which includes automated exposure control, adjustment of the mA and/or kV  according to patient size and/or use of iterative reconstruction technique. COMPARISON:  09/06/2023 FINDINGS: Brain: No evidence of acute infarction, hemorrhage, hydrocephalus, extra-axial collection or mass lesion/mass effect. Vascular: No hyperdense vessel or unexpected calcification. Skull: Normal. Negative for fracture or focal lesion. Sinuses/Orbits: No acute finding. IMPRESSION: Stable, negative head CT. Electronically Signed   By: Ronnette Coke M.D.   On: 09/01/2023 12:59    Procedures Procedures    Medications Ordered in ED Medications  LORazepam  (ATIVAN ) injection 2 mg (2 mg Intramuscular Given 09/02/23 0648)  lactated ringers  bolus 1,000 mL (1,000 mLs Intravenous New  Bag/Given 09/02/23 0736)  thiamine  (VITAMIN B1) injection 100 mg (100 mg Intravenous Given 09/02/23 2130)    ED Course/ Medical Decision Making/ A&P                                 Medical Decision Making Risk Prescription drug management.   This patient presents to the ED for concern of alcohol  intoxication, this involves an extensive number of treatment options, and is a complaint that carries with it a high risk of complications and morbidity.  The differential diagnosis includes uncomplicated intoxication, metabolic derangements, congestion   Co morbidities / Chronic conditions that complicate the patient evaluation  alcohol  abuse, BPH, GERD, HLD, depression, prediabetes, anxiety   Additional history obtained:  Additional history obtained from N/A External records from outside source obtained and reviewed including EMR   Lab Tests:  I Ordered, and personally interpreted labs.  The pertinent results include: Normal hemoglobin, no leukocytosis, normal kidney function, normal electrolytes.  Markedly elevated ethanol level consistent with intoxication.   Cardiac Monitoring: / EKG:  The patient was maintained on a cardiac monitor.  I personally viewed and interpreted the cardiac monitored which showed  an underlying rhythm of: Sinus rhythm   Problem List / ED Course / Critical interventions / Medication management  Patient presents for alcohol  intoxication.  Seen in the ED yesterday for the same.  He was agitated on arrival and received 2 mg of Ativan .  He is currently sleeping.  Vital signs notable for slight tachycardia.  IV fluids ordered.  Lab work notable for elevated ethanol level.  On reassessment, patient sleeping but easily arousable.  He was allowed to continue to sleep and metabolize alcohol  while in the ED.  He subsequently was able to wake up and ambulate steady.  He does request discharge home at this time.  Patient was discharged in stable condition. I ordered medication including IV fluids for hydration; thiamine  for chronic alcohol  abuse; Ativan  for agitation Reevaluation of the patient after these medicines showed that the patient improved I have reviewed the patients home medicines and have made adjustments as needed   Social Determinants of Health:  Frequent ED visits, ongoing alcohol  abuse        Final Clinical Impression(s) / ED Diagnoses Final diagnoses:  Alcoholic intoxication without complication [facility])    Rx / DC Orders ED Discharge Orders     None         Iva Mariner, MD 09/02/23 1143

## 2023-09-02 NOTE — ED Triage Notes (Signed)
 Pt presents to the ED via [facility] with complaints of ETOH intoxication. Pt called EMS himself because he "drank too much". Endorses the consumption of Vodka PTA. A&Ox4 at this time. Denies SI/HI, dizziness, N/V, CP or SOB.

## 2023-09-02 NOTE — Discharge Instructions (Signed)
 Minimize alcohol  use.  A list of outpatient resources attached to help you with this.

## 2023-09-14 DIAGNOSIS — I10 Essential (primary) hypertension: Secondary | ICD-10-CM | POA: Diagnosis not present

## 2023-09-14 DIAGNOSIS — F5101 Primary insomnia: Secondary | ICD-10-CM | POA: Diagnosis not present

## 2023-09-14 DIAGNOSIS — I700 Atherosclerosis of aorta: Secondary | ICD-10-CM | POA: Diagnosis not present

## 2023-09-14 DIAGNOSIS — Z6829 Body mass index (BMI) 29.0-29.9, adult: Secondary | ICD-10-CM | POA: Diagnosis not present

## 2023-09-14 DIAGNOSIS — F1010 Alcohol abuse, uncomplicated: Secondary | ICD-10-CM | POA: Diagnosis not present

## 2023-10-10 DIAGNOSIS — F40240 Claustrophobia: Secondary | ICD-10-CM | POA: Diagnosis not present

## 2023-10-10 DIAGNOSIS — I351 Nonrheumatic aortic (valve) insufficiency: Secondary | ICD-10-CM | POA: Diagnosis not present

## 2023-10-10 DIAGNOSIS — I10 Essential (primary) hypertension: Secondary | ICD-10-CM | POA: Diagnosis not present

## 2023-10-10 DIAGNOSIS — R918 Other nonspecific abnormal finding of lung field: Secondary | ICD-10-CM | POA: Diagnosis not present

## 2023-10-10 DIAGNOSIS — Z6828 Body mass index (BMI) 28.0-28.9, adult: Secondary | ICD-10-CM | POA: Diagnosis not present

## 2023-10-10 DIAGNOSIS — R000 Tachycardia, unspecified: Secondary | ICD-10-CM | POA: Diagnosis not present

## 2023-10-10 DIAGNOSIS — E785 Hyperlipidemia, unspecified: Secondary | ICD-10-CM | POA: Diagnosis not present

## 2023-10-10 DIAGNOSIS — R1110 Vomiting, unspecified: Secondary | ICD-10-CM | POA: Diagnosis not present

## 2023-10-10 DIAGNOSIS — R1111 Vomiting without nausea: Secondary | ICD-10-CM | POA: Diagnosis not present

## 2023-10-10 DIAGNOSIS — N400 Enlarged prostate without lower urinary tract symptoms: Secondary | ICD-10-CM | POA: Diagnosis not present

## 2023-10-10 DIAGNOSIS — F10239 Alcohol dependence with withdrawal, unspecified: Secondary | ICD-10-CM | POA: Diagnosis not present

## 2023-10-10 DIAGNOSIS — F10929 Alcohol use, unspecified with intoxication, unspecified: Secondary | ICD-10-CM | POA: Diagnosis not present

## 2023-10-10 DIAGNOSIS — R9431 Abnormal electrocardiogram [ECG] [EKG]: Secondary | ICD-10-CM | POA: Diagnosis not present

## 2023-10-10 DIAGNOSIS — Z91199 Patient's noncompliance with other medical treatment and regimen due to unspecified reason: Secondary | ICD-10-CM | POA: Diagnosis not present

## 2023-10-10 DIAGNOSIS — Z66 Do not resuscitate: Secondary | ICD-10-CM | POA: Diagnosis not present

## 2023-10-10 DIAGNOSIS — F10129 Alcohol abuse with intoxication, unspecified: Secondary | ICD-10-CM | POA: Diagnosis not present

## 2023-10-10 DIAGNOSIS — R55 Syncope and collapse: Secondary | ICD-10-CM | POA: Diagnosis not present

## 2023-10-10 DIAGNOSIS — R112 Nausea with vomiting, unspecified: Secondary | ICD-10-CM | POA: Diagnosis not present

## 2023-10-10 DIAGNOSIS — F10939 Alcohol use, unspecified with withdrawal, unspecified: Secondary | ICD-10-CM | POA: Diagnosis not present

## 2023-10-10 DIAGNOSIS — E669 Obesity, unspecified: Secondary | ICD-10-CM | POA: Diagnosis not present

## 2023-10-10 DIAGNOSIS — E872 Acidosis, unspecified: Secondary | ICD-10-CM | POA: Diagnosis not present

## 2023-10-10 DIAGNOSIS — R456 Violent behavior: Secondary | ICD-10-CM | POA: Diagnosis not present

## 2023-10-10 DIAGNOSIS — D696 Thrombocytopenia, unspecified: Secondary | ICD-10-CM | POA: Diagnosis not present

## 2023-10-10 DIAGNOSIS — K5900 Constipation, unspecified: Secondary | ICD-10-CM | POA: Diagnosis not present

## 2023-10-10 DIAGNOSIS — R7989 Other specified abnormal findings of blood chemistry: Secondary | ICD-10-CM | POA: Diagnosis not present

## 2023-10-13 DIAGNOSIS — F10129 Alcohol abuse with intoxication, unspecified: Secondary | ICD-10-CM | POA: Diagnosis not present

## 2023-10-13 DIAGNOSIS — R456 Violent behavior: Secondary | ICD-10-CM | POA: Diagnosis not present

## 2023-10-13 DIAGNOSIS — F1020 Alcohol dependence, uncomplicated: Secondary | ICD-10-CM | POA: Diagnosis not present

## 2023-10-13 DIAGNOSIS — Z91199 Patient's noncompliance with other medical treatment and regimen due to unspecified reason: Secondary | ICD-10-CM | POA: Diagnosis not present

## 2023-10-13 DIAGNOSIS — F10929 Alcohol use, unspecified with intoxication, unspecified: Secondary | ICD-10-CM | POA: Diagnosis not present

## 2023-10-13 DIAGNOSIS — E669 Obesity, unspecified: Secondary | ICD-10-CM | POA: Diagnosis not present

## 2023-10-13 DIAGNOSIS — E785 Hyperlipidemia, unspecified: Secondary | ICD-10-CM | POA: Diagnosis not present

## 2023-10-13 DIAGNOSIS — Z6828 Body mass index (BMI) 28.0-28.9, adult: Secondary | ICD-10-CM | POA: Diagnosis not present

## 2023-10-13 DIAGNOSIS — R519 Headache, unspecified: Secondary | ICD-10-CM | POA: Diagnosis not present

## 2023-10-13 DIAGNOSIS — R9431 Abnormal electrocardiogram [ECG] [EKG]: Secondary | ICD-10-CM | POA: Diagnosis not present

## 2023-10-14 DIAGNOSIS — R519 Headache, unspecified: Secondary | ICD-10-CM | POA: Diagnosis not present

## 2023-10-14 DIAGNOSIS — F1020 Alcohol dependence, uncomplicated: Secondary | ICD-10-CM | POA: Diagnosis not present

## 2023-10-19 DIAGNOSIS — F1020 Alcohol dependence, uncomplicated: Secondary | ICD-10-CM | POA: Diagnosis not present

## 2023-10-19 DIAGNOSIS — E039 Hypothyroidism, unspecified: Secondary | ICD-10-CM | POA: Diagnosis not present

## 2023-10-22 DIAGNOSIS — F1020 Alcohol dependence, uncomplicated: Secondary | ICD-10-CM | POA: Diagnosis not present

## 2023-10-22 DIAGNOSIS — G4700 Insomnia, unspecified: Secondary | ICD-10-CM | POA: Diagnosis not present

## 2023-10-22 DIAGNOSIS — F411 Generalized anxiety disorder: Secondary | ICD-10-CM | POA: Diagnosis not present

## 2023-10-22 DIAGNOSIS — F331 Major depressive disorder, recurrent, moderate: Secondary | ICD-10-CM | POA: Diagnosis not present

## 2023-10-23 DIAGNOSIS — F331 Major depressive disorder, recurrent, moderate: Secondary | ICD-10-CM | POA: Diagnosis not present

## 2023-10-23 DIAGNOSIS — F411 Generalized anxiety disorder: Secondary | ICD-10-CM | POA: Diagnosis not present

## 2023-10-23 DIAGNOSIS — G4700 Insomnia, unspecified: Secondary | ICD-10-CM | POA: Diagnosis not present

## 2023-10-23 DIAGNOSIS — F1020 Alcohol dependence, uncomplicated: Secondary | ICD-10-CM | POA: Diagnosis not present

## 2023-10-24 DIAGNOSIS — F411 Generalized anxiety disorder: Secondary | ICD-10-CM | POA: Diagnosis not present

## 2023-10-24 DIAGNOSIS — G4700 Insomnia, unspecified: Secondary | ICD-10-CM | POA: Diagnosis not present

## 2023-10-24 DIAGNOSIS — F1020 Alcohol dependence, uncomplicated: Secondary | ICD-10-CM | POA: Diagnosis not present

## 2023-10-24 DIAGNOSIS — F331 Major depressive disorder, recurrent, moderate: Secondary | ICD-10-CM | POA: Diagnosis not present

## 2023-10-30 DIAGNOSIS — Z5181 Encounter for therapeutic drug level monitoring: Secondary | ICD-10-CM | POA: Diagnosis not present

## 2023-10-30 DIAGNOSIS — F1020 Alcohol dependence, uncomplicated: Secondary | ICD-10-CM | POA: Diagnosis not present

## 2023-10-30 DIAGNOSIS — F331 Major depressive disorder, recurrent, moderate: Secondary | ICD-10-CM | POA: Diagnosis not present

## 2023-10-30 DIAGNOSIS — G4700 Insomnia, unspecified: Secondary | ICD-10-CM | POA: Diagnosis not present

## 2023-10-31 DIAGNOSIS — Z5181 Encounter for therapeutic drug level monitoring: Secondary | ICD-10-CM | POA: Diagnosis not present

## 2023-10-31 DIAGNOSIS — G4700 Insomnia, unspecified: Secondary | ICD-10-CM | POA: Diagnosis not present

## 2023-10-31 DIAGNOSIS — F1020 Alcohol dependence, uncomplicated: Secondary | ICD-10-CM | POA: Diagnosis not present

## 2023-10-31 DIAGNOSIS — F331 Major depressive disorder, recurrent, moderate: Secondary | ICD-10-CM | POA: Diagnosis not present

## 2023-11-06 DIAGNOSIS — F331 Major depressive disorder, recurrent, moderate: Secondary | ICD-10-CM | POA: Diagnosis not present

## 2023-11-06 DIAGNOSIS — G4700 Insomnia, unspecified: Secondary | ICD-10-CM | POA: Diagnosis not present

## 2023-11-06 DIAGNOSIS — F411 Generalized anxiety disorder: Secondary | ICD-10-CM | POA: Diagnosis not present

## 2023-11-06 DIAGNOSIS — F1020 Alcohol dependence, uncomplicated: Secondary | ICD-10-CM | POA: Diagnosis not present

## 2023-11-08 DIAGNOSIS — F3341 Major depressive disorder, recurrent, in partial remission: Secondary | ICD-10-CM | POA: Diagnosis not present

## 2023-11-08 DIAGNOSIS — N400 Enlarged prostate without lower urinary tract symptoms: Secondary | ICD-10-CM | POA: Diagnosis not present

## 2023-11-08 DIAGNOSIS — F331 Major depressive disorder, recurrent, moderate: Secondary | ICD-10-CM | POA: Diagnosis not present

## 2023-11-08 DIAGNOSIS — N1831 Chronic kidney disease, stage 3a: Secondary | ICD-10-CM | POA: Diagnosis not present

## 2023-11-09 DIAGNOSIS — F331 Major depressive disorder, recurrent, moderate: Secondary | ICD-10-CM | POA: Diagnosis not present

## 2023-11-09 DIAGNOSIS — F1020 Alcohol dependence, uncomplicated: Secondary | ICD-10-CM | POA: Diagnosis not present

## 2023-11-09 DIAGNOSIS — Z5181 Encounter for therapeutic drug level monitoring: Secondary | ICD-10-CM | POA: Diagnosis not present

## 2023-11-09 DIAGNOSIS — G4700 Insomnia, unspecified: Secondary | ICD-10-CM | POA: Diagnosis not present

## 2023-11-26 DIAGNOSIS — K219 Gastro-esophageal reflux disease without esophagitis: Secondary | ICD-10-CM | POA: Diagnosis not present

## 2023-11-26 DIAGNOSIS — I700 Atherosclerosis of aorta: Secondary | ICD-10-CM | POA: Diagnosis not present

## 2023-11-26 DIAGNOSIS — F1021 Alcohol dependence, in remission: Secondary | ICD-10-CM | POA: Diagnosis not present

## 2023-11-26 DIAGNOSIS — I10 Essential (primary) hypertension: Secondary | ICD-10-CM | POA: Diagnosis not present

## 2023-11-26 DIAGNOSIS — F5101 Primary insomnia: Secondary | ICD-10-CM | POA: Diagnosis not present

## 2023-11-26 DIAGNOSIS — F1010 Alcohol abuse, uncomplicated: Secondary | ICD-10-CM | POA: Diagnosis not present

## 2023-11-26 DIAGNOSIS — N1831 Chronic kidney disease, stage 3a: Secondary | ICD-10-CM | POA: Diagnosis not present

## 2023-11-26 DIAGNOSIS — R7303 Prediabetes: Secondary | ICD-10-CM | POA: Diagnosis not present

## 2023-12-09 DIAGNOSIS — F331 Major depressive disorder, recurrent, moderate: Secondary | ICD-10-CM | POA: Diagnosis not present

## 2023-12-09 DIAGNOSIS — N1831 Chronic kidney disease, stage 3a: Secondary | ICD-10-CM | POA: Diagnosis not present

## 2023-12-09 DIAGNOSIS — N400 Enlarged prostate without lower urinary tract symptoms: Secondary | ICD-10-CM | POA: Diagnosis not present

## 2023-12-09 DIAGNOSIS — F3341 Major depressive disorder, recurrent, in partial remission: Secondary | ICD-10-CM | POA: Diagnosis not present

## 2023-12-12 DIAGNOSIS — R3914 Feeling of incomplete bladder emptying: Secondary | ICD-10-CM | POA: Diagnosis not present

## 2023-12-12 DIAGNOSIS — R3915 Urgency of urination: Secondary | ICD-10-CM | POA: Diagnosis not present

## 2023-12-12 DIAGNOSIS — N401 Enlarged prostate with lower urinary tract symptoms: Secondary | ICD-10-CM | POA: Diagnosis not present

## 2023-12-15 ENCOUNTER — Other Ambulatory Visit: Payer: Self-pay

## 2023-12-15 ENCOUNTER — Encounter (HOSPITAL_COMMUNITY): Payer: Self-pay | Admitting: Emergency Medicine

## 2023-12-15 ENCOUNTER — Emergency Department (HOSPITAL_COMMUNITY)
Admission: EM | Admit: 2023-12-15 | Discharge: 2023-12-15 | Disposition: A | Discharge status: Against Medical Advice | Attending: Emergency Medicine | Admitting: Emergency Medicine

## 2023-12-15 DIAGNOSIS — Z5329 Procedure and treatment not carried out because of patient's decision for other reasons: Secondary | ICD-10-CM | POA: Diagnosis not present

## 2023-12-15 DIAGNOSIS — F29 Unspecified psychosis not due to a substance or known physiological condition: Secondary | ICD-10-CM | POA: Diagnosis not present

## 2023-12-15 DIAGNOSIS — F10120 Alcohol abuse with intoxication, uncomplicated: Secondary | ICD-10-CM | POA: Insufficient documentation

## 2023-12-15 DIAGNOSIS — F10929 Alcohol use, unspecified with intoxication, unspecified: Secondary | ICD-10-CM | POA: Diagnosis not present

## 2023-12-15 DIAGNOSIS — F1010 Alcohol abuse, uncomplicated: Secondary | ICD-10-CM | POA: Diagnosis not present

## 2023-12-15 DIAGNOSIS — Z79899 Other long term (current) drug therapy: Secondary | ICD-10-CM | POA: Diagnosis not present

## 2023-12-15 DIAGNOSIS — F10129 Alcohol abuse with intoxication, unspecified: Secondary | ICD-10-CM | POA: Diagnosis not present

## 2023-12-15 DIAGNOSIS — R442 Other hallucinations: Secondary | ICD-10-CM | POA: Diagnosis not present

## 2023-12-15 DIAGNOSIS — Y909 Presence of alcohol in blood, level not specified: Secondary | ICD-10-CM | POA: Insufficient documentation

## 2023-12-15 DIAGNOSIS — R456 Violent behavior: Secondary | ICD-10-CM | POA: Diagnosis not present

## 2023-12-15 LAB — CBC
HCT: 49.3 % (ref 39.0–52.0)
Hemoglobin: 16 g/dL (ref 13.0–17.0)
MCH: 28.8 pg (ref 26.0–34.0)
MCHC: 32.5 g/dL (ref 30.0–36.0)
MCV: 88.8 fL (ref 80.0–100.0)
Platelets: 154 K/uL (ref 150–400)
RBC: 5.55 MIL/uL (ref 4.22–5.81)
RDW: 15.3 % (ref 11.5–15.5)
WBC: 4.6 K/uL (ref 4.0–10.5)
nRBC: 0 % (ref 0.0–0.2)

## 2023-12-15 MED ORDER — THIAMINE MONONITRATE 100 MG PO TABS
100.0000 mg | ORAL_TABLET | Freq: Every day | ORAL | Status: DC
  Administered 2023-12-15: 100 mg via ORAL
  Filled 2023-12-15: qty 1

## 2023-12-15 MED ORDER — ADULT MULTIVITAMIN W/MINERALS CH
1.0000 | ORAL_TABLET | Freq: Every day | ORAL | Status: DC
  Administered 2023-12-15: 1 via ORAL
  Filled 2023-12-15: qty 1

## 2023-12-15 MED ORDER — LORAZEPAM 2 MG/ML IJ SOLN
1.0000 mg | INTRAMUSCULAR | Status: DC | PRN
  Administered 2023-12-15: 1 mg via INTRAVENOUS
  Filled 2023-12-15: qty 1

## 2023-12-15 MED ORDER — ZIPRASIDONE MESYLATE 20 MG IM SOLR
20.0000 mg | Freq: Once | INTRAMUSCULAR | Status: AC
Start: 2023-12-15 — End: 2023-12-15

## 2023-12-15 MED ORDER — LORAZEPAM 1 MG PO TABS: 1.0000 mg | ORAL_TABLET | ORAL | Status: DC | PRN

## 2023-12-15 MED ORDER — THIAMINE HCL 100 MG/ML IJ SOLN: 100.0000 mg | Freq: Every day | INTRAMUSCULAR | Status: DC

## 2023-12-15 MED ORDER — ZIPRASIDONE MESYLATE 20 MG IM SOLR
INTRAMUSCULAR | Status: AC
  Administered 2023-12-15: 20 mg via INTRAMUSCULAR
  Filled 2023-12-15: qty 20

## 2023-12-15 MED ORDER — FOLIC ACID 1 MG PO TABS
1.0000 mg | ORAL_TABLET | Freq: Every day | ORAL | Status: DC
  Administered 2023-12-15: 1 mg via ORAL
  Filled 2023-12-15: qty 1

## 2023-12-15 NOTE — Progress Notes (Signed)
 ICM consulted for substance abuse resources. Resources provided and attached to AVS. No further ICM needs.

## 2023-12-15 NOTE — Discharge Instructions (Signed)
 You have chosen to leave AGAINST MEDICAL ADVICE. Should you change your mind, you are always welcome and encouraged to return to the ED. You are encouraged to follow-up with, at the very least, a primary care provider, or other similar medical professional on this matter.

## 2023-12-15 NOTE — ED Provider Notes (Signed)
 [place] EMERGENCY DEPARTMENT AT [facility] Provider Note   CSN: [id number] Arrival date & time: 12/15/23  1101     Patient presents with: Alcohol  Intoxication   Ruben [name] is a 73 y.o. male presents today via EMS from home.  Per EMS neighbors found the patient appearing drunk and hallucinating prompting them to call EMS.  Patient has a history of alcohol  use disorder.  Patient was initially combative with staff and had to get both chemical and physical restraints.  Patient denies nausea, vomiting, chest pain, shortness of breath, SI/HI, AVH, any other complaints at this time.  Patient is unsure of the last time he drink.    Alcohol  Intoxication       Prior to Admission medications   Medication Sig Start Date End Date Taking? Authorizing Provider  albuterol  (VENTOLIN  HFA) 108 (90 Base) MCG/ACT inhaler Inhale 2 puffs into the lungs every 6 (six) hours as needed. Patient not taking: Reported on 07/09/2023 05/24/23   [provider]  cloNIDine  (CATAPRES ) 0.1 MG tablet Take 1 tablet (0.1 mg total) by mouth 2 (two) times daily. Patient taking differently: Take 0.1 mg by mouth at bedtime. 02/04/23   Gonfa, Taye T, MD  desvenlafaxine (PRISTIQ) 50 MG 24 hr tablet Take 50 mg by mouth daily. Patient not taking: Reported on 07/09/2023 03/05/23   [provider]  finasteride  (PROSCAR ) 5 MG tablet Take 5 mg by mouth at bedtime.    [provider]  folic acid  (FOLVITE ) 1 MG tablet Take 1 tablet (1 mg total) by mouth daily. 03/01/23   Krishnan, Gokul, MD  gabapentin  (NEURONTIN ) 300 MG capsule Take 300 mg by mouth at bedtime.    [provider]  hydrOXYzine  (ATARAX ) 50 MG tablet Take 50 mg by mouth at bedtime.    [provider]  lactulose  (CHRONULAC ) 10 GM/15ML solution SMARTSIG:Milliliter(s) By Mouth 08/24/23   [provider]  magnesium  hydroxide (MILK OF MAGNESIA) 400 MG/5ML suspension Take 30 mLs by mouth every other day.     [provider]  metoprolol  tartrate (LOPRESSOR ) 50 MG tablet Take 50 mg by mouth 2 (two) times daily.    [provider]  mirtazapine  (REMERON ) 7.5 MG tablet Take 7.5 mg by mouth at bedtime. 08/22/23   [provider]  omeprazole  (PRILOSEC) 20 MG capsule Take 1 capsule (20 mg total) by mouth 2 (two) times daily before a meal. 08/31/23   Charlyn Sora, MD  oxybutynin  (DITROPAN ) 5 MG tablet Take 1 tablet (5 mg total) by mouth 2 (two) times daily as needed for bladder spasms. Patient not taking: Reported on 07/09/2023 03/27/23   Nieves Cough, MD  polyethylene glycol (MIRALAX ) 17 g packet Take 17 g by mouth daily. 08/31/23   Charlyn Sora, MD  QUEtiapine  (SEROQUEL ) 300 MG tablet Take 300 mg by mouth at bedtime. 12/02/22   [provider]  rosuvastatin  (CRESTOR ) 20 MG tablet Take 20 mg by mouth every Monday, Wednesday, and Friday. 08/23/21   [provider]  sucralfate  (CARAFATE ) 1 g tablet Take 1 tablet (1 g total) by mouth 2 (two) times daily before a meal. 08/31/23   Charlyn Sora, MD  tamsulosin  (FLOMAX ) 0.4 MG CAPS capsule Take 0.4 mg by mouth in the morning and at bedtime.    [provider]  thiamine  (VITAMIN B-1) 100 MG tablet Take 1 tablet (100 mg total) by mouth daily. 04/13/23   Krishnan, Gokul, MD    Allergies: Patient has no known allergies.  Review of Systems  Psychiatric/Behavioral:  Positive for hallucinations.     Updated Vital Signs BP (!) 140/88   Pulse (!) 109   Temp 97.8 F (36.6 C) (Oral)   Resp 18   Ht 5' 11 (1.803 m)   Wt 95.3 kg   SpO2 95%   BMI 29.29 kg/m   Physical Exam Vitals and nursing note reviewed.  Constitutional:      General: He is not in acute distress.    Appearance: Normal appearance. He is well-developed.  HENT:     Head: Normocephalic and atraumatic.  Eyes:     Extraocular Movements: Extraocular movements intact.     Conjunctiva/sclera: Conjunctivae normal.  Cardiovascular:      Rate and Rhythm: Normal rate and regular rhythm.     Pulses: Normal pulses.  Pulmonary:     Effort: Pulmonary effort is normal. No respiratory distress.  Abdominal:     Palpations: Abdomen is soft.     Tenderness: There is no abdominal tenderness.  Musculoskeletal:        General: No swelling.     Cervical back: Neck supple.  Skin:    General: Skin is warm and dry.     Capillary Refill: Capillary refill takes less than 2 seconds.  Neurological:     General: No focal deficit present.     Mental Status: He is alert and oriented to person, place, and time.  Psychiatric:        Mood and Affect: Mood normal.     (all labs ordered are listed, but only abnormal results are displayed) Labs Reviewed  CBC  URINE DRUG SCREEN  ETHANOL  COMPREHENSIVE METABOLIC PANEL WITH GFR    EKG: None  Radiology: No results found.   Procedures   Medications Ordered in the ED  LORazepam  (ATIVAN ) tablet 1-4 mg ( Oral See Alternative 12/15/23 1236)    Or  LORazepam  (ATIVAN ) injection 1-4 mg (1 mg Intravenous Given 12/15/23 1236)  thiamine  (VITAMIN B1) tablet 100 mg (has no administration in time range)    Or  thiamine  (VITAMIN B1) injection 100 mg (has no administration in time range)  folic acid  (FOLVITE ) tablet 1 mg (has no administration in time range)  multivitamin with minerals tablet 1 tablet (has no administration in time range)  ziprasidone  (GEODON ) injection 20 mg (20 mg Intramuscular Given 12/15/23 1130)                                    Medical Decision Making Amount and/or Complexity of Data Reviewed Labs: ordered.  Risk OTC drugs. Prescription drug management.   This patient presents to the ED for concern of alcohol  intoxication, this involves an extensive number of treatment options, and is a complaint that carries with it a high risk of complications and morbidity.  The differential diagnosis includes delirium tremens, alcohol  withdrawal, seizures, hallucination   Co  morbidities / Chronic conditions that complicate the patient evaluation  Alcohol  use disorder   Additional history obtained:  Additional history obtained from EMR External records from outside source obtained and reviewed including Care Everywhere   Lab Tests:  I Ordered, and personally interpreted labs.  The pertinent results include: CBC unremarkable   Problem List / ED Course / Critical interventions / Medication management I ordered medication including Geodon  and CIWA protocol I have reviewed the patients home medicines and have made adjustments as needed Patient alert and oriented  and asking to AMA at this time.  Patient still adamantly denying SI/HI/AVH.  Patient wishes to leave AGAINST MEDICAL ADVICE. I personally explained need for further testing and my concerns for adverse outcomes if workup is incomplete. Specific concerns explained to patient include worsening symptoms, functional loss, long term sickness and death. Patient states understanding of risks and states they will return if they feel the need to at a later date to receive the recommended care or any other care at any time, regardless of their ability to pay for such care. Patient understands they are able to return at any time. Patient is able to explain back the risks of leaving AMA and still wishes to leave.   Specifically I recommend labs and EKG to evaluate and exclude alcohol  withdrawal, anemia, electrolyte abnormality.  The patient is oriented to person, place, and time, has the capacity to make decisions regarding the medical care offered. The patient speaks coherently and exhibits no evidence of having an altered level of consciousness or alcohol  or drug intoxication to a point that would impair judgment. They respond knowingly to questions about recommended treatment and alternate treatments including no further testing or treatment; participate in diagnostic and treatment decisions by means of rational  thought processes; and understand the items of minimum basic medical treatment information with respect to that treatment (the nature and seriousness of the illness, the nature of the treatment, the probable degree and duration of any benefits and risks of any medical intervention that is being recommended, and the consequences of lack of treatment, and the nature, risks, and benefits of any reasonable alternatives).  The patient understands the relevant information of the nature of their medical condition, as well as the risks, benefits, and treatment alternatives (including non-treatment), consequences of refusing care, and can competently communicate a rational explanation about their choice of care options.    Included in AVS was the following message:  You have chosen to leave AGAINST MEDICAL ADVICE. Should you change your mind, you are always welcome and encouraged to return to the ED. You are encouraged to follow-up with, at the very least, a primary care provider, or other similar medical professional on this matter.      Final diagnoses:  Alcoholic intoxication with complication [facility])    ED Discharge Orders     None          Francis Ileana SAILOR, PA-C 12/15/23 1359    Tegeler, Lonni PARAS, MD 12/15/23 1531

## 2023-12-15 NOTE — ED Triage Notes (Signed)
 Per EMS from home. Alcohol  intoxication.  BP 160/90 HR 86 RR 18 94 on RA CBG 96

## 2023-12-15 NOTE — ED Triage Notes (Signed)
 Pt bib EMS from home. Neighbors found pt appearing drunk and hallucinating and called EMS.

## 2023-12-16 ENCOUNTER — Emergency Department (HOSPITAL_COMMUNITY)

## 2023-12-16 ENCOUNTER — Encounter (HOSPITAL_COMMUNITY): Payer: Self-pay | Admitting: Emergency Medicine

## 2023-12-16 ENCOUNTER — Encounter (HOSPITAL_COMMUNITY): Payer: Self-pay

## 2023-12-16 ENCOUNTER — Other Ambulatory Visit: Payer: Self-pay

## 2023-12-16 ENCOUNTER — Emergency Department (HOSPITAL_COMMUNITY)
Admission: EM | Admit: 2023-12-16 | Discharge: 2023-12-16 | Discharge status: Against Medical Advice | Attending: Emergency Medicine | Admitting: Emergency Medicine

## 2023-12-16 DIAGNOSIS — Z79899 Other long term (current) drug therapy: Secondary | ICD-10-CM | POA: Insufficient documentation

## 2023-12-16 DIAGNOSIS — J329 Chronic sinusitis, unspecified: Secondary | ICD-10-CM | POA: Insufficient documentation

## 2023-12-16 DIAGNOSIS — R4182 Altered mental status, unspecified: Secondary | ICD-10-CM | POA: Diagnosis not present

## 2023-12-16 DIAGNOSIS — K219 Gastro-esophageal reflux disease without esophagitis: Secondary | ICD-10-CM | POA: Diagnosis not present

## 2023-12-16 DIAGNOSIS — I10 Essential (primary) hypertension: Secondary | ICD-10-CM | POA: Diagnosis not present

## 2023-12-16 DIAGNOSIS — J9811 Atelectasis: Secondary | ICD-10-CM | POA: Insufficient documentation

## 2023-12-16 DIAGNOSIS — Y908 Blood alcohol level of 240 mg/100 ml or more: Secondary | ICD-10-CM | POA: Diagnosis not present

## 2023-12-16 DIAGNOSIS — Y907 Blood alcohol level of 200-239 mg/100 ml: Secondary | ICD-10-CM | POA: Insufficient documentation

## 2023-12-16 DIAGNOSIS — J90 Pleural effusion, not elsewhere classified: Secondary | ICD-10-CM | POA: Diagnosis not present

## 2023-12-16 DIAGNOSIS — F10920 Alcohol use, unspecified with intoxication, uncomplicated: Secondary | ICD-10-CM | POA: Insufficient documentation

## 2023-12-16 DIAGNOSIS — R464 Slowness and poor responsiveness: Secondary | ICD-10-CM | POA: Diagnosis not present

## 2023-12-16 DIAGNOSIS — I672 Cerebral atherosclerosis: Secondary | ICD-10-CM | POA: Diagnosis not present

## 2023-12-16 DIAGNOSIS — F10129 Alcohol abuse with intoxication, unspecified: Secondary | ICD-10-CM | POA: Insufficient documentation

## 2023-12-16 DIAGNOSIS — E8729 Other acidosis: Secondary | ICD-10-CM | POA: Insufficient documentation

## 2023-12-16 DIAGNOSIS — M160 Bilateral primary osteoarthritis of hip: Secondary | ICD-10-CM | POA: Diagnosis not present

## 2023-12-16 DIAGNOSIS — R55 Syncope and collapse: Secondary | ICD-10-CM | POA: Diagnosis not present

## 2023-12-16 DIAGNOSIS — R519 Headache, unspecified: Secondary | ICD-10-CM | POA: Insufficient documentation

## 2023-12-16 DIAGNOSIS — F10929 Alcohol use, unspecified with intoxication, unspecified: Secondary | ICD-10-CM

## 2023-12-16 DIAGNOSIS — R0602 Shortness of breath: Secondary | ICD-10-CM | POA: Diagnosis not present

## 2023-12-16 DIAGNOSIS — H6123 Impacted cerumen, bilateral: Secondary | ICD-10-CM | POA: Diagnosis not present

## 2023-12-16 DIAGNOSIS — E785 Hyperlipidemia, unspecified: Secondary | ICD-10-CM | POA: Diagnosis not present

## 2023-12-16 DIAGNOSIS — R404 Transient alteration of awareness: Secondary | ICD-10-CM | POA: Diagnosis not present

## 2023-12-16 LAB — CBC
HCT: 47 % (ref 39.0–52.0)
Hemoglobin: 15.7 g/dL (ref 13.0–17.0)
MCH: 29.8 pg (ref 26.0–34.0)
MCHC: 33.4 g/dL (ref 30.0–36.0)
MCV: 89.2 fL (ref 80.0–100.0)
Platelets: 149 K/uL — ABNORMAL LOW (ref 150–400)
RBC: 5.27 MIL/uL (ref 4.22–5.81)
RDW: 15.4 % (ref 11.5–15.5)
WBC: 5.9 K/uL (ref 4.0–10.5)
nRBC: 0 % (ref 0.0–0.2)

## 2023-12-16 LAB — URINALYSIS, ROUTINE W REFLEX MICROSCOPIC
Bacteria, UA: NONE SEEN
Bilirubin Urine: NEGATIVE
Glucose, UA: NEGATIVE mg/dL
Ketones, ur: 20 mg/dL — AB
Leukocytes,Ua: NEGATIVE
Nitrite: NEGATIVE
Protein, ur: NEGATIVE mg/dL
Specific Gravity, Urine: 1.01 (ref 1.005–1.030)
pH: 5 (ref 5.0–8.0)

## 2023-12-16 LAB — COMPREHENSIVE METABOLIC PANEL WITH GFR
ALT: 31 U/L (ref 0–44)
ALT: 34 U/L (ref 0–44)
AST: 36 U/L (ref 15–41)
AST: 36 U/L (ref 15–41)
Albumin: 3.8 g/dL (ref 3.5–5.0)
Albumin: 4.3 g/dL (ref 3.5–5.0)
Alkaline Phosphatase: 66 U/L (ref 38–126)
Alkaline Phosphatase: 81 U/L (ref 38–126)
Anion gap: 22 — ABNORMAL HIGH (ref 5–15)
Anion gap: 18 — ABNORMAL HIGH (ref 5–15)
BUN: 10 mg/dL (ref 8–23)
BUN: 12 mg/dL (ref 8–23)
CO2: 17 mmol/L — ABNORMAL LOW (ref 22–32)
CO2: 21 mmol/L — ABNORMAL LOW (ref 22–32)
Calcium: 8.7 mg/dL — ABNORMAL LOW (ref 8.9–10.3)
Calcium: 9 mg/dL (ref 8.9–10.3)
Chloride: 103 mmol/L (ref 98–111)
Chloride: 103 mmol/L (ref 98–111)
Creatinine, Ser: 0.93 mg/dL (ref 0.61–1.24)
Creatinine, Ser: 1.01 mg/dL (ref 0.61–1.24)
GFR, Estimated: >60 mL/min (ref >60)
GFR, Estimated: >60 mL/min (ref >60)
Glucose, Bld: 87 mg/dL (ref 70–99)
Glucose, Bld: 151 mg/dL — ABNORMAL HIGH (ref 70–99)
Potassium: 4.4 mmol/L (ref 3.5–5.1)
Potassium: 4.3 mmol/L (ref 3.5–5.1)
Sodium: 142 mmol/L (ref 135–145)
Sodium: 142 mmol/L (ref 135–145)
Total Bilirubin: 0.8 mg/dL (ref 0.0–1.2)
Total Bilirubin: 0.4 mg/dL (ref 0.0–1.2)
Total Protein: 7.1 g/dL (ref 6.5–8.1)
Total Protein: 7.1 g/dL (ref 6.5–8.1)

## 2023-12-16 LAB — BASIC METABOLIC PANEL WITH GFR
Anion gap: 23 — ABNORMAL HIGH (ref 5–15)
BUN: 11 mg/dL (ref 8–23)
CO2: 15 mmol/L — ABNORMAL LOW (ref 22–32)
Calcium: 8.3 mg/dL — ABNORMAL LOW (ref 8.9–10.3)
Chloride: 104 mmol/L (ref 98–111)
Creatinine, Ser: 0.96 mg/dL (ref 0.61–1.24)
GFR, Estimated: >60 mL/min (ref >60)
Glucose, Bld: 107 mg/dL — ABNORMAL HIGH (ref 70–99)
Potassium: 4.5 mmol/L (ref 3.5–5.1)
Sodium: 142 mmol/L (ref 135–145)

## 2023-12-16 LAB — CBC WITH DIFFERENTIAL/PLATELET
Abs Immature Granulocytes: 0.02 K/uL (ref 0.00–0.07)
Basophils Absolute: 0.1 K/uL (ref 0.0–0.1)
Basophils Relative: 1 %
Eosinophils Absolute: 0 K/uL (ref 0.0–0.5)
Eosinophils Relative: 0 %
HCT: 47.7 % (ref 39.0–52.0)
Hemoglobin: 15.4 g/dL (ref 13.0–17.0)
Immature Granulocytes: 0 %
Lymphocytes Relative: 13 %
Lymphs Abs: 0.9 K/uL (ref 0.7–4.0)
MCH: 28.7 pg (ref 26.0–34.0)
MCHC: 32.3 g/dL (ref 30.0–36.0)
MCV: 89 fL (ref 80.0–100.0)
Monocytes Absolute: 0.7 K/uL (ref 0.1–1.0)
Monocytes Relative: 11 %
Neutro Abs: 5.1 K/uL (ref 1.7–7.7)
Neutrophils Relative %: 75 %
Platelets: 156 K/uL (ref 150–400)
RBC: 5.36 MIL/uL (ref 4.22–5.81)
RDW: 15.7 % — ABNORMAL HIGH (ref 11.5–15.5)
WBC: 6.8 K/uL (ref 4.0–10.5)
nRBC: 0 % (ref 0.0–0.2)

## 2023-12-16 LAB — RAPID URINE DRUG SCREEN, HOSP PERFORMED
Amphetamines: NOT DETECTED
Barbiturates: NOT DETECTED
Benzodiazepines: NOT DETECTED
Cocaine: NOT DETECTED
Opiates: NOT DETECTED
Tetrahydrocannabinol: NOT DETECTED

## 2023-12-16 LAB — TROPONIN I (HIGH SENSITIVITY): Troponin I (High Sensitivity): 15 ng/L (ref <18)

## 2023-12-16 LAB — CBG MONITORING, ED: Glucose-Capillary: 93 mg/dL (ref 70–99)

## 2023-12-16 LAB — ETHANOL
Alcohol, Ethyl (B): 292 mg/dL — ABNORMAL HIGH (ref <15)
Alcohol, Ethyl (B): 220 mg/dL — ABNORMAL HIGH (ref <15)

## 2023-12-16 MED ORDER — THIAMINE HCL 100 MG/ML IJ SOLN: 100.0000 mg | Freq: Every day | INTRAMUSCULAR | Status: DC

## 2023-12-16 MED ORDER — THIAMINE MONONITRATE 100 MG PO TABS
100.0000 mg | ORAL_TABLET | Freq: Every day | ORAL | Status: DC
  Administered 2023-12-16: 100 mg via ORAL
  Filled 2023-12-16: qty 1

## 2023-12-16 MED ORDER — LORAZEPAM 1 MG PO TABS: 1.0000 mg | ORAL_TABLET | ORAL | Status: DC | PRN

## 2023-12-16 MED ORDER — LORAZEPAM 2 MG/ML IJ SOLN
1.0000 mg | INTRAMUSCULAR | Status: DC | PRN
  Administered 2023-12-16: 1 mg via INTRAVENOUS
  Filled 2023-12-16: qty 1

## 2023-12-16 MED ORDER — LACTATED RINGERS IV SOLN: INTRAVENOUS | Status: DC

## 2023-12-16 MED ORDER — LACTATED RINGERS IV BOLUS
1000.0000 mL | Freq: Once | INTRAVENOUS | Status: AC
  Administered 2023-12-16: 1000 mL via INTRAVENOUS

## 2023-12-16 MED ORDER — LACTATED RINGERS IV BOLUS: 1000.0000 mL | Freq: Once | INTRAVENOUS | Status: DC

## 2023-12-16 MED ORDER — FOLIC ACID 1 MG PO TABS
1.0000 mg | ORAL_TABLET | Freq: Every day | ORAL | Status: DC
  Administered 2023-12-16: 1 mg via ORAL
  Filled 2023-12-16: qty 1

## 2023-12-16 MED ORDER — ADULT MULTIVITAMIN W/MINERALS CH
1.0000 | ORAL_TABLET | Freq: Every day | ORAL | Status: DC
  Administered 2023-12-16: 1 via ORAL
  Filled 2023-12-16: qty 1

## 2023-12-16 MED ORDER — ONDANSETRON HCL 4 MG/2ML IJ SOLN
4.0000 mg | Freq: Once | INTRAMUSCULAR | Status: DC | PRN
  Filled 2023-12-16: qty 2

## 2023-12-16 NOTE — ED Notes (Signed)
 Patient transported to CT

## 2023-12-16 NOTE — ED Notes (Addendum)
 Patient came to desk and states I want to get all this stuff off and go home. IV removed, cardiac monitor cords disconnected. Patient returned to room with steady gait and encouraged to stay. Refuses and states he will call an [place].

## 2023-12-16 NOTE — ED Provider Notes (Signed)
 [place] EMERGENCY DEPARTMENT AT [facility] Provider Note   CSN: [id number] Arrival date & time: 12/16/23  1958     Patient presents with: Alcohol  Intoxication   Ruben [name] is a 73 y.o. male.   HPI 73 year old male presents with alcohol  intoxication.  History is from patient and EMS.  EMS reports the patient was taking an Ruben [name] back home from the bar and reportedly went unconscious so the [place] driver called [id number].  The patient was alert on their arrival but was too intoxicated to walk and so they brought him in to the ER.  Patient tells me that he drank alcohol , went to [facility] where they did not do anything and then he left.  He drank more and then went to the bar and drink more.  He took an Ruben [name] home and then ended up in the hospital.  He states he is feeling bad from the alcohol  and has nausea and dizziness.  He denies headache, chest pain, shortness of breath, vomiting.  He is asking for IV Ativan  to help.  Prior to Admission medications   Medication Sig Start Date End Date Taking? Authorizing Provider  albuterol  (VENTOLIN  HFA) 108 (90 Base) MCG/ACT inhaler Inhale 2 puffs into the lungs every 6 (six) hours as needed. 05/24/23   [provider]  cloNIDine  (CATAPRES ) 0.1 MG tablet Take 1 tablet (0.1 mg total) by mouth 2 (two) times daily. Patient taking differently: Take 0.1 mg by mouth at bedtime. 02/04/23   Gonfa, Taye T, MD  desvenlafaxine (PRISTIQ) 50 MG 24 hr tablet Take 50 mg by mouth as needed. 03/05/23   [provider]  finasteride  (PROSCAR ) 5 MG tablet Take 5 mg by mouth in the morning.    [provider]  gabapentin  (NEURONTIN ) 300 MG capsule Take 300 mg by mouth at bedtime.    [provider]  hydrOXYzine  (ATARAX ) 50 MG tablet Take 50 mg by mouth at bedtime as needed for anxiety.    [provider]  lactulose  (CHRONULAC ) 10 GM/15ML solution Take 10 g by mouth every other day. 08/24/23   [provider]   magnesium  hydroxide (MILK OF MAGNESIA) 400 MG/5ML suspension Take 30 mLs by mouth every other day.    [provider]  metoprolol  tartrate (LOPRESSOR ) 50 MG tablet Take 50 mg by mouth 2 (two) times daily.    [provider]  mirtazapine  (REMERON ) 7.5 MG tablet Take 7.5 mg by mouth at bedtime as needed (for sleep and anxiety). 08/22/23   [provider]  naltrexone (DEPADE) 50 MG tablet Take 50 mg by mouth 2 (two) times daily. 12/12/23   [provider]  QUEtiapine  (SEROQUEL ) 300 MG tablet Take 300 mg by mouth at bedtime. 12/02/22   [provider]  rosuvastatin  (CRESTOR ) 20 MG tablet Take 20 mg by mouth every Monday, Wednesday, and Friday. 08/23/21   [provider]  tamsulosin  (FLOMAX ) 0.4 MG CAPS capsule Take 0.4 mg by mouth in the morning.    [provider]  thiamine  (VITAMIN B-1) 100 MG tablet Take 1 tablet (100 mg total) by mouth daily. Patient taking differently: Take 100 mg by mouth in the morning. 04/13/23   Krishnan, Gokul, MD  traZODone  (DESYREL ) 50 MG tablet Take 50 mg by mouth at bedtime as needed for sleep. 10/25/23   [provider]    Allergies: Patient has no known allergies.    Review of Systems  Respiratory:  Negative for shortness of breath.  Cardiovascular:  Negative for chest pain.  Gastrointestinal:  Positive for nausea. Negative for abdominal pain and vomiting.  Neurological:  Positive for dizziness. Negative for headaches.    Updated Vital Signs BP (!) 145/95   Pulse (!) 102   Temp 98.5 F (36.9 C)   Resp 17   Ht 5' 11 (1.803 m)   Wt 95.3 kg   SpO2 94%   BMI 29.29 kg/m   Physical Exam Vitals and nursing note reviewed.  Constitutional:      Appearance: He is well-developed.  HENT:     Head: Normocephalic and atraumatic.  Cardiovascular:     Rate and Rhythm: Normal rate and regular rhythm.     Heart sounds: Normal heart sounds.     Comments: HR~100 Pulmonary:     Effort: Pulmonary  effort is normal.     Breath sounds: Normal breath sounds.  Abdominal:     General: There is no distension.     Palpations: Abdomen is soft.     Tenderness: There is no abdominal tenderness.  Musculoskeletal:     Cervical back: No rigidity.  Skin:    General: Skin is warm and dry.  Neurological:     Mental Status: He is alert.     Comments: Awake, alert, grossly normal strength and sensation in all 4 extremities.     (all labs ordered are listed, but only abnormal results are displayed) Labs Reviewed  COMPREHENSIVE METABOLIC PANEL WITH GFR - Abnormal; Notable for the following components:      Result Value   CO2 21 (*)    Glucose, Bld 151 (*)    Anion gap 18 (*)    All other components within normal limits  ETHANOL - Abnormal; Notable for the following components:   Alcohol , Ethyl (B) 220 (*)    All other components within normal limits  CBC WITH DIFFERENTIAL/PLATELET - Abnormal; Notable for the following components:   RDW 15.7 (*)    All other components within normal limits  BLOOD GAS, VENOUS    ED ECG REPORT   Date: 12/16/2023  Rate: 98  Rhythm: normal sinus rhythm  QRS Axis: normal  Intervals: normal  ST/T Wave abnormalities: normal  Conduction Disutrbances:none  Narrative Interpretation:    I have personally reviewed the EKG tracing and agree with the computerized printout as noted.   Radiology: DG Pelvis Portable Result Date: 12/16/2023 CLINICAL DATA:  Altered mental status.  Shortness of breath. EXAM: PORTABLE PELVIS 1-2 VIEWS COMPARISON:  None. FINDINGS: No evidence for an acute fracture. SI joints and symphysis pubis unremarkable. Mild degenerative changes are noted in both hips. IMPRESSION: 1. No acute bony findings. 2. Mild degenerative changes in both hips. Electronically Signed   By: Camellia Candle M.D.   On: 12/16/2023 07:37   DG Chest 2 View Result Date: 12/16/2023 CLINICAL DATA:  Shortness of breath and altered mental status. EXAM: CHEST - 2 VIEW  COMPARISON:  08/31/2023 FINDINGS: Interval improvement in basilar aeration with increased lung expansion since the prior study. There is some persistent basilar atelectasis, left greater than right with tiny pleural effusion noted on the lateral film. Cardiopericardial silhouette is at upper limits of normal for size. No acute bony abnormality. Telemetry leads overlie the chest. IMPRESSION: Interval improvement in basilar aeration with persistent basilar atelectasis and tiny pleural effusion. Electronically Signed   By: Camellia Candle M.D.   On: 12/16/2023 07:36   CT HEAD WO CONTRAST Result Date: 12/16/2023 CLINICAL DATA:  Mental status change,  unknown cause. EXAM: CT HEAD WITHOUT CONTRAST TECHNIQUE: Contiguous axial images were obtained from the base of the skull through the vertex without intravenous contrast. RADIATION DOSE REDUCTION: This exam was performed according to the departmental dose-optimization program which includes automated exposure control, adjustment of the mA and/or kV according to patient size and/or use of iterative reconstruction technique. COMPARISON:  Head CT 09/01/2023. FINDINGS: Brain: There is mild cerebral atrophy and small-vessel disease. Unremarkable cerebellum and brainstem. The ventricles are normal in size and position. No cortical based acute infarct, hemorrhage, mass or mass effect is seen. There is no midline shift.  The basal cisterns are clear. Vascular: Patchy calcific plaque both siphons, distal left vertebral artery. No hyperdense central vessel is seen. Skull: Negative for fractures or focal lesions. Sinuses/Orbits: There is mild chronic membrane disease in the ethmoids, 1.8 cm retention cyst or polyp in the right frontal sinus and mild membrane thickening in the maxillary cavities. The other sinuses, bilateral mastoid air cells and middle ears are clear. Wax impactions are again noted in both external auditory canals. Negative orbits. Other: None. IMPRESSION: 1. No acute  intracranial CT findings or interval changes. 2. Sinus disease. 3. Wax impactions in both external auditory canals. Electronically Signed   By: Francis Quam M.D.   On: 12/16/2023 07:17     Procedures   Medications Ordered in the ED  ondansetron  (ZOFRAN ) injection 4 mg (has no administration in time range)  lactated ringers  bolus 1,000 mL (has no administration in time range)  lactated ringers  bolus 1,000 mL (1,000 mLs Intravenous New Bag/Given 12/16/23 2101)                                    Medical Decision Making Amount and/or Complexity of Data Reviewed Labs: ordered.    Details: Evaded alcohol  level, elevated anion gap. ECG/medicine tests: ordered and independent interpretation performed.    Details: Sinus rhythm, no ischemia  Risk Prescription drug management.   Patient presents with recurrent alcohol  intoxication.  Here he is alert and overall well-appearing.  Some borderline tachycardia.  He was given some fluids.  Labs were checked as his labs earlier today showed an anion gap acidosis and he has had recurrent admissions for ketoacidosis in the past.  He is requesting Ativan  though I do not think he is actively withdrawing and I do not think this is necessary.  He was given some fluids.  Upon seeing his blood work, I did order a VBG as his anion gap is elevated but improved from before.  Unfortunately, I was then informed that he had actually walked out of the emergency department and I was unable to reassess him.     Final diagnoses:  Alcoholic intoxication with complication [facility])    ED Discharge Orders     None          Ruben Hamilton, MD 12/16/23 2206

## 2023-12-16 NOTE — ED Provider Notes (Signed)
 [place] EMERGENCY DEPARTMENT AT [facility] Provider Note   CSN: [id number] Arrival date & time: 12/16/23  9378     Patient presents with: Alcohol  Intoxication   Ruben [name] is a 73 y.o. male with PMHx GERD, HTN, ETOH use disorder, HLD, who presents to the ED concerned for AMS. Patient stating that he called 911 this morning to get a hold of Dr. Burnard? Patient unable to tell me what type of provider Dr. Burnard is. Patient telling triage nurse that Dr. Burnard lives in [place]. Patient was able to answer all orienting questions during my interview, but having difficulty going into further detail about his recent history. Patient currently denies fever, cough, nausea, vomiting, diarrhea. Denies chest pain. Patient does endorse chronic SOB and stating that he follows along with outpatient providers for this complaint but has had reassuring workups in the past.   EMS found patient with many empty bottles of ETOH around him.    HPI     Prior to Admission medications   Medication Sig Start Date End Date Taking? Authorizing Provider  albuterol  (VENTOLIN  HFA) 108 (90 Base) MCG/ACT inhaler Inhale 2 puffs into the lungs every 6 (six) hours as needed. 05/24/23  Yes [provider]  cloNIDine  (CATAPRES ) 0.1 MG tablet Take 1 tablet (0.1 mg total) by mouth 2 (two) times daily. Patient taking differently: Take 0.1 mg by mouth at bedtime. 02/04/23  Yes Gonfa, Taye T, MD  desvenlafaxine (PRISTIQ) 50 MG 24 hr tablet Take 50 mg by mouth as needed. 03/05/23  Yes [provider]  finasteride  (PROSCAR ) 5 MG tablet Take 5 mg by mouth in the morning.   Yes [provider]  gabapentin  (NEURONTIN ) 300 MG capsule Take 300 mg by mouth at bedtime.   Yes [provider]  hydrOXYzine  (ATARAX ) 50 MG tablet Take 50 mg by mouth at bedtime as needed for anxiety.   Yes [provider]  lactulose  (CHRONULAC ) 10 GM/15ML solution Take 10 g by mouth every other day. 08/24/23   Yes [provider]  magnesium  hydroxide (MILK OF MAGNESIA) 400 MG/5ML suspension Take 30 mLs by mouth every other day.   Yes [provider]  metoprolol  tartrate (LOPRESSOR ) 50 MG tablet Take 50 mg by mouth 2 (two) times daily.   Yes [provider]  mirtazapine  (REMERON ) 7.5 MG tablet Take 7.5 mg by mouth at bedtime as needed (for sleep and anxiety). 08/22/23  Yes [provider]  naltrexone (DEPADE) 50 MG tablet Take 50 mg by mouth 2 (two) times daily. 12/12/23  Yes [provider]  QUEtiapine  (SEROQUEL ) 300 MG tablet Take 300 mg by mouth at bedtime. 12/02/22  Yes [provider]  rosuvastatin  (CRESTOR ) 20 MG tablet Take 20 mg by mouth every Monday, Wednesday, and Friday. 08/23/21  Yes [provider]  tamsulosin  (FLOMAX ) 0.4 MG CAPS capsule Take 0.4 mg by mouth in the morning.   Yes [provider]  thiamine  (VITAMIN B-1) 100 MG tablet Take 1 tablet (100 mg total) by mouth daily. Patient taking differently: Take 100 mg by mouth in the morning. 04/13/23  Yes Krishnan, Gokul, MD  traZODone  (DESYREL ) 50 MG tablet Take 50 mg by mouth at bedtime as needed for sleep. 10/25/23  Yes [provider]    Allergies: Patient has no known allergies.    Review of Systems  Psychiatric/Behavioral:  Positive for confusion.     Updated Vital Signs BP (!) 149/85   Pulse (!) 111  Temp 97.7 F (36.5 C) (Axillary)   Resp 13   Wt 95.3 kg   SpO2 99%   BMI 29.29 kg/m   Physical Exam Vitals and nursing note reviewed.  Constitutional:      General: He is not in acute distress.    Appearance: He is not ill-appearing or toxic-appearing.  HENT:     Head: Normocephalic and atraumatic.     Mouth/Throat:     Mouth: Mucous membranes are moist.  Eyes:     General: No scleral icterus.       Right eye: No discharge.        Left eye: No discharge.     Conjunctiva/sclera: Conjunctivae normal.  Cardiovascular:     Rate and Rhythm:  Normal rate and regular rhythm.     Pulses: Normal pulses.     Heart sounds: Normal heart sounds. No murmur heard. Pulmonary:     Effort: Pulmonary effort is normal. No respiratory distress.     Breath sounds: Normal breath sounds. No wheezing, rhonchi or rales.  Abdominal:     General: Abdomen is flat. Bowel sounds are normal. There is no distension.     Palpations: Abdomen is soft. There is no mass.     Tenderness: There is no abdominal tenderness.  Musculoskeletal:     Right lower leg: No edema.     Left lower leg: No edema.  Skin:    General: Skin is warm and dry.     Findings: No rash.  Neurological:     General: No focal deficit present.     Mental Status: He is alert. Mental status is at baseline.     Comments: Patient is able to answer all orienting questions, but does show mild confusion when asking further history questions. Slight stuttering. GCS 15. Sensation to light touch intact.   Psychiatric:        Mood and Affect: Mood normal.     (all labs ordered are listed, but only abnormal results are displayed) Labs Reviewed  COMPREHENSIVE METABOLIC PANEL WITH GFR - Abnormal; Notable for the following components:      Result Value   CO2 17 (*)    Calcium  8.7 (*)    Anion gap 22 (*)    All other components within normal limits  CBC - Abnormal; Notable for the following components:   Platelets 149 (*)    All other components within normal limits  URINALYSIS, ROUTINE W REFLEX MICROSCOPIC - Abnormal; Notable for the following components:   Hgb urine dipstick SMALL (*)    Ketones, ur 20 (*)    All other components within normal limits  ETHANOL - Abnormal; Notable for the following components:   Alcohol , Ethyl (B) 292 (*)    All other components within normal limits  BASIC METABOLIC PANEL WITH GFR - Abnormal; Notable for the following components:   CO2 15 (*)    Glucose, Bld 107 (*)    Calcium  8.3 (*)    Anion gap 23 (*)    All other components within normal limits   RAPID URINE DRUG SCREEN, HOSP PERFORMED  VITAMIN B1  CBG MONITORING, ED  TROPONIN I (HIGH SENSITIVITY)    EKG: EKG Interpretation Date/Time:  Sunday December 16 2023 06:33:19 EDT Ventricular Rate:  76 PR Interval:  166 QRS Duration:  94 QT Interval:  419 QTC Calculation: 472 R Axis:   -9  Text Interpretation: Sinus rhythm Probable left atrial enlargement Confirmed by Jerrol Agent ([id number]) on 12/16/2023 8:28:52  AM  Radiology: DG Pelvis Portable Result Date: 12/16/2023 CLINICAL DATA:  Altered mental status.  Shortness of breath. EXAM: PORTABLE PELVIS 1-2 VIEWS COMPARISON:  None. FINDINGS: No evidence for an acute fracture. SI joints and symphysis pubis unremarkable. Mild degenerative changes are noted in both hips. IMPRESSION: 1. No acute bony findings. 2. Mild degenerative changes in both hips. Electronically Signed   By: Camellia Candle M.D.   On: 12/16/2023 07:37   DG Chest 2 View Result Date: 12/16/2023 CLINICAL DATA:  Shortness of breath and altered mental status. EXAM: CHEST - 2 VIEW COMPARISON:  08/31/2023 FINDINGS: Interval improvement in basilar aeration with increased lung expansion since the prior study. There is some persistent basilar atelectasis, left greater than right with tiny pleural effusion noted on the lateral film. Cardiopericardial silhouette is at upper limits of normal for size. No acute bony abnormality. Telemetry leads overlie the chest. IMPRESSION: Interval improvement in basilar aeration with persistent basilar atelectasis and tiny pleural effusion. Electronically Signed   By: Camellia Candle M.D.   On: 12/16/2023 07:36   CT HEAD WO CONTRAST Result Date: 12/16/2023 CLINICAL DATA:  Mental status change, unknown cause. EXAM: CT HEAD WITHOUT CONTRAST TECHNIQUE: Contiguous axial images were obtained from the base of the skull through the vertex without intravenous contrast. RADIATION DOSE REDUCTION: This exam was performed according to the departmental dose-optimization  program which includes automated exposure control, adjustment of the mA and/or kV according to patient size and/or use of iterative reconstruction technique. COMPARISON:  Head CT 09/01/2023. FINDINGS: Brain: There is mild cerebral atrophy and small-vessel disease. Unremarkable cerebellum and brainstem. The ventricles are normal in size and position. No cortical based acute infarct, hemorrhage, mass or mass effect is seen. There is no midline shift.  The basal cisterns are clear. Vascular: Patchy calcific plaque both siphons, distal left vertebral artery. No hyperdense central vessel is seen. Skull: Negative for fractures or focal lesions. Sinuses/Orbits: There is mild chronic membrane disease in the ethmoids, 1.8 cm retention cyst or polyp in the right frontal sinus and mild membrane thickening in the maxillary cavities. The other sinuses, bilateral mastoid air cells and middle ears are clear. Wax impactions are again noted in both external auditory canals. Negative orbits. Other: None. IMPRESSION: 1. No acute intracranial CT findings or interval changes. 2. Sinus disease. 3. Wax impactions in both external auditory canals. Electronically Signed   By: Francis Quam M.D.   On: 12/16/2023 07:17     Procedures   Medications Ordered in the ED  thiamine  (VITAMIN B1) tablet 100 mg (100 mg Oral Given 12/16/23 0950)    Or  thiamine  (VITAMIN B1) injection 100 mg ( Intravenous See Alternative 12/16/23 0950)  folic acid  (FOLVITE ) tablet 1 mg (1 mg Oral Given 12/16/23 0951)  multivitamin with minerals tablet 1 tablet (1 tablet Oral Given 12/16/23 0951)  LORazepam  (ATIVAN ) tablet 1-4 mg ( Oral See Alternative 12/16/23 0732)    Or  LORazepam  (ATIVAN ) injection 1-4 mg (1 mg Intravenous Given 12/16/23 0732)  lactated ringers  infusion (has no administration in time range)  lactated ringers  bolus 1,000 mL (0 mLs Intravenous Stopped 12/16/23 0950)                                    Medical Decision Making Amount and/or  Complexity of Data Reviewed Labs: ordered. Radiology: ordered.  Risk OTC drugs. Prescription drug management.   This patient presents to  the ED for concern of AMS, this involves an extensive number of treatment options, and is a complaint that carries with it a high risk of complications and morbidity.  The differential diagnosis includes CVA, ICH, intracranial mass, critical dehydration, heptatic dysfunction, uremia, hypercarbia, intoxication/withdrawal, endocrine abnormality, sepsis/infection.   Co morbidities that complicate the patient evaluation  GERD, HTN, ETOH use disorder, HLD   Additional history obtained:  Dr. Marvene PCP Patient seen in ED yesterday. Noted to be fully alert and oriented. Patient left AMA prior to medical workup. 04/2022 ECHO: 60-65% EF   Problem List / ED Course / Critical interventions / Medication management  Patient presented for AMS. EMS noting many empty bottles of ETOH near patient. Patient stating that he called 911 to talk to Dr. Burnard. Patient with slight stutter and is somewhat confused, but is able to correctly answer my orientation questions and calls himself Mr. Ativan . Rest of physical exam is reassuring. Patient afebrile with stable vitals.  I Ordered, and personally interpreted labs.  UA without concern for infection.  Troponin within normal limits.  CBC without leukocytosis or anemia.  CMP with low CO2 of 17 and anion gap of 22.  CBG 93.  EtOH 292.  UDS negative.  I personally viewed and interpreted the EKG/cardiac monitored which showed an underlying rhythm of: Sinus rhythm I ordered imaging studies including chest/pelvis xray and CT head. I independently visualized and interpreted imaging which showed no acute process. During the process of attempting to correct patient's anion gap (suspected from ETOH acidosis) patient walked to front desk and stated that he needed his IV out and was leaving. Please see RN note for more detail. It notes  that patient had steady gait. Patient eloped before I was able to evaluate him again.  I have reviewed the patients home medicines and have made adjustments as needed   Social Determinants of Health:  geriatric        Final diagnoses:  Alcoholic intoxication without complication [facility])    ED Discharge Orders     None          Hoy Nidia FALCON, [place] 12/16/23 1206    Jerrol Agent, MD 12/16/23 1427

## 2023-12-16 NOTE — ED Triage Notes (Signed)
 Pt in from home via [facility] with AMS, left AMA over at [facility] ED yesterday at 2pm, which the assumed LSN. Per EMS, pt has R side weakness, multiple empty bottles of ETOH found beside him at the house. Pt states he is in [place]  VS w/EMS: 70HR 138/92 94%RA CBG 101

## 2023-12-16 NOTE — ED Triage Notes (Signed)
 Pt bib EMS after falling asleep in uber home from bar. Pt seen multiple times in past couple of days for ETOH. Denies SI/HI. VSS. (+) ETOH. Pt unconscious on arrival. CBG 227.

## 2023-12-16 NOTE — ED Notes (Signed)
 Pt stated he wanted to leave . RN removed IV

## 2023-12-19 DIAGNOSIS — F10139 Alcohol abuse with withdrawal, unspecified: Secondary | ICD-10-CM | POA: Diagnosis not present

## 2023-12-19 DIAGNOSIS — M542 Cervicalgia: Secondary | ICD-10-CM | POA: Diagnosis not present

## 2023-12-19 DIAGNOSIS — K219 Gastro-esophageal reflux disease without esophagitis: Secondary | ICD-10-CM | POA: Diagnosis not present

## 2023-12-19 DIAGNOSIS — R45851 Suicidal ideations: Secondary | ICD-10-CM | POA: Diagnosis not present

## 2023-12-19 DIAGNOSIS — F10229 Alcohol dependence with intoxication, unspecified: Secondary | ICD-10-CM | POA: Diagnosis not present

## 2023-12-19 DIAGNOSIS — I10 Essential (primary) hypertension: Secondary | ICD-10-CM | POA: Diagnosis not present

## 2023-12-19 DIAGNOSIS — R569 Unspecified convulsions: Secondary | ICD-10-CM | POA: Diagnosis not present

## 2023-12-19 DIAGNOSIS — F10239 Alcohol dependence with withdrawal, unspecified: Secondary | ICD-10-CM | POA: Diagnosis not present

## 2023-12-19 DIAGNOSIS — R442 Other hallucinations: Secondary | ICD-10-CM | POA: Diagnosis not present

## 2023-12-19 DIAGNOSIS — Z79899 Other long term (current) drug therapy: Secondary | ICD-10-CM | POA: Diagnosis not present

## 2023-12-19 DIAGNOSIS — R519 Headache, unspecified: Secondary | ICD-10-CM | POA: Diagnosis not present

## 2023-12-19 DIAGNOSIS — F331 Major depressive disorder, recurrent, moderate: Secondary | ICD-10-CM | POA: Diagnosis not present

## 2023-12-19 DIAGNOSIS — N400 Enlarged prostate without lower urinary tract symptoms: Secondary | ICD-10-CM | POA: Diagnosis not present

## 2023-12-19 DIAGNOSIS — R55 Syncope and collapse: Secondary | ICD-10-CM | POA: Diagnosis not present

## 2023-12-19 DIAGNOSIS — R0602 Shortness of breath: Secondary | ICD-10-CM | POA: Diagnosis not present

## 2023-12-19 DIAGNOSIS — F1020 Alcohol dependence, uncomplicated: Secondary | ICD-10-CM | POA: Diagnosis not present

## 2023-12-19 DIAGNOSIS — F419 Anxiety disorder, unspecified: Secondary | ICD-10-CM | POA: Diagnosis not present

## 2023-12-19 LAB — VITAMIN B1: Vitamin B1 (Thiamine): 207.4 nmol/L — ABNORMAL HIGH (ref 66.5–200.0)

## 2023-12-20 ENCOUNTER — Ambulatory Visit (HOSPITAL_COMMUNITY): Payer: Self-pay

## 2023-12-26 ENCOUNTER — Ambulatory Visit: Attending: Cardiology | Admitting: Cardiology

## 2023-12-26 NOTE — Progress Notes (Deleted)
  Cardiology Office Note:  .   Date:  12/26/2023  ID:  Ruben [name], DOB 1950/05/06, MRN [id number] PCP: [name], FNP  [facility] Providers Cardiologist:  Newman Lawrence, MD PCP: [name], FNP  No chief complaint on file.    Ruben [name] is a 73 y.o. male with aortic atherosclerosis, h/o alcohol  abuse  Discussed the use of AI scribe software for clinical note transcription with the patient, who gave verbal consent to proceed.  History of Present Illness       There were no vitals filed for this visit.    ROS      Studies Reviewed: .        *** Labs ***/202***: Chol ***, TG ***, HDL ***, LDL *** HbA1C ***% Hb *** Cr *** ***  Risk Assessment/Calculations:   {Does this patient have ATRIAL FIBRILLATION?:[id number]}    Physical Exam   VISIT DIAGNOSES: No diagnosis found.   Ruben [name] is a 73 y.o. male with aortic atherosclerosis, h/o alcohol  abuse  Assessment & Plan       {Are you ordering a CV Procedure (e.g. stress test, cath, DCCV, TEE, etc)?   Press F2        :[id number]}    No orders of the defined types were placed in this encounter.    F/u in ***  Signed, Newman JINNY Lawrence, MD

## 2024-01-07 DIAGNOSIS — F1021 Alcohol dependence, in remission: Secondary | ICD-10-CM | POA: Diagnosis not present

## 2024-01-07 DIAGNOSIS — I700 Atherosclerosis of aorta: Secondary | ICD-10-CM | POA: Diagnosis not present

## 2024-01-07 DIAGNOSIS — E782 Mixed hyperlipidemia: Secondary | ICD-10-CM | POA: Diagnosis not present

## 2024-01-07 DIAGNOSIS — F5101 Primary insomnia: Secondary | ICD-10-CM | POA: Diagnosis not present

## 2024-01-07 DIAGNOSIS — Z09 Encounter for follow-up examination after completed treatment for conditions other than malignant neoplasm: Secondary | ICD-10-CM | POA: Diagnosis not present

## 2024-01-07 DIAGNOSIS — I10 Essential (primary) hypertension: Secondary | ICD-10-CM | POA: Diagnosis not present

## 2024-01-07 DIAGNOSIS — F1010 Alcohol abuse, uncomplicated: Secondary | ICD-10-CM | POA: Diagnosis not present

## 2024-01-07 DIAGNOSIS — D696 Thrombocytopenia, unspecified: Secondary | ICD-10-CM | POA: Diagnosis not present

## 2024-01-10 ENCOUNTER — Encounter (HOSPITAL_COMMUNITY): Payer: Self-pay

## 2024-01-10 ENCOUNTER — Observation Stay (HOSPITAL_COMMUNITY)
Admission: EM | Admit: 2024-01-10 | Discharge: 2024-01-11 | Disposition: A | Discharge status: Home / Self Care | Attending: Internal Medicine | Admitting: Internal Medicine

## 2024-01-10 ENCOUNTER — Other Ambulatory Visit: Payer: Self-pay

## 2024-01-10 ENCOUNTER — Emergency Department (HOSPITAL_COMMUNITY)

## 2024-01-10 DIAGNOSIS — F419 Anxiety disorder, unspecified: Secondary | ICD-10-CM | POA: Insufficient documentation

## 2024-01-10 DIAGNOSIS — M50322 Other cervical disc degeneration at C5-C6 level: Secondary | ICD-10-CM | POA: Insufficient documentation

## 2024-01-10 DIAGNOSIS — N400 Enlarged prostate without lower urinary tract symptoms: Secondary | ICD-10-CM | POA: Insufficient documentation

## 2024-01-10 DIAGNOSIS — F10930 Alcohol use, unspecified with withdrawal, uncomplicated: Secondary | ICD-10-CM | POA: Diagnosis not present

## 2024-01-10 DIAGNOSIS — S199XXA Unspecified injury of neck, initial encounter: Secondary | ICD-10-CM | POA: Diagnosis not present

## 2024-01-10 DIAGNOSIS — E785 Hyperlipidemia, unspecified: Secondary | ICD-10-CM | POA: Diagnosis not present

## 2024-01-10 DIAGNOSIS — M4802 Spinal stenosis, cervical region: Secondary | ICD-10-CM | POA: Diagnosis not present

## 2024-01-10 DIAGNOSIS — R471 Dysarthria and anarthria: Secondary | ICD-10-CM | POA: Insufficient documentation

## 2024-01-10 DIAGNOSIS — Z6828 Body mass index (BMI) 28.0-28.9, adult: Secondary | ICD-10-CM | POA: Insufficient documentation

## 2024-01-10 DIAGNOSIS — Z1152 Encounter for screening for COVID-19: Secondary | ICD-10-CM | POA: Diagnosis not present

## 2024-01-10 DIAGNOSIS — R0602 Shortness of breath: Secondary | ICD-10-CM | POA: Insufficient documentation

## 2024-01-10 DIAGNOSIS — R000 Tachycardia, unspecified: Secondary | ICD-10-CM | POA: Insufficient documentation

## 2024-01-10 DIAGNOSIS — D696 Thrombocytopenia, unspecified: Secondary | ICD-10-CM | POA: Insufficient documentation

## 2024-01-10 DIAGNOSIS — F1290 Cannabis use, unspecified, uncomplicated: Secondary | ICD-10-CM | POA: Insufficient documentation

## 2024-01-10 DIAGNOSIS — K760 Fatty (change of) liver, not elsewhere classified: Secondary | ICD-10-CM | POA: Diagnosis not present

## 2024-01-10 DIAGNOSIS — Z79899 Other long term (current) drug therapy: Secondary | ICD-10-CM | POA: Diagnosis not present

## 2024-01-10 DIAGNOSIS — F10129 Alcohol abuse with intoxication, unspecified: Secondary | ICD-10-CM | POA: Diagnosis not present

## 2024-01-10 DIAGNOSIS — Y908 Blood alcohol level of 240 mg/100 ml or more: Secondary | ICD-10-CM | POA: Insufficient documentation

## 2024-01-10 DIAGNOSIS — S0990XA Unspecified injury of head, initial encounter: Secondary | ICD-10-CM | POA: Diagnosis not present

## 2024-01-10 DIAGNOSIS — R112 Nausea with vomiting, unspecified: Secondary | ICD-10-CM | POA: Diagnosis present

## 2024-01-10 DIAGNOSIS — S3993XA Unspecified injury of pelvis, initial encounter: Secondary | ICD-10-CM | POA: Diagnosis not present

## 2024-01-10 DIAGNOSIS — F10939 Alcohol use, unspecified with withdrawal, unspecified: Secondary | ICD-10-CM | POA: Diagnosis present

## 2024-01-10 DIAGNOSIS — S3991XA Unspecified injury of abdomen, initial encounter: Secondary | ICD-10-CM | POA: Diagnosis not present

## 2024-01-10 DIAGNOSIS — J90 Pleural effusion, not elsewhere classified: Secondary | ICD-10-CM | POA: Insufficient documentation

## 2024-01-10 DIAGNOSIS — E663 Overweight: Secondary | ICD-10-CM | POA: Insufficient documentation

## 2024-01-10 DIAGNOSIS — I10 Essential (primary) hypertension: Secondary | ICD-10-CM | POA: Insufficient documentation

## 2024-01-10 DIAGNOSIS — F10929 Alcohol use, unspecified with intoxication, unspecified: Principal | ICD-10-CM

## 2024-01-10 DIAGNOSIS — E8729 Other acidosis: Secondary | ICD-10-CM | POA: Insufficient documentation

## 2024-01-10 DIAGNOSIS — R110 Nausea: Secondary | ICD-10-CM | POA: Diagnosis not present

## 2024-01-10 DIAGNOSIS — F32A Depression, unspecified: Secondary | ICD-10-CM | POA: Diagnosis not present

## 2024-01-10 LAB — COMPREHENSIVE METABOLIC PANEL WITH GFR
ALT: 30 U/L (ref 0–44)
AST: 38 U/L (ref 15–41)
Albumin: 4.7 g/dL (ref 3.5–5.0)
Alkaline Phosphatase: 93 U/L (ref 38–126)
Anion gap: 26 — ABNORMAL HIGH (ref 5–15)
BUN: 12 mg/dL (ref 8–23)
CO2: 16 mmol/L — ABNORMAL LOW (ref 22–32)
Calcium: 9 mg/dL (ref 8.9–10.3)
Chloride: 101 mmol/L (ref 98–111)
Creatinine, Ser: 1.02 mg/dL (ref 0.61–1.24)
GFR, Estimated: >60 mL/min (ref >60)
Glucose, Bld: 80 mg/dL (ref 70–99)
Potassium: 4.2 mmol/L (ref 3.5–5.1)
Sodium: 143 mmol/L (ref 135–145)
Total Bilirubin: 0.5 mg/dL (ref 0.0–1.2)
Total Protein: 7.9 g/dL (ref 6.5–8.1)

## 2024-01-10 LAB — CBC WITH DIFFERENTIAL/PLATELET
Abs Immature Granulocytes: 0.02 K/uL (ref 0.00–0.07)
Basophils Absolute: 0.1 K/uL (ref 0.0–0.1)
Basophils Relative: 2 %
Eosinophils Absolute: 0.1 K/uL (ref 0.0–0.5)
Eosinophils Relative: 1 %
HCT: 52.4 % — ABNORMAL HIGH (ref 39.0–52.0)
Hemoglobin: 16.7 g/dL (ref 13.0–17.0)
Immature Granulocytes: 0 %
Lymphocytes Relative: 31 %
Lymphs Abs: 2.1 K/uL (ref 0.7–4.0)
MCH: 29 pg (ref 26.0–34.0)
MCHC: 31.9 g/dL (ref 30.0–36.0)
MCV: 91.1 fL (ref 80.0–100.0)
Monocytes Absolute: 0.5 K/uL (ref 0.1–1.0)
Monocytes Relative: 7 %
Neutro Abs: 4 K/uL (ref 1.7–7.7)
Neutrophils Relative %: 59 %
Platelets: 198 K/uL (ref 150–400)
RBC: 5.75 MIL/uL (ref 4.22–5.81)
RDW: 14.6 % (ref 11.5–15.5)
WBC: 6.8 K/uL (ref 4.0–10.5)
nRBC: 0 % (ref 0.0–0.2)

## 2024-01-10 LAB — URINE DRUG SCREEN
Amphetamines: NEGATIVE
Barbiturates: NEGATIVE
Benzodiazepines: NEGATIVE
Cocaine: NEGATIVE
Fentanyl: NEGATIVE
Methadone Scn, Ur: NEGATIVE
Opiates: NEGATIVE
Tetrahydrocannabinol: NEGATIVE

## 2024-01-10 LAB — RESP PANEL BY RT-PCR (RSV, FLU A&B, COVID)  RVPGX2
Influenza A by PCR: NEGATIVE
Influenza B by PCR: NEGATIVE
Resp Syncytial Virus by PCR: NEGATIVE
SARS Coronavirus 2 by RT PCR: NEGATIVE

## 2024-01-10 LAB — TSH
TSH: 1.01 u[IU]/mL (ref 0.350–4.500)
TSH: 3.22 u[IU]/mL (ref 0.350–4.500)

## 2024-01-10 LAB — CBG MONITORING, ED: Glucose-Capillary: 86 mg/dL (ref 70–99)

## 2024-01-10 LAB — T4, FREE: Free T4: 0.75 ng/dL (ref 0.61–1.12)

## 2024-01-10 LAB — ETHANOL: Alcohol, Ethyl (B): 264 mg/dL — ABNORMAL HIGH (ref <15)

## 2024-01-10 LAB — PROTIME-INR
INR: 1.1 (ref 0.8–1.2)
Prothrombin Time: 14.4 s (ref 11.4–15.2)

## 2024-01-10 MED ORDER — HYDROMORPHONE HCL 1 MG/ML IJ SOLN: 0.5000 mg | INTRAMUSCULAR | Status: DC | PRN

## 2024-01-10 MED ORDER — HALOPERIDOL LACTATE 5 MG/ML IJ SOLN
2.0000 mg | Freq: Once | INTRAMUSCULAR | Status: AC
  Administered 2024-01-10: 2 mg via INTRAMUSCULAR
  Filled 2024-01-10: qty 1

## 2024-01-10 MED ORDER — IOHEXOL 300 MG/ML  SOLN
100.0000 mL | Freq: Once | INTRAMUSCULAR | Status: AC | PRN
  Administered 2024-01-10: 100 mL via INTRAVENOUS

## 2024-01-10 MED ORDER — SODIUM CHLORIDE 0.9 % IV BOLUS
1000.0000 mL | Freq: Once | INTRAVENOUS | Status: AC
  Administered 2024-01-10: 1000 mL via INTRAVENOUS

## 2024-01-10 MED ORDER — ONDANSETRON HCL 4 MG PO TABS: 4.0000 mg | ORAL_TABLET | Freq: Four times a day (QID) | ORAL | Status: DC | PRN

## 2024-01-10 MED ORDER — LEVALBUTEROL HCL 0.63 MG/3ML IN NEBU
0.6300 mg | INHALATION_SOLUTION | Freq: Four times a day (QID) | RESPIRATORY_TRACT | Status: DC
Start: 2024-01-10 — End: 2024-01-11
  Administered 2024-01-10 – 2024-01-11 (×2): 0.63 mg via RESPIRATORY_TRACT
  Filled 2024-01-10 (×3): qty 3

## 2024-01-10 MED ORDER — OXYCODONE HCL 5 MG PO TABS: 5.0000 mg | ORAL_TABLET | ORAL | Status: DC | PRN

## 2024-01-10 MED ORDER — MIRTAZAPINE 15 MG PO TABS: 7.5000 mg | ORAL_TABLET | Freq: Every evening | ORAL | Status: DC | PRN

## 2024-01-10 MED ORDER — CLONIDINE HCL 0.1 MG PO TABS
0.1000 mg | ORAL_TABLET | Freq: Two times a day (BID) | ORAL | Status: DC
  Administered 2024-01-10 – 2024-01-11 (×2): 0.1 mg via ORAL
  Filled 2024-01-10 (×2): qty 1

## 2024-01-10 MED ORDER — LORAZEPAM 2 MG/ML IJ SOLN: 1.0000 mg | INTRAMUSCULAR | Status: DC | PRN

## 2024-01-10 MED ORDER — ADULT MULTIVITAMIN W/MINERALS CH
1.0000 | ORAL_TABLET | Freq: Every day | ORAL | Status: DC
  Administered 2024-01-10 – 2024-01-11 (×2): 1 via ORAL
  Filled 2024-01-10 (×2): qty 1

## 2024-01-10 MED ORDER — METOPROLOL TARTRATE 5 MG/5ML IV SOLN
2.5000 mg | Freq: Once | INTRAVENOUS | Status: AC
  Administered 2024-01-10: 2.5 mg via INTRAVENOUS
  Filled 2024-01-10: qty 5

## 2024-01-10 MED ORDER — TAMSULOSIN HCL 0.4 MG PO CAPS
0.4000 mg | ORAL_CAPSULE | Freq: Every day | ORAL | Status: DC
  Administered 2024-01-11: 0.4 mg via ORAL
  Filled 2024-01-10: qty 1

## 2024-01-10 MED ORDER — LISINOPRIL 5 MG PO TABS
5.0000 mg | ORAL_TABLET | Freq: Every day | ORAL | Status: DC
  Administered 2024-01-10 – 2024-01-11 (×2): 5 mg via ORAL
  Filled 2024-01-10 (×2): qty 1

## 2024-01-10 MED ORDER — ROSUVASTATIN CALCIUM 20 MG PO TABS
20.0000 mg | ORAL_TABLET | ORAL | Status: DC
  Administered 2024-01-11: 20 mg via ORAL
  Filled 2024-01-10: qty 1

## 2024-01-10 MED ORDER — ACETAMINOPHEN 325 MG PO TABS: 650.0000 mg | ORAL_TABLET | Freq: Four times a day (QID) | ORAL | Status: DC | PRN

## 2024-01-10 MED ORDER — DROPERIDOL 2.5 MG/ML IJ SOLN
1.2500 mg | Freq: Once | INTRAMUSCULAR | Status: AC
  Administered 2024-01-10: 1.25 mg via INTRAVENOUS
  Filled 2024-01-10: qty 2

## 2024-01-10 MED ORDER — VENLAFAXINE HCL ER 75 MG PO CP24
75.0000 mg | ORAL_CAPSULE | Freq: Every day | ORAL | Status: DC
  Administered 2024-01-11: 75 mg via ORAL
  Filled 2024-01-10: qty 1

## 2024-01-10 MED ORDER — HYDRALAZINE HCL 20 MG/ML IJ SOLN: 10.0000 mg | Freq: Four times a day (QID) | INTRAMUSCULAR | Status: DC | PRN

## 2024-01-10 MED ORDER — ENOXAPARIN SODIUM 40 MG/0.4ML IJ SOSY
40.0000 mg | PREFILLED_SYRINGE | INTRAMUSCULAR | Status: DC
  Administered 2024-01-10: 40 mg via SUBCUTANEOUS
  Filled 2024-01-10: qty 0.4

## 2024-01-10 MED ORDER — SODIUM CHLORIDE 0.9 % IV BOLUS
500.0000 mL | Freq: Once | INTRAVENOUS | Status: AC
  Administered 2024-01-10: 500 mL via INTRAVENOUS

## 2024-01-10 MED ORDER — ALBUTEROL SULFATE (2.5 MG/3ML) 0.083% IN NEBU
2.5000 mg | INHALATION_SOLUTION | Freq: Four times a day (QID) | RESPIRATORY_TRACT | Status: DC | PRN
Start: 2024-01-10 — End: 2024-01-11
  Administered 2024-01-11: 2.5 mg via RESPIRATORY_TRACT

## 2024-01-10 MED ORDER — METOPROLOL TARTRATE 5 MG/5ML IV SOLN: 5.0000 mg | Freq: Three times a day (TID) | INTRAVENOUS | Status: DC | PRN

## 2024-01-10 MED ORDER — THIAMINE HCL 100 MG/ML IJ SOLN
100.0000 mg | Freq: Once | INTRAMUSCULAR | Status: AC
  Administered 2024-01-10: 100 mg via INTRAVENOUS
  Filled 2024-01-10: qty 2

## 2024-01-10 MED ORDER — FOLIC ACID 1 MG PO TABS
1.0000 mg | ORAL_TABLET | Freq: Every day | ORAL | Status: DC
  Administered 2024-01-10 – 2024-01-11 (×2): 1 mg via ORAL
  Filled 2024-01-10 (×2): qty 1

## 2024-01-10 MED ORDER — ONDANSETRON HCL 4 MG/2ML IJ SOLN: 4.0000 mg | Freq: Four times a day (QID) | INTRAMUSCULAR | Status: DC | PRN

## 2024-01-10 MED ORDER — LORAZEPAM 2 MG/ML IJ SOLN
2.0000 mg | Freq: Once | INTRAMUSCULAR | Status: AC
  Administered 2024-01-10: 2 mg via INTRAVENOUS
  Filled 2024-01-10: qty 1

## 2024-01-10 MED ORDER — LORAZEPAM 2 MG/ML IJ SOLN
0.0000 mg | INTRAMUSCULAR | Status: DC
  Administered 2024-01-10 (×3): 2 mg via INTRAVENOUS
  Filled 2024-01-10 (×3): qty 1

## 2024-01-10 MED ORDER — LORAZEPAM 2 MG/ML IJ SOLN: 0.0000 mg | Freq: Three times a day (TID) | INTRAMUSCULAR | Status: DC

## 2024-01-10 MED ORDER — IPRATROPIUM BROMIDE 0.02 % IN SOLN
0.5000 mg | Freq: Four times a day (QID) | RESPIRATORY_TRACT | Status: DC
  Administered 2024-01-10 – 2024-01-11 (×3): 0.5 mg via RESPIRATORY_TRACT
  Filled 2024-01-10 (×3): qty 2.5

## 2024-01-10 MED ORDER — HYDROXYZINE HCL 50 MG PO TABS
50.0000 mg | ORAL_TABLET | Freq: Every evening | ORAL | Status: DC | PRN
  Administered 2024-01-11: 50 mg via ORAL
  Filled 2024-01-10: qty 1

## 2024-01-10 MED ORDER — LORAZEPAM 1 MG PO TABS
1.0000 mg | ORAL_TABLET | ORAL | Status: DC | PRN
  Administered 2024-01-10 – 2024-01-11 (×4): 1 mg via ORAL
  Filled 2024-01-10: qty 2
  Filled 2024-01-10 (×4): qty 1

## 2024-01-10 MED ORDER — ACETAMINOPHEN 650 MG RE SUPP: 650.0000 mg | Freq: Four times a day (QID) | RECTAL | Status: DC | PRN

## 2024-01-10 MED ORDER — THIAMINE MONONITRATE 100 MG PO TABS
100.0000 mg | ORAL_TABLET | Freq: Every day | ORAL | Status: DC
  Administered 2024-01-10 – 2024-01-11 (×2): 100 mg via ORAL
  Filled 2024-01-10 (×2): qty 1

## 2024-01-10 MED ORDER — DEXTROSE-SODIUM CHLORIDE 5-0.45 % IV SOLN: INTRAVENOUS | Status: AC

## 2024-01-10 MED ORDER — THIAMINE HCL 100 MG/ML IJ SOLN
100.0000 mg | Freq: Every day | INTRAMUSCULAR | Status: DC
  Filled 2024-01-10: qty 2

## 2024-01-10 MED ORDER — METOPROLOL TARTRATE 50 MG PO TABS
50.0000 mg | ORAL_TABLET | Freq: Two times a day (BID) | ORAL | Status: DC
  Administered 2024-01-10 – 2024-01-11 (×2): 50 mg via ORAL
  Filled 2024-01-10 (×2): qty 1

## 2024-01-10 NOTE — ED Triage Notes (Signed)
 Patient called EMS due to alcohol  intoxication. Patient stated he drank 2 pints of kettle one vodka and some shooters since this morning Patient presents with nausea and dry heaving at this time.

## 2024-01-10 NOTE — ED Provider Notes (Signed)
 [place] EMERGENCY DEPARTMENT AT [facility] Provider Note   CSN: [id number] Arrival date & time: 01/10/24  0121     Patient presents with: Alcohol  Intoxication   Ruben [name] is a 73 y.o. male.   The history is provided by the patient, the EMS personnel and medical records.  Ruben [name] is a 73 y.o. male who presents to the Emergency Department complaining of alcohol  intoxication. He presents to the emergency department by EMS for complaints of a drank too much. EMS on their arrival report that he had two empty pints of kettle one vodka and reported that he had been doing multiple shooters throughout the day, started drinking at 6 AM. EMS reports that patient has been somnolent in route to the hospital. Patient complains of nausea and vomiting, unable to provide additional history.     Prior to Admission medications   Medication Sig Start Date End Date Taking? Authorizing Provider  lisinopril  (ZESTRIL ) 5 MG tablet Take 5 mg by mouth daily. 12/24/23  Yes [provider]  albuterol  (VENTOLIN  HFA) 108 (90 Base) MCG/ACT inhaler Inhale 2 puffs into the lungs every 6 (six) hours as needed. 05/24/23   [provider]  cloNIDine  (CATAPRES ) 0.1 MG tablet Take 1 tablet (0.1 mg total) by mouth 2 (two) times daily. Patient taking differently: Take 0.1 mg by mouth at bedtime. 02/04/23   Gonfa, Taye T, MD  desvenlafaxine (PRISTIQ) 50 MG 24 hr tablet Take 50 mg by mouth as needed. 03/05/23   [provider]  finasteride  (PROSCAR ) 5 MG tablet Take 5 mg by mouth in the morning.    [provider]  gabapentin  (NEURONTIN ) 300 MG capsule Take 300 mg by mouth at bedtime.    [provider]  hydrOXYzine  (ATARAX ) 50 MG tablet Take 50 mg by mouth at bedtime as needed for anxiety.    [provider]  lactulose  (CHRONULAC ) 10 GM/15ML solution Take 10 g by mouth every other day. 08/24/23   [provider]  magnesium  hydroxide (MILK OF  MAGNESIA) 400 MG/5ML suspension Take 30 mLs by mouth every other day.    [provider]  metoprolol  tartrate (LOPRESSOR ) 50 MG tablet Take 50 mg by mouth 2 (two) times daily.    [provider]  mirtazapine  (REMERON ) 7.5 MG tablet Take 7.5 mg by mouth at bedtime as needed (for sleep and anxiety). 08/22/23   [provider]  naltrexone (DEPADE) 50 MG tablet Take 50 mg by mouth 2 (two) times daily. 12/12/23   [provider]  QUEtiapine  (SEROQUEL ) 300 MG tablet Take 300 mg by mouth at bedtime. 12/02/22   [provider]  rosuvastatin  (CRESTOR ) 20 MG tablet Take 20 mg by mouth every Monday, Wednesday, and Friday. 08/23/21   [provider]  tamsulosin  (FLOMAX ) 0.4 MG CAPS capsule Take 0.4 mg by mouth in the morning.    [provider]  thiamine  (VITAMIN B-1) 100 MG tablet Take 1 tablet (100 mg total) by mouth daily. Patient taking differently: Take 100 mg by mouth in the morning. 04/13/23   Krishnan, Gokul, MD  traZODone  (DESYREL ) 50 MG tablet Take 50 mg by mouth at bedtime as needed for sleep. 10/25/23   [provider]    Allergies: Patient has no known allergies.    Review of Systems  All other systems reviewed and are negative.   Updated Vital Signs BP 134/81   Pulse (!) 143   Temp 98 F (36.7 C) (Oral)  Resp 17   SpO2 93%   Physical Exam Vitals and nursing note reviewed.  Constitutional:      Appearance: He is well-developed.     Comments: Drowsy, briefly awakens to verbal stimuli and then quickly falls back asleep  HENT:     Head: Normocephalic and atraumatic.  Cardiovascular:     Rate and Rhythm: Regular rhythm. Tachycardia present.     Heart sounds: No murmur heard. Pulmonary:     Effort: Pulmonary effort is normal. No respiratory distress.     Breath sounds: Normal breath sounds.  Abdominal:     Palpations: Abdomen is soft.     Tenderness: There is no abdominal tenderness. There is no guarding or rebound.      Comments: Ecchymosis over the right upper quadrant that appears to be healing  Musculoskeletal:        General: No swelling or tenderness.  Skin:    General: Skin is warm and dry.  Neurological:     Comments: Dysarthria speech. Moves all extremities symmetrically     (all labs ordered are listed, but only abnormal results are displayed) Labs Reviewed  COMPREHENSIVE METABOLIC PANEL WITH GFR - Abnormal; Notable for the following components:      Result Value   CO2 16 (*)    Anion gap 26 (*)    All other components within normal limits  ETHANOL - Abnormal; Notable for the following components:   Alcohol , Ethyl (B) 264 (*)    All other components within normal limits  CBC WITH DIFFERENTIAL/PLATELET - Abnormal; Notable for the following components:   HCT 52.4 (*)    All other components within normal limits  RESP PANEL BY RT-PCR (RSV, FLU A&B, COVID)  [id number]  PROTIME-INR  CBG MONITORING, ED    EKG: EKG Interpretation Date/Time:  Thursday January 10 2024 01:45:53 EDT Ventricular Rate:  112 PR Interval:  125 QRS Duration:  88 QT Interval:  347 QTC Calculation: 474 R Axis:   157  Text Interpretation: Sinus tachycardia Inferior infarct, age indeterminate Confirmed by Griselda Norris [phone]) on 01/10/2024 1:49:44 AM  Radiology: CT Cervical Spine Wo Contrast Result Date: 01/10/2024 EXAM: CT CERVICAL SPINE WITH CONTRAST 01/10/2024 04:40:40 AM TECHNIQUE: CT of the cervical spine was performed with the administration of 100 mL of iohexol  (OMNIPAQUE ) 300 MG/ML solution. Multiplanar reformatted images are provided for review. Automated exposure control, iterative reconstruction, and/or weight based adjustment of the mA/kV was utilized to reduce the radiation dose to as low as reasonably achievable. COMPARISON: CT of the cervical spine dated 07/07/2023. CLINICAL HISTORY: Neck trauma (Age >= 65y). alcohol  intoxication. Patient stated he drank 2 pints of kettle one vodka and some shooters  since this morning Patient presents with nausea and dry heaving at this time. Notes from chart; 100 ml omni 300. FINDINGS: CERVICAL SPINE: BONES AND ALIGNMENT: No acute fracture or traumatic malalignment. DEGENERATIVE CHANGES: Moderate chronic degenerative disc disease at C5-C6 and C6-C7. Degenerative disc bulging and endplate ridging at C5-C6 and C6-C7 causing moderate central spinal canal stenosis and moderate bilateral neural foraminal stenosis. Partial ankylosis of the facet joints at C2-C3. SOFT TISSUES: No prevertebral soft tissue swelling. IMPRESSION: 1. No acute abnormality of the cervical spine. 2. Moderate chronic degenerative disc disease at C5-6 and C6-7 with associated moderate central canal and bilateral neural foraminal stenosis. 3. Partial ankylosis of the C2-3 facet joints. Electronically signed by: Evalene Coho MD 01/10/2024 05:18 AM EDT RP Workstation: [id number]   CT CHEST ABDOMEN PELVIS W CONTRAST Result  Date: 01/10/2024 EXAM: CT CHEST, ABDOMEN AND PELVIS WITH CONTRAST 01/10/2024 04:40:40 AM TECHNIQUE: CT of the chest, abdomen and pelvis was performed with the administration of 100 mL of iohexol  (OMNIPAQUE ) 300 MG/ML solution. Multiplanar reformatted images are provided for review. Automated exposure control, iterative reconstruction, and/or weight based adjustment of the mA/kV was utilized to reduce the radiation dose to as low as reasonably achievable. COMPARISON: CT of the abdomen and pelvis dated 07/07/2023. CLINICAL HISTORY: Polytrauma, blunt. alcohol  intoxication. Patient stated he drank 2 pints of kettle one vodka and some shooters since this morning Patient presents with nausea and dry heaving at this time. Notes from chart; 100 ml omni 300. FINDINGS: CHEST: MEDIASTINUM AND LYMPH NODES: Heart and pericardium are unremarkable. The central airways are clear. No mediastinal, hilar or axillary lymphadenopathy. LUNGS AND PLEURA: Mild atelectasis present dependently within the lungs  bilaterally. There is a trace right-sided pleural effusion. No pneumothorax. No focal consolidation or pulmonary edema. ABDOMEN AND PELVIS: LIVER: There is diffuse fatty infiltration of the liver. An ovoid nodule is again demonstrated in the left upper quadrant measuring approximately 14 x 13 x 14 mm which appears unchanged from the prior study. It is seen on image 56 of series 4. GALLBLADDER AND BILE DUCTS: Gallbladder is unremarkable. No biliary ductal dilatation. SPLEEN: No acute abnormality. PANCREAS: No acute abnormality. ADRENAL GLANDS: No acute abnormality. KIDNEYS, URETERS AND BLADDER: No stones in the kidneys or ureters. No hydronephrosis. No perinephric or periureteral stranding. Urinary bladder is unremarkable. GI AND BOWEL: The patient is status post Nissen fundal ligation. Stomach demonstrates no acute abnormality. There is no bowel obstruction. REPRODUCTIVE ORGANS: No acute abnormality. PERITONEUM AND RETROPERITONEUM: No ascites. No free air. VASCULATURE: Aorta is normal in caliber. There is calcific atheromatous disease within the abdominal aorta and iliac arteries. ABDOMINAL AND PELVIS LYMPH NODES: No lymphadenopathy. BONES AND SOFT TISSUES: There is bilateral gynecomastia. No acute osseous abnormality. IMPRESSION: 1. No acute traumatic injury identified in the chest, abdomen, or pelvis. 2. Trace right pleural effusion with mild dependent atelectasis. 3. Diffuse hepatic steatosis. Electronically signed by: Evalene Coho MD 01/10/2024 05:15 AM EDT RP Workstation: [id number]   CT Head Wo Contrast Result Date: 01/10/2024 EXAM: CT HEAD WITHOUT CONTRAST 01/10/2024 04:40:40 AM TECHNIQUE: CT of the head was performed without the administration of intravenous contrast. Automated exposure control, iterative reconstruction, and/or weight based adjustment of the mA/kV was utilized to reduce the radiation dose to as low as reasonably achievable. COMPARISON: CT of the head dated 12/16/2023. CLINICAL  HISTORY: Head trauma, minor (Age >= 65y). alcohol  intoxication. Patient stated he drank 2 pints of kettle one vodka and some shooters since this morning Patient presents with nausea and dry heaving at this time. Notes from chart; 100 ml omni 300. FINDINGS: BRAIN AND VENTRICLES: No acute hemorrhage. No evidence of acute infarct. No hydrocephalus. No extra-axial collection. No mass effect or midline shift. ORBITS: No acute abnormality. SINUSES: Mucous retention cyst is again noted within the right frontal sinus. SOFT TISSUES AND SKULL: Debris is also again demonstrated within the external auditory canals bilaterally. No acute soft tissue abnormality. No skull fracture. IMPRESSION: 1. No acute intracranial abnormality. Electronically signed by: Evalene Coho MD 01/10/2024 05:09 AM EDT RP Workstation: [id number]     Procedures  CRITICAL CARE Performed by: Almarie Burner   Total critical care time: 35 minutes  Critical care time was exclusive of separately billable procedures and treating other patients.  Critical care was necessary to treat or prevent imminent or life-threatening  deterioration.  Critical care was time spent personally by me on the following activities: development of treatment plan with patient and/or surrogate as well as nursing, discussions with consultants, evaluation of patient's response to treatment, examination of patient, obtaining history from patient or surrogate, ordering and performing treatments and interventions, ordering and review of laboratory studies, ordering and review of radiographic studies, pulse oximetry and re-evaluation of patient's condition.  Medications Ordered in the ED  dextrose  5 % and 0.45 % NaCl infusion ( Intravenous New Bag/Given 01/10/24 0409)  metoprolol  tartrate (LOPRESSOR ) injection 2.5 mg (has no administration in time range)  thiamine  (VITAMIN B1) injection 100 mg (100 mg Intravenous Given 01/10/24 0156)  sodium chloride  0.9 % bolus 500  mL (0 mLs Intravenous Stopped 01/10/24 0245)  droperidol (INAPSINE) 2.5 MG/ML injection 1.25 mg (1.25 mg Intravenous Given 01/10/24 0245)  iohexol  (OMNIPAQUE ) 300 MG/ML solution 100 mL (100 mLs Intravenous Contrast Given 01/10/24 0411)  haloperidol  lactate (HALDOL ) injection 2 mg (2 mg Intramuscular Given 01/10/24 0525)  LORazepam  (ATIVAN ) injection 2 mg (2 mg Intravenous Given 01/10/24 9385)                                    Medical Decision Making Amount and/or Complexity of Data Reviewed Labs: ordered. Radiology: ordered.  Risk Prescription drug management.   Patient with history of alcohol  use disorder here for evaluation of alcohol  intoxication, nausea and malaise. Patient with frequent dry heaving in the emergency department, tachycardia. He does have evidence of recent trauma with ecchymosis over of abdominal wall. He was treated with antiemetics. CT imaging was obtained given concern for trauma, which is negative for acute traumatic injury. Patient with increasing tachycardia during his ED stay. He was treated with lorazepam  for possible alcohol  withdrawal despite his high alcohol  level. He had no significant change in his heart rate with this. He does appear to be slightly tremulous. He is also on home metoprolol , unclear when his last dose was, will provide an IV dose the emergency department. Labs are also concerning for alcohol  ketoacidosis, patient started on D5. Given persistent tachycardia, inability to tolerate PO medicine consulted for admission for ongoing care.     Final diagnoses:  Alcoholic intoxication with complication  Alcoholic ketoacidosis  Sinus tachycardia    ED Discharge Orders     None          Griselda Norris, MD 01/10/24 [phone]

## 2024-01-10 NOTE — H&P (Signed)
 History and Physical    Patient: Ruben [name] FMW:[id number] DOB: Jul 04, 1950 DOA: 01/10/2024 DOS: the patient was seen and examined on 01/10/2024 PCP: [name], FNP  Patient coming from: Home  Chief Complaint:  Chief Complaint  Patient presents with   Alcohol  Intoxication   HPI: Ruben [name] is a 73 y.o. male with medical history significant for Mannam to alcoholism, history of BPH, depression anxiety, history of aortic atherosclerosis, history of hypertension, hypercholesterolemia, prediabetes as well as other comorbidities who presents after he was intoxicated with alcohol .  He reportedly called EMS due to alcohol  intoxication and he states that he drank 2 pints of kettle one vodka with some shooters subsequently started having some nausea and dry heaving.  Patient states that he started drinking around 6 AM and because of his intoxication called EMS and he was brought to the hospital and was somnolent. He denies any chest pain or shortness of breath he denied any chest pain or shortness of breath. Patient was tremulous at the time of arrival and [facility] was asked to admit this patient for evaluation for alcohol  intoxication with concern for active withdrawal.  Review of Systems: As mentioned in the history of present illness. All other systems reviewed and are negative. Past Medical History:  Diagnosis Date   Alcoholism (HCC)    Anxiety    Aortic atherosclerosis 12/26/2016   Noted on CT   Arthritis    BPH (benign prostatic hyperplasia)    Chronic constipation    takes magnesium    Depression    Duodenal ulcer    Dyspnea 12/17/2017   ED (erectile dysfunction)    GERD (gastroesophageal reflux disease)    Heart palpitations    Occ   Hiatal hernia    History of adenomatous polyp of colon    tubular adenoma's 2005   History of Helicobacter pylori infection    2008   Hypercholesteremia    Hypertension    MR (mitral regurgitation)    Mild, noted on ECHO   Palpitations     Pre-diabetes    Pulmonary nodule 12/26/2016   noted on CT, 5 x 3 mm nodular opacity right upper lobe   Rosacea    Schatzki's ring    last dilated 02/ 2016   Substance abuse (HCC)    Urinary retention    history of   Varicocele 02/25/2014   Small to moderate left varicocele   Past Surgical History:  Procedure Laterality Date   BALLOON DILATION N/A 06/08/2014   Procedure: BALLOON DILATION;  Surgeon: Gladis MARLA Louder, MD;  Location: [facility];  Service: Endoscopy;  Laterality: N/A;   COLONOSCOPY WITH PROPOFOL  N/A 06/08/2014   Procedure: COLONOSCOPY WITH PROPOFOL ;  Surgeon: Gladis MARLA Louder, MD;  Location: [facility];  Service: Endoscopy;  Laterality: N/A;   ESOPHAGOGASTRODUODENOSCOPY N/A 06/08/2014   Procedure: ESOPHAGOGASTRODUODENOSCOPY (EGD);  Surgeon: Gladis MARLA Louder, MD;  Location: [facility];  Service: Endoscopy;  Laterality: N/A;   ESOPHAGOGASTRODUODENOSCOPY (EGD) WITH ESOPHAGEAL DILATION N/A 12/31/2012   Procedure: ESOPHAGOGASTRODUODENOSCOPY (EGD) WITH ESOPHAGEAL DILATION;  Surgeon: Gladis MARLA Louder, MD;  Location: [facility];  Service: Endoscopy;  Laterality: N/A;   GREEN LIGHT LASER TURP (TRANSURETHRAL RESECTION OF PROSTATE N/A 12/28/2015   Procedure: GREEN LIGHT LASER TURP (TRANSURETHRAL RESECTION OF PROSTATE;  Surgeon: Donnice Brooks, MD;  Location: [facility];  Service: Urology;  Laterality: N/A;   HERNIA REPAIR  04/2018   hiatal   LEFT HEART CATH AND CORONARY ANGIOGRAPHY N/A 05/16/2022   Procedure:  LEFT HEART CATH AND CORONARY ANGIOGRAPHY;  Surgeon: Elmira Newman PARAS, MD;  Location: [facility];  Service: Cardiovascular;  Laterality: N/A;   Social History:  reports that he has never smoked. He has never used smokeless tobacco. He reports current alcohol  use of about 154.0 standard drinks of alcohol  per week. He reports that he does not currently use drugs after having used the following drugs: Marijuana.  No Known Allergies  Family  History  Problem Relation Age of Onset   Heart failure Mother    Breast cancer Mother    Stroke Father    Other Father        Colon Issues   Lung disease Neg Hx     Prior to Admission medications   Medication Sig Start Date End Date Taking? Authorizing Provider  albuterol  (VENTOLIN  HFA) 108 (90 Base) MCG/ACT inhaler Inhale 2 puffs into the lungs every 6 (six) hours as needed for wheezing or shortness of breath. 05/24/23  Yes [provider]  cloNIDine  (CATAPRES ) 0.1 MG tablet Take 1 tablet (0.1 mg total) by mouth 2 (two) times daily. Patient taking differently: Take 0.1 mg by mouth in the morning and at bedtime. 02/04/23  Yes Gonfa, Taye T, MD  gabapentin  (NEURONTIN ) 300 MG capsule Take 300 mg by mouth at bedtime.   Yes [provider]  lisinopril  (ZESTRIL ) 5 MG tablet Take 5 mg by mouth daily. 12/24/23  Yes [provider]  QUEtiapine  (SEROQUEL ) 300 MG tablet Take 300 mg by mouth at bedtime. 12/02/22  Yes [provider]  desvenlafaxine (PRISTIQ) 50 MG 24 hr tablet Take 50 mg by mouth as needed. 03/05/23   [provider]  hydrOXYzine  (ATARAX ) 50 MG tablet Take 50 mg by mouth at bedtime as needed for anxiety.    [provider]  lactulose  (CHRONULAC ) 10 GM/15ML solution Take 10 g by mouth every other day. 08/24/23   [provider]  magnesium  hydroxide (MILK OF MAGNESIA) 400 MG/5ML suspension Take 30 mLs by mouth every other day.    [provider]  metoprolol  tartrate (LOPRESSOR ) 50 MG tablet Take 50 mg by mouth 2 (two) times daily.    [provider]  mirtazapine  (REMERON ) 7.5 MG tablet Take 7.5 mg by mouth at bedtime as needed (for sleep and anxiety). 08/22/23   [provider]  naltrexone (DEPADE) 50 MG tablet Take 50 mg by mouth 2 (two) times daily. 12/12/23   [provider]  rosuvastatin  (CRESTOR ) 20 MG tablet Take 20 mg by mouth every Monday, Wednesday, and Friday. 08/23/21   [provider]  tamsulosin  (FLOMAX ) 0.4 MG CAPS capsule Take 0.4 mg by mouth in the morning.    [provider]  thiamine  (VITAMIN B-1) 100 MG tablet Take 1 tablet (100 mg total) by mouth daily. Patient taking differently: Take 100 mg by mouth in the morning. 04/13/23   Krishnan, Gokul, MD  traZODone  (DESYREL ) 50 MG tablet Take 50 mg by mouth at bedtime as needed for sleep. 10/25/23   [provider]   Physical Exam: Vitals:   01/10/24 1630 01/10/24 1721 01/10/24 1725 01/10/24 1853  BP: 136/78 (!) 159/97  (!) 166/92  Pulse: (!) 102 (!) 121  (!) 140  Resp: 11 19  18   Temp:  97.9 F (36.6 C)  98 F (36.7 C)  TempSrc:  Oral  Oral  SpO2: 98% 96%  97%  Weight:   93.9 kg   Height:   5' 11 (1.803 m)  Examination: Physical Exam:  Constitutional: WN/WD overweight elderly Caucasian male who is tremulous and anxious Respiratory: Diminished to auscultation bilaterally, no wheezing, rales, rhonchi or crackles. Normal respiratory effort and patient is not tachypenic. No accessory muscle use.  Unlabored breathing Cardiovascular: Tachycardic rate, no murmurs / rubs / gallops. S1 and S2 auscultated.  No appreciable extremity edema Abdomen: Soft, non-tender, distended secondary to body habitus. Bowel sounds positive.  GU: Deferred. Musculoskeletal: No clubbing / cyanosis of digits/nails. No joint deformity upper and lower extremities.  Skin: No rashes, lesions, ulcers on limited skin evaluation. No induration; Warm and dry.  Neurologic: CN 2-12 grossly intact with no focal deficits. Romberg sign and cerebellar reflexes not assessed.  Psychiatric: Normal judgment and insight. Alert and oriented x 3.  Anxious appearing  Data Reviewed:  Recent Results (from the past 2160 hours)  cbc     Status: None   Collection Time: 12/15/23 12:25 PM  Result Value Ref Range   WBC 4.6 4.0 - 10.5 K/uL   RBC 5.55 4.22 - 5.81 MIL/uL   Hemoglobin 16.0 13.0 - 17.0 g/dL   HCT 50.6 60.9 - 47.9 %    MCV 88.8 80.0 - 100.0 fL   MCH 28.8 26.0 - 34.0 pg   MCHC 32.5 30.0 - 36.0 g/dL   RDW 84.6 88.4 - 84.4 %   Platelets 154 150 - 400 K/uL   nRBC 0.0 0.0 - 0.2 %    Comment: Performed at [facility], [address] [place], [place] [address]  CBG monitoring, ED     Status: None   Collection Time: 12/16/23  6:25 AM  Result Value Ref Range   Glucose-Capillary 93 70 - 99 mg/dL    Comment: Glucose reference range applies only to samples taken after fasting for at least 8 hours.  Urinalysis, Routine w reflex microscopic -Urine, Clean Catch     Status: Abnormal   Collection Time: 12/16/23  6:44 AM  Result Value Ref Range   Color, Urine YELLOW YELLOW   APPearance CLEAR CLEAR   Specific Gravity, Urine 1.010 1.005 - 1.030   pH 5.0 5.0 - 8.0   Glucose, UA NEGATIVE NEGATIVE mg/dL   Hgb urine dipstick SMALL (A) NEGATIVE   Bilirubin Urine NEGATIVE NEGATIVE   Ketones, ur 20 (A) NEGATIVE mg/dL   Protein, ur NEGATIVE NEGATIVE mg/dL   Nitrite NEGATIVE NEGATIVE   Leukocytes,Ua NEGATIVE NEGATIVE   RBC / HPF 0-5 0 - 5 RBC/hpf   WBC, UA 0-5 0 - 5 WBC/hpf   Bacteria, UA NONE SEEN NONE SEEN   Squamous Epithelial / HPF 0-5 0 - 5 /HPF   Mucus PRESENT    Hyaline Casts, UA PRESENT     Comment: Performed at [facility], [address] [place], [place] [address]  Rapid urine drug screen (hospital performed)     Status: None   Collection Time: 12/16/23  6:46 AM  Result Value Ref Range   Opiates NONE DETECTED NONE DETECTED   Cocaine NONE DETECTED NONE DETECTED   Benzodiazepines NONE DETECTED NONE DETECTED   Amphetamines NONE DETECTED NONE DETECTED   Tetrahydrocannabinol NONE DETECTED NONE DETECTED   Barbiturates NONE DETECTED NONE DETECTED    Comment: (NOTE) DRUG SCREEN FOR MEDICAL PURPOSES ONLY.  IF CONFIRMATION IS NEEDED FOR ANY PURPOSE, NOTIFY LAB WITHIN 5 DAYS.  LOWEST DETECTABLE LIMITS FOR URINE DRUG SCREEN Drug Class  Cutoff (ng/mL) Amphetamine  and metabolites    1000 Barbiturate and metabolites    200 Benzodiazepine                 200 Opiates and metabolites        300 Cocaine and metabolites        300 THC                            50 Performed at [facility], [address], [place], [place] [address]   Comprehensive metabolic panel     Status: Abnormal   Collection Time: 12/16/23  7:02 AM  Result Value Ref Range   Sodium 142 135 - 145 mmol/L   Potassium 4.4 3.5 - 5.1 mmol/L   Chloride 103 98 - 111 mmol/L   CO2 17 (L) 22 - 32 mmol/L   Glucose, Bld 87 70 - 99 mg/dL    Comment: Glucose reference range applies only to samples taken after fasting for at least 8 hours.   BUN 10 8 - 23 mg/dL   Creatinine, Ser 9.06 0.61 - 1.24 mg/dL   Calcium  8.7 (L) 8.9 - 10.3 mg/dL   Total Protein 7.1 6.5 - 8.1 g/dL   Albumin 3.8 3.5 - 5.0 g/dL   AST 36 15 - 41 U/L   ALT 31 0 - 44 U/L   Alkaline Phosphatase 66 38 - 126 U/L   Total Bilirubin 0.8 0.0 - 1.2 mg/dL   GFR, Estimated >39 >39 mL/min    Comment: (NOTE) Calculated using the CKD-EPI Creatinine Equation (2021)    Anion gap 22 (H) 5 - 15    Comment: ELECTROLYTES REPEATED TO VERIFY Performed at [facility], [address] [place], [place] [address]   CBC     Status: Abnormal   Collection Time: 12/16/23  7:02 AM  Result Value Ref Range   WBC 5.9 4.0 - 10.5 K/uL   RBC 5.27 4.22 - 5.81 MIL/uL   Hemoglobin 15.7 13.0 - 17.0 g/dL   HCT 52.9 60.9 - 47.9 %   MCV 89.2 80.0 - 100.0 fL   MCH 29.8 26.0 - 34.0 pg   MCHC 33.4 30.0 - 36.0 g/dL   RDW 84.5 88.4 - 84.4 %   Platelets 149 (L) 150 - 400 K/uL   nRBC 0.0 0.0 - 0.2 %    Comment: Performed at [facility], [address] [place], [place] [address]  Ethanol     Status: Abnormal   Collection Time: 12/16/23  7:02 AM  Result Value Ref Range   Alcohol , Ethyl (B) 292 (H) <15 mg/dL    Comment: (NOTE) For medical purposes only. Performed at [facility], [address], [place], [place] [address]    Vitamin B1     Status: Abnormal   Collection Time: 12/16/23  7:02 AM  Result Value Ref Range   Vitamin B1 (Thiamine ) 207.4 (H) 66.5 - 200.0 nmol/L    Comment: (NOTE) This test was developed and its performance characteristics determined by [facility]. It has not been cleared or approved by the Food and Drug Administration. Performed At: [facility] [address] [place], [place] [id number] Jennette Shorter MD Ey:[id number]   Troponin I (High Sensitivity)     Status: None   Collection Time: 12/16/23  7:02 AM  Result Value Ref Range   Troponin I (High Sensitivity) 15 <18 ng/L    Comment: (  NOTE) Elevated high sensitivity troponin I (hsTnI) values and significant  changes across serial measurements may suggest ACS but many other  chronic and acute conditions are known to elevate hsTnI results.  Refer to the Links section for chest pain algorithms and additional  guidance. Performed at [facility], [name], [place], [place] [address]   Basic metabolic panel     Status: Abnormal   Collection Time: 12/16/23  9:28 AM  Result Value Ref Range   Sodium 142 135 - 145 mmol/L   Potassium 4.5 3.5 - 5.1 mmol/L   Chloride 104 98 - 111 mmol/L   CO2 15 (L) 22 - 32 mmol/L   Glucose, Bld 107 (H) 70 - 99 mg/dL    Comment: Glucose reference range applies only to samples taken after fasting for at least 8 hours.   BUN 11 8 - 23 mg/dL   Creatinine, Ser 9.03 0.61 - 1.24 mg/dL   Calcium  8.3 (L) 8.9 - 10.3 mg/dL   GFR, Estimated >39 >39 mL/min    Comment: (NOTE) Calculated using the CKD-EPI Creatinine Equation (2021)    Anion gap 23 (H) 5 - 15    Comment: ELECTROLYTES REPEATED TO VERIFY Performed at [facility], [address] [place], [place] [address]   Comprehensive metabolic panel     Status: Abnormal   Collection Time: 12/16/23  8:58 PM  Result Value Ref Range   Sodium 142 135 - 145 mmol/L   Potassium 4.3 3.5 - 5.1 mmol/L    Comment: HEMOLYSIS AT THIS LEVEL  MAY AFFECT RESULT   Chloride 103 98 - 111 mmol/L   CO2 21 (L) 22 - 32 mmol/L   Glucose, Bld 151 (H) 70 - 99 mg/dL    Comment: Glucose reference range applies only to samples taken after fasting for at least 8 hours.   BUN 12 8 - 23 mg/dL   Creatinine, Ser 8.98 0.61 - 1.24 mg/dL   Calcium  9.0 8.9 - 10.3 mg/dL   Total Protein 7.1 6.5 - 8.1 g/dL   Albumin 4.3 3.5 - 5.0 g/dL   AST 36 15 - 41 U/L    Comment: HEMOLYSIS AT THIS LEVEL MAY AFFECT RESULT   ALT 34 0 - 44 U/L   Alkaline Phosphatase 81 38 - 126 U/L   Total Bilirubin 0.4 0.0 - 1.2 mg/dL   GFR, Estimated >39 >39 mL/min    Comment: (NOTE) Calculated using the CKD-EPI Creatinine Equation (2021)    Anion gap 18 (H) 5 - 15    Comment: Performed at [facility], [address], [place], [place] [address]  Ethanol     Status: Abnormal   Collection Time: 12/16/23  8:58 PM  Result Value Ref Range   Alcohol , Ethyl (B) 220 (H) <15 mg/dL    Comment: (NOTE) For medical purposes only. Performed at [facility], [address], [place], [place] [address]   CBC with Differential     Status: Abnormal   Collection Time: 12/16/23  8:58 PM  Result Value Ref Range   WBC 6.8 4.0 - 10.5 K/uL   RBC 5.36 4.22 - 5.81 MIL/uL   Hemoglobin 15.4 13.0 - 17.0 g/dL   HCT 52.2 60.9 - 47.9 %   MCV 89.0 80.0 - 100.0 fL   MCH 28.7 26.0 - 34.0 pg   MCHC 32.3 30.0 - 36.0 g/dL   RDW 84.2 (H) 88.4 - 84.4 %   Platelets 156 150 - 400  K/uL   nRBC 0.0 0.0 - 0.2 %   Neutrophils Relative % 75 %   Neutro Abs 5.1 1.7 - 7.7 K/uL   Lymphocytes Relative 13 %   Lymphs Abs 0.9 0.7 - 4.0 K/uL   Monocytes Relative 11 %   Monocytes Absolute 0.7 0.1 - 1.0 K/uL   Eosinophils Relative 0 %   Eosinophils Absolute 0.0 0.0 - 0.5 K/uL   Basophils Relative 1 %   Basophils Absolute 0.1 0.0 - 0.1 K/uL   Immature Granulocytes 0 %   Abs Immature Granulocytes 0.02 0.00 - 0.07 K/uL    Comment: Performed at [facility], [address] [place], [place] [address]  Comprehensive metabolic panel     Status: Abnormal   Collection Time: 01/10/24  1:48 AM  Result Value Ref Range   Sodium 143 135 - 145 mmol/L    Comment: Electrolytes repeated to verify    Potassium 4.2 3.5 - 5.1 mmol/L   Chloride 101 98 - 111 mmol/L   CO2 16 (L) 22 - 32 mmol/L   Glucose, Bld 80 70 - 99 mg/dL    Comment: Glucose reference range applies only to samples taken after fasting for at least 8 hours.   BUN 12 8 - 23 mg/dL   Creatinine, Ser 8.97 0.61 - 1.24 mg/dL   Calcium  9.0 8.9 - 10.3 mg/dL   Total Protein 7.9 6.5 - 8.1 g/dL   Albumin 4.7 3.5 - 5.0 g/dL   AST 38 15 - 41 U/L    Comment: HEMOLYSIS AT THIS LEVEL MAY AFFECT RESULT   ALT 30 0 - 44 U/L   Alkaline Phosphatase 93 38 - 126 U/L   Total Bilirubin 0.5 0.0 - 1.2 mg/dL   GFR, Estimated >39 >39 mL/min    Comment: (NOTE) Calculated using the CKD-EPI Creatinine Equation (2021)    Anion gap 26 (H) 5 - 15    Comment: Performed at [facility], [address], [place], [place] [address]  Ethanol     Status: Abnormal   Collection Time: 01/10/24  1:48 AM  Result Value Ref Range   Alcohol , Ethyl (B) 264 (H) <15 mg/dL    Comment: (NOTE) For medical purposes only. Performed at [facility], [address] [place], [place] [address]   CBC with Differential     Status: Abnormal   Collection Time: 01/10/24  1:48 AM  Result Value Ref Range   WBC 6.8 4.0 - 10.5 K/uL   RBC 5.75 4.22 - 5.81 MIL/uL   Hemoglobin 16.7 13.0 - 17.0 g/dL   HCT 47.5 (H) 60.9 - 47.9 %   MCV 91.1 80.0 - 100.0 fL   MCH 29.0 26.0 - 34.0 pg   MCHC 31.9 30.0 - 36.0 g/dL   RDW 85.3 88.4 - 84.4 %   Platelets 198 150 - 400 K/uL   nRBC 0.0 0.0 - 0.2 %   Neutrophils Relative % 59 %   Neutro Abs 4.0 1.7 - 7.7 K/uL   Lymphocytes Relative 31 %   Lymphs Abs 2.1 0.7 - 4.0 K/uL   Monocytes Relative 7 %   Monocytes Absolute 0.5 0.1 - 1.0 K/uL   Eosinophils Relative 1 %    Eosinophils Absolute 0.1 0.0 - 0.5 K/uL   Basophils Relative 2 %   Basophils Absolute 0.1 0.0 - 0.1 K/uL   Immature Granulocytes 0 %   Abs Immature Granulocytes 0.02 0.00 - 0.07 K/uL    Comment: Performed  at [facility], [address] [place], [place] [address]  Protime-INR     Status: None   Collection Time: 01/10/24  1:48 AM  Result Value Ref Range   Prothrombin Time 14.4 11.4 - 15.2 seconds   INR 1.1 0.8 - 1.2    Comment: (NOTE) INR goal varies based on device and disease states. Performed at [facility], [address], [place], [place] [address]   Resp panel by RT-PCR (RSV, Flu A&B, Covid) Anterior Nasal Swab     Status: None   Collection Time: 01/10/24  2:09 AM   Specimen: Anterior Nasal Swab  Result Value Ref Range   SARS Coronavirus 2 by RT PCR NEGATIVE NEGATIVE    Comment: (NOTE) SARS-CoV-2 target nucleic acids are NOT DETECTED.  The SARS-CoV-2 RNA is generally detectable in upper respiratory specimens during the acute phase of infection. The lowest concentration of SARS-CoV-2 viral copies this assay can detect is 138 copies/mL. A negative result does not preclude SARS-Cov-2 infection and should not be used as the sole basis for treatment or other patient management decisions. A negative result may occur with  improper specimen collection/handling, submission of specimen other than nasopharyngeal swab, presence of viral mutation(s) within the areas targeted by this assay, and inadequate number of viral copies(<138 copies/mL). A negative result must be combined with clinical observations, patient history, and epidemiological information. The expected result is Negative.  Fact Sheet for Patients:  BloggerCourse.com  Fact Sheet for Healthcare Providers:  SeriousBroker.it  This test is no t yet approved or cleared by the [place]  FDA and  has been authorized for detection  and/or diagnosis of SARS-CoV-2 by FDA under an Emergency Use Authorization (EUA). This EUA will remain  in effect (meaning this test can be used) for the duration of the COVID-19 declaration under Section 564(b)(1) of the Act, 21 U.S.C.section 360bbb-3(b)(1), unless the authorization is terminated  or revoked sooner.       Influenza A by PCR NEGATIVE NEGATIVE   Influenza B by PCR NEGATIVE NEGATIVE    Comment: (NOTE) The Xpert Xpress SARS-CoV-2/FLU/RSV plus assay is intended as an aid in the diagnosis of influenza from Nasopharyngeal swab specimens and should not be used as a sole basis for treatment. Nasal washings and aspirates are unacceptable for Xpert Xpress SARS-CoV-2/FLU/RSV testing.  Fact Sheet for Patients: BloggerCourse.com  Fact Sheet for Healthcare Providers: SeriousBroker.it  This test is not yet approved or cleared by the [place]  FDA and has been authorized for detection and/or diagnosis of SARS-CoV-2 by FDA under an Emergency Use Authorization (EUA). This EUA will remain in effect (meaning this test can be used) for the duration of the COVID-19 declaration under Section 564(b)(1) of the Act, 21 U.S.C. section 360bbb-3(b)(1), unless the authorization is terminated or revoked.     Resp Syncytial Virus by PCR NEGATIVE NEGATIVE    Comment: (NOTE) Fact Sheet for Patients: BloggerCourse.com  Fact Sheet for Healthcare Providers: SeriousBroker.it  This test is not yet approved or cleared by the [place]  FDA and has been authorized for detection and/or diagnosis of SARS-CoV-2 by FDA under an Emergency Use Authorization (EUA). This EUA will remain in effect (meaning this test can be used) for the duration of the COVID-19 declaration under Section 564(b)(1) of the Act, 21 U.S.C. section 360bbb-3(b)(1), unless the authorization is terminated  or revoked.  Performed at [facility], [address] [place], [place] [address]   CBG monitoring, ED  Status: None   Collection Time: 01/10/24  3:50 AM  Result Value Ref Range   Glucose-Capillary 86 70 - 99 mg/dL    Comment: Glucose reference range applies only to samples taken after fasting for at least 8 hours.  TSH     Status: None   Collection Time: 01/10/24  8:18 AM  Result Value Ref Range   TSH 1.010 0.350 - 4.500 uIU/mL    Comment: Performed at [facility], [address] [place], [place] [address]  T4, free     Status: None   Collection Time: 01/10/24  8:18 AM  Result Value Ref Range   Free T4 0.75 0.61 - 1.12 ng/dL    Comment: (NOTE) Biotin ingestion may interfere with free T4 tests. If the results are inconsistent with the TSH level, previous test results, or the clinical presentation, then consider biotin interference. If needed, order repeat testing after stopping biotin. Performed at [facility], [address], [place], [place] [address]   Urine Drug Screen     Status: None   Collection Time: 01/10/24  8:18 AM  Result Value Ref Range   Opiates NEGATIVE NEGATIVE   Cocaine NEGATIVE NEGATIVE   Benzodiazepines NEGATIVE NEGATIVE   Amphetamines NEGATIVE NEGATIVE   Tetrahydrocannabinol NEGATIVE NEGATIVE   Barbiturates NEGATIVE NEGATIVE   Methadone Scn, Ur NEGATIVE NEGATIVE   Fentanyl  NEGATIVE NEGATIVE    Comment: (NOTE) Drug screen is for Medical Purposes only. Positive results are preliminary only. If confirmation is needed, notify lab within 5 days.  Drug Class                 Cutoff (ng/mL) Amphetamine and metabolites 1000 Barbiturate and metabolites 200 Benzodiazepine              200 Opiates and metabolites     300 Cocaine and metabolites     300 THC                         50 Fentanyl                     5 Methadone                   300  Trazodone  is metabolized in vivo to several metabolites,   including pharmacologically active m-CPP, which is excreted in the  urine.  Immunoassay screens for amphetamines and MDMA have potential  cross-reactivity with these compounds and may provide false positive  result.  Performed at [facility], [address] [place], [place] [address]   TSH     Status: None   Collection Time: 01/10/24  7:42 PM  Result Value Ref Range   TSH 3.220 0.350 - 4.500 uIU/mL    Comment: Performed at [facility], [address], [place], [place] [address]   EKG: Showed a sinus tachycardia versus atrial tachycardia with a rate of 142 and a QTc of 471 with low voltage.  No evidence of ST elevation MI interpretation  Assessment and Plan: No notes have been filed under this hospital service. Service: Hospitalist  Alcohol  intoxication with concern for withdrawal: Admitted to the progressive care unit and started on IV fluid hydration with D5 half-normal saline at 100 mL/h for 1 day.  Given droperidol and ED for nausea and haloperidol .  Continue with CIWA protocol scheduled and as needed and he only received 2 mg IV Ativan  in the ED.  Initiate multivitamin with minerals, folic acid  1 mg p.o. daily and vitamin B12.  Given a 1 L bolus.  Watch for any delirium or seizures.  Continue with antiemetics as ondansetron .  Alcohol  ethyl level was 264 on admission  BPH: Continue Tamsulosin   Tachycardia: In setting of alcohol  intoxication.  EKG as above.  Check TSH and was 1.010 with repeat being 3.220 with free T4 being 0.75 continue with IV fluid hydration.  Resume his home metoprolol  to tartrate 50 mg p.o. twice daily and placed on IV metoprolol  to tartrate 5 mg every 8 as needed for heart rate that sustained greater than 120.  Continue with IV fluid hydration as above.  Continue monitoring telemetry and if continues to be tachycardic and may be having atrial tachycardia will discuss with cardiology  Essential hypertension: Continue with  his home medications including lisinopril  5 mg p.o. daily, clonidine  0.1 mg p.o. twice daily and metoprolol  tartrate 50 mg p.o. twice daily.  Continue to monitor blood pressure per protocol and patient was placed on IV hydralazine  10 mg every 6 as needed for high blood pressure.  Last blood pressure reading was 166/92  Elevated anion gap metabolic acidosis: Has a significantly elevated anion gap metabolic acidosis with a CO2 of 16, anion gap of 26 and chloride level 101  Hyperlipidemia: Continue statin  Depression and anxiety: Continue with hydroxyzine  50 mg p.o. nightly as needed for anxiety, mirtazapine  7.5 mg p.o. nightly as needed for sleep and anxiety, and venlafaxine  XR 75 mg p.o. daily  Overweight: Complicates overall prognosis and care. Estimated body mass index is 28.87 kg/m as calculated from the following:   Height as of this encounter: 5' 11 (1.803 m).   Weight as of this encounter: 93.9 kg. Weight Loss and Dietary Counseling given  Advance Care Planning:   Code Status: Full Code  Consults: None  Family Communication: No family present @ bedside  Severity of Illness: The appropriate patient status for this patient is INPATIENT. Inpatient status is judged to be reasonable and necessary in order to provide the required intensity of service to ensure the patient's safety. The patient's presenting symptoms, physical exam findings, and initial radiographic and laboratory data in the context of their chronic comorbidities is felt to place them at high risk for further clinical deterioration. Furthermore, it is not anticipated that the patient will be medically stable for discharge from the hospital within 2 midnights of admission.   * I certify that at the point of admission it is my clinical judgment that the patient will require inpatient hospital care spanning beyond 2 midnights from the point of admission due to high intensity of service, high risk for further deterioration and high  frequency of surveillance required.*  Author: Alejandro Lazarus Marker, DO 01/10/2024 7:00 PM  For on call review [email or web address].

## 2024-01-10 NOTE — ED Notes (Signed)
 Patient [employer] card found on the floor in patient room. Nurse acknowledged card was on the floor and informed patient that it is now on the bedside table as witnessed by [place], Charity fundraiser.

## 2024-01-10 NOTE — Plan of Care (Signed)
  Problem: Clinical Measurements: Goal: Respiratory complications will improve Outcome: Progressing   Problem: Clinical Measurements: Goal: Cardiovascular complication will be avoided Outcome: Progressing   Problem: Activity: Goal: Risk for activity intolerance will decrease Outcome: Progressing   Problem: Nutrition: Goal: Adequate nutrition will be maintained Outcome: Progressing   

## 2024-01-11 ENCOUNTER — Other Ambulatory Visit (HOSPITAL_COMMUNITY): Payer: Self-pay

## 2024-01-11 DIAGNOSIS — F10929 Alcohol use, unspecified with intoxication, unspecified: Secondary | ICD-10-CM | POA: Diagnosis not present

## 2024-01-11 DIAGNOSIS — F10930 Alcohol use, unspecified with withdrawal, uncomplicated: Secondary | ICD-10-CM | POA: Diagnosis not present

## 2024-01-11 LAB — CBC WITH DIFFERENTIAL/PLATELET
Abs Immature Granulocytes: 0.01 K/uL (ref 0.00–0.07)
Basophils Absolute: 0 K/uL (ref 0.0–0.1)
Basophils Relative: 1 %
Eosinophils Absolute: 0.1 K/uL (ref 0.0–0.5)
Eosinophils Relative: 1 %
HCT: 41.9 % (ref 39.0–52.0)
Hemoglobin: 13.6 g/dL (ref 13.0–17.0)
Immature Granulocytes: 0 %
Lymphocytes Relative: 18 %
Lymphs Abs: 0.9 K/uL (ref 0.7–4.0)
MCH: 29.1 pg (ref 26.0–34.0)
MCHC: 32.5 g/dL (ref 30.0–36.0)
MCV: 89.7 fL (ref 80.0–100.0)
Monocytes Absolute: 0.7 K/uL (ref 0.1–1.0)
Monocytes Relative: 16 %
Neutro Abs: 2.9 K/uL (ref 1.7–7.7)
Neutrophils Relative %: 64 %
Platelets: 110 K/uL — ABNORMAL LOW (ref 150–400)
RBC: 4.67 MIL/uL (ref 4.22–5.81)
RDW: 14.6 % (ref 11.5–15.5)
WBC: 4.6 K/uL (ref 4.0–10.5)
nRBC: 0 % (ref 0.0–0.2)

## 2024-01-11 LAB — COMPREHENSIVE METABOLIC PANEL WITH GFR
ALT: 21 U/L (ref 0–44)
AST: 23 U/L (ref 15–41)
Albumin: 3.7 g/dL (ref 3.5–5.0)
Alkaline Phosphatase: 68 U/L (ref 38–126)
Anion gap: 11 (ref 5–15)
BUN: 9 mg/dL (ref 8–23)
CO2: 21 mmol/L — ABNORMAL LOW (ref 22–32)
Calcium: 8.5 mg/dL — ABNORMAL LOW (ref 8.9–10.3)
Chloride: 105 mmol/L (ref 98–111)
Creatinine, Ser: 0.86 mg/dL (ref 0.61–1.24)
GFR, Estimated: >60 mL/min (ref >60)
Glucose, Bld: 121 mg/dL — ABNORMAL HIGH (ref 70–99)
Potassium: 3.6 mmol/L (ref 3.5–5.1)
Sodium: 136 mmol/L (ref 135–145)
Total Bilirubin: 0.6 mg/dL (ref 0.0–1.2)
Total Protein: 6 g/dL — ABNORMAL LOW (ref 6.5–8.1)

## 2024-01-11 LAB — MAGNESIUM: Magnesium: 2 mg/dL (ref 1.7–2.4)

## 2024-01-11 LAB — PHOSPHORUS: Phosphorus: 1.2 mg/dL — ABNORMAL LOW (ref 2.5–4.6)

## 2024-01-11 MED ORDER — ACETAMINOPHEN 325 MG PO TABS
650.0000 mg | ORAL_TABLET | Freq: Four times a day (QID) | ORAL | 0 refills | Status: AC | PRN
  Filled 2024-01-11: qty 20, 3d supply, fill #0

## 2024-01-11 MED ORDER — SODIUM CHLORIDE 0.9 % IV BOLUS
1000.0000 mL | Freq: Once | INTRAVENOUS | Status: AC
  Administered 2024-01-11: 1000 mL via INTRAVENOUS

## 2024-01-11 MED ORDER — SODIUM BICARBONATE 650 MG PO TABS
650.0000 mg | ORAL_TABLET | Freq: Two times a day (BID) | ORAL | Status: DC
  Administered 2024-01-11: 650 mg via ORAL
  Filled 2024-01-11: qty 1

## 2024-01-11 MED ORDER — SODIUM BICARBONATE 650 MG PO TABS
650.0000 mg | ORAL_TABLET | Freq: Two times a day (BID) | ORAL | 0 refills | Status: AC
  Filled 2024-01-11: qty 10, 5d supply, fill #0

## 2024-01-11 MED ORDER — POTASSIUM PHOSPHATES 15 MMOLE/5ML IV SOLN
30.0000 mmol | Freq: Once | INTRAVENOUS | Status: AC
  Administered 2024-01-11: 30 mmol via INTRAVENOUS
  Filled 2024-01-11: qty 10

## 2024-01-11 MED ORDER — VITAMIN B-1 100 MG PO TABS
100.0000 mg | ORAL_TABLET | Freq: Every day | ORAL | 0 refills | Status: DC
  Filled 2024-01-11: qty 30, 30d supply, fill #0

## 2024-01-11 MED ORDER — FOLIC ACID 1 MG PO TABS
1.0000 mg | ORAL_TABLET | Freq: Every day | ORAL | 0 refills | Status: DC
  Filled 2024-01-11: qty 30, 30d supply, fill #0

## 2024-01-11 MED ORDER — ONDANSETRON HCL 4 MG PO TABS
4.0000 mg | ORAL_TABLET | Freq: Four times a day (QID) | ORAL | 0 refills | Status: AC | PRN
  Filled 2024-01-11: qty 20, 5d supply, fill #0

## 2024-01-11 MED ORDER — ADULT MULTIVITAMIN W/MINERALS CH
1.0000 | ORAL_TABLET | Freq: Every day | ORAL | 0 refills | Status: AC
  Filled 2024-01-11: qty 30, 30d supply, fill #0

## 2024-01-11 NOTE — TOC Initial Note (Signed)
 Transition of Care [facility]) - Initial/Assessment Note   Patient Details  Name: Ruben [name] MRN: [id number] Date of Birth: 1950-12-14  [facility] CM/SW Contact:    Duwaine GORMAN Aran, LCSW Phone Number: 01/11/2024, 11:56 AM  Clinical Narrative: Care management consulted for ETOH use resources. Patient is agreeable to OP resources being added to AVS. CSW added resources to AVS.  Expected Discharge Plan: Home/Self Care Barriers to Discharge: Continued Medical Work up  Patient Goals and CMS Choice Patient states their goals for this hospitalization and ongoing recovery are:: Home Choice offered to / list presented to : NA  Expected Discharge Plan and Services Post Acute Care Choice: NA Living arrangements for the past 2 months: Single Family Home          DME Arranged: N/A DME Agency: NA  Prior Living Arrangements/Services Living arrangements for the past 2 months: Single Family Home Lives with:: Self Patient language and need for interpreter reviewed:: Yes Do you feel safe going back to the place where you live?: Yes      Need for Family Participation in Patient Care: No (Comment) Care giver support system in place?: Yes (comment) Criminal Activity/Legal Involvement Pertinent to Current Situation/Hospitalization: No - Comment as needed  Activities of Daily Living ADL Screening (condition at time of admission) Independently performs ADLs?: Yes (appropriate for developmental age) Is the patient deaf or have difficulty hearing?: No Does the patient have difficulty seeing, even when wearing glasses/contacts?: No Does the patient have difficulty concentrating, remembering, or making decisions?: No  Emotional Assessment Appearance:: Appears younger than stated age Attitude/Demeanor/Rapport: Engaged Affect (typically observed): Accepting Orientation: : Oriented to Self, Oriented to Place, Oriented to  Time, Oriented to Situation Alcohol  / Substance Use: Alcohol  Use Psych  Involvement: No (comment)  Admission diagnosis:  Alcohol  withdrawal (HCC) [F10.939] Sinus tachycardia [R00.0] Alcoholic ketoacidosis [E87.29] Alcoholic intoxication with complication [F10.929] Patient Active Problem List   Diagnosis Date Noted   UTI (urinary tract infection) 07/08/2023   Alcohol  dependence with withdrawal, unspecified (HCC) 04/11/2023   Depression    Alcoholic ketoacidosis 03/22/2023   Alcohol  withdrawal hallucinosis (HCC) 03/22/2023   Severe alcohol  withdrawal delirium (HCC) 02/27/2023   Lactic acid acidosis 02/26/2023   Encephalopathy acute 12/17/2022   Lactic acidosis 12/17/2022   Dehydration 12/17/2022   Acute lactic acidosis 12/17/2022   Abnormal stress test 05/11/2022   Elevated coronary artery calcium  score 05/11/2022   Aortic atherosclerosis 04/13/2022   Elevated LFTs    Acute kidney injury 09/08/2020   Hyponatremia 09/08/2020   Hyperbilirubinemia 09/08/2020   Sinus tachycardia 08/16/2020   Palpitations 08/16/2020   Exertional dyspnea 08/16/2020   Mild renal insufficiency 06/23/2020   Actinic keratosis 06/04/2020   Prediabetes 06/04/2020   MDD (major depressive disorder), recurrent episode, moderate (HCC) 06/01/2020   Weight loss 04/08/2020   Unsteady gait 04/08/2020   Insomnia 04/08/2020   Lump of skin of left upper extremity 04/08/2020   Irregular heart beat 04/08/2020   Pure hypercholesterolemia 04/08/2020   Alcohol  use disorder, severe, dependence (HCC) 04/03/2020   Hyperlipidemia 04/03/2020   Suicidal ideation 04/03/2020   Metabolic acidosis 12/22/2019   Thrombocytopenia 12/22/2019   ARF (acute renal failure) 10/01/2019   Alcohol  withdrawal (HCC) 10/01/2019   Normochromic normocytic anemia 10/01/2019   GERD (gastroesophageal reflux disease) 06/22/2019   BPH (benign prostatic hyperplasia) 06/22/2019   Essential hypertension 06/22/2019   AKI (acute kidney injury)    Alcohol  intoxication 06/21/2019   Shoulder joint pain 03/31/2019   S/P  Nissen fundoplication (without gastrostomy tube) procedure 04/26/2018   Rosacea 06/24/2011   PCP:  [name], FNP Pharmacy:   [facility] [address] [place] - [address] [place] [address] Phone: [phone] Fax: [phone]  [place] [facility] [address] [place] [address] Phone: [phone] Fax: [phone]  Social Drivers of Health (SDOH) Social History: SDOH Screenings   Food Insecurity: No Food Insecurity (01/10/2024)  Housing: Low Risk  (01/10/2024)  Transportation Needs: No Transportation Needs (01/10/2024)  Utilities: Not At Risk (01/10/2024)  Social Connections: Socially Isolated (01/10/2024)  Tobacco Use: Low Risk  (01/10/2024)   SDOH Interventions:    Readmission Risk Interventions    01/11/2024   11:54 AM 07/09/2023    3:45 PM 03/23/2023   10:01 AM  Readmission Risk Prevention Plan  Transportation Screening Complete Complete Complete  PCP or Specialist Appt within 3-5 Days   Complete  HRI or Home Care Consult   Complete  Social Work Consult for Recovery Care Planning/Counseling   Complete  Palliative Care Screening   Not Applicable  Medication Review Oceanographer) Complete Complete Complete  PCP or Specialist appointment within 3-5 days of discharge  Complete   HRI or Home Care Consult Complete Complete   SW Recovery Care/Counseling Consult Complete Complete   Palliative Care Screening Not Applicable Not Applicable   Skilled Nursing Facility Not Applicable Not Applicable

## 2024-01-11 NOTE — Progress Notes (Signed)
 AVS reviewed with patient, no questions. Awaiting infusion completion then will [name] home.

## 2024-01-11 NOTE — Progress Notes (Signed)
 Discharge medications delivered to patient at bedside in a secure bag.

## 2024-01-11 NOTE — Plan of Care (Signed)
   Problem: Clinical Measurements: Goal: Will remain free from infection Outcome: Progressing   Problem: Clinical Measurements: Goal: Diagnostic test results will improve Outcome: Progressing   Problem: Clinical Measurements: Goal: Respiratory complications will improve Outcome: Progressing   Problem: Clinical Measurements: Goal: Cardiovascular complication will be avoided Outcome: Progressing

## 2024-01-11 NOTE — Progress Notes (Signed)
 Patient has locked his Iphone. Requested to use RN's phone to contact his POA. Alternatively offered to contact POA on hospital phone and request that she call patient on room phone. Request completed.

## 2024-01-11 NOTE — Progress Notes (Signed)
 [employer] called per patient request. Patient being transported via wheelchair to main entrance. Clothing, walllet, cell phone, and home meds forwarded with patient.

## 2024-01-11 NOTE — Care Management Obs Status (Signed)
 MEDICARE OBSERVATION STATUS NOTIFICATION   Patient Details  Name: Ruben [name] MRN: [id number] Date of Birth: 08-30-1950   Medicare Observation Status Notification Given:  Yes    Duwaine GORMAN Aran, LCSW 01/11/2024, 2:38 PM

## 2024-01-12 DIAGNOSIS — F10939 Alcohol use, unspecified with withdrawal, unspecified: Secondary | ICD-10-CM | POA: Diagnosis not present

## 2024-01-13 NOTE — Discharge Summary (Signed)
 Physician Discharge Summary   Patient: Ruben [name] MRN: [id number] DOB: 1950/09/18  Admit date:     01/10/2024  Discharge date: 01/11/2024  Discharge Physician: Alejandro Marker, DO   PCP: [name], FNP   Recommendations at discharge:   Follow-up with PCP within 1 to 2 weeks repeat CBC, CMP, mag, Phos within 1 week  Discharge Diagnoses: Principal Problem:   Alcohol  withdrawal (HCC)  Resolved Problems:   * No resolved hospital problems. *  Hospital Course: Ruben [name] is a 73 y.o. male with medical history significant for Mannam to alcoholism, history of BPH, depression anxiety, history of aortic atherosclerosis, history of hypertension, hypercholesterolemia, prediabetes as well as other comorbidities who presents after he was intoxicated with alcohol .  He reportedly called EMS due to alcohol  intoxication and he states that he drank 2 pints of kettle one vodka with some shooters subsequently started having some nausea and dry heaving.  Patient states that he started drinking around 6 AM and because of his intoxication called EMS and he was brought to the hospital and was somnolent. He denies any chest pain or shortness of breath he denied any chest pain or shortness of breath. Patient was tremulous at the time of arrival and [facility] was asked to admit this patient for evaluation for alcohol  intoxication with concern for active withdrawal.   **Interim History: Patient improved from his alcohol  intoxication and was not really withdrawing from alcohol  anymore and his tachycardia improved.  He was deemed medically stable for discharge and was advised to monitor how much he drinks and resources were given to him by the social work team.  His heart rate is improved and his beta-blocker was resumed..  Assessment and Plan:  Alcohol  intoxication with concern for withdrawal: Admitted to the progressive care unit and started on IV fluid hydration with D5 half-normal saline at 100 mL/h for 1 day.   Given droperidol and ED for nausea and haloperidol .  Continue with CIWA protocol scheduled and as needed and he only received 2 mg IV Ativan  in the ED.  Initiate multivitamin with minerals, folic acid  1 mg p.o. daily and vitamin B12.  Given a 1 L bolus.  Watch for any delirium or seizures.  Continue with antiemetics as ondansetron .  Alcohol  ethyl level was 264 on admission   BPH: Continue Tamsulosin    Tachycardia: In setting of alcohol  intoxication.  EKG as above.  Check TSH and was 1.010 with repeat being 3.220 with free T4 being 0.75 continue with IV fluid hydration.  Resume his home metoprolol  to tartrate 50 mg p.o. twice daily and placed on IV metoprolol  to tartrate 5 mg every 8 as needed for heart rate that sustained greater than 120.  Continue with IV fluid hydration as above.  Continue monitoring telemetry and if continues to be tachycardic and may be having atrial tachycardia will discuss with cardiology.  Heart rate rhythm was improved prior to discharge.   Essential hypertension: Continue with his home medications including lisinopril  5 mg p.o. daily, clonidine  0.1 mg p.o. twice daily and metoprolol  tartrate 50 mg p.o. twice daily.  Continue to monitor blood pressure per protocol and patient was placed on IV hydralazine  10 mg every 6 as needed for high blood pressure.  Last blood pressure reading was 166/92   Elevated anion gap metabolic acidosis: Has a significantly elevated anion gap metabolic acidosis with a CO2 of 16, anion gap of 26 and chloride level 101 on admission and his anion gap metabolic acidosis  was improving and now have a CO2 of 21, anion gap of 11, chloride level of 105  Thrombocytopenia: Plt Count went from 198 -> 110.  Continue to monitor for signs and symptoms bleeding; no overt bleeding noted.  Will need repeat CBC within 1 week  Hypophosphatemia: Phos level was extremely low at 1.2.  Replete with IV K-Phos 30 mmol prior to discharge.  Repeat Phos level within 1 week.    Hyperlipidemia: Continue statin   Depression and anxiety: Continue with hydroxyzine  50 mg p.o. nightly as needed for anxiety, mirtazapine  7.5 mg p.o. nightly as needed for sleep and anxiety, and venlafaxine  XR 75 mg p.o. daily   Overweight: Complicates overall prognosis and care. Estimated body mass index is 28.87 kg/m as calculated from the following:   Height as of this encounter: 5' 11 (1.803 m).   Weight as of this encounter: 93.9 kg. Weight Loss and Dietary Counseling given  Consultants: None  Procedures performed: As delineated as above   Disposition: Home Diet recommendation:  Discharge Diet Orders (From admission, onward)     Start     Ordered   01/11/24 0000  Diet - low sodium heart healthy        01/11/24 1351           Cardiac diet DISCHARGE MEDICATION: Allergies as of 01/11/2024   No Known Allergies      Medication List     TAKE these medications    acetaminophen  325 MG tablet Commonly known as: TYLENOL  Take 2 tablets (650 mg total) by mouth every 6 (six) hours as needed for mild pain (pain score 1-3) or fever (or Fever >/= 101).   albuterol  108 (90 Base) MCG/ACT inhaler Commonly known as: VENTOLIN  HFA Inhale 2 puffs into the lungs every 6 (six) hours as needed for wheezing or shortness of breath.   CertaVite/Antioxidants Tabs Take 1 tablet by mouth daily.   cloNIDine  0.1 MG tablet Commonly known as: CATAPRES  Take 1 tablet (0.1 mg total) by mouth 2 (two) times daily.   desvenlafaxine 50 MG 24 hr tablet Commonly known as: PRISTIQ Take 50 mg by mouth as needed.   finasteride  5 MG tablet Commonly known as: PROSCAR  Take 5 mg by mouth daily.   folic acid  1 MG tablet Commonly known as: FOLVITE  Take 1 tablet (1 mg total) by mouth daily.   gabapentin  300 MG capsule Commonly known as: NEURONTIN  Take 300 mg by mouth at bedtime.   hydrOXYzine  50 MG tablet Commonly known as: ATARAX  Take 50 mg by mouth at bedtime as needed for anxiety.    lactulose  10 GM/15ML solution Commonly known as: CHRONULAC  Take 10 g by mouth every other day.   lisinopril  5 MG tablet Commonly known as: ZESTRIL  Take 5 mg by mouth daily.   metoprolol  tartrate 50 MG tablet Commonly known as: LOPRESSOR  Take 50 mg by mouth 2 (two) times daily.   mirtazapine  7.5 MG tablet Commonly known as: REMERON  Take 7.5 mg by mouth at bedtime as needed (for sleep and anxiety).   naltrexone 50 MG tablet Commonly known as: DEPADE Take 50 mg by mouth 2 (two) times daily.   ondansetron  4 MG tablet Commonly known as: ZOFRAN  Take 1 tablet (4 mg total) by mouth every 6 (six) hours as needed for nausea.   QUEtiapine  300 MG tablet Commonly known as: SEROQUEL  Take 300 mg by mouth at bedtime.   rosuvastatin  20 MG tablet Commonly known as: CRESTOR  Take 20 mg by mouth every Monday, Wednesday,  and Friday.   sodium bicarbonate  650 MG tablet Take 1 tablet (650 mg total) by mouth 2 (two) times daily for 5 days.   tamsulosin  0.4 MG Caps capsule Commonly known as: FLOMAX  Take 0.4 mg by mouth in the morning.   thiamine  100 MG tablet Commonly known as: VITAMIN B1 Take 1 tablet (100 mg total) by mouth daily.   traZODone  50 MG tablet Commonly known as: DESYREL  Take 50 mg by mouth at bedtime as needed for sleep.       Discharge Exam: Filed Weights   01/10/24 1725  Weight: 93.9 kg   Vitals:   01/11/24 1159 01/11/24 1346  BP: 134/84 (!) 144/83  Pulse: 66 63  Resp: 20 20  Temp: 98.1 F (36.7 C) 98.2 F (36.8 C)  SpO2: 98% 98%   Examination: Physical Exam:  Constitutional: WN/WD overweight chronically ill-appearing Caucasian elderly male in no acute distress Respiratory: Diminished to auscultation bilaterally, no wheezing, rales, rhonchi or crackles. Normal respiratory effort and patient is not tachypenic. No accessory muscle use.  Unlabored breathing Cardiovascular: RRR, no murmurs / rubs / gallops. S1 and S2 auscultated.  Minimal extremity  edema Abdomen: Soft, non-tender, distended secondary body habitus. Bowel sounds positive.  GU: Deferred. Musculoskeletal: No clubbing / cyanosis of digits/nails. No joint deformity upper and lower extremities.  Skin: No rashes, lesions, ulcers limited skin evaluation. No induration; Warm and dry.  Neurologic: CN 2-12 grossly intact with no focal deficits and has very mild tremors. Romberg sign and cerebellar reflexes not assessed.  Psychiatric: Normal judgment and insight. Alert and oriented x 3. Normal mood and appropriate affect.   Condition at discharge: stable  The results of significant diagnostics from this hospitalization (including imaging, microbiology, ancillary and laboratory) are listed below for reference.   Imaging Studies: CT Cervical Spine Wo Contrast Result Date: 01/10/2024 EXAM: CT CERVICAL SPINE WITH CONTRAST 01/10/2024 04:40:40 AM TECHNIQUE: CT of the cervical spine was performed with the administration of 100 mL of iohexol  (OMNIPAQUE ) 300 MG/ML solution. Multiplanar reformatted images are provided for review. Automated exposure control, iterative reconstruction, and/or weight based adjustment of the mA/kV was utilized to reduce the radiation dose to as low as reasonably achievable. COMPARISON: CT of the cervical spine dated 07/07/2023. CLINICAL HISTORY: Neck trauma (Age >= 65y). alcohol  intoxication. Patient stated he drank 2 pints of kettle one vodka and some shooters since this morning Patient presents with nausea and dry heaving at this time. Notes from chart; 100 ml omni 300. FINDINGS: CERVICAL SPINE: BONES AND ALIGNMENT: No acute fracture or traumatic malalignment. DEGENERATIVE CHANGES: Moderate chronic degenerative disc disease at C5-C6 and C6-C7. Degenerative disc bulging and endplate ridging at C5-C6 and C6-C7 causing moderate central spinal canal stenosis and moderate bilateral neural foraminal stenosis. Partial ankylosis of the facet joints at C2-C3. SOFT TISSUES: No  prevertebral soft tissue swelling. IMPRESSION: 1. No acute abnormality of the cervical spine. 2. Moderate chronic degenerative disc disease at C5-6 and C6-7 with associated moderate central canal and bilateral neural foraminal stenosis. 3. Partial ankylosis of the C2-3 facet joints. Electronically signed by: Evalene Coho MD 01/10/2024 05:18 AM EDT RP Workstation: [id number]   CT CHEST ABDOMEN PELVIS W CONTRAST Result Date: 01/10/2024 EXAM: CT CHEST, ABDOMEN AND PELVIS WITH CONTRAST 01/10/2024 04:40:40 AM TECHNIQUE: CT of the chest, abdomen and pelvis was performed with the administration of 100 mL of iohexol  (OMNIPAQUE ) 300 MG/ML solution. Multiplanar reformatted images are provided for review. Automated exposure control, iterative reconstruction, and/or weight based adjustment of the  mA/kV was utilized to reduce the radiation dose to as low as reasonably achievable. COMPARISON: CT of the abdomen and pelvis dated 07/07/2023. CLINICAL HISTORY: Polytrauma, blunt. alcohol  intoxication. Patient stated he drank 2 pints of kettle one vodka and some shooters since this morning Patient presents with nausea and dry heaving at this time. Notes from chart; 100 ml omni 300. FINDINGS: CHEST: MEDIASTINUM AND LYMPH NODES: Heart and pericardium are unremarkable. The central airways are clear. No mediastinal, hilar or axillary lymphadenopathy. LUNGS AND PLEURA: Mild atelectasis present dependently within the lungs bilaterally. There is a trace right-sided pleural effusion. No pneumothorax. No focal consolidation or pulmonary edema. ABDOMEN AND PELVIS: LIVER: There is diffuse fatty infiltration of the liver. An ovoid nodule is again demonstrated in the left upper quadrant measuring approximately 14 x 13 x 14 mm which appears unchanged from the prior study. It is seen on image 56 of series 4. GALLBLADDER AND BILE DUCTS: Gallbladder is unremarkable. No biliary ductal dilatation. SPLEEN: No acute abnormality. PANCREAS: No acute  abnormality. ADRENAL GLANDS: No acute abnormality. KIDNEYS, URETERS AND BLADDER: No stones in the kidneys or ureters. No hydronephrosis. No perinephric or periureteral stranding. Urinary bladder is unremarkable. GI AND BOWEL: The patient is status post Nissen fundal ligation. Stomach demonstrates no acute abnormality. There is no bowel obstruction. REPRODUCTIVE ORGANS: No acute abnormality. PERITONEUM AND RETROPERITONEUM: No ascites. No free air. VASCULATURE: Aorta is normal in caliber. There is calcific atheromatous disease within the abdominal aorta and iliac arteries. ABDOMINAL AND PELVIS LYMPH NODES: No lymphadenopathy. BONES AND SOFT TISSUES: There is bilateral gynecomastia. No acute osseous abnormality. IMPRESSION: 1. No acute traumatic injury identified in the chest, abdomen, or pelvis. 2. Trace right pleural effusion with mild dependent atelectasis. 3. Diffuse hepatic steatosis. Electronically signed by: Evalene Coho MD 01/10/2024 05:15 AM EDT RP Workstation: [id number]   CT Head Wo Contrast Result Date: 01/10/2024 EXAM: CT HEAD WITHOUT CONTRAST 01/10/2024 04:40:40 AM TECHNIQUE: CT of the head was performed without the administration of intravenous contrast. Automated exposure control, iterative reconstruction, and/or weight based adjustment of the mA/kV was utilized to reduce the radiation dose to as low as reasonably achievable. COMPARISON: CT of the head dated 12/16/2023. CLINICAL HISTORY: Head trauma, minor (Age >= 65y). alcohol  intoxication. Patient stated he drank 2 pints of kettle one vodka and some shooters since this morning Patient presents with nausea and dry heaving at this time. Notes from chart; 100 ml omni 300. FINDINGS: BRAIN AND VENTRICLES: No acute hemorrhage. No evidence of acute infarct. No hydrocephalus. No extra-axial collection. No mass effect or midline shift. ORBITS: No acute abnormality. SINUSES: Mucous retention cyst is again noted within the right frontal sinus. SOFT  TISSUES AND SKULL: Debris is also again demonstrated within the external auditory canals bilaterally. No acute soft tissue abnormality. No skull fracture. IMPRESSION: 1. No acute intracranial abnormality. Electronically signed by: Evalene Coho MD 01/10/2024 05:09 AM EDT RP Workstation: [id number]   DG Pelvis Portable Result Date: 12/16/2023 CLINICAL DATA:  Altered mental status.  Shortness of breath. EXAM: PORTABLE PELVIS 1-2 VIEWS COMPARISON:  None. FINDINGS: No evidence for an acute fracture. SI joints and symphysis pubis unremarkable. Mild degenerative changes are noted in both hips. IMPRESSION: 1. No acute bony findings. 2. Mild degenerative changes in both hips. Electronically Signed   By: Camellia Candle M.D.   On: 12/16/2023 07:37   DG Chest 2 View Result Date: 12/16/2023 CLINICAL DATA:  Shortness of breath and altered mental status. EXAM: CHEST - 2 VIEW COMPARISON:  08/31/2023 FINDINGS: Interval improvement in basilar aeration with increased lung expansion since the prior study. There is some persistent basilar atelectasis, left greater than right with tiny pleural effusion noted on the lateral film. Cardiopericardial silhouette is at upper limits of normal for size. No acute bony abnormality. Telemetry leads overlie the chest. IMPRESSION: Interval improvement in basilar aeration with persistent basilar atelectasis and tiny pleural effusion. Electronically Signed   By: Camellia Candle M.D.   On: 12/16/2023 07:36   CT HEAD WO CONTRAST Result Date: 12/16/2023 CLINICAL DATA:  Mental status change, unknown cause. EXAM: CT HEAD WITHOUT CONTRAST TECHNIQUE: Contiguous axial images were obtained from the base of the skull through the vertex without intravenous contrast. RADIATION DOSE REDUCTION: This exam was performed according to the departmental dose-optimization program which includes automated exposure control, adjustment of the mA and/or kV according to patient size and/or use of iterative reconstruction  technique. COMPARISON:  Head CT 09/01/2023. FINDINGS: Brain: There is mild cerebral atrophy and small-vessel disease. Unremarkable cerebellum and brainstem. The ventricles are normal in size and position. No cortical based acute infarct, hemorrhage, mass or mass effect is seen. There is no midline shift.  The basal cisterns are clear. Vascular: Patchy calcific plaque both siphons, distal left vertebral artery. No hyperdense central vessel is seen. Skull: Negative for fractures or focal lesions. Sinuses/Orbits: There is mild chronic membrane disease in the ethmoids, 1.8 cm retention cyst or polyp in the right frontal sinus and mild membrane thickening in the maxillary cavities. The other sinuses, bilateral mastoid air cells and middle ears are clear. Wax impactions are again noted in both external auditory canals. Negative orbits. Other: None. IMPRESSION: 1. No acute intracranial CT findings or interval changes. 2. Sinus disease. 3. Wax impactions in both external auditory canals. Electronically Signed   By: Francis Quam M.D.   On: 12/16/2023 07:17   Microbiology: Results for orders placed or performed during the hospital encounter of 01/10/24  Resp panel by RT-PCR (RSV, Flu A&B, Covid) Anterior Nasal Swab     Status: None   Collection Time: 01/10/24  2:09 AM   Specimen: Anterior Nasal Swab  Result Value Ref Range Status   SARS Coronavirus 2 by RT PCR NEGATIVE NEGATIVE Final    Comment: (NOTE) SARS-CoV-2 target nucleic acids are NOT DETECTED.  The SARS-CoV-2 RNA is generally detectable in upper respiratory specimens during the acute phase of infection. The lowest concentration of SARS-CoV-2 viral copies this assay can detect is 138 copies/mL. A negative result does not preclude SARS-Cov-2 infection and should not be used as the sole basis for treatment or other patient management decisions. A negative result may occur with  improper specimen collection/handling, submission of specimen other than  nasopharyngeal swab, presence of viral mutation(s) within the areas targeted by this assay, and inadequate number of viral copies(<138 copies/mL). A negative result must be combined with clinical observations, patient history, and epidemiological information. The expected result is Negative.  Fact Sheet for Patients:  BloggerCourse.com  Fact Sheet for Healthcare Providers:  SeriousBroker.it  This test is no t yet approved or cleared by the [place]  FDA and  has been authorized for detection and/or diagnosis of SARS-CoV-2 by FDA under an Emergency Use Authorization (EUA). This EUA will remain  in effect (meaning this test can be used) for the duration of the COVID-19 declaration under Section 564(b)(1) of the Act, 21 U.S.C.section 360bbb-3(b)(1), unless the authorization is terminated  or revoked sooner.       Influenza A  by PCR NEGATIVE NEGATIVE Final   Influenza B by PCR NEGATIVE NEGATIVE Final    Comment: (NOTE) The Xpert Xpress SARS-CoV-2/FLU/RSV plus assay is intended as an aid in the diagnosis of influenza from Nasopharyngeal swab specimens and should not be used as a sole basis for treatment. Nasal washings and aspirates are unacceptable for Xpert Xpress SARS-CoV-2/FLU/RSV testing.  Fact Sheet for Patients: BloggerCourse.com  Fact Sheet for Healthcare Providers: SeriousBroker.it  This test is not yet approved or cleared by the [place]  FDA and has been authorized for detection and/or diagnosis of SARS-CoV-2 by FDA under an Emergency Use Authorization (EUA). This EUA will remain in effect (meaning this test can be used) for the duration of the COVID-19 declaration under Section 564(b)(1) of the Act, 21 U.S.C. section 360bbb-3(b)(1), unless the authorization is terminated or revoked.     Resp Syncytial Virus by PCR NEGATIVE NEGATIVE Final    Comment:  (NOTE) Fact Sheet for Patients: BloggerCourse.com  Fact Sheet for Healthcare Providers: SeriousBroker.it  This test is not yet approved or cleared by the [place]  FDA and has been authorized for detection and/or diagnosis of SARS-CoV-2 by FDA under an Emergency Use Authorization (EUA). This EUA will remain in effect (meaning this test can be used) for the duration of the COVID-19 declaration under Section 564(b)(1) of the Act, 21 U.S.C. section 360bbb-3(b)(1), unless the authorization is terminated or revoked.  Performed at [facility], [address] [place], [place] [address]    Labs: CBC: Recent Labs  Lab 01/10/24 0148 01/11/24 0933  WBC 6.8 4.6  NEUTROABS 4.0 2.9  HGB 16.7 13.6  HCT 52.4* 41.9  MCV 91.1 89.7  PLT 198 110*   Basic Metabolic Panel: Recent Labs  Lab 01/10/24 0148 01/11/24 0933  NA 143 136  K 4.2 3.6  CL 101 105  CO2 16* 21*  GLUCOSE 80 121*  BUN 12 9  CREATININE 1.02 0.86  CALCIUM  9.0 8.5*  MG  --  2.0  PHOS  --  1.2*   Liver Function Tests: Recent Labs  Lab 01/10/24 0148 01/11/24 0933  AST 38 23  ALT 30 21  ALKPHOS 93 68  BILITOT 0.5 0.6  PROT 7.9 6.0*  ALBUMIN 4.7 3.7   CBG: Recent Labs  Lab 01/10/24 0350  GLUCAP 86   Discharge time spent: greater than 30 minutes.  Signed: Alejandro Marker, DO [facility] 01/14/2024

## 2024-01-13 NOTE — Hospital Course (Signed)
 Ruben [name] is a 73 y.o. male with medical history significant for Mann[place] to alcoholism, history of BPH, depression anxiety, history of aortic atherosclerosis, history of hypertension, hypercholesterolemia, prediabetes as well as other comorbidities who presents after he was intoxicated with alcohol .  He reportedly called EMS due to alcohol  intoxication and he states that he drank 2 pints of kettle one vodka with some shooters subsequently started having some nausea and dry heaving.  Patient states that he started drinking around 6 AM and because of his intoxication called EMS and he was brought to the hospital and was somnolent. He denies any chest pain or shortness of breath he denied any chest pain or shortness of breath. Patient was tremulous at the time of arrival and [facility] was asked to admit this patient for evaluation for alcohol  intoxication with concern for active withdrawal.   **Interim History: Patient improved from his alcohol  intoxication and was not really withdrawing from alcohol  anymore and his tachycardia improved.  He was deemed medically stable for discharge and was advised to monitor how much he drinks and resources were given to him by the social work team.  His heart rate is improved and his beta-blocker was resumed..  Assessment and Plan:  Alcohol  intoxication with concern for withdrawal: Admitted to the progressive care unit and started on IV fluid hydration with D5 half-normal saline at 100 mL/h for 1 day.  Given droperidol and ED for nausea and haloperidol .  Continue with CIWA protocol scheduled and as needed and he only received 2 mg IV Ativan  in the ED.  Initiate multivitamin with minerals, folic acid  1 mg p.o. daily and vitamin B12.  Given a 1 L bolus.  Watch for any delirium or seizures.  Continue with antiemetics as ondansetron .  Alcohol  ethyl level was 264 on admission   BPH: Continue Tamsulosin    Tachycardia: In setting of alcohol  intoxication.  EKG as above.  Check TSH  and was 1.010 with repeat being 3.220 with free T4 being 0.75 continue with IV fluid hydration.  Resume his home metoprolol  to tartrate 50 mg p.o. twice daily and placed on IV metoprolol  to tartrate 5 mg every 8 as needed for heart rate that sustained greater than 120.  Continue with IV fluid hydration as above.  Continue monitoring telemetry and if continues to be tachycardic and may be having atrial tachycardia will discuss with cardiology.  Heart rate rhythm was improved prior to discharge.   Essential hypertension: Continue with his home medications including lisinopril  5 mg p.o. daily, clonidine  0.1 mg p.o. twice daily and metoprolol  tartrate 50 mg p.o. twice daily.  Continue to monitor blood pressure per protocol and patient was placed on IV hydralazine  10 mg every 6 as needed for high blood pressure.  Last blood pressure reading was 166/92   Elevated anion gap metabolic acidosis: Has a significantly elevated anion gap metabolic acidosis with a CO2 of 16, anion gap of 26 and chloride level 101 on admission and his anion gap metabolic acidosis was improving and now have a CO2 of 21, anion gap of 11, chloride level of 105  Thrombocytopenia: Plt Count went from 198 -> 110.  Continue to monitor for signs and symptoms bleeding; no overt bleeding noted.  Will need repeat CBC within 1 week  Hypophosphatemia: Phos level was extremely low at 1.2.  Replete with IV K-Phos 30 mmol prior to discharge.  Repeat Phos level within 1 week.   Hyperlipidemia: Continue statin   Depression and anxiety: Continue with  hydroxyzine  50 mg p.o. nightly as needed for anxiety, mirtazapine  7.5 mg p.o. nightly as needed for sleep and anxiety, and venlafaxine  XR 75 mg p.o. daily   Overweight: Complicates overall prognosis and care. Estimated body mass index is 28.87 kg/m as calculated from the following:   Height as of this encounter: 5' 11 (1.803 m).   Weight as of this encounter: 93.9 kg. Weight Loss and Dietary Counseling  given

## 2024-02-11 DIAGNOSIS — Z743 Need for continuous supervision: Secondary | ICD-10-CM | POA: Diagnosis not present

## 2024-02-11 DIAGNOSIS — I10 Essential (primary) hypertension: Secondary | ICD-10-CM | POA: Diagnosis not present

## 2024-02-11 DIAGNOSIS — E875 Hyperkalemia: Secondary | ICD-10-CM | POA: Diagnosis not present

## 2024-02-11 DIAGNOSIS — N400 Enlarged prostate without lower urinary tract symptoms: Secondary | ICD-10-CM | POA: Diagnosis not present

## 2024-02-11 DIAGNOSIS — I4892 Unspecified atrial flutter: Secondary | ICD-10-CM | POA: Diagnosis not present

## 2024-02-11 DIAGNOSIS — F10929 Alcohol use, unspecified with intoxication, unspecified: Secondary | ICD-10-CM | POA: Diagnosis not present

## 2024-02-11 DIAGNOSIS — R109 Unspecified abdominal pain: Secondary | ICD-10-CM | POA: Diagnosis not present

## 2024-02-11 DIAGNOSIS — E785 Hyperlipidemia, unspecified: Secondary | ICD-10-CM | POA: Diagnosis not present

## 2024-02-11 DIAGNOSIS — J90 Pleural effusion, not elsewhere classified: Secondary | ICD-10-CM | POA: Diagnosis not present

## 2024-02-11 DIAGNOSIS — R079 Chest pain, unspecified: Secondary | ICD-10-CM | POA: Diagnosis not present

## 2024-02-11 DIAGNOSIS — E8729 Other acidosis: Secondary | ICD-10-CM | POA: Diagnosis not present

## 2024-02-11 DIAGNOSIS — E1110 Type 2 diabetes mellitus with ketoacidosis without coma: Secondary | ICD-10-CM | POA: Diagnosis not present

## 2024-02-11 DIAGNOSIS — F10229 Alcohol dependence with intoxication, unspecified: Secondary | ICD-10-CM | POA: Diagnosis not present

## 2024-02-11 DIAGNOSIS — E872 Acidosis, unspecified: Secondary | ICD-10-CM | POA: Diagnosis not present

## 2024-02-11 DIAGNOSIS — F10129 Alcohol abuse with intoxication, unspecified: Secondary | ICD-10-CM | POA: Diagnosis not present

## 2024-02-11 DIAGNOSIS — G4700 Insomnia, unspecified: Secondary | ICD-10-CM | POA: Diagnosis not present

## 2024-02-11 DIAGNOSIS — F1020 Alcohol dependence, uncomplicated: Secondary | ICD-10-CM | POA: Diagnosis not present

## 2024-02-17 DIAGNOSIS — S199XXA Unspecified injury of neck, initial encounter: Secondary | ICD-10-CM | POA: Diagnosis not present

## 2024-02-17 DIAGNOSIS — R079 Chest pain, unspecified: Secondary | ICD-10-CM | POA: Diagnosis not present

## 2024-02-17 DIAGNOSIS — R55 Syncope and collapse: Secondary | ICD-10-CM | POA: Diagnosis not present

## 2024-02-17 DIAGNOSIS — F1010 Alcohol abuse, uncomplicated: Secondary | ICD-10-CM | POA: Diagnosis not present

## 2024-02-17 DIAGNOSIS — F10129 Alcohol abuse with intoxication, unspecified: Secondary | ICD-10-CM | POA: Diagnosis not present

## 2024-02-17 DIAGNOSIS — R942 Abnormal results of pulmonary function studies: Secondary | ICD-10-CM | POA: Diagnosis not present

## 2024-02-17 DIAGNOSIS — N400 Enlarged prostate without lower urinary tract symptoms: Secondary | ICD-10-CM | POA: Diagnosis not present

## 2024-02-17 DIAGNOSIS — K760 Fatty (change of) liver, not elsewhere classified: Secondary | ICD-10-CM | POA: Diagnosis not present

## 2024-02-17 DIAGNOSIS — R000 Tachycardia, unspecified: Secondary | ICD-10-CM | POA: Diagnosis not present

## 2024-02-17 DIAGNOSIS — J90 Pleural effusion, not elsewhere classified: Secondary | ICD-10-CM | POA: Diagnosis not present

## 2024-02-17 DIAGNOSIS — I119 Hypertensive heart disease without heart failure: Secondary | ICD-10-CM | POA: Diagnosis not present

## 2024-02-17 DIAGNOSIS — I6529 Occlusion and stenosis of unspecified carotid artery: Secondary | ICD-10-CM | POA: Diagnosis not present

## 2024-03-05 DIAGNOSIS — Z0000 Encounter for general adult medical examination without abnormal findings: Secondary | ICD-10-CM | POA: Diagnosis not present

## 2024-03-05 DIAGNOSIS — Z23 Encounter for immunization: Secondary | ICD-10-CM | POA: Diagnosis not present

## 2024-03-17 DIAGNOSIS — Z0000 Encounter for general adult medical examination without abnormal findings: Secondary | ICD-10-CM | POA: Diagnosis not present

## 2024-03-17 DIAGNOSIS — F5101 Primary insomnia: Secondary | ICD-10-CM | POA: Diagnosis not present

## 2024-03-17 DIAGNOSIS — Z23 Encounter for immunization: Secondary | ICD-10-CM | POA: Diagnosis not present

## 2024-03-17 DIAGNOSIS — F1010 Alcohol abuse, uncomplicated: Secondary | ICD-10-CM | POA: Diagnosis not present

## 2024-03-17 DIAGNOSIS — N1831 Chronic kidney disease, stage 3a: Secondary | ICD-10-CM | POA: Diagnosis not present

## 2024-03-17 DIAGNOSIS — K219 Gastro-esophageal reflux disease without esophagitis: Secondary | ICD-10-CM | POA: Diagnosis not present

## 2024-03-17 DIAGNOSIS — E782 Mixed hyperlipidemia: Secondary | ICD-10-CM | POA: Diagnosis not present

## 2024-03-17 DIAGNOSIS — I700 Atherosclerosis of aorta: Secondary | ICD-10-CM | POA: Diagnosis not present

## 2024-03-17 DIAGNOSIS — I10 Essential (primary) hypertension: Secondary | ICD-10-CM | POA: Diagnosis not present

## 2024-03-19 ENCOUNTER — Encounter (HOSPITAL_COMMUNITY): Payer: Self-pay

## 2024-03-19 ENCOUNTER — Other Ambulatory Visit: Payer: Self-pay

## 2024-03-19 ENCOUNTER — Inpatient Hospital Stay (HOSPITAL_COMMUNITY)
Admission: EM | Admit: 2024-03-19 | Discharge: 2024-03-26 | DRG: 897 | Disposition: A | Attending: Internal Medicine | Admitting: Internal Medicine

## 2024-03-19 DIAGNOSIS — Z79899 Other long term (current) drug therapy: Secondary | ICD-10-CM

## 2024-03-19 DIAGNOSIS — Z8249 Family history of ischemic heart disease and other diseases of the circulatory system: Secondary | ICD-10-CM | POA: Diagnosis not present

## 2024-03-19 DIAGNOSIS — I10 Essential (primary) hypertension: Secondary | ICD-10-CM | POA: Diagnosis present

## 2024-03-19 DIAGNOSIS — Z823 Family history of stroke: Secondary | ICD-10-CM

## 2024-03-19 DIAGNOSIS — F419 Anxiety disorder, unspecified: Secondary | ICD-10-CM | POA: Diagnosis present

## 2024-03-19 DIAGNOSIS — F10220 Alcohol dependence with intoxication, uncomplicated: Secondary | ICD-10-CM | POA: Diagnosis present

## 2024-03-19 DIAGNOSIS — F10939 Alcohol use, unspecified with withdrawal, unspecified: Secondary | ICD-10-CM | POA: Diagnosis present

## 2024-03-19 DIAGNOSIS — F10230 Alcohol dependence with withdrawal, uncomplicated: Secondary | ICD-10-CM | POA: Diagnosis not present

## 2024-03-19 DIAGNOSIS — E039 Hypothyroidism, unspecified: Secondary | ICD-10-CM | POA: Diagnosis present

## 2024-03-19 DIAGNOSIS — E860 Dehydration: Secondary | ICD-10-CM | POA: Diagnosis present

## 2024-03-19 DIAGNOSIS — R0682 Tachypnea, not elsewhere classified: Secondary | ICD-10-CM | POA: Diagnosis present

## 2024-03-19 DIAGNOSIS — F32A Depression, unspecified: Secondary | ICD-10-CM | POA: Diagnosis present

## 2024-03-19 DIAGNOSIS — R251 Tremor, unspecified: Secondary | ICD-10-CM | POA: Diagnosis present

## 2024-03-19 DIAGNOSIS — F10930 Alcohol use, unspecified with withdrawal, uncomplicated: Secondary | ICD-10-CM | POA: Diagnosis not present

## 2024-03-19 DIAGNOSIS — Y907 Blood alcohol level of 200-239 mg/100 ml: Secondary | ICD-10-CM | POA: Diagnosis present

## 2024-03-19 DIAGNOSIS — Z9079 Acquired absence of other genital organ(s): Secondary | ICD-10-CM | POA: Diagnosis not present

## 2024-03-19 DIAGNOSIS — N400 Enlarged prostate without lower urinary tract symptoms: Secondary | ICD-10-CM | POA: Diagnosis present

## 2024-03-19 DIAGNOSIS — Z803 Family history of malignant neoplasm of breast: Secondary | ICD-10-CM

## 2024-03-19 DIAGNOSIS — E785 Hyperlipidemia, unspecified: Secondary | ICD-10-CM | POA: Diagnosis present

## 2024-03-19 DIAGNOSIS — I499 Cardiac arrhythmia, unspecified: Secondary | ICD-10-CM

## 2024-03-19 DIAGNOSIS — I4892 Unspecified atrial flutter: Secondary | ICD-10-CM | POA: Diagnosis present

## 2024-03-19 DIAGNOSIS — Z8711 Personal history of peptic ulcer disease: Secondary | ICD-10-CM

## 2024-03-19 DIAGNOSIS — F10920 Alcohol use, unspecified with intoxication, uncomplicated: Principal | ICD-10-CM

## 2024-03-19 DIAGNOSIS — R7303 Prediabetes: Secondary | ICD-10-CM | POA: Diagnosis present

## 2024-03-19 DIAGNOSIS — G629 Polyneuropathy, unspecified: Secondary | ICD-10-CM | POA: Diagnosis present

## 2024-03-19 LAB — URINALYSIS, ROUTINE W REFLEX MICROSCOPIC
Bilirubin Urine: NEGATIVE
Glucose, UA: NEGATIVE mg/dL
Ketones, ur: 5 mg/dL — AB
Leukocytes,Ua: NEGATIVE
Nitrite: NEGATIVE
Protein, ur: NEGATIVE mg/dL
Specific Gravity, Urine: 1.009 (ref 1.005–1.030)
pH: 5 (ref 5.0–8.0)

## 2024-03-19 LAB — BLOOD GAS, VENOUS
Acid-base deficit: 5.8 mmol/L — ABNORMAL HIGH (ref 0.0–2.0)
Bicarbonate: 19.5 mmol/L — ABNORMAL LOW (ref 20.0–28.0)
O2 Saturation: 89.9 %
Patient temperature: 37
pCO2, Ven: 37 mmHg — ABNORMAL LOW (ref 44–60)
pH, Ven: 7.33 (ref 7.25–7.43)
pO2, Ven: 60 mmHg — ABNORMAL HIGH (ref 32–45)

## 2024-03-19 LAB — ETHANOL: Alcohol, Ethyl (B): 203 mg/dL — ABNORMAL HIGH (ref <15)

## 2024-03-19 LAB — COMPREHENSIVE METABOLIC PANEL WITH GFR
ALT: 18 U/L (ref 0–44)
AST: 29 U/L (ref 15–41)
Albumin: 3.9 g/dL (ref 3.5–5.0)
Alkaline Phosphatase: 83 U/L (ref 38–126)
Anion gap: 20 — ABNORMAL HIGH (ref 5–15)
BUN: 8 mg/dL (ref 8–23)
CO2: 18 mmol/L — ABNORMAL LOW (ref 22–32)
Calcium: 8.6 mg/dL — ABNORMAL LOW (ref 8.9–10.3)
Chloride: 104 mmol/L (ref 98–111)
Creatinine, Ser: 0.91 mg/dL (ref 0.61–1.24)
GFR, Estimated: >60 mL/min (ref >60)
Glucose, Bld: 91 mg/dL (ref 70–99)
Potassium: 4.5 mmol/L (ref 3.5–5.1)
Sodium: 142 mmol/L (ref 135–145)
Total Bilirubin: 0.4 mg/dL (ref 0.0–1.2)
Total Protein: 7.2 g/dL (ref 6.5–8.1)

## 2024-03-19 LAB — URINE DRUG SCREEN
Amphetamines: NEGATIVE
Barbiturates: NEGATIVE
Benzodiazepines: NEGATIVE
Cocaine: NEGATIVE
Fentanyl: NEGATIVE
Methadone Scn, Ur: NEGATIVE
Opiates: NEGATIVE
Tetrahydrocannabinol: NEGATIVE

## 2024-03-19 LAB — CBC WITH DIFFERENTIAL/PLATELET
Abs Immature Granulocytes: 0.01 K/uL (ref 0.00–0.07)
Basophils Absolute: 0.1 K/uL (ref 0.0–0.1)
Basophils Relative: 1 %
Eosinophils Absolute: 0.2 K/uL (ref 0.0–0.5)
Eosinophils Relative: 2 %
HCT: 49.3 % (ref 39.0–52.0)
Hemoglobin: 16.4 g/dL (ref 13.0–17.0)
Immature Granulocytes: 0 %
Lymphocytes Relative: 34 %
Lymphs Abs: 2.4 K/uL (ref 0.7–4.0)
MCH: 30.4 pg (ref 26.0–34.0)
MCHC: 33.3 g/dL (ref 30.0–36.0)
MCV: 91.3 fL (ref 80.0–100.0)
Monocytes Absolute: 0.6 K/uL (ref 0.1–1.0)
Monocytes Relative: 8 %
Neutro Abs: 4 K/uL (ref 1.7–7.7)
Neutrophils Relative %: 55 %
Platelets: 224 K/uL (ref 150–400)
RBC: 5.4 MIL/uL (ref 4.22–5.81)
RDW: 13.9 % (ref 11.5–15.5)
WBC: 7.3 K/uL (ref 4.0–10.5)
nRBC: 0 % (ref 0.0–0.2)

## 2024-03-19 LAB — CBG MONITORING, ED: Glucose-Capillary: 100 mg/dL — ABNORMAL HIGH (ref 70–99)

## 2024-03-19 LAB — MAGNESIUM: Magnesium: 2.1 mg/dL (ref 1.7–2.4)

## 2024-03-19 MED ORDER — ADULT MULTIVITAMIN W/MINERALS CH
1.0000 | ORAL_TABLET | Freq: Every day | ORAL | Status: DC
  Administered 2024-03-20 – 2024-03-26 (×7): 1 via ORAL
  Filled 2024-03-19 (×7): qty 1

## 2024-03-19 MED ORDER — POLYETHYLENE GLYCOL 3350 17 G PO PACK
17.0000 g | PACK | Freq: Every day | ORAL | Status: DC | PRN
  Administered 2024-03-20 – 2024-03-24 (×3): 17 g via ORAL
  Filled 2024-03-19 (×3): qty 1

## 2024-03-19 MED ORDER — FOLIC ACID 1 MG PO TABS
1.0000 mg | ORAL_TABLET | Freq: Every day | ORAL | Status: DC
  Administered 2024-03-20 – 2024-03-26 (×7): 1 mg via ORAL
  Filled 2024-03-19 (×7): qty 1

## 2024-03-19 MED ORDER — LACTATED RINGERS IV BOLUS
1000.0000 mL | Freq: Once | INTRAVENOUS | Status: AC
  Administered 2024-03-19: 1000 mL via INTRAVENOUS

## 2024-03-19 MED ORDER — ONDANSETRON HCL 4 MG/2ML IJ SOLN
4.0000 mg | Freq: Once | INTRAMUSCULAR | Status: AC
  Administered 2024-03-19: 4 mg via INTRAVENOUS
  Filled 2024-03-19: qty 2

## 2024-03-19 MED ORDER — LORAZEPAM 1 MG PO TABS: 0.0000 mg | ORAL_TABLET | Freq: Four times a day (QID) | ORAL | Status: DC

## 2024-03-19 MED ORDER — ORAL CARE MOUTH RINSE: 15.0000 mL | OROMUCOSAL | Status: DC | PRN

## 2024-03-19 MED ORDER — ENOXAPARIN SODIUM 40 MG/0.4ML IJ SOSY
40.0000 mg | PREFILLED_SYRINGE | INTRAMUSCULAR | Status: DC
  Administered 2024-03-19 – 2024-03-25 (×7): 40 mg via SUBCUTANEOUS
  Filled 2024-03-19 (×7): qty 0.4

## 2024-03-19 MED ORDER — LORAZEPAM 1 MG PO TABS: 1.0000 mg | ORAL_TABLET | ORAL | Status: AC | PRN

## 2024-03-19 MED ORDER — THIAMINE MONONITRATE 100 MG PO TABS
100.0000 mg | ORAL_TABLET | Freq: Every day | ORAL | Status: DC
  Administered 2024-03-20 – 2024-03-26 (×6): 100 mg via ORAL
  Filled 2024-03-19 (×7): qty 1

## 2024-03-19 MED ORDER — LORAZEPAM 2 MG/ML IJ SOLN: 0.0000 mg | Freq: Two times a day (BID) | INTRAMUSCULAR | Status: DC

## 2024-03-19 MED ORDER — THIAMINE HCL 100 MG/ML IJ SOLN
100.0000 mg | Freq: Every day | INTRAMUSCULAR | Status: DC
  Administered 2024-03-19 – 2024-03-23 (×2): 100 mg via INTRAVENOUS
  Filled 2024-03-19 (×2): qty 2

## 2024-03-19 MED ORDER — LORAZEPAM 2 MG/ML IJ SOLN
0.0000 mg | Freq: Four times a day (QID) | INTRAMUSCULAR | Status: DC
  Administered 2024-03-19 – 2024-03-20 (×2): 2 mg via INTRAVENOUS
  Filled 2024-03-19 (×2): qty 1

## 2024-03-19 MED ORDER — ACETAMINOPHEN 500 MG PO TABS: 500.0000 mg | ORAL_TABLET | Freq: Four times a day (QID) | ORAL | Status: DC | PRN

## 2024-03-19 MED ORDER — TRAZODONE HCL 50 MG PO TABS
50.0000 mg | ORAL_TABLET | Freq: Every evening | ORAL | Status: AC | PRN
  Administered 2024-03-22: 50 mg via ORAL
  Filled 2024-03-19: qty 1

## 2024-03-19 MED ORDER — LACTATED RINGERS IV SOLN: INTRAVENOUS | Status: DC

## 2024-03-19 MED ORDER — LORAZEPAM 2 MG/ML IJ SOLN
1.0000 mg | INTRAMUSCULAR | Status: DC | PRN
  Administered 2024-03-19 – 2024-03-21 (×6): 2 mg via INTRAVENOUS
  Filled 2024-03-19 (×6): qty 1

## 2024-03-19 MED ORDER — LORAZEPAM 1 MG PO TABS: 0.0000 mg | ORAL_TABLET | Freq: Two times a day (BID) | ORAL | Status: DC

## 2024-03-19 MED ORDER — CHLORHEXIDINE GLUCONATE CLOTH 2 % EX PADS
6.0000 | MEDICATED_PAD | Freq: Every day | CUTANEOUS | Status: DC
  Administered 2024-03-20 – 2024-03-25 (×5): 6 via TOPICAL

## 2024-03-19 MED ORDER — PROCHLORPERAZINE EDISYLATE 10 MG/2ML IJ SOLN: 5.0000 mg | Freq: Four times a day (QID) | INTRAMUSCULAR | Status: DC | PRN

## 2024-03-19 NOTE — ED Notes (Signed)
 HR 163, AED pads placed.

## 2024-03-19 NOTE — ED Triage Notes (Signed)
 Pt bib EMS Reports drinking too much. Wants to go to rehab. EMS reports pt was A&Ox4 and ambulatory when they arrived but changed while EMS was at the scene. Ems reports repetitive words. Agitation present. 4 mg zofran  given HR 120-130 96% RA 26 RR 160/100 BP CBG 97 20g LW

## 2024-03-19 NOTE — ED Provider Notes (Signed)
 [facility]PART[facility] AT [facility] Provider Note   CSN: [id number] Arrival date & time: 03/19/24  1515     History {Add pertinent medical, surgical, social history, OB history to HPI:1} Chief Complaint  Patient presents with   Alcohol  Intoxication    Ruben [name] is a 73 y.o. male with PMH as listed below who presents bib EMS Reports drinking too much. Wants to go to rehab. EMS reports pt was A&Ox4 and ambulatory when they arrived but changed while EMS was at the scene. Ems reports repetitive words. Agitation present. 4 mg zofran  given HR 120-130 96% RA 26 RR 160/100 BP CBG 97 20g LW .    Per chart review was admitted from 01/10/24-01/11/24 for EtOH intoxication w/ concern for withdrawal d/t tremors/agitation. During that time, patient improved from his intoxication and did not seem to be in withdrawal, was DC'd after monitoring/fluids/B12/folate.   Past Medical History:  Diagnosis Date   Alcoholism (HCC)    Anxiety    Aortic atherosclerosis 12/26/2016   Noted on CT   Arthritis    BPH (benign prostatic hyperplasia)    Chronic constipation    takes magnesium    Depression    Duodenal ulcer    Dyspnea 12/17/2017   ED (erectile dysfunction)    GERD (gastroesophageal reflux disease)    Heart palpitations    Occ   Hiatal hernia    History of adenomatous polyp of colon    tubular adenoma's 2005   History of Helicobacter pylori infection    2008   Hypercholesteremia    Hypertension    MR (mitral regurgitation)    Mild, noted on ECHO   Palpitations    Pre-diabetes    Pulmonary nodule 12/26/2016   noted on CT, 5 x 3 mm nodular opacity right upper lobe   Rosacea    Schatzki's ring    last dilated 02/ 2016   Substance abuse (HCC)    Urinary retention    history of   Varicocele 02/25/2014   Small to moderate left varicocele       Home Medications Prior to Admission medications   Medication Sig Start Date End Date Taking? Authorizing  Provider  acetaminophen  (TYLENOL ) 325 MG tablet Take 2 tablets (650 mg total) by mouth every 6 (six) hours as needed for mild pain (pain score 1-3) or fever (or Fever >/= 101). 01/11/24   Sherrill Cable Latif, DO  albuterol  (VENTOLIN  HFA) 108 (90 Base) MCG/ACT inhaler Inhale 2 puffs into the lungs every 6 (six) hours as needed for wheezing or shortness of breath. 05/24/23   [provider]  cloNIDine  (CATAPRES ) 0.1 MG tablet Take 1 tablet (0.1 mg total) by mouth 2 (two) times daily. 02/04/23   Gonfa, Taye T, MD  desvenlafaxine (PRISTIQ) 50 MG 24 hr tablet Take 50 mg by mouth as needed. Patient not taking: Reported on 01/11/2024 03/05/23   [provider]  finasteride  (PROSCAR ) 5 MG tablet Take 5 mg by mouth daily.    [provider]  folic acid  (FOLVITE ) 1 MG tablet Take 1 tablet (1 mg total) by mouth daily. 01/12/24   Sherrill Cable Latif, DO  gabapentin  (NEURONTIN ) 300 MG capsule Take 300 mg by mouth at bedtime.    [provider]  hydrOXYzine  (ATARAX ) 50 MG tablet Take 50 mg by mouth at bedtime as needed for anxiety.    [provider]  lactulose  (CHRONULAC ) 10 GM/15ML solution Take 10 g by mouth every other day. 08/24/23  [provider]  lisinopril  (ZESTRIL ) 5 MG tablet Take 5 mg by mouth daily. Patient not taking: Reported on 01/11/2024 12/24/23   [provider]  metoprolol  tartrate (LOPRESSOR ) 50 MG tablet Take 50 mg by mouth 2 (two) times daily.    [provider]  mirtazapine  (REMERON ) 7.5 MG tablet Take 7.5 mg by mouth at bedtime as needed (for sleep and anxiety). Patient not taking: Reported on 01/11/2024 08/22/23   [provider]  Multiple Vitamin (MULTIVITAMIN WITH MINERALS) TABS tablet Take 1 tablet by mouth daily. 01/12/24   Sheikh, Omair Latif, DO  naltrexone (DEPADE) 50 MG tablet Take 50 mg by mouth 2 (two) times daily. 12/12/23   [provider]  ondansetron  (ZOFRAN ) 4 MG tablet Take 1 tablet (4 mg  total) by mouth every 6 (six) hours as needed for nausea. 01/11/24   Sherrill Cable Latif, DO  QUEtiapine  (SEROQUEL ) 300 MG tablet Take 300 mg by mouth at bedtime. 12/02/22   [provider]  rosuvastatin  (CRESTOR ) 20 MG tablet Take 20 mg by mouth every Monday, Wednesday, and Friday. 08/23/21   [provider]  tamsulosin  (FLOMAX ) 0.4 MG CAPS capsule Take 0.4 mg by mouth in the morning.    [provider]  thiamine  (VITAMIN B-1) 100 MG tablet Take 1 tablet (100 mg total) by mouth daily. 01/11/24   Sherrill Cable Latif, DO  traZODone  (DESYREL ) 50 MG tablet Take 50 mg by mouth at bedtime as needed for sleep. 10/25/23   [provider]      Allergies    Patient has no known allergies.    Review of Systems   Review of Systems A 10 point review of systems was performed and is negative unless otherwise reported in HPI.  Physical Exam Updated Vital Signs BP (!) 175/110   Pulse (!) 135   Temp 98.2 F (36.8 C) (Oral)   Resp (!) 24   Ht 5' 11 (1.803 m)   Wt 93.9 kg   SpO2 98%   BMI 28.87 kg/m  Physical Exam General: Normal appearing {Desc; male/male:11659}, lying in bed.  HEENT: PERRLA, Sclera anicteric, MMM, trachea midline.  Cardiology: RRR, no murmurs/rubs/gallops. BL radial and DP pulses equal bilaterally.  Resp: Normal respiratory rate and effort. CTAB, no wheezes, rhonchi, crackles.  Abd: Soft, non-tender, non-distended. No rebound tenderness or guarding.  GU: Deferred. MSK: No peripheral edema or signs of trauma. Extremities without deformity or TTP. No cyanosis or clubbing. Skin: warm, dry. No rashes or lesions. Back: No CVA tenderness Neuro: A&Ox4, CNs II-XII grossly intact. MAEs. Sensation grossly intact.  Psych: Normal mood and affect.   ED Results / Procedures / Treatments   Labs (all labs ordered are listed, but only abnormal results are displayed) Labs Reviewed - No data to display  EKG None  Radiology No results  found.  Procedures Procedures  {Document cardiac monitor, telemetry assessment procedure when appropriate:1}  Medications Ordered in ED Medications - No data to display  ED Course/ Medical Decision Making/ A&P                          Medical Decision Making Amount and/or Complexity of Data Reviewed Labs: ordered. Decision-making details documented in ED Course.  Risk OTC drugs. Prescription drug management. Decision regarding hospitalization.    This patient presents to the ED for concern of ***, this involves an extensive number of treatment options, and is a complaint that carries with it a high  risk of complications and morbidity.  I considered the following differential and admission for this acute, potentially life threatening condition.   MDM:    ***  Clinical Course as of 03/22/24 1645  Wed Mar 19, 2024  1556 Converting between sinus tachycardia w/ rates in 120s-130s bpm with atrial flutter rates 140-155 bpm. Patient with no chest pain. BP in 150s-170s systolic.  [HN]  1600 Glucose-Capillary(!): 100 [HN]  1600 CBC with Differential unremarkable [HN]  1600 Patient requests ativan  specifically, stating Iva thinks he could use some ativan ! No tremors, no tongue fasciculations, no nausea/vomiting, very low c/f overall for alcohol  withdrawal at this time especially in context of recent admission w/ similar sxs related to alcohol  intoxication.  [HN]  1618 Alcohol , Ethyl (B)(!): 203 [HN]  1659 ECG Heart Rate(!): 115 [HN]  1659 CO2(!): 18 [HN]  1659 Anion gap(!): 20 Similar to prior episodes of EtOH intoxication/dehydration [HN]  1659 Magnesium : 2.1 [HN]  1730 Reevaluated, patient's HR improving, sinus tachycardia ~110 bpm. Patient is not agitated, follows directions, answers questions appropriately though still does seem mildly intoxicated. He again asks for ativan  and asks to be put to sleep. Explained to patient that we do not sedate or put to sleep every patient  in the ED.  [HN]  1939 Patient developed N/V while in the ED, improved some with zofran . Also developed mild tremor in jaw and hands with arms extended. States he feels like he is beginning to withdraw. His sinus tachycardia never fully improved w/ fluids. With his newly developing likely withdrawal concurrent w/ intoxication as well as his tachyarrhythmia observed earlier, believe admission would best serve this patient. He hasn't had any more episodes of the tachyarrhythmia observed on telemetry. Will consult for admission. [HN]    Clinical Course User Index [HN] Franklyn Sid SAILOR, MD    Labs: I Ordered, and personally interpreted labs.  The pertinent results include:  ***  Imaging Studies ordered: I ordered imaging studies including *** I independently visualized and interpreted imaging. I agree with the radiologist interpretation  Additional history obtained from ***.  External records from outside source obtained and reviewed including ***  Cardiac Monitoring: The patient was maintained on a cardiac monitor.  I personally viewed and interpreted the cardiac monitored which showed an underlying rhythm of: ***  Reevaluation: After the interventions noted above, I reevaluated the patient and found that they have :{resolved/improved/worsened:23923::improved}  Social Determinants of Health: ***  Disposition:  ***  Co morbidities that complicate the patient evaluation  Past Medical History:  Diagnosis Date   Alcoholism (HCC)    Anxiety    Aortic atherosclerosis 12/26/2016   Noted on CT   Arthritis    BPH (benign prostatic hyperplasia)    Chronic constipation    takes magnesium    Depression    Duodenal ulcer    Dyspnea 12/17/2017   ED (erectile dysfunction)    GERD (gastroesophageal reflux disease)    Heart palpitations    Occ   Hiatal hernia    History of adenomatous polyp of colon    tubular adenoma's 2005   History of Helicobacter pylori infection    2008    Hypercholesteremia    Hypertension    MR (mitral regurgitation)    Mild, noted on ECHO   Palpitations    Pre-diabetes    Pulmonary nodule 12/26/2016   noted on CT, 5 x 3 mm nodular opacity right upper lobe   Rosacea    Schatzki's ring    last  dilated 02/ 2016   Substance abuse (HCC)    Urinary retention    history of   Varicocele 02/25/2014   Small to moderate left varicocele     Medicines No orders of the defined types were placed in this encounter.   I have reviewed the patients home medicines and have made adjustments as needed  Problem List / ED Course: Problem List Items Addressed This Visit   None        {Document critical care time when appropriate:1} {Document review of labs and clinical decision tools ie heart score, Chads2Vasc2 etc:1}  {Document your independent review of radiology images, and any outside records:1} {Document your discussion with family members, caretakers, and with consultants:1} {Document social determinants of health affecting pt's care:1} {Document your decision making why or why not admission, treatments were needed:1}  This note was created using dictation software, which may contain spelling or grammatical errors.

## 2024-03-19 NOTE — ED Notes (Signed)
 Pt O2 noted to be 87% RA after medication admin. Pt placed on 2L [place]

## 2024-03-20 DIAGNOSIS — F10930 Alcohol use, unspecified with withdrawal, uncomplicated: Secondary | ICD-10-CM

## 2024-03-20 LAB — CBC
HCT: 40.9 % (ref 39.0–52.0)
Hemoglobin: 13.6 g/dL (ref 13.0–17.0)
MCH: 30.6 pg (ref 26.0–34.0)
MCHC: 33.3 g/dL (ref 30.0–36.0)
MCV: 91.9 fL (ref 80.0–100.0)
Platelets: 154 K/uL (ref 150–400)
RBC: 4.45 MIL/uL (ref 4.22–5.81)
RDW: 13.9 % (ref 11.5–15.5)
WBC: 5.7 K/uL (ref 4.0–10.5)
nRBC: 0 % (ref 0.0–0.2)

## 2024-03-20 LAB — CREATININE, SERUM
Creatinine, Ser: 0.85 mg/dL (ref 0.61–1.24)
GFR, Estimated: >60 mL/min (ref >60)

## 2024-03-20 LAB — MRSA NEXT GEN BY PCR, NASAL: MRSA by PCR Next Gen: NOT DETECTED

## 2024-03-20 MED ORDER — FINASTERIDE 5 MG PO TABS
5.0000 mg | ORAL_TABLET | Freq: Every day | ORAL | Status: DC
  Administered 2024-03-20 – 2024-03-26 (×7): 5 mg via ORAL
  Filled 2024-03-20 (×7): qty 1

## 2024-03-20 MED ORDER — CHLORDIAZEPOXIDE HCL 5 MG PO CAPS: 25.0000 mg | ORAL_CAPSULE | Freq: Every day | ORAL | Status: DC

## 2024-03-20 MED ORDER — CHLORDIAZEPOXIDE HCL 5 MG PO CAPS
25.0000 mg | ORAL_CAPSULE | Freq: Four times a day (QID) | ORAL | Status: AC
  Administered 2024-03-20 (×4): 25 mg via ORAL
  Filled 2024-03-20: qty 1
  Filled 2024-03-20: qty 5
  Filled 2024-03-20: qty 1
  Filled 2024-03-20: qty 5

## 2024-03-20 MED ORDER — TAMSULOSIN HCL 0.4 MG PO CAPS
0.4000 mg | ORAL_CAPSULE | Freq: Every morning | ORAL | Status: DC
  Administered 2024-03-20 – 2024-03-26 (×7): 0.4 mg via ORAL
  Filled 2024-03-20 (×7): qty 1

## 2024-03-20 MED ORDER — CHLORDIAZEPOXIDE HCL 5 MG PO CAPS: 25.0000 mg | ORAL_CAPSULE | ORAL | Status: DC

## 2024-03-20 MED ORDER — LOPERAMIDE HCL 2 MG PO CAPS: 2.0000 mg | ORAL_CAPSULE | ORAL | Status: AC | PRN

## 2024-03-20 MED ORDER — METOPROLOL TARTRATE 50 MG PO TABS
50.0000 mg | ORAL_TABLET | Freq: Two times a day (BID) | ORAL | Status: DC
  Administered 2024-03-20 – 2024-03-26 (×12): 50 mg via ORAL
  Filled 2024-03-20 (×13): qty 1

## 2024-03-20 MED ORDER — CLONIDINE HCL 0.1 MG PO TABS
0.1000 mg | ORAL_TABLET | Freq: Two times a day (BID) | ORAL | Status: DC
  Administered 2024-03-20 – 2024-03-26 (×13): 0.1 mg via ORAL
  Filled 2024-03-20 (×13): qty 1

## 2024-03-20 MED ORDER — HYDROXYZINE HCL 25 MG PO TABS
25.0000 mg | ORAL_TABLET | Freq: Four times a day (QID) | ORAL | Status: AC | PRN
  Administered 2024-03-22: 25 mg via ORAL
  Filled 2024-03-20: qty 1

## 2024-03-20 MED ORDER — GABAPENTIN 300 MG PO CAPS
300.0000 mg | ORAL_CAPSULE | Freq: Every day | ORAL | Status: DC
  Administered 2024-03-20 – 2024-03-25 (×6): 300 mg via ORAL
  Filled 2024-03-20 (×6): qty 1

## 2024-03-20 MED ORDER — CHLORDIAZEPOXIDE HCL 5 MG PO CAPS
25.0000 mg | ORAL_CAPSULE | Freq: Three times a day (TID) | ORAL | Status: DC
  Administered 2024-03-21: 25 mg via ORAL
  Filled 2024-03-20: qty 5

## 2024-03-20 MED ORDER — QUETIAPINE FUMARATE 100 MG PO TABS
300.0000 mg | ORAL_TABLET | Freq: Every day | ORAL | Status: DC
  Administered 2024-03-20 – 2024-03-25 (×6): 300 mg via ORAL
  Filled 2024-03-20 (×6): qty 3

## 2024-03-20 NOTE — H&P (Addendum)
 History and Physical  Ruben [name] FMW:[id number] DOB: 1950-08-10 DOA: 03/19/2024  Referring physician: Dr. Franklyn, EDP  PCP: Ruben Prentice SAUNDERS, FNP  Outpatient Specialists: Cardiology, behavioral health. Patient coming from: Home.  Chief Complaint: Alcohol  withdrawal.  HPI: Ruben [name] is a 73 y.o. male with medical history significant for alcoholism, BPH, chronic anxiety/depression, aortic atherosclerosis, hypertension, hyperlipidemia, prediabetes, who presents to the ER with desire to quit alcohol  abuse.  States he has been admitted to alcohol  rehab twice this year in [place] for 30 days each time.  He is interested in sobriety.  Last alcohol  intake was early this morning at home after waking up.  States he drank about 12 ounces of vodka.  In the ER, intoxicated with alcohol  level of 203, significantly hypertensive, tachycardic, and tachypneic, with concern for early alcohol  withdrawal.  The patient received IV fluid bolus LR x 1 L, IV antiemetics, Zofran , and CIWA protocol was initiated in the ER.  [facility], hospitalist service, was asked to admit for further management of alcohol  withdrawal.    ED Course: Temp 97.8.  BP 175/110, pulse 125, respiratory rate 24, O2 saturation 98% on room air.  Review of Systems: Review of systems as noted in the HPI. All other systems reviewed and are negative.   Past Medical History:  Diagnosis Date   Alcoholism (HCC)    Anxiety    Aortic atherosclerosis 12/26/2016   Noted on CT   Arthritis    BPH (benign prostatic hyperplasia)    Chronic constipation    takes magnesium    Depression    Duodenal ulcer    Dyspnea 12/17/2017   ED (erectile dysfunction)    GERD (gastroesophageal reflux disease)    Heart palpitations    Occ   Hiatal hernia    History of adenomatous polyp of colon    tubular adenoma's 2005   History of Helicobacter pylori infection    2008   Hypercholesteremia    Hypertension    MR (mitral regurgitation)    Mild, noted on  ECHO   Palpitations    Pre-diabetes    Pulmonary nodule 12/26/2016   noted on CT, 5 x 3 mm nodular opacity right upper lobe   Rosacea    Schatzki's ring    last dilated 02/ 2016   Substance abuse (HCC)    Urinary retention    history of   Varicocele 02/25/2014   Small to moderate left varicocele   Past Surgical History:  Procedure Laterality Date   BALLOON DILATION N/A 06/08/2014   Procedure: BALLOON DILATION;  Surgeon: Gladis MARLA Louder, MD;  Location: [facility];  Service: Endoscopy;  Laterality: N/A;   COLONOSCOPY WITH PROPOFOL  N/A 06/08/2014   Procedure: COLONOSCOPY WITH PROPOFOL ;  Surgeon: Gladis MARLA Louder, MD;  Location: [facility];  Service: Endoscopy;  Laterality: N/A;   ESOPHAGOGASTRODUODENOSCOPY N/A 06/08/2014   Procedure: ESOPHAGOGASTRODUODENOSCOPY (EGD);  Surgeon: Gladis MARLA Louder, MD;  Location: [facility];  Service: Endoscopy;  Laterality: N/A;   ESOPHAGOGASTRODUODENOSCOPY (EGD) WITH ESOPHAGEAL DILATION N/A 12/31/2012   Procedure: ESOPHAGOGASTRODUODENOSCOPY (EGD) WITH ESOPHAGEAL DILATION;  Surgeon: Gladis MARLA Louder, MD;  Location: [facility];  Service: Endoscopy;  Laterality: N/A;   GREEN LIGHT LASER TURP (TRANSURETHRAL RESECTION OF PROSTATE N/A 12/28/2015   Procedure: GREEN LIGHT LASER TURP (TRANSURETHRAL RESECTION OF PROSTATE;  Surgeon: Donnice Brooks, MD;  Location: [facility];  Service: Urology;  Laterality: N/A;   HERNIA REPAIR  04/2018   hiatal   LEFT HEART CATH AND CORONARY ANGIOGRAPHY N/A  05/16/2022   Procedure: LEFT HEART CATH AND CORONARY ANGIOGRAPHY;  Surgeon: Elmira Newman PARAS, MD;  Location: [facility];  Service: Cardiovascular;  Laterality: N/A;    Social History:  reports that he has never smoked. He has never used smokeless tobacco. He reports current alcohol  use of about 154.0 standard drinks of alcohol  per week. He reports that he does not currently use drugs after having used the following drugs:  Marijuana.   Allergies[1]  Family History  Problem Relation Age of Onset   Heart failure Mother    Breast cancer Mother    Stroke Father    Other Father        Colon Issues   Lung disease Neg Hx       Prior to Admission medications  Medication Sig Start Date End Date Taking? Authorizing Provider  acetaminophen  (TYLENOL ) 325 MG tablet Take 2 tablets (650 mg total) by mouth every 6 (six) hours as needed for mild pain (pain score 1-3) or fever (or Fever >/= 101). 01/11/24   Sherrill Cable Latif, DO  albuterol  (VENTOLIN  HFA) 108 [phone] Base) MCG/ACT inhaler Inhale 2 puffs into the lungs every 6 (six) hours as needed for wheezing or shortness of breath. 05/24/23   [provider]  cloNIDine  (CATAPRES ) 0.1 MG tablet Take 1 tablet (0.1 mg total) by mouth 2 (two) times daily. 02/04/23   Gonfa, Taye T, MD  desvenlafaxine (PRISTIQ) 50 MG 24 hr tablet Take 50 mg by mouth as needed. Patient not taking: Reported on 01/11/2024 03/05/23   [provider]  finasteride  (PROSCAR ) 5 MG tablet Take 5 mg by mouth daily.    [provider]  folic acid  (FOLVITE ) 1 MG tablet Take 1 tablet (1 mg total) by mouth daily. 01/12/24   Sheikh, Cable Latif, DO  gabapentin  (NEURONTIN ) 300 MG capsule Take 300 mg by mouth at bedtime.    [provider]  hydrOXYzine  (ATARAX ) 50 MG tablet Take 50 mg by mouth at bedtime as needed for anxiety.    [provider]  lactulose  (CHRONULAC ) 10 GM/15ML solution Take 10 g by mouth every other day. 08/24/23   [provider]  lisinopril  (ZESTRIL ) 5 MG tablet Take 5 mg by mouth daily. Patient not taking: Reported on 01/11/2024 12/24/23   [provider]  metoprolol  tartrate (LOPRESSOR ) 50 MG tablet Take 50 mg by mouth 2 (two) times daily.    [provider]  mirtazapine  (REMERON ) 7.5 MG tablet Take 7.5 mg by mouth at bedtime as needed (for sleep and anxiety). Patient not taking: Reported on 01/11/2024 08/22/23   [provider]  Multiple Vitamin (MULTIVITAMIN WITH MINERALS) TABS tablet Take 1 tablet by mouth daily. 01/12/24   Sheikh, Omair Latif, DO  naltrexone (DEPADE) 50 MG tablet Take 50 mg by mouth 2 (two) times daily. 12/12/23   [provider]  ondansetron  (ZOFRAN ) 4 MG tablet Take 1 tablet (4 mg total) by mouth every 6 (six) hours as needed for nausea. 01/11/24   Sherrill Cable Latif, DO  QUEtiapine  (SEROQUEL ) 300 MG tablet Take 300 mg by mouth at bedtime. 12/02/22   [provider]  rosuvastatin  (CRESTOR ) 20 MG tablet Take 20 mg by mouth every Monday, Wednesday, and Friday. 08/23/21   [provider]  tamsulosin  (FLOMAX ) 0.4 MG CAPS capsule Take 0.4 mg by mouth in the morning.    [provider]  thiamine  (VITAMIN B-1) 100 MG tablet Take 1 tablet (100 mg total) by mouth daily. 01/11/24  Sheikh, Omair Latif, DO  traZODone  (DESYREL ) 50 MG tablet Take 50 mg by mouth at bedtime as needed for sleep. 10/25/23   [provider]    Physical Exam: BP (!) 133/47   Pulse 86   Temp 97.8 F (36.6 C) (Oral)   Resp 15   Ht 5' 11 (1.803 m)   Wt 93.9 kg   SpO2 98%   BMI 28.87 kg/m   General: 73 y.o. year-old male well developed well nourished in no acute distress.  Alert and oriented x3.  Tremulous. Cardiovascular: Tachycardic with no rubs or gallops.  No thyromegaly or JVD noted.  No lower extremity edema. 2/4 pulses in all 4 extremities. Respiratory: Clear to auscultation with no wheezes or rales. Good inspiratory effort. Abdomen: Soft nontender nondistended with normal bowel sounds x4 quadrants. Muskuloskeletal: No cyanosis, clubbing or edema noted bilaterally Neuro: CN II-XII intact, strength, sensation, reflexes Skin: No ulcerative lesions noted or rashes Psychiatry: Judgement and insight appear anxious. Mood is appropriate for condition and setting          Labs on Admission:  Basic Metabolic Panel: Recent Labs  Lab 03/19/24 1547 03/20/24 0034  NA 142   --   K 4.5  --   CL 104  --   CO2 18*  --   GLUCOSE 91  --   BUN 8  --   CREATININE 0.91 0.85  CALCIUM  8.6*  --   MG 2.1  --    Liver Function Tests: Recent Labs  Lab 03/19/24 1547  AST 29  ALT 18  ALKPHOS 83  BILITOT 0.4  PROT 7.2  ALBUMIN 3.9   No results for input(s): LIPASE, AMYLASE in the last 168 hours. No results for input(s): AMMONIA  in the last 168 hours. CBC: Recent Labs  Lab 03/19/24 1547 03/20/24 0034  WBC 7.3 5.7  NEUTROABS 4.0  --   HGB 16.4 13.6  HCT 49.3 40.9  MCV 91.3 91.9  PLT 224 154   Cardiac Enzymes: No results for input(s): CKTOTAL, CKMB, CKMBINDEX, TROPONINI in the last 168 hours.  BNP (last 3 results) Recent Labs    09/01/23 0838  BNP 43.5    ProBNP (last 3 results) No results for input(s): PROBNP in the last 8760 hours.  CBG: Recent Labs  Lab 03/19/24 1533  GLUCAP 100*    Radiological Exams on Admission: No results found.  EKG: I independently viewed the EKG done and my findings are as followed: Sinus tachycardia 155.  QTc 438.  Assessment/Plan Present on Admission:  Alcohol  withdrawal (HCC)  Principal Problem:   Alcohol  withdrawal (HCC)  Alcohol  intoxication with concern for early alcohol  withdrawal Presented with alcohol  level of 203, tremulous.   Last alcohol  intake was early this morning after waking up Significantly hypertensive, tachycardic, and tachypneic with concern for alcohol  withdrawal Continue CIWA protocol with IV Ativan  Continue multivitamins, folic acid , and thiamine  supplements Continue IV fluid hydration TOC consulted to provide resources for complete cessation of alcohol  use.  Hypertension Resume home regimen  Hyperlipidemia Resume home regimen  Hypothyroidism Resume home regimen  Chronic anxiety/depression Resume home regimen  BPH Resume home regimen Monitor urine output  Prediabetes Diet controlled Last A1c 6.0 in 2024.   Time: 75 minutes.   DVT  prophylaxis: Subcu Lovenox  daily.  Code Status: Full code.  Family Communication: None at bedside.  Disposition Plan: Admitted to stepdown unit due to high risk for delirium tremens..  Consults called: None.  Admission status: Inpatient status.   Status  is: Inpatient The patient requires at least 2 midnights for further evaluation and treatment of present condition.   Terry LOISE Hurst MD [facility] Pager [phone]  If 7PM-7AM, please contact night-coverage [email or web address] Password [email or web address]  03/20/2024, 6:14 AM      [1] No Known Allergies

## 2024-03-20 NOTE — Progress Notes (Signed)
 [facility] PLAN OF CARE NOTE Patient: Ruben [name] FMW:[id number]   PCP: [name], FNP DOB: [birth year]   DOA: 03/19/2024   DOS: 03/20/2024    Patient was admitted by my colleague earlier on 03/20/2024. I have reviewed the H&P as well as assessment and plan and agree with the same. Important changes in the plan are listed below.  Plan of care: Principal Problem:   Alcohol  withdrawal (HCC) Patient's tachycardia most likely related to his missing out on his home clonidine  and metoprolol . Will resume this medication. For alcohol  withdrawal I will switch the patient to Librium . Given his improvement in CIWA score I will transfer the patient out of the stepdown unit. Patient will continue to pursue outpatient alcohol  rehabilitation. Most likely should be able to stable for discharge home tomorrow as long as no events overnight.  Level of care: Telemetry Switching from stepdown to telemetry.  Author: Yetta Blanch, MD  [facility] 03/20/2024 5:37 PM   If 7PM-7AM, please contact night-coverage at [email or web address]

## 2024-03-21 LAB — BASIC METABOLIC PANEL WITH GFR
Anion gap: 11 (ref 5–15)
BUN: 12 mg/dL (ref 8–23)
CO2: 24 mmol/L (ref 22–32)
Calcium: 8.7 mg/dL — ABNORMAL LOW (ref 8.9–10.3)
Chloride: 102 mmol/L (ref 98–111)
Creatinine, Ser: 1.03 mg/dL (ref 0.61–1.24)
GFR, Estimated: >60 mL/min (ref >60)
Glucose, Bld: 115 mg/dL — ABNORMAL HIGH (ref 70–99)
Potassium: 4.1 mmol/L (ref 3.5–5.1)
Sodium: 138 mmol/L (ref 135–145)

## 2024-03-21 LAB — MAGNESIUM: Magnesium: 1.8 mg/dL (ref 1.7–2.4)

## 2024-03-21 MED ORDER — CHLORDIAZEPOXIDE HCL 5 MG PO CAPS
25.0000 mg | ORAL_CAPSULE | Freq: Three times a day (TID) | ORAL | Status: AC
  Administered 2024-03-21 (×2): 25 mg via ORAL
  Filled 2024-03-21 (×2): qty 5

## 2024-03-21 MED ORDER — CHLORDIAZEPOXIDE HCL 5 MG PO CAPS: 25.0000 mg | ORAL_CAPSULE | Freq: Two times a day (BID) | ORAL | Status: DC

## 2024-03-21 MED ORDER — CHLORDIAZEPOXIDE HCL 5 MG PO CAPS: 25.0000 mg | ORAL_CAPSULE | Freq: Every day | ORAL | Status: DC

## 2024-03-21 MED ORDER — CHLORDIAZEPOXIDE HCL 5 MG PO CAPS: 25.0000 mg | ORAL_CAPSULE | ORAL | Status: DC

## 2024-03-21 MED ORDER — CHLORDIAZEPOXIDE HCL 5 MG PO CAPS
25.0000 mg | ORAL_CAPSULE | Freq: Two times a day (BID) | ORAL | Status: DC
  Filled 2024-03-21: qty 5

## 2024-03-21 MED ORDER — LACTATED RINGERS IV SOLN: INTRAVENOUS | Status: DC

## 2024-03-21 NOTE — Progress Notes (Signed)
 [facility] Progress Note Patient: Ruben [name] FMW:[id number] DOB: 1951/02/05  DOA: 03/19/2024 DOS: the patient was seen and examined on 03/21/2024  Brief Hospital Course: Patient with PMH of BPH, anxiety, HTN, alcohol  abuse presented to hospital with complaints of withdrawals. Patient has history of alcohol  abuse.  Has gone alcohol  rehab. Started drinking again. Last drink was on 12/11. Normally drinks 12 ounce of vodka on a daily basis. Alcohol  level is significantly elevated patient still undergoing withdrawal.  Assessment and Plan: Alcohol  withdrawal. Started on CIWA protocol. Currently on Librium . Upon my evaluation on 12/12 patient is significantly drowsy in the afternoon. On reevaluation at 5:50 PM, patient is alert awake and does not show any evidence of tremors or agitation or headache or anxiety. But staff reports in between and the patient is significantly agitated trying to get out of the bed and has pulled out his IV multiple times. Staff thinks that the patient was drowsy after receiving IV Ativan . At present I am going to continue Librium  which was initiated for withdrawal. Minimize IV Ativan . Monitor overnight.  Mood disorder. Anxiety. Home regimen includes gabapentin . Reportedly the patient is also supposed to be on Remeron , trazodone , desvenlafaxine but does not take this medication. Also on clonidine  which he takes on a daily basis. For now monitor.   HTN. Blood pressure stable. Metoprolol  and clonidine . Continue the same for now.   Subjective: As above, patient is intermittently drowsy and agitated.  Denies any acute complaint.  No nausea or vomiting.  No chest pain.  No headache.  Physical Exam: General: in Mild distress, No Rash Cardiovascular: S1 and S2 Present, No Murmur Respiratory: Good respiratory effort, Bilateral Air entry present. No Crackles, No wheezes Abdomen: Bowel Sound present, No tenderness Extremities: No edema Neuro: Alert and  oriented x3, no new focal deficit   Data Reviewed: I have Reviewed nursing notes, Vitals, and Lab results. Since last encounter, pertinent lab results CBC BMP   .   Disposition: Status is: Inpatient Remains inpatient appropriate because: Monitor for improvement in mentation.  enoxaparin  (LOVENOX ) injection 40 mg Start: 03/19/24 2200   Family Communication: no one at bedside Level of care: Telemetry   Vitals:   03/21/24 1231 03/21/24 1358 03/21/24 1638 03/21/24 1700  BP: 121/70 127/78 (!) 130/90   Pulse: 65 69 67 81  Resp: 16     Temp: 98.3 F (36.8 C)   98 F (36.7 C)  TempSrc:   Oral Oral  SpO2: 95%  95%   Weight:      Height:         Author: Yetta Blanch, MD 03/21/2024 6:02 PM  Please look on [email or web address] to find out who is on call.

## 2024-03-21 NOTE — Plan of Care (Signed)
°  Problem: Education: Goal: Knowledge of General Education information will improve Description: Including pain rating scale, medication(s)/side effects and non-pharmacologic comfort measures Outcome: Progressing   Problem: Health Behavior/Discharge Planning: Goal: Ability to manage health-related needs will improve Outcome: Progressing   Problem: Clinical Measurements: Goal: Ability to maintain clinical measurements within normal limits will improve Outcome: Progressing Goal: Will remain free from infection Outcome: Progressing Goal: Diagnostic test results will improve Outcome: Progressing   Problem: Activity: Goal: Risk for activity intolerance will decrease Outcome: Progressing   Problem: Nutrition: Goal: Adequate nutrition will be maintained Outcome: Progressing   Problem: Coping: Goal: Level of anxiety will decrease Outcome: Progressing   Problem: Safety: Goal: Ability to remain free from injury will improve Outcome: Progressing   Problem: Health Behavior/Discharge Planning: Goal: Ability to identify changes in lifestyle to reduce recurrence of condition will improve Outcome: Progressing Goal: Identification of resources available to assist in meeting health care needs will improve Outcome: Progressing   Problem: Physical Regulation: Goal: Complications related to the disease process, condition or treatment will be avoided or minimized Outcome: Progressing

## 2024-03-21 NOTE — Plan of Care (Signed)
  Problem: Education: Goal: Knowledge of General Education information will improve Description: Including pain rating scale, medication(s)/side effects and non-pharmacologic comfort measures Outcome: Progressing   Problem: Health Behavior/Discharge Planning: Goal: Ability to manage health-related needs will improve Outcome: Progressing   Problem: Clinical Measurements: Goal: Ability to maintain clinical measurements within normal limits will improve Outcome: Progressing Goal: Will remain free from infection Outcome: Progressing Goal: Diagnostic test results will improve Outcome: Progressing Goal: Respiratory complications will improve Outcome: Progressing Goal: Cardiovascular complication will be avoided Outcome: Progressing   Problem: Activity: Goal: Risk for activity intolerance will decrease Outcome: Progressing   Problem: Nutrition: Goal: Adequate nutrition will be maintained Outcome: Progressing   Problem: Coping: Goal: Level of anxiety will decrease Outcome: Progressing   Problem: Elimination: Goal: Will not experience complications related to bowel motility Outcome: Progressing Goal: Will not experience complications related to urinary retention Outcome: Progressing   Problem: Pain Managment: Goal: General experience of comfort will improve and/or be controlled Outcome: Progressing   Problem: Safety: Goal: Ability to remain free from injury will improve Outcome: Progressing   Problem: Skin Integrity: Goal: Risk for impaired skin integrity will decrease Outcome: Progressing   Problem: Education: Goal: Knowledge of disease or condition will improve Outcome: Progressing Goal: Understanding of discharge needs will improve Outcome: Progressing   Problem: Health Behavior/Discharge Planning: Goal: Ability to identify changes in lifestyle to reduce recurrence of condition will improve Outcome: Progressing Goal: Identification of resources available to  assist in meeting health care needs will improve Outcome: Progressing   Problem: Physical Regulation: Goal: Complications related to the disease process, condition or treatment will be avoided or minimized Outcome: Progressing   Problem: Safety: Goal: Ability to remain free from injury will improve Outcome: Progressing

## 2024-03-21 NOTE — Hospital Course (Signed)
 Patient with PMH of BPH, anxiety, HTN, alcohol  abuse presented to hospital with complaints of withdrawals. Patient has history of alcohol  abuse.  Has gone alcohol  rehab. Started drinking again. Last drink was on 12/11. Normally drinks 12 ounce of vodka on a daily basis. Alcohol  level is significantly elevated patient still undergoing withdrawal.  Assessment and Plan: Alcohol  withdrawal. Started on CIWA protocol. Was on Librium . Intermittently agitated versus drowsy.  Difficult to control symptoms with medication.  Currently continuing only as needed Ativan .  But need to monitor for improvement in mentation for improvement in oral intake overnight.  Mood disorder. Anxiety. Home regimen includes gabapentin . Reportedly the patient is also supposed to be on Remeron , trazodone , desvenlafaxine but does not take this medication. Also on clonidine  which he takes on a daily basis.  Also continue Seroquel . For now monitor.  HTN. Blood pressure stable. Metoprolol  and clonidine . Continue the same for now.  BPH. Continue home medication.  Chronic neuropathy. Continue gabapentin .

## 2024-03-21 NOTE — TOC Progression Note (Signed)
 Transition of Care [facility]) - Progression Note    Patient Details  Name: REINER LOEWEN MRN: [id number] Date of Birth: [birth year]  [facility] CM/SW Contact  Sonda Manuella Quill, RN Phone Number: 03/21/2024, 4:41 PM  Clinical Narrative:    IP CM acknowledges consult for SA counseling/education; pt disoriented; no family at bedside; unable to complete assessment; will follow up w/ pt/family.                     Expected Discharge Plan and Services                                               Social Drivers of Health (SDOH) Interventions SDOH Screenings   Food Insecurity: No Food Insecurity (03/19/2024)  Housing: Low Risk (03/19/2024)  Transportation Needs: No Transportation Needs (03/19/2024)  Utilities: Not At Risk (03/19/2024)  Social Connections: Socially Isolated (03/19/2024)  Tobacco Use: Low Risk (03/19/2024)    Readmission Risk Interventions    01/11/2024   11:54 AM 07/09/2023    3:45 PM 03/23/2023   10:01 AM  Readmission Risk Prevention Plan  Transportation Screening Complete Complete Complete  PCP or Specialist Appt within 3-5 Days   Complete  HRI or Home Care Consult   Complete  Social Work Consult for Recovery Care Planning/Counseling   Complete  Palliative Care Screening   Not Applicable  Medication Review [employer]) Complete Complete Complete  PCP or Specialist appointment within 3-5 days of discharge  Complete   HRI or Home Care Consult Complete Complete   SW Recovery Care/Counseling Consult Complete Complete   Palliative Care Screening Not Applicable Not Applicable   Skilled Nursing Facility Not Applicable Not Applicable

## 2024-03-22 NOTE — Plan of Care (Signed)
  Problem: Pain Managment: Goal: General experience of comfort will improve and/or be controlled Outcome: Progressing   Problem: Safety: Goal: Ability to remain free from injury will improve Outcome: Progressing   Problem: Skin Integrity: Goal: Risk for impaired skin integrity will decrease Outcome: Progressing

## 2024-03-22 NOTE — Progress Notes (Signed)
 [facility] Progress Note Patient: Ruben [name] FMW:[id number] DOB: 09-02-1950  DOA: 03/19/2024 DOS: the patient was seen and examined on 03/22/2024  Brief Hospital Course: Patient with PMH of BPH, anxiety, HTN, alcohol  abuse presented to hospital with complaints of withdrawals. Patient has history of alcohol  abuse.  Has gone alcohol  rehab. Started drinking again. Last drink was on 12/11. Normally drinks 12 ounce of vodka on a daily basis. Alcohol  level is significantly elevated patient still undergoing withdrawal.  Assessment and Plan: Alcohol  withdrawal. Started on CIWA protocol. Was on Librium . Intermittently agitated versus drowsy.  Difficult to control symptoms with medication.  Currently continuing only as needed Ativan .  But need to monitor for improvement in mentation for improvement in oral intake overnight.  Mood disorder. Anxiety. Home regimen includes gabapentin . Reportedly the patient is also supposed to be on Remeron , trazodone , desvenlafaxine but does not take this medication. Also on clonidine  which he takes on a daily basis.  Also continue Seroquel . For now monitor.  HTN. Blood pressure stable. Metoprolol  and clonidine . Continue the same for now.  BPH. Continue home medication.  Chronic neuropathy. Continue gabapentin .   Subjective: No nausea no vomiting.  Was drowsy at the time of my evaluation initially and later on RN reports that patient was agitated and tried to get out of bed and later on again upon reevaluation patient is sleepy.  Physical Exam: Clear to auscultation. S1-S2 present No asterixis but tremors present. Bowel sound present.  Data Reviewed: I have Reviewed nursing notes, Vitals, and Lab results. Since last encounter, pertinent lab results CBC and BMP   . I have ordered test including CBC and BMP  .   Disposition: Status is: Inpatient Remains inpatient appropriate because: Monitor for improvement in mentation.  enoxaparin   (LOVENOX ) injection 40 mg Start: 03/19/24 2200   Family Communication: No one at bedside Level of care: Telemetry   Vitals:   03/22/24 0947 03/22/24 1007 03/22/24 1313 03/22/24 1655  BP: 113/75 127/71 127/78 119/85  Pulse: 82 80 68 67  Resp: 15  17 16   Temp: 99.1 F (37.3 C)  98.6 F (37 C) 98.9 F (37.2 C)  TempSrc:      SpO2: 94%  95% 90%  Weight:      Height:         Author: Yetta Blanch, MD 03/22/2024 6:07 PM  Please look on [email or web address] to find out who is on call.

## 2024-03-23 LAB — BASIC METABOLIC PANEL WITH GFR
Anion gap: 11 (ref 5–15)
BUN: 8 mg/dL (ref 8–23)
CO2: 23 mmol/L (ref 22–32)
Calcium: 8.8 mg/dL — ABNORMAL LOW (ref 8.9–10.3)
Chloride: 104 mmol/L (ref 98–111)
Creatinine, Ser: 0.97 mg/dL (ref 0.61–1.24)
GFR, Estimated: >60 mL/min (ref >60)
Glucose, Bld: 105 mg/dL — ABNORMAL HIGH (ref 70–99)
Potassium: 3.8 mmol/L (ref 3.5–5.1)
Sodium: 138 mmol/L (ref 135–145)

## 2024-03-23 LAB — MAGNESIUM: Magnesium: 2.1 mg/dL (ref 1.7–2.4)

## 2024-03-23 MED ORDER — HYDROXYZINE HCL 25 MG PO TABS
25.0000 mg | ORAL_TABLET | Freq: Four times a day (QID) | ORAL | Status: DC | PRN
  Administered 2024-03-23 – 2024-03-25 (×3): 25 mg via ORAL
  Filled 2024-03-23 (×3): qty 1

## 2024-03-23 NOTE — Plan of Care (Signed)
°  Problem: Education: Goal: Knowledge of General Education information will improve Description: Including pain rating scale, medication(s)/side effects and non-pharmacologic comfort measures Outcome: Progressing   Problem: Health Behavior/Discharge Planning: Goal: Ability to manage health-related needs will improve Outcome: Progressing   Problem: Clinical Measurements: Goal: Will remain free from infection Outcome: Progressing Goal: Respiratory complications will improve Outcome: Progressing   Problem: Coping: Goal: Level of anxiety will decrease Outcome: Progressing   Problem: Pain Managment: Goal: General experience of comfort will improve and/or be controlled Outcome: Progressing   Problem: Safety: Goal: Ability to remain free from injury will improve Outcome: Progressing   Problem: Skin Integrity: Goal: Risk for impaired skin integrity will decrease Outcome: Progressing   Problem: Safety: Goal: Ability to remain free from injury will improve Outcome: Progressing

## 2024-03-23 NOTE — TOC Initial Note (Signed)
 Transition of Care [facility]) - Initial/Assessment Note    Patient Details  Name: Ruben [name] MRN: [id number] Date of Birth: 07/30/1950  [facility] CM/SW Contact:    Sonda Manuella Quill, RN Phone Number: 03/23/2024, 11:14 AM  Clinical Narrative:                 Consult acknowledged for SA; spoke w/ pt in room; pt said he lives at home; he plans to return at d/c w/ support from his family; pt identified POC Rowena Daring (cousin) [phone]; he will arrange transportation; insurance/PCP verified; pt denied SDOH risks; he has walker; he does not have HH services or home oxygen; pt declined resources for SA counseling/education; he said will be seeing a psychiatrist; awaiting PT eval; IP CM is following.  Expected Discharge Plan: Home/Self Care Barriers to Discharge: Continued Medical Work up   Patient Goals and CMS Choice Patient states their goals for this hospitalization and ongoing recovery are:: HOME          Expected Discharge Plan and Services   Discharge Planning Services: CM Consult   Living arrangements for the past 2 months: Single Family Home                 DME Arranged: N/A DME Agency: NA       HH Arranged: NA HH Agency: NA        Prior Living Arrangements/Services Living arrangements for the past 2 months: Single Family Home Lives with:: Self Patient language and need for interpreter reviewed:: Yes Do you feel safe going back to the place where you live?: Yes      Need for Family Participation in Patient Care: Yes (Comment) Care giver support system in place?: Yes (comment) Current home services: DME (walker) Criminal Activity/Legal Involvement Pertinent to Current Situation/Hospitalization: No - Comment as needed  Activities of Daily Living   ADL Screening (condition at time of admission) Independently performs ADLs?: Yes (appropriate for developmental age) Is the patient deaf or have difficulty hearing?: No Does the patient have  difficulty seeing, even when wearing glasses/contacts?: No Does the patient have difficulty concentrating, remembering, or making decisions?: No  Permission Sought/Granted Permission sought to share information with : Case Manager Permission granted to share information with : Yes, Verbal Permission Granted  Share Information with NAME: Case Manager     Permission granted to share info w Relationship: Rowena Daring (cousin) [phone]     Emotional Assessment Appearance:: Appears stated age Attitude/Demeanor/Rapport: Gracious Affect (typically observed): Accepting Orientation: : Oriented to Self, Oriented to Place, Oriented to  Time, Oriented to Situation Alcohol  / Substance Use: Not Applicable Psych Involvement: No (comment)  Admission diagnosis:  Alcohol  withdrawal (HCC) [F10.939] Supraventricular arrhythmia [I49.9] Alcohol  withdrawal syndrome without complication (HCC) [F10.930] Alcoholic intoxication without complication [F10.920] Patient Active Problem List   Diagnosis Date Noted   UTI (urinary tract infection) 07/08/2023   Alcohol  dependence with withdrawal, unspecified (HCC) 04/11/2023   Depression    Alcoholic ketoacidosis 03/22/2023   Alcohol  withdrawal hallucinosis (HCC) 03/22/2023   Severe alcohol  withdrawal delirium (HCC) 02/27/2023   Lactic acid acidosis 02/26/2023   Encephalopathy acute 12/17/2022   Lactic acidosis 12/17/2022   Dehydration 12/17/2022   Acute lactic acidosis 12/17/2022   Abnormal stress test 05/11/2022   Elevated coronary artery calcium  score 05/11/2022   Aortic atherosclerosis 04/13/2022   Elevated LFTs    Acute kidney injury 09/08/2020   Hyponatremia 09/08/2020   Hyperbilirubinemia 09/08/2020   Sinus tachycardia 08/16/2020  Palpitations 08/16/2020   Exertional dyspnea 08/16/2020   Mild renal insufficiency 06/23/2020   Actinic keratosis 06/04/2020   Prediabetes 06/04/2020   MDD (major depressive disorder), recurrent episode, moderate  (HCC) 06/01/2020   Weight loss 04/08/2020   Unsteady gait 04/08/2020   Insomnia 04/08/2020   Lump of skin of left upper extremity 04/08/2020   Irregular heart beat 04/08/2020   Pure hypercholesterolemia 04/08/2020   Alcohol  use disorder, severe, dependence (HCC) 04/03/2020   Hyperlipidemia 04/03/2020   Suicidal ideation 04/03/2020   Metabolic acidosis 12/22/2019   Thrombocytopenia 12/22/2019   ARF (acute renal failure) 10/01/2019   Alcohol  withdrawal (HCC) 10/01/2019   Normochromic normocytic anemia 10/01/2019   GERD (gastroesophageal reflux disease) 06/22/2019   BPH (benign prostatic hyperplasia) 06/22/2019   Essential hypertension 06/22/2019   AKI (acute kidney injury)    Alcohol  intoxication 06/21/2019   Shoulder joint pain 03/31/2019   S/P Nissen fundoplication (without gastrostomy tube) procedure 04/26/2018   Rosacea 06/24/2011   PCP:  [name], FNP Pharmacy:   [id number] - [place], [place] - [address] [place] [address] Phone: [phone] Fax: [phone]  [place] [facility] [address] [place] [address] Phone: [phone] Fax: [phone]     Social Drivers of Health (SDOH) Social History: SDOH Screenings   Food Insecurity: No Food Insecurity (03/23/2024)  Housing: Low Risk (03/23/2024)  Transportation Needs: No Transportation Needs (03/23/2024)  Utilities: Not At Risk (03/23/2024)  Social Connections: Socially Isolated (03/19/2024)  Tobacco Use: Low Risk (03/19/2024)   SDOH Interventions: Food Insecurity Interventions: Intervention Not Indicated, Inpatient TOC Housing Interventions: Intervention Not Indicated, Inpatient TOC Transportation Interventions: Intervention Not Indicated, Inpatient TOC Utilities Interventions: Intervention Not Indicated, Inpatient TOC   Readmission Risk Interventions    01/11/2024   11:54 AM 07/09/2023    3:45 PM 03/23/2023   10:01 AM   Readmission Risk Prevention Plan  Transportation Screening Complete Complete Complete  PCP or Specialist Appt within 3-5 Days   Complete  HRI or Home Care Consult   Complete  Social Work Consult for Recovery Care Planning/Counseling   Complete  Palliative Care Screening   Not Applicable  Medication Review Oceanographer) Complete Complete Complete  PCP or Specialist appointment within 3-5 days of discharge  Complete   HRI or Home Care Consult Complete Complete   SW Recovery Care/Counseling Consult Complete Complete   Palliative Care Screening Not Applicable Not Applicable   Skilled Nursing Facility Not Applicable Not Applicable

## 2024-03-23 NOTE — Progress Notes (Signed)
 PROGRESS NOTE    Ruben [name]  FMW:[id number] DOB: Feb 08, 1951 DOA: 03/19/2024 PCP: [name], FNP   Brief Narrative:  73 year old male with history of BPH, anxiety, hypertension, alcohol  abuse presented with alcohol  withdrawal and was admitted for the same and started on CIWA protocol.  Assessment & Plan:   Alcohol  withdrawal -Continue CIWA protocol with multivitamin, thiamine  and folic acid .  Patient has had waxing and waning mental status with increasing drowsiness hence avoiding sedatives. - Counseled regarding abstinence from alcohol .  TOC consulted  Mood disorder Anxiety - Outpatient follow-up with psychiatry.  Continue gabapentin , quetiapine .  Reportedly, also on Remeron , trazodone  and desvenlafaxine but apparently does not take these medications. Continue clonidine   Hypertension - Blood pressure stable.  Continue metoprolol  and clonidine   BPH - Continue tamsulosin   Chronic neuropathy -Continue gabapentin   Physical deconditioning - PT eval    DVT prophylaxis: Lovenox  Code Status: Full Family Communication: None at bedside Disposition Plan: Status is: Inpatient Remains inpatient appropriate because: Of severity of illness    Consultants: None  Procedures: None  Antimicrobials: None   Subjective: Patient seen and examined at bedside.  Poor historian.  No fever, vomiting, agitation or seizures reported  Objective: Vitals:   03/22/24 1655 03/22/24 1952 03/22/24 2303 03/23/24 0509  BP: 119/85 133/74 117/73 121/66  Pulse: 67 64 66 60  Resp: 16 18  20   Temp: 98.9 F (37.2 C) 98.3 F (36.8 C)  97.6 F (36.4 C)  TempSrc:  Oral    SpO2: 90% 92%  96%  Weight:      Height:        Intake/Output Summary (Last 24 hours) at 03/23/2024 1124 Last data filed at 03/23/2024 0825 Gross per 24 hour  Intake 240 ml  Output 1550 ml  Net -1310 ml   Filed Weights   03/19/24 1527  Weight: 93.9 kg    Examination:  General exam: Appears calm and  comfortable.  Elderly male lying in bed. Respiratory system: Bilateral decreased breath sounds at bases, no wheezing Cardiovascular system: S1 & S2 heard, Rate controlled Gastrointestinal system: Abdomen is nondistended, soft and nontender. Normal bowel sounds heard. Extremities: No cyanosis, clubbing, edema  Central nervous system: Awake, slow to respond, poor historian, answers some questions.  No focal neurological deficits. Moving extremities Skin: No rashes, lesions or ulcers Psychiatry: Flat affect.  Not agitated.   Data Reviewed: I have personally reviewed following labs and imaging studies  CBC: Recent Labs  Lab 03/19/24 1547 03/20/24 0034  WBC 7.3 5.7  NEUTROABS 4.0  --   HGB 16.4 13.6  HCT 49.3 40.9  MCV 91.3 91.9  PLT 224 154   Basic Metabolic Panel: Recent Labs  Lab 03/19/24 1547 03/20/24 0034 03/21/24 0901 03/23/24 0717  NA 142  --  138 138  K 4.5  --  4.1 3.8  CL 104  --  102 104  CO2 18*  --  24 23  GLUCOSE 91  --  115* 105*  BUN 8  --  12 8  CREATININE 0.91 0.85 1.03 0.97  CALCIUM  8.6*  --  8.7* 8.8*  MG 2.1  --  1.8 2.1   GFR: Estimated Creatinine Clearance: 80.5 mL/min (by C-G formula based on SCr of 0.97 mg/dL). Liver Function Tests: Recent Labs  Lab 03/19/24 1547  AST 29  ALT 18  ALKPHOS 83  BILITOT 0.4  PROT 7.2  ALBUMIN 3.9   No results for input(s): LIPASE, AMYLASE in the last 168 hours. No results  for input(s): AMMONIA  in the last 168 hours. Coagulation Profile: No results for input(s): INR, PROTIME in the last 168 hours. Cardiac Enzymes: No results for input(s): CKTOTAL, CKMB, CKMBINDEX, TROPONINI in the last 168 hours. BNP (last 3 results) No results for input(s): PROBNP in the last 8760 hours. HbA1C: No results for input(s): HGBA1C in the last 72 hours. CBG: Recent Labs  Lab 03/19/24 1533  GLUCAP 100*   Lipid Profile: No results for input(s): CHOL, HDL, LDLCALC, TRIG, CHOLHDL, LDLDIRECT  in the last 72 hours. Thyroid  Function Tests: No results for input(s): TSH, T4TOTAL, FREET4, T3FREE, THYROIDAB in the last 72 hours. Anemia Panel: No results for input(s): VITAMINB12, FOLATE, FERRITIN, TIBC, IRON , RETICCTPCT in the last 72 hours. Sepsis Labs: No results for input(s): PROCALCITON, LATICACIDVEN in the last 168 hours.  Recent Results (from the past 240 hours)  MRSA Next Gen by PCR, Nasal     Status: None   Collection Time: 03/19/24 11:39 PM   Specimen: Nasal Mucosa; Nasal Swab  Result Value Ref Range Status   MRSA by PCR Next Gen NOT DETECTED NOT DETECTED Final    Comment: (NOTE) The GeneXpert MRSA Assay (FDA approved for NASAL specimens only), is one component of a comprehensive MRSA colonization surveillance program. It is not intended to diagnose MRSA infection nor to guide or monitor treatment for MRSA infections. Test performance is not FDA approved in patients less than 5 years old. Performed at [facility], [address] [place], [place] [address]          Radiology Studies: No results found.      Scheduled Meds:  Chlorhexidine  Gluconate Cloth  6 each Topical Q2200   cloNIDine   0.1 mg Oral BID   enoxaparin  (LOVENOX ) injection  40 mg Subcutaneous Q24H   finasteride   5 mg Oral Daily   folic acid   1 mg Oral Daily   gabapentin   300 mg Oral QHS   metoprolol  tartrate  50 mg Oral BID   multivitamin with minerals  1 tablet Oral Daily   QUEtiapine   300 mg Oral QHS   tamsulosin   0.4 mg Oral q AM   thiamine   100 mg Oral Daily   Or   thiamine   100 mg Intravenous Daily   Continuous Infusions:        Sophie Mao, MD [facility] 03/23/2024, 11:24 AM

## 2024-03-23 NOTE — Plan of Care (Signed)
  Problem: Education: Goal: Knowledge of General Education information will improve Description: Including pain rating scale, medication(s)/side effects and non-pharmacologic comfort measures Outcome: Progressing   Problem: Health Behavior/Discharge Planning: Goal: Ability to manage health-related needs will improve Outcome: Progressing   Problem: Clinical Measurements: Goal: Ability to maintain clinical measurements within normal limits will improve Outcome: Progressing   Problem: Elimination: Goal: Will not experience complications related to bowel motility Outcome: Progressing Goal: Will not experience complications related to urinary retention Outcome: Progressing   Problem: Safety: Goal: Ability to remain free from injury will improve Outcome: Progressing   Problem: Skin Integrity: Goal: Risk for impaired skin integrity will decrease Outcome: Progressing   

## 2024-03-23 NOTE — Evaluation (Signed)
 Physical Therapy Evaluation Patient Details Name: Ruben [name] MRN: [id number] DOB: 09-16-1950 Today's Date: 03/23/2024  History of Present Illness  73 yo male admitted with ETOH withdrawal. Hx of ETOH WD, ETOH use disorder, ETOH ketoacidosis, mood d/o  Clinical Impression  On eval, pt was CGA for mobility. He ambulated ~150 feet (with and without a RW). He participated well. He is unsteady but did not have an overt loss of balance during session. Pt feels he is not far from his baseline mobility level. Will recommend HHPT f/u. Recommend daily hallway ambulation with nursing and/or mobility team.       If plan is discharge home, recommend the following: A little help with walking and/or transfers;A little help with bathing/dressing/bathroom;Assistance with cooking/housework;Assist for transportation;Help with stairs or ramp for entrance   Can travel by private vehicle        Equipment Recommendations None recommended by PT  Recommendations for Other Services       Functional Status Assessment Patient has had a recent decline in their functional status and demonstrates the ability to make significant improvements in function in a reasonable and predictable amount of time.     Precautions / Restrictions Precautions Precautions: Fall Restrictions Weight Bearing Restrictions Per Provider Order: No      Mobility  Bed Mobility Overal bed mobility: Needs Assistance Bed Mobility: Supine to Sit     Supine to sit: Supervision          Transfers Overall transfer level: Needs assistance Equipment used: Rolling walker (2 wheels) Transfers: Sit to/from Stand Sit to Stand: Contact guard assist                Ambulation/Gait Ambulation/Gait assistance: Contact guard assist Gait Distance (Feet): 150 Feet Assistive device: Rolling walker (2 wheels), None Gait Pattern/deviations: Step-through pattern, Decreased stride length       General Gait Details: Unsteady but no  overt LOB. Walked 75 feet with a RW then 75 feet without a RW. Tolerated distance well.  Stairs            Wheelchair Mobility     Tilt Bed    Modified Rankin (Stroke Patients Only)       Balance Overall balance assessment: Needs assistance         Standing balance support: During functional activity Standing balance-Leahy Scale: Poor                               Pertinent Vitals/Pain Pain Assessment Pain Assessment: No/denies pain    Home Living Family/patient expects to be discharged to:: Private residence Living Arrangements: Alone Available Help at Discharge: Family;Available PRN/intermittently Type of Home: House Home Access: Stairs to enter Entrance Stairs-Rails: None Entrance Stairs-Number of Steps: 1   Home Layout: One level Home Equipment: Rollator (4 wheels);Shower seat      Prior Function Prior Level of Function : Independent/Modified Independent;Driving             Mobility Comments: reports ind at baseline. denies falls       Extremity/Trunk Assessment   Upper Extremity Assessment Upper Extremity Assessment: Overall WFL for tasks assessed    Lower Extremity Assessment Lower Extremity Assessment: Generalized weakness    Cervical / Trunk Assessment Cervical / Trunk Assessment: Normal  Communication   Communication Communication: No apparent difficulties    Cognition Arousal: Alert Behavior During Therapy: WFL for tasks assessed/performed   PT - Cognitive impairments: No apparent  impairments                         Following commands: Intact       Cueing Cueing Techniques: Verbal cues     General Comments      Exercises     Assessment/Plan    PT Assessment Patient needs continued PT services  PT Problem List Decreased strength;Decreased range of motion;Decreased activity tolerance;Decreased balance;Decreased mobility;Decreased knowledge of use of DME;Pain       PT Treatment  Interventions DME instruction;Gait training;Functional mobility training;Therapeutic activities;Therapeutic exercise;Patient/family education;Balance training    PT Goals (Current goals can be found in the Care Plan section)  Acute Rehab PT Goals Patient Stated Goal: get better PT Goal Formulation: With patient Time For Goal Achievement: 04/06/24 Potential to Achieve Goals: Good    Frequency       Co-evaluation               AM-PAC PT 6 Clicks Mobility  Outcome Measure Help needed turning from your back to your side while in a flat bed without using bedrails?: None Help needed moving from lying on your back to sitting on the side of a flat bed without using bedrails?: None Help needed moving to and from a bed to a chair (including a wheelchair)?: A Little Help needed standing up from a chair using your arms (e.g., wheelchair or bedside chair)?: A Little Help needed to walk in hospital room?: A Little Help needed climbing 3-5 steps with a railing? : A Little 6 Click Score: 20    End of Session Equipment Utilized During Treatment: Gait belt Activity Tolerance: Patient tolerated treatment well Patient left: in chair;with call bell/phone within reach Nurse Communication:  (chair alarm box not working-pt in chair) PT Visit Diagnosis: Muscle weakness (generalized) (M62.81);Difficulty in walking, not elsewhere classified (R26.2);Unsteadiness on feet (R26.81)    Time: 8855-8843 PT Time Calculation (min) (ACUTE ONLY): 12 min   Charges:   PT Evaluation $PT Eval Low Complexity: 1 Low   PT General Charges $$ ACUTE PT VISIT: 1 Visit            Dannial SQUIBB, PT Acute Rehabilitation  Office: [phone]

## 2024-03-24 ENCOUNTER — Other Ambulatory Visit (HOSPITAL_COMMUNITY): Payer: Self-pay

## 2024-03-24 DIAGNOSIS — F10930 Alcohol use, unspecified with withdrawal, uncomplicated: Secondary | ICD-10-CM | POA: Diagnosis not present

## 2024-03-24 MED ORDER — FOLIC ACID 1 MG PO TABS
1.0000 mg | ORAL_TABLET | Freq: Every day | ORAL | 0 refills | Status: AC
  Filled 2024-03-24: qty 30, 30d supply, fill #0

## 2024-03-24 MED ORDER — VITAMIN B-1 100 MG PO TABS
100.0000 mg | ORAL_TABLET | Freq: Every day | ORAL | 0 refills | Status: AC
  Filled 2024-03-24: qty 30, 30d supply, fill #0

## 2024-03-24 MED ORDER — POLYETHYLENE GLYCOL 3350 17 GM/SCOOP PO POWD
17.0000 g | Freq: Every day | ORAL | 0 refills | Status: AC | PRN
  Filled 2024-03-24: qty 238, 14d supply, fill #0

## 2024-03-24 MED ORDER — CLONIDINE HCL 0.1 MG PO TABS: 0.1000 mg | ORAL_TABLET | Freq: Every day | ORAL | Status: AC

## 2024-03-24 NOTE — Progress Notes (Signed)
 Discharge meds in a secure bag delivered to patient in room by this RN

## 2024-03-24 NOTE — TOC Progression Note (Signed)
 Transition of Care [facility]) - Progression Note   Patient Details  Name: Ruben [name] MRN: [id number] Date of Birth: [birth year]  Transition [facility]) CM/SW Contact  Duwaine GORMAN Aran, LCSW Phone Number: 03/24/2024, 11:36 AM  Clinical Narrative: PT evaluation recommended HHPT. CSW met with patient to discuss recommendation. Patient agreeable to being set up with HHPT. CSW made [facility] referral in hub. Care management awaiting offers.  Expected Discharge Plan: Home w Home Health Services Barriers to Discharge: Continued Medical Work up  Expected Discharge Plan and Services Discharge Planning Services: CM Consult Post Acute Care Choice: Home Health Living arrangements for the past 2 months: Single Family Home Expected Discharge Date: 03/24/24               DME Arranged: N/A DME Agency: NA  Social Drivers of Health (SDOH) Interventions SDOH Screenings   Food Insecurity: No Food Insecurity (03/23/2024)  Housing: Low Risk (03/23/2024)  Transportation Needs: No Transportation Needs (03/23/2024)  Utilities: Not At Risk (03/23/2024)  Social Connections: Socially Isolated (03/19/2024)  Tobacco Use: Low Risk (03/19/2024)   Readmission Risk Interventions    03/24/2024   11:35 AM 01/11/2024   11:54 AM 07/09/2023    3:45 PM  Readmission Risk Prevention Plan  Transportation Screening Complete Complete Complete  Medication Review Oceanographer) Complete Complete Complete  PCP or Specialist appointment within 3-5 days of discharge   Complete  HRI or Home Care Consult Complete Complete Complete  SW Recovery Care/Counseling Consult Complete Complete Complete  Palliative Care Screening Not Applicable Not Applicable Not Applicable  Skilled Nursing Facility Not Applicable Not Applicable Not Applicable

## 2024-03-24 NOTE — Progress Notes (Addendum)
 Physical Therapy Treatment Patient Details Name: IBRAHEM VOLKMAN MRN: [id number] DOB: September 17, 1950 Today's Date: 03/24/2024   History of Present Illness 73 yo male admitted with ETOH withdrawal. Hx of ETOH WD, ETOH use disorder, ETOH ketoacidosis, mood d/o    PT Comments  Pt agreeable to working with therapy. He required some increased assistance on today compared to yesterday. He demonstrated some poor safety awareness during session on today. He didn't appear to be clear cognitively-didn't remember which room was his, he didn't remember walking with me on yesterday-stated last time he walked was some time last week. He required increased cueing throughout session. I encouraged pt to use RW for ambulation safety until he returns to his baseline. Per chart review and brief chat with RN, pt has a discharge order in place. Pt does not appear ready to d/c home alone based on today's performance-will update RN and MD thru secure chat.     If plan is discharge home, recommend the following: A little help with walking and/or transfers;A little help with bathing/dressing/bathroom;Assistance with cooking/housework;Assist for transportation;Help with stairs or ramp for entrance   Can travel by private vehicle        Equipment Recommendations  None recommended by PT    Recommendations for Other Services       Precautions / Restrictions Precautions Precautions: Fall Restrictions Weight Bearing Restrictions Per Provider Order: No     Mobility  Bed Mobility Overal bed mobility: Needs Assistance Bed Mobility: Supine to Sit, Sit to Supine     Supine to sit: Contact guard, HOB elevated, Used rails Sit to supine: Contact guard assist, Used rails, HOB elevated   General bed mobility comments: Increased time. Effortful for pt with pt reaching out for assistance-encouraged pt to complete task on his own. Cues for technique.     Transfers Overall transfer level: Needs assistance Equipment used:  Rolling walker (2 wheels) Transfers: Sit to/from Stand Sit to Stand: Min assist           General transfer comment: Cues for safety, hand placement. Assist to steady. Had pt practice task x 5-pt bracing backs of LEs against bed and hands on thighs to aid himself.    Ambulation/Gait Ambulation/Gait assistance: Contact guard assist Gait Distance (Feet): 400 Feet Assistive device: Rolling walker (2 wheels), None Gait Pattern/deviations: Step-through pattern, Decreased stride length       General Gait Details: Unsteady but no overt LOB. Walked 100 feet with a RW then 300 feet without a RW. Tolerated distance well. CGA without device, Supv assist with RW   Stairs             Wheelchair Mobility     Tilt Bed    Modified Rankin (Stroke Patients Only)       Balance Overall balance assessment: Needs assistance   Sitting balance-Leahy Scale: Fair   Postural control: Posterior lean Standing balance support: During functional activity Standing balance-Leahy Scale: Fair Standing balance comment: Had pt perform static standing with EO/EC, narrow BOS, 360 degree turn, pick up object: Min A for narrow BOS, CGA for all other tasks. Increased time to complete turns-- indicating pt is likely at some risk for falls                            Communication Communication Communication: No apparent difficulties  Cognition Arousal: Alert Behavior During Therapy: WFL for tasks assessed/performed   PT - Cognitive impairments: No apparent impairments  Following commands: Intact      Cueing Cueing Techniques: Verbal cues  Exercises      General Comments        Pertinent Vitals/Pain Pain Assessment Pain Assessment: No/denies pain    Home Living                          Prior Function            PT Goals (current goals can now be found in the care plan section) Progress towards PT goals: Progressing toward  goals    Frequency    Min 3X/week      PT Plan      Co-evaluation              AM-PAC PT 6 Clicks Mobility   Outcome Measure  Help needed turning from your back to your side while in a flat bed without using bedrails?: None Help needed moving from lying on your back to sitting on the side of a flat bed without using bedrails?: None Help needed moving to and from a bed to a chair (including a wheelchair)?: A Little Help needed standing up from a chair using your arms (e.g., wheelchair or bedside chair)?: A Little Help needed to walk in hospital room?: A Little Help needed climbing 3-5 steps with a railing? : A Little 6 Click Score: 20    End of Session Equipment Utilized During Treatment: Gait belt Activity Tolerance: Patient tolerated treatment well Patient left: in bed;with call bell/phone within reach;with bed alarm set   PT Visit Diagnosis: Muscle weakness (generalized) (M62.81);Difficulty in walking, not elsewhere classified (R26.2);Unsteadiness on feet (R26.81)     Time: 9044-8980 PT Time Calculation (min) (ACUTE ONLY): 24 min  Charges:    $Gait Training: 23-37 mins PT General Charges $$ ACUTE PT VISIT: 1 Visit                        Dannial SQUIBB, PT Acute Rehabilitation  Office: [phone]

## 2024-03-24 NOTE — Discharge Summary (Signed)
 Physician Discharge Summary  Ruben [name] FMW:[id number] DOB: 03-21-1951 DOA: 03/19/2024  PCP: [name], FNP  Admit date: 03/19/2024 Discharge date: 03/24/2024  Admitted From: Home Disposition: Home  Recommendations for Outpatient Follow-up:  Follow up with PCP in 1 week with repeat CBC/BMP Outpatient follow-up with psychiatry Follow up in ED if symptoms worsen or new appear   Home Health: PT Equipment/Devices: None  Discharge Condition: Stable CODE STATUS: Full Diet recommendation: Heart healthy  Brief/Interim Summary: 73 year old male with history of BPH, anxiety, hypertension, alcohol  abuse presented with alcohol  withdrawal and was admitted for the same and started on CIWA protocol.  During the hospitalization, his condition has improved.  Patient had waxing and waning mental status with increasing drowsiness which led to holding sedatives.  Subsequently, his mental status has remained stable.  He feels okay to go home today.  He he has tolerated PT.  He will be discharged home today with outpatient follow-up with PCP and psychiatry.  Discharge Diagnoses:   Alcohol  withdrawal - Treated with CIWA protocol with multivitamin, thiamine  and folic acid .  Patient has had waxing and waning mental status with increasing drowsiness hence avoiding sedatives.  CIWA protocol has subsequently been discontinued. - Counseled regarding abstinence from alcohol .  TOC consulted -Subsequently, his mental status has remained stable.  He feels okay to go home today.  He he has tolerated PT.  He will be discharged home today with outpatient follow-up with PCP and psychiatry.   Mood disorder Anxiety - Outpatient follow-up with psychiatry.  Continue gabapentin , quetiapine .  Reportedly, also on Remeron , trazodone  and desvenlafaxine but apparently does not take these medications. -Continue clonidine  -Outpatient follow-up with psychiatry   Hypertension - Blood pressure stable.  Continue  metoprolol  and clonidine    BPH - Continue tamsulosin    Chronic neuropathy -Continue gabapentin    Physical deconditioning - PT recommended home health PT   Discharge Instructions  Discharge Instructions     Diet - low sodium heart healthy   Complete by: As directed    Increase activity slowly   Complete by: As directed       Allergies as of 03/24/2024   No Known Allergies      Medication List     STOP taking these medications    desvenlafaxine 50 MG 24 hr tablet Commonly known as: PRISTIQ   lactulose  10 GM/15ML solution Commonly known as: CHRONULAC    lisinopril  5 MG tablet Commonly known as: ZESTRIL    mirtazapine  7.5 MG tablet Commonly known as: REMERON    naltrexone 50 MG tablet Commonly known as: DEPADE   traZODone  50 MG tablet Commonly known as: DESYREL        TAKE these medications    acetaminophen  325 MG tablet Commonly known as: TYLENOL  Take 2 tablets (650 mg total) by mouth every 6 (six) hours as needed for mild pain (pain score 1-3) or fever (or Fever >/= 101).   albuterol  108 (90 Base) MCG/ACT inhaler Commonly known as: VENTOLIN  HFA Inhale 2 puffs into the lungs every 6 (six) hours as needed for wheezing or shortness of breath.   CertaVite/Antioxidants Tabs Take 1 tablet by mouth daily.   cloNIDine  0.1 MG tablet Commonly known as: CATAPRES  Take 1 tablet (0.1 mg total) by mouth daily.   finasteride  5 MG tablet Commonly known as: PROSCAR  Take 5 mg by mouth daily.   folic acid  1 MG tablet Commonly known as: FOLVITE  Take 1 tablet (1 mg total) by mouth daily.   gabapentin  300 MG capsule Commonly known as:  NEURONTIN  Take 300 mg by mouth at bedtime.   hydrOXYzine  50 MG tablet Commonly known as: ATARAX  Take 50 mg by mouth at bedtime as needed for anxiety.   metoprolol  tartrate 50 MG tablet Commonly known as: LOPRESSOR  Take 50 mg by mouth 2 (two) times daily.   ondansetron  4 MG tablet Commonly known as: ZOFRAN  Take 1 tablet (4  mg total) by mouth every 6 (six) hours as needed for nausea.   polyethylene glycol 17 g packet Commonly known as: MIRALAX  / GLYCOLAX  Take 17 g by mouth daily as needed for mild constipation.   QUEtiapine  300 MG tablet Commonly known as: SEROQUEL  Take 300 mg by mouth at bedtime.   rosuvastatin  20 MG tablet Commonly known as: CRESTOR  Take 20 mg by mouth every Monday, Wednesday, and Friday.   tamsulosin  0.4 MG Caps capsule Commonly known as: FLOMAX  Take 0.4 mg by mouth in the morning.   thiamine  100 MG tablet Commonly known as: Vitamin B-1 Take 1 tablet (100 mg total) by mouth daily.        Follow-up Information     [name], FNP. Schedule an appointment as soon as possible for a visit in 1 week(s).   Specialty: Family Medicine Contact information: [phone] [address] [place] [address] [phone]                Allergies[1]  Consultations: None   Procedures/Studies: No results found.    Subjective: Patient seen and examined at bedside.  Feels okay to go home today.  No fever, vomiting, seizures reported.  Discharge Exam: Vitals:   03/23/24 1951 03/24/24 0535  BP: 116/77 109/79  Pulse: 67 61  Resp: 18 20  Temp: 98.5 F (36.9 C) 97.6 F (36.4 C)  SpO2: 94% 98%    General: Pt is alert, awake, not in acute distress.  Elderly male lying in bed.  Poor historian.  Slow respond.  Answer some questions.  On room air. Cardiovascular: rate controlled, S1/S2 + Respiratory: bilateral decreased breath sounds at bases Abdominal: Soft, NT, ND, bowel sounds + Extremities: no edema, no cyanosis    The results of significant diagnostics from this hospitalization (including imaging, microbiology, ancillary and laboratory) are listed below for reference.     Microbiology: Recent Results (from the past 240 hours)  MRSA Next Gen by PCR, Nasal     Status: None   Collection Time: 03/19/24 11:39 PM   Specimen: Nasal Mucosa; Nasal Swab   Result Value Ref Range Status   MRSA by PCR Next Gen NOT DETECTED NOT DETECTED Final    Comment: (NOTE) The GeneXpert MRSA Assay (FDA approved for NASAL specimens only), is one component of a comprehensive MRSA colonization surveillance program. It is not intended to diagnose MRSA infection nor to guide or monitor treatment for MRSA infections. Test performance is not FDA approved in patients less than 67 years old. Performed at [facility], [address], [place], [place] [address]      Labs: BNP (last 3 results) Recent Labs    09/01/23 0838  BNP 43.5   Basic Metabolic Panel: Recent Labs  Lab 03/19/24 1547 03/20/24 0034 03/21/24 0901 03/23/24 0717  NA 142  --  138 138  K 4.5  --  4.1 3.8  CL 104  --  102 104  CO2 18*  --  24 23  GLUCOSE 91  --  115* 105*  BUN 8  --  12 8  CREATININE 0.91 0.85  1.03 0.97  CALCIUM  8.6*  --  8.7* 8.8*  MG 2.1  --  1.8 2.1   Liver Function Tests: Recent Labs  Lab 03/19/24 1547  AST 29  ALT 18  ALKPHOS 83  BILITOT 0.4  PROT 7.2  ALBUMIN 3.9   No results for input(s): LIPASE, AMYLASE in the last 168 hours. No results for input(s): AMMONIA  in the last 168 hours. CBC: Recent Labs  Lab 03/19/24 1547 03/20/24 0034  WBC 7.3 5.7  NEUTROABS 4.0  --   HGB 16.4 13.6  HCT 49.3 40.9  MCV 91.3 91.9  PLT 224 154   Cardiac Enzymes: No results for input(s): CKTOTAL, CKMB, CKMBINDEX, TROPONINI in the last 168 hours. BNP: Invalid input(s): POCBNP CBG: Recent Labs  Lab 03/19/24 1533  GLUCAP 100*   D-Dimer No results for input(s): DDIMER in the last 72 hours. Hgb A1c No results for input(s): HGBA1C in the last 72 hours. Lipid Profile No results for input(s): CHOL, HDL, LDLCALC, TRIG, CHOLHDL, LDLDIRECT in the last 72 hours. Thyroid  function studies No results for input(s): TSH, T4TOTAL, T3FREE, THYROIDAB in the last 72 hours.  Invalid input(s): FREET3 Anemia work  up No results for input(s): VITAMINB12, FOLATE, FERRITIN, TIBC, IRON , RETICCTPCT in the last 72 hours. Urinalysis    Component Value Date/Time   COLORURINE YELLOW 03/19/2024 1734   APPEARANCEUR CLEAR 03/19/2024 1734   LABSPEC 1.009 03/19/2024 1734   PHURINE 5.0 03/19/2024 1734   GLUCOSEU NEGATIVE 03/19/2024 1734   HGBUR SMALL (A) 03/19/2024 1734   BILIRUBINUR NEGATIVE 03/19/2024 1734   KETONESUR 5 (A) 03/19/2024 1734   PROTEINUR NEGATIVE 03/19/2024 1734   NITRITE NEGATIVE 03/19/2024 1734   LEUKOCYTESUR NEGATIVE 03/19/2024 1734   Sepsis Labs Recent Labs  Lab 03/19/24 1547 03/20/24 0034  WBC 7.3 5.7   Microbiology Recent Results (from the past 240 hours)  MRSA Next Gen by PCR, Nasal     Status: None   Collection Time: 03/19/24 11:39 PM   Specimen: Nasal Mucosa; Nasal Swab  Result Value Ref Range Status   MRSA by PCR Next Gen NOT DETECTED NOT DETECTED Final    Comment: (NOTE) The GeneXpert MRSA Assay (FDA approved for NASAL specimens only), is one component of a comprehensive MRSA colonization surveillance program. It is not intended to diagnose MRSA infection nor to guide or monitor treatment for MRSA infections. Test performance is not FDA approved in patients less than 64 years old. Performed at [facility], [address] [place], [place] [address]      Time coordinating discharge: 35 minutes  SIGNED:   Sophie Mao, MD  [facility] 03/24/2024, 10:09 AM      [1] No Known Allergies

## 2024-03-25 ENCOUNTER — Other Ambulatory Visit (HOSPITAL_COMMUNITY): Payer: Self-pay

## 2024-03-25 MED ORDER — LACTULOSE 10 GM/15ML PO SOLN
10.0000 g | Freq: Two times a day (BID) | ORAL | Status: DC
  Administered 2024-03-25 (×2): 10 g via ORAL
  Filled 2024-03-25 (×2): qty 15

## 2024-03-25 NOTE — Progress Notes (Signed)
 Patient was supposed to be discharged home close 03/24/2024 but discharge was held after patient looked very weak and was not cognitively intact during PT evaluation.  Patient feels slightly better today and feels that he might be ready to go home.  He will work with physical therapy again today.  If he does well, he will be discharged home today.  Patient seen and examined at bedside.  Please refer to the full discharge summary done by me on 20 1525 for full details.

## 2024-03-25 NOTE — Plan of Care (Signed)
  Problem: Education: Goal: Knowledge of General Education information will improve Description: Including pain rating scale, medication(s)/side effects and non-pharmacologic comfort measures Outcome: Progressing   Problem: Health Behavior/Discharge Planning: Goal: Ability to manage health-related needs will improve Outcome: Progressing   Problem: Clinical Measurements: Goal: Ability to maintain clinical measurements within normal limits will improve Outcome: Progressing Goal: Will remain free from infection Outcome: Progressing Goal: Diagnostic test results will improve Outcome: Progressing Goal: Respiratory complications will improve Outcome: Progressing Goal: Cardiovascular complication will be avoided Outcome: Progressing   Problem: Activity: Goal: Risk for activity intolerance will decrease Outcome: Progressing   Problem: Nutrition: Goal: Adequate nutrition will be maintained Outcome: Progressing   Problem: Coping: Goal: Level of anxiety will decrease Outcome: Progressing   Problem: Elimination: Goal: Will not experience complications related to bowel motility Outcome: Progressing Goal: Will not experience complications related to urinary retention Outcome: Progressing   Problem: Pain Managment: Goal: General experience of comfort will improve and/or be controlled Outcome: Progressing   Problem: Safety: Goal: Ability to remain free from injury will improve Outcome: Progressing   Problem: Skin Integrity: Goal: Risk for impaired skin integrity will decrease Outcome: Progressing   Problem: Education: Goal: Knowledge of disease or condition will improve Outcome: Progressing Goal: Understanding of discharge needs will improve Outcome: Progressing   Problem: Health Behavior/Discharge Planning: Goal: Ability to identify changes in lifestyle to reduce recurrence of condition will improve Outcome: Progressing Goal: Identification of resources available to  assist in meeting health care needs will improve Outcome: Progressing   Problem: Physical Regulation: Goal: Complications related to the disease process, condition or treatment will be avoided or minimized Outcome: Progressing   Problem: Safety: Goal: Ability to remain free from injury will improve Outcome: Progressing

## 2024-03-25 NOTE — Plan of Care (Signed)
°  Problem: Education: Goal: Knowledge of General Education information will improve Description: Including pain rating scale, medication(s)/side effects and non-pharmacologic comfort measures Outcome: Progressing   Problem: Clinical Measurements: Goal: Ability to maintain clinical measurements within normal limits will improve Outcome: Progressing Goal: Diagnostic test results will improve Outcome: Progressing   Problem: Activity: Goal: Risk for activity intolerance will decrease Outcome: Progressing   Problem: Nutrition: Goal: Adequate nutrition will be maintained Outcome: Progressing   Problem: Coping: Goal: Level of anxiety will decrease Outcome: Progressing   Problem: Elimination: Goal: Will not experience complications related to bowel motility Outcome: Progressing

## 2024-03-25 NOTE — Progress Notes (Signed)
 Physical Therapy Treatment Patient Details Name: Ruben [name] MRN: [id number] DOB: 1950/07/07 Today's Date: 03/25/2024   History of Present Illness 73 yo male admitted with ETOH withdrawal. Hx of ETOH WD, ETOH use disorder, ETOH ketoacidosis, mood d/o    PT Comments  Pt agreeable to session. Drowsy initially, with pt requiring time for increased alertness-alertness improved gradually throughout session. Pt participated with gait training, stair training, exercises, and balance tasks this session. He still exhibits risk for falls when mobilizing at times. He tolerated activity well. HR and O2 WNL throughout session. He denied dizziness except 1 spell when navigating stairs-pt reports he has issues with high heights at baseline. Encouraged him to sit up-he spends most of his time in bed/sleeping. I have asked mobility to see him to increase his activity. Discussed d/c plan-pt states he is considering going to stay with a cousin. He reports he has access to a RW. Continue to recommend HHPT f/u.    If plan is discharge home, recommend the following: A little help with walking and/or transfers;A little help with bathing/dressing/bathroom;Assistance with cooking/housework;Assist for transportation;Help with stairs or ramp for entrance   Can travel by private vehicle        Equipment Recommendations  None recommended by PT    Recommendations for Other Services       Precautions / Restrictions Precautions Precautions: Fall Restrictions Weight Bearing Restrictions Per Provider Order: No     Mobility  Bed Mobility Overal bed mobility: Needs Assistance Bed Mobility: Supine to Sit     Supine to sit: Supervision, HOB elevated, Used rails     General bed mobility comments: Increased time. Effortful for pt with pt reaching out for assistance-encouraged pt to complete task on his own-pt able with assist of railing.    Transfers Overall transfer level: Needs assistance Equipment used:  None Transfers: Sit to/from Stand Sit to Stand: Min assist           General transfer comment: Cues for safety, hand placement. Assist to steady. Stood at bedside for ~1 minute or so for pt to gain balance. Posterior bias with initial standing. Improved performance with repetitive sit to stands at end of session.    Ambulation/Gait Ambulation/Gait assistance: Contact guard assist Gait Distance (Feet): 400 Feet Assistive device: None Gait Pattern/deviations: Step-through pattern       General Gait Details: Unsteady but no overt LOB. Tolerated distance well. Cues for increased stride length.   Stairs Stairs: Yes Stairs assistance: Min assist Stair Management: Step to pattern, Forwards, One rail Right Number of Stairs: 5 General stair comments: Initially CGA but then pt had dizzy spell-reports he has issues with height (fearful). With time, able to make it back down without incident.   Wheelchair Mobility     Tilt Bed    Modified Rankin (Stroke Patients Only)       Balance Overall balance assessment: Needs assistance   Sitting balance-Leahy Scale: Fair Sitting balance - Comments: Posterior leaning with initial sitting EOB-pt able to gain balance with time Postural control: Posterior lean Standing balance support: During functional activity Standing balance-Leahy Scale: Fair               High level balance activites: Backward walking, Turns High Level Balance Comments: Side steps, backwards walking (with use of hallway handrail vs no UE support). Intermittent increased posterior leaning. Had pt walk at fast pace-no LOB.            Communication Communication Communication: No apparent difficulties  Cognition Arousal: Alert Behavior During Therapy: WFL for tasks assessed/performed   PT - Cognitive impairments: No apparent impairments                         Following commands: Intact      Cueing Cueing Techniques: Verbal cues   Exercises Total Joint Exercises Marching in Standing: AROM, Both, 10 reps, Standing (high knee) Other Exercises Other Exercises: Sit to stand x 15 reps CGA    General Comments        Pertinent Vitals/Pain Pain Assessment Pain Assessment: No/denies pain    Home Living                          Prior Function            PT Goals (current goals can now be found in the care plan section) Progress towards PT goals: Progressing toward goals    Frequency    Min 3X/week      PT Plan      Co-evaluation              AM-PAC PT 6 Clicks Mobility   Outcome Measure  Help needed turning from your back to your side while in a flat bed without using bedrails?: None Help needed moving from lying on your back to sitting on the side of a flat bed without using bedrails?: None Help needed moving to and from a bed to a chair (including a wheelchair)?: A Little Help needed standing up from a chair using your arms (e.g., wheelchair or bedside chair)?: A Little Help needed to walk in hospital room?: A Little Help needed climbing 3-5 steps with a railing? : A Little 6 Click Score: 20    End of Session Equipment Utilized During Treatment: Gait belt Activity Tolerance: Patient tolerated treatment well Patient left: in chair;with call bell/phone within reach Nurse Communication:  (chair pad set up, box not working-made nursing aware during 1st PT session as well) PT Visit Diagnosis: Muscle weakness (generalized) (M62.81);Difficulty in walking, not elsewhere classified (R26.2);Unsteadiness on feet (R26.81)     Time: 8887-8857 PT Time Calculation (min) (ACUTE ONLY): 30 min  Charges:    $Gait Training: 8-22 mins $Neuromuscular Re-education: 8-22 mins PT General Charges $$ ACUTE PT VISIT: 1 Visit                        Dannial SQUIBB, PT Acute Rehabilitation  Office: [phone]

## 2024-03-25 NOTE — Plan of Care (Signed)
°  Problem: Education: Goal: Knowledge of General Education information will improve Description: Including pain rating scale, medication(s)/side effects and non-pharmacologic comfort measures Outcome: Progressing   Problem: Safety: Goal: Ability to remain free from injury will improve Outcome: Progressing   Problem: Skin Integrity: Goal: Risk for impaired skin integrity will decrease Outcome: Progressing   Problem: Education: Goal: Knowledge of disease or condition will improve Outcome: Progressing Goal: Understanding of discharge needs will improve Outcome: Progressing

## 2024-03-26 DIAGNOSIS — F10930 Alcohol use, unspecified with withdrawal, uncomplicated: Secondary | ICD-10-CM | POA: Diagnosis not present

## 2024-03-26 LAB — CBC WITH DIFFERENTIAL/PLATELET
Abs Immature Granulocytes: 0.01 K/uL (ref 0.00–0.07)
Basophils Absolute: 0 K/uL (ref 0.0–0.1)
Basophils Relative: 1 %
Eosinophils Absolute: 0.4 K/uL (ref 0.0–0.5)
Eosinophils Relative: 7 %
HCT: 43.7 % (ref 39.0–52.0)
Hemoglobin: 14.5 g/dL (ref 13.0–17.0)
Immature Granulocytes: 0 %
Lymphocytes Relative: 37 %
Lymphs Abs: 1.9 K/uL (ref 0.7–4.0)
MCH: 30.5 pg (ref 26.0–34.0)
MCHC: 33.2 g/dL (ref 30.0–36.0)
MCV: 92 fL (ref 80.0–100.0)
Monocytes Absolute: 0.7 K/uL (ref 0.1–1.0)
Monocytes Relative: 13 %
Neutro Abs: 2.2 K/uL (ref 1.7–7.7)
Neutrophils Relative %: 42 %
Platelets: 142 K/uL — ABNORMAL LOW (ref 150–400)
RBC: 4.75 MIL/uL (ref 4.22–5.81)
RDW: 13.7 % (ref 11.5–15.5)
WBC: 5.3 K/uL (ref 4.0–10.5)
nRBC: 0 % (ref 0.0–0.2)

## 2024-03-26 LAB — COMPREHENSIVE METABOLIC PANEL WITH GFR
ALT: 13 U/L (ref 0–44)
AST: 19 U/L (ref 15–41)
Albumin: 3.4 g/dL — ABNORMAL LOW (ref 3.5–5.0)
Alkaline Phosphatase: 65 U/L (ref 38–126)
Anion gap: 10 (ref 5–15)
BUN: 13 mg/dL (ref 8–23)
CO2: 23 mmol/L (ref 22–32)
Calcium: 8.8 mg/dL — ABNORMAL LOW (ref 8.9–10.3)
Chloride: 103 mmol/L (ref 98–111)
Creatinine, Ser: 1.17 mg/dL (ref 0.61–1.24)
GFR, Estimated: >60 mL/min (ref >60)
Glucose, Bld: 111 mg/dL — ABNORMAL HIGH (ref 70–99)
Potassium: 4.1 mmol/L (ref 3.5–5.1)
Sodium: 136 mmol/L (ref 135–145)
Total Bilirubin: 0.3 mg/dL (ref 0.0–1.2)
Total Protein: 6.3 g/dL — ABNORMAL LOW (ref 6.5–8.1)

## 2024-03-26 LAB — AMMONIA: Ammonia: 37 umol/L — ABNORMAL HIGH (ref 9–35)

## 2024-03-26 LAB — MAGNESIUM: Magnesium: 2.2 mg/dL (ref 1.7–2.4)

## 2024-03-26 MED ORDER — LACTULOSE 10 GM/15ML PO SOLN
20.0000 g | Freq: Two times a day (BID) | ORAL | Status: DC
  Filled 2024-03-26: qty 30

## 2024-03-26 NOTE — TOC Transition Note (Signed)
 Transition of Care [facility]) - Discharge Note   Patient Details  Name: Ruben [name] MRN: [id number] Date of Birth: [birth year]  [facility] CM/SW Contact:  NORMAN ASPEN, LCSW Phone Number: 03/26/2024, 10:33 AM   Clinical Narrative:     Pt medically cleared for dc home today.  Have  secured HHPT coverage with [facility] and agency info placed on chart.  No further IP CM needs.  Final next level of care: Home w Home Health Services Barriers to Discharge: Barriers Resolved   Patient Goals and CMS Choice Patient states their goals for this hospitalization and ongoing recovery are:: HOME CMS Medicare.gov Compare Post Acute Care list provided to:: Patient Choice offered to / list presented to : Patient      Discharge Placement                       Discharge Plan and Services Additional resources added to the After Visit Summary for     Discharge Planning Services: CM Consult Post Acute Care Choice: Home Health          DME Arranged: N/A DME Agency: NA       HH Arranged: PT HH Agency: [facility] Date [facility] Agency Contacted: 03/26/24 Time HH Agency Contacted: 1032 Representative spoke with at [facility] Agency: accepted on [employer]  Social Drivers of Health (SDOH) Interventions SDOH Screenings   Food Insecurity: No Food Insecurity (03/23/2024)  Housing: Low Risk (03/23/2024)  Transportation Needs: No Transportation Needs (03/23/2024)  Utilities: Not At Risk (03/23/2024)  Social Connections: Socially Isolated (03/19/2024)  Tobacco Use: Low Risk (03/19/2024)     Readmission Risk Interventions    03/24/2024   11:35 AM 01/11/2024   11:54 AM 07/09/2023    3:45 PM  Readmission Risk Prevention Plan  Transportation Screening Complete Complete Complete  Medication Review [employer]) Complete Complete Complete  PCP or Specialist appointment within 3-5 days of discharge   Complete  HRI or Home Care Consult Complete Complete Complete  SW Recovery  Care/Counseling Consult Complete Complete Complete  Palliative Care Screening Not Applicable Not Applicable Not Applicable  Skilled Nursing Facility Not Applicable Not Applicable Not Applicable

## 2024-03-26 NOTE — Discharge Summary (Signed)
 Physician Discharge Summary  Ruben [name] FMW:[id number] DOB: 11/24/1950 DOA: 03/19/2024  PCP: [name], FNP  Admit date: 03/19/2024 Discharge date: 03/26/2024  Admitted From: Home Disposition: Home  Recommendations for Outpatient Follow-up:  Follow up with PCP in 1 week with repeat CBC/BMP Outpatient follow-up with psychiatry Follow up in ED if symptoms worsen or new appear   Home Health: PT Equipment/Devices: None  Discharge Condition: Stable CODE STATUS: Full Diet recommendation: Heart healthy  Brief/Interim Summary: 73 year old male with history of BPH, anxiety, hypertension, alcohol  abuse presented with alcohol  withdrawal and was admitted for the same and started on CIWA protocol.  During the hospitalization, his condition has improved.  Patient had waxing and waning mental status with increasing drowsiness which led to holding sedatives.  Patient was supposed to be discharged home close 03/24/2024 but discharge was held after patient looked very weak and was not cognitively intact during PT evaluation.  Subsequently, his mental status has remained stable.  He feels okay to go home today.  He will be discharged home today with outpatient follow-up with PCP and psychiatry.  Discharge Diagnoses:   Alcohol  withdrawal - Treated with CIWA protocol with multivitamin, thiamine  and folic acid .  Patient has had waxing and waning mental status with increasing drowsiness hence avoiding sedatives.  CIWA protocol has subsequently been discontinued. - Counseled regarding abstinence from alcohol .  TOC consulted -Subsequently, his mental status has remained stable.  He feels okay to go home today.  He he has tolerated PT.  He will be discharged home today with outpatient follow-up with PCP and psychiatry.   Mood disorder Anxiety - Outpatient follow-up with psychiatry.  Continue gabapentin , quetiapine .  Reportedly, also on Remeron , trazodone  and desvenlafaxine but apparently does not  take these medications. -Continue clonidine  -Outpatient follow-up with psychiatry   Hypertension - Blood pressure stable.  Continue metoprolol  and clonidine    BPH - Continue tamsulosin    Chronic neuropathy -Continue gabapentin    Physical deconditioning - PT recommended home health PT   Discharge Instructions  Discharge Instructions     Diet - low sodium heart healthy   Complete by: As directed    Increase activity slowly   Complete by: As directed       Allergies as of 03/26/2024   No Known Allergies      Medication List     STOP taking these medications    desvenlafaxine 50 MG 24 hr tablet Commonly known as: PRISTIQ   lactulose  10 GM/15ML solution Commonly known as: CHRONULAC    lisinopril  5 MG tablet Commonly known as: ZESTRIL    mirtazapine  7.5 MG tablet Commonly known as: REMERON    naltrexone 50 MG tablet Commonly known as: DEPADE   traZODone  50 MG tablet Commonly known as: DESYREL        TAKE these medications    acetaminophen  325 MG tablet Commonly known as: TYLENOL  Take 2 tablets (650 mg total) by mouth every 6 (six) hours as needed for mild pain (pain score 1-3) or fever (or Fever >/= 101).   albuterol  108 (90 Base) MCG/ACT inhaler Commonly known as: VENTOLIN  HFA Inhale 2 puffs into the lungs every 6 (six) hours as needed for wheezing or shortness of breath.   CertaVite/Antioxidants Tabs Take 1 tablet by mouth daily.   cloNIDine  0.1 MG tablet Commonly known as: CATAPRES  Take 1 tablet (0.1 mg total) by mouth daily.   finasteride  5 MG tablet Commonly known as: PROSCAR  Take 5 mg by mouth daily.   folic acid  1 MG tablet Commonly  known as: FOLVITE  Take 1 tablet (1 mg total) by mouth daily.   gabapentin  300 MG capsule Commonly known as: NEURONTIN  Take 300 mg by mouth at bedtime.   hydrOXYzine  50 MG tablet Commonly known as: ATARAX  Take 50 mg by mouth at bedtime as needed for anxiety.   metoprolol  tartrate 50 MG  tablet Commonly known as: LOPRESSOR  Take 50 mg by mouth 2 (two) times daily.   ondansetron  4 MG tablet Commonly known as: ZOFRAN  Take 1 tablet (4 mg total) by mouth every 6 (six) hours as needed for nausea.   polyethylene glycol powder 17 GM/SCOOP powder Commonly known as: GLYCOLAX /MIRALAX  Take 17 g by mouth daily as needed for mild constipation. Dissolve 1 capful (17g) in 4-8 ounces of liquid and take by mouth daily.   QUEtiapine  300 MG tablet Commonly known as: SEROQUEL  Take 300 mg by mouth at bedtime.   rosuvastatin  20 MG tablet Commonly known as: CRESTOR  Take 20 mg by mouth every Monday, Wednesday, and Friday.   tamsulosin  0.4 MG Caps capsule Commonly known as: FLOMAX  Take 0.4 mg by mouth in the morning.   thiamine  100 MG tablet Commonly known as: VITAMIN B1 Take 1 tablet (100 mg total) by mouth daily.        Follow-up Information     [name], FNP. Schedule an appointment as soon as possible for a visit in 1 week(s).   Specialty: Family Medicine Contact information: [phone] [place] [address] [phone]                Allergies[1]  Consultations: None   Procedures/Studies: No results found.    Subjective: Patient seen and examined at bedside.  Feels okay to go home today.  No seizures, vomiting, agitation reported. Discharge Exam: Vitals:   03/25/24 1945 03/26/24 0524  BP: 115/78 101/71  Pulse: 85 (!) 58  Resp: 18 18  Temp: 98.1 F (36.7 C)   SpO2: 96% 95%    General: Awake, answers some questions.  Elderly male lying in bed.  Poor historian.  Still slow respond.  Remains on room air  cardiovascular: Mild intermittent bradycardia present; S1 and S2 are heard Respiratory: Decreased breath sounds at bases bilaterally with scattered crackles abdominal: Soft, NT, ND, bowel sounds + Extremities: No clubbing; mild lower extremity edema present   The results of significant diagnostics from this  hospitalization (including imaging, microbiology, ancillary and laboratory) are listed below for reference.     Microbiology: Recent Results (from the past 240 hours)  MRSA Next Gen by PCR, Nasal     Status: None   Collection Time: 03/19/24 11:39 PM   Specimen: Nasal Mucosa; Nasal Swab  Result Value Ref Range Status   MRSA by PCR Next Gen NOT DETECTED NOT DETECTED Final    Comment: (NOTE) The GeneXpert MRSA Assay (FDA approved for NASAL specimens only), is one component of a comprehensive MRSA colonization surveillance program. It is not intended to diagnose MRSA infection nor to guide or monitor treatment for MRSA infections. Test performance is not FDA approved in patients less than 52 years old. Performed at [facility], [address], [place], [place] [address]      Labs: BNP (last 3 results) Recent Labs    09/01/23 0838  BNP 43.5   Basic Metabolic Panel: Recent Labs  Lab 03/19/24 1547 03/20/24 0034 03/21/24 0901 03/23/24 0717 03/26/24 0605  NA 142  --  138 138 136  K 4.5  --  4.1 3.8 4.1  CL 104  --  102 104 103  CO2 18*  --  24 23 23   GLUCOSE 91  --  115* 105* 111*  BUN 8  --  12 8 13   CREATININE 0.91 0.85 1.03 0.97 1.17  CALCIUM  8.6*  --  8.7* 8.8* 8.8*  MG 2.1  --  1.8 2.1 2.2   Liver Function Tests: Recent Labs  Lab 03/19/24 1547 03/26/24 0605  AST 29 19  ALT 18 13  ALKPHOS 83 65  BILITOT 0.4 0.3  PROT 7.2 6.3*  ALBUMIN 3.9 3.4*   No results for input(s): LIPASE, AMYLASE in the last 168 hours. Recent Labs  Lab 03/26/24 0605  AMMONIA  37*   CBC: Recent Labs  Lab 03/19/24 1547 03/20/24 0034 03/26/24 0605  WBC 7.3 5.7 5.3  NEUTROABS 4.0  --  2.2  HGB 16.4 13.6 14.5  HCT 49.3 40.9 43.7  MCV 91.3 91.9 92.0  PLT 224 154 142*   Cardiac Enzymes: No results for input(s): CKTOTAL, CKMB, CKMBINDEX, TROPONINI in the last 168 hours. BNP: Invalid input(s): POCBNP CBG: Recent Labs  Lab 03/19/24 1533   GLUCAP 100*   D-Dimer No results for input(s): DDIMER in the last 72 hours. Hgb A1c No results for input(s): HGBA1C in the last 72 hours. Lipid Profile No results for input(s): CHOL, HDL, LDLCALC, TRIG, CHOLHDL, LDLDIRECT in the last 72 hours. Thyroid  function studies No results for input(s): TSH, T4TOTAL, T3FREE, THYROIDAB in the last 72 hours.  Invalid input(s): FREET3 Anemia work up No results for input(s): VITAMINB12, FOLATE, FERRITIN, TIBC, IRON , RETICCTPCT in the last 72 hours. Urinalysis    Component Value Date/Time   COLORURINE YELLOW 03/19/2024 1734   APPEARANCEUR CLEAR 03/19/2024 1734   LABSPEC 1.009 03/19/2024 1734   PHURINE 5.0 03/19/2024 1734   GLUCOSEU NEGATIVE 03/19/2024 1734   HGBUR SMALL (A) 03/19/2024 1734   BILIRUBINUR NEGATIVE 03/19/2024 1734   KETONESUR 5 (A) 03/19/2024 1734   PROTEINUR NEGATIVE 03/19/2024 1734   NITRITE NEGATIVE 03/19/2024 1734   LEUKOCYTESUR NEGATIVE 03/19/2024 1734   Sepsis Labs Recent Labs  Lab 03/19/24 1547 03/20/24 0034 03/26/24 0605  WBC 7.3 5.7 5.3   Microbiology Recent Results (from the past 240 hours)  MRSA Next Gen by PCR, Nasal     Status: None   Collection Time: 03/19/24 11:39 PM   Specimen: Nasal Mucosa; Nasal Swab  Result Value Ref Range Status   MRSA by PCR Next Gen NOT DETECTED NOT DETECTED Final    Comment: (NOTE) The GeneXpert MRSA Assay (FDA approved for NASAL specimens only), is one component of a comprehensive MRSA colonization surveillance program. It is not intended to diagnose MRSA infection nor to guide or monitor treatment for MRSA infections. Test performance is not FDA approved in patients less than 52 years old. Performed at [facility], [address] [place], [place] [address]      Time coordinating discharge: 35 minutes  SIGNED:   Sophie Mao, MD  [facility] 03/26/2024, 10:05 AM       [1] No Known  Allergies
